# Patient Record
Sex: Female | Born: 1955 | Race: White | Hispanic: No | Smoking: Current every day smoker
Health system: Southern US, Community
[De-identification: ages and names within clinical notes are randomized; demographics above are authoritative.]

## PROBLEM LIST (undated history)

## (undated) DIAGNOSIS — D649 Anemia, unspecified: Secondary | ICD-10-CM

## (undated) DIAGNOSIS — J449 Chronic obstructive pulmonary disease, unspecified: Secondary | ICD-10-CM

## (undated) DIAGNOSIS — I2510 Atherosclerotic heart disease of native coronary artery without angina pectoris: Secondary | ICD-10-CM

## (undated) DIAGNOSIS — K219 Gastro-esophageal reflux disease without esophagitis: Secondary | ICD-10-CM

## (undated) DIAGNOSIS — I739 Peripheral vascular disease, unspecified: Secondary | ICD-10-CM

## (undated) DIAGNOSIS — F329 Major depressive disorder, single episode, unspecified: Secondary | ICD-10-CM

## (undated) DIAGNOSIS — E785 Hyperlipidemia, unspecified: Secondary | ICD-10-CM

## (undated) DIAGNOSIS — K2970 Gastritis, unspecified, without bleeding: Secondary | ICD-10-CM

## (undated) DIAGNOSIS — F32A Depression, unspecified: Secondary | ICD-10-CM

## (undated) DIAGNOSIS — F419 Anxiety disorder, unspecified: Secondary | ICD-10-CM

## (undated) DIAGNOSIS — I5032 Chronic diastolic (congestive) heart failure: Secondary | ICD-10-CM

## (undated) DIAGNOSIS — K649 Unspecified hemorrhoids: Secondary | ICD-10-CM

## (undated) DIAGNOSIS — I219 Acute myocardial infarction, unspecified: Secondary | ICD-10-CM

## (undated) DIAGNOSIS — I10 Essential (primary) hypertension: Secondary | ICD-10-CM

## (undated) DIAGNOSIS — I509 Heart failure, unspecified: Secondary | ICD-10-CM

## (undated) HISTORY — PX: APPENDECTOMY: SHX54

## (undated) HISTORY — PX: HIP FRACTURE SURGERY: SHX118

## (undated) HISTORY — DX: Heart failure, unspecified: I50.9

## (undated) HISTORY — DX: Chronic diastolic (congestive) heart failure: I50.32

## (undated) HISTORY — DX: Anemia, unspecified: D64.9

---

## 2007-05-01 ENCOUNTER — Other Ambulatory Visit: Payer: Self-pay

## 2007-05-01 ENCOUNTER — Inpatient Hospital Stay: Payer: Self-pay | Admitting: Internal Medicine

## 2008-07-08 DIAGNOSIS — I739 Peripheral vascular disease, unspecified: Secondary | ICD-10-CM

## 2008-07-08 HISTORY — PX: NM MYOVIEW LTD: HXRAD82

## 2008-07-08 HISTORY — DX: Peripheral vascular disease, unspecified: I73.9

## 2009-10-08 DIAGNOSIS — I2510 Atherosclerotic heart disease of native coronary artery without angina pectoris: Secondary | ICD-10-CM

## 2009-10-08 HISTORY — DX: Atherosclerotic heart disease of native coronary artery without angina pectoris: I25.10

## 2009-10-08 HISTORY — PX: LEFT HEART CATH AND CORONARY ANGIOGRAPHY: CATH118249

## 2009-11-07 HISTORY — PX: TRANSTHORACIC ECHOCARDIOGRAM: SHX275

## 2010-10-08 DIAGNOSIS — I6523 Occlusion and stenosis of bilateral carotid arteries: Secondary | ICD-10-CM

## 2010-10-08 HISTORY — DX: Occlusion and stenosis of bilateral carotid arteries: I65.23

## 2010-10-27 DIAGNOSIS — I771 Stricture of artery: Secondary | ICD-10-CM

## 2010-10-27 HISTORY — DX: Stricture of artery: I77.1

## 2011-03-28 HISTORY — PX: OTHER SURGICAL HISTORY: SHX169

## 2011-10-14 ENCOUNTER — Other Ambulatory Visit: Payer: Self-pay

## 2011-10-14 ENCOUNTER — Observation Stay (HOSPITAL_COMMUNITY)
Admission: EM | Admit: 2011-10-14 | Discharge: 2011-10-15 | Disposition: A | Discharge status: Home / Self Care | Payer: Medicaid Other | Attending: Internal Medicine | Admitting: Internal Medicine

## 2011-10-14 ENCOUNTER — Emergency Department (HOSPITAL_COMMUNITY): Payer: Medicaid Other

## 2011-10-14 ENCOUNTER — Encounter: Payer: Self-pay | Admitting: Emergency Medicine

## 2011-10-14 DIAGNOSIS — Z8249 Family history of ischemic heart disease and other diseases of the circulatory system: Secondary | ICD-10-CM | POA: Insufficient documentation

## 2011-10-14 DIAGNOSIS — J4489 Other specified chronic obstructive pulmonary disease: Secondary | ICD-10-CM | POA: Insufficient documentation

## 2011-10-14 DIAGNOSIS — E785 Hyperlipidemia, unspecified: Secondary | ICD-10-CM | POA: Insufficient documentation

## 2011-10-14 DIAGNOSIS — F17200 Nicotine dependence, unspecified, uncomplicated: Secondary | ICD-10-CM | POA: Insufficient documentation

## 2011-10-14 DIAGNOSIS — I739 Peripheral vascular disease, unspecified: Secondary | ICD-10-CM | POA: Insufficient documentation

## 2011-10-14 DIAGNOSIS — M79609 Pain in unspecified limb: Secondary | ICD-10-CM | POA: Insufficient documentation

## 2011-10-14 DIAGNOSIS — F32A Depression, unspecified: Secondary | ICD-10-CM | POA: Diagnosis present

## 2011-10-14 DIAGNOSIS — F341 Dysthymic disorder: Secondary | ICD-10-CM | POA: Insufficient documentation

## 2011-10-14 DIAGNOSIS — I200 Unstable angina: Principal | ICD-10-CM | POA: Insufficient documentation

## 2011-10-14 DIAGNOSIS — F329 Major depressive disorder, single episode, unspecified: Secondary | ICD-10-CM | POA: Diagnosis present

## 2011-10-14 DIAGNOSIS — Z23 Encounter for immunization: Secondary | ICD-10-CM | POA: Insufficient documentation

## 2011-10-14 DIAGNOSIS — J449 Chronic obstructive pulmonary disease, unspecified: Secondary | ICD-10-CM | POA: Diagnosis present

## 2011-10-14 DIAGNOSIS — I2510 Atherosclerotic heart disease of native coronary artery without angina pectoris: Secondary | ICD-10-CM | POA: Diagnosis present

## 2011-10-14 DIAGNOSIS — R0789 Other chest pain: Secondary | ICD-10-CM | POA: Insufficient documentation

## 2011-10-14 HISTORY — DX: Chronic obstructive pulmonary disease, unspecified: J44.9

## 2011-10-14 HISTORY — DX: Hyperlipidemia, unspecified: E78.5

## 2011-10-14 HISTORY — DX: Depression, unspecified: F32.A

## 2011-10-14 HISTORY — DX: Atherosclerotic heart disease of native coronary artery without angina pectoris: I25.10

## 2011-10-14 HISTORY — DX: Major depressive disorder, single episode, unspecified: F32.9

## 2011-10-14 HISTORY — DX: Peripheral vascular disease, unspecified: I73.9

## 2011-10-14 LAB — BASIC METABOLIC PANEL
Calcium: 10.1 mg/dL (ref 8.4–10.5)
Creatinine, Ser: 0.76 mg/dL (ref 0.50–1.10)
GFR calc Af Amer: >90 mL/min (ref >90)
GFR calc non Af Amer: >90 mL/min (ref >90)
Sodium: 136 mEq/L (ref 135–145)

## 2011-10-14 LAB — CBC
HCT: 38 % (ref 36.0–46.0)
MCH: 31.2 pg (ref 26.0–34.0)
MCHC: 33.9 g/dL (ref 30.0–36.0)
MCV: 91.8 fL (ref 78.0–100.0)
Platelets: 297 10*3/uL (ref 150–400)
RDW: 12.6 % (ref 11.5–15.5)
WBC: 10 10*3/uL (ref 4.0–10.5)

## 2011-10-14 LAB — POCT I-STAT TROPONIN I: Troponin i, poc: 0 ng/mL (ref 0.00–0.08)

## 2011-10-14 LAB — HEPATIC FUNCTION PANEL
ALT: 12 U/L (ref 0–35)
Alkaline Phosphatase: 95 U/L (ref 39–117)
Bilirubin, Direct: <0.1 mg/dL (ref 0.0–0.3)

## 2011-10-14 LAB — DIFFERENTIAL
Basophils Absolute: 0 10*3/uL (ref 0.0–0.1)
Basophils Relative: 0 % (ref 0–1)
Eosinophils Absolute: 0.2 10*3/uL (ref 0.0–0.7)
Eosinophils Relative: 2 % (ref 0–5)
Lymphs Abs: 3.2 10*3/uL (ref 0.7–4.0)

## 2011-10-14 MED ORDER — TRAZODONE HCL 50 MG PO TABS
50.0000 mg | ORAL_TABLET | Freq: Every day | ORAL | Status: DC
  Administered 2011-10-14: 50 mg via ORAL
  Filled 2011-10-14: qty 1

## 2011-10-14 MED ORDER — ONDANSETRON HCL 4 MG PO TABS: 4.0000 mg | ORAL_TABLET | Freq: Four times a day (QID) | ORAL | Status: DC | PRN

## 2011-10-14 MED ORDER — NICOTINE 14 MG/24HR TD PT24
14.0000 mg | MEDICATED_PATCH | Freq: Every day | TRANSDERMAL | Status: DC
  Administered 2011-10-15: 14 mg via TRANSDERMAL
  Filled 2011-10-14: qty 1

## 2011-10-14 MED ORDER — AMLODIPINE BESYLATE 5 MG PO TABS
5.0000 mg | ORAL_TABLET | Freq: Every day | ORAL | Status: DC
Start: 2011-10-15 — End: 2011-10-15
  Administered 2011-10-15: 5 mg via ORAL
  Filled 2011-10-14: qty 1

## 2011-10-14 MED ORDER — FOLIC ACID 1 MG PO TABS
1.0000 mg | ORAL_TABLET | Freq: Every day | ORAL | Status: DC
  Administered 2011-10-15: 1 mg via ORAL
  Filled 2011-10-14: qty 1

## 2011-10-14 MED ORDER — DOCUSATE SODIUM 100 MG PO CAPS: 100.0000 mg | ORAL_CAPSULE | Freq: Two times a day (BID) | ORAL | Status: DC | PRN

## 2011-10-14 MED ORDER — LISINOPRIL 10 MG PO TABS
20.0000 mg | ORAL_TABLET | Freq: Every day | ORAL | Status: DC
  Administered 2011-10-15: 20 mg via ORAL
  Filled 2011-10-14: qty 2

## 2011-10-14 MED ORDER — CARVEDILOL 12.5 MG PO TABS
12.5000 mg | ORAL_TABLET | Freq: Two times a day (BID) | ORAL | Status: DC
  Administered 2011-10-14 – 2011-10-15 (×2): 12.5 mg via ORAL
  Filled 2011-10-14 (×2): qty 1

## 2011-10-14 MED ORDER — ACETAMINOPHEN 325 MG PO TABS: 650.0000 mg | ORAL_TABLET | Freq: Four times a day (QID) | ORAL | Status: DC | PRN

## 2011-10-14 MED ORDER — ENOXAPARIN SODIUM 40 MG/0.4ML ~~LOC~~ SOLN
40.0000 mg | Freq: Every day | SUBCUTANEOUS | Status: DC
  Administered 2011-10-14: 40 mg via SUBCUTANEOUS
  Filled 2011-10-14: qty 0.4

## 2011-10-14 MED ORDER — OXYCODONE HCL 5 MG PO TABS
5.0000 mg | ORAL_TABLET | ORAL | Status: DC | PRN
  Administered 2011-10-14 – 2011-10-15 (×3): 5 mg via ORAL
  Filled 2011-10-14 (×3): qty 1

## 2011-10-14 MED ORDER — OXYCODONE-ACETAMINOPHEN 5-325 MG PO TABS
1.0000 | ORAL_TABLET | Freq: Once | ORAL | Status: AC
  Administered 2011-10-14: 1 via ORAL
  Filled 2011-10-14: qty 1

## 2011-10-14 MED ORDER — TRAZODONE HCL 50 MG PO TABS: 25.0000 mg | ORAL_TABLET | Freq: Every evening | ORAL | Status: DC | PRN

## 2011-10-14 MED ORDER — DIAZEPAM 5 MG PO TABS
5.0000 mg | ORAL_TABLET | Freq: Three times a day (TID) | ORAL | Status: DC | PRN
  Administered 2011-10-14 – 2011-10-15 (×2): 5 mg via ORAL
  Filled 2011-10-14 (×2): qty 1

## 2011-10-14 MED ORDER — PNEUMOCOCCAL VAC POLYVALENT 25 MCG/0.5ML IJ INJ
0.5000 mL | INJECTION | INTRAMUSCULAR | Status: AC
  Administered 2011-10-15: 0.5 mL via INTRAMUSCULAR
  Filled 2011-10-14: qty 0.5

## 2011-10-14 MED ORDER — ONDANSETRON HCL 4 MG/2ML IJ SOLN: 4.0000 mg | Freq: Four times a day (QID) | INTRAMUSCULAR | Status: DC | PRN

## 2011-10-14 MED ORDER — NITROGLYCERIN 0.4 MG SL SUBL
0.4000 mg | SUBLINGUAL_TABLET | Freq: Once | SUBLINGUAL | Status: AC
  Administered 2011-10-14: 0.4 mg via SUBLINGUAL
  Filled 2011-10-14: qty 25

## 2011-10-14 MED ORDER — SIMVASTATIN 20 MG PO TABS: 20.0000 mg | ORAL_TABLET | Freq: Every day | ORAL | Status: DC

## 2011-10-14 MED ORDER — HYDROMORPHONE HCL PF 1 MG/ML IJ SOLN: 0.5000 mg | INTRAMUSCULAR | Status: DC | PRN

## 2011-10-14 MED ORDER — ASPIRIN 81 MG PO CHEW
324.0000 mg | CHEWABLE_TABLET | Freq: Once | ORAL | Status: AC
  Administered 2011-10-14: 324 mg via ORAL
  Filled 2011-10-14: qty 4

## 2011-10-14 MED ORDER — ASPIRIN EC 81 MG PO TBEC
81.0000 mg | DELAYED_RELEASE_TABLET | Freq: Every day | ORAL | Status: DC
  Administered 2011-10-15: 81 mg via ORAL
  Filled 2011-10-14: qty 1

## 2011-10-14 MED ORDER — BISACODYL 10 MG RE SUPP: 10.0000 mg | RECTAL | Status: DC | PRN

## 2011-10-14 MED ORDER — ALBUTEROL SULFATE HFA 108 (90 BASE) MCG/ACT IN AERS: 2.0000 | INHALATION_SPRAY | Freq: Four times a day (QID) | RESPIRATORY_TRACT | Status: DC | PRN

## 2011-10-14 MED ORDER — FLEET ENEMA 7-19 GM/118ML RE ENEM: 1.0000 | ENEMA | RECTAL | Status: DC | PRN

## 2011-10-14 MED ORDER — ACETAMINOPHEN 650 MG RE SUPP: 650.0000 mg | Freq: Four times a day (QID) | RECTAL | Status: DC | PRN

## 2011-10-14 NOTE — H&P (Addendum)
PCP:  Dianna Rossetti, NP, [facility]  Cardiologist: Dr. Chales Abrahams at [facility]  Vascular surgeon: Dr. Fuller Canada at [facility]  Psychiatrist: Dr. Lynett Fish in [facility]  Chief Complaint:  Chest pain since today  HPI: Traci Duran is an 55 y.o. Caucasian female. History of coronary artery disease status post cardiac catheterization at [facility] 2 years ago, coronary artery stent was attempted but apparently failed, patient is on medical management. Patient also has a history of intermittent claudication and peripheral vascular disease and has been told that her arteries are too small for stenting.  Has been having pain in the left arm and hand for the past 2 weeks and was being treated unsuccessfully for tendinitis with oxycodone, then yesterday noticed that the pain came around her wrists and she suspects it may be cardiac pain, so took a sublingual nitroglycerin which gave prompt relief of the limb pain. Left upper extremity pain recurred later the same yesterday evening and she again took sublingual nitroglycerin which relieved the pain within about 5 minutes. n. this morning with left arm pain and chest pain and again it was relieved by nitroglycerin and when the chest pain occurred again later this evening she took nitroglycerin and then came to the emergency room. In the emergency room a chest pain almost completely resolved she received aspirin and more nitroglycerin and the pain resolved completely.  She denies any headache dizziness or lightheadedness she denies nausea vomiting or diaphoresis.  She does have COPD and recently has been using her albuterol inhaler, but says the pains are not related to the use of the inhaler. She has been unable to identify any other aggravating or relieving factors.  She continues to smoke cigarettes she is down from one pack per day to half a pack per day. She denies alcohol or illicit drug use.  She gets pains in her calf after  walking about 50 feet.   Past Medical History  Diagnosis Date  . Coronary artery disease   . PAD (peripheral artery disease)   . Depression   . COPD (chronic obstructive pulmonary disease)   . Hyperlipidemia     Past Surgical History  Procedure Date  . Appendectomy     Medications:  HOME MEDS: Prior to Admission medications   Medication Sig Start Date End Date Taking? Authorizing Provider  albuterol (VENTOLIN HFA) 108 (90 BASE) MCG/ACT inhaler Inhale 2 puffs into the lungs every 6 (six) hours as needed. For shortness of breath    Yes Historical Provider, MD  amLODipine (NORVASC) 5 MG tablet Take 5 mg by mouth daily.     Yes Historical Provider, MD  aspirin EC 81 MG tablet Take 81 mg by mouth daily.     Yes Historical Provider, MD  carvedilol (COREG) 12.5 MG tablet Take 12.5 mg by mouth 2 (two) times daily.     Yes Historical Provider, MD  diazepam (VALIUM) 5 MG tablet Take 5 mg by mouth 3 (three) times daily.     Yes Historical Provider, MD  gabapentin (NEURONTIN) 300 MG capsule Take 300 mg by mouth 3 (three) times daily.     Yes Historical Provider, MD  lisinopril (PRINIVIL,ZESTRIL) 20 MG tablet Take 20 mg by mouth daily.     Yes Historical Provider, MD  nicotine (NICODERM CQ - DOSED IN MG/24 HR) 7 mg/24hr patch Place 1 patch onto the skin daily.     Yes Historical Provider, MD  oxyCODONE-acetaminophen (PERCOCET) 5-325 MG per tablet Take 1  tablet by mouth every 6 (six) hours as needed. For pain    Yes Historical Provider, MD  simvastatin (ZOCOR) 20 MG tablet Take 20 mg by mouth daily.     Yes Historical Provider, MD  traZODone (DESYREL) 50 MG tablet Take 50 mg by mouth at bedtime.     Yes Historical Provider, MD    PRIOR TO AMDISSION MEDS Medications Prior to Admission  Medication Dose Route Frequency Provider Last Rate Last Dose  . aspirin chewable tablet 324 mg  324 mg Oral Once Joya Gaskins, MD   324 mg at 10/14/11 1827  . diazepam (VALIUM) tablet 5 mg  5 mg Oral Q8H  PRN Vania Rea   5 mg at 10/14/11 2133  . nitroGLYCERIN (NITROSTAT) SL tablet 0.4 mg  0.4 mg Sublingual Once Joya Gaskins, MD   0.4 mg at 10/14/11 1828  . oxyCODONE-acetaminophen (PERCOCET) 5-325 MG per tablet 1 tablet  1 tablet Oral Once Joya Gaskins, MD   1 tablet at 10/14/11 1938   No current outpatient prescriptions on file as of 10/14/2011.    Allergies:  Allergies  Allergen Reactions  . Benadryl (Altaryl) Rash    Social History:   reports that she has been smoking Cigarettes.  She has a 10 pack-year smoking history. She has never used smokeless tobacco. She reports that she drinks alcohol. She reports that she does not use illicit drugs.  Family History: Family History  Problem Relation Age of Onset  . Hypertension Mother   . Heart failure Mother   . Heart failure Father     Rewiew of Systems:  The patient denies anorexia, fever, weight loss,, vision loss, decreased hearing, hoarseness,  syncope, dyspnea on exertion, peripheral edema, balance deficits, hemoptysis, abdominal pain, melena, hematochezia, severe indigestion/heartburn, hematuria, incontinence, genital sores, muscle weakness, suspicious skin lesions, transient blindness, difficulty walking, depression, unusual weight change, abnormal bleeding, enlarged lymph nodes, angioedema, and breast masses.  Physical Exam: Filed Vitals:   10/14/11 1929 10/14/11 2116 10/14/11 2125 10/14/11 2149  BP: 129/79 180/110 183/95 158/84  Pulse: 61 68 69 60  Temp:   98 F (36.7 C)   TempSrc:   Oral   Resp: 20 15    Height:      Weight:      SpO2: 96% 98% 96%    Blood pressure 158/84, pulse 60, temperature 98 F (36.7 C), temperature source Oral, resp. rate 15, height 5\' 7"  (1.702 m), weight 59.875 kg (132 lb), SpO2 96.00%.  GEN: Pleasant middle-aged Caucasian lady lying in the stretcher in no acute distress; cooperative with exam PSYCH: He is alert and oriented x4; does appear somewhat anxious; does not appear  depressed; affect is normal HEENT: Mucous membranes pink and anicteric; PERRLA; EOM intact; no cervical lymphadenopathy nor thyromegaly ; no JVD; left carotid bruit Breasts:: Not examined CHEST WALL: No tenderness CHEST: Normal respiration, clear to auscultation bilaterally HEART: Regular rate and rhythm; 3/6 systolic murmu,r no gallops BACK: No kyphosis or scoliosis; no CVA tenderness ABDOMEN: Obese, soft non-tender; no masses, no organomegaly, normal abdominal bowel sounds; no pannus; no intertriginous candida. Rectal Exam: Not done EXTREMITIES: ; age-appropriate arthropathy of the hands and knees; no edema; no ulcerations. Genitalia: not examined PULSES: 2+ and symmetric SKIN: Normal hydration no rash or ulceration CNS: Cranial nerves 2-12 grossly intact no focal neurologic deficit   Labs & Imaging Results for orders placed during the hospital encounter of 10/14/11 (from the past 48 hour(s))  CBC  Status: Normal   Collection Time   10/14/11  6:05 PM      Component Value Range Comment   WBC 10.0  4.0 - 10.5 (K/uL)    RBC 4.14  3.87 - 5.11 (MIL/uL)    Hemoglobin 12.9  12.0 - 15.0 (g/dL)    HCT 16.1  09.6 - 04.5 (%)    MCV 91.8  78.0 - 100.0 (fL)    MCH 31.2  26.0 - 34.0 (pg)    MCHC 33.9  30.0 - 36.0 (g/dL)    RDW 40.9  81.1 - 91.4 (%)    Platelets 297  150 - 400 (K/uL)   DIFFERENTIAL     Status: Normal   Collection Time   10/14/11  6:05 PM      Component Value Range Comment   Neutrophils Relative 59  43 - 77 (%)    Neutro Abs 5.9  1.7 - 7.7 (K/uL)    Lymphocytes Relative 32  12 - 46 (%)    Lymphs Abs 3.2  0.7 - 4.0 (K/uL)    Monocytes Relative 7  3 - 12 (%)    Monocytes Absolute 0.7  0.1 - 1.0 (K/uL)    Eosinophils Relative 2  0 - 5 (%)    Eosinophils Absolute 0.2  0.0 - 0.7 (K/uL)    Basophils Relative 0  0 - 1 (%)    Basophils Absolute 0.0  0.0 - 0.1 (K/uL)   BASIC METABOLIC PANEL     Status: Normal   Collection Time   10/14/11  6:05 PM      Component Value Range  Comment   Sodium 136  135 - 145 (mEq/L)    Potassium 4.0  3.5 - 5.1 (mEq/L)    Chloride 103  96 - 112 (mEq/L)    CO2 25  19 - 32 (mEq/L)    Glucose, Bld 91  70 - 99 (mg/dL)    BUN 10  6 - 23 (mg/dL)    Creatinine, Ser 7.82  0.50 - 1.10 (mg/dL)    Calcium 95.6  8.4 - 10.5 (mg/dL)    GFR calc non Af Amer >90  >90 (mL/min)    GFR calc Af Amer >90  >90 (mL/min)   POCT I-STAT TROPONIN I     Status: Normal   Collection Time   10/14/11  6:24 PM      Component Value Range Comment   Troponin i, poc 0.00  0.00 - 0.08 (ng/mL)    Comment 3             Dg Chest 2 View  10/14/2011  *RADIOLOGY REPORT*  Clinical Data: Left arm pain  CHEST - 2 VIEW  Comparison: None.  Findings: Normal mediastinum and cardiac silhouette.  Normal pulmonary  vasculature.  No evidence of effusion, infiltrate, or pneumothorax.  No acute bony abnormality.  IMPRESSION: No acute cardiopulmonary process.  Original Report Authenticated By: Genevive Bi, M.D.      Assessment Present on Admission:  .Unstable angina .Coronary artery disease .PAD (peripheral artery disease) .Hyperlipidemia .Depression/ Anxiety .COPD (chronic obstructive pulmonary disease) Tobacco abuse  PLAN: Bring this lady on observation to rule out acute cardiac damage. Have discussed the possibility of bypass surgery if she continues to be symptomatic it is not amenable to angioplasty. She would prefer to have further [facility] done at [facility] if possible. We'll consult a cardiologist for assistance with management.  Other plans as per orders.   Yanique Mulvihill 10/14/2011, 10:14 PM

## 2011-10-14 NOTE — ED Notes (Signed)
Pt reporting some improvement in pain following administration of pain medication.

## 2011-10-14 NOTE — ED Notes (Signed)
[facility] to obtain records for Pt.  Consent obtained and faxed to them.

## 2011-10-14 NOTE — ED Notes (Signed)
Pt reporting improvement in arm pain and headache.  BP improved at this time.  Pt denies any CP or SOB.

## 2011-10-14 NOTE — ED Notes (Signed)
Pt reporting pain in head and arm. Denies any CP at this time. Denies nausea at present.

## 2011-10-14 NOTE — ED Provider Notes (Signed)
History  Scribed for Traci Gaskins, MD, the patient was seen in [facility]. The chart was scribed by Gilman Schmidt. The patients care was started at 5:44 PM.  CSN: [id number] Arrival date & time: 10/14/2011  5:23 PM   First MD Initiated Contact with Patient 10/14/11 1733      Chief Complaint  Patient presents with  . Chest Pain    HPI Traci Duran is a 55 y.o. female with a history of CAD, PAD, DM, COPD, and Depression who presents to the Emergency Department complaining of chest pain. Pt reports chest pain is radiating to left arm onset yesterday. States she took nitroglycerin with relief. Also reports onset of same symptoms this morning and notes that later in afternoon the chest pain was sharp (lasted 1 minute). Symptoms are exacerbated upon exertion. Denies any exacerbation upon deep breathing. Associated sx of SOB, and vomiting. Denies any diaphoresis or weakness in arm. Pt took 81 mg ASA. Denies any current chest pain but notes arm pain still persists. Also, notes heart cath two years ago and that arteries were too small for stent placement. There are no other associated symptoms and no other alleviating or aggravating factors.   Cardiologist: [facility] PCP: [facility]    Past Medical History  Diagnosis Date  . Coronary artery disease   . PAD (peripheral artery disease)   . Diabetes mellitus   . Depression    Past Surgical History  Procedure Date  . Appendectomy     Family History  Problem Relation Age of Onset  . Hypertension Mother   . Heart failure Mother   . Heart failure Father     History  Substance Use Topics  . Smoking status: Current Everyday Smoker -- 0.5 packs/day for 20 years    Types: Cigarettes  . Smokeless tobacco: Never Used  . Alcohol Use: Yes     occasional    OB History    Grav Para Term Preterm Abortions TAB SAB Ect Mult Living   4 3 3  1  1   3       Review of Systems  Constitutional: Negative for diaphoresis.  Respiratory:  Positive for shortness of breath.   Cardiovascular: Positive for chest pain.  Gastrointestinal: Positive for vomiting.  Musculoskeletal:       Arm pain  Neurological: Negative for weakness.  All other systems reviewed and are negative.    Allergies  Benadryl  Home Medications  No current outpatient prescriptions on file.  BP 174/109  Pulse 66  Temp(Src) 97.8 F (36.6 C) (Oral)  Resp 18  Ht 5\' 7"  (1.702 m)  Wt 132 lb (59.875 kg)  BMI 20.67 kg/m2  SpO2 97% BP 129/79  Pulse 61  Temp(Src) 97.8 F (36.6 C) (Oral)  Resp 20  Ht 5\' 7"  (1.702 m)  Wt 132 lb (59.875 kg)  BMI 20.67 kg/m2  SpO2 96%   Physical Exam CONSTITUTIONAL: Well developed/well nourished HEAD AND FACE: Normocephalic/atraumatic EYES: EOMI/PERRL ENMT: Mucous membranes moist NECK: supple no meningeal signs CV: S1/S2 noted, no murmurs/rubs/gallops noted LUNGS: Lungs are clear to auscultation bilaterally, no apparent distress ABDOMEN: soft, nontender, no rebound or guarding NEURO: Pt is awake/alert, moves all extremitiesx4 EXTREMITIES: pulses normal/equal, full ROM, no edema, no deformity SKIN: warm, color normal PSYCH: no abnormalities of mood noted   ED Course  Procedures  DIAGNOSTIC STUDIES: Oxygen Saturation is 97% on room air, normal by my interpretation.    COORDINATION OF CARE: 5:44PM:  - Patient evaluated by ED  physician, DG Chest, ASA, Nitroglycerin, CBC, Diff, BMP, i-stat ordered   Results for orders placed during the hospital encounter of 10/14/11  CBC      Component Value Range   WBC 10.0  4.0 - 10.5 (K/uL)   RBC 4.14  3.87 - 5.11 (MIL/uL)   Hemoglobin 12.9  12.0 - 15.0 (g/dL)   HCT 40.9  81.1 - 91.4 (%)   MCV 91.8  78.0 - 100.0 (fL)   MCH 31.2  26.0 - 34.0 (pg)   MCHC 33.9  30.0 - 36.0 (g/dL)   RDW 78.2  95.6 - 21.3 (%)   Platelets 297  150 - 400 (K/uL)  DIFFERENTIAL      Component Value Range   Neutrophils Relative 59  43 - 77 (%)   Neutro Abs 5.9  1.7 - 7.7 (K/uL)    Lymphocytes Relative 32  12 - 46 (%)   Lymphs Abs 3.2  0.7 - 4.0 (K/uL)   Monocytes Relative 7  3 - 12 (%)   Monocytes Absolute 0.7  0.1 - 1.0 (K/uL)   Eosinophils Relative 2  0 - 5 (%)   Eosinophils Absolute 0.2  0.0 - 0.7 (K/uL)   Basophils Relative 0  0 - 1 (%)   Basophils Absolute 0.0  0.0 - 0.1 (K/uL)  BASIC METABOLIC PANEL      Component Value Range   Sodium 136  135 - 145 (mEq/L)   Potassium 4.0  3.5 - 5.1 (mEq/L)   Chloride 103  96 - 112 (mEq/L)   CO2 25  19 - 32 (mEq/L)   Glucose, Bld 91  70 - 99 (mg/dL)   BUN 10  6 - 23 (mg/dL)   Creatinine, Ser 0.86  0.50 - 1.10 (mg/dL)   Calcium 57.8  8.4 - 10.5 (mg/dL)   GFR calc non Af Amer >90  >90 (mL/min)   GFR calc Af Amer >90  >90 (mL/min)  POCT I-STAT TROPONIN I      Component Value Range   Troponin i, poc 0.00  0.00 - 0.08 (ng/mL)   Comment 3              DG Chest 2 View. Reviewed by me. IMPRESSION: No acute cardiopulmonary process. Original Report Authenticated By: Genevive Bi, M.D.  8:53 PM Records from [facility] not received as of yet Pt reports h/o CAD, suspect she has diffuse CAD and given h/o chest pain/sob will admit for observation.  This is the first episode of CP she has had since last cath.  Will defer heparin at this time Pt received ASA   Doubt PE/dissection at this time.  Pt is improved at this time D/w dr Orvan Falconer, will admit   MDM  Nursing notes reviewed and considered in documentation All labs/vitals reviewed and considered xrays reviewed and considered     Date: 10/14/2011  Rate: 65  Rhythm: normal sinus rhythm  QRS Axis: normal  Intervals: normal  ST/T Wave abnormalities: normal  Conduction Disutrbances:none  Narrative Interpretation:   Old EKG Reviewed: none available    I personally performed the services described in this documentation, which was scribed in my presence. The recorded information has been reviewed and considered.         Traci Gaskins, MD 10/14/11 2055

## 2011-10-14 NOTE — ED Notes (Signed)
Pt states that her chest pain is gone but her left arm is still hurting. Pt also c/o headache.

## 2011-10-14 NOTE — ED Notes (Signed)
Per patient left chest pain that radiates into left arm Per pt arm pain began first. Patient reports using nitroglycerin with some relief.

## 2011-10-15 ENCOUNTER — Encounter (HOSPITAL_COMMUNITY): Payer: Self-pay | Admitting: Physician Assistant

## 2011-10-15 DIAGNOSIS — R079 Chest pain, unspecified: Secondary | ICD-10-CM

## 2011-10-15 LAB — CARDIAC PANEL(CRET KIN+CKTOT+MB+TROPI)
CK, MB: 2 ng/mL (ref 0.3–4.0)
CK, MB: 2.2 ng/mL (ref 0.3–4.0)
Troponin I: <0.3 ng/mL (ref <0.30)

## 2011-10-15 LAB — HEMOGLOBIN A1C
Hgb A1c MFr Bld: 5.2 % (ref <5.7)
Mean Plasma Glucose: 103 mg/dL (ref <117)

## 2011-10-15 LAB — URINALYSIS, ROUTINE W REFLEX MICROSCOPIC
Bilirubin Urine: NEGATIVE
Ketones, ur: NEGATIVE mg/dL
Nitrite: NEGATIVE
Specific Gravity, Urine: 1.01 (ref 1.005–1.030)
Urobilinogen, UA: 0.2 mg/dL (ref 0.0–1.0)
pH: 6 (ref 5.0–8.0)

## 2011-10-15 LAB — BASIC METABOLIC PANEL
CO2: 26 mEq/L (ref 19–32)
Calcium: 9.7 mg/dL (ref 8.4–10.5)
Chloride: 105 mEq/L (ref 96–112)
Creatinine, Ser: 0.81 mg/dL (ref 0.50–1.10)
GFR calc Af Amer: >90 mL/min (ref >90)
Glucose, Bld: 77 mg/dL (ref 70–99)
Sodium: 138 mEq/L (ref 135–145)

## 2011-10-15 LAB — CBC
HCT: 36.2 % (ref 36.0–46.0)
MCV: 91.4 fL (ref 78.0–100.0)
Platelets: 240 10*3/uL (ref 150–400)
RBC: 3.96 MIL/uL (ref 3.87–5.11)
WBC: 9.2 10*3/uL (ref 4.0–10.5)

## 2011-10-15 LAB — URINE MICROSCOPIC-ADD ON

## 2011-10-15 LAB — LIPID PANEL
Cholesterol: 214 mg/dL — ABNORMAL HIGH (ref 0–200)
HDL: 45 mg/dL (ref >39)
LDL Cholesterol: 124 mg/dL — ABNORMAL HIGH (ref 0–99)
Total CHOL/HDL Ratio: 4.8 RATIO
Triglycerides: 225 mg/dL — ABNORMAL HIGH (ref <150)
VLDL: 45 mg/dL — ABNORMAL HIGH (ref 0–40)

## 2011-10-15 LAB — TSH: TSH: 1.084 u[IU]/mL (ref 0.350–4.500)

## 2011-10-15 NOTE — Progress Notes (Signed)
CSW received referral for transportation needs.  Pt reports she has no ride home and no money to assist with transportation.  CSW arranged ride via [employer].  RN will call cab when pt is ready to leave.  No other needs reported.  CSW to sign off.  Karn Cassis

## 2011-10-15 NOTE — Discharge Summary (Signed)
Physician Discharge Summary  Patient ID: Traci Duran MRN: [id number] DOB/AGE: 55-May-1957 55 y.o.  Admit date: 10/14/2011 Discharge date: 10/15/2011  Discharge Diagnoses:  Principal Problem:  *Unstable angina Active Problems:  Coronary artery disease  COPD (chronic obstructive pulmonary disease)  Hyperlipidemia  Depression  PAD (peripheral artery disease)   Current Discharge Medication List    CONTINUE these medications which have NOT CHANGED   Details  albuterol (VENTOLIN HFA) 108 (90 BASE) MCG/ACT inhaler Inhale 2 puffs into the lungs every 6 (six) hours as needed. For shortness of breath     amLODipine (NORVASC) 5 MG tablet Take 5 mg by mouth daily.      aspirin EC 81 MG tablet Take 81 mg by mouth daily.      carvedilol (COREG) 12.5 MG tablet Take 12.5 mg by mouth 2 (two) times daily.      diazepam (VALIUM) 5 MG tablet Take 5 mg by mouth 3 (three) times daily.      gabapentin (NEURONTIN) 300 MG capsule Take 300 mg by mouth 3 (three) times daily.      lisinopril (PRINIVIL,ZESTRIL) 20 MG tablet Take 20 mg by mouth daily.      nicotine (NICODERM CQ - DOSED IN MG/24 HR) 7 mg/24hr patch Place 1 patch onto the skin daily.      oxyCODONE-acetaminophen (PERCOCET) 5-325 MG per tablet Take 1 tablet by mouth every 6 (six) hours as needed. For pain     simvastatin (ZOCOR) 20 MG tablet Take 20 mg by mouth daily.      traZODone (DESYREL) 50 MG tablet Take 50 mg by mouth at bedtime.          Discharge Orders    Future Orders Please Complete By Expires   Diet - low sodium heart healthy      Discharge instructions      Comments:   Quit smoking   Activity as tolerated - No restrictions         Follow-up Information    Please follow up. (tomorrow for stress test)          Disposition: Home  Discharged Condition: Stable  Consults: Treatment Team:  Gerrit Friends. Dietrich Pates, MD  Labs:   Results for orders placed during the hospital encounter of 10/14/11 (from the past  48 hour(s))  CBC     Status: Normal   Collection Time   10/14/11  6:05 PM      Component Value Range Comment   WBC 10.0  4.0 - 10.5 (K/uL)    RBC 4.14  3.87 - 5.11 (MIL/uL)    Hemoglobin 12.9  12.0 - 15.0 (g/dL)    HCT 41.6  60.6 - 30.1 (%)    MCV 91.8  78.0 - 100.0 (fL)    MCH 31.2  26.0 - 34.0 (pg)    MCHC 33.9  30.0 - 36.0 (g/dL)    RDW 60.1  09.3 - 23.5 (%)    Platelets 297  150 - 400 (K/uL)   DIFFERENTIAL     Status: Normal   Collection Time   10/14/11  6:05 PM      Component Value Range Comment   Neutrophils Relative 59  43 - 77 (%)    Neutro Abs 5.9  1.7 - 7.7 (K/uL)    Lymphocytes Relative 32  12 - 46 (%)    Lymphs Abs 3.2  0.7 - 4.0 (K/uL)    Monocytes Relative 7  3 - 12 (%)    Monocytes Absolute 0.7  0.1 - 1.0 (K/uL)    Eosinophils Relative 2  0 - 5 (%)    Eosinophils Absolute 0.2  0.0 - 0.7 (K/uL)    Basophils Relative 0  0 - 1 (%)    Basophils Absolute 0.0  0.0 - 0.1 (K/uL)   BASIC METABOLIC PANEL     Status: Normal   Collection Time   10/14/11  6:05 PM      Component Value Range Comment   Sodium 136  135 - 145 (mEq/L)    Potassium 4.0  3.5 - 5.1 (mEq/L)    Chloride 103  96 - 112 (mEq/L)    CO2 25  19 - 32 (mEq/L)    Glucose, Bld 91  70 - 99 (mg/dL)    BUN 10  6 - 23 (mg/dL)    Creatinine, Ser 1.91  0.50 - 1.10 (mg/dL)    Calcium 47.8  8.4 - 10.5 (mg/dL)    GFR calc non Af Amer >90  >90 (mL/min)    GFR calc Af Amer >90  >90 (mL/min)   HEPATIC FUNCTION PANEL     Status: Abnormal   Collection Time   10/14/11  6:05 PM      Component Value Range Comment   Total Protein 7.5  6.0 - 8.3 (g/dL)    Albumin 3.7  3.5 - 5.2 (g/dL)    AST 19  0 - 37 (U/L)    ALT 12  0 - 35 (U/L)    Alkaline Phosphatase 95  39 - 117 (U/L)    Total Bilirubin 0.1 (*) 0.3 - 1.2 (mg/dL)    Bilirubin, Direct <2.9  0.0 - 0.3 (mg/dL)    Indirect Bilirubin NOT CALCULATED  0.3 - 0.9 (mg/dL)   POCT I-STAT TROPONIN I     Status: Normal   Collection Time   10/14/11  6:24 PM      Component Value  Range Comment   Troponin i, poc 0.00  0.00 - 0.08 (ng/mL)    Comment 3            CARDIAC PANEL(CRET KIN+CKTOT+MB+TROPI)     Status: Normal   Collection Time   10/14/11 11:08 PM      Component Value Range Comment   Total CK 33  7 - 177 (U/L)    CK, MB 2.0  0.3 - 4.0 (ng/mL)    Troponin I <0.30  <0.30 (ng/mL)    Relative Index RELATIVE INDEX IS INVALID  0.0 - 2.5    CARDIAC PANEL(CRET KIN+CKTOT+MB+TROPI)     Status: Normal   Collection Time   10/15/11  5:06 AM      Component Value Range Comment   Total CK 31  7 - 177 (U/L)    CK, MB 2.2  0.3 - 4.0 (ng/mL)    Troponin I <0.30  <0.30 (ng/mL)    Relative Index RELATIVE INDEX IS INVALID  0.0 - 2.5    LIPID PANEL     Status: Abnormal   Collection Time   10/15/11  5:06 AM      Component Value Range Comment   Cholesterol 214 (*) 0 - 200 (mg/dL)    Triglycerides 562 (*) <150 (mg/dL)    HDL 45  >13 (mg/dL)    Total CHOL/HDL Ratio 4.8      VLDL 45 (*) 0 - 40 (mg/dL)    LDL Cholesterol 086 (*) 0 - 99 (mg/dL)   BASIC METABOLIC PANEL     Status: Abnormal   Collection Time  10/15/11  5:11 AM      Component Value Range Comment   Sodium 138  135 - 145 (mEq/L)    Potassium 3.9  3.5 - 5.1 (mEq/L)    Chloride 105  96 - 112 (mEq/L)    CO2 26  19 - 32 (mEq/L)    Glucose, Bld 77  70 - 99 (mg/dL)    BUN 10  6 - 23 (mg/dL)    Creatinine, Ser 6.96  0.50 - 1.10 (mg/dL)    Calcium 9.7  8.4 - 10.5 (mg/dL)    GFR calc non Af Amer 81 (*) >90 (mL/min)    GFR calc Af Amer >90  >90 (mL/min)   CBC     Status: Normal   Collection Time   10/15/11  5:11 AM      Component Value Range Comment   WBC 9.2  4.0 - 10.5 (K/uL)    RBC 3.96  3.87 - 5.11 (MIL/uL)    Hemoglobin 12.3  12.0 - 15.0 (g/dL)    HCT 29.5  28.4 - 13.2 (%)    MCV 91.4  78.0 - 100.0 (fL)    MCH 31.1  26.0 - 34.0 (pg)    MCHC 34.0  30.0 - 36.0 (g/dL)    RDW 44.0  10.2 - 72.5 (%)    Platelets 240  150 - 400 (K/uL)   URINALYSIS, ROUTINE W REFLEX MICROSCOPIC     Status: Abnormal   Collection  Time   10/15/11  8:40 AM      Component Value Range Comment   Color, Urine YELLOW  YELLOW     Appearance CLEAR  CLEAR     Specific Gravity, Urine 1.010  1.005 - 1.030     pH 6.0  5.0 - 8.0     Glucose, UA NEGATIVE  NEGATIVE (mg/dL)    Hgb urine dipstick NEGATIVE  NEGATIVE     Bilirubin Urine NEGATIVE  NEGATIVE     Ketones, ur NEGATIVE  NEGATIVE (mg/dL)    Protein, ur NEGATIVE  NEGATIVE (mg/dL)    Urobilinogen, UA 0.2  0.0 - 1.0 (mg/dL)    Nitrite NEGATIVE  NEGATIVE     Leukocytes, UA TRACE (*) NEGATIVE    URINE MICROSCOPIC-ADD ON     Status: Abnormal   Collection Time   10/15/11  8:40 AM      Component Value Range Comment   Squamous Epithelial / LPF MANY (*) RARE     WBC, UA 0-2  <3 (WBC/hpf)    Bacteria, UA MANY (*) RARE      Diagnostics:  Dg Chest 2 View  10/14/2011  *RADIOLOGY REPORT*  Clinical Data: Left arm pain  CHEST - 2 VIEW  Comparison: None.  Findings: Normal mediastinum and cardiac silhouette.  Normal pulmonary  vasculature.  No evidence of effusion, infiltrate, or pneumothorax.  No acute bony abnormality.  IMPRESSION: No acute cardiopulmonary process.  Original Report Authenticated By: Genevive Bi, M.D.   EKG:  NSR  Procedures: None  Full Code   Hospital Course:  See H&P for admission details.  Patient is a 55 year old white female with a history of heart disease who presents with atypical chest discomfort. She had pain in her arm several times that resolved with nitroglycerin. She also had pain in her arm that accompanied pain in her chest that was relieved by nitroglycerin. She has a history of heart disease and had similar symptoms when she had a stent placed at [facility]. Her EKG  and cardiac enzymes have been negative. Cardiology has been consulted and will do an outpatient stress test tomorrow. The patient is encouraged to quit smoking. She is currently trying the patches and is motivated. Her other medical problems are stable. Her vital signs and  physical examination have been unremarkable. She's had no further discomfort.  Discharge Exam:  Blood pressure 119/73, pulse 62, temperature 98.4 F (36.9 C), temperature source Oral, resp. rate 18, height 5\' 7"  (1.702 m), weight 58.8 kg (129 lb 10.1 oz), SpO2 98.00%.  General: Comfortable. Smells of tobacco smoke Lungs CTA without RRR CV: RRR without MGR Abd:  S, NT, ND Ext: no CCE  Signed: Cornelious Bartolucci L 10/15/2011, 1:18 PM

## 2011-10-15 NOTE — Consult Note (Signed)
Reason for Consult:chest pain Referring Physician:[place] Cardiologist-[facility]  Traci Duran is an 55 y.o. female.  HPI: This is a 55 year old female patient who presents with two-week history of left arm and hand pain that was being treated unsuccessfully for tendinitis. She continued to have left hand pain that turned into chest pain and was relieved with sublingual nitroglycerin. The pain is described as a sharp shooting pain from her left shoulder across her chest. It is similar to her pain she had 2 years ago when she presented to [facility].  Discomfort lasted for approximately 1 minute and apparently responded to sublingual nitroglycerin. Patient has some intermittent chest discomfort, but this was much more intense prompting her to seek medical attention. There was some associated dyspnea, but no diaphoresis nor nausea.  Patient has a history of coronary artery disease status post catheter at [facility] 2 years ago with unsuccessful stent placement because of small arteries. Medical records have been requested and are pending.  She was treated medically. She continues to be followed at [facility] every 3 months by Dr. Allena Katz. She had some type of nuclear study one year ago that was okay. She also has significant peripheral arterial disease and is scheduled for followup with Dr. Tressie Stalker in 2 weeks for possible surgery on her left lower leg.  She continues to smoke a half pack of cigarettes daily. She also has hypertension and hyperlipidemia as well as a strong family history of CAD.  Past Medical History  Diagnosis Date  . Coronary artery disease   . PAD (peripheral artery disease)   . Depression   . COPD (chronic obstructive pulmonary disease)   . Hyperlipidemia     Past Surgical History  Procedure Date  . Appendectomy     Family History  Problem Relation Age of Onset  . Heart failure Mother   . Heart attack Mother     mother died at 27 of an MI  . Heart attack  Father     father died at 52 of an MI  . Heart attack Brother     Brother had an MI in his 61s    Social History:  reports that she has been smoking Cigarettes.  She has a 10 pack-year smoking history. She has never used smokeless tobacco. She reports that she drinks alcohol. She reports that she does not use illicit drugs.  Allergies:  Allergies  Allergen Reactions  . Benadryl (Altaryl) Rash    Medications:  Results for orders placed during the hospital encounter of 10/14/11 (from the past 48 hour(s))  CBC     Status: Normal   Collection Time   10/14/11  6:05 PM      Component Value Range Comment   WBC 10.0  4.0 - 10.5 (K/uL)    RBC 4.14  3.87 - 5.11 (MIL/uL)    Hemoglobin 12.9  12.0 - 15.0 (g/dL)    HCT 16.1  09.6 - 04.5 (%)    MCV 91.8  78.0 - 100.0 (fL)    MCH 31.2  26.0 - 34.0 (pg)    MCHC 33.9  30.0 - 36.0 (g/dL)    RDW 40.9  81.1 - 91.4 (%)    Platelets 297  150 - 400 (K/uL)   DIFFERENTIAL     Status: Normal   Collection Time   10/14/11  6:05 PM      Component Value Range Comment   Neutrophils Relative 59  43 - 77 (%)    Neutro Abs  5.9  1.7 - 7.7 (K/uL)    Lymphocytes Relative 32  12 - 46 (%)    Lymphs Abs 3.2  0.7 - 4.0 (K/uL)    Monocytes Relative 7  3 - 12 (%)    Monocytes Absolute 0.7  0.1 - 1.0 (K/uL)    Eosinophils Relative 2  0 - 5 (%)    Eosinophils Absolute 0.2  0.0 - 0.7 (K/uL)    Basophils Relative 0  0 - 1 (%)    Basophils Absolute 0.0  0.0 - 0.1 (K/uL)   BASIC METABOLIC PANEL     Status: Normal   Collection Time   10/14/11  6:05 PM      Component Value Range Comment   Sodium 136  135 - 145 (mEq/L)    Potassium 4.0  3.5 - 5.1 (mEq/L)    Chloride 103  96 - 112 (mEq/L)    CO2 25  19 - 32 (mEq/L)    Glucose, Bld 91  70 - 99 (mg/dL)    BUN 10  6 - 23 (mg/dL)    Creatinine, Ser 1.61  0.50 - 1.10 (mg/dL)    Calcium 09.6  8.4 - 10.5 (mg/dL)    GFR calc non Af Amer >90  >90 (mL/min)    GFR calc Af Amer >90  >90 (mL/min)   HEPATIC FUNCTION PANEL      Status: Abnormal   Collection Time   10/14/11  6:05 PM      Component Value Range Comment   Total Protein 7.5  6.0 - 8.3 (g/dL)    Albumin 3.7  3.5 - 5.2 (g/dL)    AST 19  0 - 37 (U/L)    ALT 12  0 - 35 (U/L)    Alkaline Phosphatase 95  39 - 117 (U/L)    Total Bilirubin 0.1 (*) 0.3 - 1.2 (mg/dL)    Bilirubin, Direct <0.4  0.0 - 0.3 (mg/dL)    Indirect Bilirubin NOT CALCULATED  0.3 - 0.9 (mg/dL)   POCT I-STAT TROPONIN I     Status: Normal   Collection Time   10/14/11  6:24 PM      Component Value Range Comment   Troponin i, poc 0.00  0.00 - 0.08 (ng/mL)    Comment 3            CARDIAC PANEL(CRET KIN+CKTOT+MB+TROPI)     Status: Normal   Collection Time   10/14/11 11:08 PM      Component Value Range Comment   Total CK 33  7 - 177 (U/L)    CK, MB 2.0  0.3 - 4.0 (ng/mL)    Troponin I <0.30  <0.30 (ng/mL)    Relative Index RELATIVE INDEX IS INVALID  0.0 - 2.5    CARDIAC PANEL(CRET KIN+CKTOT+MB+TROPI)     Status: Normal   Collection Time   10/15/11  5:06 AM      Component Value Range Comment   Total CK 31  7 - 177 (U/L)    CK, MB 2.2  0.3 - 4.0 (ng/mL)    Troponin I <0.30  <0.30 (ng/mL)    Relative Index RELATIVE INDEX IS INVALID  0.0 - 2.5    LIPID PANEL     Status: Abnormal   Collection Time   10/15/11  5:06 AM      Component Value Range Comment   Cholesterol 214 (*) 0 - 200 (mg/dL)    Triglycerides 540 (*) <150 (mg/dL)    HDL 45  >98 (  mg/dL)    Total CHOL/HDL Ratio 4.8      VLDL 45 (*) 0 - 40 (mg/dL)    LDL Cholesterol 782 (*) 0 - 99 (mg/dL)   BASIC METABOLIC PANEL     Status: Abnormal   Collection Time   10/15/11  5:11 AM      Component Value Range Comment   Sodium 138  135 - 145 (mEq/L)    Potassium 3.9  3.5 - 5.1 (mEq/L)    Chloride 105  96 - 112 (mEq/L)    CO2 26  19 - 32 (mEq/L)    Glucose, Bld 77  70 - 99 (mg/dL)    BUN 10  6 - 23 (mg/dL)    Creatinine, Ser 9.56  0.50 - 1.10 (mg/dL)    Calcium 9.7  8.4 - 10.5 (mg/dL)    GFR calc non Af Amer 81 (*) >90 (mL/min)     GFR calc Af Amer >90  >90 (mL/min)   CBC     Status: Normal   Collection Time   10/15/11  5:11 AM      Component Value Range Comment   WBC 9.2  4.0 - 10.5 (K/uL)    RBC 3.96  3.87 - 5.11 (MIL/uL)    Hemoglobin 12.3  12.0 - 15.0 (g/dL)    HCT 21.3  08.6 - 57.8 (%)    MCV 91.4  78.0 - 100.0 (fL)    MCH 31.1  26.0 - 34.0 (pg)    MCHC 34.0  30.0 - 36.0 (g/dL)    RDW 46.9  62.9 - 52.8 (%)    Platelets 240  150 - 400 (K/uL)   URINALYSIS, ROUTINE W REFLEX MICROSCOPIC     Status: Abnormal   Collection Time   10/15/11  8:40 AM      Component Value Range Comment   Color, Urine YELLOW  YELLOW     Appearance CLEAR  CLEAR     Specific Gravity, Urine 1.010  1.005 - 1.030     pH 6.0  5.0 - 8.0     Glucose, UA NEGATIVE  NEGATIVE (mg/dL)    Hgb urine dipstick NEGATIVE  NEGATIVE     Bilirubin Urine NEGATIVE  NEGATIVE     Ketones, ur NEGATIVE  NEGATIVE (mg/dL)    Protein, ur NEGATIVE  NEGATIVE (mg/dL)    Urobilinogen, UA 0.2  0.0 - 1.0 (mg/dL)    Nitrite NEGATIVE  NEGATIVE     Leukocytes, UA TRACE (*) NEGATIVE    URINE MICROSCOPIC-ADD ON     Status: Abnormal   Collection Time   10/15/11  8:40 AM      Component Value Range Comment   Squamous Epithelial / LPF MANY (*) RARE     WBC, UA 0-2  <3 (WBC/hpf)    Bacteria, UA MANY (*) RARE      Dg Chest 2 View  10/14/2011  *RADIOLOGY REPORT*  Clinical Data: Left arm pain  CHEST - 2 VIEW  Comparison: None.  Findings: Normal mediastinum and cardiac silhouette.  Normal pulmonary  vasculature.  No evidence of effusion, infiltrate, or pneumothorax.  No acute bony abnormality.  IMPRESSION: No acute cardiopulmonary process.  Original Report Authenticated By: Genevive Bi, M.D.    ROS See HPI Eyes: Negative Ears:Negative for hearing loss, tinnitus Cardiovascular: Negative for chest pain, palpitations,irregular heartbeat, dyspnea, dyspnea on exertion, near-syncope, orthopnea, paroxysmal nocturnal dyspnia and syncope,edema, claudication, cyanosis,.    Respiratory:   Negative for cough, hemoptysis, shortness of breath, sleep disturbances due  to breathing, sputum production and wheezing.   Endocrine: Negative for cold intolerance and heat intolerance.  Hematologic/Lymphatic: Negative for adenopathy and bleeding problem. Does not bruise/bleed easily.  Musculoskeletal: claudication in her legs when she walks   Gastrointestinal: Negative for nausea, vomiting, reflux, abdominal pain, diarrhea, constipation.   Genitourinary: Negative for bladder incontinence, dysuria, flank pain, frequency, hematuria, hesitancy, nocturia and urgency.  Neurological: Negative.  Allergic/Immunologic: Negative for environmental allergies.  Blood pressure 136/82, pulse 59, temperature 97.8 F (36.6 C), temperature source Oral, resp. rate 18, height 5\' 7"  (1.702 m), weight 129 lb 10.1 oz (58.8 kg), SpO2 97.00%. Physical Exam PHYSICAL EXAM: Well-nournished, in no acute distress. Neck: bilateral carotid bruits, right greater than left, no JVD, HJR, , or thyroid enlargement Lungs: No tachypnea, clear without wheezing, rales, or rhonchi Cardiovascular: RRR, PMI not displaced,positive S4 and 1-2/6 systolic murmur at the left sternal border and apex, no bruit, thrill, or heave. Abdomen: BS normal. Soft without organomegaly, masses, lesions or tenderness. Extremities: decreased distal pulses bilaterally in the lower extremities,without cyanosis, clubbing or edema. SKin: Warm, no lesions or rashes  Musculoskeletal: No deformities Neuro: no focal signs  EKG: normal sinus rhythm, normal EKG  Assessment/Plan: 1. Chest pain, cardiac enzymes negative and EKG's normal. Patient's symptoms are somewhat atypical but similar to when she was seen at [facility] 2 years ago. She prefers any follow-up and subsequent treatment to be done at [facility]. Her cardiac enzymes are negative so she can probably be discharged today if she has an appointment to see Dr. Allena Katz. 2. CAD: cath at [facility] 2 yrs ago  with unsuccessful stent placement & treated medically 3. COPD: continues to smoke.Discussed smoking cessation. 4. Hyperlipidemia on Zocor 5. ASPVD: has f/u with Dr. Livia Snellen at [facility] in 2 weeks. Will have carotid Dopplers done at that time.  Patient has decreased distal pulses and a history of claudication. 6. +FH of coronary artery disease  Jacolyn Reedy 10/15/2011, 9:52 AM    Cardiology Attending Patient interviewed and examined. Discussed with Joni Reining, NP.  Above note annotated and modified based upon my findings.  Chest discomfort is atypical, and I doubt her symptoms reflect myocardial ischemia; however, with a history of coronary disease, we will plan to proceed with a stress nuclear study in the morning. Although she experiences claudication, she was able to complete 5 minutes on a peripheral vascular stress test recently and would like to study to be performed with exercise rather than pharmacologic stress.  Normal cardiac markers, a benign physical examination and a normal EKG are reassuring. Patient can be discharged to return for stress testing in the morning. If that study is negative, she can follow-up with her longtime cardiologist as planned.  Roseland Bing, MD

## 2011-10-15 NOTE — Progress Notes (Signed)
UR Chart Review Completed  

## 2011-10-15 NOTE — Progress Notes (Signed)
Reviewed discharge instructions with patient. Called [facility] to pick up patient at 1530. Patient discharged to home in stable condition.

## 2011-10-16 LAB — URINE CULTURE

## 2011-10-24 DIAGNOSIS — I701 Atherosclerosis of renal artery: Secondary | ICD-10-CM | POA: Insufficient documentation

## 2011-10-24 DIAGNOSIS — R002 Palpitations: Secondary | ICD-10-CM | POA: Insufficient documentation

## 2011-10-24 DIAGNOSIS — Z720 Tobacco use: Secondary | ICD-10-CM | POA: Insufficient documentation

## 2011-10-24 DIAGNOSIS — I679 Cerebrovascular disease, unspecified: Secondary | ICD-10-CM | POA: Insufficient documentation

## 2011-10-24 DIAGNOSIS — E785 Hyperlipidemia, unspecified: Secondary | ICD-10-CM | POA: Insufficient documentation

## 2011-10-24 DIAGNOSIS — I10 Essential (primary) hypertension: Secondary | ICD-10-CM | POA: Insufficient documentation

## 2011-10-24 DIAGNOSIS — F419 Anxiety disorder, unspecified: Secondary | ICD-10-CM | POA: Insufficient documentation

## 2011-11-08 DIAGNOSIS — I770 Arteriovenous fistula, acquired: Secondary | ICD-10-CM | POA: Insufficient documentation

## 2012-09-06 ENCOUNTER — Emergency Department (HOSPITAL_COMMUNITY)
Admission: EM | Admit: 2012-09-06 | Discharge: 2012-09-06 | DRG: 203 | Disposition: A | Discharge status: Home / Self Care | Payer: Medicaid Other | Attending: Emergency Medicine | Admitting: Emergency Medicine

## 2012-09-06 ENCOUNTER — Encounter (HOSPITAL_COMMUNITY): Payer: Self-pay | Admitting: *Deleted

## 2012-09-06 ENCOUNTER — Emergency Department (HOSPITAL_COMMUNITY): Payer: Medicaid Other

## 2012-09-06 DIAGNOSIS — I10 Essential (primary) hypertension: Secondary | ICD-10-CM | POA: Diagnosis present

## 2012-09-06 DIAGNOSIS — Z8249 Family history of ischemic heart disease and other diseases of the circulatory system: Secondary | ICD-10-CM

## 2012-09-06 DIAGNOSIS — F3289 Other specified depressive episodes: Secondary | ICD-10-CM | POA: Diagnosis present

## 2012-09-06 DIAGNOSIS — E785 Hyperlipidemia, unspecified: Secondary | ICD-10-CM | POA: Diagnosis present

## 2012-09-06 DIAGNOSIS — J40 Bronchitis, not specified as acute or chronic: Secondary | ICD-10-CM | POA: Diagnosis present

## 2012-09-06 DIAGNOSIS — F329 Major depressive disorder, single episode, unspecified: Secondary | ICD-10-CM | POA: Diagnosis present

## 2012-09-06 DIAGNOSIS — F17200 Nicotine dependence, unspecified, uncomplicated: Secondary | ICD-10-CM | POA: Diagnosis present

## 2012-09-06 DIAGNOSIS — I2510 Atherosclerotic heart disease of native coronary artery without angina pectoris: Secondary | ICD-10-CM | POA: Diagnosis present

## 2012-09-06 DIAGNOSIS — I739 Peripheral vascular disease, unspecified: Secondary | ICD-10-CM | POA: Diagnosis present

## 2012-09-06 DIAGNOSIS — Z7982 Long term (current) use of aspirin: Secondary | ICD-10-CM

## 2012-09-06 DIAGNOSIS — J4489 Other specified chronic obstructive pulmonary disease: Secondary | ICD-10-CM | POA: Diagnosis present

## 2012-09-06 DIAGNOSIS — J449 Chronic obstructive pulmonary disease, unspecified: Secondary | ICD-10-CM | POA: Diagnosis present

## 2012-09-06 DIAGNOSIS — Z79899 Other long term (current) drug therapy: Secondary | ICD-10-CM

## 2012-09-06 HISTORY — DX: Essential (primary) hypertension: I10

## 2012-09-06 MED ORDER — ALBUTEROL SULFATE (5 MG/ML) 0.5% IN NEBU
5.0000 mg | INHALATION_SOLUTION | Freq: Once | RESPIRATORY_TRACT | Status: AC
  Administered 2012-09-06: 5 mg via RESPIRATORY_TRACT
  Filled 2012-09-06: qty 1

## 2012-09-06 MED ORDER — ALPRAZOLAM 0.5 MG PO TABS: 0.5000 mg | ORAL_TABLET | Freq: Every evening | ORAL | Status: DC | PRN

## 2012-09-06 MED ORDER — IPRATROPIUM BROMIDE 0.02 % IN SOLN
0.5000 mg | Freq: Once | RESPIRATORY_TRACT | Status: AC
  Administered 2012-09-06: 0.5 mg via RESPIRATORY_TRACT
  Filled 2012-09-06: qty 2.5

## 2012-09-06 MED ORDER — LEVOFLOXACIN 500 MG PO TABS: 500.0000 mg | ORAL_TABLET | Freq: Every day | ORAL | Status: DC

## 2012-09-06 MED ORDER — ALBUTEROL SULFATE HFA 108 (90 BASE) MCG/ACT IN AERS: 2.0000 | INHALATION_SPRAY | RESPIRATORY_TRACT | Status: DC | PRN

## 2012-09-06 MED ORDER — ALPRAZOLAM 0.5 MG PO TABS
0.5000 mg | ORAL_TABLET | Freq: Once | ORAL | Status: AC
  Administered 2012-09-06: 0.5 mg via ORAL
  Filled 2012-09-06: qty 1

## 2012-09-06 NOTE — ED Notes (Signed)
Pt feeling "poorly" last two weeks, sob today and nauseated, denies emesis/diarrhea, EMS gave 4mg  zofran en route.

## 2012-09-06 NOTE — ED Notes (Signed)
Called to Pt's room , pt asking for ride home and something to eat. EDP aware.

## 2012-09-06 NOTE — ED Notes (Signed)
Pt given soda and crackers. PIV removed without difficulty. Pt states feels much better.

## 2012-09-06 NOTE — ED Provider Notes (Signed)
History     CSN: [id number]  Arrival date & time 09/06/12  1609   First MD Initiated Contact with Patient 09/06/12 1627      Chief Complaint  Patient presents with  . Shortness of Breath  . Nausea    (Consider location/radiation/quality/duration/timing/severity/associated sxs/prior treatment) HPI....cough for 2 weeks. Seen in [place] for same and prescribed Zithromax for bronchitis and COPD. Negative chest x-ray at that time. Patient continues to cough and be slightly short of breath. Nothing makes symptoms better or worse. Severity is mild. Her brother died 2 weeks ago which has bothered her greatly  Past Medical History  Diagnosis Date  . Coronary artery disease   . PAD (peripheral artery disease)   . Depression   . COPD (chronic obstructive pulmonary disease)   . Hyperlipidemia   . Hypertension     Past Surgical History  Procedure Date  . Appendectomy     Family History  Problem Relation Age of Onset  . Heart failure Mother   . Heart attack Mother     mother died at 36 of an MI  . Heart attack Father     father died at 67 of an MI  . Heart attack Brother     Brother had an MI in his 22s    History  Substance Use Topics  . Smoking status: Current Every Day Smoker -- 0.5 packs/day for 20 years    Types: Cigarettes  . Smokeless tobacco: Never Used  . Alcohol Use: Yes     occasional    OB History    Grav Para Term Preterm Abortions TAB SAB Ect Mult Living   4 3 3  1  1   3       Review of Systems  All other systems reviewed and are negative.    Allergies  Benadryl and Chantix  Home Medications   Current Outpatient Rx  Name Route Sig Dispense Refill  . ALBUTEROL SULFATE HFA 108 (90 BASE) MCG/ACT IN AERS Inhalation Inhale 2 puffs into the lungs every 6 (six) hours as needed. For shortness of breath     . ALPRAZOLAM 1 MG PO TABS Oral Take 1 mg by mouth 4 (four) times daily as needed. For anxiety    . ASPIRIN EC 81 MG PO TBEC Oral Take 81 mg by  mouth daily.      Marland Kitchen CARVEDILOL 12.5 MG PO TABS Oral Take 12.5 mg by mouth 2 (two) times daily.      Marland Kitchen CITALOPRAM HYDROBROMIDE 10 MG PO TABS Oral Take 10 mg by mouth daily.    Marland Kitchen LISINOPRIL 20 MG PO TABS Oral Take 20 mg by mouth daily.      . OXYCODONE-ACETAMINOPHEN 5-325 MG PO TABS Oral Take 1 tablet by mouth every 6 (six) hours as needed. For pain     . PREGABALIN 300 MG PO CAPS Oral Take 300 mg by mouth 2 (two) times daily.    . TRAZODONE HCL 50 MG PO TABS Oral Take 50 mg by mouth at bedtime.      . ALBUTEROL SULFATE HFA 108 (90 BASE) MCG/ACT IN AERS Inhalation Inhale 2 puffs into the lungs every 2 (two) hours as needed for wheezing or shortness of breath (cough). 1 Inhaler 0  . ALPRAZOLAM 0.5 MG PO TABS Oral Take 1 tablet (0.5 mg total) by mouth at bedtime as needed for sleep. 20 tablet 0  . LEVOFLOXACIN 500 MG PO TABS Oral Take 1 tablet (500 mg total) by mouth  daily. 10 tablet 0  . SIMVASTATIN 20 MG PO TABS Oral Take 20 mg by mouth daily.        BP 138/78  Pulse 87  Temp 99 F (37.2 C) (Oral)  Resp 18  Ht 5\' 7"  (1.702 m)  Wt 130 lb (58.968 kg)  BMI 20.36 kg/m2  SpO2 99%  Physical Exam  Nursing note and vitals reviewed. Constitutional: She is oriented to person, place, and time. She appears well-developed and well-nourished.  HENT:  Head: Normocephalic and atraumatic.  Eyes: Conjunctivae normal and EOM are normal. Pupils are equal, round, and reactive to light.  Neck: Normal range of motion. Neck supple.  Cardiovascular: Normal rate, regular rhythm and normal heart sounds.   Pulmonary/Chest: Effort normal and breath sounds normal.  Abdominal: Soft. Bowel sounds are normal.  Musculoskeletal: Normal range of motion.  Neurological: She is alert and oriented to person, place, and time.  Skin: Skin is warm and dry.  Psychiatric: She has a normal mood and affect.    ED Course  Procedures (including critical care time)  Labs Reviewed - No data to display Dg Chest 2  View  09/06/2012  *RADIOLOGY REPORT*  Clinical Data: Cough.  CHEST - 2 VIEW  Comparison: 10/14/2011.  Findings: The heart remains normal in size.  The lungs remain clear and hyperexpanded with mild diffuse peribronchial thickening and accentuation of the interstitial markings.  Unremarkable bones.  IMPRESSION: Stable changes of COPD and chronic bronchitis.  No acute abnormality.   Original Report Authenticated By: Darrol Angel, M.D.      1. Bronchitis       MDM  Chest x-ray negative.  Vital signs are normal. Discharge home on Levaquin, albuterol inhaler, Xanax.        Donnetta Hutching, MD 09/06/12 [phone]

## 2013-08-10 ENCOUNTER — Emergency Department (HOSPITAL_COMMUNITY)
Admission: EM | Admit: 2013-08-10 | Discharge: 2013-08-10 | Disposition: A | Discharge status: Home / Self Care | Payer: Medicaid Other | Attending: Emergency Medicine | Admitting: Emergency Medicine

## 2013-08-10 ENCOUNTER — Emergency Department (HOSPITAL_COMMUNITY): Payer: Medicaid Other

## 2013-08-10 ENCOUNTER — Encounter (HOSPITAL_COMMUNITY): Payer: Self-pay

## 2013-08-10 DIAGNOSIS — S2239XA Fracture of one rib, unspecified side, initial encounter for closed fracture: Secondary | ICD-10-CM | POA: Insufficient documentation

## 2013-08-10 DIAGNOSIS — J449 Chronic obstructive pulmonary disease, unspecified: Secondary | ICD-10-CM | POA: Insufficient documentation

## 2013-08-10 DIAGNOSIS — W1809XA Striking against other object with subsequent fall, initial encounter: Secondary | ICD-10-CM | POA: Insufficient documentation

## 2013-08-10 DIAGNOSIS — J4489 Other specified chronic obstructive pulmonary disease: Secondary | ICD-10-CM | POA: Insufficient documentation

## 2013-08-10 DIAGNOSIS — F329 Major depressive disorder, single episode, unspecified: Secondary | ICD-10-CM | POA: Insufficient documentation

## 2013-08-10 DIAGNOSIS — S62109A Fracture of unspecified carpal bone, unspecified wrist, initial encounter for closed fracture: Secondary | ICD-10-CM | POA: Insufficient documentation

## 2013-08-10 DIAGNOSIS — F3289 Other specified depressive episodes: Secondary | ICD-10-CM | POA: Insufficient documentation

## 2013-08-10 DIAGNOSIS — I2510 Atherosclerotic heart disease of native coronary artery without angina pectoris: Secondary | ICD-10-CM | POA: Insufficient documentation

## 2013-08-10 DIAGNOSIS — Y939 Activity, unspecified: Secondary | ICD-10-CM | POA: Insufficient documentation

## 2013-08-10 DIAGNOSIS — Z79899 Other long term (current) drug therapy: Secondary | ICD-10-CM | POA: Insufficient documentation

## 2013-08-10 DIAGNOSIS — E785 Hyperlipidemia, unspecified: Secondary | ICD-10-CM | POA: Insufficient documentation

## 2013-08-10 DIAGNOSIS — I10 Essential (primary) hypertension: Secondary | ICD-10-CM | POA: Insufficient documentation

## 2013-08-10 DIAGNOSIS — S2232XA Fracture of one rib, left side, initial encounter for closed fracture: Secondary | ICD-10-CM

## 2013-08-10 DIAGNOSIS — Y9289 Other specified places as the place of occurrence of the external cause: Secondary | ICD-10-CM | POA: Insufficient documentation

## 2013-08-10 DIAGNOSIS — F17200 Nicotine dependence, unspecified, uncomplicated: Secondary | ICD-10-CM | POA: Insufficient documentation

## 2013-08-10 MED ORDER — HYDROCODONE-ACETAMINOPHEN 5-325 MG PO TABS
1.0000 | ORAL_TABLET | Freq: Once | ORAL | Status: AC
  Administered 2013-08-10: 1 via ORAL
  Filled 2013-08-10: qty 1

## 2013-08-10 MED ORDER — OXYCODONE-ACETAMINOPHEN 5-325 MG PO TABS: 2.0000 | ORAL_TABLET | ORAL | Status: DC | PRN

## 2013-08-10 NOTE — ED Provider Notes (Signed)
CSN: [id number]     Arrival date & time 08/10/13  1750 History   First MD Initiated Contact with Patient 08/10/13 1850     Chief Complaint  Patient presents with  . Fall   (Consider location/radiation/quality/duration/timing/severity/associated sxs/prior Treatment) HPI Comments: Patient presents to ER with 2 separate complaints. Patient reports he fell a week ago in the bathtub, hitting her left ribs on the edge of the tub. There was no head injury. No loss of consciousness. Patient reports that she has been having moderate to severe pain in left ribs ever since. She has been taking Tylenol without much improvement. She reports that she thought the pain would get better but it has not.  She also complains of left wrist pain. Patient reports that she injured about 2 weeks ago. Pain is moderate worse with movement.  Patient is a 57 y.o. female presenting with fall.  Fall Pertinent negatives include no shortness of breath.    Past Medical History  Diagnosis Date  . Coronary artery disease   . PAD (peripheral artery disease)   . Depression   . COPD (chronic obstructive pulmonary disease)   . Hyperlipidemia   . Hypertension    Past Surgical History  Procedure Laterality Date  . Appendectomy     Family History  Problem Relation Age of Onset  . Heart failure Mother   . Heart attack Mother     mother died at 78 of an MI  . Heart attack Father     father died at 44 of an MI  . Heart attack Brother     Brother had an MI in his 46s   History  Substance Use Topics  . Smoking status: Current Every Day Smoker -- 0.50 packs/day for 20 years    Types: Cigarettes  . Smokeless tobacco: Never Used  . Alcohol Use: Yes     Comment: occasional   OB History   Grav Para Term Preterm Abortions TAB SAB Ect Mult Living   4 3 3  1  1   3      Review of Systems  Respiratory: Negative for shortness of breath.   Musculoskeletal: Positive for arthralgias.       Rib pain   All other systems  reviewed and are negative.    Allergies  Benadryl and Chantix  Home Medications   Current Outpatient Rx  Name  Route  Sig  Dispense  Refill  . albuterol (PROVENTIL HFA;VENTOLIN HFA) 108 (90 BASE) MCG/ACT inhaler   Inhalation   Inhale 2 puffs into the lungs every 2 (two) hours as needed for wheezing or shortness of breath (cough).   1 Inhaler   0   . albuterol (VENTOLIN HFA) 108 (90 BASE) MCG/ACT inhaler   Inhalation   Inhale 2 puffs into the lungs every 6 (six) hours as needed. For shortness of breath          . ALPRAZolam (XANAX) 0.5 MG tablet   Oral   Take 1 tablet (0.5 mg total) by mouth at bedtime as needed for sleep.   20 tablet   0   . ALPRAZolam (XANAX) 1 MG tablet   Oral   Take 1 mg by mouth 4 (four) times daily as needed. For anxiety         . aspirin EC 81 MG tablet   Oral   Take 81 mg by mouth daily.           . carvedilol (COREG) 12.5 MG tablet  Oral   Take 12.5 mg by mouth 2 (two) times daily.           . citalopram (CELEXA) 10 MG tablet   Oral   Take 10 mg by mouth daily.         Marland Kitchen levofloxacin (LEVAQUIN) 500 MG tablet   Oral   Take 1 tablet (500 mg total) by mouth daily.   10 tablet   0   . lisinopril (PRINIVIL,ZESTRIL) 20 MG tablet   Oral   Take 20 mg by mouth daily.           Marland Kitchen oxyCODONE-acetaminophen (PERCOCET) 5-325 MG per tablet   Oral   Take 1 tablet by mouth every 6 (six) hours as needed. For pain          . pregabalin (LYRICA) 300 MG capsule   Oral   Take 300 mg by mouth 2 (two) times daily.         . simvastatin (ZOCOR) 20 MG tablet   Oral   Take 20 mg by mouth daily.           . traZODone (DESYREL) 50 MG tablet   Oral   Take 50 mg by mouth at bedtime.            BP 107/72  Pulse 77  Temp(Src) 98.2 F (36.8 C) (Oral)  Resp 20  Ht 5\' 7"  (1.702 m)  Wt 130 lb (58.968 kg)  BMI 20.36 kg/m2  SpO2 99% Physical Exam  Constitutional: She is oriented to person, place, and time. She appears  well-developed and well-nourished. No distress.  HENT:  Head: Normocephalic and atraumatic.  Right Ear: Hearing normal.  Left Ear: Hearing normal.  Nose: Nose normal.  Mouth/Throat: Oropharynx is clear and moist and mucous membranes are normal.  Eyes: Conjunctivae and EOM are normal. Pupils are equal, round, and reactive to light.  Neck: Normal range of motion. Neck supple.  Cardiovascular: Regular rhythm, S1 normal and S2 normal.  Exam reveals no gallop and no friction rub.   No murmur heard. Pulmonary/Chest: Effort normal and breath sounds normal. No respiratory distress. She exhibits tenderness.    Abdominal: Soft. Normal appearance and bowel sounds are normal. There is no hepatosplenomegaly. There is no tenderness. There is no rebound, no guarding, no tenderness at McBurney's point and negative Murphy's sign. No hernia.  Musculoskeletal: Normal range of motion.       Left wrist: She exhibits tenderness. She exhibits no deformity.  Neurological: She is alert and oriented to person, place, and time. She has normal strength. No cranial nerve deficit or sensory deficit. Coordination normal. GCS eye subscore is 4. GCS verbal subscore is 5. GCS motor subscore is 6.  Skin: Skin is warm, dry and intact. No rash noted. No cyanosis.  Psychiatric: She has a normal mood and affect. Her speech is normal and behavior is normal. Thought content normal.    ED Course  Procedures (including critical care time) Labs Review Labs Reviewed - No data to display Imaging Review Dg Ribs Unilateral W/chest Left  08/10/2013   *RADIOLOGY REPORT*  Clinical Data: Fall with left rib pain  LEFT RIBS AND CHEST - 3+ VIEW  Comparison: 09/06/2012  Findings: The lungs are clear without focal infiltrate, edema, pneumothorax or pleural effusion. Interstitial markings are diffusely coarsened with chronic features. Cardiopericardial silhouette is at upper limits of normal for size.  Oblique views of the left ribs show no acute  fracture of the left anterolateral seventh rib.  IMPRESSION: Left anterior seventh rib fracture.   Original Report Authenticated By: Kennith Center, M.D.   Dg Wrist Complete Left  08/10/2013   *RADIOLOGY REPORT*  Clinical Data: Fall with left wrist pain.  LEFT WRIST - COMPLETE 3+ VIEW  Comparison: None.  Findings: Distal radius fracture identified, involving the metaphysis.  There is apex anterior angulation at the fracture site.  Sclerosis at the fracture site suggests that this may be nonacute injury although an element of impaction likely contributes to the appearance.  Associated ulnar styloid fracture is evident. The bones are diffusely demineralized.  IMPRESSION: Distal radius fracture with associated fracture of the ulnar styloid.   Original Report Authenticated By: Kennith Center, M.D.    MDM  Diagnosis: 1. Left seventh rib fracture 2. Left wrist fracture  As presents to the ER with complaints of pain in the left ribs after a fall which occurred one week ago. She is breathing currently, lungs are clear. X-ray reveals left seventh rib fracture. No underlying lung pathology.  Patient complains of left wrist injury from 2 weeks ago. X-ray confirms fracture. Patient placed in splint, followup with orthopedics.    Gilda Crease, MD 08/10/13 [phone]

## 2013-08-10 NOTE — ED Notes (Signed)
1. Pt fell 1 week ago on bathtub and injured her left rib cage. Denies any other injury 2. Injured left wrist 2 weeks ago "learning a martial move" swelling noted to ext.

## 2013-08-10 NOTE — ED Notes (Signed)
Larey Seat a week ago pain to the left ribs, pt also complaining of pain to the left wrist from a fall also.

## 2013-08-10 NOTE — ED Notes (Signed)
Pt alert & oriented x4, stable gait. Patient given discharge instructions, paperwork & prescription(s). Patient  instructed to stop at the registration desk to finish any additional paperwork. Patient verbalized understanding. Pt left department w/ no further questions. 

## 2013-10-18 ENCOUNTER — Encounter (HOSPITAL_COMMUNITY): Payer: Self-pay | Admitting: Emergency Medicine

## 2013-10-18 ENCOUNTER — Emergency Department (HOSPITAL_COMMUNITY)
Admission: EM | Admit: 2013-10-18 | Discharge: 2013-10-18 | Disposition: A | Discharge status: Home / Self Care | Payer: Medicaid Other | Attending: Emergency Medicine | Admitting: Emergency Medicine

## 2013-10-18 ENCOUNTER — Emergency Department (HOSPITAL_COMMUNITY): Payer: Medicaid Other

## 2013-10-18 DIAGNOSIS — S42209A Unspecified fracture of upper end of unspecified humerus, initial encounter for closed fracture: Secondary | ICD-10-CM | POA: Insufficient documentation

## 2013-10-18 DIAGNOSIS — I739 Peripheral vascular disease, unspecified: Secondary | ICD-10-CM | POA: Insufficient documentation

## 2013-10-18 DIAGNOSIS — M79609 Pain in unspecified limb: Secondary | ICD-10-CM | POA: Insufficient documentation

## 2013-10-18 DIAGNOSIS — Z79899 Other long term (current) drug therapy: Secondary | ICD-10-CM | POA: Insufficient documentation

## 2013-10-18 DIAGNOSIS — W010XXA Fall on same level from slipping, tripping and stumbling without subsequent striking against object, initial encounter: Secondary | ICD-10-CM | POA: Insufficient documentation

## 2013-10-18 DIAGNOSIS — R5381 Other malaise: Secondary | ICD-10-CM | POA: Insufficient documentation

## 2013-10-18 DIAGNOSIS — G8929 Other chronic pain: Secondary | ICD-10-CM | POA: Insufficient documentation

## 2013-10-18 DIAGNOSIS — F3289 Other specified depressive episodes: Secondary | ICD-10-CM | POA: Insufficient documentation

## 2013-10-18 DIAGNOSIS — F17200 Nicotine dependence, unspecified, uncomplicated: Secondary | ICD-10-CM | POA: Insufficient documentation

## 2013-10-18 DIAGNOSIS — W1809XA Striking against other object with subsequent fall, initial encounter: Secondary | ICD-10-CM | POA: Insufficient documentation

## 2013-10-18 DIAGNOSIS — J4489 Other specified chronic obstructive pulmonary disease: Secondary | ICD-10-CM | POA: Insufficient documentation

## 2013-10-18 DIAGNOSIS — I2510 Atherosclerotic heart disease of native coronary artery without angina pectoris: Secondary | ICD-10-CM | POA: Insufficient documentation

## 2013-10-18 DIAGNOSIS — S4292XA Fracture of left shoulder girdle, part unspecified, initial encounter for closed fracture: Secondary | ICD-10-CM

## 2013-10-18 DIAGNOSIS — Z9181 History of falling: Secondary | ICD-10-CM | POA: Insufficient documentation

## 2013-10-18 DIAGNOSIS — Y9289 Other specified places as the place of occurrence of the external cause: Secondary | ICD-10-CM | POA: Insufficient documentation

## 2013-10-18 DIAGNOSIS — IMO0002 Reserved for concepts with insufficient information to code with codable children: Secondary | ICD-10-CM | POA: Insufficient documentation

## 2013-10-18 DIAGNOSIS — F329 Major depressive disorder, single episode, unspecified: Secondary | ICD-10-CM | POA: Insufficient documentation

## 2013-10-18 DIAGNOSIS — E785 Hyperlipidemia, unspecified: Secondary | ICD-10-CM | POA: Insufficient documentation

## 2013-10-18 DIAGNOSIS — J449 Chronic obstructive pulmonary disease, unspecified: Secondary | ICD-10-CM | POA: Insufficient documentation

## 2013-10-18 DIAGNOSIS — Y9301 Activity, walking, marching and hiking: Secondary | ICD-10-CM | POA: Insufficient documentation

## 2013-10-18 DIAGNOSIS — R21 Rash and other nonspecific skin eruption: Secondary | ICD-10-CM | POA: Insufficient documentation

## 2013-10-18 DIAGNOSIS — Z7982 Long term (current) use of aspirin: Secondary | ICD-10-CM | POA: Insufficient documentation

## 2013-10-18 DIAGNOSIS — I10 Essential (primary) hypertension: Secondary | ICD-10-CM | POA: Insufficient documentation

## 2013-10-18 MED ORDER — OXYCODONE-ACETAMINOPHEN 5-325 MG PO TABS: 1.0000 | ORAL_TABLET | ORAL | Status: DC | PRN

## 2013-10-18 MED ORDER — OXYCODONE-ACETAMINOPHEN 5-325 MG PO TABS
1.0000 | ORAL_TABLET | Freq: Once | ORAL | Status: AC
  Administered 2013-10-18: 1 via ORAL
  Filled 2013-10-18: qty 1

## 2013-10-18 NOTE — ED Notes (Signed)
Pt states that she has been having several "falls" lately due to her legs "giving away", fell 3 days ago hitting her left shoulder and side of the face on the pavement, denies any LOC, c/o pain to left shoulder and left arm. Cms intact distal.

## 2013-10-18 NOTE — ED Provider Notes (Signed)
CSN: [id number]     Arrival date & time 10/18/13  1603 History  This chart was scribed for Joya Gaskins, MD by Bennett Scrape, ED Scribe. This patient was seen in room APA05/[facility]05 and the patient's care was started at 4:34 PM.   Chief Complaint  Patient presents with  . Fall  . Shoulder Pain    Patient is a 57 y.o. female presenting with fall. The history is provided by the patient. No language interpreter was used.  Fall This is a recurrent (due to h/o PAD causing pain and weakness) problem. The current episode started more than 2 days ago. Episode frequency: once in the past few days. Pertinent negatives include no abdominal pain and no headaches. The symptoms are aggravated by walking. The symptoms are relieved by rest. She has tried nothing for the symptoms.    HPI Comments: Traci Duran is a 57 y.o. female who presents to the Emergency Department complaining of a fall 3 days ago. Pt states that she has a h/o PAD that causes chronic leg pain and weakness. She states that the weakness and pain have been triggers for falls in the past. She reports that she was walking 3 days ago when her legs "gave out" and she fell landing on the left shoulder. She denies any other injuries. She states that she scrapped her face on the pavement as she fell but denies any LOC. She reports a prior left forearm fx from a fall but denies any changes or similar symptoms. She states that she was told to f/u with an orthopedist but did not. She is here today due to concern over reduced ROM secondary to pain of the left shoulder.  he denies being on any pain medications at home stating that she only takes OTC medications. She denies any preceding lightheadedness or dizziness.  She denies HA, neck or back pain.She is currently on Plavix for her CAD.  She has a secondary complaint of a rash to her abdomen which has been present for the past 2 weeks. It is pruritic in nature and does not include her palms or soles  of her feet. She reports that she has a dog with a recent flea infestation and believes that it may be related to flea bites. She states that she has tried Benadryl and baby powder with mild improvement. She denies any sick contacts with similar symptoms.   Past Medical History  Diagnosis Date  . Coronary artery disease   . PAD (peripheral artery disease)   . Depression   . COPD (chronic obstructive pulmonary disease)   . Hyperlipidemia   . Hypertension    Past Surgical History  Procedure Laterality Date  . Appendectomy     Family History  Problem Relation Age of Onset  . Heart failure Mother   . Heart attack Mother     mother died at 26 of an MI  . Heart attack Father     father died at 1 of an MI  . Heart attack Brother     Brother had an MI in his 32s   History  Substance Use Topics  . Smoking status: Current Every Day Smoker -- 0.50 packs/day for 20 years    Types: Cigarettes  . Smokeless tobacco: Never Used  . Alcohol Use: Yes     Comment: occasional   OB History   Grav Para Term Preterm Abortions TAB SAB Ect Mult Living   4 3 3  1   1  3     Review of Systems  Gastrointestinal: Negative for nausea, vomiting and abdominal pain.  Musculoskeletal: Positive for arthralgias. Negative for back pain and neck pain.  Skin: Positive for rash.  Neurological: Positive for weakness (chronic, no new weakness). Negative for syncope, numbness and headaches.  All other systems reviewed and are negative.    Allergies  Benadryl and Chantix  Home Medications   Current Outpatient Rx  Name  Route  Sig  Dispense  Refill  . albuterol (VENTOLIN HFA) 108 (90 BASE) MCG/ACT inhaler   Inhalation   Inhale 2 puffs into the lungs every 6 (six) hours as needed. For shortness of breath          . ALPRAZolam (XANAX) 1 MG tablet   Oral   Take 1 mg by mouth 4 (four) times daily as needed. For anxiety         . aspirin EC 81 MG tablet   Oral   Take 81 mg by mouth daily.            . carvedilol (COREG) 12.5 MG tablet   Oral   Take 12.5 mg by mouth 2 (two) times daily.           . citalopram (CELEXA) 40 MG tablet   Oral   Take 40 mg by mouth daily.         Marland Kitchen lisinopril (PRINIVIL,ZESTRIL) 20 MG tablet   Oral   Take 20 mg by mouth daily.           Marland Kitchen oxyCODONE-acetaminophen (PERCOCET) 5-325 MG per tablet   Oral   Take 2 tablets by mouth every 4 (four) hours as needed for pain.   20 tablet   0   . pantoprazole (PROTONIX) 40 MG tablet   Oral   Take 40 mg by mouth 2 (two) times daily.         . pravastatin (PRAVACHOL) 40 MG tablet   Oral   Take 40 mg by mouth at bedtime.         . pregabalin (LYRICA) 300 MG capsule   Oral   Take 300 mg by mouth 2 (two) times daily.         . traZODone (DESYREL) 100 MG tablet   Oral   Take 100 mg by mouth at bedtime.          Triage Vitals: BP 122/106  Pulse 72  Temp(Src) 97.7 F (36.5 C) (Oral)  Ht 5\' 7"  (1.702 m)  Wt 135 lb (61.236 kg)  BMI 21.14 kg/m2  SpO2 100%  Physical Exam  Nursing note and vitals reviewed.  CONSTITUTIONAL: Well developed/well nourished HEAD: Normocephalic/atraumatic EYES: EOMI/PERRL ENMT: Mucous membranes moist NECK: supple no meningeal signs SPINE:entire spine nontender CV: S1/S2 noted, no murmurs/rubs/gallops noted LUNGS: Lungs are clear to auscultation bilaterally, no apparent distress ABDOMEN: soft, nontender, no rebound or guarding NEURO: Pt is awake/alert, moves all extremitiesx4 EXTREMITIES: pulses normal, full ROM, tenderness to palpation of left shoulder, no deformity, no bruising noted, full ROM noted, All other extremities/joints palpated/ranged and nontender SKIN: warm, color normal, scattered papules to abdomen, no other rash noted PSYCH: no abnormalities of mood noted  ED Course  Procedures (including critical care time)  DIAGNOSTIC STUDIES: Oxygen Saturation is 100% on room air, normal by my interpretation.    COORDINATION OF CARE: 4:38  PM-Discussed treatment plan which includes sling and pain medications with pt at bedside and pt agreed to plan. Advised pt that rash can be treated  with scabies cream and pt declined. Informed pt that she will have to f/u with an orthopedist. Pt has a ride home.  Labs Review Labs Reviewed - No data to display Imaging Review Dg Shoulder Left  10/18/2013   CLINICAL DATA:  Fall. Left shoulder pain.  EXAM: LEFT SHOULDER - 2+ VIEW  COMPARISON:  None.  FINDINGS: Cortical lucency noted at the base of the greater tuberosity. Subtle linear lucency appears to extend across the base of the greater tuberosity. Cannot exclude subtle fracture. Consider further evaluation with CT. No subluxation or dislocation. Early degenerative changes in the left [facility] joint.  IMPRESSION: Lucency noted at the base of the greater tuberosity. Cannot exclude subtle fracture. Consider CT for further evaluation if there is high clinical suspicion.   Electronically Signed   By: Charlett Nose M.D.   On: 10/18/2013 16:37    EKG Interpretation   None       MDM  No diagnosis found. Nursing notes including past medical history and social history reviewed and considered in documentation xrays reviewed and considered   I personally performed the services described in this documentation, which was scribed in my presence. The recorded information has been reviewed and is accurate.      Joya Gaskins, MD 10/18/13 [phone]

## 2013-10-24 ENCOUNTER — Ambulatory Visit (INDEPENDENT_AMBULATORY_CARE_PROVIDER_SITE_OTHER): Payer: Medicaid Other | Admitting: Orthopedic Surgery

## 2013-10-24 ENCOUNTER — Encounter: Payer: Self-pay | Admitting: Orthopedic Surgery

## 2013-10-24 VITALS — BP 111/62 | Ht 67.0 in | Wt 136.0 lb

## 2013-10-24 DIAGNOSIS — S42253A Displaced fracture of greater tuberosity of unspecified humerus, initial encounter for closed fracture: Secondary | ICD-10-CM

## 2013-10-24 DIAGNOSIS — S42252A Displaced fracture of greater tuberosity of left humerus, initial encounter for closed fracture: Secondary | ICD-10-CM

## 2013-10-24 MED ORDER — OXYCODONE-ACETAMINOPHEN 5-325 MG PO TABS: 1.0000 | ORAL_TABLET | ORAL | Status: DC | PRN

## 2013-10-24 NOTE — Progress Notes (Signed)
Patient ID: Traci Duran, female   DOB: 1956-10-16, 57 y.o.   MRN: [id number]  Chief Complaint  Patient presents with  . Shoulder Pain    Left shoulder fracture d/t injury 09/20/13    HISTORY: This patient fell November 14 injury to the left shoulder she went to the ER on November 16. It is sharp dull throbbing pain over the left greater tuberosity. Her pain is 9/10 unrelieved by Percocet 5 mg. The x-ray shows a hairline fracture in the left shoulder. She asked for a stronger pain medication in terms of milligrams. She complains of locking and catching swelling. Review of systems  Chest pain palpitations murmurs cough COPD snoring blood in the stool rash itching dizziness nervousness anxiety depression bleeds easily bruises easily excessive thirst temperature intolerance allergic can't take some Benadryl  History of peripheral artery disease pulmonary artery disease hypertension anxiety depression  She had stents placed in her arteries. Her medications are listed she was in her family history and social history is normal  She also reports an injury to her left wrist with a distal radius fracture which she did not seek followup and has deformity of the wrist now and pain with cold weather  Physical Exam(12)  Vital signs:   1.GENERAL: normal development   2. CDV: pulses are normal   3. Skin: normal  4. Lymph: nodes were not palpable/normal  5/6. Psychiatric: awake, alert and oriented, mood and affect normal   7. Neuro: normal sensation  8.   MSK  Gait: Ambulation is not affected and noncontributory 9.   Inspection left wrist is deformed. Left shoulder is normal except for swelling 10. Range of Motion normal range of motion left wrist decreased range of motion left shoulder with tenderness over the greater tuberosity 11. Motor muscle tone is normal throughout the left upper extremity 12. Stability the shoulder elbow wrist and hand are stable   Imaging nondisplaced hairline  fracture greater tuberosity  Assessment:  Encounter Diagnosis  Name Primary?  . Greater tuberosity of humerus fracture, left, closed, initial encounter Yes       Plan: Recommend sling for 2 weeks and then start physical therapy for 6 weeks  Meds ordered this encounter  Medications  . oxyCODONE-acetaminophen (PERCOCET/ROXICET) 5-325 MG per tablet    Sig: Take 1 tablet by mouth every 4 (four) hours as needed for severe pain.    Dispense:  90 tablet    Refill:  0

## 2013-10-24 NOTE — Patient Instructions (Signed)
Sling x 2 weeks   

## 2013-11-15 ENCOUNTER — Other Ambulatory Visit: Payer: Self-pay | Admitting: Orthopedic Surgery

## 2013-11-15 ENCOUNTER — Telehealth: Payer: Self-pay | Admitting: Orthopedic Surgery

## 2013-11-15 DIAGNOSIS — S42252A Displaced fracture of greater tuberosity of left humerus, initial encounter for closed fracture: Secondary | ICD-10-CM

## 2013-11-15 MED ORDER — OXYCODONE-ACETAMINOPHEN 5-325 MG PO TABS: 1.0000 | ORAL_TABLET | ORAL | Status: DC | PRN

## 2013-11-15 NOTE — Telephone Encounter (Signed)
Traci Duran wants a prescription for Oxycodone.  Has an appointment 11/21/13  [phone]

## 2013-11-21 ENCOUNTER — Ambulatory Visit (INDEPENDENT_AMBULATORY_CARE_PROVIDER_SITE_OTHER): Payer: Medicaid Other | Admitting: Orthopedic Surgery

## 2013-11-21 VITALS — Ht 67.0 in | Wt 136.0 lb

## 2013-11-21 DIAGNOSIS — S42309D Unspecified fracture of shaft of humerus, unspecified arm, subsequent encounter for fracture with routine healing: Secondary | ICD-10-CM

## 2013-11-21 DIAGNOSIS — S42252D Displaced fracture of greater tuberosity of left humerus, subsequent encounter for fracture with routine healing: Secondary | ICD-10-CM

## 2013-11-21 MED ORDER — HYDROCODONE-ACETAMINOPHEN 5-325 MG PO TABS: 1.0000 | ORAL_TABLET | Freq: Four times a day (QID) | ORAL | Status: DC | PRN

## 2013-11-21 NOTE — Patient Instructions (Signed)
Please f/u at ER for head injury   Start therapy in [place]  No follow up appointments needed   Take the prescribed norco for pain

## 2013-11-21 NOTE — Progress Notes (Signed)
Patient ID: Traci Duran, female   DOB: 09/23/1956, 57 y.o.   MRN: [id number]  Chief Complaint  Patient presents with  . Follow-up    2 week follow up on left shoulder fracture. DOI 09-20-13.    Followup status post nondisplaced greater tuberosity fracture left shoulder. Patient complains of left deltoid and left proximal shoulder pain.  She has Medicaid and will not be able to undergo an appropriate physical therapy so 1 giving her a home exercise program in order to get her allowed 3 visits and [place] film. She will not need followup. She needs to regain her range of motion and then she should have a normal functioning shoulder.  She is discharged with Norco for pain and physical therapy orders.

## 2013-12-07 NOTE — Telephone Encounter (Signed)
The prescription is ready to pickup, have tried several calls (left message) have not heard from her

## 2014-09-26 DIAGNOSIS — I771 Stricture of artery: Secondary | ICD-10-CM | POA: Insufficient documentation

## 2014-10-09 ENCOUNTER — Encounter: Payer: Self-pay | Admitting: Orthopedic Surgery

## 2014-11-28 ENCOUNTER — Emergency Department (HOSPITAL_COMMUNITY): Payer: Medicaid Other

## 2014-11-28 ENCOUNTER — Encounter (HOSPITAL_COMMUNITY): Payer: Self-pay | Admitting: *Deleted

## 2014-11-28 ENCOUNTER — Emergency Department (HOSPITAL_COMMUNITY)
Admission: EM | Admit: 2014-11-28 | Discharge: 2014-11-28 | Disposition: A | Discharge status: Home / Self Care | Payer: Medicaid Other | Attending: Emergency Medicine | Admitting: Emergency Medicine

## 2014-11-28 DIAGNOSIS — Y998 Other external cause status: Secondary | ICD-10-CM | POA: Insufficient documentation

## 2014-11-28 DIAGNOSIS — Y9389 Activity, other specified: Secondary | ICD-10-CM | POA: Insufficient documentation

## 2014-11-28 DIAGNOSIS — Z23 Encounter for immunization: Secondary | ICD-10-CM

## 2014-11-28 DIAGNOSIS — T148XXA Other injury of unspecified body region, initial encounter: Secondary | ICD-10-CM

## 2014-11-28 DIAGNOSIS — Z79899 Other long term (current) drug therapy: Secondary | ICD-10-CM | POA: Insufficient documentation

## 2014-11-28 DIAGNOSIS — I2510 Atherosclerotic heart disease of native coronary artery without angina pectoris: Secondary | ICD-10-CM | POA: Diagnosis not present

## 2014-11-28 DIAGNOSIS — F329 Major depressive disorder, single episode, unspecified: Secondary | ICD-10-CM | POA: Insufficient documentation

## 2014-11-28 DIAGNOSIS — Z720 Tobacco use: Secondary | ICD-10-CM | POA: Insufficient documentation

## 2014-11-28 DIAGNOSIS — S20212A Contusion of left front wall of thorax, initial encounter: Secondary | ICD-10-CM | POA: Diagnosis not present

## 2014-11-28 DIAGNOSIS — J449 Chronic obstructive pulmonary disease, unspecified: Secondary | ICD-10-CM | POA: Diagnosis not present

## 2014-11-28 DIAGNOSIS — R05 Cough: Secondary | ICD-10-CM | POA: Insufficient documentation

## 2014-11-28 DIAGNOSIS — E785 Hyperlipidemia, unspecified: Secondary | ICD-10-CM | POA: Insufficient documentation

## 2014-11-28 DIAGNOSIS — S61412A Laceration without foreign body of left hand, initial encounter: Secondary | ICD-10-CM | POA: Diagnosis not present

## 2014-11-28 DIAGNOSIS — W01198A Fall on same level from slipping, tripping and stumbling with subsequent striking against other object, initial encounter: Secondary | ICD-10-CM | POA: Diagnosis not present

## 2014-11-28 DIAGNOSIS — I10 Essential (primary) hypertension: Secondary | ICD-10-CM | POA: Insufficient documentation

## 2014-11-28 DIAGNOSIS — Y9289 Other specified places as the place of occurrence of the external cause: Secondary | ICD-10-CM | POA: Diagnosis not present

## 2014-11-28 DIAGNOSIS — W19XXXA Unspecified fall, initial encounter: Secondary | ICD-10-CM

## 2014-11-28 DIAGNOSIS — Z7982 Long term (current) use of aspirin: Secondary | ICD-10-CM | POA: Diagnosis not present

## 2014-11-28 DIAGNOSIS — S299XXA Unspecified injury of thorax, initial encounter: Secondary | ICD-10-CM | POA: Diagnosis present

## 2014-11-28 MED ORDER — TETANUS-DIPHTH-ACELL PERTUSSIS 5-2.5-18.5 LF-MCG/0.5 IM SUSP
0.5000 mL | Freq: Once | INTRAMUSCULAR | Status: AC
  Administered 2014-11-28: 0.5 mL via INTRAMUSCULAR
  Filled 2014-11-28: qty 0.5

## 2014-11-28 MED ORDER — TRAMADOL HCL 50 MG PO TABS: 50.0000 mg | ORAL_TABLET | Freq: Once | ORAL | Status: DC

## 2014-11-28 MED ORDER — TRAMADOL HCL 50 MG PO TABS
50.0000 mg | ORAL_TABLET | Freq: Once | ORAL | Status: AC
  Administered 2014-11-28: 50 mg via ORAL
  Filled 2014-11-28: qty 1

## 2014-11-28 NOTE — ED Notes (Addendum)
Pt complains of pain in her left rib cage. Pt states she fell on her left side on Sunday. Pt has been taking Aleve. Pt also stated she was concerned about blood in her stool 3 weeks ago, which has since stopped. Pt has skin tear on left hand from fall.

## 2014-11-28 NOTE — ED Provider Notes (Signed)
CSN: [id number]     Arrival date & time 11/28/14  1943 History  This chart was scribed for a non-physician practitioner, Garald Balding, NP working with Tanna Furry, MD by Martinique Peace, ED Scribe. The patient was seen in [facility]. The patient's care was started at 8:20 PM.     Chief Complaint  Patient presents with  . Rib Injury      The history is provided by the patient. No language interpreter was used.    HPI Comments: Traci Duran is a 58 y.o. female who presents to the Emergency Department complaining of injury to left rib cage onset 2 days ago where she tripped over cord and fell hitting her ribs on the coffee table. No complaints of SOB. Pt also suffered laceration to left hand. Pt does not remember when her last time tetanus shot was and elects to receive one today.    Past Medical History  Diagnosis Date  . Coronary artery disease   . PAD (peripheral artery disease)   . Depression   . COPD (chronic obstructive pulmonary disease)   . Hyperlipidemia   . Hypertension    Past Surgical History  Procedure Laterality Date  . Appendectomy     Family History  Problem Relation Age of Onset  . Heart failure Mother   . Heart attack Mother     mother died at 67 of an MI  . Heart attack Father     father died at 21 of an MI  . Heart attack Brother     Brother had an MI in his 49s   History  Substance Use Topics  . Smoking status: Current Every Day Smoker -- 0.50 packs/day for 20 years    Types: Cigarettes  . Smokeless tobacco: Never Used  . Alcohol Use: Yes     Comment: occasional   OB History    Gravida Para Term Preterm AB TAB SAB Ectopic Multiple Living   4 3 3  1  1   3      Review of Systems  Respiratory: Positive for cough. Negative for shortness of breath.        Cough for 3-4 weeks  Musculoskeletal: Positive for arthralgias.       Left rib pain.   Skin: Positive for wound.       Laceration to left hand.   All other systems reviewed and are  negative.     Allergies  Benadryl and Chantix  Home Medications   Prior to Admission medications   Medication Sig Start Date End Date Taking? Authorizing Provider  albuterol (VENTOLIN HFA) 108 (90 BASE) MCG/ACT inhaler Inhale 2 puffs into the lungs every 6 (six) hours as needed. For shortness of breath     Historical Provider, MD  ALPRAZolam Duanne Moron) 1 MG tablet Take 1 mg by mouth 4 (four) times daily as needed. For anxiety    Historical Provider, MD  aspirin EC 81 MG tablet Take 81 mg by mouth daily.      Historical Provider, MD  carvedilol (COREG) 12.5 MG tablet Take 12.5 mg by mouth 2 (two) times daily.      Historical Provider, MD  citalopram (CELEXA) 40 MG tablet Take 40 mg by mouth daily.    Historical Provider, MD  HYDROcodone-acetaminophen (NORCO/VICODIN) 5-325 MG per tablet Take 1 tablet by mouth every 6 (six) hours as needed for moderate pain. 11/21/13   Carole Civil, MD  lisinopril (PRINIVIL,ZESTRIL) 20 MG tablet Take 20 mg by mouth  daily.      Historical Provider, MD  oxyCODONE-acetaminophen (PERCOCET/ROXICET) 5-325 MG per tablet Take 1 tablet by mouth every 4 (four) hours as needed for severe pain. 11/15/13   Carole Civil, MD  pantoprazole (PROTONIX) 40 MG tablet Take 40 mg by mouth 2 (two) times daily.    Historical Provider, MD  pravastatin (PRAVACHOL) 40 MG tablet Take 40 mg by mouth at bedtime.    Historical Provider, MD  pregabalin (LYRICA) 300 MG capsule Take 300 mg by mouth 2 (two) times daily.    Historical Provider, MD  traMADol (ULTRAM) 50 MG tablet Take 1 tablet (50 mg total) by mouth once. 11/28/14   Garald Balding, NP  traZODone (DESYREL) 100 MG tablet Take 100 mg by mouth at bedtime.    Historical Provider, MD   BP 134/91 mmHg  Pulse 85  Temp(Src) 98.1 F (36.7 C) (Oral)  Resp 18  SpO2 98% Physical Exam  Constitutional: She is oriented to person, place, and time. She appears well-developed and well-nourished.  HENT:  Head: Normocephalic.  Eyes:  Pupils are equal, round, and reactive to light.  Neck: Normal range of motion.  Cardiovascular: Normal rate and regular rhythm.   Pulmonary/Chest: Effort normal.   She exhibits tenderness.  Musculoskeletal: Normal range of motion.  Neurological: She is alert and oriented to person, place, and time.  Skin: Skin is warm and dry. No erythema.  Superficial laceration scabbed over dorsum of L hand   Nursing note and vitals reviewed.   ED Course  Procedures (including critical care time) Labs Review Labs Reviewed - No data to display  Imaging Review Dg Ribs Unilateral W/chest Left  11/28/2014   CLINICAL DATA:  Tripped and fell, left sided rib pain  EXAM: LEFT RIBS AND CHEST - 3+ VIEW  COMPARISON:  08/10/2013  FINDINGS: No fracture or other bone lesions are seen involving the ribs. There is no evidence of pneumothorax or pleural effusion. Both lungs are clear. Heart size and mediastinal contours are within normal limits. Remote deformity of the right posterior ninth rib and left anterior seventh rib noted.  IMPRESSION: No acute abnormality.   Electronically Signed   By: Conchita Paris M.D.   On: 11/28/2014 20:50     EKG Interpretation None     Medications  Tdap (BOOSTRIX) injection 0.5 mL (0.5 mLs Intramuscular Given 11/28/14 2037)  traMADol (ULTRAM) tablet 50 mg (50 mg Oral Given 11/28/14 2034)    8:22 PM- Treatment plan was discussed with patient who verbalizes understanding and agrees.   MDM  Given Rx fro Ultram updated Tetanus Final diagnoses:  Rib contusion, left, initial encounter  Abrasion  Tetanus toxoid vaccination administered at current visit       I personally performed the services described in this documentation, which was scribed in my presence. The recorded information has been reviewed and is accurate.   Garald Balding, NP 11/28/14 2103  Garald Balding, NP 11/28/14 2106  Tanna Furry, MD 12/07/14 [phone]

## 2014-12-27 DIAGNOSIS — D649 Anemia, unspecified: Secondary | ICD-10-CM | POA: Insufficient documentation

## 2014-12-27 DIAGNOSIS — Z9181 History of falling: Secondary | ICD-10-CM | POA: Insufficient documentation

## 2015-01-26 DIAGNOSIS — M8580 Other specified disorders of bone density and structure, unspecified site: Secondary | ICD-10-CM | POA: Insufficient documentation

## 2015-01-26 DIAGNOSIS — K219 Gastro-esophageal reflux disease without esophagitis: Secondary | ICD-10-CM | POA: Insufficient documentation

## 2015-08-24 ENCOUNTER — Encounter: Payer: Self-pay | Admitting: Emergency Medicine

## 2015-08-24 ENCOUNTER — Emergency Department
Admission: EM | Admit: 2015-08-24 | Discharge: 2015-08-24 | Disposition: A | Discharge status: Other Acute Inpatient Hospital | Payer: Medicaid Other | Attending: Emergency Medicine | Admitting: Emergency Medicine

## 2015-08-24 ENCOUNTER — Emergency Department: Payer: Medicaid Other

## 2015-08-24 DIAGNOSIS — Z720 Tobacco use: Secondary | ICD-10-CM | POA: Diagnosis not present

## 2015-08-24 DIAGNOSIS — Z7982 Long term (current) use of aspirin: Secondary | ICD-10-CM | POA: Insufficient documentation

## 2015-08-24 DIAGNOSIS — Y998 Other external cause status: Secondary | ICD-10-CM | POA: Insufficient documentation

## 2015-08-24 DIAGNOSIS — Y9289 Other specified places as the place of occurrence of the external cause: Secondary | ICD-10-CM | POA: Diagnosis not present

## 2015-08-24 DIAGNOSIS — Z7902 Long term (current) use of antithrombotics/antiplatelets: Secondary | ICD-10-CM | POA: Insufficient documentation

## 2015-08-24 DIAGNOSIS — S0083XA Contusion of other part of head, initial encounter: Secondary | ICD-10-CM | POA: Diagnosis not present

## 2015-08-24 DIAGNOSIS — Y9389 Activity, other specified: Secondary | ICD-10-CM | POA: Diagnosis not present

## 2015-08-24 DIAGNOSIS — I10 Essential (primary) hypertension: Secondary | ICD-10-CM | POA: Insufficient documentation

## 2015-08-24 DIAGNOSIS — S41112A Laceration without foreign body of left upper arm, initial encounter: Secondary | ICD-10-CM

## 2015-08-24 DIAGNOSIS — S59912A Unspecified injury of left forearm, initial encounter: Secondary | ICD-10-CM | POA: Diagnosis present

## 2015-08-24 DIAGNOSIS — F1510 Other stimulant abuse, uncomplicated: Secondary | ICD-10-CM | POA: Diagnosis not present

## 2015-08-24 DIAGNOSIS — F1310 Sedative, hypnotic or anxiolytic abuse, uncomplicated: Secondary | ICD-10-CM | POA: Diagnosis not present

## 2015-08-24 DIAGNOSIS — W1839XA Other fall on same level, initial encounter: Secondary | ICD-10-CM | POA: Diagnosis not present

## 2015-08-24 DIAGNOSIS — S062X0A Diffuse traumatic brain injury without loss of consciousness, initial encounter: Secondary | ICD-10-CM | POA: Diagnosis not present

## 2015-08-24 DIAGNOSIS — Z79899 Other long term (current) drug therapy: Secondary | ICD-10-CM | POA: Diagnosis not present

## 2015-08-24 DIAGNOSIS — W19XXXA Unspecified fall, initial encounter: Secondary | ICD-10-CM

## 2015-08-24 DIAGNOSIS — I629 Nontraumatic intracranial hemorrhage, unspecified: Secondary | ICD-10-CM

## 2015-08-24 LAB — BASIC METABOLIC PANEL
ANION GAP: 10 (ref 5–15)
BUN: 15 mg/dL (ref 6–20)
CALCIUM: 9.4 mg/dL (ref 8.9–10.3)
CO2: 23 mmol/L (ref 22–32)
Chloride: 99 mmol/L — ABNORMAL LOW (ref 101–111)
Creatinine, Ser: 1.23 mg/dL — ABNORMAL HIGH (ref 0.44–1.00)
GFR, EST AFRICAN AMERICAN: 55 mL/min — AB (ref >60)
GFR, EST NON AFRICAN AMERICAN: 47 mL/min — AB (ref >60)
Glucose, Bld: 78 mg/dL (ref 65–99)
Potassium: 3.9 mmol/L (ref 3.5–5.1)
Sodium: 132 mmol/L — ABNORMAL LOW (ref 135–145)

## 2015-08-24 LAB — URINE DRUG SCREEN, QUALITATIVE (ARMC ONLY)
AMPHETAMINES, UR SCREEN: POSITIVE — AB
Barbiturates, Ur Screen: NOT DETECTED
Benzodiazepine, Ur Scrn: POSITIVE — AB
Cannabinoid 50 Ng, Ur ~~LOC~~: NOT DETECTED
Cocaine Metabolite,Ur ~~LOC~~: NOT DETECTED
MDMA (ECSTASY) UR SCREEN: NOT DETECTED
Methadone Scn, Ur: NOT DETECTED
Opiate, Ur Screen: NOT DETECTED
Phencyclidine (PCP) Ur S: NOT DETECTED
TRICYCLIC, UR SCREEN: NOT DETECTED

## 2015-08-24 LAB — URINALYSIS COMPLETE WITH MICROSCOPIC (ARMC ONLY)
Bilirubin Urine: NEGATIVE
GLUCOSE, UA: NEGATIVE mg/dL
Ketones, ur: NEGATIVE mg/dL
Nitrite: POSITIVE — AB
Protein, ur: NEGATIVE mg/dL
SPECIFIC GRAVITY, URINE: 1.006 (ref 1.005–1.030)
pH: 6 (ref 5.0–8.0)

## 2015-08-24 LAB — TROPONIN I: TROPONIN I: 0.03 ng/mL (ref <0.031)

## 2015-08-24 LAB — CBC
HCT: 35.5 % (ref 35.0–47.0)
HEMOGLOBIN: 11.8 g/dL — AB (ref 12.0–16.0)
MCH: 28.6 pg (ref 26.0–34.0)
MCHC: 33.2 g/dL (ref 32.0–36.0)
MCV: 86.2 fL (ref 80.0–100.0)
Platelets: 214 10*3/uL (ref 150–440)
RBC: 4.11 MIL/uL (ref 3.80–5.20)
RDW: 14.1 % (ref 11.5–14.5)
WBC: 12.8 10*3/uL — ABNORMAL HIGH (ref 3.6–11.0)

## 2015-08-24 LAB — PROTIME-INR
INR: 1.04
PROTHROMBIN TIME: 13.8 s (ref 11.4–15.0)

## 2015-08-24 LAB — APTT: aPTT: 38 seconds — ABNORMAL HIGH (ref 24–36)

## 2015-08-24 LAB — ETHANOL: Alcohol, Ethyl (B): <5 mg/dL (ref <5)

## 2015-08-24 MED ORDER — LISINOPRIL 20 MG PO TABS
20.0000 mg | ORAL_TABLET | Freq: Once | ORAL | Status: AC
  Administered 2015-08-24: 20 mg via ORAL
  Filled 2015-08-24: qty 1

## 2015-08-24 MED ORDER — SODIUM CHLORIDE 0.9 % IV BOLUS (SEPSIS)
500.0000 mL | Freq: Once | INTRAVENOUS | Status: AC
  Administered 2015-08-24: 500 mL via INTRAVENOUS

## 2015-08-24 MED ORDER — LABETALOL HCL 5 MG/ML IV SOLN
20.0000 mg | Freq: Once | INTRAVENOUS | Status: AC
  Administered 2015-08-24: 20 mg via INTRAVENOUS
  Filled 2015-08-24: qty 4

## 2015-08-24 MED ORDER — CARVEDILOL 6.25 MG PO TABS
12.5000 mg | ORAL_TABLET | Freq: Once | ORAL | Status: AC
  Administered 2015-08-24: 12.5 mg via ORAL
  Filled 2015-08-24: qty 2

## 2015-08-24 MED ORDER — ACETAMINOPHEN 325 MG PO TABS
650.0000 mg | ORAL_TABLET | Freq: Once | ORAL | Status: AC
  Administered 2015-08-24: 650 mg via ORAL
  Filled 2015-08-24: qty 2

## 2015-08-24 MED ORDER — CIPROFLOXACIN HCL 500 MG PO TABS
500.0000 mg | ORAL_TABLET | Freq: Once | ORAL | Status: AC
  Administered 2015-08-24: 500 mg via ORAL
  Filled 2015-08-24: qty 1

## 2015-08-24 NOTE — ED Notes (Signed)
Pt to rm 12 via EMS.  EMS reports pt fell x 3 this morning.  Pt reports lower back pain and left foot pain preceding falls.  Some swelling noted to left foot.  Abrasions noted to forearms bilaterally and laceration 3" laceration noted to left forearm, EMS states injuries due to fall.  Pt A&O x 4 but some slowed speech.  Pt NAD at this time.

## 2015-08-24 NOTE — ED Notes (Signed)
Patient transported to CT 

## 2015-08-24 NOTE — ED Provider Notes (Addendum)
[facility] Emergency Department Provider Note  ____________________________________________  Time seen: Approximately 8:13 AM  I have reviewed the triage vital signs and the nursing notes.   HISTORY  Chief Complaint Fall    HPI Traci Duran is a 59 y.o. female with a history of apparently noninterventional CAD, hypertension,peripheral artery disease, depression, COPD he takes Plavix and aspirin. This morning she got up to take the dog out and she fell. Patient is somewhat unreliable in her history about what exactly happened. She does not believe there is loss of consciousness. She does not have any neck pain or numbness or weakness. She does complain of pain in her lower back which is chronic. She has an abrasion to her left arm. She has a slight headache. She states that her tetanus is up-to-date  Past Medical History  Diagnosis Date  . Coronary artery disease   . PAD (peripheral artery disease)   . Depression   . COPD (chronic obstructive pulmonary disease)   . Hyperlipidemia   . Hypertension     Patient Active Problem List   Diagnosis Date Noted  . Greater tuberosity of humerus fracture 10/24/2013  . Unstable angina 10/14/2011  . Hyperlipidemia   . COPD (chronic obstructive pulmonary disease)   . Depression   . PAD (peripheral artery disease)   . Coronary artery disease     Past Surgical History  Procedure Laterality Date  . Appendectomy      Current Outpatient Rx  Name  Route  Sig  Dispense  Refill  . albuterol (VENTOLIN HFA) 108 (90 BASE) MCG/ACT inhaler   Inhalation   Inhale 2 puffs into the lungs every 6 (six) hours as needed. For shortness of breath          . ALPRAZolam (XANAX) 1 MG tablet   Oral   Take 1 mg by mouth 4 (four) times daily as needed. For anxiety         . aspirin EC 81 MG tablet   Oral   Take 81 mg by mouth daily.           Marland Kitchen atorvastatin (LIPITOR) 80 MG tablet   Oral   Take 80 mg by mouth daily.          . busPIRone (BUSPAR) 10 MG tablet   Oral   Take 10 mg by mouth 2 (two) times daily.         . carvedilol (COREG) 12.5 MG tablet   Oral   Take 12.5 mg by mouth 2 (two) times daily.           . citalopram (CELEXA) 40 MG tablet   Oral   Take 40 mg by mouth daily.         . clopidogrel (PLAVIX) 75 MG tablet   Oral   Take 75 mg by mouth daily.         Marland Kitchen lisinopril (PRINIVIL,ZESTRIL) 20 MG tablet   Oral   Take 20 mg by mouth daily.           Marland Kitchen oxyCODONE-acetaminophen (PERCOCET/ROXICET) 5-325 MG per tablet   Oral   Take 1 tablet by mouth every 4 (four) hours as needed for severe pain.   30 tablet   0   . pregabalin (LYRICA) 300 MG capsule   Oral   Take 300 mg by mouth 2 (two) times daily.         Marland Kitchen HYDROcodone-acetaminophen (NORCO/VICODIN) 5-325 MG per tablet   Oral  Take 1 tablet by mouth every 6 (six) hours as needed for moderate pain.   90 tablet   0   . pantoprazole (PROTONIX) 40 MG tablet   Oral   Take 40 mg by mouth 2 (two) times daily.         . pravastatin (PRAVACHOL) 40 MG tablet   Oral   Take 40 mg by mouth at bedtime.         . traMADol (ULTRAM) 50 MG tablet   Oral   Take 1 tablet (50 mg total) by mouth once.   30 tablet   0   . traZODone (DESYREL) 100 MG tablet   Oral   Take 100 mg by mouth at bedtime.           Allergies Benadryl and Chantix  Family History  Problem Relation Age of Onset  . Heart failure Mother   . Heart attack Mother     mother died at 42 of an MI  . Heart attack Father     father died at 103 of an MI  . Heart attack Brother     Brother had an MI in his 71s    Social History Social History  Substance Use Topics  . Smoking status: Current Every Day Smoker -- 0.50 packs/day for 20 years    Types: Cigarettes  . Smokeless tobacco: Never Used  . Alcohol Use: Yes     Comment: occasional    Review of Systems Constitutional: No fever/chills Eyes: No visual changes. ENT: No sore  throat. Cardiovascular: Denies chest pain. Respiratory: Denies shortness of breath. Gastrointestinal: No abdominal pain.  No nausea, no vomiting.  No diarrhea.  No constipation. Genitourinary: Negative for dysuria. Musculoskeletal: Negative for back pain. Skin: Negative for rash. Neurological: Negative for significant headaches, focal weakness or numbness. 10-point ROS otherwise negative.  ____________________________________________   PHYSICAL EXAM:  VITAL SIGNS: ED Triage Vitals  Enc Vitals Group     BP 08/24/15 0636 154/98 mmHg     Pulse Rate 08/24/15 0636 93     Resp 08/24/15 0636 16     Temp 08/24/15 0636 97.7 F (36.5 C)     Temp Source 08/24/15 0636 Oral     SpO2 08/24/15 0636 97 %     Weight 08/24/15 0636 130 lb (58.968 kg)     Height 08/24/15 0636 5\' 7"  (1.702 m)     Head Cir --      Peak Flow --      Pain Score 08/24/15 0637 8     Pain Loc --      Pain Edu? --      Excl. in [place]? --     Constitutional: Alert and oriented. Well appearing and in no acute distress. She was somewhat slow to respond but GCS is 15 Eyes: Conjunctivae are normal. PERRL. EOMI. Head: There is a hematoma to the left frontal region of the head with no evidence of skull fracture. Nose: No congestion/rhinnorhea. Mouth/Throat: Mucous membranes are moist.  Oropharynx non-erythematous. Neck: No stridor.  There is no tenderness to the cervical spine shows full painless range of motion Cardiovascular: Normal rate, regular rhythm. Grossly normal heart sounds.  Good peripheral circulation. Respiratory: Normal respiratory effort.  No retractions. Lungs CTAB. Gastrointestinal: Soft and nontender. No distention. No abdominal bruits. No CVA tenderness. Musculoskeletal: No lower extremity tenderness nor edema.  No joint effusions. Neurologic:  Normal speech and language. No gross focal neurologic deficits are appreciated. No gait instability. Renal nerves  II through XII are grossly intact, 5 out of 5  strength bilateral upper and lower extremity, sensation is normal. ECS is 15, there is no obvious sensation loss. Saddle anesthesia. Skin:  Skin is warm, dry and intact. No rash noted. There is a small tear to the left forearm, very superficial. Psychiatric: Mood and affect are normal. Speech and behavior are normal.  ____________________________________________   LABS (all labs ordered are listed, but only abnormal results are displayed)  Labs Reviewed  BASIC METABOLIC PANEL - Abnormal; Notable for the following:    Sodium 132 (*)    Chloride 99 (*)    Creatinine, Ser 1.23 (*)    GFR calc non Af Amer 47 (*)    GFR calc Af Amer 55 (*)    All other components within normal limits  CBC - Abnormal; Notable for the following:    WBC 12.8 (*)    Hemoglobin 11.8 (*)    All other components within normal limits  URINE DRUG SCREEN, QUALITATIVE ([facility] ONLY) - Abnormal; Notable for the following:    Amphetamines, Ur Screen POSITIVE (*)    Benzodiazepine, Ur Scrn POSITIVE (*)    All other components within normal limits  URINALYSIS COMPLETEWITH MICROSCOPIC ([facility] ONLY) - Abnormal; Notable for the following:    Color, Urine YELLOW (*)    APPearance HAZY (*)    Hgb urine dipstick 2+ (*)    Nitrite POSITIVE (*)    Leukocytes, UA 3+ (*)    Bacteria, UA MANY (*)    Squamous Epithelial / LPF 0-5 (*)    All other components within normal limits  URINE CULTURE  TROPONIN I  ETHANOL  APTT  PROTIME-INR   ____________________________________________  EKG  I personally reviewed the EKG, normal sinus rhythm rate 92 bpm no acute ST elevation or acute ST depression normal axis unremarkable ____________________________________________  RADIOLOGY I have reviewed x-rays  ____________________________________________   PROCEDURES  Procedure(s) performed: None  Critical Care performed: CRITICAL CARE Performed by: Schuyler Amor   Total critical care time: 50 minutes  Critical care time  was exclusive of separately billable procedures and treating other patients.  Critical care was necessary to treat or prevent imminent or life-threatening deterioration.  Critical care was time spent personally by me on the following activities: development of treatment plan with patient and/or surrogate as well as nursing, discussions with consultants, evaluation of patient's response to treatment, examination of patient, obtaining history from patient or surrogate, ordering and performing treatments and interventions, ordering and review of laboratory studies, ordering and review of radiographic studies, pulse oximetry and re-evaluation of patient's condition. None  ____________________________________________   INITIAL IMPRESSION / ASSESSMENT AND PLAN / ED COURSE  Pertinent labs & imaging results that were available during my care of the patient were reviewed by me and considered in my medical decision making (see chart for details).  Patient with a very small 15 mm head bleed. Likely hypertensive in distribution although blood pressure is well controlled here area patient remains awake and alert serial neurologic exams are reassuring. Patient is requesting transfer to [facility] for this and we have called and they have accepted the patient.  Dr. Butler Denmark, fellow accepted for Dr. Marcos Eke They have no other advice or input on changing management at this time. Patient is on Plavix and aspirin and therefore has a significant risk of decompensation. However, at this time she is stable. The pressure does not require any intervention at this time. Given the  fact that she has a head injury and a head bleed, I will obtain a CT of the cervical spine although clinically she does not have any evidence of fracture. Patient has a small skin tear to the left forearm which we will repair with Steri-Strips. We have irrigated it, copiously. Patient does not have any other obvious bony injuries  but I am reluctant to have her walk at this time so further evaluation of her peripheral will need to be obtained. However, she has full painless range of motion of all joints and no evidence of boney injury. Abdomen is benign. There is no evidence of acute coronary syndrome on EKG. Blood work is reassuring. Platelet count is noted however again Plavix and aspirin are present, patient did take them yesterday. Td per pt is utd  ____________________________________________  ----------------------------------------- 9:01 AM on 08/24/2015 -----------------------------------------  Patient remains neurologically intact at this time, blood pressure is starting to creep up. We will give her labetalol injection, if it remains elevated we will also then started her on a Cardene drip. Patient still has a GCS of 15 and is in no acute distress. Awaiting transport. We will also reinstitute her home medications.  ----------------------------------------- 9:38 AM on 08/24/2015 -----------------------------------------  Patient is noted to have a UTI. This could be secondary to a poor catch however we will give her Cipro prior to to transfer. On discharge, she remains neurologically intact with a GCS of 15. Blood pressures responded very well to our single dose of labetalol.    FINAL CLINICAL IMPRESSION(S) / ED DIAGNOSES  Final diagnoses:  None     Schuyler Amor, MD 08/24/15 0827  Schuyler Amor, MD 08/24/15 1505  Schuyler Amor, MD 08/24/15 [place], MD 08/24/15 1002

## 2015-08-25 DIAGNOSIS — I610 Nontraumatic intracerebral hemorrhage in hemisphere, subcortical: Secondary | ICD-10-CM | POA: Insufficient documentation

## 2015-08-26 LAB — URINE CULTURE: Culture: >=100000

## 2015-08-27 DIAGNOSIS — F329 Major depressive disorder, single episode, unspecified: Secondary | ICD-10-CM | POA: Insufficient documentation

## 2015-08-29 DIAGNOSIS — E0789 Other specified disorders of thyroid: Secondary | ICD-10-CM | POA: Insufficient documentation

## 2015-08-29 DIAGNOSIS — E079 Disorder of thyroid, unspecified: Secondary | ICD-10-CM | POA: Insufficient documentation

## 2015-08-29 DIAGNOSIS — E559 Vitamin D deficiency, unspecified: Secondary | ICD-10-CM | POA: Insufficient documentation

## 2015-12-04 DIAGNOSIS — Z87820 Personal history of traumatic brain injury: Secondary | ICD-10-CM | POA: Insufficient documentation

## 2015-12-04 DIAGNOSIS — S0636AA Traumatic hemorrhage of cerebrum, unspecified, with loss of consciousness status unknown, initial encounter: Secondary | ICD-10-CM | POA: Insufficient documentation

## 2015-12-04 DIAGNOSIS — S06369A Traumatic hemorrhage of cerebrum, unspecified, with loss of consciousness of unspecified duration, initial encounter: Secondary | ICD-10-CM | POA: Insufficient documentation

## 2015-12-12 ENCOUNTER — Ambulatory Visit: Payer: Medicaid Other

## 2016-01-18 DIAGNOSIS — R911 Solitary pulmonary nodule: Secondary | ICD-10-CM | POA: Insufficient documentation

## 2016-03-09 ENCOUNTER — Emergency Department: Payer: Medicaid Other

## 2016-03-09 ENCOUNTER — Emergency Department
Admission: EM | Admit: 2016-03-09 | Discharge: 2016-03-10 | Disposition: A | Discharge status: Home / Self Care | Payer: Medicaid Other | Attending: Emergency Medicine | Admitting: Emergency Medicine

## 2016-03-09 DIAGNOSIS — Z7982 Long term (current) use of aspirin: Secondary | ICD-10-CM | POA: Insufficient documentation

## 2016-03-09 DIAGNOSIS — F17210 Nicotine dependence, cigarettes, uncomplicated: Secondary | ICD-10-CM | POA: Insufficient documentation

## 2016-03-09 DIAGNOSIS — Z7901 Long term (current) use of anticoagulants: Secondary | ICD-10-CM | POA: Insufficient documentation

## 2016-03-09 DIAGNOSIS — J449 Chronic obstructive pulmonary disease, unspecified: Secondary | ICD-10-CM | POA: Insufficient documentation

## 2016-03-09 DIAGNOSIS — Z4789 Encounter for other orthopedic aftercare: Secondary | ICD-10-CM

## 2016-03-09 DIAGNOSIS — F329 Major depressive disorder, single episode, unspecified: Secondary | ICD-10-CM | POA: Diagnosis not present

## 2016-03-09 DIAGNOSIS — I2510 Atherosclerotic heart disease of native coronary artery without angina pectoris: Secondary | ICD-10-CM | POA: Diagnosis not present

## 2016-03-09 DIAGNOSIS — F1010 Alcohol abuse, uncomplicated: Secondary | ICD-10-CM | POA: Insufficient documentation

## 2016-03-09 DIAGNOSIS — R42 Dizziness and giddiness: Secondary | ICD-10-CM | POA: Insufficient documentation

## 2016-03-09 DIAGNOSIS — E876 Hypokalemia: Secondary | ICD-10-CM | POA: Diagnosis not present

## 2016-03-09 DIAGNOSIS — I10 Essential (primary) hypertension: Secondary | ICD-10-CM | POA: Insufficient documentation

## 2016-03-09 DIAGNOSIS — R531 Weakness: Secondary | ICD-10-CM | POA: Insufficient documentation

## 2016-03-09 DIAGNOSIS — Z4689 Encounter for fitting and adjustment of other specified devices: Secondary | ICD-10-CM | POA: Diagnosis not present

## 2016-03-09 DIAGNOSIS — Z79899 Other long term (current) drug therapy: Secondary | ICD-10-CM | POA: Diagnosis not present

## 2016-03-09 LAB — COMPREHENSIVE METABOLIC PANEL
ALT: 12 U/L — ABNORMAL LOW (ref 14–54)
AST: 19 U/L (ref 15–41)
Albumin: 4.2 g/dL (ref 3.5–5.0)
Alkaline Phosphatase: 84 U/L (ref 38–126)
Anion gap: 5 (ref 5–15)
BILIRUBIN TOTAL: 0.4 mg/dL (ref 0.3–1.2)
BUN: 10 mg/dL (ref 6–20)
CO2: 26 mmol/L (ref 22–32)
Calcium: 9.4 mg/dL (ref 8.9–10.3)
Chloride: 104 mmol/L (ref 101–111)
Creatinine, Ser: 0.94 mg/dL (ref 0.44–1.00)
GFR calc Af Amer: >60 mL/min (ref >60)
Glucose, Bld: 90 mg/dL (ref 65–99)
Potassium: 2.7 mmol/L — CL (ref 3.5–5.1)
Sodium: 135 mmol/L (ref 135–145)
TOTAL PROTEIN: 7.1 g/dL (ref 6.5–8.1)

## 2016-03-09 LAB — CBC WITH DIFFERENTIAL/PLATELET
BASOS ABS: 0.1 10*3/uL (ref 0–0.1)
Basophils Relative: 1 %
EOS PCT: 2 %
Eosinophils Absolute: 0.2 10*3/uL (ref 0–0.7)
HEMATOCRIT: 37 % (ref 35.0–47.0)
Hemoglobin: 12.5 g/dL (ref 12.0–16.0)
LYMPHS ABS: 3.4 10*3/uL (ref 1.0–3.6)
Lymphocytes Relative: 33 %
MCH: 28.4 pg (ref 26.0–34.0)
MCHC: 33.8 g/dL (ref 32.0–36.0)
MCV: 84.1 fL (ref 80.0–100.0)
MONO ABS: 0.7 10*3/uL (ref 0.2–0.9)
MONOS PCT: 7 %
NEUTROS ABS: 5.8 10*3/uL (ref 1.4–6.5)
Neutrophils Relative %: 57 %
PLATELETS: 259 10*3/uL (ref 150–440)
RBC: 4.4 MIL/uL (ref 3.80–5.20)
RDW: 15.7 % — ABNORMAL HIGH (ref 11.5–14.5)
WBC: 10.2 10*3/uL (ref 3.6–11.0)

## 2016-03-09 LAB — URINALYSIS COMPLETE WITH MICROSCOPIC (ARMC ONLY)
Bilirubin Urine: NEGATIVE
Glucose, UA: NEGATIVE mg/dL
HGB URINE DIPSTICK: NEGATIVE
KETONES UR: NEGATIVE mg/dL
Nitrite: NEGATIVE
PH: 6 (ref 5.0–8.0)
Protein, ur: NEGATIVE mg/dL
SPECIFIC GRAVITY, URINE: 1.011 (ref 1.005–1.030)

## 2016-03-09 LAB — TROPONIN I

## 2016-03-09 LAB — CK: Total CK: 40 U/L (ref 38–234)

## 2016-03-09 MED ORDER — ACETAMINOPHEN 325 MG PO TABS
650.0000 mg | ORAL_TABLET | Freq: Once | ORAL | Status: AC
  Administered 2016-03-09: 650 mg via ORAL
  Filled 2016-03-09: qty 2

## 2016-03-09 NOTE — ED Notes (Signed)
Pt complaining that she was brought to the "wrong part of the hospital". Advised she was brought into the faster care area b/c of her report to triage that she was here to have her cast removed. Pt stated she only told them (triage) that so she could get done faster. Iv established and labs drawn. Pt ambulated to bathroom for sample

## 2016-03-09 NOTE — ED Notes (Signed)
Pt to xray

## 2016-03-09 NOTE — ED Notes (Signed)
Pt now complains of headache. Order for tylenol received.

## 2016-03-09 NOTE — ED Notes (Signed)
Patient reports has surgery on right arm approximately 1 month ago.  Patient reports was supposed to follow up last week and have cast removed and x-ray but did not have a way there.  Patient here tonight asking that cast be removed due to pain.

## 2016-03-09 NOTE — ED Notes (Signed)
Pt states to this rn, has been dizzy, lightheaded, sweating for 3 days. Pt also complains of generalized weakness. Pt appears in no acute distress, denies chest pain at this time. Triplett, fnp in to assess pt.

## 2016-03-09 NOTE — ED Notes (Signed)
Critical potassium of 2.7 called from lab. Triplett, fnp notified. Charge rn notified of pt's status, charge rn to find bed on main side of ed for continued cardiac monitoring.

## 2016-03-10 MED ORDER — OXYCODONE HCL 5 MG PO TABS
10.0000 mg | ORAL_TABLET | Freq: Once | ORAL | Status: AC
  Administered 2016-03-10: 10 mg via ORAL

## 2016-03-10 MED ORDER — POTASSIUM CHLORIDE CRYS ER 20 MEQ PO TBCR
40.0000 meq | EXTENDED_RELEASE_TABLET | Freq: Once | ORAL | Status: AC
  Administered 2016-03-10: 40 meq via ORAL
  Filled 2016-03-10: qty 2

## 2016-03-10 MED ORDER — OXYCODONE HCL 5 MG PO TABS
ORAL_TABLET | ORAL | Status: DC
Start: 2016-03-10 — End: 2016-03-10
  Filled 2016-03-10: qty 2

## 2016-03-10 MED ORDER — OXYCODONE-ACETAMINOPHEN 5-325 MG PO TABS: 2.0000 | ORAL_TABLET | Freq: Once | ORAL | Status: DC

## 2016-03-10 MED ORDER — POTASSIUM CHLORIDE ER 10 MEQ PO TBCR: 20.0000 meq | EXTENDED_RELEASE_TABLET | Freq: Every day | ORAL | Status: DC

## 2016-03-10 MED ORDER — OXYCODONE HCL 5 MG PO TABS: 5.0000 mg | ORAL_TABLET | Freq: Three times a day (TID) | ORAL | Status: AC | PRN

## 2016-03-10 NOTE — ED Provider Notes (Addendum)
CSN: [id number]     Arrival date & time 03/09/16  1938 History   First MD Initiated Contact with Patient 03/09/16 2142     Chief Complaint  Patient presents with  . Post-op Problem     (Consider location/radiation/quality/duration/timing/severity/associated sxs/prior Treatment) HPI  60 year old female who presents to the emergency department for evaluation of multiple medical complaints. She states that she fractured her right arm in January, but did not have surgery until about 3 weeks ago. Her initial complain triage was that she missed her follow-up appointment with orthopedics and that it is time for her cast to be taken off and she is here for cast removal. Now that she is back in the treatment area, she reports that she is having weakness, dizziness, and just doesn't feel well overall. She states that she has a follow-up appointment with her primary care provider next week, but does not feel like she is able to wait to be evaluated. She states that all of her specialists including her cardiologist and orthopedic surgeon are near [place] and she is not able to find a ride.  Past Medical History  Diagnosis Date  . Coronary artery disease   . PAD (peripheral artery disease)   . Depression   . COPD (chronic obstructive pulmonary disease)   . Hyperlipidemia   . Hypertension    Past Surgical History  Procedure Laterality Date  . Appendectomy     Family History  Problem Relation Age of Onset  . Heart failure Mother   . Heart attack Mother     mother died at 31 of an MI  . Heart attack Father     father died at 81 of an MI  . Heart attack Brother     Brother had an MI in his 74s   Social History  Substance Use Topics  . Smoking status: Current Every Day Smoker -- 0.50 packs/day for 20 years    Types: Cigarettes  . Smokeless tobacco: Never Used  . Alcohol Use: Yes     Comment: occasional   OB History    Gravida Para Term Preterm AB TAB SAB Ectopic Multiple Living   4 3 3   1  1   3      Review of Systems  Constitutional: Negative for fever and chills.  Respiratory: Negative.   Gastrointestinal: Negative for nausea, vomiting, abdominal pain and diarrhea.  Genitourinary: Negative for frequency.  Musculoskeletal: Positive for myalgias.  Skin: Negative for color change.  Psychiatric/Behavioral: Negative for confusion.      Allergies  Benadryl and Chantix  Home Medications   Prior to Admission medications   Medication Sig Start Date End Date Taking? Authorizing Provider  albuterol (VENTOLIN HFA) 108 (90 BASE) MCG/ACT inhaler Inhale 2 puffs into the lungs every 6 (six) hours as needed. For shortness of breath     Historical Provider, MD  ALPRAZolam Duanne Moron) 1 MG tablet Take 1 mg by mouth 4 (four) times daily as needed. For anxiety    Historical Provider, MD  aspirin EC 81 MG tablet Take 81 mg by mouth daily.      Historical Provider, MD  atorvastatin (LIPITOR) 80 MG tablet Take 80 mg by mouth daily.    Historical Provider, MD  busPIRone (BUSPAR) 10 MG tablet Take 10 mg by mouth 2 (two) times daily.    Historical Provider, MD  carvedilol (COREG) 12.5 MG tablet Take 12.5 mg by mouth 2 (two) times daily.      Historical Provider, MD  citalopram (CELEXA) 40 MG tablet Take 40 mg by mouth daily.    Historical Provider, MD  clopidogrel (PLAVIX) 75 MG tablet Take 75 mg by mouth daily.    Historical Provider, MD  HYDROcodone-acetaminophen (NORCO/VICODIN) 5-325 MG per tablet Take 1 tablet by mouth every 6 (six) hours as needed for moderate pain. 11/21/13   Carole Civil, MD  lisinopril (PRINIVIL,ZESTRIL) 20 MG tablet Take 20 mg by mouth daily.      Historical Provider, MD  oxyCODONE (ROXICODONE) 5 MG immediate release tablet Take 1 tablet (5 mg total) by mouth every 8 (eight) hours as needed. 03/10/16 03/10/17  Victorino Dike, FNP  oxyCODONE-acetaminophen (PERCOCET/ROXICET) 5-325 MG per tablet Take 1 tablet by mouth every 4 (four) hours as needed for severe pain.  11/15/13   Carole Civil, MD  pantoprazole (PROTONIX) 40 MG tablet Take 40 mg by mouth 2 (two) times daily.    Historical Provider, MD  potassium chloride (K-DUR) 10 MEQ tablet Take 2 tablets (20 mEq total) by mouth daily. 03/10/16   Victorino Dike, FNP  pravastatin (PRAVACHOL) 40 MG tablet Take 40 mg by mouth at bedtime.    Historical Provider, MD  pregabalin (LYRICA) 300 MG capsule Take 300 mg by mouth 2 (two) times daily.    Historical Provider, MD  traMADol (ULTRAM) 50 MG tablet Take 1 tablet (50 mg total) by mouth once. 11/28/14   Junius Creamer, NP  traZODone (DESYREL) 100 MG tablet Take 100 mg by mouth at bedtime.    Historical Provider, MD   BP 151/91 mmHg  Pulse 76  Temp(Src) 97.8 F (36.6 C) (Oral)  Resp 19  Ht 5\' 7"  (1.702 m)  Wt 58.968 kg  BMI 20.36 kg/m2  SpO2 97% Physical Exam  Constitutional: She is oriented to person, place, and time. She appears well-developed.  Neck: Normal range of motion.  Pulmonary/Chest: Effort normal and breath sounds normal. No respiratory distress.  Musculoskeletal:       Right forearm: She exhibits tenderness. She exhibits no swelling and no edema.       Arms: Neurological: She is alert and oriented to person, place, and time.  Skin: Skin is warm and dry.  Psychiatric: Her behavior is normal.  Nursing note and vitals reviewed.   ED Course  .Splint Application Date/Time: [id number] 12:05 AM Performed by: Sherrie George B Authorized by: Sherrie George B Consent: Verbal consent obtained. Risks and benefits: risks, benefits and alternatives were discussed Consent given by: patient Imaging studies: imaging studies available Location details: right arm Splint type: sugar tong Supplies used: cotton padding and Ortho-Glass Post-procedure: The splinted body part was neurovascularly unchanged following the procedure. Patient tolerance: Patient tolerated the procedure well with no immediate complications   (including critical care time) Labs  Review Labs Reviewed  CBC WITH DIFFERENTIAL/PLATELET - Abnormal; Notable for the following:    RDW 15.7 (*)    All other components within normal limits  COMPREHENSIVE METABOLIC PANEL - Abnormal; Notable for the following:    Potassium 2.7 (*)    ALT 12 (*)    All other components within normal limits  URINALYSIS COMPLETEWITH MICROSCOPIC ([facility] ONLY) - Abnormal; Notable for the following:    Color, Urine YELLOW (*)    APPearance CLEAR (*)    Leukocytes, UA TRACE (*)    Bacteria, UA RARE (*)    Squamous Epithelial / LPF 0-5 (*)    All other components within normal limits  TROPONIN I  CK  Imaging Review Dg Chest 2 View  03/09/2016  CLINICAL DATA:  Acute onset of dizziness, lightheadedness and diaphoresis. Generalized weakness and headache. Initial encounter. EXAM: CHEST  2 VIEW COMPARISON:  Chest radiograph from 08/24/2015 FINDINGS: The lungs are hyperexpanded, with flattening of the hemidiaphragms, compatible with COPD. Chronic peribronchial thickening is noted. There is no evidence of focal opacification, pleural effusion or pneumothorax. The heart is normal in size; the mediastinal contour is within normal limits. No acute osseous abnormalities are seen. IMPRESSION: Findings of COPD.  No acute cardiopulmonary process seen. Electronically Signed   By: Garald Balding M.D.   On: 03/09/2016 23:07   Dg Forearm Right  03/09/2016  CLINICAL DATA:  History of prior fracture with open reduction internal fixation EXAM: RIGHT FOREARM - 2 VIEW COMPARISON:  None. FINDINGS: Cast material is noted which somewhat limits fine bony detail. Prior fixation sideplate with multiple fixation screws are noted along the distal radius and ulna. Some degree of callus formation is noted. No acute abnormality is noted. IMPRESSION: Status post ORIF of distal radial and ulnar fractures. No definitive acute abnormality is seen. Electronically Signed   By: Inez Catalina M.D.   On: 03/09/2016 23:12   I have personally  reviewed and evaluated these images and lab results as part of my medical decision-making.   EKG Interpretation None    Sinus rhythm with a rate of 87. No STEMI.  MDM   Final diagnoses:  Cast discomfort  Hypokalemia    Patient was given 40 mEq of potassium while in the emergency department today. She will receive a prescription to take 20 mEq daily for the next 7 days. She is scheduled to see her primary care provider this week and was advised to bring her discharge papers and current medication list to that appointment. Her daughter was made aware of these instructions as well. Patient and daughter were also advised that she will need to find a way to see her orthopedist near [place] point to have the cast permanently removed. She was advised not to remove the temporary cast was applied today. She was advised that she is at risk for reinjury or nonunion of the bone if the cast is removed too soon. The cast that she came in left appeared as though the top layer had been peeled away and only a single layer of Ortho-Glass and the cotton padding was left. She was advised not to remove any part of this cast that was applied tonight. The patient was instructed to return to the emergency department for symptoms that change or worsen or new concerns if she is unable to see her primary care provider or orthopedist.    Victorino Dike, FNP 03/10/16 [place], MD 03/10/16 [place], FNP 03/19/16 [place], MD 03/22/16 [phone]

## 2016-03-10 NOTE — Discharge Instructions (Signed)
You must follow up with your orthopedist to have the rest of the sutures taken out and to have the cast removed.  Talk to your primary care provider about transferring care closer to home. You will need to discuss your refills with him/her.

## 2016-06-27 DIAGNOSIS — Z79891 Long term (current) use of opiate analgesic: Secondary | ICD-10-CM | POA: Insufficient documentation

## 2016-09-12 ENCOUNTER — Encounter: Payer: Self-pay | Admitting: Emergency Medicine

## 2016-09-12 ENCOUNTER — Emergency Department
Admission: EM | Admit: 2016-09-12 | Discharge: 2016-09-12 | Disposition: A | Discharge status: Home / Self Care | Payer: Medicaid Other | Attending: Emergency Medicine | Admitting: Emergency Medicine

## 2016-09-12 ENCOUNTER — Emergency Department
Admission: EM | Admit: 2016-09-12 | Discharge: 2016-09-12 | Disposition: A | Discharge status: Home / Self Care | Payer: Medicaid Other | Source: Home / Self Care | Attending: Student in an Organized Health Care Education/Training Program | Admitting: Student in an Organized Health Care Education/Training Program

## 2016-09-12 DIAGNOSIS — I2510 Atherosclerotic heart disease of native coronary artery without angina pectoris: Secondary | ICD-10-CM | POA: Insufficient documentation

## 2016-09-12 DIAGNOSIS — I10 Essential (primary) hypertension: Secondary | ICD-10-CM

## 2016-09-12 DIAGNOSIS — Z7982 Long term (current) use of aspirin: Secondary | ICD-10-CM | POA: Insufficient documentation

## 2016-09-12 DIAGNOSIS — J449 Chronic obstructive pulmonary disease, unspecified: Secondary | ICD-10-CM | POA: Insufficient documentation

## 2016-09-12 DIAGNOSIS — F17210 Nicotine dependence, cigarettes, uncomplicated: Secondary | ICD-10-CM | POA: Insufficient documentation

## 2016-09-12 DIAGNOSIS — G8929 Other chronic pain: Secondary | ICD-10-CM | POA: Insufficient documentation

## 2016-09-12 DIAGNOSIS — Z79899 Other long term (current) drug therapy: Secondary | ICD-10-CM

## 2016-09-12 DIAGNOSIS — M545 Low back pain, unspecified: Secondary | ICD-10-CM

## 2016-09-12 HISTORY — DX: Anxiety disorder, unspecified: F41.9

## 2016-09-12 LAB — URINALYSIS COMPLETE WITH MICROSCOPIC (ARMC ONLY)
BILIRUBIN URINE: NEGATIVE
Bacteria, UA: NONE SEEN
GLUCOSE, UA: NEGATIVE mg/dL
HGB URINE DIPSTICK: NEGATIVE
KETONES UR: NEGATIVE mg/dL
Leukocytes, UA: NEGATIVE
NITRITE: NEGATIVE
Protein, ur: NEGATIVE mg/dL
SPECIFIC GRAVITY, URINE: 1.001 — AB (ref 1.005–1.030)
pH: 5 (ref 5.0–8.0)

## 2016-09-12 MED ORDER — HYDROMORPHONE HCL 1 MG/ML IJ SOLN
0.5000 mg | Freq: Once | INTRAMUSCULAR | Status: AC
  Administered 2016-09-12: 0.5 mg via INTRAMUSCULAR
  Filled 2016-09-12: qty 1

## 2016-09-12 MED ORDER — TRAMADOL HCL 50 MG PO TABS: 50.0000 mg | ORAL_TABLET | Freq: Four times a day (QID) | ORAL | 0 refills | Status: DC | PRN

## 2016-09-12 MED ORDER — TRAMADOL HCL 50 MG PO TABS
50.0000 mg | ORAL_TABLET | Freq: Once | ORAL | Status: AC
  Administered 2016-09-12: 50 mg via ORAL
  Filled 2016-09-12: qty 1

## 2016-09-12 NOTE — ED Notes (Signed)
MD at bedside. 

## 2016-09-12 NOTE — ED Notes (Signed)
See triage note  States she is waiting to be picked up   Pain is returning

## 2016-09-12 NOTE — ED Provider Notes (Signed)
[facility] Emergency Department Provider Note  ____________________________________________   First MD Initiated Contact with Patient 09/12/16 [phone]     (approximate)  I have reviewed the triage vital signs and the nursing notes.   HISTORY  Chief Complaint Back Pain    HPI Traci Duran is a 60 y.o. female is here with complaint of back pain. Patient was seen this morning in by Dr. Kerman Passey and discharged at 5:01 AM. Patient was given an injection of Dilaudid which relieved her back pain. Urinalysis was negative at that time. Patient denies any recent injury. She states that she has had some chronic back pain for some time. She states that she has been doing a lot of housework and most likely pulled a muscle. She denies any paresthesias into her lower extremities. She denies any urinary symptoms, or incontinence of bowel or bladder. Patient was walking around in the lobby waiting for her grandson to pick her up after her discharge this morning and began feeling the pain "coming back". Patient inquired about an additional injection of Dilaudid. She was given a prescription for tramadol which she has not filled since her grandson has not picked her up. Patient states that her grandson will not be picking her up for another 1-1/2 hours. Currently she rates her pain an 8 out of 10.   Past Medical History:  Diagnosis Date  . Anxiety   . COPD (chronic obstructive pulmonary disease) ([place])   . Coronary artery disease   . Depression   . Hyperlipidemia   . Hypertension   . PAD (peripheral artery disease) [facility])     Patient Active Problem List   Diagnosis Date Noted  . Greater tuberosity of humerus fracture 10/24/2013  . Unstable angina ([place]) 10/14/2011  . Hyperlipidemia   . COPD (chronic obstructive pulmonary disease) ([place])   . Depression   . PAD (peripheral artery disease) ([place])   . Coronary artery disease     Past Surgical History:  Procedure  Laterality Date  . APPENDECTOMY    . HIP FRACTURE SURGERY      Prior to Admission medications   Medication Sig Start Date End Date Taking? Authorizing Provider  albuterol (VENTOLIN HFA) 108 (90 BASE) MCG/ACT inhaler Inhale 2 puffs into the lungs every 6 (six) hours as needed. For shortness of breath     Historical Provider, MD  ALPRAZolam Duanne Moron) 1 MG tablet Take 1 mg by mouth 4 (four) times daily as needed. For anxiety    Historical Provider, MD  aspirin EC 81 MG tablet Take 81 mg by mouth daily.      Historical Provider, MD  atorvastatin (LIPITOR) 80 MG tablet Take 80 mg by mouth daily.    Historical Provider, MD  busPIRone (BUSPAR) 10 MG tablet Take 10 mg by mouth 2 (two) times daily.    Historical Provider, MD  carvedilol (COREG) 12.5 MG tablet Take 12.5 mg by mouth 2 (two) times daily.      Historical Provider, MD  citalopram (CELEXA) 40 MG tablet Take 40 mg by mouth daily.    Historical Provider, MD  clopidogrel (PLAVIX) 75 MG tablet Take 75 mg by mouth daily.    Historical Provider, MD  HYDROcodone-acetaminophen (NORCO/VICODIN) 5-325 MG per tablet Take 1 tablet by mouth every 6 (six) hours as needed for moderate pain. 11/21/13   Carole Civil, MD  lisinopril (PRINIVIL,ZESTRIL) 20 MG tablet Take 20 mg by mouth daily.      Historical Provider, MD  oxyCODONE (ROXICODONE) 5 MG immediate release tablet Take 1 tablet (5 mg total) by mouth every 8 (eight) hours as needed. 03/10/16 03/10/17  Victorino Dike, FNP  oxyCODONE-acetaminophen (PERCOCET/ROXICET) 5-325 MG per tablet Take 1 tablet by mouth every 4 (four) hours as needed for severe pain. 11/15/13   Carole Civil, MD  pantoprazole (PROTONIX) 40 MG tablet Take 40 mg by mouth 2 (two) times daily.    Historical Provider, MD  potassium chloride (K-DUR) 10 MEQ tablet Take 2 tablets (20 mEq total) by mouth daily. 03/10/16   Victorino Dike, FNP  pravastatin (PRAVACHOL) 40 MG tablet Take 40 mg by mouth at bedtime.    Historical Provider, MD    pregabalin (LYRICA) 300 MG capsule Take 300 mg by mouth 2 (two) times daily.    Historical Provider, MD  traMADol (ULTRAM) 50 MG tablet Take 1 tablet (50 mg total) by mouth every 6 (six) hours as needed. 09/12/16   Harvest Dark, MD  traZODone (DESYREL) 100 MG tablet Take 100 mg by mouth at bedtime.    Historical Provider, MD    Allergies Morphine and related; Benadryl [diphenhydramine hcl]; and Chantix [varenicline]  Family History  Problem Relation Age of Onset  . Heart failure Mother   . Heart attack Mother     mother died at 27 of an MI  . Heart attack Father     father died at 53 of an MI  . Heart attack Brother     Brother had an MI in his 35s    Social History Social History  Substance Use Topics  . Smoking status: Current Some Day Smoker    Packs/day: 0.25    Years: 20.00    Types: Cigarettes  . Smokeless tobacco: Never Used  . Alcohol use No     Comment: occasional    Review of Systems Constitutional: No fever/chills Eyes: No visual changes. Cardiovascular: Denies chest pain. Respiratory: Denies shortness of breath. Gastrointestinal: No abdominal pain.  No nausea, no vomiting.  No diarrhea.  No constipation. Genitourinary: Negative for dysuria. Musculoskeletal: Positive for chronic back pain. Neurological: Negative for headaches, focal weakness or numbness.  10-point ROS otherwise negative.  ____________________________________________   PHYSICAL EXAM:  VITAL SIGNS: ED Triage Vitals  Enc Vitals Group     BP 09/12/16 0810 118/82     Pulse Rate 09/12/16 0810 99     Resp 09/12/16 0810 18     Temp 09/12/16 0810 97.8 F (36.6 C)     Temp Source 09/12/16 0810 Oral     SpO2 09/12/16 0810 99 %     Weight 09/12/16 0810 133 lb (60.3 kg)     Height 09/12/16 0810 5\' 7"  (1.702 m)     Head Circumference --      Peak Flow --      Pain Score 09/12/16 0811 8     Pain Loc --      Pain Edu? --      Excl. in [place]? --     Constitutional: Alert and oriented.  Well appearing and in no acute distress. Eyes: Conjunctivae are normal. PERRL. EOMI. Head: Atraumatic. Nose: No congestion/rhinnorhea. Neck: No stridor.   Cardiovascular: Normal rate, regular rhythm. Grossly normal heart sounds.  Good peripheral circulation. Respiratory: Normal respiratory effort.  No retractions. Lungs CTAB. Gastrointestinal: Soft and nontender. No distention. Bowel sounds normoactive 4 quadrants. Musculoskeletal: Moves upper and lower extremities without any difficulty. On examination of the back there is no gross deformity noted.  There is generalized tenderness on palpation of the lower lumbar spine and paravertebral muscles bilaterally. There is no active muscle spasm seen. Range of motion is minimally guarded. Straight leg raises were approximate 70 without any pain. Neurologic:  Normal speech and language. No gross focal neurologic deficits are appreciated. No gait instability. Skin:  Skin is warm, dry and intact. No rash noted. Psychiatric: Mood and affect are normal. Speech and behavior are normal.  ____________________________________________   LABS (all labs ordered are listed, but only abnormal results are displayed)  Labs Reviewed - No data to display   PROCEDURES  Procedure(s) performed: None  Procedures  Critical Care performed: No  ____________________________________________   INITIAL IMPRESSION / ASSESSMENT AND PLAN / ED COURSE  Pertinent labs & imaging results that were available during my care of the patient were reviewed by me and considered in my medical decision making (see chart for details).    Clinical Course   Patient was given tramadol by mouth while in the emergency room and discharged to wait in the lobby for her grandson. She is encouraged to get the prescription filled and to follow-up as she was directed this morning.  ____________________________________________   FINAL CLINICAL IMPRESSION(S) / ED DIAGNOSES  Final  diagnoses:  Chronic bilateral low back pain without sciatica      NEW MEDICATIONS STARTED DURING THIS VISIT:  Discharge Medication List as of 09/12/2016  8:56 AM       Note:  This document was prepared using Dragon voice recognition software and may include unintentional dictation errors.    Johnn Hai, PA-C 09/12/16 [place], MD 09/12/16 [phone]

## 2016-09-12 NOTE — ED Triage Notes (Signed)
Pt reports that she was just seen in the ER a couple hours ago and given an injection but the pain is returning, pt states that she started neurontin yesterday and it made her legs feel better so she did a lot of housework, pt states that her back and legs are hurting worse now

## 2016-09-12 NOTE — ED Provider Notes (Signed)
[facility] Emergency Department Provider Note  Time seen: 4:19 AM  I have reviewed the triage vital signs and the nursing notes.   HISTORY  Chief Complaint Back Pain    HPI Traci Duran is a 60 y.o. female with a past medical history of COPD, depression, hypertension, hyperlipidemia who presents the emergency department with lower back pain. According to the patient for the past 2 days she's been experiencing significant lower back pain, worse with movement. Patient states is mostly on the left side. She also states lower leg heaviness. Patient states a history of lower leg heaviness in the past, she was treated with Lyrica initially, had just been transitioned to gabapentin. Patient states approximately one week ago she pulled her back out while attempting to open a window, states the pain is improving, but over the past 2 days it has worsened. Denies any abdominal pain, nausea, vomiting. Denies any dysuria.  Past Medical History:  Diagnosis Date  . Anxiety   . COPD (chronic obstructive pulmonary disease) ([place])   . Coronary artery disease   . Depression   . Hyperlipidemia   . Hypertension   . PAD (peripheral artery disease) [facility])     Patient Active Problem List   Diagnosis Date Noted  . Greater tuberosity of humerus fracture 10/24/2013  . Unstable angina ([place]) 10/14/2011  . Hyperlipidemia   . COPD (chronic obstructive pulmonary disease) ([place])   . Depression   . PAD (peripheral artery disease) ([place])   . Coronary artery disease     Past Surgical History:  Procedure Laterality Date  . APPENDECTOMY    . HIP FRACTURE SURGERY      Prior to Admission medications   Medication Sig Start Date End Date Taking? Authorizing Provider  albuterol (VENTOLIN HFA) 108 (90 BASE) MCG/ACT inhaler Inhale 2 puffs into the lungs every 6 (six) hours as needed. For shortness of breath     Historical Provider, MD  ALPRAZolam Duanne Moron) 1 MG tablet Take 1 mg by mouth 4  (four) times daily as needed. For anxiety    Historical Provider, MD  aspirin EC 81 MG tablet Take 81 mg by mouth daily.      Historical Provider, MD  atorvastatin (LIPITOR) 80 MG tablet Take 80 mg by mouth daily.    Historical Provider, MD  busPIRone (BUSPAR) 10 MG tablet Take 10 mg by mouth 2 (two) times daily.    Historical Provider, MD  carvedilol (COREG) 12.5 MG tablet Take 12.5 mg by mouth 2 (two) times daily.      Historical Provider, MD  citalopram (CELEXA) 40 MG tablet Take 40 mg by mouth daily.    Historical Provider, MD  clopidogrel (PLAVIX) 75 MG tablet Take 75 mg by mouth daily.    Historical Provider, MD  HYDROcodone-acetaminophen (NORCO/VICODIN) 5-325 MG per tablet Take 1 tablet by mouth every 6 (six) hours as needed for moderate pain. 11/21/13   Carole Civil, MD  lisinopril (PRINIVIL,ZESTRIL) 20 MG tablet Take 20 mg by mouth daily.      Historical Provider, MD  oxyCODONE (ROXICODONE) 5 MG immediate release tablet Take 1 tablet (5 mg total) by mouth every 8 (eight) hours as needed. 03/10/16 03/10/17  Victorino Dike, FNP  oxyCODONE-acetaminophen (PERCOCET/ROXICET) 5-325 MG per tablet Take 1 tablet by mouth every 4 (four) hours as needed for severe pain. 11/15/13   Carole Civil, MD  pantoprazole (PROTONIX) 40 MG tablet Take 40 mg by mouth 2 (two) times daily.  Historical Provider, MD  potassium chloride (K-DUR) 10 MEQ tablet Take 2 tablets (20 mEq total) by mouth daily. 03/10/16   Victorino Dike, FNP  pravastatin (PRAVACHOL) 40 MG tablet Take 40 mg by mouth at bedtime.    Historical Provider, MD  pregabalin (LYRICA) 300 MG capsule Take 300 mg by mouth 2 (two) times daily.    Historical Provider, MD  traMADol (ULTRAM) 50 MG tablet Take 1 tablet (50 mg total) by mouth once. 11/28/14   Junius Creamer, NP  traZODone (DESYREL) 100 MG tablet Take 100 mg by mouth at bedtime.    Historical Provider, MD    Allergies  Allergen Reactions  . Morphine And Related Itching  . Benadryl  [Diphenhydramine Hcl] Rash  . Chantix [Varenicline] Palpitations    Family History  Problem Relation Age of Onset  . Heart failure Mother   . Heart attack Mother     mother died at 46 of an MI  . Heart attack Father     father died at 31 of an MI  . Heart attack Brother     Brother had an MI in his 32s    Social History Social History  Substance Use Topics  . Smoking status: Current Some Day Smoker    Packs/day: 0.25    Years: 20.00    Types: Cigarettes  . Smokeless tobacco: Never Used  . Alcohol use No     Comment: occasional    Review of Systems Constitutional: Negative for fever. Cardiovascular: Negative for chest pain. Respiratory: Negative for shortness of breath. Gastrointestinal: Negative for abdominal pain Musculoskeletal:Lower back pain Neurological: Negative for headache 10-point ROS otherwise negative.  ____________________________________________   PHYSICAL EXAM:  VITAL SIGNS: ED Triage Vitals  Enc Vitals Group     BP 09/12/16 0408 120/69     Pulse Rate 09/12/16 0408 86     Resp 09/12/16 0408 20     Temp 09/12/16 0408 98.1 F (36.7 C)     Temp Source 09/12/16 0408 Oral     SpO2 09/12/16 0405 96 %     Weight 09/12/16 0409 133 lb (60.3 kg)     Height 09/12/16 0409 5\' 7"  (1.702 m)     Head Circumference --      Peak Flow --      Pain Score --      Pain Loc --      Pain Edu? --      Excl. in [place]? --     Constitutional: Alert and oriented. Well appearing and in no distress. Eyes: Normal exam ENT   Head: Normocephalic and atraumatic   Mouth/Throat: Mucous membranes are moist. Cardiovascular: Normal rate, regular rhythm. No murmur Respiratory: Normal respiratory effort without tachypnea nor retractions. Breath sounds are clear  Gastrointestinal: Soft and nontender. No distention.   Musculoskeletal mild midline lumbar tenderness to palpation moderate left paraspinal tenderness palpation. Neurologic:  Normal speech and language. No gross  focal neurologic deficits Skin:  Skin is warm, dry and intact.  Psychiatric: Mood and affect are normal.   ____________________________________________   INITIAL IMPRESSION / ASSESSMENT AND PLAN / ED COURSE  Pertinent labs & imaging results that were available during my care of the patient were reviewed by me and considered in my medical decision making (see chart for details).  Patient presents the emergency department with lower back pain, worse with movement. Pain is reproducible on exam, highly suspect muscle skeletal pain. We will check a urinalysis as precaution. We will treat with  pain medication in the emergency department. Urinalysis is within normal limits, I plan to discharge home with a short course of Ultram. I discussed the patient she needs to follow up with her primary care doctor tomorrow. Patient is agreeable.  Urinalysis negative. We'll discharge with a short course of Ultram and PCP follow-up.  ____________________________________________   FINAL CLINICAL IMPRESSION(S) / ED DIAGNOSES  Lower back pain    Harvest Dark, MD 09/12/16 0501

## 2016-09-12 NOTE — Discharge Instructions (Signed)
Get your prescription filled. Follow-up with your primary care doctor. You may also call make an appointment with Dr. Rudene Christians who is the orthopedist on call today if any continued problems or concerns with your back.

## 2016-09-12 NOTE — ED Triage Notes (Signed)
Per EMS, patient called due to increasing back pain due to "housework" she did yesterday.  She pulled her back 2 weeks ago and did not seek medical tx at that time.  She has a hx of CAD, PAD, HprTN, COPD, and Anxiety.  She was recently switched to Gabapentin from Lyrica and she feels she "overdid it" at home today and is reporting pain in her lumbar region radiating down both legs.

## 2016-09-12 NOTE — ED Notes (Addendum)
Pt ambulating though out the lobby without difficulty, using the courtesy phone. Pt states that she is waiting for her grandson to pick her up and her pain returned, pt wanting to know how much longer it would be, that if he got here before she was called back she would just get him to take her to the pharmacy with the prescription she was discharged with a couple hours ago

## 2016-09-30 DIAGNOSIS — I5189 Other ill-defined heart diseases: Secondary | ICD-10-CM | POA: Insufficient documentation

## 2017-01-16 ENCOUNTER — Emergency Department
Admission: EM | Admit: 2017-01-16 | Discharge: 2017-01-17 | Disposition: A | Discharge status: Home / Self Care | Payer: Medicare Other | Attending: Emergency Medicine | Admitting: Emergency Medicine

## 2017-01-16 ENCOUNTER — Encounter: Payer: Self-pay | Admitting: *Deleted

## 2017-01-16 ENCOUNTER — Emergency Department: Payer: Medicare Other

## 2017-01-16 DIAGNOSIS — I25110 Atherosclerotic heart disease of native coronary artery with unstable angina pectoris: Secondary | ICD-10-CM | POA: Insufficient documentation

## 2017-01-16 DIAGNOSIS — F17210 Nicotine dependence, cigarettes, uncomplicated: Secondary | ICD-10-CM | POA: Diagnosis not present

## 2017-01-16 DIAGNOSIS — Z79899 Other long term (current) drug therapy: Secondary | ICD-10-CM | POA: Insufficient documentation

## 2017-01-16 DIAGNOSIS — I10 Essential (primary) hypertension: Secondary | ICD-10-CM | POA: Diagnosis not present

## 2017-01-16 DIAGNOSIS — J42 Unspecified chronic bronchitis: Secondary | ICD-10-CM | POA: Diagnosis not present

## 2017-01-16 DIAGNOSIS — Z7982 Long term (current) use of aspirin: Secondary | ICD-10-CM | POA: Diagnosis not present

## 2017-01-16 DIAGNOSIS — R0789 Other chest pain: Secondary | ICD-10-CM | POA: Diagnosis present

## 2017-01-16 DIAGNOSIS — F419 Anxiety disorder, unspecified: Secondary | ICD-10-CM

## 2017-01-16 LAB — COMPREHENSIVE METABOLIC PANEL
ALBUMIN: 3.7 g/dL (ref 3.5–5.0)
ALT: 35 U/L (ref 14–54)
AST: 35 U/L (ref 15–41)
Alkaline Phosphatase: 105 U/L (ref 38–126)
Anion gap: 7 (ref 5–15)
BILIRUBIN TOTAL: 0.3 mg/dL (ref 0.3–1.2)
BUN: 9 mg/dL (ref 6–20)
CO2: 24 mmol/L (ref 22–32)
CREATININE: 0.76 mg/dL (ref 0.44–1.00)
Calcium: 8.7 mg/dL — ABNORMAL LOW (ref 8.9–10.3)
Chloride: 106 mmol/L (ref 101–111)
GFR calc Af Amer: >60 mL/min (ref >60)
Glucose, Bld: 65 mg/dL (ref 65–99)
Potassium: 3.2 mmol/L — ABNORMAL LOW (ref 3.5–5.1)
Sodium: 137 mmol/L (ref 135–145)
TOTAL PROTEIN: 6.8 g/dL (ref 6.5–8.1)

## 2017-01-16 LAB — CBC
HEMATOCRIT: 26.5 % — AB (ref 35.0–47.0)
Hemoglobin: 8.3 g/dL — ABNORMAL LOW (ref 12.0–16.0)
MCH: 21.5 pg — AB (ref 26.0–34.0)
MCHC: 31.3 g/dL — ABNORMAL LOW (ref 32.0–36.0)
MCV: 68.7 fL — AB (ref 80.0–100.0)
Platelets: 280 10*3/uL (ref 150–440)
RBC: 3.86 MIL/uL (ref 3.80–5.20)
RDW: 18.1 % — ABNORMAL HIGH (ref 11.5–14.5)
WBC: 12.8 10*3/uL — AB (ref 3.6–11.0)

## 2017-01-16 LAB — TROPONIN I: Troponin I: <0.03 ng/mL (ref <0.03)

## 2017-01-16 NOTE — ED Triage Notes (Signed)
Pt with onset of chest pain last pm. Pt with noted htn. Pt states "i feel like my heartbeat if flying". Pt denies shob, nausea, diaphoresis. Pt states "maybe I was a little dizzy"

## 2017-01-16 NOTE — ED Notes (Signed)
Pt presents w/ c/o chest pressure and HTN. Pt is hypertensive during triage. Pt had recent loss of her daughter, x 2 months ago, to a seizure. Pt c/o increased stress and anxiety, decreased appetite and ability to sleep. Pt is pale. Pt A&Ox4. Pt mentioning she wants pain meds that will help her get back her appetite. Pt states she has only eaten peanuts and some chicken in past 2 weeks.

## 2017-01-16 NOTE — ED Notes (Signed)
Pt saw her PCP this morning, did not discuss chest pain that started LAST NIGHT. Pt states she has taken all of her home meds including new ones rx'd this morning for her BP which was elevated at [facility] office. PT has developed multiple c/o pain in chest, legs, and epigastrum at this time. Pt in not objective acute distress and is not short of breath at this time. No vomiting/nausea/diarrhea at this time. Pt repeats frequently that she is under a lot of stress and repeatedly denies SI/HI/self harm at this time. Pt has asked for pain meds several times and once specifically for dilaudid.

## 2017-01-17 DIAGNOSIS — J42 Unspecified chronic bronchitis: Secondary | ICD-10-CM | POA: Diagnosis not present

## 2017-01-17 MED ORDER — LORAZEPAM 2 MG/ML IJ SOLN
1.0000 mg | Freq: Once | INTRAMUSCULAR | Status: AC
  Administered 2017-01-17: 0.5 mg via INTRAVENOUS
  Filled 2017-01-17: qty 1

## 2017-01-17 NOTE — ED Notes (Addendum)
ED Provider at bedside discussing risks of leaving and took patient blood pressure himself.

## 2017-01-17 NOTE — ED Provider Notes (Signed)
[facility] Emergency Department Provider Note    First MD Initiated Contact with Patient 01/16/17 2311     (approximate)  I have reviewed the triage vital signs and the nursing notes.   HISTORY  Chief Complaint Hypertension and Chest Pain    HPI Traci Duran is a 61 y.o. female with below list of chronic medical conditions presents to the emergency department central chest discomfort with onset last night. Patient states her current pain score 7 out of 10. Patient believes that her chest discomfort is secondary to "anxiety and stress". Patient denies any shortness of breath no nausea vomiting or diaphoresis. Patient denies any dizziness weakness numbness or gait instability. Patient requesting Dilaudid for pain and  Ativan for anxiety at this time.   Past Medical History:  Diagnosis Date  . Anxiety   . COPD (chronic obstructive pulmonary disease) ([place])   . Coronary artery disease   . Depression   . Hyperlipidemia   . Hypertension   . PAD (peripheral artery disease) [facility])     Patient Active Problem List   Diagnosis Date Noted  . Greater tuberosity of humerus fracture 10/24/2013  . Unstable angina ([place]) 10/14/2011  . Hyperlipidemia   . COPD (chronic obstructive pulmonary disease) ([place])   . Depression   . PAD (peripheral artery disease) ([place])   . Coronary artery disease     Past Surgical History:  Procedure Laterality Date  . APPENDECTOMY    . HIP FRACTURE SURGERY      Prior to Admission medications   Medication Sig Start Date End Date Taking? Authorizing Provider  albuterol (VENTOLIN HFA) 108 (90 BASE) MCG/ACT inhaler Inhale 2 puffs into the lungs every 6 (six) hours as needed. For shortness of breath     Historical Provider, MD  ALPRAZolam Duanne Moron) 1 MG tablet Take 1 mg by mouth 4 (four) times daily as needed. For anxiety    Historical Provider, MD  aspirin EC 81 MG tablet Take 81 mg by mouth daily.      Historical Provider, MD    atorvastatin (LIPITOR) 80 MG tablet Take 80 mg by mouth daily.    Historical Provider, MD  busPIRone (BUSPAR) 10 MG tablet Take 10 mg by mouth 2 (two) times daily.    Historical Provider, MD  carvedilol (COREG) 12.5 MG tablet Take 12.5 mg by mouth 2 (two) times daily.      Historical Provider, MD  citalopram (CELEXA) 40 MG tablet Take 40 mg by mouth daily.    Historical Provider, MD  clopidogrel (PLAVIX) 75 MG tablet Take 75 mg by mouth daily.    Historical Provider, MD  HYDROcodone-acetaminophen (NORCO/VICODIN) 5-325 MG per tablet Take 1 tablet by mouth every 6 (six) hours as needed for moderate pain. 11/21/13   Carole Civil, MD  lisinopril (PRINIVIL,ZESTRIL) 20 MG tablet Take 20 mg by mouth daily.      Historical Provider, MD  oxyCODONE (ROXICODONE) 5 MG immediate release tablet Take 1 tablet (5 mg total) by mouth every 8 (eight) hours as needed. 03/10/16 03/10/17  Victorino Dike, FNP  oxyCODONE-acetaminophen (PERCOCET/ROXICET) 5-325 MG per tablet Take 1 tablet by mouth every 4 (four) hours as needed for severe pain. 11/15/13   Carole Civil, MD  pantoprazole (PROTONIX) 40 MG tablet Take 40 mg by mouth 2 (two) times daily.    Historical Provider, MD  potassium chloride (K-DUR) 10 MEQ tablet Take 2 tablets (20 mEq total) by mouth daily. 03/10/16   Cari  B Triplett, FNP  pravastatin (PRAVACHOL) 40 MG tablet Take 40 mg by mouth at bedtime.    Historical Provider, MD  pregabalin (LYRICA) 300 MG capsule Take 300 mg by mouth 2 (two) times daily.    Historical Provider, MD  traMADol (ULTRAM) 50 MG tablet Take 1 tablet (50 mg total) by mouth every 6 (six) hours as needed. 09/12/16   Harvest Dark, MD  traZODone (DESYREL) 100 MG tablet Take 100 mg by mouth at bedtime.    Historical Provider, MD    Allergies Morphine and related; Benadryl [diphenhydramine hcl]; and Chantix [varenicline]  Family History  Problem Relation Age of Onset  . Heart failure Mother   . Heart attack Mother     mother  died at 60 of an MI  . Heart attack Father     father died at 19 of an MI  . Heart attack Brother     Brother had an MI in his 43s    Social History Social History  Substance Use Topics  . Smoking status: Current Some Day Smoker    Packs/day: 0.25    Years: 20.00    Types: Cigarettes  . Smokeless tobacco: Never Used  . Alcohol use No     Comment: occasional    Review of Systems Constitutional: No fever/chills Eyes: No visual changes. ENT: No sore throat. Cardiovascular: Denies chest pain. Respiratory: Denies shortness of breath. Gastrointestinal: No abdominal pain.  No nausea, no vomiting.  No diarrhea.  No constipation. Genitourinary: Negative for dysuria. Musculoskeletal: Negative for back pain. Skin: Negative for rash. Neurological: Negative for headaches, focal weakness or numbness.  10-point ROS otherwise negative.  ____________________________________________   PHYSICAL EXAM:  VITAL SIGNS: ED Triage Vitals  Enc Vitals Group     BP 01/16/17 2237 (!) 186/87     Pulse Rate 01/16/17 2239 75     Resp 01/16/17 2237 16     Temp --      Temp Source 01/16/17 2237 Oral     SpO2 01/16/17 2240 100 %     Weight 01/16/17 2237 133 lb (60.3 kg)     Height 01/16/17 2237 5\' 7"  (1.702 m)     Head Circumference --      Peak Flow --      Pain Score 01/16/17 2237 7     Pain Loc --      Pain Edu? --      Excl. in [place]? --     Constitutional: Alert and oriented. Well appearing and in no acute distress. Eyes: Conjunctivae are normal. PERRL. EOMI. Head: Atraumatic. Mouth/Throat: Mucous membranes are moist. Neck: No stridor.   Cardiovascular: Normal rate, regular rhythm. Good peripheral circulation. Grossly normal heart sounds. Respiratory: Normal respiratory effort.  No retractions. Lungs CTAB. Gastrointestinal: Soft and nontender. No distention.  Musculoskeletal: No lower extremity tenderness nor edema. No gross deformities of extremities. Neurologic:  Normal speech and  language. No gross focal neurologic deficits are appreciated.  Skin:  Skin is warm, dry and intact. No rash noted. Psychiatric: Depressed mood. Speech and behavior are normal.  ____________________________________________   LABS (all labs ordered are listed, but only abnormal results are displayed)  Labs Reviewed  CBC - Abnormal; Notable for the following:       Result Value   WBC 12.8 (*)    Hemoglobin 8.3 (*)    HCT 26.5 (*)    MCV 68.7 (*)    MCH 21.5 (*)    MCHC 31.3 (*)  RDW 18.1 (*)    All other components within normal limits  COMPREHENSIVE METABOLIC PANEL - Abnormal; Notable for the following:    Potassium 3.2 (*)    Calcium 8.7 (*)    All other components within normal limits  TROPONIN I  TROPONIN I   ____________________________________________  EKG  ED ECG REPORT I, [place] N BROWN, the attending physician, personally viewed and interpreted this ECG.   Date: 01/21/2017  EKG Time: 11:05 PM  Rate: 73  Rhythm: Normal sinus rhythm  Axis: Normal  Intervals: Normal  ST&T Change: None  ____________________________________________  RADIOLOGY I, [place] N BROWN, personally viewed and evaluated these images (plain radiographs) as part of my medical decision making, as well as reviewing the written report by the radiologist.  Dg Chest 2 View  Result Date: 01/16/2017 CLINICAL DATA:  Chest pain since last evening EXAM: CHEST  2 VIEW COMPARISON:  03/09/2016 CXR FINDINGS: The heart size and mediastinal contours are within normal limits. Aortic atherosclerosis is noted without aneurysm. Mild interstitial prominence which may reflect chronic bronchitic change. No alveolar consolidation, effusion or pneumothorax. The visualized skeletal structures are unremarkable. IMPRESSION: Mild increase in interstitial prominence which may reflect chronic bronchitic change. No alveolar consolidation to suggest pneumonia. No pneumothorax. Aortic atherosclerosis. Electronically Signed    By: Ashley Royalty M.D.   On: 01/16/2017 23:27     Procedures   INITIAL IMPRESSION / ASSESSMENT AND PLAN / ED COURSE  Pertinent labs & imaging results that were available during my care of the patient were reviewed by me and considered in my medical decision making (see chart for details).  After receiving IV Ativan patient states that she feels better and requesting to be discharged from the emergency department patient denied any pain at this time. I spoke to the patient at length regarding the fact that her medical evaluation workup is not complete however patient is adamantly requesting discharge at this time stating that she will leave AMA.      ____________________________________________  FINAL CLINICAL IMPRESSION(S) / ED DIAGNOSES  Final diagnoses:  Anxiety  Chronic bronchitis, unspecified chronic bronchitis type ([place])     MEDICATIONS GIVEN DURING THIS VISIT:  Medications  LORazepam (ATIVAN) injection 1 mg (not administered)     NEW OUTPATIENT MEDICATIONS STARTED DURING THIS VISIT:  New Prescriptions   No medications on file    Modified Medications   No medications on file    Discontinued Medications   No medications on file     Note:  This document was prepared using Dragon voice recognition software and may include unintentional dictation errors.    Gregor Hams, MD 01/21/17 [phone]

## 2017-01-17 NOTE — ED Notes (Signed)
Patient expressing remorse for the way she treated staff and said she was sorry.  She was very complimentary of Dr. Owens Shark as she left.

## 2017-01-17 NOTE — ED Notes (Signed)
ED Provider at bedside. 

## 2017-01-17 NOTE — ED Notes (Signed)
Patient saying she wish she had not come here, and asking to go home.  MD informed.

## 2017-04-06 ENCOUNTER — Emergency Department: Payer: Medicare Other

## 2017-04-06 ENCOUNTER — Inpatient Hospital Stay
Admission: EM | Admit: 2017-04-06 | Discharge: 2017-04-09 | DRG: 377 | Disposition: A | Discharge status: Home / Self Care | Payer: Medicare Other | Attending: Internal Medicine | Admitting: Internal Medicine

## 2017-04-06 ENCOUNTER — Encounter: Payer: Self-pay | Admitting: Emergency Medicine

## 2017-04-06 DIAGNOSIS — K922 Gastrointestinal hemorrhage, unspecified: Secondary | ICD-10-CM | POA: Diagnosis present

## 2017-04-06 DIAGNOSIS — Z7902 Long term (current) use of antithrombotics/antiplatelets: Secondary | ICD-10-CM | POA: Diagnosis not present

## 2017-04-06 DIAGNOSIS — K921 Melena: Secondary | ICD-10-CM | POA: Diagnosis present

## 2017-04-06 DIAGNOSIS — K2971 Gastritis, unspecified, with bleeding: Principal | ICD-10-CM | POA: Diagnosis present

## 2017-04-06 DIAGNOSIS — I110 Hypertensive heart disease with heart failure: Secondary | ICD-10-CM | POA: Diagnosis present

## 2017-04-06 DIAGNOSIS — D62 Acute posthemorrhagic anemia: Secondary | ICD-10-CM | POA: Diagnosis not present

## 2017-04-06 DIAGNOSIS — F329 Major depressive disorder, single episode, unspecified: Secondary | ICD-10-CM | POA: Diagnosis present

## 2017-04-06 DIAGNOSIS — D500 Iron deficiency anemia secondary to blood loss (chronic): Secondary | ICD-10-CM | POA: Diagnosis present

## 2017-04-06 DIAGNOSIS — Z7982 Long term (current) use of aspirin: Secondary | ICD-10-CM | POA: Diagnosis not present

## 2017-04-06 DIAGNOSIS — I5033 Acute on chronic diastolic (congestive) heart failure: Secondary | ICD-10-CM | POA: Diagnosis present

## 2017-04-06 DIAGNOSIS — Z8249 Family history of ischemic heart disease and other diseases of the circulatory system: Secondary | ICD-10-CM | POA: Diagnosis not present

## 2017-04-06 DIAGNOSIS — J810 Acute pulmonary edema: Secondary | ICD-10-CM

## 2017-04-06 DIAGNOSIS — E1151 Type 2 diabetes mellitus with diabetic peripheral angiopathy without gangrene: Secondary | ICD-10-CM | POA: Diagnosis present

## 2017-04-06 DIAGNOSIS — I2510 Atherosclerotic heart disease of native coronary artery without angina pectoris: Secondary | ICD-10-CM | POA: Diagnosis present

## 2017-04-06 DIAGNOSIS — F17210 Nicotine dependence, cigarettes, uncomplicated: Secondary | ICD-10-CM | POA: Diagnosis present

## 2017-04-06 DIAGNOSIS — Z79899 Other long term (current) drug therapy: Secondary | ICD-10-CM

## 2017-04-06 DIAGNOSIS — Z955 Presence of coronary angioplasty implant and graft: Secondary | ICD-10-CM | POA: Diagnosis not present

## 2017-04-06 DIAGNOSIS — F419 Anxiety disorder, unspecified: Secondary | ICD-10-CM | POA: Diagnosis present

## 2017-04-06 DIAGNOSIS — E119 Type 2 diabetes mellitus without complications: Secondary | ICD-10-CM | POA: Diagnosis present

## 2017-04-06 DIAGNOSIS — J9621 Acute and chronic respiratory failure with hypoxia: Secondary | ICD-10-CM | POA: Diagnosis present

## 2017-04-06 DIAGNOSIS — J449 Chronic obstructive pulmonary disease, unspecified: Secondary | ICD-10-CM | POA: Diagnosis present

## 2017-04-06 DIAGNOSIS — E876 Hypokalemia: Secondary | ICD-10-CM | POA: Diagnosis present

## 2017-04-06 DIAGNOSIS — I5032 Chronic diastolic (congestive) heart failure: Secondary | ICD-10-CM | POA: Diagnosis present

## 2017-04-06 DIAGNOSIS — R7989 Other specified abnormal findings of blood chemistry: Secondary | ICD-10-CM | POA: Diagnosis not present

## 2017-04-06 DIAGNOSIS — E785 Hyperlipidemia, unspecified: Secondary | ICD-10-CM | POA: Diagnosis present

## 2017-04-06 LAB — CBC WITH DIFFERENTIAL/PLATELET
BASOS ABS: 0.1 10*3/uL (ref 0–0.1)
Basophils Relative: 1 %
Eosinophils Absolute: 0.3 10*3/uL (ref 0–0.7)
Eosinophils Relative: 2 %
HEMATOCRIT: 23.6 % — AB (ref 35.0–47.0)
Hemoglobin: 6.6 g/dL — ABNORMAL LOW (ref 12.0–16.0)
LYMPHS PCT: 38 %
Lymphs Abs: 7.6 10*3/uL — ABNORMAL HIGH (ref 1.0–3.6)
MCH: 17.9 pg — ABNORMAL LOW (ref 26.0–34.0)
MCHC: 28.1 g/dL — AB (ref 32.0–36.0)
MCV: 63.5 fL — AB (ref 80.0–100.0)
MONO ABS: 1.2 10*3/uL — AB (ref 0.2–0.9)
Monocytes Relative: 6 %
Neutro Abs: 11 10*3/uL — ABNORMAL HIGH (ref 1.4–6.5)
Neutrophils Relative %: 53 %
Platelets: 398 10*3/uL (ref 150–440)
RBC: 3.72 MIL/uL — ABNORMAL LOW (ref 3.80–5.20)
RDW: 19 % — AB (ref 11.5–14.5)
WBC: 20.2 10*3/uL — ABNORMAL HIGH (ref 3.6–11.0)

## 2017-04-06 LAB — COMPREHENSIVE METABOLIC PANEL
ALT: 31 U/L (ref 14–54)
AST: 45 U/L — AB (ref 15–41)
Albumin: 3.6 g/dL (ref 3.5–5.0)
Alkaline Phosphatase: 152 U/L — ABNORMAL HIGH (ref 38–126)
Anion gap: 14 (ref 5–15)
BUN: 17 mg/dL (ref 6–20)
CALCIUM: 8.6 mg/dL — AB (ref 8.9–10.3)
CO2: 19 mmol/L — AB (ref 22–32)
CREATININE: 0.9 mg/dL (ref 0.44–1.00)
Chloride: 104 mmol/L (ref 101–111)
Glucose, Bld: 285 mg/dL — ABNORMAL HIGH (ref 65–99)
Potassium: 3 mmol/L — ABNORMAL LOW (ref 3.5–5.1)
Sodium: 137 mmol/L (ref 135–145)
Total Bilirubin: 0.3 mg/dL (ref 0.3–1.2)
Total Protein: 6.7 g/dL (ref 6.5–8.1)

## 2017-04-06 LAB — PROTIME-INR
INR: 1.07
Prothrombin Time: 13.9 seconds (ref 11.4–15.2)

## 2017-04-06 LAB — LIPASE, BLOOD: LIPASE: 25 U/L (ref 11–51)

## 2017-04-06 LAB — ABO/RH: ABO/RH(D): O POS

## 2017-04-06 LAB — PREPARE RBC (CROSSMATCH)

## 2017-04-06 LAB — TROPONIN I

## 2017-04-06 LAB — LACTIC ACID, PLASMA: Lactic Acid, Venous: 4.9 mmol/L (ref 0.5–1.9)

## 2017-04-06 LAB — PROCALCITONIN: Procalcitonin: <0.1 ng/mL

## 2017-04-06 MED ORDER — NITROGLYCERIN IN D5W 200-5 MCG/ML-% IV SOLN: 0.0000 ug/min | Freq: Once | INTRAVENOUS | Status: DC

## 2017-04-06 MED ORDER — SODIUM CHLORIDE 0.9 % IV SOLN
80.0000 mg | Freq: Once | INTRAVENOUS | Status: AC
  Administered 2017-04-06: 23:00:00 80 mg via INTRAVENOUS
  Filled 2017-04-06: qty 80

## 2017-04-06 MED ORDER — SODIUM CHLORIDE 0.9 % IV SOLN
10.0000 mL/h | Freq: Once | INTRAVENOUS | Status: AC
  Administered 2017-04-09: 11:00:00 via INTRAVENOUS

## 2017-04-06 MED ORDER — LORAZEPAM 2 MG/ML IJ SOLN
1.0000 mg | Freq: Once | INTRAMUSCULAR | Status: AC
  Administered 2017-04-06: 1 mg via INTRAVENOUS
  Filled 2017-04-06: qty 1

## 2017-04-06 MED ORDER — NITROGLYCERIN IN D5W 200-5 MCG/ML-% IV SOLN
0.0000 ug/min | Freq: Once | INTRAVENOUS | Status: AC
  Administered 2017-04-06: 5 ug/min via INTRAVENOUS

## 2017-04-06 MED ORDER — NITROGLYCERIN IN D5W 200-5 MCG/ML-% IV SOLN
INTRAVENOUS | Status: AC
  Administered 2017-04-06: 5 ug/min via INTRAVENOUS
  Filled 2017-04-06: qty 250

## 2017-04-06 NOTE — ED Provider Notes (Signed)
[facility] Emergency Department Provider Note  ____________________________________________   First MD Initiated Contact with Patient 04/06/17 2057     (approximate)  I have reviewed the triage vital signs and the nursing notes.   HISTORY  Chief Complaint Shortness of Breath    HPI Traci Duran is a 61 y.o. female who comes to the emergency department with 1 hour of sudden onset shortness of breath. She has a past medical history of congestive heart failure and COPD. History is difficult to obtain on arrival as the patient is gasping for air and is critically ill   Past Medical History:  Diagnosis Date  . Anxiety   . COPD (chronic obstructive pulmonary disease) ([place])   . Coronary artery disease   . Depression   . Hyperlipidemia   . Hypertension   . PAD (peripheral artery disease) [facility])     Patient Active Problem List   Diagnosis Date Noted  . GI bleed 04/06/2017  . Greater tuberosity of humerus fracture 10/24/2013  . Unstable angina ([place]) 10/14/2011  . Hyperlipidemia   . COPD (chronic obstructive pulmonary disease) ([place])   . Depression   . PAD (peripheral artery disease) ([place])   . Coronary artery disease     Past Surgical History:  Procedure Laterality Date  . APPENDECTOMY    . HIP FRACTURE SURGERY      Prior to Admission medications   Medication Sig Start Date End Date Taking? Authorizing Provider  albuterol (VENTOLIN HFA) 108 (90 BASE) MCG/ACT inhaler Inhale 2 puffs into the lungs every 6 (six) hours as needed. For shortness of breath    Yes Historical Provider, MD  ALPRAZolam Duanne Moron) 1 MG tablet Take 1 mg by mouth 3 (three) times daily as needed. For anxiety    Yes Historical Provider, MD  aspirin EC 81 MG tablet Take 81 mg by mouth daily.     Yes Historical Provider, MD  atorvastatin (LIPITOR) 80 MG tablet Take 80 mg by mouth daily.   Yes Historical Provider, MD  carvedilol (COREG) 12.5 MG tablet Take 12.5 mg by mouth 2 (two)  times daily.     Yes Historical Provider, MD  citalopram (CELEXA) 40 MG tablet Take 40 mg by mouth daily.   Yes Historical Provider, MD  clopidogrel (PLAVIX) 75 MG tablet Take 75 mg by mouth daily.   Yes Historical Provider, MD  DULoxetine (CYMBALTA) 30 MG capsule Take 1 capsule by mouth daily. 04/06/17  Yes Historical Provider, MD  FLOVENT HFA 110 MCG/ACT inhaler Inhale 1 puff into the lungs 2 (two) times daily.  04/06/17  Yes Historical Provider, MD  gabapentin (NEURONTIN) 300 MG capsule Take 1 capsule by mouth 3 (three) times daily. 04/06/17  Yes Historical Provider, MD  lisinopril (PRINIVIL,ZESTRIL) 20 MG tablet Take 20 mg by mouth daily.     Yes Historical Provider, MD  pantoprazole (PROTONIX) 20 MG tablet Take 1 tablet by mouth daily. 04/06/17  Yes Historical Provider, MD    Allergies Gabapentin; Morphine and related; Benadryl [diphenhydramine hcl]; Chantix [varenicline]; Niacin; and Trazodone  Family History  Problem Relation Age of Onset  . Heart failure Mother   . Heart attack Mother     mother died at 27 of an MI  . Heart attack Father     father died at 43 of an MI  . Heart attack Brother     Brother had an MI in his 51s    Social History Social History  Substance Use Topics  .  Smoking status: Current Some Day Smoker    Packs/day: 0.25    Years: 20.00    Types: Cigarettes  . Smokeless tobacco: Never Used  . Alcohol use No     Comment: occasional    Review of Systems Level V exemption history Limited by the patient's clinical condition 10-point ROS otherwise negative.  ____________________________________________   PHYSICAL EXAM:  VITAL SIGNS: ED Triage Vitals  Enc Vitals Group     BP      Pulse      Resp      Temp      Temp src      SpO2      Weight      Height      Head Circumference      Peak Flow      Pain Score      Pain Loc      Pain Edu?      Excl. in [place]?     Constitutional: Severe respiratory distress gasping for air diaphoretic and  extremely ill appearing Eyes: PERRL EOMI. Head: Atraumatic. Nose: No congestion/rhinnorhea. Mouth/Throat: No trismus Neck: No stridor.   Cardiovascular: Tachycardic rate, regular rhythm. Grossly normal heart sounds.   Respiratory: Rhonchi throughout lower bases but moving limited amounts of air no wheezing Gastrointestinal: Soft nontender Rectal exam performed with female chaperone: Guaiac-positive control positive melena Musculoskeletal: No lower extremity edema   Neurologic:  Normal speech and language. No gross focal neurologic deficits are appreciated. Skin: Pale and diaphoretic Psychiatric: Mood and affect are normal. Speech and behavior are normal.    ____________________________________________   DIFFERENTIAL  Congestive heart failure, COPD, pulmonary embolism, GI bleed, lower GI ____________________________________________   LABS (all labs ordered are listed, but only abnormal results are displayed)  Labs Reviewed  LACTIC ACID, PLASMA - Abnormal; Notable for the following:       Result Value   Lactic Acid, Venous 4.9 (*)    All other components within normal limits  COMPREHENSIVE METABOLIC PANEL - Abnormal; Notable for the following:    Potassium 3.0 (*)    CO2 19 (*)    Glucose, Bld 285 (*)    Calcium 8.6 (*)    AST 45 (*)    Alkaline Phosphatase 152 (*)    All other components within normal limits  CBC WITH DIFFERENTIAL/PLATELET - Abnormal; Notable for the following:    WBC 20.2 (*)    RBC 3.72 (*)    Hemoglobin 6.6 (*)    HCT 23.6 (*)    MCV 63.5 (*)    MCH 17.9 (*)    MCHC 28.1 (*)    RDW 19.0 (*)    Neutro Abs 11.0 (*)    Lymphs Abs 7.6 (*)    Monocytes Absolute 1.2 (*)    All other components within normal limits  CULTURE, BLOOD (ROUTINE X 2)  CULTURE, BLOOD (ROUTINE X 2)  URINE CULTURE  MRSA PCR SCREENING  LIPASE, BLOOD  TROPONIN I  PROCALCITONIN  PROTIME-INR  LACTIC ACID, PLASMA  URINALYSIS, ROUTINE W REFLEX MICROSCOPIC  HEMOGLOBIN AND  HEMATOCRIT, BLOOD  HEMOGLOBIN AND HEMATOCRIT, BLOOD  HEMOGLOBIN AND HEMATOCRIT, BLOOD  HEMOGLOBIN AND HEMATOCRIT, BLOOD  PREPARE RBC (CROSSMATCH)  TYPE AND SCREEN  ABO/RH    Significant anemia is microcytic consistent with GI bleed __________________________________________  EKG  ED ECG REPORT I, Darel Hong, the attending physician, personally viewed and interpreted this ECG.  Date: 04/07/2017 Rate: 115 Rhythm: Sinus tachycardia QRS Axis: normal Intervals: normal ST/T Wave  abnormalities: Diffuse slight ST depression Conduction Disturbances: none Narrative Interpretation: Abnormal  ____________________________________________  RADIOLOGY  Chest x-ray showing pulmonary edema   PROCEDURES  Procedure(s) performed: no  Procedures  Critical Care performed: yes  CRITICAL CARE Performed by: Darel Hong   Total critical care time: 35 minutes  Critical care time was exclusive of separately billable procedures and treating other patients.  Critical care was necessary to treat or prevent imminent or life-threatening deterioration.  Critical care was time spent personally by me on the following activities: development of treatment plan with patient and/or surrogate as well as nursing, discussions with consultants, evaluation of patient's response to treatment, examination of patient, obtaining history from patient or surrogate, ordering and performing treatments and interventions, ordering and review of laboratory studies, ordering and review of radiographic studies, pulse oximetry and re-evaluation of patient's condition.   Observation: no ____________________________________________   INITIAL IMPRESSION / ASSESSMENT AND PLAN / ED COURSE  Pertinent labs & imaging results that were available during my care of the patient were reviewed by me and considered in my medical decision making (see chart for details).  On arrival the patient is critically ill saturating  low 80s S. She was gasping for air. Differential is broad. History of COPD and CHF, however favor flash pulmonary edema. Had a placed on BiPAP and she nearly immediately improved. Labs are pending.  The patient was profoundly anemic frank melena. She denies a previous history of GI bleed. At this point she requires inpatient admission for respiratory support as well as for blood transfusion. I have begun Protonix. She likely requires ICU admission.      ____________________________________________   FINAL CLINICAL IMPRESSION(S) / ED DIAGNOSES  Final diagnoses:  Flash pulmonary edema ([place])  Melena      NEW MEDICATIONS STARTED DURING THIS VISIT:  New Prescriptions   No medications on file     Note:  This document was prepared using Dragon voice recognition software and may include unintentional dictation errors.     Darel Hong, MD 04/07/17 [phone]

## 2017-04-06 NOTE — ED Triage Notes (Signed)
Pt presents to ED c/o SOB O2 sats 72-79%

## 2017-04-06 NOTE — ED Notes (Signed)
Resumed care from shannin rn.  Pt alert and on cell phone.  bipap in place.  Iv's in place.  nsr on monitor.

## 2017-04-07 DIAGNOSIS — D62 Acute posthemorrhagic anemia: Secondary | ICD-10-CM

## 2017-04-07 DIAGNOSIS — K922 Gastrointestinal hemorrhage, unspecified: Secondary | ICD-10-CM

## 2017-04-07 DIAGNOSIS — R7989 Other specified abnormal findings of blood chemistry: Secondary | ICD-10-CM

## 2017-04-07 LAB — MRSA PCR SCREENING: MRSA BY PCR: NEGATIVE

## 2017-04-07 LAB — CBC
HEMATOCRIT: 27 % — AB (ref 35.0–47.0)
Hemoglobin: 8.2 g/dL — ABNORMAL LOW (ref 12.0–16.0)
MCH: 19.7 pg — ABNORMAL LOW (ref 26.0–34.0)
MCHC: 30.2 g/dL — ABNORMAL LOW (ref 32.0–36.0)
MCV: 65.2 fL — AB (ref 80.0–100.0)
Platelets: 275 10*3/uL (ref 150–440)
RBC: 4.15 MIL/uL (ref 3.80–5.20)
RDW: 23.2 % — AB (ref 11.5–14.5)
WBC: 15.8 10*3/uL — ABNORMAL HIGH (ref 3.6–11.0)

## 2017-04-07 LAB — COMPREHENSIVE METABOLIC PANEL
ALBUMIN: 3.6 g/dL (ref 3.5–5.0)
ALT: 28 U/L (ref 14–54)
ANION GAP: 8 (ref 5–15)
AST: 33 U/L (ref 15–41)
Alkaline Phosphatase: 131 U/L — ABNORMAL HIGH (ref 38–126)
BILIRUBIN TOTAL: 0.5 mg/dL (ref 0.3–1.2)
BUN: 18 mg/dL (ref 6–20)
CHLORIDE: 106 mmol/L (ref 101–111)
CO2: 27 mmol/L (ref 22–32)
Calcium: 8.8 mg/dL — ABNORMAL LOW (ref 8.9–10.3)
Creatinine, Ser: 0.67 mg/dL (ref 0.44–1.00)
GFR calc Af Amer: >60 mL/min (ref >60)
GFR calc non Af Amer: >60 mL/min (ref >60)
Glucose, Bld: 99 mg/dL (ref 65–99)
Potassium: 3.3 mmol/L — ABNORMAL LOW (ref 3.5–5.1)
Sodium: 141 mmol/L (ref 135–145)
Total Protein: 6.7 g/dL (ref 6.5–8.1)

## 2017-04-07 LAB — GLUCOSE, CAPILLARY
GLUCOSE-CAPILLARY: 82 mg/dL (ref 65–99)
GLUCOSE-CAPILLARY: 98 mg/dL (ref 65–99)
Glucose-Capillary: 115 mg/dL — ABNORMAL HIGH (ref 65–99)
Glucose-Capillary: 91 mg/dL (ref 65–99)

## 2017-04-07 LAB — URINALYSIS, ROUTINE W REFLEX MICROSCOPIC
Bilirubin Urine: NEGATIVE
Glucose, UA: NEGATIVE mg/dL
HGB URINE DIPSTICK: NEGATIVE
KETONES UR: NEGATIVE mg/dL
Leukocytes, UA: NEGATIVE
NITRITE: NEGATIVE
Protein, ur: NEGATIVE mg/dL
Specific Gravity, Urine: 1.003 — ABNORMAL LOW (ref 1.005–1.030)
pH: 7 (ref 5.0–8.0)

## 2017-04-07 LAB — TROPONIN I
TROPONIN I: 0.15 ng/mL — AB (ref <0.03)
Troponin I: 0.12 ng/mL (ref <0.03)

## 2017-04-07 LAB — HEMOGLOBIN AND HEMATOCRIT, BLOOD
HCT: 31 % — ABNORMAL LOW (ref 35.0–47.0)
Hemoglobin: 9.5 g/dL — ABNORMAL LOW (ref 12.0–16.0)

## 2017-04-07 LAB — MAGNESIUM
MAGNESIUM: 1.7 mg/dL (ref 1.7–2.4)
Magnesium: 1.8 mg/dL (ref 1.7–2.4)

## 2017-04-07 LAB — POTASSIUM
POTASSIUM: 2.6 mmol/L — AB (ref 3.5–5.1)
POTASSIUM: 3.9 mmol/L (ref 3.5–5.1)

## 2017-04-07 LAB — LACTIC ACID, PLASMA: Lactic Acid, Venous: 0.9 mmol/L (ref 0.5–1.9)

## 2017-04-07 MED ORDER — SODIUM CHLORIDE 0.9 % IV SOLN: 30.0000 meq | Freq: Once | INTRAVENOUS | Status: DC

## 2017-04-07 MED ORDER — VANCOMYCIN HCL IN DEXTROSE 1-5 GM/200ML-% IV SOLN
1000.0000 mg | INTRAVENOUS | Status: DC
  Filled 2017-04-07: qty 200

## 2017-04-07 MED ORDER — POTASSIUM CHLORIDE CRYS ER 20 MEQ PO TBCR
40.0000 meq | EXTENDED_RELEASE_TABLET | Freq: Once | ORAL | Status: AC
  Administered 2017-04-07: 40 meq via ORAL
  Filled 2017-04-07: qty 2

## 2017-04-07 MED ORDER — GABAPENTIN 300 MG PO CAPS
300.0000 mg | ORAL_CAPSULE | Freq: Three times a day (TID) | ORAL | Status: DC
  Administered 2017-04-07 – 2017-04-09 (×7): 300 mg via ORAL
  Filled 2017-04-07 (×7): qty 1

## 2017-04-07 MED ORDER — DULOXETINE HCL 30 MG PO CPEP
30.0000 mg | ORAL_CAPSULE | Freq: Every day | ORAL | Status: DC
  Administered 2017-04-07 – 2017-04-09 (×3): 30 mg via ORAL
  Filled 2017-04-07 (×3): qty 1

## 2017-04-07 MED ORDER — ONDANSETRON HCL 4 MG/2ML IJ SOLN: 4.0000 mg | Freq: Four times a day (QID) | INTRAMUSCULAR | Status: DC | PRN

## 2017-04-07 MED ORDER — FUROSEMIDE 10 MG/ML IJ SOLN
40.0000 mg | Freq: Once | INTRAMUSCULAR | Status: AC
  Administered 2017-04-07: 40 mg via INTRAVENOUS
  Filled 2017-04-07: qty 4

## 2017-04-07 MED ORDER — ACETAMINOPHEN 650 MG RE SUPP: 650.0000 mg | Freq: Four times a day (QID) | RECTAL | Status: DC | PRN

## 2017-04-07 MED ORDER — CHLORHEXIDINE GLUCONATE 0.12 % MT SOLN
15.0000 mL | Freq: Two times a day (BID) | OROMUCOSAL | Status: DC
  Administered 2017-04-07: 15 mL via OROMUCOSAL

## 2017-04-07 MED ORDER — SODIUM CHLORIDE 0.9% FLUSH
3.0000 mL | Freq: Two times a day (BID) | INTRAVENOUS | Status: DC
  Administered 2017-04-07 – 2017-04-09 (×6): 3 mL via INTRAVENOUS

## 2017-04-07 MED ORDER — SODIUM CHLORIDE 0.9 % IV SOLN: 250.0000 mL | INTRAVENOUS | Status: DC | PRN

## 2017-04-07 MED ORDER — HYDRALAZINE HCL 20 MG/ML IJ SOLN
10.0000 mg | Freq: Four times a day (QID) | INTRAMUSCULAR | Status: DC | PRN
  Administered 2017-04-07: 20 mg via INTRAVENOUS
  Filled 2017-04-07: qty 1

## 2017-04-07 MED ORDER — SODIUM CHLORIDE 0.9% FLUSH: 3.0000 mL | INTRAVENOUS | Status: DC | PRN

## 2017-04-07 MED ORDER — TRAMADOL HCL 50 MG PO TABS
50.0000 mg | ORAL_TABLET | Freq: Four times a day (QID) | ORAL | Status: DC | PRN
  Administered 2017-04-07 – 2017-04-08 (×2): 50 mg via ORAL
  Filled 2017-04-07 (×2): qty 1

## 2017-04-07 MED ORDER — PIPERACILLIN-TAZOBACTAM 3.375 G IVPB
3.3750 g | Freq: Three times a day (TID) | INTRAVENOUS | Status: DC
  Administered 2017-04-07 (×2): 3.375 g via INTRAVENOUS
  Filled 2017-04-07 (×4): qty 50

## 2017-04-07 MED ORDER — ATORVASTATIN CALCIUM 20 MG PO TABS
80.0000 mg | ORAL_TABLET | Freq: Every day | ORAL | Status: DC
Start: 2017-04-07 — End: 2017-04-09
  Administered 2017-04-07 – 2017-04-09 (×3): 80 mg via ORAL
  Filled 2017-04-07 (×3): qty 4

## 2017-04-07 MED ORDER — CITALOPRAM HYDROBROMIDE 20 MG PO TABS
40.0000 mg | ORAL_TABLET | Freq: Every day | ORAL | Status: DC
  Administered 2017-04-07 – 2017-04-09 (×3): 40 mg via ORAL
  Filled 2017-04-07 (×3): qty 2

## 2017-04-07 MED ORDER — ALBUTEROL SULFATE HFA 108 (90 BASE) MCG/ACT IN AERS: 2.0000 | INHALATION_SPRAY | Freq: Four times a day (QID) | RESPIRATORY_TRACT | Status: DC | PRN

## 2017-04-07 MED ORDER — BUDESONIDE 0.25 MG/2ML IN SUSP
0.2500 mg | Freq: Two times a day (BID) | RESPIRATORY_TRACT | Status: DC
  Administered 2017-04-07 – 2017-04-09 (×5): 0.25 mg via RESPIRATORY_TRACT
  Filled 2017-04-07 (×5): qty 2

## 2017-04-07 MED ORDER — CARVEDILOL 12.5 MG PO TABS
12.5000 mg | ORAL_TABLET | Freq: Two times a day (BID) | ORAL | Status: DC
  Administered 2017-04-07 – 2017-04-09 (×5): 12.5 mg via ORAL
  Filled 2017-04-07 (×5): qty 1

## 2017-04-07 MED ORDER — IPRATROPIUM-ALBUTEROL 0.5-2.5 (3) MG/3ML IN SOLN: 3.0000 mL | Freq: Four times a day (QID) | RESPIRATORY_TRACT | Status: DC | PRN

## 2017-04-07 MED ORDER — INSULIN ASPART 100 UNIT/ML ~~LOC~~ SOLN: 0.0000 [IU] | Freq: Three times a day (TID) | SUBCUTANEOUS | Status: DC

## 2017-04-07 MED ORDER — POTASSIUM CHLORIDE 10 MEQ/100ML IV SOLN
10.0000 meq | INTRAVENOUS | Status: AC
  Administered 2017-04-07 (×3): 10 meq via INTRAVENOUS
  Filled 2017-04-07 (×3): qty 100

## 2017-04-07 MED ORDER — VANCOMYCIN HCL IN DEXTROSE 1-5 GM/200ML-% IV SOLN
1000.0000 mg | Freq: Once | INTRAVENOUS | Status: DC
  Filled 2017-04-07: qty 200

## 2017-04-07 MED ORDER — ALPRAZOLAM 1 MG PO TABS
1.0000 mg | ORAL_TABLET | Freq: Three times a day (TID) | ORAL | Status: DC | PRN
  Administered 2017-04-07 – 2017-04-09 (×7): 1 mg via ORAL
  Filled 2017-04-07 (×7): qty 1

## 2017-04-07 MED ORDER — FLUTICASONE PROPIONATE HFA 110 MCG/ACT IN AERO: 1.0000 | INHALATION_SPRAY | Freq: Two times a day (BID) | RESPIRATORY_TRACT | Status: DC

## 2017-04-07 MED ORDER — PANTOPRAZOLE SODIUM 40 MG IV SOLR
40.0000 mg | Freq: Two times a day (BID) | INTRAVENOUS | Status: DC
  Administered 2017-04-07 – 2017-04-09 (×5): 40 mg via INTRAVENOUS
  Filled 2017-04-07 (×5): qty 40

## 2017-04-07 MED ORDER — ACETAMINOPHEN 325 MG PO TABS
650.0000 mg | ORAL_TABLET | Freq: Four times a day (QID) | ORAL | Status: DC | PRN
  Administered 2017-04-07: 650 mg via ORAL
  Filled 2017-04-07: qty 2

## 2017-04-07 MED ORDER — ORAL CARE MOUTH RINSE: 15.0000 mL | Freq: Two times a day (BID) | OROMUCOSAL | Status: DC

## 2017-04-07 MED ORDER — INSULIN ASPART 100 UNIT/ML ~~LOC~~ SOLN: 0.0000 [IU] | Freq: Every day | SUBCUTANEOUS | Status: DC

## 2017-04-07 MED ORDER — SODIUM CHLORIDE 0.9% FLUSH
3.0000 mL | Freq: Two times a day (BID) | INTRAVENOUS | Status: DC
  Administered 2017-04-07 – 2017-04-09 (×5): 3 mL via INTRAVENOUS

## 2017-04-07 MED ORDER — ONDANSETRON HCL 4 MG PO TABS: 4.0000 mg | ORAL_TABLET | Freq: Four times a day (QID) | ORAL | Status: DC | PRN

## 2017-04-07 MED ORDER — LISINOPRIL 20 MG PO TABS
20.0000 mg | ORAL_TABLET | Freq: Every day | ORAL | Status: DC
  Administered 2017-04-07 – 2017-04-09 (×3): 20 mg via ORAL
  Filled 2017-04-07 (×3): qty 1

## 2017-04-07 NOTE — Progress Notes (Signed)
K+ level of 2.6. MD notified. No new orders at this time.

## 2017-04-07 NOTE — Consult Note (Addendum)
Name: Traci Duran MRN: [id number] DOB: 26-Jan-1956    ADMISSION DATE:  04/06/2017 CONSULTATION DATE: 04/06/2017  REFERRING MD :  Dr. Tobi Bastos  CHIEF COMPLAINT:  Shortness of Breath   BRIEF PATIENT DESCRIPTION:  61 yo female admitted with acute on chronic hypoxic respiratory failure, gastrointestinal bleeding hgb 6.6, lactic acidosis, and hypertension urgency   SIGNIFICANT EVENTS  04/30-Pt admitted to Stepdown Unit   STUDIES:  None   HISTORY OF PRESENT ILLNESS:   This is a 61 yo female with a PMH of PAD, HTN, Hyperlipidemia, Depression, CAD, COPD, and Anxiety.  She presented to [facility] ER 04/30 with c/o sudden onset of shortness of breath.  The pt states on 04/29 her acid reflux symptoms were worse causing her to vomit and have a burning sensation in her chest, and she became immediately short of breath with resolution of symptoms after she took her protonix.  However, on 04/30 she began to have the same symptoms, therefore she took protonix however her symptoms did not resolve resulting in current ER visit.  She also endorses having dark stools and weakness over the past several months she denies any other signs of active bleeding she does take aspirin and plavix.  Upon arrival to the ER it was noted the pts O2 sats were 72%-79% on RA, therefore she was placed on Bipap and her systolic bp was 220's/100's requiring a nitroglycerin gtt briefly.  Lab results revealed K+ 3.0, alk phos 152, AST 45, ALT 31, lactic acid 4.9, WBC 20.2, and hgb 6.6.  She was subsequently admitted to ICU with acute on chronic hypoxic respiratory failure, gastrointestinal bleeding, and hypertension requiring further workup and management.  PAST MEDICAL HISTORY :   has a past medical history of Anxiety; COPD (chronic obstructive pulmonary disease) (H[facility]); Coronary artery disease; Depression; Hyperlipidemia; Hypertension; and PAD (peripheral artery disease) ([facility]).  has a past surgical history that includes Appendectomy  and Hip fracture surgery. Prior to Admission medications   Medication Sig Start Date End Date Taking? Authorizing Provider  albuterol (VENTOLIN HFA) 108 (90 BASE) MCG/ACT inhaler Inhale 2 puffs into the lungs every 6 (six) hours as needed. For shortness of breath    Yes Historical Provider, MD  ALPRAZolam Prudy Feeler) 1 MG tablet Take 1 mg by mouth 3 (three) times daily as needed. For anxiety    Yes Historical Provider, MD  aspirin EC 81 MG tablet Take 81 mg by mouth daily.     Yes Historical Provider, MD  atorvastatin (LIPITOR) 80 MG tablet Take 80 mg by mouth daily.   Yes Historical Provider, MD  carvedilol (COREG) 12.5 MG tablet Take 12.5 mg by mouth 2 (two) times daily.     Yes Historical Provider, MD  citalopram (CELEXA) 40 MG tablet Take 40 mg by mouth daily.   Yes Historical Provider, MD  clopidogrel (PLAVIX) 75 MG tablet Take 75 mg by mouth daily.   Yes Historical Provider, MD  DULoxetine (CYMBALTA) 30 MG capsule Take 1 capsule by mouth daily. 04/06/17  Yes Historical Provider, MD  FLOVENT HFA 110 MCG/ACT inhaler Inhale 1 puff into the lungs 2 (two) times daily.  04/06/17  Yes Historical Provider, MD  gabapentin (NEURONTIN) 300 MG capsule Take 1 capsule by mouth 3 (three) times daily. 04/06/17  Yes Historical Provider, MD  lisinopril (PRINIVIL,ZESTRIL) 20 MG tablet Take 20 mg by mouth daily.     Yes Historical Provider, MD  pantoprazole (PROTONIX) 20 MG tablet Take 1 tablet by mouth daily. 04/06/17  Yes  Historical Provider, MD   Allergies  Allergen Reactions  . Gabapentin Other (See Comments)    falls  . Morphine And Related Itching  . Benadryl [Diphenhydramine Hcl] Rash  . Chantix [Varenicline] Palpitations  . Niacin Rash  . Trazodone Palpitations    FAMILY HISTORY:  family history includes Heart attack in her brother, father, and mother; Heart failure in her mother. SOCIAL HISTORY:  reports that she has been smoking Cigarettes.  She has a 5.00 pack-year smoking history. She has never  used smokeless tobacco. She reports that she uses drugs, including Marijuana. She reports that she does not drink alcohol.  REVIEW OF SYSTEMS:  Positives in BOLD Constitutional: Negative for fever, chills, weight loss, malaise/fatigue and diaphoresis.  HENT: Negative for hearing loss, ear pain, nosebleeds, congestion, sore throat, neck pain, tinnitus and ear discharge.  Eyes: Negative for blurred vision, double vision, photophobia, pain, discharge and redness.  Respiratory: cough, hemoptysis, sputum production, shortness of breath, wheezing and stridor.   Cardiovascular: Negative for chest pain, palpitations, orthopnea, claudication, leg swelling and PND.  Gastrointestinal: heartburn, nausea, vomiting, abdominal pain, diarrhea, constipation, blood in stool and melena.  Genitourinary: Negative for dysuria, urgency, frequency, hematuria and flank pain.  Musculoskeletal: Negative for myalgias, back pain, joint pain and falls.  Skin: Negative for itching and rash.  Neurological:dizziness, tingling, tremors, sensory change, speech change, focal weakness, seizures, loss of consciousness, weakness and headaches.  Endo/Heme/Allergies: Negative for environmental allergies and polydipsia. Does not bruise/bleed easily.  SUBJECTIVE:  Pt c/o headache and still having intermittent burning sensation of the chest  VITAL SIGNS: Pulse Rate:  [73-116] 83 (05/01 0000) Resp:  [9-35] 21 (05/01 0000) BP: (115-227)/(68-109) 134/76 (05/01 0000) SpO2:  [82 %-100 %] 100 % (05/01 0000)  PHYSICAL EXAMINATION: General: well developed, well nourished, Caucasian female, NAD Neuro: alert and oriented, follows commands HEENT: supple, no JVD Cardiovascular: nsr, s1s2, no M/R/G Lungs: crackles bilateral bases, even, non labored on Bipap Abdomen: +BS x4, soft, non tender, non distended  Musculoskeletal: normal bulk and tone, no edema  Skin: intact no rashes or lesions    Recent Labs Lab 04/06/17 2102  NA 137  K  3.0*  CL 104  CO2 19*  BUN 17  CREATININE 0.90  GLUCOSE 285*    Recent Labs Lab 04/06/17 2102  HGB 6.6*  HCT 23.6*  WBC 20.2*  PLT 398   Dg Chest Port 1 View  Result Date: 04/06/2017 CLINICAL DATA:  Dyspnea and hypoxia today. EXAM: PORTABLE CHEST 1 VIEW COMPARISON:  01/16/2017 FINDINGS: Moderate vascular and interstitial prominence, new. No focal airspace consolidation. No large effusion. Borderline heart size. Unremarkable hilar and mediastinal contours. IMPRESSION: Vascular and interstitial changes suggesting congestive heart failure with interstitial edema. No frank alveolar edema. No large effusion. Electronically Signed   By: Ellery Plunk M.D.   On: 04/06/2017 21:16    ASSESSMENT / PLAN: Acute on chronic hypoxic respiratory failure Anemia secondary to gastrointestinal bleeding Hypokalemia Hyperglycemia  Hypertension urgency-improving  P: Continuous Bipap or supplemental O2 for dyspnea or to maintain O2 sats 88% to 92% Echo pending Continuous telemetry monitoring  Continue outpatient atorvastatin, carvedilol, and lisinopril Continue outpatient gabapentin, citalopram, duloxetine, and prn xanax   Hold outpatient plavix and aspirin for now Obtain occult stool  Discontinue nitroglycerin gtt IV protonix Transfuse 2 units pRBC's and recheck h&h post transfusion Administer 40 mg iv lasix after 1st unit of pRBC's  Serial h&h Gastroenterology consulted appreciate input Will avoid chemical VTE prophylaxis for now, SCD's for  VTE prophylaxis  Monitor for s/sx of bleeding  Trend WBC's and monitor fever curve Trend lactic acid  Follow cultures Intermittent CXR Trend BMP's Replace electrolytes as indicated   SSI  Sonda Rumble, AGNP  Pulmonary/Critical Care Pager [phone] (please enter 7 digits) PCCM Consult Pager [phone] (please enter 7 digits)  PCCM ATTENDING ATTESTATION:  I have evaluated patient with the APP [place], reviewed database in its entirety  and discussed care plan in detail. In addition, this patient was discussed on multidisciplinary rounds. She is now much improved and hemodynamically stable without evidence of significant bleeding. I don't think there is anything to support the concern for sepsis and the elevated lactate is well explained by severe anemia. It has resolved completely and CXR is without acute infiltrates to suggest PNA. Hypoxemia has resolved and was likely due to hypertension induced pulmonary edema  Discussed with Dr Juliene Pina Will transfer out of ICU/SDU to monitored bed Potassium repletion ordered DC pip-tazo After transfer, PCCM will sign off. Please call if we can be of further assistance    Billy Fischer, MD PCCM service Mobile [phone] Pager [phone]  04/07/2017 12:54 PM

## 2017-04-07 NOTE — Progress Notes (Signed)
[facility] - [place] [facility]   PATIENT NAME: Traci Duran    MR#:  [id number]  DATE OF BIRTH:  1956-12-02  SUBJECTIVE:   Patient presented shortness of breath and anemia. She was initially placed on BiPAP and now has been weaned off and is on room air. She is feeling better. Reports no shortness of breath, chest pain or bloody stools. She has had some melanotic stools in the past.  REVIEW OF SYSTEMS:    Review of Systems  Constitutional: Negative for fever, chills weight loss HENT: Negative for ear pain, nosebleeds, congestion, facial swelling, rhinorrhea, neck pain, neck stiffness and ear discharge.   Respiratory: Negative for cough, shortness of breath, wheezing  Cardiovascular: Negative for chest pain, palpitations and leg swelling.  Gastrointestinal: Negative for heartburn, abdominal pain, vomiting, diarrhea or consitpation Genitourinary: Negative for dysuria, urgency, frequency, hematuria Musculoskeletal: Negative for back pain or joint pain Neurological: Negative for dizziness, seizures, syncope, focal weakness,  numbness and headaches.  Hematological: Does not bruise/bleed easily.  Psychiatric/Behavioral: Negative for hallucinations, confusion, dysphoric mood    Tolerating Diet: yes      DRUG ALLERGIES:   Allergies  Allergen Reactions  . Gabapentin Other (See Comments)    falls  . Morphine And Related Itching  . Benadryl [Diphenhydramine Hcl] Rash  . Chantix [Varenicline] Palpitations  . Niacin Rash  . Trazodone Palpitations    VITALS:  Blood pressure 126/87, pulse 86, temperature 97.5 F (36.4 C), temperature source Axillary, resp. rate (!) 21, height 5\' 4"  (1.626 m), weight 65.3 kg (143 lb 15.4 oz), SpO2 95 %.  PHYSICAL EXAMINATION:  Constitutional: Appears well-developed and well-nourished. No distress. HENT: Normocephalic. Marland Kitchen Oropharynx is clear and moist.  Eyes: Conjunctivae and EOM are normal. PERRLA, no scleral icterus.   Neck: Normal ROM. Neck supple. No JVD. No tracheal deviation. CVS: RRR, S1/S2 +, no murmurs, no gallops, no carotid bruit.  Pulmonary: Effort and breath sounds normal, no stridor, rhonchi, wheezes, rales.  Abdominal: Soft. BS +,  no distension, tenderness, rebound or guarding.  Musculoskeletal: Normal range of motion. No edema and no tenderness.  Neuro: Alert. CN 2-12 grossly intact. No focal deficits. Skin: Skin is warm and dry. No rash noted. Psychiatric: Normal mood and affect.      LABORATORY PANEL:   CBC  Recent Labs Lab 04/07/17 0355 04/07/17 0849  WBC 15.8*  --   HGB 8.2* 9.5*  HCT 27.0* 31.0*  PLT 275  --    ------------------------------------------------------------------------------------------------------------------  Chemistries   Recent Labs Lab 04/07/17 0355 04/07/17 0849  NA 141  --   K 3.3* 2.6*  CL 106  --   CO2 27  --   GLUCOSE 99  --   BUN 18  --   CREATININE 0.67  --   CALCIUM 8.8*  --   MG 1.8  --   AST 33  --   ALT 28  --   ALKPHOS 131*  --   BILITOT 0.5  --    ------------------------------------------------------------------------------------------------------------------  Cardiac Enzymes  Recent Labs Lab 04/06/17 2102 04/07/17 0355 04/07/17 0849  TROPONINI <0.03 0.12* 0.15*   ------------------------------------------------------------------------------------------------------------------  RADIOLOGY:  Dg Chest Port 1 View  Result Date: 04/06/2017 CLINICAL DATA:  Dyspnea and hypoxia today. EXAM: PORTABLE CHEST 1 VIEW COMPARISON:  01/16/2017 FINDINGS: Moderate vascular and interstitial prominence, new. No focal airspace consolidation. No large effusion. Borderline heart size. Unremarkable hilar and mediastinal contours. IMPRESSION: Vascular and interstitial changes suggesting congestive heart failure with interstitial edema.  No frank alveolar edema. No large effusion. Electronically Signed   By: Ellery Plunk M.D.   On:  04/06/2017 21:16     ASSESSMENT AND PLAN:   61 year old female with history of tobacco dependence and COPD who presents with acute hypoxic respiratory failure.  1. Acute hypoxic respiratory failure and the setting of pulmonary edema Check echocardiogram to evaluate for congestive heart failure Sepsis has been ruled out Lactic acid level elevated due to GI bleed  2. Acute blood loss anemia in the setting of GI bleed: Patient is status post 2 units PRBCs Hemoglobin stable Continue PPI GI consult for EGD  3. Hypokalemia: Check magnesium and replete  4. Diabetes: Continue sliding scale insulin  5. ASCVD: Continue lisinopril, Coreg and statin  6. Depression: Continue Celexa and Cymbalta  7. Tobacco dependence: Patient is encouraged to quit smoking. Counseling was provided for 4 minutes.    Management plans discussed with the patient and she is in agreement.  CODE STATUS: full  TOTAL TIME TAKING CARE OF THIS PATIENT: 30 minutes.     POSSIBLE D/C 1-2 days, DEPENDING ON CLINICAL CONDITION.   Campbell Kray M.D on 04/07/2017 at 12:50 PM  Between 7am to 6pm - Pager - [phone] After 6pm go to [email or web address] - Social research officer, government  [facility]  Office  [phone]  CC: Primary care physician; No PCP Per Patient  Note: This dictation was prepared with Dragon dictation along with smaller phrase technology. Any transcriptional errors that result from this process are unintentional.

## 2017-04-07 NOTE — Progress Notes (Signed)
PT Cancellation Note  Patient Details Name: Traci Duran MRN: [id number] DOB: 1956/06/13   Cancelled Treatment:    Reason Eval/Treat Not Completed: Medical issues which prohibited therapy (Consult received and chart reviewed.  Patient noted with critical hypokalemia (2.6); contraindicated for PT/exertional activity at this time.  Will re-attempt at later time/date as medically appropriate.)   Jaisen Wiltrout H. Owens Shark, PT, DPT, NCS 04/07/17, 3:12 PM [phone]

## 2017-04-07 NOTE — Progress Notes (Addendum)
Pharmacy consulted for electrolyte management for 61 yo female. Patient received potassium 49mEq PO x 1 and 3mEq IV x 3 on 5/1. Will obtain follow up electrolytes with am labs.   Pharmacy will continue to monitor and adjust per consult.    5/1 20:00 K+ 3.9. No supplement ordered. Recheck with AM labs  Sim Boast, PharmD, BCPS  04/08/17 12:43 AM

## 2017-04-07 NOTE — H&P (Signed)
[facility] [place] at [facility]   PATIENT NAME: Traci Duran    MR#:  [id number]  DATE OF BIRTH:  11/24/1956  DATE OF ADMISSION:  04/06/2017  PRIMARY CARE PHYSICIAN: No PCP Per Patient   REQUESTING/REFERRING PHYSICIAN:   CHIEF COMPLAINT:   Chief Complaint  Patient presents with  . Shortness of Breath    HISTORY OF PRESENT ILLNESS: Traci Duran  is a 61 y.o. female with a known history of COPD, coronary artery disease, hyperlipidemia, hypertension, peripheral arterial disease presented to the emergency room with respiratory distress and O2 saturation of 72%. Patient was put on BiPAP and stabilized in the emergency room. According to the ER physician patient also had some melanotic stool. Her hemoglobin was low around 6 during her presentation in the emergency room. Patient is on oral aspirin and Plavix at home. She was worked up with a chest x-ray in the emergency room which showed congestive changes. 2 units of packed red blood cells were ordered for transfusion. No complaints of any chest pain. Has shortness of breath and cough. Hospitalist service was consulted for further care of the patient. No history of any headache, dizziness and blurry vision. No syncope and seizure. Patient seen and evaluated on 04/06/2017.  PAST MEDICAL HISTORY:   Past Medical History:  Diagnosis Date  . Anxiety   . COPD (chronic obstructive pulmonary disease) (HCC)   . Coronary artery disease   . Depression   . Hyperlipidemia   . Hypertension   . PAD (peripheral artery disease) (HCC)     PAST SURGICAL HISTORY: Past Surgical History:  Procedure Laterality Date  . APPENDECTOMY    . HIP FRACTURE SURGERY      SOCIAL HISTORY:  Social History  Substance Use Topics  . Smoking status: Current Some Day Smoker    Packs/day: 0.25    Years: 20.00    Types: Cigarettes  . Smokeless tobacco: Never Used  . Alcohol use No     Comment: occasional    FAMILY HISTORY:  Family  History  Problem Relation Age of Onset  . Heart failure Mother   . Heart attack Mother     mother died at 41 of an MI  . Heart attack Father     father died at 99 of an MI  . Heart attack Brother     Brother had an MI in his 73s    DRUG ALLERGIES:  Allergies  Allergen Reactions  . Gabapentin Other (See Comments)    falls  . Morphine And Related Itching  . Benadryl [Diphenhydramine Hcl] Rash  . Chantix [Varenicline] Palpitations  . Niacin Rash  . Trazodone Palpitations    REVIEW OF SYSTEMS:   CONSTITUTIONAL: No fever, has weakness.  EYES: No blurred or double vision.  EARS, NOSE, AND THROAT: No tinnitus or ear pain.  RESPIRATORY: Has cough, shortness of breath,  No wheezing or hemoptysis.  CARDIOVASCULAR: No chest pain, has orthopnea,  No edema.  GASTROINTESTINAL: No nausea, vomiting, diarrhea or abdominal pain.  Has dark stool GENITOURINARY: No dysuria, hematuria.  ENDOCRINE: No polyuria, nocturia,  HEMATOLOGY: No anemia, easy bruising or bleeding SKIN: No rash or lesion. MUSCULOSKELETAL: No joint pain or arthritis.   NEUROLOGIC: No tingling, numbness, weakness.  PSYCHIATRY: No anxiety or depression.   MEDICATIONS AT HOME:  Prior to Admission medications   Medication Sig Start Date End Date Taking? Authorizing Provider  albuterol (VENTOLIN HFA) 108 (90 BASE) MCG/ACT inhaler Inhale 2 puffs into the lungs  every 6 (six) hours as needed. For shortness of breath    Yes Historical Provider, MD  ALPRAZolam Prudy Feeler) 1 MG tablet Take 1 mg by mouth 3 (three) times daily as needed. For anxiety    Yes Historical Provider, MD  aspirin EC 81 MG tablet Take 81 mg by mouth daily.     Yes Historical Provider, MD  atorvastatin (LIPITOR) 80 MG tablet Take 80 mg by mouth daily.   Yes Historical Provider, MD  carvedilol (COREG) 12.5 MG tablet Take 12.5 mg by mouth 2 (two) times daily.     Yes Historical Provider, MD  citalopram (CELEXA) 40 MG tablet Take 40 mg by mouth daily.   Yes  Historical Provider, MD  clopidogrel (PLAVIX) 75 MG tablet Take 75 mg by mouth daily.   Yes Historical Provider, MD  DULoxetine (CYMBALTA) 30 MG capsule Take 1 capsule by mouth daily. 04/06/17  Yes Historical Provider, MD  FLOVENT HFA 110 MCG/ACT inhaler Inhale 1 puff into the lungs 2 (two) times daily.  04/06/17  Yes Historical Provider, MD  gabapentin (NEURONTIN) 300 MG capsule Take 1 capsule by mouth 3 (three) times daily. 04/06/17  Yes Historical Provider, MD  lisinopril (PRINIVIL,ZESTRIL) 20 MG tablet Take 20 mg by mouth daily.     Yes Historical Provider, MD  pantoprazole (PROTONIX) 20 MG tablet Take 1 tablet by mouth daily. 04/06/17  Yes Historical Provider, MD      PHYSICAL EXAMINATION:   VITAL SIGNS: Blood pressure (!) 141/81, pulse 82, temperature (!) 96.4 F (35.8 C), temperature source Axillary, resp. rate 20, SpO2 100 %.  GENERAL:  61 y.o.-year-old patient lying in the bed with no acute distress.  EYES: Pupils equal, round, reactive to light and accommodation. No scleral icterus. Extraocular muscles intact. Pallor present. HEENT: Head atraumatic, normocephalic. Oropharynx and nasopharynx clear.  NECK:  Supple, no jugular venous distention. No thyroid enlargement, no tenderness.  LUNGS: Decreased breath sounds bilaterally, bibasilar crepitations noted. No use of accessory muscles of respiration.  CARDIOVASCULAR: S1, S2 normal. No murmurs, rubs, or gallops.  ABDOMEN: Soft, nontender, nondistended. Bowel sounds present. No organomegaly or mass.  EXTREMITIES: No pedal edema, cyanosis, or clubbing.  NEUROLOGIC: Cranial nerves II through XII are intact. Muscle strength 5/5 in all extremities. Sensation intact. Gait not checked.  PSYCHIATRIC: The patient is alert and oriented x 3.  SKIN: No obvious rash, lesion, or ulcer.   LABORATORY PANEL:   CBC  Recent Labs Lab 04/06/17 2102  WBC 20.2*  HGB 6.6*  HCT 23.6*  PLT 398  MCV 63.5*  MCH 17.9*  MCHC 28.1*  RDW 19.0*  LYMPHSABS  7.6*  MONOABS 1.2*  EOSABS 0.3  BASOSABS 0.1   ------------------------------------------------------------------------------------------------------------------  Chemistries   Recent Labs Lab 04/06/17 2102  NA 137  K 3.0*  CL 104  CO2 19*  GLUCOSE 285*  BUN 17  CREATININE 0.90  CALCIUM 8.6*  AST 45*  ALT 31  ALKPHOS 152*  BILITOT 0.3   ------------------------------------------------------------------------------------------------------------------ CrCl cannot be calculated (Unknown ideal weight.). ------------------------------------------------------------------------------------------------------------------ No results for input(s): TSH, T4TOTAL, T3FREE, THYROIDAB in the last 72 hours.  Invalid input(s): FREET3   Coagulation profile  Recent Labs Lab 04/06/17 2102  INR 1.07   ------------------------------------------------------------------------------------------------------------------- No results for input(s): DDIMER in the last 72 hours. -------------------------------------------------------------------------------------------------------------------  Cardiac Enzymes  Recent Labs Lab 04/06/17 2102  TROPONINI <0.03   ------------------------------------------------------------------------------------------------------------------ Invalid input(s): POCBNP  ---------------------------------------------------------------------------------------------------------------  Urinalysis    Component Value Date/Time   COLORURINE COLORLESS (A) 09/12/2016 0431  APPEARANCEUR CLEAR (A) 09/12/2016 0431   LABSPEC 1.001 (L) 09/12/2016 0431   PHURINE 5.0 09/12/2016 0431   GLUCOSEU NEGATIVE 09/12/2016 0431   HGBUR NEGATIVE 09/12/2016 0431   BILIRUBINUR NEGATIVE 09/12/2016 0431   KETONESUR NEGATIVE 09/12/2016 0431   PROTEINUR NEGATIVE 09/12/2016 0431   UROBILINOGEN 0.2 10/15/2011 0840   NITRITE NEGATIVE 09/12/2016 0431   LEUKOCYTESUR NEGATIVE 09/12/2016 0431      RADIOLOGY: Dg Chest Port 1 View  Result Date: 04/06/2017 CLINICAL DATA:  Dyspnea and hypoxia today. EXAM: PORTABLE CHEST 1 VIEW COMPARISON:  01/16/2017 FINDINGS: Moderate vascular and interstitial prominence, new. No focal airspace consolidation. No large effusion. Borderline heart size. Unremarkable hilar and mediastinal contours. IMPRESSION: Vascular and interstitial changes suggesting congestive heart failure with interstitial edema. No frank alveolar edema. No large effusion. Electronically Signed   By: Ellery Plunk M.D.   On: 04/06/2017 21:16    EKG: Orders placed or performed during the hospital encounter of 04/06/17  . ED EKG 12-Lead  . ED EKG 12-Lead  . EKG 12-Lead  . EKG 12-Lead    IMPRESSION AND PLAN: 61 year old female patient with history of COPD, coronary artery disease, hyperlipidemia, hypertension, peripheral arterial disease presented to the emergency room with shortness of breath, fatigue and dark stool. Admitting diagnosis 1. Acute respiratory failure 2. Pulmonary edema 3. Symptomatic anemia 4. Gastrointestinal bleeding 5. Elevated lactate level 6. Hypokalemia Treatment plan Admit patient to ICU Transfuse 2 units of packed red blood blood cells intravenously Intensivist consultation Gastroenteritis consultation Start patient on broad-spectrum antibiotics IV vancomycin and IV Zosyn Follow-up cultures and diabetes count We'll hold off on IV fluids secondary to pulmonary congestion Follow up lactate level IV Protonix 40 MG every 12 hourly Replace potassium DVT prophylaxis sequential compression device to lower extremities Hold aspirin and Plavix BiPAP for respiratory failure IV Lasix for diuresis  All the records are reviewed and case discussed with ED provider. Management plans discussed with the patient, family and they are in agreement.  CODE STATUS:FULL CODE    Code Status Orders        Start     Ordered   04/07/17 0023  Full code   Continuous     04/07/17 0022    Code Status History    Date Active Date Inactive Code Status Order ID Comments User Context   10/14/2011 10:15 PM 10/15/2011  5:27 PM Full Code [id number]  Netta Corrigan, RN Inpatient       TOTAL CRITICAL CARE TIME TAKING CARE OF THIS PATIENT: 54 minutes.    Ihor Austin M.D on 04/07/2017 at 12:59 AM  Between 7am to 6pm - Pager - [phone]  After 6pm go to [email or web address] - password [email or web address] [facility]  [facility] [place] [facility]  Office  [phone]  CC: Primary care physician; No PCP Per Patient

## 2017-04-07 NOTE — Progress Notes (Signed)
Pharmacy Antibiotic Note  Traci Duran is a 61 y.o. female admitted on 04/06/2017 with pneumonia and sepsis.  Pharmacy has been consulted for vancomycin and Zosyn dosing.  Plan: DW 65kg  Vd 46L kei 0.052 hr-1  t1/2 13 hours Vancomycin 1 gram q 18 hours ordered with stacked dosing. Level before 5th dose. Goal trough 15-20.  Zosyn 3.375 grams q 8 hours ordered.  Height: 5\' 4"  (162.6 cm) Weight: 143 lb 15.4 oz (65.3 kg) IBW/kg (Calculated) : 54.7  Temp (24hrs), Avg:96.8 F (36 C), Min:96.4 F (35.8 C), Max:97.5 F (36.4 C)   Recent Labs Lab 04/06/17 2102  WBC 20.2*  CREATININE 0.90  LATICACIDVEN 4.9*    Estimated Creatinine Clearance: 57.4 mL/min (by C-G formula based on SCr of 0.9 mg/dL).    Allergies  Allergen Reactions  . Gabapentin Other (See Comments)    falls  . Morphine And Related Itching  . Benadryl [Diphenhydramine Hcl] Rash  . Chantix [Varenicline] Palpitations  . Niacin Rash  . Trazodone Palpitations    Antimicrobials this admission: vancomycin Zosyn 5/1 >>    >>   Dose adjustments this admission:   Microbiology results: 4/30 BCx: pending 5/1 MRSA PCR: pending      4/30 CXR: no focal consolidation  Thank you for allowing pharmacy to be a part of this patient's care.  Saraia Platner S 04/07/2017 1:47 AM

## 2017-04-07 NOTE — ED Notes (Signed)
Report called to ccu nurse monique rn

## 2017-04-08 LAB — CBC
HCT: 29.4 % — ABNORMAL LOW (ref 35.0–47.0)
Hemoglobin: 9 g/dL — ABNORMAL LOW (ref 12.0–16.0)
MCH: 20.6 pg — ABNORMAL LOW (ref 26.0–34.0)
MCHC: 30.7 g/dL — ABNORMAL LOW (ref 32.0–36.0)
MCV: 67.1 fL — AB (ref 80.0–100.0)
PLATELETS: 263 10*3/uL (ref 150–440)
RBC: 4.38 MIL/uL (ref 3.80–5.20)
RDW: 24.3 % — ABNORMAL HIGH (ref 11.5–14.5)
WBC: 9.6 10*3/uL (ref 3.6–11.0)

## 2017-04-08 LAB — TYPE AND SCREEN
ABO/RH(D): O POS
Antibody Screen: NEGATIVE
UNIT DIVISION: 0
Unit division: 0

## 2017-04-08 LAB — BPAM RBC
Blood Product Expiration Date: 201805242359
Blood Product Expiration Date: 201805242359
ISSUE DATE / TIME: 201805010043
ISSUE DATE / TIME: 201805010315
UNIT TYPE AND RH: 5100
Unit Type and Rh: 5100

## 2017-04-08 LAB — GLUCOSE, CAPILLARY
Glucose-Capillary: 116 mg/dL — ABNORMAL HIGH (ref 65–99)
Glucose-Capillary: 58 mg/dL — ABNORMAL LOW (ref 65–99)
Glucose-Capillary: 66 mg/dL (ref 65–99)
Glucose-Capillary: 150 mg/dL — ABNORMAL HIGH (ref 65–99)
Glucose-Capillary: 80 mg/dL (ref 65–99)
Glucose-Capillary: 90 mg/dL (ref 65–99)
Glucose-Capillary: 126 mg/dL — ABNORMAL HIGH (ref 65–99)

## 2017-04-08 LAB — BASIC METABOLIC PANEL
Anion gap: 7 (ref 5–15)
BUN: 18 mg/dL (ref 6–20)
CALCIUM: 8.9 mg/dL (ref 8.9–10.3)
CHLORIDE: 105 mmol/L (ref 101–111)
CO2: 27 mmol/L (ref 22–32)
Creatinine, Ser: 0.62 mg/dL (ref 0.44–1.00)
GFR calc Af Amer: >60 mL/min (ref >60)
GFR calc non Af Amer: >60 mL/min (ref >60)
Glucose, Bld: 90 mg/dL (ref 65–99)
Potassium: 3.7 mmol/L (ref 3.5–5.1)
Sodium: 139 mmol/L (ref 135–145)

## 2017-04-08 LAB — URINE CULTURE: Culture: NO GROWTH

## 2017-04-08 LAB — MAGNESIUM: MAGNESIUM: 1.8 mg/dL (ref 1.7–2.4)

## 2017-04-08 LAB — TROPONIN I: Troponin I: 0.07 ng/mL (ref <0.03)

## 2017-04-08 MED ORDER — NICOTINE 21 MG/24HR TD PT24
21.0000 mg | MEDICATED_PATCH | Freq: Every day | TRANSDERMAL | Status: DC
  Administered 2017-04-08 – 2017-04-09 (×2): 21 mg via TRANSDERMAL
  Filled 2017-04-08 (×2): qty 1

## 2017-04-08 NOTE — Progress Notes (Signed)
[facility] - [place] at [facility]   PATIENT NAME: Traci Duran    MR#:  [id number]  DATE OF BIRTH:  [birth year]  SUBJECTIVE:   Patient without Chest pain or shortness of breath today. Patient denies melena or blood in her stools. Patient denies abdominal pain  REVIEW OF SYSTEMS:    Review of Systems  Constitutional: Negative for fever, chills weight loss HENT: Negative for ear pain, nosebleeds, congestion, facial swelling, rhinorrhea, neck pain, neck stiffness and ear discharge.   Respiratory: Negative for cough, shortness of breath, wheezing  Cardiovascular: Negative for chest pain, palpitations and leg swelling.  Gastrointestinal: Negative for heartburn, abdominal pain, vomiting, diarrhea or consitpation Genitourinary: Negative for dysuria, urgency, frequency, hematuria Musculoskeletal: Negative for back pain or joint pain Neurological: Negative for dizziness, seizures, syncope, focal weakness,  numbness and headaches.  Hematological: Does not bruise/bleed easily.  Psychiatric/Behavioral: Negative for hallucinations, confusion, dysphoric mood    Tolerating Diet: yes      DRUG ALLERGIES:   Allergies  Allergen Reactions  . Gabapentin Other (See Comments)    falls  . Morphine And Related Itching  . Benadryl [Diphenhydramine Hcl] Rash  . Chantix [Varenicline] Palpitations  . Niacin Rash  . Trazodone Palpitations    VITALS:  Blood pressure (!) 147/77, pulse 73, temperature 97.8 F (36.6 C), temperature source Oral, resp. rate 16, height 5\' 7"  (1.702 m), weight 62.6 kg (138 lb), SpO2 96 %.  PHYSICAL EXAMINATION:  Constitutional: Appears well-developed and well-nourished. No distress. HENT: Normocephalic. Marland Kitchen Oropharynx is clear and moist.  Eyes: Conjunctivae and EOM are normal. PERRLA, no scleral icterus.  Neck: Normal ROM. Neck supple. No JVD. No tracheal deviation. CVS: RRR, S1/S2 +, no murmurs, no gallops, no carotid bruit.  Pulmonary: Effort  and breath sounds normal, no stridor, rhonchi, wheezes, rales.  Abdominal: Soft. BS +,  no distension, tenderness, rebound or guarding.  Musculoskeletal: Normal range of motion. No edema and no tenderness.  Neuro: Alert. CN 2-12 grossly intact. No focal deficits. Skin: Skin is warm and dry. No rash noted. Psychiatric: Normal mood and affect.      LABORATORY PANEL:   CBC  Recent Labs Lab 04/08/17 0548  WBC 9.6  HGB 9.0*  HCT 29.4*  PLT 263   ------------------------------------------------------------------------------------------------------------------  Chemistries   Recent Labs Lab 04/07/17 0355  04/08/17 0548  NA 141  --  139  K 3.3*  < > 3.7  CL 106  --  105  CO2 27  --  27  GLUCOSE 99  --  90  BUN 18  --  18  CREATININE 0.67  --  0.62  CALCIUM 8.8*  --  8.9  MG 1.8  < > 1.8  AST 33  --   --   ALT 28  --   --   ALKPHOS 131*  --   --   BILITOT 0.5  --   --   < > = values in this interval not displayed. ------------------------------------------------------------------------------------------------------------------  Cardiac Enzymes  Recent Labs Lab 04/07/17 0355 04/07/17 0849 04/08/17 0548  TROPONINI 0.12* 0.15* 0.07*   ------------------------------------------------------------------------------------------------------------------  RADIOLOGY:  Dg Chest Port 1 View  Result Date: 04/06/2017 CLINICAL DATA:  Dyspnea and hypoxia today. EXAM: PORTABLE CHEST 1 VIEW COMPARISON:  01/16/2017 FINDINGS: Moderate vascular and interstitial prominence, new. No focal airspace consolidation. No large effusion. Borderline heart size. Unremarkable hilar and mediastinal contours. IMPRESSION: Vascular and interstitial changes suggesting congestive heart failure with interstitial edema. No frank alveolar edema. No large  effusion. Electronically Signed   By: Ellery Plunk M.D.   On: 04/06/2017 21:16     ASSESSMENT AND PLAN:   61 year old female with history of  tobacco dependence and COPD who presents with acute hypoxic respiratory failure.  1. Acute hypoxic respiratory failure and the setting of Acute on chronic diastolic heart failure:  Echocardiogram in October 2017 shows preserved ejection fraction with grade 1 diastolic dysfunction.  Patient has diuresed and is doing well.  Shortness of breath was also due to profound anemia on admission.  Sepsis has been ruled out Lactic acid level elevated due to GI bleed  2. Acute blood loss anemia in the setting of GI bleed: Patient is status post 2 units PRBCs Hemoglobin now stable Continue PPI GI consult for EGD Nothing by mouth after midnight for EGD tomorrow. Case discussed with Dr. Midge Minium  3. Hypokalemia: Repleted 4. Diabetes: Continue sliding scale insulin  5. ASCVD: Continue lisinopril, Coreg and statin  6. Depression: Continue Celexa and Cymbalta  7. Tobacco dependence: Patient is encouraged to quit smoking. Counseling was provided for 4 minutes.  Case discussed with Dr. Tobi Bastos and Dr.Wohl as well as nursing  Management plans discussed with the patient and she is in agreement.  CODE STATUS: full  TOTAL TIME TAKING CARE OF THIS PATIENT: 30 minutes.     POSSIBLE D/C 1-2 days, DEPENDING ON CLINICAL CONDITION.   Caisen Mangas M.D on 04/08/2017 at 11:33 AM  Between 7am to 6pm - Pager - [phone] After 6pm go to [email or web address] - password [email or web address]  [facility] [place] [facility]  Office  [phone]  CC: Primary care physician; [facility]  Note: This dictation was prepared with Dragon dictation along with smaller phrase technology. Any transcriptional errors that result from this process are unintentional.

## 2017-04-08 NOTE — Progress Notes (Signed)
MEDICATION RELATED CONSULT NOTE -follow up  Pharmacy Consult for electrolyte management Indication: hypokalemia  Allergies  Allergen Reactions  . Gabapentin Other (See Comments)    falls  . Morphine And Related Itching  . Benadryl [Diphenhydramine Hcl] Rash  . Chantix [Varenicline] Palpitations  . Niacin Rash  . Trazodone Palpitations   Labs:  Recent Labs  04/06/17 2102 04/07/17 0355 04/07/17 0849 04/08/17 0548  WBC 20.2* 15.8*  --  9.6  HGB 6.6* 8.2* 9.5* 9.0*  HCT 23.6* 27.0* 31.0* 29.4*  PLT 398 275  --  263  CREATININE 0.90 0.67  --  0.62  MG  --  1.8 1.7 1.8  ALBUMIN 3.6 3.6  --   --   PROT 6.7 6.7  --   --   AST 45* 33  --   --   ALT 31 28  --   --   ALKPHOS 152* 131*  --   --   BILITOT 0.3 0.5  --   --    Lab Results  Component Value Date   K 3.7 04/08/2017   Estimated Creatinine Clearance: 72.7 mL/min (by C-G formula based on SCr of 0.62 mg/dL).  Medical History: Past Medical History:  Diagnosis Date  . Anxiety   . COPD (chronic obstructive pulmonary disease) ([place])   . Coronary artery disease   . Depression   . Hyperlipidemia   . Hypertension   . PAD (peripheral artery disease) (HCC)     Medications:  Scheduled:  . atorvastatin  80 mg Oral Daily  . budesonide (PULMICORT) nebulizer solution  0.25 mg Nebulization BID  . carvedilol  12.5 mg Oral BID  . citalopram  40 mg Oral Daily  . DULoxetine  30 mg Oral Daily  . gabapentin  300 mg Oral TID  . insulin aspart  0-15 Units Subcutaneous TID WC  . insulin aspart  0-5 Units Subcutaneous QHS  . lisinopril  20 mg Oral Daily  . pantoprazole (PROTONIX) IV  40 mg Intravenous Q12H  . sodium chloride flush  3 mL Intravenous Q12H  . sodium chloride flush  3 mL Intravenous Q12H   Infusions:  . sodium chloride Stopped (04/07/17 0047)  . sodium chloride      Assessment: 61 yo F admitted with acute hypoxic respiratory failure and GI bleed.  Goal of Therapy:  Electrolytes WNL  Plan:  K 3.7  Mag 1.8     Scr 0.62 Electrolytes WNL- no supplementation needed. Will f/u with am labs.  Raylee Strehl A 04/08/2017,8:40 AM

## 2017-04-08 NOTE — Evaluation (Signed)
Physical Therapy Evaluation Patient Details Name: Traci Duran MRN: [id number] DOB: 09/02/1956 Today's Date: 04/08/2017   History of Present Illness  Traci Duran is a 61 y.o. female with a known history of COPD, coronary artery disease, hyperlipidemia, hypertension, peripheral arterial disease presented to the emergency room with respiratory distress and O2 saturation of 72%. Patient was put on BiPAP and stabilized in the emergency room. According to the ER physician patient also had some melanotic stool. Her hemoglobin was low around 6 during her presentation in the emergency room. Patient is on oral aspirin and Plavix at home. She was worked up with a chest x-ray in the emergency room which showed congestive changes. 2 units of packed red blood cells were ordered for transfusion. No complaints of any chest pain. Has shortness of breath and cough. At time of PT evaluation pt reports complete resolution of symptoms. She states that she is ready to discharge home.   Clinical Impression  Pt demonstrates independence with bed mobility and transfers. She is supervision only for ambulation. Pt able to ambulate a full lap around RN station and back to room. She demonstrates some mild slowing of gait with vertical and horizontal head turns but no lateral gait deviation. Able to perform gait speed changes without gait abnormalities. SaO2 remains at or above 98% on room air and HR remains in the 70-80s bpm. She presents with some higher level balance difficulties in single leg stance. She has a history of fall with L hip fracture. She reports an increased fear of falling. She would benefit from OP PT to work on balance training to reduce her risk for falls and decrease her fear. Pt instructed today in some simple balance exercises she can perform at home. No further needs while admitted to [facility]. Will complete order. Please enter new order if status or needs change.     Follow Up Recommendations Outpatient  PT;Other (comment) (For balance)    Equipment Recommendations  None recommended by PT    Recommendations for Other Services       Precautions / Restrictions Precautions Precautions: None Restrictions Weight Bearing Restrictions: No      Mobility  Bed Mobility Overal bed mobility: Independent             General bed mobility comments: Good speed/sequencing  Transfers Overall transfer level: Independent Equipment used: None             General transfer comment: Good speed, sequencing, and stability in standing  Ambulation/Gait Ambulation/Gait assistance: Supervision Ambulation Distance (Feet): 200 Feet Assistive device: None Gait Pattern/deviations: WFL(Within Functional Limits) Gait velocity: WFL for community ambulation Gait velocity interpretation: <1.8 ft/sec, indicative of risk for recurrent falls General Gait Details: Pt able to ambulate a full lap around RN station and back to room. She demonstrates some mild slowing of gait with vertical and horizontal head turns but no lateral gait deviation. Able to perform gait speed changes without gait abnormalities. SaO2 remains at or above 98% on room air and HR remains in the 70-80s bpm.   Stairs            Wheelchair Mobility    Modified Rankin (Stroke Patients Only)       Balance Overall balance assessment: Needs assistance Sitting-balance support: No upper extremity supported Sitting balance-Leahy Scale: Good     Standing balance support: No upper extremity supported Standing balance-Leahy Scale: Fair Standing balance comment: Good balance with feet together/apart. Negative Rhomberg. Single leg balance around 5s bilaterally. Pt  Physical Therapy Evaluation Patient Details Name: Traci Duran MRN: [id number] DOB: 07/18/1956 Today's Date: 04/08/2017   History of Present Illness  Traci Duran is a 60 y.o. female with a known history of COPD, coronary artery disease, hyperlipidemia, hypertension, peripheral arterial disease presented to the emergency room with respiratory distress and O2 saturation of 72%. Patient was put on BiPAP and stabilized in the emergency room. According to the ER physician patient also had some melanotic stool. Her hemoglobin was low around 6 during her presentation in the emergency room. Patient is on oral aspirin and Plavix at home. She was worked up with a chest x-ray in the emergency room which showed congestive changes. 2 units of packed red blood cells were ordered for transfusion. No complaints of any chest pain. Has shortness of breath and cough. At time of PT evaluation pt reports complete resolution of symptoms. She states that she is ready to discharge home.   Clinical Impression  Pt demonstrates independence with bed mobility and transfers. She is supervision only for ambulation. Pt able to ambulate a full lap around RN station and back to room. She demonstrates some mild slowing of gait with vertical and horizontal head turns but no lateral gait deviation. Able to perform gait speed changes without gait abnormalities. SaO2 remains at or above 98% on room air and HR remains in the 70-80s bpm. She presents with some higher level balance difficulties in single leg stance. She has a history of fall with L hip fracture. She reports an increased fear of falling. She would benefit from OP PT to work on balance training to reduce her risk for falls and decrease her fear. Pt instructed today in some simple balance exercises she can perform at home. No further needs while admitted to [facility]. Will complete order. Please enter new order if status or needs change.     Follow Up Recommendations Outpatient  PT;Other (comment) (For balance)    Equipment Recommendations  None recommended by PT    Recommendations for Other Services       Precautions / Restrictions Precautions Precautions: None Restrictions Weight Bearing Restrictions: No      Mobility  Bed Mobility Overal bed mobility: Independent             General bed mobility comments: Good speed/sequencing  Transfers Overall transfer level: Independent Equipment used: None             General transfer comment: Good speed, sequencing, and stability in standing  Ambulation/Gait Ambulation/Gait assistance: Supervision Ambulation Distance (Feet): 200 Feet Assistive device: None Gait Pattern/deviations: WFL(Within Functional Limits) Gait velocity: WFL for community ambulation Gait velocity interpretation: <1.8 ft/sec, indicative of risk for recurrent falls General Gait Details: Pt able to ambulate a full lap around RN station and back to room. She demonstrates some mild slowing of gait with vertical and horizontal head turns but no lateral gait deviation. Able to perform gait speed changes without gait abnormalities. SaO2 remains at or above 98% on room air and HR remains in the 70-80s bpm.   Stairs            Wheelchair Mobility    Modified Rankin (Stroke Patients Only)       Balance Overall balance assessment: Needs assistance Sitting-balance support: No upper extremity supported Sitting balance-Leahy Scale: Good     Standing balance support: No upper extremity supported Standing balance-Leahy Scale: Fair Standing balance comment: Good balance with feet together/apart. Negative Rhomberg. Single leg balance around 5s bilaterally. Pt

## 2017-04-08 NOTE — Progress Notes (Signed)
Inpatient Diabetes Program Recommendations  AACE/ADA: New Consensus Statement on Inpatient Glycemic Control (2015)  Target Ranges:  Prepandial:   less than 140 mg/dL      Peak postprandial:   less than 180 mg/dL (1-2 hours)      Critically ill patients:  140 - 180 mg/dL  Results for Traci Duran, Traci Duran (MRN [id number]) as of 04/08/2017 11:41  Ref. Range 04/07/2017 08:54 04/07/2017 12:07 04/07/2017 18:11 04/07/2017 21:49 04/08/2017 07:33 04/08/2017 11:17 04/08/2017 11:18  Glucose-Capillary Latest Ref Range: 65 - 99 mg/dL 82 115 (H) 91 98 116 (H) 58 (L) 66   Results for Traci Duran, Traci Duran (MRN [id number]) as of 04/08/2017 11:41  Ref. Range 01/16/2017 22:41 04/06/2017 21:02 04/07/2017 03:55 04/08/2017 05:48  Glucose Latest Ref Range: 65 - 99 mg/dL 65 285 (H) 99 90  Results for Traci Duran, Traci Duran (MRN [id number]) as of 04/08/2017 11:41  Ref. Range 04/06/2017 21:02  Hemoglobin Latest Ref Range: 12.0 - 16.0 g/dL 6.6 (L)   Review of Glycemic Control  Diabetes history: NO Outpatient Diabetes medications: NA Current orders for Inpatient glycemic control: Novolog 0-15 units TID with meals, Novolog 0-5 units QHS  Inpatient Diabetes Program Recommendations: Correction (SSI): No insulin has been given since admitted and per chart review patient does not have a documented history of DM. Please discontinue Novolog correction scale.  NOTE: Noted initial glucose 285 mg/dl on 04/06/17. Anticipate initial glucose of 285 mg/dl was stress related. Glucose has ranged from 58-116 mg/dl since patient was admitted.  Thanks, Barnie Alderman, RN, MSN, CDE Diabetes Coordinator Inpatient Diabetes Program [phone] (Team Pager from 8am to 5pm)

## 2017-04-08 NOTE — Plan of Care (Signed)
Problem: Safety: Goal: Ability to remain free from injury will improve Outcome: Completed/Met Date Met: 04/08/17 Pt does not score high enough for bed alarm , however encouraged to call if help is needed getting up.  Problem: Pain Managment: Goal: General experience of comfort will improve Outcome: Completed/Met Date Met: 04/08/17 No complaints of pain this shift. Will continue to monitor.  Problem: Bowel/Gastric: Goal: Will not experience complications related to bowel motility Outcome: Completed/Met Date Met: 04/08/17 BM this shift 04/07/17.

## 2017-04-08 NOTE — Progress Notes (Signed)
About 8pm Patient stated she was going outside to smoke. Informed patient that she was not to do that, that we are a no smoking facility and that she should not leave the floor.  Patient stated that she was going to go anyway. She did not go down at that time however.  Later patient seen being wheeled back to her room from outside.

## 2017-04-08 NOTE — Plan of Care (Signed)
Problem: Bowel/Gastric: Goal: Will show no signs and symptoms of gastrointestinal bleeding Outcome: Progressing No active bleeding noted today/ hbg level has remained stable/ GI consult still pending  Problem: Education: Goal: Ability to identify signs and symptoms of gastrointestinal bleeding will improve Outcome: Progressing Pt informed to let staff know when she has had a BM so RN can assess stool

## 2017-04-09 ENCOUNTER — Encounter: Payer: Self-pay | Admitting: Anesthesiology

## 2017-04-09 ENCOUNTER — Encounter
Admission: EM | Disposition: A | Discharge status: Home / Self Care | Payer: Self-pay | Source: Home / Self Care | Attending: Internal Medicine

## 2017-04-09 ENCOUNTER — Inpatient Hospital Stay: Payer: Medicare Other | Admitting: Anesthesiology

## 2017-04-09 DIAGNOSIS — D500 Iron deficiency anemia secondary to blood loss (chronic): Secondary | ICD-10-CM

## 2017-04-09 HISTORY — PX: ESOPHAGOGASTRODUODENOSCOPY (EGD) WITH PROPOFOL: SHX5813

## 2017-04-09 LAB — BASIC METABOLIC PANEL
Anion gap: 6 (ref 5–15)
BUN: 18 mg/dL (ref 6–20)
CO2: 27 mmol/L (ref 22–32)
CREATININE: 0.75 mg/dL (ref 0.44–1.00)
Calcium: 8.9 mg/dL (ref 8.9–10.3)
Chloride: 106 mmol/L (ref 101–111)
GFR calc Af Amer: >60 mL/min (ref >60)
GLUCOSE: 89 mg/dL (ref 65–99)
Potassium: 4.1 mmol/L (ref 3.5–5.1)
SODIUM: 139 mmol/L (ref 135–145)

## 2017-04-09 LAB — CBC
HCT: 29.5 % — ABNORMAL LOW (ref 35.0–47.0)
Hemoglobin: 9.2 g/dL — ABNORMAL LOW (ref 12.0–16.0)
MCH: 21.2 pg — ABNORMAL LOW (ref 26.0–34.0)
MCHC: 31.3 g/dL — ABNORMAL LOW (ref 32.0–36.0)
MCV: 67.5 fL — ABNORMAL LOW (ref 80.0–100.0)
PLATELETS: 268 10*3/uL (ref 150–440)
RBC: 4.37 MIL/uL (ref 3.80–5.20)
RDW: 24.3 % — ABNORMAL HIGH (ref 11.5–14.5)
WBC: 10.3 10*3/uL (ref 3.6–11.0)

## 2017-04-09 LAB — GLUCOSE, CAPILLARY
GLUCOSE-CAPILLARY: 83 mg/dL (ref 65–99)
Glucose-Capillary: 92 mg/dL (ref 65–99)

## 2017-04-09 LAB — MAGNESIUM: Magnesium: 1.9 mg/dL (ref 1.7–2.4)

## 2017-04-09 SURGERY — ESOPHAGOGASTRODUODENOSCOPY (EGD) WITH PROPOFOL
Anesthesia: General

## 2017-04-09 MED ORDER — LIDOCAINE HCL (CARDIAC) 20 MG/ML IV SOLN
INTRAVENOUS | Status: DC | PRN
  Administered 2017-04-09: 60 mg via INTRAVENOUS

## 2017-04-09 MED ORDER — GLYCOPYRROLATE 0.2 MG/ML IJ SOLN
INTRAMUSCULAR | Status: AC
  Filled 2017-04-09: qty 1

## 2017-04-09 MED ORDER — PANTOPRAZOLE SODIUM 40 MG PO TBEC: 40.0000 mg | DELAYED_RELEASE_TABLET | Freq: Every day | ORAL | 0 refills | Status: DC

## 2017-04-09 MED ORDER — FENTANYL CITRATE (PF) 100 MCG/2ML IJ SOLN
INTRAMUSCULAR | Status: DC | PRN
  Administered 2017-04-09: 50 ug via INTRAVENOUS

## 2017-04-09 MED ORDER — GLYCOPYRROLATE 0.2 MG/ML IJ SOLN
INTRAMUSCULAR | Status: DC | PRN
  Administered 2017-04-09: 0.2 mg via INTRAVENOUS

## 2017-04-09 MED ORDER — NICOTINE 21 MG/24HR TD PT24: 21.0000 mg | MEDICATED_PATCH | Freq: Every day | TRANSDERMAL | 0 refills | Status: DC

## 2017-04-09 MED ORDER — PROPOFOL 10 MG/ML IV BOLUS
INTRAVENOUS | Status: AC
  Filled 2017-04-09: qty 20

## 2017-04-09 MED ORDER — SODIUM CHLORIDE 0.9 % IV SOLN
INTRAVENOUS | Status: DC
  Administered 2017-04-09: 1000 mL via INTRAVENOUS

## 2017-04-09 MED ORDER — FENTANYL CITRATE (PF) 100 MCG/2ML IJ SOLN
INTRAMUSCULAR | Status: AC
  Filled 2017-04-09: qty 2

## 2017-04-09 MED ORDER — LIDOCAINE HCL 2 % IJ SOLN
INTRAMUSCULAR | Status: AC
  Filled 2017-04-09: qty 10

## 2017-04-09 MED ORDER — PROPOFOL 10 MG/ML IV BOLUS
INTRAVENOUS | Status: DC | PRN
  Administered 2017-04-09: 10 mg via INTRAVENOUS
  Administered 2017-04-09: 60 mg via INTRAVENOUS

## 2017-04-09 NOTE — Progress Notes (Signed)
Discharge instructions expalined to pt/ verbalized an understanding/ iv and tele removed/ RX given to pt/ personal belongings returned to pt/ transported off unit via wheelchair.

## 2017-04-09 NOTE — Op Note (Signed)
[facility] Gastroenterology Patient Name: Traci Duran Procedure Date: 04/09/2017 10:34 AM MRN: [id number] Account #: [id number] Date of Birth: [birth year] Admit Type: Inpatient Age: 61 Room: [facility] ENDO ROOM 1 Gender: Female Note Status: Finalized Procedure:            Upper GI endoscopy Indications:          Iron deficiency anemia secondary to chronic blood loss Providers:            Midge Minium MD, MD Referring MD:         [facility] ***Heber Christine, MD (Referring                        MD) Medicines:            Propofol per Anesthesia Complications:        No immediate complications. Procedure:            Pre-Anesthesia Assessment:                       - Prior to the procedure, a History and Physical was                        performed, and patient medications and allergies were                        reviewed. The patient's tolerance of previous                        anesthesia was also reviewed. The risks and benefits of                        the procedure and the sedation options and risks were                        discussed with the patient. All questions were                        answered, and informed consent was obtained. Prior                        Anticoagulants: The patient has taken Plavix                        (clopidogrel). ASA Grade Assessment: II - A patient                        with mild systemic disease. After reviewing the risks                        and benefits, the patient was deemed in satisfactory                        condition to undergo the procedure.                       After obtaining informed consent, the endoscope was                        passed under direct vision. Throughout the procedure,  the patient's blood pressure, pulse, and oxygen                        saturations were monitored continuously. The Endoscope                        was introduced through the mouth, and  advanced to the                        second part of duodenum. The upper GI endoscopy was                        accomplished without difficulty. The patient tolerated                        the procedure well. Findings:      The examined esophagus was normal.      Localized mild inflammation characterized by erythema was found in the       gastric antrum.      A single 5 mm sessile polyp was found in the duodenal bulb. Biopsies       were taken with a cold forceps for histology. Impression:           - Normal esophagus.                       - Gastritis.                       - A single duodenal polyp. Biopsied. Recommendation:       - Await pathology results.                       - Return patient to hospital ward for ongoing care. Procedure Code(s):    --- Professional ---                       [id number], Esophagogastroduodenoscopy, flexible, transoral;                        with biopsy, single or multiple Diagnosis Code(s):    --- Professional ---                       D50.0, Iron deficiency anemia secondary to blood loss                        (chronic)                       K29.70, Gastritis, unspecified, without bleeding                       K31.7, Polyp of stomach and duodenum CPT copyright 2016 American Medical Association. All rights reserved. The codes documented in this report are preliminary and upon coder review may  be revised to meet current compliance requirements. Midge Minium MD, MD 04/09/2017 11:04:06 AM This report has been signed electronically. Number of Addenda: 0 Note Initiated On: 04/09/2017 10:34 AM      [facility]

## 2017-04-09 NOTE — Anesthesia Preprocedure Evaluation (Signed)
Anesthesia Evaluation  Patient identified by MRN, date of birth, ID band Patient awake    Reviewed: Allergy & Precautions, H&P , NPO status , Patient's Chart, lab work & pertinent test results, reviewed documented beta blocker date and time   History of Anesthesia Complications Negative for: history of anesthetic complications  Airway Mallampati: III  TM Distance: >3 FB Neck ROM: full    Dental  (+) Missing, Chipped, Poor Dentition   Pulmonary shortness of breath, neg sleep apnea, COPD,  COPD inhaler, Recent URI , Resolved, Current Smoker,           Cardiovascular Exercise Tolerance: Good hypertension, (-) angina+ CAD, + Cardiac Stents and + Peripheral Vascular Disease  (-) Past MI and (-) CABG (-) dysrhythmias + Valvular Problems/Murmurs      Neuro/Psych neg Seizures PSYCHIATRIC DISORDERS (Depression) CVA negative psych ROS   GI/Hepatic Neg liver ROS, GERD  ,  Endo/Other  negative endocrine ROS  Renal/GU negative Renal ROS  negative genitourinary   Musculoskeletal   Abdominal   Peds  Hematology negative hematology ROS (+)   Anesthesia Other Findings Past Medical History: No date: Anxiety No date: COPD (chronic obstructive pulmonary disease) (* No date: Coronary artery disease No date: Depression No date: Hyperlipidemia No date: Hypertension No date: PAD (peripheral artery disease) (HCC)   Reproductive/Obstetrics negative OB ROS                             Anesthesia Physical Anesthesia Plan  ASA: III  Anesthesia Plan: General   Post-op Pain Management:    Induction:   Airway Management Planned:   Additional Equipment:   Intra-op Plan:   Post-operative Plan:   Informed Consent: I have reviewed the patients History and Physical, chart, labs and discussed the procedure including the risks, benefits and alternatives for the proposed anesthesia with the patient or authorized  representative who has indicated his/her understanding and acceptance.   Dental Advisory Given  Plan Discussed with: Anesthesiologist, CRNA and Surgeon  Anesthesia Plan Comments:         Anesthesia Quick Evaluation

## 2017-04-09 NOTE — Progress Notes (Signed)
Chaplain visited patient while patient was getting ready to be discharged. Patient was looking for her clothes to change. Chaplain left patient since she wanted to change her clothes. Patient appreciated a short visit Chaplain provided.   04/09/17 1300  Clinical Encounter Type  Visited With Patient  Visit Type Initial  Spiritual Encounters  Spiritual Needs Other (Comment)

## 2017-04-09 NOTE — Progress Notes (Signed)
[facility] [place] [facility]   PATIENT NAME: Traci Duran    MR#:  [id number]  DATE OF BIRTH:  Apr 21, 1956  SUBJECTIVE:   Patient doing well this morning. Denies chest pain or shortness of breath. No GI bleeding. REVIEW OF SYSTEMS:    Review of Systems  Constitutional: Negative for fever, chills weight loss HENT: Negative for ear pain, nosebleeds, congestion, facial swelling, rhinorrhea, neck pain, neck stiffness and ear discharge.   Respiratory: Negative for cough, shortness of breath, wheezing  Cardiovascular: Negative for chest pain, palpitations and leg swelling.  Gastrointestinal: Negative for heartburn, abdominal pain, vomiting, diarrhea or consitpation Genitourinary: Negative for dysuria, urgency, frequency, hematuria Musculoskeletal: Negative for back pain or joint pain Neurological: Negative for dizziness, seizures, syncope, focal weakness,  numbness and headaches.  Hematological: Does not bruise/bleed easily.  Psychiatric/Behavioral: Negative for hallucinations, confusion, dysphoric mood    Tolerating Diet: yes      DRUG ALLERGIES:   Allergies  Allergen Reactions  . Gabapentin Other (See Comments)    falls  . Morphine And Related Itching  . Benadryl [Diphenhydramine Hcl] Rash  . Chantix [Varenicline] Palpitations  . Niacin Rash  . Trazodone Palpitations    VITALS:  Blood pressure 139/65, pulse 66, temperature 97.9 F (36.6 C), temperature source Oral, resp. rate 18, height 5\' 7"  (1.702 m), weight 62.6 kg (138 lb), SpO2 97 %.  PHYSICAL EXAMINATION:  Constitutional: Appears well-developed and well-nourished. No distress. HENT: Normocephalic. Marland Kitchen Oropharynx is clear and moist.  Eyes: Conjunctivae and EOM are normal. PERRLA, no scleral icterus.  Neck: Normal ROM. Neck supple. No JVD. No tracheal deviation. CVS: RRR, S1/S2 +, no murmurs, no gallops, no carotid bruit.  Pulmonary: Effort and breath sounds normal, no stridor, rhonchi,  wheezes, rales.  Abdominal: Soft. BS +,  no distension, tenderness, rebound or guarding.  Musculoskeletal: Normal range of motion. No edema and no tenderness.  Neuro: Alert. CN 2-12 grossly intact. No focal deficits. Skin: Skin is warm and dry. No rash noted. Psychiatric: Normal mood and affect.      LABORATORY PANEL:   CBC  Recent Labs Lab 04/09/17 0436  WBC 10.3  HGB 9.2*  HCT 29.5*  PLT 268   ------------------------------------------------------------------------------------------------------------------  Chemistries   Recent Labs Lab 04/07/17 0355  04/09/17 0436  NA 141  < > 139  K 3.3*  < > 4.1  CL 106  < > 106  CO2 27  < > 27  GLUCOSE 99  < > 89  BUN 18  < > 18  CREATININE 0.67  < > 0.75  CALCIUM 8.8*  < > 8.9  MG 1.8  < > 1.9  AST 33  --   --   ALT 28  --   --   ALKPHOS 131*  --   --   BILITOT 0.5  --   --   < > = values in this interval not displayed. ------------------------------------------------------------------------------------------------------------------  Cardiac Enzymes  Recent Labs Lab 04/07/17 0355 04/07/17 0849 04/08/17 0548  TROPONINI 0.12* 0.15* 0.07*   ------------------------------------------------------------------------------------------------------------------  RADIOLOGY:  No results found.   ASSESSMENT AND PLAN:   61 year old female with history of tobacco dependence and COPD who presents with acute hypoxic respiratory failure.  1. Acute hypoxic respiratory failure and the setting of Acute on chronic diastolic heart failure:  Echocardiogram in October 2017 shows preserved ejection fraction with grade 1 diastolic dysfunction.  Patient has diuresed and is doing well.  Shortness of breath was also due to  profound anemia on admission.  Sepsis has been ruled out Lactic acid level elevated due to GI bleed  2. Acute blood loss anemia in the setting of GI bleed: Patient is status post 2 units PRBCs Hemoglobin now  stable Continue PPI Plan for EGD today.   3. Hypokalemia: Repleted 4. Diabetes: Continue sliding scale insulin  5. ASCVD: Continue lisinopril, Coreg and statin  6. Depression: Continue Celexa and Cymbalta  7. Tobacco dependence: Patient is encouraged to quit smoking. Counseling was provided for 4 minutes.  Case discussed  Dr.Wohl as well as nursing  Management plans discussed with the patient and she is in agreement.  CODE STATUS: full  TOTAL TIME TAKING CARE OF THIS PATIENT: 22 minutes.     POSSIBLE D/C today depending on EGD results.,   Cyara Devoto M.D on 04/09/2017 at 10:10 AM  Between 7am to 6pm - Pager - [phone] After 6pm go to [email or web address] - password [email or web address]  [facility] [place] [facility]  Office  [phone]  CC: Primary care physician; [facility]  Note: This dictation was prepared with Dragon dictation along with smaller phrase technology. Any transcriptional errors that result from this process are unintentional.

## 2017-04-09 NOTE — Progress Notes (Signed)
MEDICATION RELATED CONSULT NOTE -follow up  Pharmacy Consult for electrolyte management Indication: hypokalemia  Allergies  Allergen Reactions  . Gabapentin Other (See Comments)    falls  . Morphine And Related Itching  . Benadryl [Diphenhydramine Hcl] Rash  . Chantix [Varenicline] Palpitations  . Niacin Rash  . Trazodone Palpitations   Labs:  Recent Labs  04/06/17 2102  04/07/17 0355 04/07/17 0849 04/08/17 0548 04/09/17 0436  WBC 20.2*  --  15.8*  --  9.6 10.3  HGB 6.6*  --  8.2* 9.5* 9.0* 9.2*  HCT 23.6*  --  27.0* 31.0* 29.4* 29.5*  PLT 398  --  275  --  263 268  CREATININE 0.90  --  0.67  --  0.62 0.75  MG  --   < > 1.8 1.7 1.8 1.9  ALBUMIN 3.6  --  3.6  --   --   --   PROT 6.7  --  6.7  --   --   --   AST 45*  --  33  --   --   --   ALT 31  --  28  --   --   --   ALKPHOS 152*  --  131*  --   --   --   BILITOT 0.3  --  0.5  --   --   --   < > = values in this interval not displayed. Lab Results  Component Value Date   K 4.1 04/09/2017   Estimated Creatinine Clearance: 72.7 mL/min (by C-G formula based on SCr of 0.75 mg/dL).  Medical History: Past Medical History:  Diagnosis Date  . Anxiety   . COPD (chronic obstructive pulmonary disease) ([place])   . Coronary artery disease   . Depression   . Hyperlipidemia   . Hypertension   . PAD (peripheral artery disease) (HCC)     Medications:  Scheduled:  . atorvastatin  80 mg Oral Daily  . budesonide (PULMICORT) nebulizer solution  0.25 mg Nebulization BID  . carvedilol  12.5 mg Oral BID  . citalopram  40 mg Oral Daily  . DULoxetine  30 mg Oral Daily  . gabapentin  300 mg Oral TID  . lisinopril  20 mg Oral Daily  . nicotine  21 mg Transdermal Daily  . pantoprazole (PROTONIX) IV  40 mg Intravenous Q12H  . sodium chloride flush  3 mL Intravenous Q12H  . sodium chloride flush  3 mL Intravenous Q12H   Infusions:  . sodium chloride Stopped (04/07/17 0047)  . sodium chloride      Assessment: 61 yo F  admitted with acute hypoxic respiratory failure and GI bleed.  Goal of Therapy:  Electrolytes WNL  Plan:  Electrolytes WNL x 2 days. No further supplement warranted. Will recheck electrolytes tomorrow with AM labs.   Laural Benes, Pharm.D., BCPS Clinical Pharmacist 04/09/2017,7:43 AM

## 2017-04-09 NOTE — Anesthesia Procedure Notes (Signed)
Performed by: Lance Muss Pre-anesthesia Checklist: Patient identified, Emergency Drugs available, Suction available, Timeout performed and Patient being monitored Patient Re-evaluated:Patient Re-evaluated prior to inductionOxygen Delivery Method: Nasal cannula Preoxygenation: Pre-oxygenation with 100% oxygen

## 2017-04-09 NOTE — Transfer of Care (Signed)
Immediate Anesthesia Transfer of Care Note  Patient: MAISEN SCHMIT  Procedure(s) Performed: Procedure(s): ESOPHAGOGASTRODUODENOSCOPY (EGD) WITH PROPOFOL (N/A)  Patient Location: PACU  Anesthesia Type:General  Level of Consciousness: sedated and responds to stimulation  Airway & Oxygen Therapy: Patient Spontanous Breathing and Patient connected to nasal cannula oxygen  Post-op Assessment: Report given to RN and Post -op Vital signs reviewed and stable  Post vital signs: Reviewed and stable  Last Vitals:  Vitals:   04/09/17 1105 04/09/17 1107  BP: 95/64 95/64  Pulse: 66 67  Resp: 10 (!) 8  Temp: (!) 35.8 C     Last Pain:  Vitals:   04/09/17 1105  TempSrc: Tympanic  PainSc:       Patients Stated Pain Goal: 2 (96/78/93 8101)  Complications: No apparent anesthesia complications

## 2017-04-09 NOTE — Anesthesia Post-op Follow-up Note (Cosign Needed)
Anesthesia QCDR form completed.        

## 2017-04-09 NOTE — Discharge Summary (Signed)
[facility] - [place] at [facility]   PATIENT NAME: Traci Duran    MR#:  [id number]  DATE OF BIRTH:  [birth year]  DATE OF ADMISSION:  04/06/2017 ADMITTING PHYSICIAN: Ihor Austin, MD  DATE OF DISCHARGE: 04/09/2017  PRIMARY CARE PHYSICIAN: [facility]    ADMISSION DIAGNOSIS:  Melena [K92.1] Flash pulmonary edema (HCC) [J81.0]  DISCHARGE DIAGNOSIS:  Acute hypoxic resp failure Anemia Chest pain  SECONDARY DIAGNOSIS:   Past Medical History:  Diagnosis Date  . Anxiety   . COPD (chronic obstructive pulmonary disease) (HCC)   . Coronary artery disease   . Depression   . Hyperlipidemia   . Hypertension   . PAD (peripheral artery disease) [facility])     HOSPITAL COURSE:   61 year old female with history of tobacco dependence and COPD who presents with acute hypoxic respiratory failure.  1. Acute hypoxic respiratory failure and the setting of Acute on chronic diastolic heart failure:  Echocardiogram in October 2017 shows preserved ejection fraction with grade 1 diastolic dysfunction.  Patient has diuresed and is doing well.  Shortness of breath was also due to profound anemia on admission.  Sepsis has been ruled out Lactic acid level elevated due to GI bleed  2. Acute blood loss anemia in the setting of GI bleed: Patient is status post 2 units PRBCs Hemoglobin now stable Continue PPI EGD shows mild gastritis no source of bleeding. She needs outpatient colonoscopy.  3. Hypokalemia: Repleted 4. Diabetes: Continue ADA diet 5. ASCVD: Continue lisinopril, Coreg and statin  6. Depression: Continue Celexa and Cymbalta  7. Tobacco dependence: Patient is encouraged to quit smoking. Counseling was provided for 4 minutes.   DISCHARGE CONDITIONS AND DIET:   Stable heart healthy diet  CONSULTS OBTAINED:  Treatment Team:  Midge Minium, MD  DRUG ALLERGIES:   Allergies  Allergen Reactions  . Gabapentin Other (See Comments)    falls  .  Morphine And Related Itching  . Benadryl [Diphenhydramine Hcl] Rash  . Chantix [Varenicline] Palpitations  . Niacin Rash  . Trazodone Palpitations    DISCHARGE MEDICATIONS:   Current Discharge Medication List    START taking these medications   Details  nicotine (NICODERM CQ - DOSED IN MG/24 HOURS) 21 mg/24hr patch Place 1 patch (21 mg total) onto the skin daily. Qty: 28 patch, Refills: 0      CONTINUE these medications which have CHANGED   Details  pantoprazole (PROTONIX) 40 MG tablet Take 1 tablet (40 mg total) by mouth daily. Qty: 30 tablet, Refills: 0      CONTINUE these medications which have NOT CHANGED   Details  albuterol (VENTOLIN HFA) 108 (90 BASE) MCG/ACT inhaler Inhale 2 puffs into the lungs every 6 (six) hours as needed. For shortness of breath     ALPRAZolam (XANAX) 1 MG tablet Take 1 mg by mouth 3 (three) times daily as needed. For anxiety     aspirin EC 81 MG tablet Take 81 mg by mouth daily.      atorvastatin (LIPITOR) 80 MG tablet Take 80 mg by mouth daily.    carvedilol (COREG) 12.5 MG tablet Take 12.5 mg by mouth 2 (two) times daily.      citalopram (CELEXA) 40 MG tablet Take 40 mg by mouth daily.    clopidogrel (PLAVIX) 75 MG tablet Take 75 mg by mouth daily.    DULoxetine (CYMBALTA) 30 MG capsule Take 1 capsule by mouth daily.    FLOVENT HFA 110 MCG/ACT inhaler Inhale 1 puff  into the lungs 2 (two) times daily.     gabapentin (NEURONTIN) 300 MG capsule Take 1 capsule by mouth 3 (three) times daily.    lisinopril (PRINIVIL,ZESTRIL) 20 MG tablet Take 20 mg by mouth daily.            Today   CHIEF COMPLAINT:  Doing well without CP this am or GIB   VITAL SIGNS:  Blood pressure 140/67, pulse 64, temperature (!) 96.3 F (35.7 C), temperature source Tympanic, resp. rate 17, height 5\' 7"  (1.702 m), weight 62.6 kg (138 lb), SpO2 96 %.   REVIEW OF SYSTEMS:  Review of Systems  Constitutional: Negative.  Negative for chills, fever and  malaise/fatigue.  HENT: Negative.  Negative for ear discharge, ear pain, hearing loss, nosebleeds and sore throat.   Eyes: Negative.  Negative for blurred vision and pain.  Respiratory: Negative.  Negative for cough, hemoptysis, shortness of breath and wheezing.   Cardiovascular: Negative.  Negative for chest pain, palpitations and leg swelling.  Gastrointestinal: Negative.  Negative for abdominal pain, blood in stool, diarrhea, nausea and vomiting.  Genitourinary: Negative.  Negative for dysuria.  Musculoskeletal: Negative.  Negative for back pain.  Skin: Negative.   Neurological: Negative for dizziness, tremors, speech change, focal weakness, seizures and headaches.  Endo/Heme/Allergies: Negative.  Does not bruise/bleed easily.  Psychiatric/Behavioral: Negative.  Negative for depression, hallucinations and suicidal ideas.     PHYSICAL EXAMINATION:  GENERAL:  62 y.o.-year-old patient lying in the bed with no acute distress.  NECK:  Supple, no jugular venous distention. No thyroid enlargement, no tenderness.  LUNGS: Normal breath sounds bilaterally, no wheezing, rales,rhonchi  No use of accessory muscles of respiration.  CARDIOVASCULAR: S1, S2 normal. No murmurs, rubs, or gallops.  ABDOMEN: Soft, non-tender, non-distended. Bowel sounds present. No organomegaly or mass.  EXTREMITIES: No pedal edema, cyanosis, or clubbing.  PSYCHIATRIC: The patient is alert and oriented x 3.  SKIN: No obvious rash, lesion, or ulcer.   DATA REVIEW:   CBC  Recent Labs Lab 04/09/17 0436  WBC 10.3  HGB 9.2*  HCT 29.5*  PLT 268    Chemistries   Recent Labs Lab 04/07/17 0355  04/09/17 0436  NA 141  < > 139  K 3.3*  < > 4.1  CL 106  < > 106  CO2 27  < > 27  GLUCOSE 99  < > 89  BUN 18  < > 18  CREATININE 0.67  < > 0.75  CALCIUM 8.8*  < > 8.9  MG 1.8  < > 1.9  AST 33  --   --   ALT 28  --   --   ALKPHOS 131*  --   --   BILITOT 0.5  --   --   < > = values in this interval not  displayed.  Cardiac Enzymes  Recent Labs Lab 04/07/17 0355 04/07/17 0849 04/08/17 0548  TROPONINI 0.12* 0.15* 0.07*    Microbiology Results  @MICRORSLT48 @  RADIOLOGY:  No results found.    Current Discharge Medication List    START taking these medications   Details  nicotine (NICODERM CQ - DOSED IN MG/24 HOURS) 21 mg/24hr patch Place 1 patch (21 mg total) onto the skin daily. Qty: 28 patch, Refills: 0      CONTINUE these medications which have CHANGED   Details  pantoprazole (PROTONIX) 40 MG tablet Take 1 tablet (40 mg total) by mouth daily. Qty: 30 tablet, Refills: 0      CONTINUE  these medications which have NOT CHANGED   Details  albuterol (VENTOLIN HFA) 108 (90 BASE) MCG/ACT inhaler Inhale 2 puffs into the lungs every 6 (six) hours as needed. For shortness of breath     ALPRAZolam (XANAX) 1 MG tablet Take 1 mg by mouth 3 (three) times daily as needed. For anxiety     aspirin EC 81 MG tablet Take 81 mg by mouth daily.      atorvastatin (LIPITOR) 80 MG tablet Take 80 mg by mouth daily.    carvedilol (COREG) 12.5 MG tablet Take 12.5 mg by mouth 2 (two) times daily.      citalopram (CELEXA) 40 MG tablet Take 40 mg by mouth daily.    clopidogrel (PLAVIX) 75 MG tablet Take 75 mg by mouth daily.    DULoxetine (CYMBALTA) 30 MG capsule Take 1 capsule by mouth daily.    FLOVENT HFA 110 MCG/ACT inhaler Inhale 1 puff into the lungs 2 (two) times daily.     gabapentin (NEURONTIN) 300 MG capsule Take 1 capsule by mouth 3 (three) times daily.    lisinopril (PRINIVIL,ZESTRIL) 20 MG tablet Take 20 mg by mouth daily.            Management plans discussed with the patient and she is in agreement. Stable for discharge home  Patient should follow up with pcp  CODE STATUS:     Code Status Orders        Start     Ordered   04/07/17 0023  Full code  Continuous     04/07/17 0022    Code Status History    Date Active Date Inactive Code Status Order ID  Comments User Context   10/14/2011 10:15 PM 10/15/2011  5:27 PM Full Code [id number]  Netta Corrigan, RN Inpatient      TOTAL TIME TAKING CARE OF THIS PATIENT: 37 minutes.    Note: This dictation was prepared with Dragon dictation along with smaller phrase technology. Any transcriptional errors that result from this process are unintentional.  Quintrell Baze M.D on 04/09/2017 at 11:03 AM  Between 7am to 6pm - Pager - [phone] After 6pm go to [email or web address] - Social research officer, government  [facility]  Office  [phone]  CC: Primary care physician; [facility]

## 2017-04-10 ENCOUNTER — Encounter: Payer: Self-pay | Admitting: Gastroenterology

## 2017-04-10 LAB — SURGICAL PATHOLOGY

## 2017-04-10 NOTE — Anesthesia Postprocedure Evaluation (Signed)
Anesthesia Post Note  Patient: Traci Duran  Procedure(s) Performed: Procedure(s) (LRB): ESOPHAGOGASTRODUODENOSCOPY (EGD) WITH PROPOFOL (N/A)  Patient location during evaluation: Endoscopy Anesthesia Type: General Level of consciousness: awake and alert Pain management: pain level controlled Vital Signs Assessment: post-procedure vital signs reviewed and stable Respiratory status: spontaneous breathing, nonlabored ventilation, respiratory function stable and patient connected to nasal cannula oxygen Cardiovascular status: blood pressure returned to baseline and stable Postop Assessment: no signs of nausea or vomiting Anesthetic complications: no     Last Vitals:  Vitals:   04/09/17 1240 04/09/17 1305  BP: (!) 152/79 (!) 176/81  Pulse: 83 79  Resp: 18 17  Temp:  36.6 C    Last Pain:  Vitals:   04/09/17 1105  TempSrc: Tympanic  PainSc:                  Martha Clan

## 2017-04-11 LAB — CULTURE, BLOOD (ROUTINE X 2)
CULTURE: NO GROWTH
Culture: NO GROWTH
Special Requests: ADEQUATE

## 2017-04-13 ENCOUNTER — Encounter: Payer: Self-pay | Admitting: Gastroenterology

## 2017-04-16 DIAGNOSIS — M810 Age-related osteoporosis without current pathological fracture: Secondary | ICD-10-CM | POA: Insufficient documentation

## 2017-05-11 ENCOUNTER — Ambulatory Visit (INDEPENDENT_AMBULATORY_CARE_PROVIDER_SITE_OTHER): Payer: Medicare Other | Admitting: Gastroenterology

## 2017-05-11 ENCOUNTER — Encounter: Payer: Self-pay | Admitting: Gastroenterology

## 2017-05-11 ENCOUNTER — Other Ambulatory Visit: Payer: Self-pay

## 2017-05-11 VITALS — BP 147/76 | HR 74 | Ht 67.0 in | Wt 141.5 lb

## 2017-05-11 DIAGNOSIS — K922 Gastrointestinal hemorrhage, unspecified: Secondary | ICD-10-CM | POA: Diagnosis not present

## 2017-05-11 NOTE — Progress Notes (Signed)
Primary Care Physician: [facility]  Primary Gastroenterologist:  Dr. Lucilla Lame  Chief Complaint  Patient presents with  . GI Bleeding    hospital follow up  . Bloated     weight gain    HPI: Traci Duran is a 61 y.o. female here follow-up after being the hospital with anemia.  The patient's hemoglobin was stable when the patient was discharged.  The patient had undergone an upper endoscopy while in the hospital without any cause for her anemia being found.  The patient has never had a colonoscopy.  That she was followed up as an outpatient with her primary care provider but did not have any blood work. She denies seeing any black stools or bloody stools.  Current Outpatient Prescriptions  Medication Sig Dispense Refill  . albuterol (VENTOLIN HFA) 108 (90 BASE) MCG/ACT inhaler Inhale 2 puffs into the lungs every 6 (six) hours as needed. For shortness of breath     . ALPRAZolam (XANAX) 1 MG tablet Take 1 mg by mouth 3 (three) times daily as needed. For anxiety     . aspirin EC 81 MG tablet Take 81 mg by mouth daily.      Marland Kitchen atorvastatin (LIPITOR) 80 MG tablet Take 80 mg by mouth daily.    . Blood Pressure Monitoring (RA BLOOD PRESSURE CUFF MONITOR) MISC Check blood pressure twice weekly after sitting for ten minutes    . busPIRone (BUSPAR) 10 MG tablet   2  . carvedilol (COREG) 12.5 MG tablet Take 12.5 mg by mouth 2 (two) times daily.      . Cholecalciferol (VITAMIN D) 2000 units tablet Take by mouth.    . citalopram (CELEXA) 40 MG tablet Take 40 mg by mouth daily.    . clopidogrel (PLAVIX) 75 MG tablet Take 75 mg by mouth daily.    . ferrous sulfate 325 (65 FE) MG EC tablet Take by mouth.    Cristy Friedlander HFA 110 MCG/ACT inhaler Inhale 1 puff into the lungs 2 (two) times daily.     Marland Kitchen gabapentin (NEURONTIN) 300 MG capsule Take 1 capsule by mouth 3 (three) times daily.    Marland Kitchen lisinopril (PRINIVIL,ZESTRIL) 20 MG tablet Take 20 mg by mouth daily.      . Misc. Devices  (QUAD CANE) MISC Use 1 Units once daily.    . nicotine (NICODERM CQ - DOSED IN MG/24 HOURS) 21 mg/24hr patch Place 1 patch (21 mg total) onto the skin daily. 28 patch 0  . nitroGLYCERIN (NITROSTAT) 0.4 MG SL tablet Place under the tongue.    . pantoprazole (PROTONIX) 40 MG tablet Take 1 tablet (40 mg total) by mouth daily. 30 tablet 0  . QVAR REDIHALER 40 MCG/ACT inhaler     . traMADol (ULTRAM) 50 MG tablet Take 50 mg by mouth.    . Vitamin D, Ergocalciferol, (DRISDOL) 50000 units CAPS capsule TAKE 1 CAPSULE  50,000 UNITS TOTAL  BY MOUTH ONCE A WEEK  3  . ibuprofen (ADVIL,MOTRIN) 400 MG tablet Take 400 mg by mouth.    Marland Kitchen LORazepam (ATIVAN) 1 MG tablet Take 1 mg by mouth.     No current facility-administered medications for this visit.     Allergies as of 05/11/2017 - Review Complete 05/11/2017  Allergen Reaction Noted  . Diphenhydramine hcl Shortness Of Breath   . Gabapentin Other (See Comments) 04/24/2016  . Pregabalin Other (See Comments) 04/24/2016  . Morphine and related Itching 09/12/2016  . Benadryl [diphenhydramine hcl]  Rash 10/14/2011  . Chantix [varenicline] Palpitations 09/06/2012  . Niacin Rash   . Trazodone Palpitations 05/23/2015    ROS:  General: Negative for anorexia, weight loss, fever, chills, fatigue, weakness. ENT: Negative for hoarseness, difficulty swallowing , nasal congestion. CV: Negative for chest pain, angina, palpitations, dyspnea on exertion, peripheral edema.  Respiratory: Negative for dyspnea at rest, dyspnea on exertion, cough, sputum, wheezing.  GI: See history of present illness. GU:  Negative for dysuria, hematuria, urinary incontinence, urinary frequency, nocturnal urination.  Endo: Negative for unusual weight change.    Physical Examination:   BP (!) 147/76   Pulse 74   Ht 5\' 7"  (1.702 m)   Wt 141 lb 8 oz (64.2 kg)   BMI 22.16 kg/m   General: Well-nourished, well-developed in no acute distress.  Eyes: No icterus. Conjunctivae  pink. Mouth: Oropharyngeal mucosa moist and pink , no lesions erythema or exudate. Lungs: Clear to auscultation bilaterally. Non-labored. Heart: Regular rate and rhythm, no murmurs rubs or gallops.  Abdomen: Bowel sounds are normal, nontender, nondistended, no hepatosplenomegaly or masses, no abdominal bruits or hernia , no rebound or guarding.   Extremities: No lower extremity edema. No clubbing or deformities. Neuro: Alert and oriented x 3.  Grossly intact. Skin: Warm and dry, no jaundice.   Psych: Alert and cooperative, normal mood and affect.  Labs:    Imaging Studies: No results found.  Assessment and Plan:   Traci Duran is a 61 y.o. y/o female comes in today for follow-up after being discharged from hospital.  The patient has not had any further blood work done since being discharged.  The patient also denies seeing any sign of GI bleeding.  The patient has a rather colonoscopy and will be set up for a colonoscopy to look for the source of her acute anemia requiring hospital admission. I have discussed risks & benefits which include, but are not limited to, bleeding, infection, perforation & drug reaction.  The patient agrees with this plan & written consent will be obtained.       Lucilla Lame, MD. Marval Regal   Note: This dictation was prepared with Dragon dictation along with smaller phrase technology. Any transcriptional errors that result from this process are unintentional.

## 2017-05-12 LAB — CBC WITH DIFFERENTIAL/PLATELET
BASOS: 1 %
Basophils Absolute: 0.1 10*3/uL (ref 0.0–0.2)
EOS (ABSOLUTE): 0.2 10*3/uL (ref 0.0–0.4)
Eos: 2 %
Hematocrit: 30 % — ABNORMAL LOW (ref 34.0–46.6)
Hemoglobin: 8.8 g/dL — ABNORMAL LOW (ref 11.1–15.9)
IMMATURE GRANS (ABS): 0 10*3/uL (ref 0.0–0.1)
Immature Granulocytes: 0 %
LYMPHS ABS: 2.5 10*3/uL (ref 0.7–3.1)
LYMPHS: 29 %
MCH: 20 pg — AB (ref 26.6–33.0)
MCHC: 29.3 g/dL — ABNORMAL LOW (ref 31.5–35.7)
MCV: 68 fL — AB (ref 79–97)
Monocytes Absolute: 0.5 10*3/uL (ref 0.1–0.9)
Monocytes: 6 %
NEUTROS ABS: 5.3 10*3/uL (ref 1.4–7.0)
Neutrophils: 62 %
Platelets: 308 10*3/uL (ref 150–379)
RBC: 4.4 x10E6/uL (ref 3.77–5.28)
RDW: 20.6 % — ABNORMAL HIGH (ref 12.3–15.4)
WBC: 8.5 10*3/uL (ref 3.4–10.8)

## 2017-05-15 ENCOUNTER — Telehealth: Payer: Self-pay

## 2017-05-15 NOTE — Telephone Encounter (Signed)
Pt notified of lab results

## 2017-05-15 NOTE — Telephone Encounter (Signed)
-----   Message from Lucilla Lame, MD sent at 05/12/2017  7:58 AM EDT ----- Let the patient know that her blood count is stable and has not gone up or down.

## 2017-05-28 ENCOUNTER — Encounter: Payer: Self-pay | Admitting: *Deleted

## 2017-05-28 ENCOUNTER — Telehealth: Payer: Self-pay | Admitting: Gastroenterology

## 2017-05-28 ENCOUNTER — Emergency Department
Admission: EM | Admit: 2017-05-28 | Discharge: 2017-05-28 | Disposition: A | Discharge status: Home / Self Care | Payer: Medicare Other | Attending: Emergency Medicine | Admitting: Emergency Medicine

## 2017-05-28 DIAGNOSIS — F17210 Nicotine dependence, cigarettes, uncomplicated: Secondary | ICD-10-CM | POA: Diagnosis not present

## 2017-05-28 DIAGNOSIS — I2510 Atherosclerotic heart disease of native coronary artery without angina pectoris: Secondary | ICD-10-CM | POA: Diagnosis not present

## 2017-05-28 DIAGNOSIS — J449 Chronic obstructive pulmonary disease, unspecified: Secondary | ICD-10-CM | POA: Insufficient documentation

## 2017-05-28 DIAGNOSIS — I10 Essential (primary) hypertension: Secondary | ICD-10-CM | POA: Diagnosis not present

## 2017-05-28 DIAGNOSIS — Z7982 Long term (current) use of aspirin: Secondary | ICD-10-CM | POA: Insufficient documentation

## 2017-05-28 DIAGNOSIS — K625 Hemorrhage of anus and rectum: Secondary | ICD-10-CM | POA: Diagnosis present

## 2017-05-28 DIAGNOSIS — Z791 Long term (current) use of non-steroidal anti-inflammatories (NSAID): Secondary | ICD-10-CM | POA: Insufficient documentation

## 2017-05-28 DIAGNOSIS — Z79899 Other long term (current) drug therapy: Secondary | ICD-10-CM | POA: Insufficient documentation

## 2017-05-28 DIAGNOSIS — K922 Gastrointestinal hemorrhage, unspecified: Secondary | ICD-10-CM | POA: Diagnosis not present

## 2017-05-28 HISTORY — DX: Unspecified hemorrhoids: K64.9

## 2017-05-28 LAB — COMPREHENSIVE METABOLIC PANEL
ALT: 28 U/L (ref 14–54)
AST: 27 U/L (ref 15–41)
Albumin: 4 g/dL (ref 3.5–5.0)
Alkaline Phosphatase: 131 U/L — ABNORMAL HIGH (ref 38–126)
Anion gap: 7 (ref 5–15)
BILIRUBIN TOTAL: 0.7 mg/dL (ref 0.3–1.2)
BUN: 16 mg/dL (ref 6–20)
CO2: 24 mmol/L (ref 22–32)
Calcium: 8.9 mg/dL (ref 8.9–10.3)
Chloride: 109 mmol/L (ref 101–111)
Creatinine, Ser: 0.83 mg/dL (ref 0.44–1.00)
GFR calc Af Amer: >60 mL/min (ref >60)
Glucose, Bld: 95 mg/dL (ref 65–99)
Potassium: 2.5 mmol/L — CL (ref 3.5–5.1)
Sodium: 140 mmol/L (ref 135–145)
TOTAL PROTEIN: 7.1 g/dL (ref 6.5–8.1)

## 2017-05-28 LAB — CBC
HCT: 28.1 % — ABNORMAL LOW (ref 35.0–47.0)
Hemoglobin: 8.6 g/dL — ABNORMAL LOW (ref 12.0–16.0)
MCH: 20 pg — ABNORMAL LOW (ref 26.0–34.0)
MCHC: 30.6 g/dL — ABNORMAL LOW (ref 32.0–36.0)
MCV: 65.4 fL — ABNORMAL LOW (ref 80.0–100.0)
Platelets: 267 10*3/uL (ref 150–440)
RBC: 4.3 MIL/uL (ref 3.80–5.20)
RDW: 23 % — AB (ref 11.5–14.5)
WBC: 11.1 10*3/uL — AB (ref 3.6–11.0)

## 2017-05-28 LAB — TYPE AND SCREEN
ABO/RH(D): O POS
Antibody Screen: NEGATIVE

## 2017-05-28 MED ORDER — MORPHINE SULFATE (PF) 2 MG/ML IV SOLN
2.0000 mg | Freq: Once | INTRAVENOUS | Status: AC
  Administered 2017-05-28: 2 mg via INTRAVENOUS
  Filled 2017-05-28: qty 1

## 2017-05-28 MED ORDER — POTASSIUM CHLORIDE CRYS ER 20 MEQ PO TBCR
40.0000 meq | EXTENDED_RELEASE_TABLET | Freq: Once | ORAL | Status: AC
  Administered 2017-05-28: 40 meq via ORAL
  Filled 2017-05-28: qty 2

## 2017-05-28 MED ORDER — ALPRAZOLAM 0.5 MG PO TABS
1.0000 mg | ORAL_TABLET | Freq: Once | ORAL | Status: AC
  Administered 2017-05-28: 1 mg via ORAL
  Filled 2017-05-28: qty 2

## 2017-05-28 MED ORDER — POTASSIUM CHLORIDE ER 10 MEQ PO TBCR: 10.0000 meq | EXTENDED_RELEASE_TABLET | Freq: Every day | ORAL | 0 refills | Status: DC

## 2017-05-28 MED ORDER — ONDANSETRON HCL 4 MG/2ML IJ SOLN
4.0000 mg | Freq: Once | INTRAMUSCULAR | Status: AC
  Administered 2017-05-28: 4 mg via INTRAVENOUS
  Filled 2017-05-28: qty 2

## 2017-05-28 NOTE — Telephone Encounter (Signed)
Returned pt's call and left her a vm that it is a good idea to go to the ER due to the blood clot from her rectum this morning. Advised her Dr. Allen Norris is the GI doctor on call today so if they need a consult, he will be notified.

## 2017-05-28 NOTE — ED Provider Notes (Signed)
[facility] Emergency Department Provider Note   ____________________________________________    I have reviewed the triage vital signs and the nursing notes.   HISTORY  Chief Complaint Rectal Bleeding     HPI Traci Duran is a 61 y.o. female who presents with multiple complaints. Primarily she is concerned because she had a blood clot after stooling this morning. This has not happened again since that time. Patient is being worked up by Dr. Verl Blalock of gastroenterology for GI bleeding. She came to the emergency department because she wanted to make sure her blood counts were stable. Additionally she also complains of several bruises on her arms, chronic abdominal pain and mild chest discomfort which resolved over 6 hours ago. She called her PCP who she had an appointment with today and they referred her to the emergency department. Patient had an endoscopy while in the hospital which was unremarkable, she has a colonoscopy scheduled as an outpatient coming up.   Past Medical History:  Diagnosis Date  . Anxiety   . COPD (chronic obstructive pulmonary disease) ([place])   . Coronary artery disease   . Depression   . Hemorrhoids   . Hyperlipidemia   . Hypertension   . PAD (peripheral artery disease) [facility])     Patient Active Problem List   Diagnosis Date Noted  . Age related osteoporosis 04/16/2017  . Iron deficiency anemia secondary to blood loss (chronic)   . GI bleed 04/06/2017  . Diastolic dysfunction 01/75/1025  . Opioid contract exists 06/27/2016  . Incidental lung nodule 01/18/2016  . Traumatic hemorrhage of cerebrum ([place]) 12/04/2015  . Hypovitaminosis D 08/29/2015  . Thyroid lesion 08/29/2015  . Major depressive disorder, single episode, unspecified 08/27/2015  . 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 Gastroesophageal reflux disease 01/26/2015  . Osteopenia 01/26/2015  . Anemia, unspecified 12/27/2014  . History of fall 12/27/2014    . Greater tuberosity of humerus fracture 10/24/2013  . AVF (arteriovenous fistula) ([place]) 11/08/2011  . Anxiety 10/24/2011  . Cerebrovascular disease 10/24/2011  . Dyslipidemia 10/24/2011  . Hypertension 10/24/2011  . Tobacco abuse 10/24/2011  . Palpitations 10/24/2011  . Renal artery stenosis ([place]) 10/24/2011  . Unstable angina ([place]) 10/14/2011  . Hyperlipidemia   . Chronic obstructive pulmonary disease ([place])   . Depression   . Peripheral arterial disease ([place])   . Coronary artery disease     Past Surgical History:  Procedure Laterality Date  . APPENDECTOMY    . ESOPHAGOGASTRODUODENOSCOPY (EGD) WITH PROPOFOL N/A 04/09/2017   Procedure: ESOPHAGOGASTRODUODENOSCOPY (EGD) WITH PROPOFOL;  Surgeon: Lucilla Lame, MD;  Location: [facility] ENDOSCOPY;  Service: Endoscopy;  Laterality: N/A;  . HIP FRACTURE SURGERY      Prior to Admission medications   Medication Sig Start Date End Date Taking? Authorizing Provider  albuterol (VENTOLIN HFA) 108 (90 BASE) MCG/ACT inhaler Inhale 2 puffs into the lungs every 6 (six) hours as needed. For shortness of breath     [provider]  ALPRAZolam (XANAX) 1 MG tablet Take 1 mg by mouth 3 (three) times daily as needed. For anxiety     [provider]  aspirin EC 81 MG tablet Take 81 mg by mouth daily.      [provider]  atorvastatin (LIPITOR) 80 MG tablet Take 80 mg by mouth daily.    [provider]  Blood Pressure Monitoring (RA BLOOD PRESSURE CUFF MONITOR) MISC Check blood pressure twice weekly after sitting for ten minutes 01/24/16   [provider]  busPIRone (BUSPAR) 10 MG tablet  04/30/17   [provider]  carvedilol (COREG) 6.25 MG tablet Take 6.25 mg by mouth 2 (two) times daily. 05/28/17   [provider]  Cholecalciferol (VITAMIN D) 2000 units tablet Take by mouth. 04/16/17 04/16/18  [provider]  citalopram (CELEXA) 40 MG tablet Take 40 mg by mouth daily.    [provider]  clopidogrel (PLAVIX) 75 MG tablet Take 75 mg by mouth daily.    [provider]  DULoxetine (CYMBALTA) 30 MG capsule Take 30 mg by mouth daily. 05/28/17   [provider]  ferrous sulfate 325 (65 FE) MG EC tablet Take by mouth. 04/16/17 04/16/18  [provider]  FLOVENT HFA 110 MCG/ACT inhaler Inhale 1 puff into the lungs 2 (two) times daily.  04/06/17   [provider]  gabapentin (NEURONTIN) 300 MG capsule Take 1 capsule by mouth 3 (three) times daily. 04/06/17   [provider]  ibuprofen (ADVIL,MOTRIN) 400 MG tablet Take 400 mg by mouth. 03/24/14   [provider]  lisinopril (PRINIVIL,ZESTRIL) 20 MG tablet Take 20 mg by mouth daily.      [provider]  LORazepam (ATIVAN) 1 MG tablet Take 1 mg by mouth. 03/24/14   [provider]  Misc. Devices (QUAD CANE) MISC Use 1 Units once daily. 11/21/15   [provider]  naproxen (NAPROSYN) 500 MG tablet Take 500 mg by mouth 2 (two) times daily. 05/26/17   [provider]  nicotine (NICODERM CQ - DOSED IN MG/24 HOURS) 21 mg/24hr patch Place 1 patch (21 mg total) onto the skin daily. 04/10/17   Bettey Costa, MD  nitroGLYCERIN (NITROSTAT) 0.4 MG SL tablet Place under the tongue. 01/24/16   [provider]  pantoprazole (PROTONIX) 40 MG tablet Take 1 tablet (40 mg total) by mouth daily. Patient taking differently: Take 20 mg by mouth daily.  04/09/17   Bettey Costa, MD  QVAR REDIHALER 40 MCG/ACT inhaler  02/13/17   [provider]  traMADol (ULTRAM) 50 MG tablet Take 50 mg by mouth. 04/04/15   [provider]  Vitamin D, Ergocalciferol, (DRISDOL) 50000 units CAPS capsule TAKE 1 CAPSULE  50,000 UNITS TOTAL  BY MOUTH ONCE A WEEK 05/01/17   [provider]     Allergies Diphenhydramine hcl; Gabapentin; Pregabalin; Morphine and related; Benadryl [diphenhydramine hcl]; Chantix [varenicline]; Niacin; and Trazodone  Family History   Problem Relation Age of Onset  . Heart failure Mother   . Heart attack Mother        mother died at 5 of an MI  . Heart attack Father        father died at 71 of an MI  . Heart attack Brother        Brother had an MI in his 29s    Social History Social History  Substance Use Topics  . Smoking status: Current Some Day Smoker    Packs/day: 0.25    Years: 20.00    Types: Cigarettes  . Smokeless tobacco: Never Used  . Alcohol use No     Comment: occasional    Review of Systems  Constitutional: No fever/chills Eyes: No visual changes.  ENT: No sore throat. Cardiovascular: As above Respiratory: Denies shortness of breath. Gastrointestinal: No nausea or vomiting Genitourinary: Negative for dysuria. Musculoskeletal: Negative for back pain. Skin: As above Neurological: Negative for headaches   ____________________________________________   PHYSICAL EXAM:  VITAL SIGNS: ED Triage Vitals  Enc Vitals Group     BP 05/28/17 1119 (!) 204/117     Pulse Rate 05/28/17 1119 84     Resp 05/28/17 1119 20     Temp 05/28/17 1119 98.2 F (36.8 C)     Temp Source 05/28/17 1119 Oral     SpO2 05/28/17 1119 98 %     Weight --      Height --      Head Circumference --      Peak Flow --      Pain Score 05/28/17 1147 7     Pain Loc --      Pain Edu? --      Excl. in [place]? --     Constitutional: Alert and oriented. No acute distress.  Eyes: Conjunctivae are normal.   Nose: No congestion/rhinnorhea. Mouth/Throat: Mucous membranes are moist.    Cardiovascular: Normal rate, regular rhythm. Grossly normal heart sounds.  Good peripheral circulation. Respiratory: Normal respiratory effort.  No retractions. Lungs CTAB. Gastrointestinal: Soft and nontender. No distention.  No CVA tenderness. Genitourinary: deferred Musculoskeletal: No lower extremity tenderness nor edema.  Warm and well perfused Neurologic:  Normal speech and language. No gross focal neurologic deficits are  appreciated.  Skin:  Skin is warm, dry and intact. Small bruise to the left lateral forearm Psychiatric: Mood and affect are normal. Speech and behavior are normal.  ____________________________________________   LABS (all labs ordered are listed, but only abnormal results are displayed)  Labs Reviewed  COMPREHENSIVE METABOLIC PANEL - Abnormal; Notable for the following:       Result Value   Potassium 2.5 (*)    Alkaline Phosphatase 131 (*)    All other components within normal limits  CBC - Abnormal; Notable for the following:    WBC 11.1 (*)    Hemoglobin 8.6 (*)    HCT 28.1 (*)    MCV 65.4 (*)    MCH 20.0 (*)    MCHC 30.6 (*)    RDW 23.0 (*)    All other components within normal limits  POC OCCULT BLOOD, ED  TYPE AND SCREEN   ____________________________________________  EKG  ED ECG REPORT I, Lavonia Drafts, the attending physician, personally viewed and interpreted this ECG.  Date: 05/28/2017  Rate: 71 Rhythm: normal sinus rhythm QRS Axis: normal Intervals: normal ST/T Wave abnormalities: normal Narrative Interpretation: unremarkable  ____________________________________________  RADIOLOGY  None ____________________________________________   PROCEDURES  Procedure(s) performed: No    Critical Care performed: No ____________________________________________   INITIAL IMPRESSION / ASSESSMENT AND PLAN / ED COURSE  Pertinent labs & imaging results that were available during my care of the patient were reviewed by me and considered in my medical decision making (see chart for details).  Patient well-appearing and in no acute distress. She is greatly reassured that her hemoglobin is essentially stable. Patient is hypertensive and I suspect this is in part related to significant anxiety.  Patient's EKG is unremarkable and she has no chest pain. Abdominal pain she reports is chronic. Patient was treated with small doses of analgesic, tolerated by mouth  intake.  She is mildly hypokalemic as was repleted with by mouth potassium. I discussed with the hospitalist or evaluation for admission given her hemoglobin and reports of GI bleeding.      ____________________________________________   FINAL CLINICAL IMPRESSION(S) / ED DIAGNOSES  Final diagnoses:  Lower GI bleed      NEW MEDICATIONS STARTED DURING THIS VISIT:  New Prescriptions   No medications on  file     Note:  This document was prepared using Dragon voice recognition software and may include unintentional dictation errors.    Lavonia Drafts, MD 05/28/17 [phone]

## 2017-05-28 NOTE — ED Notes (Addendum)
Pt given gingerale and updated on next steps regarding care. Informed pt once Pharmacy was done updating medications I would be back with her XANAX. Pt verbalized understanding.

## 2017-05-28 NOTE — Consult Note (Signed)
[employer] [facility] [place] [facility]   PATIENT NAME: Traci Duran    MR#:  [id number]  DATE OF BIRTH:  [birth year]  DATE OF CONSULT:  05/28/2017  PRIMARY CARE PHYSICIAN: [name]   REQUESTING/REFERRING PHYSICIAN: Dr. Jene Every  CHIEF COMPLAINT:   Chief Complaint  Patient presents with  . Rectal Bleeding    HISTORY OF PRESENT ILLNESS:  Traci Duran  is a 61 y.o. female with a known history of Hypertension, hyperlipidemia, peripheral vascular disease, COPD, anxiety, depression who presents to the hospital due to an episode of rectal bleeding. Patient said that she was constipated and therefore took a suppository and then shortly thereafter had a bowel movement which was bloody. She had a small clot that she noticed in the toilet. She also when wiping herself notices blood on toilet paper. She is therefore worried and presented to the ER for further evaluation. Patient denies any abdominal pain, nausea, vomiting, hematemesis. She is on Plavix, and is supposed to get a colonoscopy as an outpatient coming up in 2 weeks. She presented to the ER was noted to have a hemoglobin of 8.6 which is very much close to her baseline. She had no evidence of further rectal bleeding in the ER. Hospitalist services were contacted for consultation.  PAST MEDICAL HISTORY:   Past Medical History:  Diagnosis Date  . Anxiety   . COPD (chronic obstructive pulmonary disease) ([facility])   . Coronary artery disease   . Depression   . Hemorrhoids   . Hyperlipidemia   . Hypertension   . PAD (peripheral artery disease) ([facility])     PAST SURGICAL HISTOIRY:   Past Surgical History:  Procedure Laterality Date  . APPENDECTOMY    . ESOPHAGOGASTRODUODENOSCOPY (EGD) WITH PROPOFOL N/A 04/09/2017   Procedure: ESOPHAGOGASTRODUODENOSCOPY (EGD) WITH PROPOFOL;  Surgeon: Midge Minium, MD;  Location: [facility] ENDOSCOPY;  Service: Endoscopy;  Laterality: N/A;  . HIP FRACTURE SURGERY      SOCIAL  HISTORY:   Social History  Substance Use Topics  . Smoking status: Current Some Day Smoker    Packs/day: 0.50    Years: 20.00    Types: Cigarettes  . Smokeless tobacco: Never Used  . Alcohol use No     Comment: occasional    FAMILY HISTORY:   Family History  Problem Relation Age of Onset  . Heart failure Mother   . Heart attack Mother        mother died at 48 of an MI  . Heart attack Father        father died at 14 of an MI  . Heart attack Brother        Brother had an MI in his 42s    DRUG ALLERGIES:   Allergies  Allergen Reactions  . Chantix [Varenicline] Hives, Shortness Of Breath and Palpitations  . Diphenhydramine Hcl Shortness Of Breath  . Pregabalin Other (See Comments)    Falls  . Morphine And Related Itching  . Benadryl [Diphenhydramine Hcl] Rash  . Niacin Rash  . Trazodone Palpitations    REVIEW OF SYSTEMS:   Review of Systems  Constitutional: Negative for fever and weight loss.  HENT: Negative for congestion, nosebleeds and tinnitus.   Eyes: Negative for blurred vision, double vision and redness.  Respiratory: Negative for cough, hemoptysis and shortness of breath.   Cardiovascular: Negative for chest pain, orthopnea, leg swelling and PND.  Gastrointestinal: Positive for blood in stool. Negative for abdominal pain, diarrhea, melena, nausea and  vomiting.  Genitourinary: Negative for dysuria, hematuria and urgency.  Musculoskeletal: Negative for falls and joint pain.  Neurological: Negative for dizziness, tingling, sensory change, focal weakness, seizures, weakness and headaches.  Endo/Heme/Allergies: Negative for polydipsia. Does not bruise/bleed easily.  Psychiatric/Behavioral: Negative for depression and memory loss. The patient is not nervous/anxious.      MEDICATIONS AT HOME:   Prior to Admission medications   Medication Sig Start Date End Date Taking? Authorizing Provider  acetaminophen (TYLENOL) 325 MG tablet Take 650 mg by mouth every 6  (six) hours as needed.   Yes [provider]  albuterol (VENTOLIN HFA) 108 (90 BASE) MCG/ACT inhaler Inhale 2 puffs into the lungs every 6 (six) hours as needed. For shortness of breath    Yes [provider]  ALPRAZolam (XANAX) 1 MG tablet Take 1 mg by mouth 3 (three) times daily as needed. For anxiety    Yes [provider]  atorvastatin (LIPITOR) 80 MG tablet Take 80 mg by mouth daily.   Yes [provider]  busPIRone (BUSPAR) 10 MG tablet Take 10 mg by mouth daily.  04/30/17  Yes [provider]  carvedilol (COREG) 6.25 MG tablet Take 6.25 mg by mouth 2 (two) times daily. 05/28/17  Yes [provider]  citalopram (CELEXA) 40 MG tablet Take 40 mg by mouth daily.   Yes [provider]  clopidogrel (PLAVIX) 75 MG tablet Take 75 mg by mouth daily.   Yes [provider]  DULoxetine (CYMBALTA) 30 MG capsule Take 30 mg by mouth daily. 05/28/17  Yes [provider]  FLOVENT HFA 110 MCG/ACT inhaler Inhale 1 puff into the lungs 2 (two) times daily.  04/06/17  Yes [provider]  gabapentin (NEURONTIN) 300 MG capsule Take 1 capsule by mouth 2 (two) times daily.  04/06/17  Yes [provider]  lisinopril (PRINIVIL,ZESTRIL) 20 MG tablet Take 20 mg by mouth daily.     Yes [provider]  Multiple Vitamin (MULTIVITAMIN) tablet Take 1 tablet by mouth daily.   Yes [provider]  naproxen (NAPROSYN) 500 MG tablet Take 500 mg by mouth 2 (two) times daily. 05/26/17  Yes [provider]  nitroGLYCERIN (NITROSTAT) 0.4 MG SL tablet Place under the tongue. 01/24/16  Yes [provider]  pantoprazole (PROTONIX) 40 MG tablet Take 1 tablet (40 mg total) by mouth daily. Patient taking differently: Take 20 mg by mouth daily.  04/09/17  Yes Mody, Sital, MD  nicotine (NICODERM CQ - DOSED IN MG/24 HOURS) 21 mg/24hr patch Place 1 patch (21 mg total) onto the skin daily. Patient not taking: Reported  on 05/28/2017 04/10/17   Adrian Saran, MD  potassium chloride (K-DUR) 10 MEQ tablet Take 1 tablet (10 mEq total) by mouth daily. 05/28/17   Emily Filbert, MD  Vitamin D, Ergocalciferol, (DRISDOL) 50000 units CAPS capsule TAKE 1 CAPSULE  50,000 UNITS TOTAL  BY MOUTH ONCE A WEEK 05/01/17   [provider]      VITAL SIGNS:  Blood pressure (!) 143/98, pulse 76, temperature 98.2 F (36.8 C), temperature source Oral, resp. rate 20, SpO2 96 %.  PHYSICAL EXAMINATION:  GENERAL:  61 y.o.-year-old patient lying in the bed with no acute distress.  EYES: Pupils equal, round, reactive to light and accommodation. No scleral icterus. Extraocular muscles intact.  HEENT: Head atraumatic, normocephalic. Oropharynx and nasopharynx clear.  NECK:  Supple, no jugular venous distention. No thyroid enlargement, no tenderness.  LUNGS: Normal breath sounds bilaterally, no wheezing,  rales, rhonchi . No use of accessory muscles of respiration.  CARDIOVASCULAR: S1, S2, RRR. No murmurs, rubs, gallops, clicks.  ABDOMEN: Soft, nontender, nondistended. Bowel sounds present. No organomegaly or mass.  EXTREMITIES: No pedal edema, cyanosis, or clubbing.  NEUROLOGIC: Cranial nerves II through XII are intact. No focal motor or sensory deficits appreciated bilaterally  PSYCHIATRIC: The patient is alert and oriented x 3.  SKIN: No obvious rash, lesion, or ulcer.   LABORATORY PANEL:   CBC  Recent Labs Lab 05/28/17 1204  WBC 11.1*  HGB 8.6*  HCT 28.1*  PLT 267   ------------------------------------------------------------------------------------------------------------------  Chemistries   Recent Labs Lab 05/28/17 1204  NA 140  K 2.5*  CL 109  CO2 24  GLUCOSE 95  BUN 16  CREATININE 0.83  CALCIUM 8.9  AST 27  ALT 28  ALKPHOS 131*  BILITOT 0.7   ------------------------------------------------------------------------------------------------------------------  Cardiac Enzymes No results for  input(s): TROPONINI in the last 168 hours. ------------------------------------------------------------------------------------------------------------------  RADIOLOGY:  No results found.   IMPRESSION AND PLAN:   61 year old female with past medical history of peripheral vascular disease, hypertension, hyperlipidemia, anxiety/depression, COPD who presented to the hospital due to rectal bleeding.  1. Rectal bleeding-one episode of rectal bleeding prior to coming to the ER. Her hemoglobin is stable and close to baseline. She is on Plavix which I have instructed her to stop taking for now. She has no acute need for transfusion as her hemoglobin is stable. -She is scheduled to see GI as an outpatient and get a colonoscopy done in the next 2 weeks. There is no urgent need for inpatient hospitalization and she does not meet any criteria for transfusion and she has had no further rectal bleeding in the ER. -She was advised to stop her Plavix and to follow up with GI as an outpatient. If she has worsening rectal bleeding then to come back to the emergency room.  2. Hypokalemia-patient was given some oral potassium in the ER. She was discharged on some oral potassium supplements.  3. Essential hypertension-patient will continue her Coreg, lisinopril.  4. Depression/anxiety-continue Cymbalta/Xanax/BuSpar.  5. Neuropathy-continue gabapentin.  6. HyperLipidemia-patient will continue atorvastatin.  She was discharged home from the emergency room and this was discussed with the ER physician.  All the records are reviewed and case discussed with Consulting provider. Management plans discussed with the patient, family and they are in agreement.  CODE STATUS: Full code  TOTAL TIME TAKING CARE OF THIS PATIENT: 40 minutes.    Houston Siren M.D on 05/28/2017 at 4:51 PM  Between 7am to 6pm - Pager - [phone]  After 6pm go to [email or web address] - password [email or web address]  [facility]   Office  [phone]  CC: Primary care Physician: [name]

## 2017-05-28 NOTE — ED Notes (Signed)
Patient attempted to leave facility with IV still placed in arm and before nurse could give discharge instructions.  Patient stopped in hallway and instructed IV must be removed.  IV removed and patient given discharge instruction.

## 2017-05-28 NOTE — ED Notes (Signed)
Patient went to nurse's station and stated that she is having chest pain and asked for Dilaudid. EKG ordered.

## 2017-05-28 NOTE — ED Notes (Signed)
Admission MD at bedside.  

## 2017-05-28 NOTE — Telephone Encounter (Signed)
Patient called to let you know that she is going to the ED. When she went to the bathroom this morning she had a bright red blood clot when she wiped. She called her PCP and they told her to go to the ED. She wanted Dr. Allen Norris to know.

## 2017-05-28 NOTE — ED Triage Notes (Signed)
Per patient's report, patient reports bright red blood from the rectum that is resolving. Patient states she was constipated and self-administered a Fleets enema yesterday with good results. Patient has multiple complaints.

## 2017-06-09 ENCOUNTER — Encounter
Admission: RE | Disposition: A | Discharge status: Home / Self Care | Payer: Self-pay | Source: Ambulatory Visit | Attending: Gastroenterology

## 2017-06-09 ENCOUNTER — Ambulatory Visit
Admission: RE | Admit: 2017-06-09 | Discharge: 2017-06-09 | Disposition: A | Discharge status: Home / Self Care | Payer: Medicare Other | Source: Ambulatory Visit | Attending: Gastroenterology | Admitting: Gastroenterology

## 2017-06-09 ENCOUNTER — Ambulatory Visit: Payer: Medicare Other | Admitting: Anesthesiology

## 2017-06-09 DIAGNOSIS — Z7902 Long term (current) use of antithrombotics/antiplatelets: Secondary | ICD-10-CM | POA: Diagnosis not present

## 2017-06-09 DIAGNOSIS — I739 Peripheral vascular disease, unspecified: Secondary | ICD-10-CM | POA: Insufficient documentation

## 2017-06-09 DIAGNOSIS — F419 Anxiety disorder, unspecified: Secondary | ICD-10-CM | POA: Diagnosis not present

## 2017-06-09 DIAGNOSIS — F1290 Cannabis use, unspecified, uncomplicated: Secondary | ICD-10-CM | POA: Diagnosis not present

## 2017-06-09 DIAGNOSIS — Z79899 Other long term (current) drug therapy: Secondary | ICD-10-CM | POA: Diagnosis not present

## 2017-06-09 DIAGNOSIS — K635 Polyp of colon: Secondary | ICD-10-CM

## 2017-06-09 DIAGNOSIS — I10 Essential (primary) hypertension: Secondary | ICD-10-CM | POA: Diagnosis not present

## 2017-06-09 DIAGNOSIS — F329 Major depressive disorder, single episode, unspecified: Secondary | ICD-10-CM | POA: Insufficient documentation

## 2017-06-09 DIAGNOSIS — Z885 Allergy status to narcotic agent status: Secondary | ICD-10-CM | POA: Diagnosis not present

## 2017-06-09 DIAGNOSIS — I2510 Atherosclerotic heart disease of native coronary artery without angina pectoris: Secondary | ICD-10-CM | POA: Diagnosis not present

## 2017-06-09 DIAGNOSIS — D125 Benign neoplasm of sigmoid colon: Secondary | ICD-10-CM

## 2017-06-09 DIAGNOSIS — Z888 Allergy status to other drugs, medicaments and biological substances status: Secondary | ICD-10-CM | POA: Insufficient documentation

## 2017-06-09 DIAGNOSIS — K219 Gastro-esophageal reflux disease without esophagitis: Secondary | ICD-10-CM | POA: Insufficient documentation

## 2017-06-09 DIAGNOSIS — D124 Benign neoplasm of descending colon: Secondary | ICD-10-CM

## 2017-06-09 DIAGNOSIS — K5730 Diverticulosis of large intestine without perforation or abscess without bleeding: Secondary | ICD-10-CM | POA: Diagnosis not present

## 2017-06-09 DIAGNOSIS — J449 Chronic obstructive pulmonary disease, unspecified: Secondary | ICD-10-CM | POA: Diagnosis not present

## 2017-06-09 DIAGNOSIS — Z8249 Family history of ischemic heart disease and other diseases of the circulatory system: Secondary | ICD-10-CM | POA: Diagnosis not present

## 2017-06-09 DIAGNOSIS — F17210 Nicotine dependence, cigarettes, uncomplicated: Secondary | ICD-10-CM | POA: Insufficient documentation

## 2017-06-09 DIAGNOSIS — D500 Iron deficiency anemia secondary to blood loss (chronic): Secondary | ICD-10-CM | POA: Diagnosis not present

## 2017-06-09 DIAGNOSIS — D509 Iron deficiency anemia, unspecified: Secondary | ICD-10-CM | POA: Insufficient documentation

## 2017-06-09 DIAGNOSIS — I6930 Unspecified sequelae of cerebral infarction: Secondary | ICD-10-CM | POA: Diagnosis not present

## 2017-06-09 DIAGNOSIS — E785 Hyperlipidemia, unspecified: Secondary | ICD-10-CM | POA: Diagnosis not present

## 2017-06-09 DIAGNOSIS — K922 Gastrointestinal hemorrhage, unspecified: Secondary | ICD-10-CM

## 2017-06-09 HISTORY — DX: Gastro-esophageal reflux disease without esophagitis: K21.9

## 2017-06-09 HISTORY — PX: COLONOSCOPY WITH PROPOFOL: SHX5780

## 2017-06-09 SURGERY — COLONOSCOPY WITH PROPOFOL
Anesthesia: General

## 2017-06-09 MED ORDER — LIDOCAINE HCL (CARDIAC) 20 MG/ML IV SOLN
INTRAVENOUS | Status: DC | PRN
  Administered 2017-06-09: 60 mg via INTRAVENOUS

## 2017-06-09 MED ORDER — PROPOFOL 10 MG/ML IV BOLUS
INTRAVENOUS | Status: AC
  Filled 2017-06-09: qty 20

## 2017-06-09 MED ORDER — PROPOFOL 500 MG/50ML IV EMUL
INTRAVENOUS | Status: DC | PRN
  Administered 2017-06-09: 130 ug/kg/min via INTRAVENOUS

## 2017-06-09 MED ORDER — SODIUM CHLORIDE 0.9 % IV SOLN
INTRAVENOUS | Status: DC
  Administered 2017-06-09 (×3): via INTRAVENOUS

## 2017-06-09 MED ORDER — PROPOFOL 500 MG/50ML IV EMUL
INTRAVENOUS | Status: AC
  Filled 2017-06-09: qty 50

## 2017-06-09 MED ORDER — GLYCOPYRROLATE 0.2 MG/ML IJ SOLN
INTRAMUSCULAR | Status: AC
  Filled 2017-06-09: qty 1

## 2017-06-09 MED ORDER — LIDOCAINE HCL (PF) 2 % IJ SOLN
INTRAMUSCULAR | Status: AC
  Filled 2017-06-09: qty 2

## 2017-06-09 MED ORDER — PROPOFOL 10 MG/ML IV BOLUS
INTRAVENOUS | Status: DC | PRN
  Administered 2017-06-09: 50 mg via INTRAVENOUS

## 2017-06-09 NOTE — Anesthesia Postprocedure Evaluation (Signed)
Anesthesia Post Note  Patient: Traci Duran  Procedure(s) Performed: Procedure(s) (LRB): COLONOSCOPY WITH PROPOFOL (N/A)  Patient location during evaluation: PACU Anesthesia Type: General Level of consciousness: awake and alert and oriented Pain management: pain level controlled Vital Signs Assessment: post-procedure vital signs reviewed and stable Respiratory status: spontaneous breathing Cardiovascular status: blood pressure returned to baseline Anesthetic complications: no     Last Vitals:  Vitals:   06/09/17 0830 06/09/17 0840  BP: (!) 100/54 (!) 164/81  Pulse: 65 65  Resp: 17 17  Temp:      Last Pain:  Vitals:   06/09/17 0810  TempSrc: Tympanic  PainSc:                  Zahava Quant

## 2017-06-09 NOTE — Transfer of Care (Signed)
Immediate Anesthesia Transfer of Care Note  Patient: Traci Duran  Procedure(s) Performed: Procedure(s): COLONOSCOPY WITH PROPOFOL (N/A)  Patient Location: Endoscopy Unit  Anesthesia Type:General  Level of Consciousness: drowsy and patient cooperative  Airway & Oxygen Therapy: Patient Spontanous Breathing and Patient connected to nasal cannula oxygen  Post-op Assessment: Report given to RN and Post -op Vital signs reviewed and stable  Post vital signs: Reviewed and stable  Last Vitals:  Vitals:   06/09/17 0709  BP: (!) 149/79  Pulse: 71  Resp: 20  Temp: (!) 36 C    Last Pain:  Vitals:   06/09/17 0709  TempSrc: Tympanic  PainSc: 0-No pain         Complications: No apparent anesthesia complications

## 2017-06-09 NOTE — Op Note (Signed)
[facility] Gastroenterology Patient Name: Traci Duran Procedure Date: 06/09/2017 7:25 AM MRN: [id number] Account #: [id number] Date of Birth: 25-Jun-1956 Admit Type: Outpatient Age: 61 Room: [facility] ENDO ROOM 4 Gender: Female Note Status: Finalized Procedure:            Colonoscopy Indications:          Iron deficiency anemia Providers:            Midge Minium MD, MD Referring MD:         No Local Md, MD (Referring MD) Medicines:            Propofol per Anesthesia Complications:        No immediate complications. Procedure:            Pre-Anesthesia Assessment:                       - Prior to the procedure, a History and Physical was                        performed, and patient medications and allergies were                        reviewed. The patient's tolerance of previous                        anesthesia was also reviewed. The risks and benefits of                        the procedure and the sedation options and risks were                        discussed with the patient. All questions were                        answered, and informed consent was obtained. Prior                        Anticoagulants: The patient has taken no previous                        anticoagulant or antiplatelet agents. ASA Grade                        Assessment: II - A patient with mild systemic disease.                        After reviewing the risks and benefits, the patient was                        deemed in satisfactory condition to undergo the                        procedure.                       After obtaining informed consent, the colonoscope was                        passed under direct vision. Throughout the procedure,  the patient's blood pressure, pulse, and oxygen                        saturations were monitored continuously. The                        Colonoscope was introduced through the anus and                        advanced to the  the cecum, identified by appendiceal                        orifice and ileocecal valve. The colonoscopy was                        performed without difficulty. The patient tolerated the                        procedure well. The quality of the bowel preparation                        was excellent. Findings:      The perianal and digital rectal examinations were normal.      A 8 mm polyp was found in the descending colon. The polyp was sessile.       The polyp was removed with a hot snare. Resection and retrieval were       complete.      Four sessile polyps were found in the sigmoid colon. The polyps were 3       to 6 mm in size. These polyps were removed with a cold snare. Resection       and retrieval were complete.      Multiple small-mouthed diverticula were found in the sigmoid colon. Impression:           - One 8 mm polyp in the descending colon, removed with                        a hot snare. Resected and retrieved.                       - Four 3 to 6 mm polyps in the sigmoid colon, removed                        with a cold snare. Resected and retrieved.                       - Diverticulosis in the sigmoid colon. Recommendation:       - Discharge patient to home.                       - Resume previous diet.                       - Continue present medications.                       - Await pathology results.                       - Repeat colonoscopy in 5 years if polyp adenoma and 10  years if hyperplastic Procedure Code(s):    --- Professional ---                       [phone], Colonoscopy, flexible; with removal of tumor(s),                        polyp(s), or other lesion(s) by snare technique Diagnosis Code(s):    --- Professional ---                       D50.9, Iron deficiency anemia, unspecified                       D12.4, Benign neoplasm of descending colon                       D12.5, Benign neoplasm of sigmoid colon CPT copyright 2016  American Medical Association. All rights reserved. The codes documented in this report are preliminary and upon coder review may  be revised to meet current compliance requirements. Midge Minium MD, MD 06/09/2017 8:12:44 AM This report has been signed electronically. Number of Addenda: 0 Note Initiated On: 06/09/2017 7:25 AM Scope Withdrawal Time: 0 hours 9 minutes 36 seconds  Total Procedure Duration: 0 hours 12 minutes 30 seconds       [facility]

## 2017-06-09 NOTE — Anesthesia Preprocedure Evaluation (Signed)
Anesthesia Evaluation  Patient identified by MRN, date of birth, ID band Patient awake    Reviewed: Allergy & Precautions, H&P , NPO status , Patient's Chart, lab work & pertinent test results  Airway Mallampati: III  TM Distance: >3 FB Neck ROM: full    Dental  (+) Poor Dentition, Chipped, Caps   Pulmonary neg pulmonary ROS, COPD, Current Smoker,           Cardiovascular Exercise Tolerance: Poor hypertension, + angina + CAD, + Peripheral Vascular Disease and + DOE       Neuro/Psych PSYCHIATRIC DISORDERS CVA, Residual Symptoms    GI/Hepatic Neg liver ROS, GERD  Medicated and Controlled,  Endo/Other  negative endocrine ROS  Renal/GU Renal disease  negative genitourinary   Musculoskeletal   Abdominal   Peds  Hematology negative hematology ROS (+)   Anesthesia Other Findings Past Medical History: No date: Anxiety No date: COPD (chronic obstructive pulmonary disease) (* No date: Coronary artery disease No date: Depression No date: GERD (gastroesophageal reflux disease) No date: Hemorrhoids No date: Hyperlipidemia No date: Hypertension No date: PAD (peripheral artery disease) ([place])  Past Surgical History: No date: APPENDECTOMY 04/09/2017: ESOPHAGOGASTRODUODENOSCOPY (EGD) WITH PROPOFOL N/A     Comment: Procedure: ESOPHAGOGASTRODUODENOSCOPY (EGD)               WITH PROPOFOL;  Surgeon: Lucilla Lame, MD;                Location: [facility] ENDOSCOPY;  Service: Endoscopy;               Laterality: N/A; No date: HIP FRACTURE SURGERY  BMI    Body Mass Index:  21.77 kg/m      Reproductive/Obstetrics negative OB ROS                             Anesthesia Physical Anesthesia Plan  ASA: IV  Anesthesia Plan: General   Post-op Pain Management:    Induction: Intravenous  PONV Risk Score and Plan: 3 and Ondansetron, Dexamethasone, Propofol and Midazolam  Airway Management Planned: Natural  Airway and Nasal Cannula  Additional Equipment:   Intra-op Plan:   Post-operative Plan:   Informed Consent: I have reviewed the patients History and Physical, chart, labs and discussed the procedure including the risks, benefits and alternatives for the proposed anesthesia with the patient or authorized representative who has indicated his/her understanding and acceptance.   Dental Advisory Given  Plan Discussed with: Anesthesiologist, CRNA and Surgeon  Anesthesia Plan Comments: (Patient informed that they are higher risk for complications from anesthesia during this procedure due to their medical history.  Patient voiced understanding.  Patient consented for risks of anesthesia including but not limited to:  - adverse reactions to medications - damage to teeth, lips or other oral mucosa - sore throat or hoarseness - Damage to heart, brain, lungs or loss of life  Patient voiced understanding.)        Anesthesia Quick Evaluation

## 2017-06-09 NOTE — Anesthesia Post-op Follow-up Note (Cosign Needed)
Anesthesia QCDR form completed.        

## 2017-06-09 NOTE — H&P (Signed)
Lucilla Lame, MD [address] [place], [place] [address] Phone:[phone] Fax : [phone]  Primary Care Physician:  [facility] Primary Gastroenterologist:  Dr. Allen Norris  Pre-Procedure History & Physical: HPI:  Traci Duran is a 61 y.o. female is here for an colonoscopy.   Past Medical History:  Diagnosis Date  . Anxiety   . COPD (chronic obstructive pulmonary disease) ([place])   . Coronary artery disease   . Depression   . GERD (gastroesophageal reflux disease)   . Hemorrhoids   . Hyperlipidemia   . Hypertension   . PAD (peripheral artery disease) ([place])     Past Surgical History:  Procedure Laterality Date  . APPENDECTOMY    . ESOPHAGOGASTRODUODENOSCOPY (EGD) WITH PROPOFOL N/A 04/09/2017   Procedure: ESOPHAGOGASTRODUODENOSCOPY (EGD) WITH PROPOFOL;  Surgeon: Lucilla Lame, MD;  Location: [facility] ENDOSCOPY;  Service: Endoscopy;  Laterality: N/A;  . HIP FRACTURE SURGERY      Prior to Admission medications   Medication Sig Start Date End Date Taking? Authorizing Provider  acetaminophen (TYLENOL) 325 MG tablet Take 650 mg by mouth every 6 (six) hours as needed.   Yes [provider]  albuterol (VENTOLIN HFA) 108 (90 BASE) MCG/ACT inhaler Inhale 2 puffs into the lungs every 6 (six) hours as needed. For shortness of breath    Yes [provider]  ALPRAZolam (XANAX) 1 MG tablet Take 1 mg by mouth 3 (three) times daily as needed. For anxiety    Yes [provider]  atorvastatin (LIPITOR) 80 MG tablet Take 80 mg by mouth daily.   Yes [provider]  busPIRone (BUSPAR) 10 MG tablet Take 10 mg by mouth daily.  04/30/17  Yes [provider]  carvedilol (COREG) 6.25 MG tablet Take 6.25 mg by mouth 2 (two) times daily. 05/28/17  Yes [provider]  citalopram (CELEXA) 40 MG tablet Take 40 mg by mouth daily.   Yes [provider]  DULoxetine (CYMBALTA) 30 MG capsule Take 30 mg by mouth daily. 05/28/17   Yes [provider]  FLOVENT HFA 110 MCG/ACT inhaler Inhale 1 puff into the lungs 2 (two) times daily.  04/06/17  Yes [provider]  gabapentin (NEURONTIN) 300 MG capsule Take 1 capsule by mouth 2 (two) times daily.  04/06/17  Yes [provider]  lisinopril (PRINIVIL,ZESTRIL) 20 MG tablet Take 20 mg by mouth daily.     Yes [provider]  nitroGLYCERIN (NITROSTAT) 0.4 MG SL tablet Place under the tongue. 01/24/16  Yes [provider]  pantoprazole (PROTONIX) 40 MG tablet Take 1 tablet (40 mg total) by mouth daily. Patient taking differently: Take 20 mg by mouth daily.  04/09/17  Yes Mody, Ulice Bold, MD  potassium chloride (K-DUR) 10 MEQ tablet Take 1 tablet (10 mEq total) by mouth daily. 05/28/17  Yes Earleen Newport, MD  clopidogrel (PLAVIX) 75 MG tablet Take 75 mg by mouth daily.    [provider]  Multiple Vitamin (MULTIVITAMIN) tablet Take 1 tablet by mouth daily.    [provider]  naproxen (NAPROSYN) 500 MG tablet Take 500 mg by mouth 2 (two) times daily. 05/26/17   [provider]  nicotine (NICODERM CQ - DOSED IN MG/24 HOURS) 21 mg/24hr patch Place 1 patch (21 mg total) onto the skin daily. Patient not taking: Reported on 05/28/2017 04/10/17   Bettey Costa, MD  Vitamin D, Ergocalciferol, (DRISDOL) 50000 units CAPS capsule TAKE 1 CAPSULE  50,000 UNITS TOTAL  BY MOUTH  ONCE A WEEK 05/01/17   [provider]    Allergies as of 05/11/2017 - Review Complete 05/11/2017  Allergen Reaction Noted  . Diphenhydramine hcl Shortness Of Breath   . Gabapentin Other (See Comments) 04/24/2016  . Pregabalin Other (See Comments) 04/24/2016  . Morphine and related Itching 09/12/2016  . Benadryl [diphenhydramine hcl] Rash 10/14/2011  . Chantix [varenicline] Palpitations 09/06/2012  . Niacin Rash   . Trazodone Palpitations 05/23/2015    Family History  Problem Relation Age of Onset  . Heart failure Mother   . Heart attack  Mother        mother died at 68 of an MI  . Heart attack Father        father died at 58 of an MI  . Heart attack Brother        Brother had an MI in his 91s    Social History   Social History  . Marital status: Widowed    Spouse name: N/A  . Number of children: N/A  . Years of education: N/A   Occupational History  . Not on file.   Social History Main Topics  . Smoking status: Current Some Day Smoker    Packs/day: 0.50    Years: 20.00    Types: Cigarettes  . Smokeless tobacco: Never Used  . Alcohol use No  . Drug use: Yes    Types: Marijuana     Comment: 1 week ago Marijuana.   . Sexual activity: Not Currently   Other Topics Concern  . Not on file   Social History Narrative  . No narrative on file    Review of Systems: See HPI, otherwise negative ROS  Physical Exam: BP (!) 149/79   Pulse 71   Temp (!) 96.8 F (36 C) (Tympanic)   Resp 20   Ht 5\' 7"  (1.702 m)   Wt 139 lb (63 kg)   SpO2 99%   BMI 21.77 kg/m  General:   Alert,  pleasant and cooperative in NAD Head:  Normocephalic and atraumatic. Neck:  Supple; no masses or thyromegaly. Lungs:  Clear throughout to auscultation.    Heart:  Regular rate and rhythm. Abdomen:  Soft, nontender and nondistended. Normal bowel sounds, without guarding, and without rebound.   Neurologic:  Alert and  oriented x4;  grossly normal neurologically.  Impression/Plan: Traci Duran is here for an colonoscopy to be performed for IDA  Risks, benefits, limitations, and alternatives regarding  colonoscopy have been reviewed with the patient.  Questions have been answered.  All parties agreeable.   Lucilla Lame, MD  06/09/2017, 7:45 AM

## 2017-06-11 ENCOUNTER — Encounter: Payer: Self-pay | Admitting: Gastroenterology

## 2017-06-11 LAB — SURGICAL PATHOLOGY

## 2017-06-15 ENCOUNTER — Encounter: Payer: Self-pay | Admitting: Gastroenterology

## 2017-07-28 ENCOUNTER — Inpatient Hospital Stay
Admission: EM | Admit: 2017-07-28 | Discharge: 2017-07-30 | DRG: 378 | Disposition: A | Discharge status: Home / Self Care | Payer: Medicare Other | Attending: Internal Medicine | Admitting: Internal Medicine

## 2017-07-28 ENCOUNTER — Inpatient Hospital Stay (HOSPITAL_COMMUNITY)
Admit: 2017-07-28 | Discharge: 2017-07-28 | Disposition: A | Discharge status: Home / Self Care | Payer: Medicare Other | Attending: Internal Medicine | Admitting: Internal Medicine

## 2017-07-28 ENCOUNTER — Encounter: Payer: Self-pay | Admitting: *Deleted

## 2017-07-28 ENCOUNTER — Emergency Department: Payer: Medicare Other

## 2017-07-28 DIAGNOSIS — Z7902 Long term (current) use of antithrombotics/antiplatelets: Secondary | ICD-10-CM | POA: Diagnosis not present

## 2017-07-28 DIAGNOSIS — K921 Melena: Secondary | ICD-10-CM | POA: Diagnosis present

## 2017-07-28 DIAGNOSIS — Z8249 Family history of ischemic heart disease and other diseases of the circulatory system: Secondary | ICD-10-CM

## 2017-07-28 DIAGNOSIS — F17210 Nicotine dependence, cigarettes, uncomplicated: Secondary | ICD-10-CM | POA: Diagnosis present

## 2017-07-28 DIAGNOSIS — T454X5A Adverse effect of iron and its compounds, initial encounter: Secondary | ICD-10-CM | POA: Diagnosis present

## 2017-07-28 DIAGNOSIS — I739 Peripheral vascular disease, unspecified: Secondary | ICD-10-CM | POA: Diagnosis present

## 2017-07-28 DIAGNOSIS — I110 Hypertensive heart disease with heart failure: Secondary | ICD-10-CM | POA: Diagnosis present

## 2017-07-28 DIAGNOSIS — K2971 Gastritis, unspecified, with bleeding: Secondary | ICD-10-CM | POA: Diagnosis present

## 2017-07-28 DIAGNOSIS — Z79899 Other long term (current) drug therapy: Secondary | ICD-10-CM

## 2017-07-28 DIAGNOSIS — I252 Old myocardial infarction: Secondary | ICD-10-CM | POA: Diagnosis not present

## 2017-07-28 DIAGNOSIS — Z7982 Long term (current) use of aspirin: Secondary | ICD-10-CM | POA: Diagnosis not present

## 2017-07-28 DIAGNOSIS — F419 Anxiety disorder, unspecified: Secondary | ICD-10-CM | POA: Diagnosis present

## 2017-07-28 DIAGNOSIS — K922 Gastrointestinal hemorrhage, unspecified: Secondary | ICD-10-CM | POA: Diagnosis present

## 2017-07-28 DIAGNOSIS — J449 Chronic obstructive pulmonary disease, unspecified: Secondary | ICD-10-CM | POA: Diagnosis present

## 2017-07-28 DIAGNOSIS — E876 Hypokalemia: Secondary | ICD-10-CM | POA: Diagnosis present

## 2017-07-28 DIAGNOSIS — E538 Deficiency of other specified B group vitamins: Secondary | ICD-10-CM | POA: Diagnosis present

## 2017-07-28 DIAGNOSIS — Z8673 Personal history of transient ischemic attack (TIA), and cerebral infarction without residual deficits: Secondary | ICD-10-CM

## 2017-07-28 DIAGNOSIS — I248 Other forms of acute ischemic heart disease: Secondary | ICD-10-CM | POA: Diagnosis not present

## 2017-07-28 DIAGNOSIS — I5032 Chronic diastolic (congestive) heart failure: Secondary | ICD-10-CM | POA: Diagnosis present

## 2017-07-28 DIAGNOSIS — R079 Chest pain, unspecified: Secondary | ICD-10-CM | POA: Diagnosis present

## 2017-07-28 DIAGNOSIS — E785 Hyperlipidemia, unspecified: Secondary | ICD-10-CM | POA: Diagnosis present

## 2017-07-28 DIAGNOSIS — I361 Nonrheumatic tricuspid (valve) insufficiency: Secondary | ICD-10-CM

## 2017-07-28 DIAGNOSIS — K5903 Drug induced constipation: Secondary | ICD-10-CM | POA: Diagnosis present

## 2017-07-28 DIAGNOSIS — I2510 Atherosclerotic heart disease of native coronary artery without angina pectoris: Secondary | ICD-10-CM | POA: Diagnosis present

## 2017-07-28 DIAGNOSIS — K219 Gastro-esophageal reflux disease without esophagitis: Secondary | ICD-10-CM | POA: Diagnosis present

## 2017-07-28 DIAGNOSIS — K449 Diaphragmatic hernia without obstruction or gangrene: Secondary | ICD-10-CM | POA: Diagnosis present

## 2017-07-28 DIAGNOSIS — Z888 Allergy status to other drugs, medicaments and biological substances status: Secondary | ICD-10-CM | POA: Diagnosis not present

## 2017-07-28 DIAGNOSIS — I2489 Other forms of acute ischemic heart disease: Secondary | ICD-10-CM

## 2017-07-28 DIAGNOSIS — J432 Centrilobular emphysema: Secondary | ICD-10-CM | POA: Diagnosis not present

## 2017-07-28 DIAGNOSIS — I10 Essential (primary) hypertension: Secondary | ICD-10-CM | POA: Diagnosis present

## 2017-07-28 DIAGNOSIS — D500 Iron deficiency anemia secondary to blood loss (chronic): Secondary | ICD-10-CM | POA: Diagnosis not present

## 2017-07-28 DIAGNOSIS — R0789 Other chest pain: Secondary | ICD-10-CM | POA: Diagnosis present

## 2017-07-28 DIAGNOSIS — D62 Acute posthemorrhagic anemia: Secondary | ICD-10-CM | POA: Diagnosis present

## 2017-07-28 DIAGNOSIS — I214 Non-ST elevation (NSTEMI) myocardial infarction: Secondary | ICD-10-CM

## 2017-07-28 HISTORY — DX: Gastritis, unspecified, without bleeding: K29.70

## 2017-07-28 LAB — CBC
HCT: 19.9 % — ABNORMAL LOW (ref 35.0–47.0)
Hemoglobin: 5.8 g/dL — ABNORMAL LOW (ref 12.0–16.0)
MCH: 17.7 pg — ABNORMAL LOW (ref 26.0–34.0)
MCHC: 29.3 g/dL — ABNORMAL LOW (ref 32.0–36.0)
MCV: 60.4 fL — AB (ref 80.0–100.0)
PLATELETS: 292 10*3/uL (ref 150–440)
RBC: 3.29 MIL/uL — ABNORMAL LOW (ref 3.80–5.20)
RDW: 19.1 % — AB (ref 11.5–14.5)
WBC: 8.2 10*3/uL (ref 3.6–11.0)

## 2017-07-28 LAB — MAGNESIUM: MAGNESIUM: 1.8 mg/dL (ref 1.7–2.4)

## 2017-07-28 LAB — BASIC METABOLIC PANEL
Anion gap: 10 (ref 5–15)
BUN: 12 mg/dL (ref 6–20)
CALCIUM: 8.8 mg/dL — AB (ref 8.9–10.3)
CHLORIDE: 105 mmol/L (ref 101–111)
CO2: 24 mmol/L (ref 22–32)
Creatinine, Ser: 0.67 mg/dL (ref 0.44–1.00)
GFR calc non Af Amer: >60 mL/min (ref >60)
Glucose, Bld: 100 mg/dL — ABNORMAL HIGH (ref 65–99)
Potassium: 2.7 mmol/L — CL (ref 3.5–5.1)
SODIUM: 139 mmol/L (ref 135–145)

## 2017-07-28 LAB — TROPONIN I
TROPONIN I: 1.08 ng/mL — AB (ref <0.03)
TROPONIN I: 1.03 ng/mL — AB (ref <0.03)
Troponin I: 0.71 ng/mL (ref <0.03)

## 2017-07-28 LAB — IRON AND TIBC
Iron: 12 ug/dL — ABNORMAL LOW (ref 28–170)
Saturation Ratios: 3 % — ABNORMAL LOW (ref 10.4–31.8)
TIBC: 364 ug/dL (ref 250–450)
UIBC: 352 ug/dL

## 2017-07-28 LAB — FERRITIN: FERRITIN: 4 ng/mL — AB (ref 11–307)

## 2017-07-28 LAB — PROTIME-INR
INR: 1.09
Prothrombin Time: 14.1 seconds (ref 11.4–15.2)

## 2017-07-28 LAB — PREPARE RBC (CROSSMATCH)

## 2017-07-28 LAB — POTASSIUM: POTASSIUM: 3.5 mmol/L (ref 3.5–5.1)

## 2017-07-28 LAB — APTT: APTT: 34 s (ref 24–36)

## 2017-07-28 MED ORDER — ACETAMINOPHEN 650 MG RE SUPP: 650.0000 mg | Freq: Four times a day (QID) | RECTAL | Status: DC | PRN

## 2017-07-28 MED ORDER — POTASSIUM CHLORIDE CRYS ER 10 MEQ PO TBCR: 20.0000 meq | EXTENDED_RELEASE_TABLET | Freq: Every day | ORAL | Status: DC

## 2017-07-28 MED ORDER — SODIUM CHLORIDE 0.9 % IV SOLN
10.0000 mL/h | Freq: Once | INTRAVENOUS | Status: AC
  Administered 2017-07-28: 10 mL/h via INTRAVENOUS

## 2017-07-28 MED ORDER — ATORVASTATIN CALCIUM 20 MG PO TABS
80.0000 mg | ORAL_TABLET | Freq: Every day | ORAL | Status: DC
  Administered 2017-07-28 – 2017-07-30 (×3): 80 mg via ORAL
  Filled 2017-07-28 (×3): qty 4

## 2017-07-28 MED ORDER — POTASSIUM CHLORIDE CRYS ER 20 MEQ PO TBCR
40.0000 meq | EXTENDED_RELEASE_TABLET | Freq: Once | ORAL | Status: AC
  Administered 2017-07-28: 40 meq via ORAL

## 2017-07-28 MED ORDER — NITROGLYCERIN 0.4 MG SL SUBL: 0.4000 mg | SUBLINGUAL_TABLET | SUBLINGUAL | Status: DC | PRN

## 2017-07-28 MED ORDER — POTASSIUM CHLORIDE 10 MEQ/100ML IV SOLN
10.0000 meq | INTRAVENOUS | Status: AC
  Administered 2017-07-28 (×2): 10 meq via INTRAVENOUS
  Filled 2017-07-28 (×3): qty 100

## 2017-07-28 MED ORDER — TRAMADOL HCL 50 MG PO TABS
25.0000 mg | ORAL_TABLET | Freq: Once | ORAL | Status: AC
  Administered 2017-07-28: 25 mg via ORAL
  Filled 2017-07-28: qty 1

## 2017-07-28 MED ORDER — SODIUM CHLORIDE 0.9 % IV SOLN
8.0000 mg/h | INTRAVENOUS | Status: DC
  Administered 2017-07-28 – 2017-07-30 (×3): 8 mg/h via INTRAVENOUS
  Filled 2017-07-28 (×4): qty 80

## 2017-07-28 MED ORDER — ACETAMINOPHEN 325 MG PO TABS
650.0000 mg | ORAL_TABLET | Freq: Once | ORAL | Status: AC
  Administered 2017-07-28: 650 mg via ORAL

## 2017-07-28 MED ORDER — HEPARIN (PORCINE) IN NACL 100-0.45 UNIT/ML-% IJ SOLN: 10.0000 [IU]/kg/h | Freq: Once | INTRAMUSCULAR | Status: DC

## 2017-07-28 MED ORDER — SODIUM CHLORIDE 0.9% FLUSH
3.0000 mL | Freq: Two times a day (BID) | INTRAVENOUS | Status: DC
  Administered 2017-07-28 – 2017-07-29 (×3): 3 mL via INTRAVENOUS

## 2017-07-28 MED ORDER — NITROGLYCERIN 2 % TD OINT
TOPICAL_OINTMENT | TRANSDERMAL | Status: AC
  Administered 2017-07-28: 0.5 [in_us] via TOPICAL
  Filled 2017-07-28: qty 1

## 2017-07-28 MED ORDER — CARVEDILOL 6.25 MG PO TABS
6.2500 mg | ORAL_TABLET | Freq: Two times a day (BID) | ORAL | Status: DC
  Administered 2017-07-28 – 2017-07-30 (×4): 6.25 mg via ORAL
  Filled 2017-07-28 (×5): qty 1

## 2017-07-28 MED ORDER — ALPRAZOLAM 1 MG PO TABS
1.0000 mg | ORAL_TABLET | Freq: Three times a day (TID) | ORAL | Status: DC | PRN
  Administered 2017-07-28 – 2017-07-29 (×3): 1 mg via ORAL
  Filled 2017-07-28 (×3): qty 1

## 2017-07-28 MED ORDER — PANTOPRAZOLE SODIUM 40 MG IV SOLR: 40.0000 mg | Freq: Two times a day (BID) | INTRAVENOUS | Status: DC

## 2017-07-28 MED ORDER — MAGNESIUM SULFATE 2 GM/50ML IV SOLN
2.0000 g | Freq: Once | INTRAVENOUS | Status: AC
  Administered 2017-07-28: 2 g via INTRAVENOUS
  Filled 2017-07-28: qty 50

## 2017-07-28 MED ORDER — ACETAMINOPHEN 325 MG PO TABS: 650.0000 mg | ORAL_TABLET | Freq: Four times a day (QID) | ORAL | Status: DC | PRN

## 2017-07-28 MED ORDER — POTASSIUM CHLORIDE IN NACL 20-0.9 MEQ/L-% IV SOLN
INTRAVENOUS | Status: DC
  Administered 2017-07-28: 16:00:00 via INTRAVENOUS
  Filled 2017-07-28 (×2): qty 1000

## 2017-07-28 MED ORDER — DULOXETINE HCL 30 MG PO CPEP
30.0000 mg | ORAL_CAPSULE | Freq: Every day | ORAL | Status: DC
  Administered 2017-07-29 – 2017-07-30 (×2): 30 mg via ORAL
  Filled 2017-07-28 (×2): qty 1

## 2017-07-28 MED ORDER — HEPARIN SODIUM (PORCINE) 5000 UNIT/ML IJ SOLN
60.0000 [IU]/kg | Freq: Once | INTRAMUSCULAR | Status: DC
  Filled 2017-07-28: qty 1

## 2017-07-28 MED ORDER — ASPIRIN 81 MG PO CHEW
243.0000 mg | CHEWABLE_TABLET | Freq: Once | ORAL | Status: AC
  Administered 2017-07-28: 243 mg via ORAL

## 2017-07-28 MED ORDER — BUSPIRONE HCL 5 MG PO TABS
10.0000 mg | ORAL_TABLET | Freq: Every day | ORAL | Status: DC
  Administered 2017-07-29 – 2017-07-30 (×2): 10 mg via ORAL
  Filled 2017-07-28 (×2): qty 2

## 2017-07-28 MED ORDER — ALBUTEROL SULFATE (2.5 MG/3ML) 0.083% IN NEBU: 2.5000 mg | INHALATION_SOLUTION | RESPIRATORY_TRACT | Status: DC | PRN

## 2017-07-28 MED ORDER — GABAPENTIN 300 MG PO CAPS
300.0000 mg | ORAL_CAPSULE | Freq: Four times a day (QID) | ORAL | Status: DC
  Administered 2017-07-28 – 2017-07-30 (×9): 300 mg via ORAL
  Filled 2017-07-28 (×9): qty 1

## 2017-07-28 MED ORDER — ONDANSETRON HCL 4 MG PO TABS
4.0000 mg | ORAL_TABLET | Freq: Four times a day (QID) | ORAL | Status: DC | PRN
  Administered 2017-07-28: 4 mg via ORAL
  Filled 2017-07-28: qty 1

## 2017-07-28 MED ORDER — POTASSIUM CHLORIDE CRYS ER 20 MEQ PO TBCR
EXTENDED_RELEASE_TABLET | ORAL | Status: AC
  Administered 2017-07-28: 40 meq via ORAL
  Filled 2017-07-28: qty 2

## 2017-07-28 MED ORDER — CITALOPRAM HYDROBROMIDE 20 MG PO TABS
40.0000 mg | ORAL_TABLET | Freq: Every day | ORAL | Status: DC
  Administered 2017-07-29 – 2017-07-30 (×2): 40 mg via ORAL
  Filled 2017-07-28 (×2): qty 2

## 2017-07-28 MED ORDER — NITROGLYCERIN 2 % TD OINT
0.5000 [in_us] | TOPICAL_OINTMENT | Freq: Once | TRANSDERMAL | Status: AC
  Administered 2017-07-28: 0.5 [in_us] via TOPICAL

## 2017-07-28 MED ORDER — ONDANSETRON HCL 4 MG/2ML IJ SOLN: 4.0000 mg | Freq: Four times a day (QID) | INTRAMUSCULAR | Status: DC | PRN

## 2017-07-28 MED ORDER — SODIUM CHLORIDE 0.9 % IV SOLN
80.0000 mg | Freq: Once | INTRAVENOUS | Status: DC
  Filled 2017-07-28: qty 80

## 2017-07-28 MED ORDER — POTASSIUM CHLORIDE 20 MEQ/15ML (10%) PO SOLN
40.0000 meq | Freq: Once | ORAL | Status: DC
  Filled 2017-07-28: qty 30

## 2017-07-28 MED ORDER — ACETAMINOPHEN 325 MG PO TABS
ORAL_TABLET | ORAL | Status: AC
  Administered 2017-07-28: 650 mg via ORAL
  Filled 2017-07-28: qty 2

## 2017-07-28 MED ORDER — ASPIRIN 81 MG PO CHEW
CHEWABLE_TABLET | ORAL | Status: AC
  Administered 2017-07-28: 243 mg via ORAL
  Filled 2017-07-28: qty 3

## 2017-07-28 MED ORDER — OXYCODONE HCL 5 MG PO TABS
5.0000 mg | ORAL_TABLET | ORAL | Status: DC | PRN
  Administered 2017-07-28 – 2017-07-30 (×7): 5 mg via ORAL
  Filled 2017-07-28 (×7): qty 1

## 2017-07-28 NOTE — ED Provider Notes (Addendum)
[facility] Emergency Department Provider Note   ____________________________________________   First MD Initiated Contact with Patient 07/28/17 1315     (approximate)  I have reviewed the triage vital signs and the nursing notes.   HISTORY  Chief Complaint Chest Pain    HPI Traci Duran is a 61 y.o. female who reports she's been having chest pain off and on for about a week. She's been taking nitroglycerin formaking her better. Last night she got chest pain and sweating and some shortness of breath did not respond to nitroglycerin which she waited till this morning to come in because she thought it was reflex (reflux). Patient already also has claudication which is been getting worse. The chest pain seems to be have been getting worse as well.   Past Medical History:  Diagnosis Date  . Anxiety   . COPD (chronic obstructive pulmonary disease) ([place])   . Coronary artery disease   . Depression   . GERD (gastroesophageal reflux disease)   . Hemorrhoids   . Hyperlipidemia   . Hypertension   . PAD (peripheral artery disease) [facility])     Patient Active Problem List   Diagnosis Date Noted  . Benign neoplasm of descending colon   . Polyp of sigmoid colon   . Age related osteoporosis 04/16/2017  . Iron deficiency anemia secondary to blood loss (chronic)   . GI bleed 04/06/2017  . Diastolic dysfunction 23/76/2831  . Opioid contract exists 06/27/2016  . Incidental lung nodule 01/18/2016  . 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 Hypovitaminosis D 08/29/2015  . Thyroid lesion 08/29/2015  . Major depressive disorder, single episode, unspecified 08/27/2015  . 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 Gastroesophageal reflux disease 01/26/2015  . Osteopenia 01/26/2015  . Anemia, unspecified 12/27/2014  . History of fall 12/27/2014  . Greater tuberosity of humerus fracture 10/24/2013  . AVF (arteriovenous fistula) ([place]) 11/08/2011  .  Anxiety 10/24/2011  . Cerebrovascular disease 10/24/2011  . Dyslipidemia 10/24/2011  . Hypertension 10/24/2011  . Tobacco abuse 10/24/2011  . Palpitations 10/24/2011  . Renal artery stenosis ([place]) 10/24/2011  . Unstable angina ([place]) 10/14/2011  . Hyperlipidemia   . Chronic obstructive pulmonary disease ([employer])   . Depression   . Peripheral arterial disease ([place])   . Coronary artery disease     Past Surgical History:  Procedure Laterality Date  . APPENDECTOMY    . COLONOSCOPY WITH PROPOFOL N/A 06/09/2017   Procedure: COLONOSCOPY WITH PROPOFOL;  Surgeon: Lucilla Lame, MD;  Location[facility] ENDOSCOPY;  Service: Endoscopy;  Laterality: N/A;  . ESOPHAGOGASTRODUODENOSCOPY (EGD) WITH PROPOFOL N/A 04/09/2017   Procedure: ESOPHAGOGASTRODUODENOSCOPY (EGD) WITH PROPOFOL;  Surgeon: Lucilla Lame, MD;  Location: [facility] ENDOSCOPY;  Service: Endoscopy;  Laterality: N/A;  . HIP FRACTURE SURGERY      Prior to Admission medications   Medication Sig Start Date End Date Taking? Authorizing Provider  acetaminophen (TYLENOL) 325 MG tablet Take 650 mg by mouth every 6 (six) hours as needed.    [provider]  albuterol (VENTOLIN HFA) 108 (90 BASE) MCG/ACT inhaler Inhale 2 puffs into the lungs every 6 (six) hours as needed. For shortness of breath     [provider]  ALPRAZolam (XANAX) 1 MG tablet Take 1 mg by mouth 3 (three) times daily as needed. For anxiety     [provider]  atorvastatin (LIPITOR) 80 MG tablet Take 80 mg by mouth daily.    [provider]  busPIRone (BUSPAR) 10  MG tablet Take 10 mg by mouth daily.  04/30/17   [provider]  carvedilol (COREG) 6.25 MG tablet Take 6.25 mg by mouth 2 (two) times daily. 05/28/17   [provider]  citalopram (CELEXA) 40 MG tablet Take 40 mg by mouth daily.    [provider]  clopidogrel (PLAVIX) 75 MG tablet Take 75 mg by mouth daily.    [provider]  DULoxetine (CYMBALTA) 30 MG capsule  Take 30 mg by mouth daily. 05/28/17   [provider]  FLOVENT HFA 110 MCG/ACT inhaler Inhale 1 puff into the lungs 2 (two) times daily.  04/06/17   [provider]  gabapentin (NEURONTIN) 300 MG capsule Take 1 capsule by mouth 2 (two) times daily.  04/06/17   [provider]  lisinopril (PRINIVIL,ZESTRIL) 20 MG tablet Take 20 mg by mouth daily.      [provider]  Multiple Vitamin (MULTIVITAMIN) tablet Take 1 tablet by mouth daily.    [provider]  naproxen (NAPROSYN) 500 MG tablet Take 500 mg by mouth 2 (two) times daily. 05/26/17   [provider]  nicotine (NICODERM CQ - DOSED IN MG/24 HOURS) 21 mg/24hr patch Place 1 patch (21 mg total) onto the skin daily. Patient not taking: Reported on 05/28/2017 04/10/17   Bettey Costa, MD  nitroGLYCERIN (NITROSTAT) 0.4 MG SL tablet Place under the tongue. 01/24/16   [provider]  pantoprazole (PROTONIX) 40 MG tablet Take 1 tablet (40 mg total) by mouth daily. Patient taking differently: Take 20 mg by mouth daily.  04/09/17   Bettey Costa, MD  potassium chloride (K-DUR) 10 MEQ tablet Take 1 tablet (10 mEq total) by mouth daily. 05/28/17   Earleen Newport, MD  Vitamin D, Ergocalciferol, (DRISDOL) 50000 units CAPS capsule TAKE 1 CAPSULE  50,000 UNITS TOTAL  BY MOUTH ONCE A WEEK 05/01/17   [provider]    Allergies Chantix [varenicline]; Diphenhydramine hcl; Pregabalin; Morphine and related; Benadryl [diphenhydramine hcl]; Niacin; and Trazodone  Family History  Problem Relation Age of Onset  . Heart failure Mother   . Heart attack Mother        mother died at 71 of an MI  . Heart attack Father        father died at 56 of an MI  . Heart attack Brother        Brother had an MI in his 62s    Social History Social History  Substance Use Topics  . Smoking status: Current Some Day Smoker    Packs/day: 0.50    Years: 20.00    Types: Cigarettes  . Smokeless tobacco: Never Used    . Alcohol use No    Review of Systems  Constitutional: No fever/chills Eyes: No visual changes. ENT: No sore throat. CardiovascularSee history of present illness. Respiratory: See history of present illness Gastrointestinal: No abdominal pain.   nausea,  vomiting.  No diarrhea.  No constipation. Genitourinary: Negative for dysuria. Musculoskeletal: Negative for back pain. Skin: Negative for rash. Neurological: Negative for headaches, focal weakness }  ____________________________________________   PHYSICAL EXAM:  VITAL SIGNS: ED Triage Vitals  Enc Vitals Group     BP 07/28/17 1242 129/81     Pulse Rate 07/28/17 1242 85     Resp 07/28/17 1242 16     Temp 07/28/17 1242 97.9 F (36.6 C)     Temp Source 07/28/17 1242 Oral     SpO2 07/28/17 1242 100 %  Weight 07/28/17 1238 135 lb (61.2 kg)     Height 07/28/17 1238 5\' 7"  (1.702 m)     Head Circumference --      Peak Flow --      Pain Score 07/28/17 1237 6     Pain Loc --      Pain Edu? --      Excl. in [place]? --     Constitutional: Alert and oriented. Well appearing and in no acute distress. Eyes: Conjunctivae are normal.  Head: Atraumatic. Nose: No congestion/rhinnorhea. Mouth/Throat: Mucous membranes are moist.  Oropharynx non-erythematous. Neck: No stridor.  Cardiovascular: Normal rate, regular rhythm. Grossly normal heart sounds.  Good peripheral circulation. Respiratory: Normal respiratory effort.  No retractions. Lungs CTAB.Palpation of her chest reproduces at least some of the pain. Gastrointestinal: Soft and nontender. No distention. No abdominal bruits. No CVA tenderness. Musculoskeletal: No lower extremity tenderness nor edema.  No joint effusions. Neurologic:  Normal speech and language. No gross focal neurologic deficits are appreciated. Skin:  Skin is warm, dry and intact. No rash noted. Psychiatric: Mood and affect are normal. Speech and behavior are normal. Rectal: Small amount of stool in rectal vault  no tenderness stool is Hemoccult-positive ____________________________________________   LABS (all labs ordered are listed, but only abnormal results are displayed)  Labs Reviewed  BASIC METABOLIC PANEL - Abnormal; Notable for the following:       Result Value   Potassium 2.7 (*)    Glucose, Bld 100 (*)    Calcium 8.8 (*)    All other components within normal limits  CBC - Abnormal; Notable for the following:    RBC 3.29 (*)    Hemoglobin 5.8 (*)    HCT 19.9 (*)    MCV 60.4 (*)    MCH 17.7 (*)    MCHC 29.3 (*)    RDW 19.1 (*)    All other components within normal limits  TROPONIN I - Abnormal; Notable for the following:    Troponin I 1.08 (*)    All other components within normal limits  APTT  PROTIME-INR   ____________________________________________  EKG  EKG read and interpreted by me shows normal sinus rhythm rate of 88 normal axis there are ST-T wave changes inferiorly and laterally which are new from an EKG done in April of this year ____________________________________________  RADIOLOGY  Dg Chest 2 View  Result Date: 07/28/2017 CLINICAL DATA:  Chest pain for 1 week. EXAM: CHEST  2 VIEW COMPARISON:  Single-view of the chest 04/06/2017. PA and lateral chest 01/16/2017. FINDINGS: The lungs are clear. Heart size is upper normal. No pneumothorax or pleural fluid. No acute bony abnormality. The patient has chronic upper and mid to lower thoracic spine compression fractures, unchanged. IMPRESSION: No acute disease. Electronically Signed   By: Inge Rise M.D.   On: 07/28/2017 13:13    ____________________________________________   PROCEDURES  Procedure(s) performed:  Procedures  Critical Care performed:   ____________________________________________   INITIAL IMPRESSION / ASSESSMENT AND PLAN / ED COURSE  Pertinent labs & imaging results that were available during my care of the patient were reviewed by me and considered in my medical decision making  (see chart for details).        ____________________________________________   FINAL CLINICAL IMPRESSION(S) / ED DIAGNOSES  Final diagnoses:  NSTEMI (non-ST elevated myocardial infarction) ([place])      NEW MEDICATIONS STARTED DURING THIS VISIT:  New Prescriptions   No medications on file     Note:  This document was prepared using Dragon voice recognition software and may include unintentional dictation errors.    Nena Polio, MD 07/28/17 1334    Nena Polio, MD 07/28/17 [phone]

## 2017-07-28 NOTE — Progress Notes (Signed)
Heparin consult entered - discussed with MD. Will hold off on starting drip right now due to hemoglobin 5.8.  Lenis Noon, PharmD 07/28/17 1:43 PM

## 2017-07-28 NOTE — Consult Note (Signed)
[name], MD [address]  [place]  [place], [place] [address]  Main: [phone]  Fax: [phone] Pager: [phone]   Consultation  Referring Provider:     No ref. provider found Primary Care Physician:  [facility] Primary Gastroenterologist:  Dr. Lucilla Lame         Reason for Consultation:     Symptomatic iron deficiency anemia  Date of Admission:  07/28/2017 Date of Consultation:  07/28/2017         HPI:   Traci Duran is a 61 y.o. 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 Reviewing labs from care everywhere shows that her hemoglobin has been chronically low and diagnosed with chronic iron deficiency anemia. Her baseline 12.5-12.9. It was as low as 6.6 in 03/2017 with low MCV. Her ferritin was 9 on 10/02/2016. She reports dark stools past 2weeks, prior to this, her stools were brown. She is on pantoprazole as outpatient. She denies taking NSAIDs. She did take BC powder yesterday for migraine headache that started after taking nitroglycerine. She has been suffering from severe heart burn despite on protonix daily. She had vomitingx2 but denies bloody emesis. She denies being on oral iron and never received IV iron. She denies hemtaochezia   GI Procedures: Workup for IDA. EGD May 2018 showed gastritis. Colonoscopy 06/2017 showed colon polyps and diverticulosis  DIAGNOSIS:  A. COLON POLYP, DESCENDING; HOT SNARE:  - TUBULAR ADENOMA, MULTIPLE FRAGMENTS.  - NEGATIVE FOR HIGH-GRADE DYSPLASIA AND MALIGNANCY.   B. COLON POLYP X 3, SIGMOID; COLD SNARE:  - HYPERPLASTIC POLYPS, 4 FRAGMENTS.  - NEGATIVE FOR DYSPLASIA AND MALIGNANCY.    Past Medical History:  Diagnosis Date  . Anxiety   . COPD (chronic obstructive pulmonary disease) ([facility])   . Coronary artery disease   . Depression   . Gastritis     . GERD (gastroesophageal reflux disease)   . Hemorrhoids   . Hyperlipidemia   . Hypertension   . PAD (peripheral artery disease) ([place])     Past Surgical History:  Procedure Laterality Date  . APPENDECTOMY    . COLONOSCOPY WITH PROPOFOL N/A 06/09/2017   Procedure: COLONOSCOPY WITH PROPOFOL;  Surgeon: Lucilla Lame, MD;  Location: [facility] ENDOSCOPY;  Service: Endoscopy;  Laterality: N/A;  . ESOPHAGOGASTRODUODENOSCOPY (EGD) WITH PROPOFOL N/A 04/09/2017   Procedure: ESOPHAGOGASTRODUODENOSCOPY (EGD) WITH PROPOFOL;  Surgeon: Lucilla Lame, MD;  Location: [facility] ENDOSCOPY;  Service: Endoscopy;  Laterality: N/A;  . HIP FRACTURE SURGERY      Prior to Admission medications   Medication Sig Start Date End Date Taking? Authorizing Provider  ALPRAZolam Duanne Moron) 1 MG tablet Take 1 mg by mouth 3 (three) times daily as needed. For anxiety    Yes [provider]  aspirin EC 81 MG tablet Take 81 mg by mouth daily.   Yes [provider]  atorvastatin (LIPITOR) 80 MG tablet Take 80 mg by mouth daily.   Yes [provider]  busPIRone (BUSPAR) 10 MG tablet Take 10 mg by mouth daily.  04/30/17  Yes [provider]  carvedilol (COREG) 6.25 MG tablet Take 6.25 mg by mouth 2 (two) times daily. 05/28/17  Yes [provider]  citalopram (CELEXA) 40 MG tablet Take 40 mg by mouth daily.   Yes [provider]  clopidogrel (PLAVIX) 75 MG tablet Take 75 mg  by mouth daily.   Yes [provider]  DULoxetine (CYMBALTA) 30 MG capsule Take 30 mg by mouth daily. 05/28/17  Yes [provider]  gabapentin (NEURONTIN) 300 MG capsule Take 1 capsule by mouth 4 (four) times daily.  04/06/17  Yes [provider]  lisinopril (PRINIVIL,ZESTRIL) 20 MG tablet Take 20 mg by mouth daily.     Yes [provider]  nicotine (NICODERM CQ - DOSED IN MG/24 HOURS) 21 mg/24hr patch Place 1 patch (21 mg total) onto the skin daily. 04/10/17  Yes Mody, Ulice Bold, MD  nitroGLYCERIN  (NITROSTAT) 0.4 MG SL tablet Place under the tongue. 01/24/16  Yes [provider]  pantoprazole (PROTONIX) 40 MG tablet Take 1 tablet (40 mg total) by mouth daily. Patient taking differently: Take 20 mg by mouth daily.  04/09/17  Yes Mody, Ulice Bold, MD  potassium chloride (K-DUR) 10 MEQ tablet Take 1 tablet (10 mEq total) by mouth daily. 05/28/17  Yes Earleen Newport, MD  Vitamin D, Ergocalciferol, (DRISDOL) 50000 units CAPS capsule TAKE 1 CAPSULE  50,000 UNITS TOTAL  BY MOUTH ONCE A WEEK 05/01/17  Yes [provider]    Family History  Problem Relation Age of Onset  . Heart failure Mother   . Heart attack Mother        mother died at 62 of an MI  . Heart attack Father        father died at 46 of an MI  . Heart attack Brother        Brother had an MI in his 48s     Social History  Substance Use Topics  . Smoking status: Current Some Day Smoker    Packs/day: 0.50    Years: 20.00    Types: Cigarettes  . Smokeless tobacco: Never Used  . Alcohol use No    Allergies as of 07/28/2017 - Review Complete 07/28/2017  Allergen Reaction Noted  . Chantix [varenicline] Hives, Shortness Of Breath, and Palpitations 09/06/2012  . Diphenhydramine hcl Shortness Of Breath   . Pregabalin Other (See Comments) 04/24/2016  . Benadryl [diphenhydramine hcl] Rash 10/14/2011  . Niacin Rash   . Trazodone Palpitations 05/23/2015    Review of Systems:    All systems reviewed and negative except where noted in HPI.   Physical Exam:  Vital signs in last 24 hours: Temp:  [97.6 F (36.4 C)-98.4 F (36.9 C)] 98.4 F (36.9 C) (08/21 2049) Pulse Rate:  [77-86] 85 (08/21 2049) Resp:  [13-21] 13 (08/21 2049) BP: (119-150)/(71-109) 126/80 (08/21 2049) SpO2:  [93 %-100 %] 93 % (08/21 2049) Weight:  [61.2 kg (135 lb)-64.7 kg (142 lb 9.6 oz)] 64.7 kg (142 lb 9.6 oz) (08/21 1515) Last BM Date: 07/27/17 General:   Pleasant, cooperative in NAD Head:  Normocephalic and atraumatic. Eyes:   No  icterus.   Conjunctiva pink. PERRLA. Ears:  Normal auditory acuity. Neck:  Supple; no masses or thyroidomegaly Lungs: Respirations even and unlabored. Lungs clear to auscultation bilaterally.   No wheezes, crackles, or rhonchi.  Heart:  Regular rate and rhythm;  Without murmur, clicks, rubs or gallops Abdomen:  Soft, nondistended, nontender. Normal bowel sounds. No appreciable masses or hepatomegaly.  No rebound or guarding.  Rectal:  Not performed. Msk:  Symmetrical without gross deformities.  Strength weak   Extremities:  Without edema, cyanosis or clubbing. Neurologic:  Alert and oriented x3;  grossly normal neurologically. Skin:  Intact without significant lesions or rashes. Cervical Nodes:  No significant cervical adenopathy.  Psych:  Alert and cooperative. Normal affect.  LAB RESULTS:  Recent Labs  07/28/17 1239  WBC 8.2  HGB 5.8*  HCT 19.9*  PLT 292   BMET  Recent Labs  07/28/17 1239 07/28/17 1920  NA 139  --   K 2.7* 3.5  CL 105  --   CO2 24  --   GLUCOSE 100*  --   BUN 12  --   CREATININE 0.67  --   CALCIUM 8.8*  --    LFT No results for input(s): PROT, ALBUMIN, AST, ALT, ALKPHOS, BILITOT, BILIDIR, IBILI in the last 72 hours. PT/INR  Recent Labs  07/28/17 1347  LABPROT 14.1  INR 1.09    STUDIES: Dg Chest 2 View  Result Date: 07/28/2017 CLINICAL DATA:  Chest pain for 1 week. EXAM: CHEST  2 VIEW COMPARISON:  Single-view of the chest 04/06/2017. PA and lateral chest 01/16/2017. FINDINGS: The lungs are clear. Heart size is upper normal. No pneumothorax or pleural fluid. No acute bony abnormality. The patient has chronic upper and mid to lower thoracic spine compression fractures, unchanged. IMPRESSION: No acute disease. Electronically Signed   By: Inge Rise M.D.   On: 07/28/2017 13:13      Impression / Plan:   Traci Duran is a 61 y.o. y/o female with Vasculopathy on aspirin and Plavix, chronic iron deficiency anemia presents with demand  ischemia and symptomatic anemia. She does report dark stools started 2weeks ago, h/o BC powder and poorly controlled heartburn. She always had low ferritin and was not on iron replacement as out pt.  -Please check ferritin, iron studies, B12, folate -She may need iron infusion and oral iron therapy as outpatient  -Plan for EGD after clearance from cardiology - Burna for full liquid diet - Continue protonix BID - Avoid NSAIDS - Increase protonix to 40mg  BID as out pt  Thank you for involving me in the care of this patient.      LOS: 0 days   Sherri Sear, MD  07/28/2017, 8:58 PM   Note: This dictation was prepared with Dragon dictation along with smaller phrase technology. Any transcriptional errors that result from this process are unintentional.

## 2017-07-28 NOTE — ED Notes (Signed)
Date and time results received: 07/28/17 1315  Test: Troponin I, Potassium Critical Value: 1.08, 2.7 respectively  Name of Provider Notified: Dr. Cinda Quest  Orders Received? Or Actions Taken?: Orders Received - See Orders for details

## 2017-07-28 NOTE — Progress Notes (Signed)
ELECTROLYTE CONSULT NOTE - INITIAL   Pharmacy Consult for electrolyte monitoring/replacement Indication: Hypokalemia  Allergies  Allergen Reactions  . Chantix [Varenicline] Hives, Shortness Of Breath and Palpitations  . Diphenhydramine Hcl Shortness Of Breath  . Pregabalin Other (See Comments)    Falls  . Benadryl [Diphenhydramine Hcl] Rash  . Niacin Rash  . Trazodone Palpitations    Patient Measurements: Height: 5\' 7"  (170.2 cm) Weight: 142 lb 9.6 oz (64.7 kg) IBW/kg (Calculated) : 61.6  Labs:  Recent Labs  07/28/17 1239 07/28/17 1347  WBC 8.2  --   HGB 5.8*  --   HCT 19.9*  --   PLT 292  --   APTT  --  34  CREATININE 0.67  --   MG 1.8  --    Estimated Creatinine Clearance: 72.7 mL/min (by C-G formula based on SCr of 0.67 mg/dL).   Assessment: Pharmacy consulted to monitor and replace electrolytes in this 61 year old female with hypokalemia on admission.    Goal of Therapy: Electrolytes WNL  Plan:  K = 3.5 this evening after supplementation. No additional supplementation needed at this time.  Will recheck electrolytes with AM labs tomorrow.  Lenis Noon, PharmD, BCPS Clinical Pharmacist 07/28/2017,8:04 PM

## 2017-07-28 NOTE — H&P (Addendum)
[facility] - [place] [facility]   PATIENT NAME: Traci Duran    MR#:  [id number]  DATE OF BIRTH:  04-03-1956  DATE OF ADMISSION:  07/28/2017  PRIMARY CARE PHYSICIAN: Dan Humphreys, [facility]   REQUESTING/REFERRING PHYSICIAN: Dr. Darnelle Catalan  CHIEF COMPLAINT:   Chief Complaint  Patient presents with  . Chest Pain    HISTORY OF PRESENT ILLNESS:  Traci Duran  is a 61 y.o. female with a known history of Peripheral arterial disease, CAD, hypertension, gastritis, colon polyps, COPD, tobacco abuse presents to the emergency room complaining of one week off chest pain, diaphoresis and shortness of breath. She has been getting extremely fatigued with minimal activity. She does have claudication pains in both her legs chronically. On arrival to emergency room patient was found to have a troponin elevated at 1. Hemoglobin 5.8. Stool positive for occult blood. Her EKG shows mild ST depression in lateral/inferior leads.  Patient had EGD in May 2018 which showed gastritis with no bleeding. Colonoscopy in July 2018 which showed colon polyps and diverticula  Cardiac catheterization at [facility]- November 2010, Cardiac Cath: EF 85%, 80% stenosis in the RPDA and AM, 60% stenosis in the RPAV, distal LAD and D2, 40% stenosis in the proximal and mid RCA, proximal LAD and distal LAD. Medical management recommended.    12/2014 Echo: EF >55%, mild LVH, DD gr. I, LAE 3.9cm, Moderate TR (PRVP 35 mmHg)  12/2014 Nuclear Stress Test: no ischemia, EF 76%   PAST MEDICAL HISTORY:   Past Medical History:  Diagnosis Date  . Anxiety   . COPD (chronic obstructive pulmonary disease) ([facility])   . Coronary artery disease   . Depression   . Gastritis   . GERD (gastroesophageal reflux disease)   . Hemorrhoids   . Hyperlipidemia   . Hypertension   . PAD (peripheral artery disease) ([facility])     PAST SURGICAL HISTORY:   Past Surgical History:  Procedure Laterality Date  .  APPENDECTOMY    . COLONOSCOPY WITH PROPOFOL N/A 06/09/2017   Procedure: COLONOSCOPY WITH PROPOFOL;  Surgeon: Midge Minium, MD;  Location: [facility] ENDOSCOPY;  Service: Endoscopy;  Laterality: N/A;  . ESOPHAGOGASTRODUODENOSCOPY (EGD) WITH PROPOFOL N/A 04/09/2017   Procedure: ESOPHAGOGASTRODUODENOSCOPY (EGD) WITH PROPOFOL;  Surgeon: Midge Minium, MD;  Location: [facility] ENDOSCOPY;  Service: Endoscopy;  Laterality: N/A;  . HIP FRACTURE SURGERY      SOCIAL HISTORY:   Social History  Substance Use Topics  . Smoking status: Current Some Day Smoker    Packs/day: 0.50    Years: 20.00    Types: Cigarettes  . Smokeless tobacco: Never Used  . Alcohol use No    FAMILY HISTORY:   Family History  Problem Relation Age of Onset  . Heart failure Mother   . Heart attack Mother        mother died at 32 of an MI  . Heart attack Father        father died at 51 of an MI  . Heart attack Brother        Brother had an MI in his 60s    DRUG ALLERGIES:   Allergies  Allergen Reactions  . Chantix [Varenicline] Hives, Shortness Of Breath and Palpitations  . Diphenhydramine Hcl Shortness Of Breath  . Pregabalin Other (See Comments)    Falls  . Benadryl [Diphenhydramine Hcl] Rash  . Niacin Rash  . Trazodone Palpitations    REVIEW OF SYSTEMS:   Review of Systems  Constitutional: Positive  for diaphoresis and malaise/fatigue. Negative for chills and fever.  HENT: Negative for sore throat.   Eyes: Negative for blurred vision, double vision and pain.  Respiratory: Positive for shortness of breath. Negative for cough, hemoptysis and wheezing.   Cardiovascular: Positive for chest pain. Negative for palpitations, orthopnea and leg swelling.  Gastrointestinal: Negative for abdominal pain, constipation, diarrhea, heartburn, nausea and vomiting.  Genitourinary: Negative for dysuria and hematuria.  Musculoskeletal: Negative for back pain and joint pain.  Skin: Negative for rash.  Neurological: Positive for  weakness. Negative for sensory change, speech change, focal weakness and headaches.  Endo/Heme/Allergies: Does not bruise/bleed easily.  Psychiatric/Behavioral: Negative for depression. The patient is not nervous/anxious.    MEDICATIONS AT HOME:   Prior to Admission medications   Medication Sig Start Date End Date Taking? Authorizing Provider  ALPRAZolam Prudy Feeler) 1 MG tablet Take 1 mg by mouth 3 (three) times daily as needed. For anxiety    Yes [provider]  aspirin EC 81 MG tablet Take 81 mg by mouth daily.   Yes [provider]  atorvastatin (LIPITOR) 80 MG tablet Take 80 mg by mouth daily.   Yes [provider]  busPIRone (BUSPAR) 10 MG tablet Take 10 mg by mouth daily.  04/30/17  Yes [provider]  carvedilol (COREG) 6.25 MG tablet Take 6.25 mg by mouth 2 (two) times daily. 05/28/17  Yes [provider]  citalopram (CELEXA) 40 MG tablet Take 40 mg by mouth daily.   Yes [provider]  clopidogrel (PLAVIX) 75 MG tablet Take 75 mg by mouth daily.   Yes [provider]  DULoxetine (CYMBALTA) 30 MG capsule Take 30 mg by mouth daily. 05/28/17  Yes [provider]  gabapentin (NEURONTIN) 300 MG capsule Take 1 capsule by mouth 4 (four) times daily.  04/06/17  Yes [provider]  lisinopril (PRINIVIL,ZESTRIL) 20 MG tablet Take 20 mg by mouth daily.     Yes [provider]  nicotine (NICODERM CQ - DOSED IN MG/24 HOURS) 21 mg/24hr patch Place 1 patch (21 mg total) onto the skin daily. 04/10/17  Yes Mody, Patricia Pesa, MD  nitroGLYCERIN (NITROSTAT) 0.4 MG SL tablet Place under the tongue. 01/24/16  Yes [provider]  pantoprazole (PROTONIX) 40 MG tablet Take 1 tablet (40 mg total) by mouth daily. Patient taking differently: Take 20 mg by mouth daily.  04/09/17  Yes Mody, Patricia Pesa, MD  potassium chloride (K-DUR) 10 MEQ tablet Take 1 tablet (10 mEq total) by mouth daily. 05/28/17  Yes Emily Filbert, MD   Vitamin D, Ergocalciferol, (DRISDOL) 50000 units CAPS capsule TAKE 1 CAPSULE  50,000 UNITS TOTAL  BY MOUTH ONCE A WEEK 05/01/17  Yes [provider]     VITAL SIGNS:  Blood pressure 131/76, pulse 77, temperature 97.9 F (36.6 C), temperature source Oral, resp. rate 15, height 5\' 7"  (1.702 m), weight 61.2 kg (135 lb), SpO2 100 %.  PHYSICAL EXAMINATION:  Physical Exam  GENERAL:  61 y.o.-year-old patient lying in the bed with no acute distress.  EYES: Pupils equal, round, reactive to light and accommodation. No scleral icterus. Extraocular muscles intact.  HEENT: Head atraumatic, normocephalic. Oropharynx and nasopharynx clear. No oropharyngeal erythema, moist oral mucosa  NECK:  Supple, no jugular venous distention. No thyroid enlargement, no tenderness.  LUNGS: Normal breath sounds bilaterally, no wheezing, rales, rhonchi. No use of accessory muscles of respiration.  CARDIOVASCULAR: S1, S2 normal. No murmurs, rubs, or gallops.  ABDOMEN: Soft, nontender, nondistended. Bowel  sounds present. No organomegaly or mass.  EXTREMITIES: No pedal edema, cyanosis, or clubbing. + 2 pedal & radial pulses b/l.   NEUROLOGIC: Cranial nerves II through XII are intact. No focal Motor or sensory deficits appreciated b/l PSYCHIATRIC: The patient is alert and oriented x 3. Good affect.  SKIN: No obvious rash, lesion, or ulcer.   LABORATORY PANEL:   CBC  Recent Labs Lab 07/28/17 1239  WBC 8.2  HGB 5.8*  HCT 19.9*  PLT 292   ------------------------------------------------------------------------------------------------------------------  Chemistries   Recent Labs Lab 07/28/17 1239  NA 139  K 2.7*  CL 105  CO2 24  GLUCOSE 100*  BUN 12  CREATININE 0.67  CALCIUM 8.8*  MG 1.8   ------------------------------------------------------------------------------------------------------------------  Cardiac Enzymes  Recent Labs Lab 07/28/17 1239  TROPONINI 1.08*    ------------------------------------------------------------------------------------------------------------------  RADIOLOGY:  Dg Chest 2 View  Result Date: 07/28/2017 CLINICAL DATA:  Chest pain for 1 week. EXAM: CHEST  2 VIEW COMPARISON:  Single-view of the chest 04/06/2017. PA and lateral chest 01/16/2017. FINDINGS: The lungs are clear. Heart size is upper normal. No pneumothorax or pleural fluid. No acute bony abnormality. The patient has chronic upper and mid to lower thoracic spine compression fractures, unchanged. IMPRESSION: No acute disease. Electronically Signed   By: Drusilla Kanner M.D.   On: 07/28/2017 13:13     IMPRESSION AND PLAN:   * GI bleed, likely upper with acute blood loss anemia Stool positive for topical blood. We'll transfuse 2 units packed RBC stat. Paged GI. Will discuss regarding an EGD. Start Protonix drip after a bolus. Serial hemoglobin checks. Further transfusion to keep hemoglobin over 8.  * Elevated troponin likely demand ischemia versus non-ST elevation MI. At this time patient cannot be on any aspirin or Plavix or heparin drip due to acute GI bleed. Discussed with Dr. Okey Dupre of cardiology. Will see patient. Telemetry monitoring. If troponins start trending up significantly will have to start aspirin. Nitroglycerin when necessary Repeat troponin. Check echocardiogram. May need cardiac catheterization depending on her progress.  * COPD. Nebulizers when necessary and home inhalers.  * Hypertension. Continue home medications.  * DVT prophylaxis with SCDs  Patient is critically ill with high risk of dehydration and complications.  All the records are reviewed and case discussed with ED provider. Management plans discussed with the patient, family and they are in agreement.  CODE STATUS: FULL CODE  TOTAL CC TIME TAKING CARE OF THIS PATIENT: 45 minutes.   Milagros Loll R M.D on 07/28/2017 at 2:17 PM  Between 7am to 6pm - Pager - [phone]  After  6pm go to [email or web address] - password [email or web address]  [facility]  Office  [phone]  CC: Primary care physician; [name]  Note: This dictation was prepared with Dragon dictation along with smaller phrase technology. Any transcriptional errors that result from this process are unintentional.

## 2017-07-28 NOTE — ED Triage Notes (Signed)
Pt to ED reporting centralized chest pain for the past week. Pt reports pain does not radiate but that she also is having 7/10 bilateral leg pain with the chest pain. Last night pt reports having been SOB and diaphoretic with NV. Pt has heart hx but reports this pain feels similar to GERD.   Pt verbalize dover the past 2 days she has been taking Nitro at home that has been relieving pain. Pt denies having taken nitro today but pt verbalize having taken 81 mg ASA.

## 2017-07-28 NOTE — Consult Note (Signed)
Cardiology Consultation Note  Patient ID: Traci Duran, MRN: [id number], DOB/AGE: 05/30/1956 61 y.o. Admit date: 07/28/2017   Date of Consult: 07/28/2017 Primary Physician: [name] Primary Cardiologist: [facility] Requesting Physician: Dr. Elpidio Anis, MD  Chief Complaint: Reflux/chest pain/fatigue/SOb Reason for Consult: Elevated troponin  HPI: Traci Duran is a 61 y.o. female who is being seen today for the evaluation of elevated troponin at the request of Dr. Elpidio Anis, MD. Patient has a h/o CAD medically managed, CVD with reported prior stroke, PAD, COPD 2/2 ongoing tobacco abuse, GI bleed with gastritis, and HTN who presented to [facility] with a 1 week history of increased SOB, reflux, chest pain, and fatigue.   Patient is followed by [facility]. Prior LHC in 2010 showed EF 85%, 80% stenosis in the RPDA and AM, 60% stenosis in the RPAV, distal LAD and D2, 40% stenosis in the proximal and mid RCA, proximal LAD and distal LAD. Medical management recommended. Most recent ischemic evaluation via stress test in 12/2014 showed no ischemia, EF 76%. Most recent echo from 09/2016 showed EF 55%, mild LVH, DD, normal RV systolic function, mild to moderate TR. She was admitted to [facility] in 03/2017 for HFpEF with anemia. Her HF was believed to be in the setting of elevated BP. Cardiology was not consulted at that time and no echo was performed. She underwent EGD that showed gastritis without bleeding and colonoscopy that showed polyps and diverticula. She was recently seen in the ED in June 2018 for lower GI bleeding with associated chest pain and abdominal pain. She was noted to be hypertensive with SBP 204 at that time. Cardiac enzymes were not checked. HGB was 8.6 with an approximate baseline of 9. Potassium was 2.5, she was given KCL PO. EKG not acute. She was advised to follow up as an outpatient.   Over the past 1 week she has noted increased melena with associated fatigue, SOB and reflux that  she describes as a "torch." Her symptoms have been getting worse over the past 24-48 hours. Developed intermittent left-sided chest pain on 8/20 that is still present, though improved. She continues to note mild reflux. Some diaphoresis, nausea without vomiting, and dizziness. No palpitations, presyncope, or syncope. Upon her arrival she was noted to have stable BP of 129 mmHg systolic, HR 85 bpm, afebrile, 100% on room air. Labs showed a worsening HGB of 5.8, troponin 1.08, potassium 2.7, SCr 0.67, Mg++ 1.8. CXR not acute, EKG NSR, 88 bpm, LVH with early repolarization, prolonged QT, nonspecific inferior st/t changes with st depression of lateral leads. She was given ASA in the ED along with Tylenol, KCl PO and IV, and nitro paste was applied. Upon admission, IM ordered 2 units of pRBC and asked cardiology to evaluate.    Past Medical History:  Diagnosis Date  . Anxiety   . COPD (chronic obstructive pulmonary disease) (HCC)   . Coronary artery disease   . Depression   . Gastritis   . GERD (gastroesophageal reflux disease)   . Hemorrhoids   . Hyperlipidemia   . Hypertension   . PAD (peripheral artery disease) (HCC)       Most Recent Cardiac Studies: As above   Surgical History:  Past Surgical History:  Procedure Laterality Date  . APPENDECTOMY    . COLONOSCOPY WITH PROPOFOL N/A 06/09/2017   Procedure: COLONOSCOPY WITH PROPOFOL;  Surgeon: Midge Minium, MD;  Location: [facility] ENDOSCOPY;  Service: Endoscopy;  Laterality: N/A;  . ESOPHAGOGASTRODUODENOSCOPY (EGD) WITH PROPOFOL N/A  04/09/2017   Procedure: ESOPHAGOGASTRODUODENOSCOPY (EGD) WITH PROPOFOL;  Surgeon: Midge Minium, MD;  Location: [facility] ENDOSCOPY;  Service: Endoscopy;  Laterality: N/A;  . HIP FRACTURE SURGERY       Home Meds: Prior to Admission medications   Medication Sig Start Date End Date Taking? Authorizing Provider  ALPRAZolam Prudy Feeler) 1 MG tablet Take 1 mg by mouth 3 (three) times daily as needed. For anxiety    Yes [provider]  aspirin EC 81 MG tablet Take 81 mg by mouth daily.   Yes [provider]  atorvastatin (LIPITOR) 80 MG tablet Take 80 mg by mouth daily.   Yes [provider]  busPIRone (BUSPAR) 10 MG tablet Take 10 mg by mouth daily.  04/30/17  Yes [provider]  carvedilol (COREG) 6.25 MG tablet Take 6.25 mg by mouth 2 (two) times daily. 05/28/17  Yes [provider]  citalopram (CELEXA) 40 MG tablet Take 40 mg by mouth daily.   Yes [provider]  clopidogrel (PLAVIX) 75 MG tablet Take 75 mg by mouth daily.   Yes [provider]  DULoxetine (CYMBALTA) 30 MG capsule Take 30 mg by mouth daily. 05/28/17  Yes [provider]  gabapentin (NEURONTIN) 300 MG capsule Take 1 capsule by mouth 4 (four) times daily.  04/06/17  Yes [provider]  lisinopril (PRINIVIL,ZESTRIL) 20 MG tablet Take 20 mg by mouth daily.     Yes [provider]  nicotine (NICODERM CQ - DOSED IN MG/24 HOURS) 21 mg/24hr patch Place 1 patch (21 mg total) onto the skin daily. 04/10/17  Yes Mody, Patricia Pesa, MD  nitroGLYCERIN (NITROSTAT) 0.4 MG SL tablet Place under the tongue. 01/24/16  Yes [provider]  pantoprazole (PROTONIX) 40 MG tablet Take 1 tablet (40 mg total) by mouth daily. Patient taking differently: Take 20 mg by mouth daily.  04/09/17  Yes Mody, Patricia Pesa, MD  potassium chloride (K-DUR) 10 MEQ tablet Take 1 tablet (10 mEq total) by mouth daily. 05/28/17  Yes Emily Filbert, MD  Vitamin D, Ergocalciferol, (DRISDOL) 50000 units CAPS capsule TAKE 1 CAPSULE  50,000 UNITS TOTAL  BY MOUTH ONCE A WEEK 05/01/17  Yes [provider]    Inpatient Medications:  . atorvastatin  80 mg Oral Daily  . busPIRone  10 mg Oral Daily  . carvedilol  6.25 mg Oral BID  . citalopram  40 mg Oral Daily  . DULoxetine  30 mg Oral Daily  . gabapentin  300 mg Oral QID  . [START ON 08/01/2017] pantoprazole  40 mg Intravenous Q12H  . [START ON 07/29/2017]  potassium chloride  20 mEq Oral Daily  . sodium chloride flush  3 mL Intravenous Q12H   . sodium chloride    . 0.9 % NaCl with KCl 20 mEq / L    . magnesium sulfate 1 - 4 g bolus IVPB    . pantoprazole (PROTONIX) IVPB    . pantoprozole (PROTONIX) infusion    . potassium chloride      Allergies:  Allergies  Allergen Reactions  . Chantix [Varenicline] Hives, Shortness Of Breath and Palpitations  . Diphenhydramine Hcl Shortness Of Breath  . Pregabalin Other (See Comments)    Falls  . Benadryl [Diphenhydramine Hcl] Rash  . Niacin Rash  . Trazodone Palpitations    Social History   Social History  . Marital status: Widowed    Spouse name: N/A  . Number of children: N/A  . Years of education: N/A  Occupational History  . Not on file.   Social History Main Topics  . Smoking status: Current Some Day Smoker    Packs/day: 0.50    Years: 20.00    Types: Cigarettes  . Smokeless tobacco: Never Used  . Alcohol use No  . Drug use: Yes    Types: Marijuana     Comment: 1 week ago Marijuana.   . Sexual activity: Not Currently   Other Topics Concern  . Not on file   Social History Narrative  . No narrative on file     Family History  Problem Relation Age of Onset  . Heart failure Mother   . Heart attack Mother        mother died at 41 of an MI  . Heart attack Father        father died at 69 of an MI  . Heart attack Brother        Brother had an MI in his 5s     Review of Systems: Review of Systems  Constitutional: Positive for diaphoresis and malaise/fatigue. Negative for chills, fever and weight loss.  HENT: Negative for congestion.   Eyes: Negative for discharge and redness.  Respiratory: Positive for shortness of breath. Negative for cough, hemoptysis, sputum production and wheezing.   Cardiovascular: Positive for chest pain. Negative for palpitations, orthopnea, claudication, leg swelling and PND.  Gastrointestinal: Positive for nausea. Negative for abdominal  pain, blood in stool, heartburn, melena and vomiting.  Genitourinary: Negative for hematuria.  Musculoskeletal: Negative for falls and myalgias.  Skin: Negative for rash.  Neurological: Positive for weakness. Negative for dizziness, tingling, tremors, sensory change, speech change, focal weakness and loss of consciousness.  Endo/Heme/Allergies: Does not bruise/bleed easily.  Psychiatric/Behavioral: Negative for substance abuse. The patient is nervous/anxious.   All other systems reviewed and are negative.   Labs:  Recent Labs  07/28/17 1239  TROPONINI 1.08*   Lab Results  Component Value Date   WBC 8.2 07/28/2017   HGB 5.8 (L) 07/28/2017   HCT 19.9 (L) 07/28/2017   MCV 60.4 (L) 07/28/2017   PLT 292 07/28/2017     Recent Labs Lab 07/28/17 1239  NA 139  K 2.7*  CL 105  CO2 24  BUN 12  CREATININE 0.67  CALCIUM 8.8*  GLUCOSE 100*   Lab Results  Component Value Date   CHOL 214 (H) 10/15/2011   HDL 45 10/15/2011   LDLCALC 124 (H) 10/15/2011   TRIG 225 (H) 10/15/2011   No results found for: DDIMER  Radiology/Studies:  Dg Chest 2 View  Result Date: 07/28/2017 CLINICAL DATA:  Chest pain for 1 week. EXAM: CHEST  2 VIEW COMPARISON:  Single-view of the chest 04/06/2017. PA and lateral chest 01/16/2017. FINDINGS: The lungs are clear. Heart size is upper normal. No pneumothorax or pleural fluid. No acute bony abnormality. The patient has chronic upper and mid to lower thoracic spine compression fractures, unchanged. IMPRESSION: No acute disease. Electronically Signed   By: Drusilla Kanner M.D.   On: 07/28/2017 13:13    EKG: Interpreted by me showed: NSR, 88 bpm, LVH with early repolarization, prolonged QT, nonspecific inferior st/t changes with st depression of lateral leads Telemetry: Interpreted by me showed: NSR  Weights: Filed Weights   07/28/17 1238  Weight: 135 lb (61.2 kg)     Physical Exam: Blood pressure 133/90, pulse 82, temperature 97.9 F (36.6 C),  temperature source Oral, resp. rate 20, height 5\' 7"  (1.702 m), weight 135 lb (  61.2 kg), SpO2 95 %. Body mass index is 21.14 kg/m. General: Well developed, well nourished, in no acute distress, pale. Head: Normocephalic, atraumatic, sclera non-icteric, no xanthomas, nares are without discharge.  Neck: Left-sided carotid bruits. JVD not elevated. Lungs: Clear bilaterally to auscultation without wheezes, rales, or rhonchi. Breathing is unlabored. Heart: RRR with S1 S2. II/VI systolic murmur, no rubs, or gallops appreciated. Abdomen: Soft, non-tender, non-distended with normoactive bowel sounds. No hepatomegaly. No rebound/guarding. No obvious abdominal masses. Msk:  Strength and tone appear normal for age. Extremities: No clubbing or cyanosis. No edema. Distal pedal pulses are 2+ and equal bilaterally. Neuro: Alert and oriented X 3. No facial asymmetry. No focal deficit. Moves all extremities spontaneously. Psych:  Responds to questions appropriately with a normal affect.    Assessment and Plan:  Principal Problem:   GI bleed Active Problems:   Coronary artery disease, non-occlusive   Acute blood loss anemia   Chronic obstructive pulmonary disease (HCC)   Chest pain   Hyperlipidemia   Anxiety   Hypertension    1. Elevated troponin/chest pain: -Likely in the setting of her worsening anemia -Initial troponin elevated at 1.08, continue to cycle until peaks -Recommend pRBC transfusion to HGB > 8.5-10 -Unable to start heparin gtt given anemia -Not a good cath candidate given ongoing GI bleed -Check TTE -Will need ischemia workup prior to discharge once GI bleed is stable -Not on ASA given GI bleed -Continue PTA Coreg, lisinopril, and Lipitor -She was on Plavix and ASA at home for CVA, these are on hold at this time given bleed -Patient asks for narcotic pain medication, IM has ordered oxycodone   2. GI bleed/acute blood loss anemia: -Suspect this is her primary problem with her  anemia likely exacerbating #1 -Recommend pRBC transfusion as above -TTE pending, she will be at least a moderate risk for non-cardiac procedures -ASA and Plavix on hold -IV PPI per IM  3. Hypokalemia: -Likely in the setting of GI loss -Replete IV per IM to goal of 4.0 -Monitor on QT on telemetry and check 12-lead EKG once potassium is repleted   4. HLD: -Lipitor   5. HTN: -Controlled -Continue PTA meds   Signed, Carola Frost [facility] Pager: [phone] 07/28/2017, 3:35 PM

## 2017-07-28 NOTE — Progress Notes (Addendum)
ELECTROLYTE CONSULT NOTE - INITIAL   Pharmacy Consult for electrolyte monitoring/replacement Indication: Hypokalemia  Allergies  Allergen Reactions  . Chantix [Varenicline] Hives, Shortness Of Breath and Palpitations  . Diphenhydramine Hcl Shortness Of Breath  . Pregabalin Other (See Comments)    Falls  . Benadryl [Diphenhydramine Hcl] Rash  . Niacin Rash  . Trazodone Palpitations    Patient Measurements: Height: 5\' 7"  (170.2 cm) Weight: 135 lb (61.2 kg) IBW/kg (Calculated) : 61.6   Labs:  Recent Labs  07/28/17 1239 07/28/17 1347  WBC 8.2  --   HGB 5.8*  --   HCT 19.9*  --   PLT 292  --   APTT  --  34  CREATININE 0.67  --   MG 1.8  --    Estimated Creatinine Clearance: 72.3 mL/min (by C-G formula based on SCr of 0.67 mg/dL).   Assessment: Pharmacy consulted to monitor and replace electrolytes in this 61 year old female with hypokalemia on admission.   Mg = 1.8 K = 2.7  Goal of Therapy: Electrolytes WNL  Plan:  Magnesium sulfate 2 g IV once KCl 40 mEq PO given in ED. Will order another KCl 30 mEq IV Will recheck K this evening after supplementation.  Lenis Noon, PharmD Clinical Pharmacist 07/28/2017,2:17 PM

## 2017-07-28 NOTE — Progress Notes (Signed)
Patient is admitted to room 242 from ED. A&O x4. 2 NSL in place. Blood consent signed. Nitro paste in place. Tele applied. VSS. Potassium stated. Oriented to room, call light, TV, and bed controls. Reviewed POC.

## 2017-07-29 DIAGNOSIS — I2510 Atherosclerotic heart disease of native coronary artery without angina pectoris: Secondary | ICD-10-CM

## 2017-07-29 DIAGNOSIS — J432 Centrilobular emphysema: Secondary | ICD-10-CM

## 2017-07-29 DIAGNOSIS — D500 Iron deficiency anemia secondary to blood loss (chronic): Secondary | ICD-10-CM

## 2017-07-29 LAB — CBC
HCT: 25.8 % — ABNORMAL LOW (ref 35.0–47.0)
Hemoglobin: 8 g/dL — ABNORMAL LOW (ref 12.0–16.0)
MCH: 21.1 pg — AB (ref 26.0–34.0)
MCHC: 31.2 g/dL — AB (ref 32.0–36.0)
MCV: 67.7 fL — ABNORMAL LOW (ref 80.0–100.0)
Platelets: 206 10*3/uL (ref 150–440)
RBC: 3.81 MIL/uL (ref 3.80–5.20)
RDW: 24.2 % — AB (ref 11.5–14.5)
WBC: 7.9 10*3/uL (ref 3.6–11.0)

## 2017-07-29 LAB — BASIC METABOLIC PANEL
Anion gap: 5 (ref 5–15)
BUN: 10 mg/dL (ref 6–20)
CALCIUM: 8 mg/dL — AB (ref 8.9–10.3)
CO2: 25 mmol/L (ref 22–32)
Chloride: 108 mmol/L (ref 101–111)
Creatinine, Ser: 0.63 mg/dL (ref 0.44–1.00)
GFR calc Af Amer: >60 mL/min (ref >60)
GLUCOSE: 95 mg/dL (ref 65–99)
Potassium: 3.7 mmol/L (ref 3.5–5.1)
Sodium: 138 mmol/L (ref 135–145)

## 2017-07-29 LAB — ECHOCARDIOGRAM COMPLETE
HEIGHTINCHES: 67 in
WEIGHTICAEL: 2281.6 [oz_av]

## 2017-07-29 LAB — MAGNESIUM: MAGNESIUM: 2.3 mg/dL (ref 1.7–2.4)

## 2017-07-29 LAB — IRON AND TIBC
Iron: 13 ug/dL — ABNORMAL LOW (ref 28–170)
SATURATION RATIOS: 4 % — AB (ref 10.4–31.8)
TIBC: 359 ug/dL (ref 250–450)
UIBC: 346 ug/dL

## 2017-07-29 LAB — HIV ANTIBODY (ROUTINE TESTING W REFLEX): HIV SCREEN 4TH GENERATION: NONREACTIVE

## 2017-07-29 LAB — HEMOGLOBIN: Hemoglobin: 7.7 g/dL — ABNORMAL LOW (ref 12.0–16.0)

## 2017-07-29 LAB — VITAMIN B12: VITAMIN B 12: 247 pg/mL (ref 180–914)

## 2017-07-29 MED ORDER — DOCUSATE SODIUM 100 MG PO CAPS
100.0000 mg | ORAL_CAPSULE | Freq: Two times a day (BID) | ORAL | Status: DC
  Administered 2017-07-29 – 2017-07-30 (×2): 100 mg via ORAL
  Filled 2017-07-29 (×2): qty 1

## 2017-07-29 MED ORDER — SODIUM CHLORIDE 0.9 % IV SOLN
510.0000 mg | Freq: Once | INTRAVENOUS | Status: AC
  Administered 2017-07-30: 510 mg via INTRAVENOUS
  Filled 2017-07-29: qty 17

## 2017-07-29 MED ORDER — SODIUM CHLORIDE 0.9 % IV SOLN
510.0000 mg | Freq: Once | INTRAVENOUS | Status: DC
  Filled 2017-07-29: qty 17

## 2017-07-29 MED ORDER — POLYETHYLENE GLYCOL 3350 17 G PO PACK
17.0000 g | PACK | Freq: Every day | ORAL | Status: DC
  Administered 2017-07-30: 17 g via ORAL
  Filled 2017-07-29: qty 1

## 2017-07-29 MED ORDER — POTASSIUM CHLORIDE CRYS ER 20 MEQ PO TBCR
40.0000 meq | EXTENDED_RELEASE_TABLET | Freq: Once | ORAL | Status: AC
  Administered 2017-07-29: 40 meq via ORAL
  Filled 2017-07-29: qty 2

## 2017-07-29 MED ORDER — VITAMIN B-12 1000 MCG PO TABS
1000.0000 ug | ORAL_TABLET | Freq: Every day | ORAL | Status: DC
  Administered 2017-07-29 – 2017-07-30 (×2): 1000 ug via ORAL
  Filled 2017-07-29 (×2): qty 1

## 2017-07-29 MED ORDER — POTASSIUM CHLORIDE CRYS ER 20 MEQ PO TBCR
20.0000 meq | EXTENDED_RELEASE_TABLET | Freq: Every day | ORAL | Status: DC
  Administered 2017-07-30: 20 meq via ORAL
  Filled 2017-07-29: qty 1

## 2017-07-29 MED ORDER — BISACODYL 10 MG RE SUPP
10.0000 mg | Freq: Every day | RECTAL | Status: DC
  Administered 2017-07-29: 10 mg via RECTAL
  Filled 2017-07-29 (×2): qty 1

## 2017-07-29 MED ORDER — SODIUM CHLORIDE 0.9 % IV SOLN
INTRAVENOUS | Status: DC
  Administered 2017-07-30: 07:00:00 via INTRAVENOUS

## 2017-07-29 MED ORDER — ALPRAZOLAM 1 MG PO TABS
1.0000 mg | ORAL_TABLET | Freq: Three times a day (TID) | ORAL | Status: DC
  Administered 2017-07-29 – 2017-07-30 (×6): 1 mg via ORAL
  Filled 2017-07-29 (×6): qty 1

## 2017-07-29 MED ORDER — IRON SUCROSE 20 MG/ML IV SOLN
200.0000 mg | Freq: Once | INTRAVENOUS | Status: AC
  Administered 2017-07-29: 200 mg via INTRAVENOUS
  Filled 2017-07-29: qty 10

## 2017-07-29 MED ORDER — FUROSEMIDE 10 MG/ML IJ SOLN
40.0000 mg | Freq: Once | INTRAMUSCULAR | Status: AC
  Administered 2017-07-29: 40 mg via INTRAVENOUS
  Filled 2017-07-29: qty 4

## 2017-07-29 NOTE — Progress Notes (Signed)
Patient Name: Traci Duran Date of Encounter: 07/29/2017  Primary Cardiologist: [facility] Problem List     Principal Problem:   GI bleed Active Problems:   Coronary artery disease, non-occlusive   Acute blood loss anemia   Chronic obstructive pulmonary disease (HCC)   Chest pain   Hyperlipidemia   Anxiety   Hypertension   Demand ischemia (HCC)     Subjective   Breathing improving, though still somewhat SOB. Continues to note "reflux." S/p 2 units pRBC with HGB this morning 8.0 (up from 5.8 on 8/21). Troponin down trending to 0.71 currently with a peak of 1.08. TTE pending, though preliminary read with MD showed normal EF, aortic sclerosis, elevated PASP ~ 51 mmHg. Magnesium repleted. Potassium improving to 3.7 from 2.7 in the ED on 8/21. Possible EGD today. Patient reports she is holding on to fluid, stating she has never weighed 150 pounds (she reports a baseline ~ 130 pounds). She has previously blamed this on "peanuts."  Inpatient Medications    Scheduled Meds: . atorvastatin  80 mg Oral q1800  . bisacodyl  10 mg Rectal Daily  . busPIRone  10 mg Oral Daily  . carvedilol  6.25 mg Oral BID WC  . citalopram  40 mg Oral Daily  . docusate sodium  100 mg Oral BID  . DULoxetine  30 mg Oral Daily  . furosemide  40 mg Intravenous Once  . gabapentin  300 mg Oral QID  . [START ON 08/01/2017] pantoprazole  40 mg Intravenous Q12H  . polyethylene glycol  17 g Oral Daily  . [START ON 07/30/2017] potassium chloride  20 mEq Oral Daily  . sodium chloride flush  3 mL Intravenous Q12H   Continuous Infusions: . [START ON 07/30/2017] ferumoxytol    . iron sucrose    . pantoprazole (PROTONIX) IVPB    . pantoprozole (PROTONIX) infusion 8 mg/hr (07/28/17 1745)   PRN Meds: acetaminophen **OR** acetaminophen, albuterol, ALPRAZolam, nitroGLYCERIN, ondansetron **OR** ondansetron (ZOFRAN) IV, oxyCODONE   Vital Signs    Vitals:   07/28/17 2314 07/29/17 0201 07/29/17 0432 07/29/17  0924  BP: 105/73 140/80 127/83 128/77  Pulse: 79 85 82 92  Resp: 16 12  19   Temp: 98.1 F (36.7 C) 98.2 F (36.8 C) 97.8 F (36.6 C)   TempSrc: Oral Oral Oral   SpO2: 93% 90% 91% 91%  Weight:   154 lb 3.2 oz (69.9 kg)   Height:        Intake/Output Summary (Last 24 hours) at 07/29/17 1100 Last data filed at 07/29/17 0935  Gross per 24 hour  Intake             1282 ml  Output              250 ml  Net             1032 ml   Filed Weights   07/28/17 1238 07/28/17 1515 07/29/17 0432  Weight: 135 lb (61.2 kg) 142 lb 9.6 oz (64.7 kg) 154 lb 3.2 oz (69.9 kg)    Physical Exam    GEN: Well nourished, well developed, in no acute distress. Pale appearing. HEENT: Grossly normal.  Neck: Supple, no JVD, carotid bruits, or masses. Cardiac: RRR, II/VI systolic murmur, no rubs, or gallops. No clubbing, cyanosis, edema.  Radials/DP/PT 2+ and equal bilaterally.  Respiratory:  Respirations regular and unlabored, clear to auscultation bilaterally. GI: Soft, nontender, nondistended, BS + x 4. MS: no deformity or atrophy.  Skin: warm and dry, no rash. Neuro:  Strength and sensation are intact. Psych: AAOx3.  Normal affect.  Labs    CBC  Recent Labs  07/28/17 1239 07/29/17 0406  WBC 8.2 7.9  HGB 5.8* 8.0*  HCT 19.9* 25.8*  MCV 60.4* 67.7*  PLT 292 761   Basic Metabolic Panel  Recent Labs  07/28/17 1239 07/28/17 1920 07/29/17 0406  NA 139  --  138  K 2.7* 3.5 3.7  CL 105  --  108  CO2 24  --  25  GLUCOSE 100*  --  95  BUN 12  --  10  CREATININE 0.67  --  0.63  CALCIUM 8.8*  --  8.0*  MG 1.8  --  2.3   Liver Function Tests No results for input(s): AST, ALT, ALKPHOS, BILITOT, PROT, ALBUMIN in the last 72 hours. No results for input(s): LIPASE, AMYLASE in the last 72 hours. Cardiac Enzymes  Recent Labs  07/28/17 1239 07/28/17 1642 07/28/17 2242  TROPONINI 1.08* 1.03* 0.71*   BNP Invalid input(s): POCBNP D-Dimer No results for input(s): DDIMER in the last 72  hours. Hemoglobin A1C No results for input(s): HGBA1C in the last 72 hours. Fasting Lipid Panel No results for input(s): CHOL, HDL, LDLCALC, TRIG, CHOLHDL, LDLDIRECT in the last 72 hours. Thyroid Function Tests No results for input(s): TSH, T4TOTAL, T3FREE, THYROIDAB in the last 72 hours.  Invalid input(s): FREET3  Telemetry    NSR - Personally Reviewed  ECG    n/a - Personally Reviewed  Radiology    Dg Chest 2 View  Result Date: 07/28/2017 IMPRESSION: No acute disease. Electronically Signed   By: Inge Rise M.D.   On: 07/28/2017 13:13    Cardiac Studies   TTE pending, preliminary read as above  Patient Profile     61 y.o. female with history of CAD medically managed, CVD with reported prior stroke, PAD, COPD 2/2 ongoing tobacco abuse, GI bleed with gastritis, and HTN who presented to [facility] with a 1 week history of increased SOB, reflux, chest pain, and fatigue. She was found to have acute blood loss anemia with HGB 5.8 and a mildly elevated troponin with a peak of 1.08, down trending.   Assessment & Plan    1.  Elevated troponin/chest pain: -Likely in the setting of her worsening anemia -Initial troponin elevated at 1.08, down trending -Recommend pRBC transfusion to HGB > 8.5-10 -Unable to start heparin gtt given anemia -Not a good cath candidate given ongoing GI bleed -Check TTE -Will need ischemia workup with stress testing once HGB is found to be stable (not a candidate at this time for DAPT given GI bleed) -Not on ASA given GI bleed -Continue PTA Coreg, lisinopril, and Lipitor -She was on Plavix and ASA at home for CVA, these are on hold at this time given bleed   2. GI bleed/acute blood loss anemia: -Suspect this is her primary problem with her anemia likely exacerbating #1 -Status post 2 units pRBC -TTE with preliminary read of normal EF, aortic sclerosis and elevated PASP -For EGD today, at moderate risk for non-cardiac procedure -ASA and Plavix on  hold -IV PPI per IM  3. Hypokalemia: -Likely in the setting of GI loss -Improving, continue to replete to goal of at least 4.0 -Monitor on QT on telemetry and check 12-lead EKG once potassium is repleted   4. HLD: -Lipitor   5. HTN: -Controlled -Continue PTA meds  Signed, Christell Faith, PA-C [facility] Pager: [phone])  [phone] 07/29/2017, 11:00 AM   Attending Note Patient seen and examined, agree with detailed note above,  Patient presentation and plan discussed on rounds.  EKG lab work, chest x-ray, echocardiogram reviewed independently by myself  Feels well as morning, no significant shortness of breath Reports weight is high for her 150 pounds Echocardiogram reviewed personally by me cell showing normal LV function mild LVH moderate MR and TR with moderately elevated right heart pressures  On further discussion, high salt intake at home with high intake of peanuts Significant smoking history  Hemoglobin 8.0, troponin 1.08 and 0.71 Potassium up to 3.7  On physical exam appears comfortable laying supine lungs with some Rales at the bases bilaterally, heart sounds regular with 119 systolic ejection murmur heard left sternal border, abdomen soft nontender, no significant lower extremity edema  A/P 1.  Elevated troponin/chest pain: Likely demand ischemia in the setting of her worsening anemia troponin down trending -Unable to start heparin gtt given anemia -Not a good stress test or cath candidate given ongoing GI bleed -Not on ASA given GI bleed  2. GI bleed/acute blood loss anemia: -Suspect this is her primary problem with her anemia likely exacerbating #1 -Status post 2 units pRBC Acceptable risk for EGD today, at moderate risk for non-cardiac procedure -ASA and Plavix on hold -IV PPI per IM  3. HLD: -Lipitor   4. HTN: -Controlled, Continue PTA meds  Case discussed with hospitalist service, echocardiogram reviewed personally with Dr. Idamae Lusher  discussion with patient, she prefers to try treadmill stress test as an outpatient later date once anemia improved Greater than 50% was spent in counseling and coordination of care with patient Total encounter time 35 minutes or more   Signed: Esmond Plants  M.D., Ph.D. [facility]

## 2017-07-29 NOTE — Plan of Care (Signed)
Problem: Physical Regulation: Goal: Ability to maintain clinical measurements within normal limits will improve Outcome: Not Progressing Hemoglobin = only 8 today. Will continue to monitor. Traci Duran [facility]

## 2017-07-29 NOTE — Progress Notes (Signed)
ELECTROLYTE CONSULT NOTE - INITIAL   Pharmacy Consult for electrolyte monitoring/replacement Indication: Hypokalemia  Allergies  Allergen Reactions  . Chantix [Varenicline] Hives, Shortness Of Breath and Palpitations  . Diphenhydramine Hcl Shortness Of Breath  . Pregabalin Other (See Comments)    Falls  . Benadryl [Diphenhydramine Hcl] Rash  . Niacin Rash  . Trazodone Palpitations    Patient Measurements: Height: 5\' 7"  (170.2 cm) Weight: 154 lb 3.2 oz (69.9 kg) IBW/kg (Calculated) : 61.6  Labs:  Recent Labs  07/28/17 1239 07/28/17 1347 07/29/17 0406  WBC 8.2  --  7.9  HGB 5.8*  --  8.0*  HCT 19.9*  --  25.8*  PLT 292  --  206  APTT  --  34  --   CREATININE 0.67  --  0.63  MG 1.8  --  2.3   Estimated Creatinine Clearance: 72.7 mL/min (by C-G formula based on SCr of 0.63 mg/dL).   Assessment: Pharmacy consulted to monitor and replace electrolytes in this 61 year old female with hypokalemia on admission.    Goal of Therapy: K=4-5.1 Mg= 2-2.4   Plan:  K=3.7, M=2.3 Will give KCL 40 MEQ once then will start 20 MEQ daily tomorrow (home dose was 10 MEQ daily) Pt is currently on 0.9% NaCl w/ 20 KCL @ 45ml/hr (1 MEQ/hr) Goal K > 4 per cardiology  Ramond Dial, PharmD, BCPS Clinical Pharmacist 07/29/2017,7:46 AM

## 2017-07-29 NOTE — Progress Notes (Signed)
[facility] [place] at [facility]   PATIENT NAME: Traci Duran    MR#:  [id number]  DATE OF BIRTH:  08-Apr-1956  SUBJECTIVE:   Came in with increasing shortness of breath and found to have hemoglobin of 5.8 with melanotic stools. REVIEW OF SYSTEMS:   Review of Systems  Constitutional: Negative for chills, fever and weight loss.  HENT: Negative for ear discharge, ear pain and nosebleeds.   Eyes: Negative for blurred vision, pain and discharge.  Respiratory: Positive for shortness of breath. Negative for sputum production, wheezing and stridor.   Cardiovascular: Negative for chest pain, palpitations, orthopnea and PND.  Gastrointestinal: Positive for constipation. Negative for abdominal pain, diarrhea, nausea and vomiting.  Genitourinary: Negative for frequency and urgency.  Musculoskeletal: Negative for back pain and joint pain.  Neurological: Positive for weakness. Negative for sensory change, speech change and focal weakness.  Psychiatric/Behavioral: Negative for depression and hallucinations. The patient is not nervous/anxious.    Tolerating Diet:CLEar Tolerating PT: ambulatory  DRUG ALLERGIES:   Allergies  Allergen Reactions  . Chantix [Varenicline] Hives, Shortness Of Breath and Palpitations  . Diphenhydramine Hcl Shortness Of Breath  . Pregabalin Other (See Comments)    Falls  . Benadryl [Diphenhydramine Hcl] Rash  . Niacin Rash  . Trazodone Palpitations    VITALS:  Blood pressure (!) 142/76, pulse 91, temperature 98.1 F (36.7 C), resp. rate 16, height 5\' 7"  (1.702 m), weight 69.9 kg (154 lb 3.2 oz), SpO2 93 %.  PHYSICAL EXAMINATION:   Physical Exam  GENERAL:  61 y.o.-year-old patient lying in the bed with no acute distress. Pallor+ EYES: Pupils equal, round, reactive to light and accommodation. No scleral icterus. Extraocular muscles intact.  HEENT: Head atraumatic, normocephalic. Oropharynx and nasopharynx clear.  NECK:  Supple,  no jugular venous distention. No thyroid enlargement, no tenderness.  LUNGS: Normal breath sounds bilaterally, no wheezing, rales, rhonchi. No use of accessory muscles of respiration.  CARDIOVASCULAR: S1, S2 normal. No murmurs, rubs, or gallops.  ABDOMEN: Soft, nontender, nondistended. Bowel sounds present. No organomegaly or mass.  EXTREMITIES: No cyanosis, clubbing or edema b/l.    NEUROLOGIC: Cranial nerves II through XII are intact. No focal Motor or sensory deficits b/l.   PSYCHIATRIC:  patient is alert and oriented x 3.  SKIN: No obvious rash, lesion, or ulcer.   LABORATORY PANEL:  CBC  Recent Labs Lab 07/29/17 0406  WBC 7.9  HGB 8.0*  HCT 25.8*  PLT 206    Chemistries   Recent Labs Lab 07/29/17 0406  NA 138  K 3.7  CL 108  CO2 25  GLUCOSE 95  BUN 10  CREATININE 0.63  CALCIUM 8.0*  MG 2.3   Cardiac Enzymes  Recent Labs Lab 07/28/17 2242  TROPONINI 0.71*   RADIOLOGY:  Dg Chest 2 View  Result Date: 07/28/2017 CLINICAL DATA:  Chest pain for 1 week. EXAM: CHEST  2 VIEW COMPARISON:  Single-view of the chest 04/06/2017. PA and lateral chest 01/16/2017. FINDINGS: The lungs are clear. Heart size is upper normal. No pneumothorax or pleural fluid. No acute bony abnormality. The patient has chronic upper and mid to lower thoracic spine compression fractures, unchanged. IMPRESSION: No acute disease. Electronically Signed   By: Drusilla Kanner M.D.   On: 07/28/2017 13:13   ASSESSMENT AND PLAN:   Traci Duran  is a 61 y.o. female with a known history of Peripheral arterial disease, CAD, hypertension, gastritis, colon polyps, COPD, tobacco abuse presents to the emergency room  complaining of one week off chest pain, diaphoresis and shortness of breath. She has been getting extremely fatigued with minimal activity. She does have claudication pains in both her legs chronically. On arrival to emergency room patient was found to have a troponin elevated at 1. Hemoglobin 5.8.  Stool positive for occult blood.  * GI bleed, likely upper with acute blood loss anemia -Stool positive for topical blood. -Came in with hemoglobin of 5.8 --- transfused 2 units packed RBC --- 8.0  -Patient's hemoglobin is anywhere from 8.3-9.5  -EGD to be done by GI tomorrow. No anesthesia available today. Cardiology seen patient okay for GI evaluation. -IV Protonix drip   * Elevated troponin likely demand ischemia  - patient cannot be on any aspirin or Plavix or heparin drip due to acute GI bleed. Discussed with Dr.gollan of cardiology. Appreciate input. Patient okay from cardiology standpoint for GI procedure  -Patient denies any chest pain -Nitroglycerin when necessary  * COPD. Nebulizers when necessary and home inhalers.  * Hypertension. Continue home medications.  * DVT prophylaxis with SCDs  *Constipation chronic -Recommend Dulcolax suppository and bowel regimen on a regular basis  Case discussed with Care Management/Social Worker. Management plans discussed with the patient, family and they are in agreement.  CODE STATUS: Full  DVT Prophylaxis: SCD  TOTAL TIME TAKING CARE OF THIS PATIENT: 30 minutes.  >50% time spent on counselling and coordination of care  POSSIBLE D/C IN one to 2 DAYS, DEPENDING ON CLINICAL CONDITION.  Note: This dictation was prepared with Dragon dictation along with smaller phrase technology. Any transcriptional errors that result from this process are unintentional.  Stella Encarnacion M.D on 07/29/2017 at 1:58 PM  Between 7am to 6pm - Pager - [phone]  After 6pm go to [email or web address] - Social research officer, government  [facility]  Office  [phone]  CC: Primary care physician; [name]

## 2017-07-29 NOTE — Progress Notes (Signed)
Traci Darby, MD [address]  [place]  [place], [place] [address]  Main: [phone]  Fax: [phone] Pager: [phone]   Traci Duran is being followed for iron deficiency anemia, Day 1 of follow up    Subjective: Feels constipated Received 2 u PRBCs, now receiving IV iron She is seen by cardiology  Objective: Vital signs in last 24 hours: Vitals:   07/28/17 2314 07/29/17 0201 07/29/17 0432 07/29/17 0924  BP: 105/73 140/80 127/83 128/77  Pulse: 79 85 82 92  Resp: 16 12  19   Temp: 98.1 F (36.7 C) 98.2 F (36.8 C) 97.8 F (36.6 C)   TempSrc: Oral Oral Oral   SpO2: 93% 90% 91% 91%  Weight:   69.9 kg (154 lb 3.2 oz)   Height:       Weight change:   Intake/Output Summary (Last 24 hours) at 07/29/17 0944 Last data filed at 07/29/17 7741  Gross per 24 hour  Intake             1282 ml  Output                0 ml  Net             1282 ml     Exam: Heart:: Regular rate and rhythm Lungs: clear to auscultation Abdomen: soft, nontender, normal bowel sounds   Lab Results: @LABTEST2 @ Micro Results: No results found for this or any previous visit (from the past 240 hour(s)). Studies/Results: Dg Chest 2 View  Result Date: 07/28/2017 CLINICAL DATA:  Chest pain for 1 week. EXAM: CHEST  2 VIEW COMPARISON:  Single-view of the chest 04/06/2017. PA and lateral chest 01/16/2017. FINDINGS: The lungs are clear. Heart size is upper normal. No pneumothorax or pleural fluid. No acute bony abnormality. The patient has chronic upper and mid to lower thoracic spine compression fractures, unchanged. IMPRESSION: No acute disease. Electronically Signed   By: Inge Rise M.D.   On: 07/28/2017 13:13   Medications: I have reviewed the patient's current medications. Scheduled Meds: . atorvastatin  80 mg Oral q1800  . busPIRone  10 mg Oral Daily  . carvedilol  6.25 mg Oral BID WC  . citalopram  40 mg Oral Daily  . DULoxetine  30 mg Oral Daily  . gabapentin  300 mg  Oral QID  . [START ON 08/01/2017] pantoprazole  40 mg Intravenous Q12H  . [START ON 07/30/2017] potassium chloride  20 mEq Oral Daily  . sodium chloride flush  3 mL Intravenous Q12H   Continuous Infusions: . 0.9 % NaCl with KCl 20 mEq / L 50 mL/hr at 07/28/17 1621  . ferumoxytol    . pantoprazole (PROTONIX) IVPB    . pantoprozole (PROTONIX) infusion 8 mg/hr (07/28/17 1745)   PRN Meds:.acetaminophen **OR** acetaminophen, albuterol, ALPRAZolam, nitroGLYCERIN, ondansetron **OR** ondansetron (ZOFRAN) IV, oxyCODONE   Assessment: Principal Problem:   GI bleed Active Problems:   Hyperlipidemia   Chronic obstructive pulmonary disease (HCC)   Coronary artery disease, non-occlusive   Anxiety   Hypertension   Chest pain   Acute blood loss anemia   Demand ischemia (HCC)  Traci Duran is a 61 y.o. 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 She does report dark stools started 2weeks ago, h/o BC powder and poorly controlled heartburn. She always had low ferritin and was not on iron replacement as out pt. She is currently on full  dose aspirin. Plavix is held  Ferritin 4, B12 is 247 Folate pending  Plan: - Start oral iron 325mg  TID - senna or miralax while on oral iron to avoid constipation - Start B12 1070mcg daily and continue as outpt - Cleared by cardiology to perform EGD - EGD tomorrow  - Continue Protonix 40mg  BID as out pt as long as she is on aspirin and plavix - NPO past midnight - Follow up in clinic with me or Dr Allen Norris in 2weeks after discharge   LOS: 1 day   Amyra Vantuyl 07/29/2017, 9:44 AM

## 2017-07-30 ENCOUNTER — Inpatient Hospital Stay: Payer: Medicare Other | Admitting: Certified Registered"

## 2017-07-30 ENCOUNTER — Encounter
Admission: EM | Disposition: A | Discharge status: Home / Self Care | Payer: Self-pay | Source: Home / Self Care | Attending: Internal Medicine

## 2017-07-30 HISTORY — PX: ESOPHAGOGASTRODUODENOSCOPY: SHX5428

## 2017-07-30 LAB — FOLATE RBC
FOLATE, HEMOLYSATE: 309.3 ng/mL
Folate, RBC: 1375 ng/mL (ref >498)
Hematocrit: 22.5 % — ABNORMAL LOW (ref 34.0–46.6)

## 2017-07-30 LAB — POTASSIUM: POTASSIUM: 4.2 mmol/L (ref 3.5–5.1)

## 2017-07-30 LAB — HEMOGLOBIN AND HEMATOCRIT, BLOOD
HCT: 30.4 % — ABNORMAL LOW (ref 35.0–47.0)
Hemoglobin: 9.6 g/dL — ABNORMAL LOW (ref 12.0–16.0)

## 2017-07-30 LAB — PREPARE RBC (CROSSMATCH)

## 2017-07-30 SURGERY — EGD (ESOPHAGOGASTRODUODENOSCOPY)
Anesthesia: General

## 2017-07-30 MED ORDER — PROPOFOL 10 MG/ML IV BOLUS
INTRAVENOUS | Status: DC | PRN
  Administered 2017-07-30 (×2): 50 mg via INTRAVENOUS

## 2017-07-30 MED ORDER — CYANOCOBALAMIN 1000 MCG PO TABS: 1000.0000 ug | ORAL_TABLET | Freq: Every day | ORAL | 3 refills | Status: DC

## 2017-07-30 MED ORDER — SODIUM CHLORIDE 0.9 % IV SOLN: Freq: Once | INTRAVENOUS | Status: DC

## 2017-07-30 MED ORDER — GLYCOPYRROLATE 0.2 MG/ML IJ SOLN
INTRAMUSCULAR | Status: AC
  Filled 2017-07-30: qty 1

## 2017-07-30 MED ORDER — FERROUS SULFATE 325 (65 FE) MG PO TABS: 325.0000 mg | ORAL_TABLET | Freq: Three times a day (TID) | ORAL | 3 refills | Status: DC

## 2017-07-30 MED ORDER — LIDOCAINE 2% (20 MG/ML) 5 ML SYRINGE
INTRAMUSCULAR | Status: DC | PRN
  Administered 2017-07-30: 25 mg via INTRAVENOUS

## 2017-07-30 MED ORDER — DOCUSATE SODIUM 100 MG PO CAPS: 100.0000 mg | ORAL_CAPSULE | Freq: Two times a day (BID) | ORAL | 0 refills | Status: DC

## 2017-07-30 MED ORDER — LIDOCAINE HCL (PF) 2 % IJ SOLN
INTRAMUSCULAR | Status: AC
Start: 2017-07-30 — End: 2017-07-30
  Filled 2017-07-30: qty 2

## 2017-07-30 MED ORDER — PROPOFOL 500 MG/50ML IV EMUL
INTRAVENOUS | Status: AC
  Filled 2017-07-30: qty 50

## 2017-07-30 MED ORDER — MIDAZOLAM HCL 2 MG/2ML IJ SOLN
INTRAMUSCULAR | Status: AC
  Filled 2017-07-30: qty 2

## 2017-07-30 MED ORDER — PANTOPRAZOLE SODIUM 40 MG PO TBEC: 40.0000 mg | DELAYED_RELEASE_TABLET | Freq: Two times a day (BID) | ORAL | 2 refills | Status: DC

## 2017-07-30 MED ORDER — MIDAZOLAM HCL 5 MG/5ML IJ SOLN
INTRAMUSCULAR | Status: DC | PRN
  Administered 2017-07-30: 2 mg via INTRAVENOUS

## 2017-07-30 MED ORDER — ASPIRIN EC 81 MG PO TBEC: 81.0000 mg | DELAYED_RELEASE_TABLET | Freq: Every day | ORAL | 0 refills | Status: DC

## 2017-07-30 MED ORDER — FERROUS SULFATE 325 (65 FE) MG PO TABS
325.0000 mg | ORAL_TABLET | Freq: Three times a day (TID) | ORAL | Status: DC
  Administered 2017-07-30 (×2): 325 mg via ORAL
  Filled 2017-07-30 (×2): qty 1

## 2017-07-30 MED ORDER — GLYCOPYRROLATE 0.2 MG/ML IJ SOLN
INTRAMUSCULAR | Status: DC | PRN
  Administered 2017-07-30: 0.2 mg via INTRAVENOUS

## 2017-07-30 MED ORDER — POLYETHYLENE GLYCOL 3350 17 G PO PACK: 17.0000 g | PACK | Freq: Every day | ORAL | 0 refills | Status: DC

## 2017-07-30 MED ORDER — PROPOFOL 500 MG/50ML IV EMUL
INTRAVENOUS | Status: DC | PRN
  Administered 2017-07-30: 120 ug/kg/min via INTRAVENOUS

## 2017-07-30 MED ORDER — TRAMADOL HCL 50 MG PO TABS: 50.0000 mg | ORAL_TABLET | Freq: Four times a day (QID) | ORAL | 0 refills | Status: DC | PRN

## 2017-07-30 NOTE — Care Management (Signed)
No discharge needs identified by members of the care team 

## 2017-07-30 NOTE — Progress Notes (Signed)
EGD 07/2017  -Normal duodenal bulb, second portion of the duodenum and third portion of the duodenum.  Biopsied.  - Non-bleeding erosive gastropathy.  - Erythematous mucosa in the gastric body.  Biopsied.  - Z-line irregular.  - Small hiatal hernia.  - Normal esophagus.   Recs: - Await pathology results.  - Return patient to hospital ward for ongoing care - Give feraheme as scheduled and arrange for IV iron as out patient - Resume previous diet today.  - Continue present medications.  - Return to my office in 3 weeks.  - Continue protonix 40mg  BID - Start oral iron 325mg  TID - Miralax for iron induced constipation - Can be discharged home today  [name], MD [address]  [place], [place] [address]  Main: [phone]  Fax: [phone] Pager: [phone]

## 2017-07-30 NOTE — Progress Notes (Signed)
ELECTROLYTE CONSULT NOTE - INITIAL   Pharmacy Consult for electrolyte monitoring/replacement Indication: Hypokalemia  Allergies  Allergen Reactions  . Chantix [Varenicline] Hives, Shortness Of Breath and Palpitations  . Diphenhydramine Hcl Shortness Of Breath  . Pregabalin Other (See Comments)    Falls  . Benadryl [Diphenhydramine Hcl] Rash  . Niacin Rash  . Trazodone Palpitations    Patient Measurements: Height: 5\' 7"  (170.2 cm) Weight: 144 lb (65.3 kg) IBW/kg (Calculated) : 61.6  Labs:  Recent Labs  07/28/17 1239 07/28/17 1347 07/29/17 0406 07/29/17 1811  WBC 8.2  --  7.9  --   HGB 5.8*  --  8.0* 7.7*  HCT 19.9*  --  25.8*  --   PLT 292  --  206  --   APTT  --  34  --   --   CREATININE 0.67  --  0.63  --   MG 1.8  --  2.3  --    Estimated Creatinine Clearance: 72.7 mL/min (by C-G formula based on SCr of 0.63 mg/dL).   Assessment: Pharmacy consulted to monitor and replace electrolytes in this 61 year old female with hypokalemia on admission. Patient currently ordered potassium 20 mEq PO Daily.    Goal of Therapy: K=4-5.1 Mg= 2-2.4   Plan:  No replacement warranted at this time.   Pharmacy will continue to monitor and adjust per consult.   Verenise Moulin L 07/30/2017,11:55 AM

## 2017-07-30 NOTE — Progress Notes (Signed)
Pt received one unit of packed red blood cells. No reaction noted, patient tolerated blood transfusion. Order placed per MD order for hemoglobin one hour post transfusion. If greater than 7.7 per MD, Pt will be discharged. I will continue to assess.

## 2017-07-30 NOTE — Anesthesia Post-op Follow-up Note (Signed)
Anesthesia QCDR form completed.        

## 2017-07-30 NOTE — Discharge Summary (Signed)
[facility] -  at [facility]   PATIENT NAME: Traci Duran    MR#:  [id number]  DATE OF BIRTH:  1956-04-30  DATE OF ADMISSION:  07/28/2017 ADMITTING PHYSICIAN: Milagros Loll, MD  DATE OF DISCHARGE:   PRIMARY CARE PHYSICIAN: [place], [facility]    ADMISSION DIAGNOSIS:  NSTEMI (non-ST elevated myocardial infarction) (HCC) [I21.4]  DISCHARGE DIAGNOSIS:  Acute on Chronic GI bleed due to Gastritis Iron deficiency anemia Vitamin B12 deficiency History of coronary artery disease with recent non-STEMI  SECONDARY DIAGNOSIS:   Past Medical History:  Diagnosis Date  . Anxiety   . COPD (chronic obstructive pulmonary disease) (HCC)   . Coronary artery disease   . Depression   . Gastritis   . GERD (gastroesophageal reflux disease)   . Hemorrhoids   . Hyperlipidemia   . Hypertension   . PAD (peripheral artery disease) [facility])     HOSPITAL COURSE:   [name] a 61 y.o.femalewith a known history of Peripheral arterial disease, CAD, hypertension, gastritis, colon polyps, COPD, tobacco abuse presents to the emergency room complaining of one week off chest pain, diaphoresis and shortness of breath. She has been getting extremely fatigued with minimal activity. She does have claudication pains in both her legs chronically. On arrival to emergency room patient was found to have a troponin elevated at 1. Hemoglobin 5.8. Stool positive for occult blood.  * GI bleed, likely upper with acute on chronic blood loss anemia -Stool positive for melena. -Came in with hemoglobin of 5.8 --- transfused 2 units packed RBC --- 8.0 --7.7--1 unit today -Patient's hemoglobin is anywhere from 8.3-9.5  -EGD showed gastritis - Cardiology seen patient okay for GI evaluation. -Received IV Protonix drip --- by mouth Protonix twice a day -Patient advised not to take NSAIDs for her pain -She is asked to hold her aspirin for 1 week and resume it thereafter -Given  her recent non-STEMI I will continue Plavix  * Elevated troponin likely demand ischemia  - patient cannot be on any aspirin or Plavix or heparin drip due to acute GI bleed. Discussed with Dr.gollan of cardiology. Appreciate input. Patient okay from cardiology standpoint for GI procedure  -Patient denies any chest pain -Nitroglycerin when necessary  *Iron deficiency anemia/vitamin B12 deficiency -Patient received a few doses of IV Feraheme, started on oral iron -Started on oral vitamin B12 -She will follow up with hematology as outpatient. Appointment already made.  * COPD. Nebulizers when necessary and home inhalers.  * Hypertension. Continue home medications.  * DVT prophylaxis with SCDs  *Constipation chronic -Recommend Dulcolax suppository and bowel regimen on a regular basis  We'll transfuse patient 1 more unit of blood. She can discharge thereafter. CONSULTS OBTAINED:  Treatment Team:  Yvonne Kendall, MD Toney Reil, MD  DRUG ALLERGIES:   Allergies  Allergen Reactions  . Chantix [Varenicline] Hives, Shortness Of Breath and Palpitations  . Diphenhydramine Hcl Shortness Of Breath  . Pregabalin Other (See Comments)    Falls  . Benadryl [Diphenhydramine Hcl] Rash  . Niacin Rash  . Trazodone Palpitations    DISCHARGE MEDICATIONS:   Current Discharge Medication List    START taking these medications   Details  docusate sodium (COLACE) 100 MG capsule Take 1 capsule (100 mg total) by mouth 2 (two) times daily. Qty: 10 capsule, Refills: 0    ferrous sulfate 325 (65 FE) MG tablet Take 1 tablet (325 mg total) by mouth 3 (three) times daily with meals. Qty: 90 tablet, Refills:  3    polyethylene glycol (MIRALAX / GLYCOLAX) packet Take 17 g by mouth daily. Qty: 14 each, Refills: 0    traMADol (ULTRAM) 50 MG tablet Take 1 tablet (50 mg total) by mouth every 6 (six) hours as needed. Qty: 15 tablet, Refills: 0    vitamin B-12 1000 MCG tablet Take 1 tablet  (1,000 mcg total) by mouth daily. Qty: 30 tablet, Refills: 3      CONTINUE these medications which have CHANGED   Details  aspirin EC 81 MG tablet Take 1 tablet (81 mg total) by mouth daily. Qty: 30 tablet, Refills: 0    pantoprazole (PROTONIX) 40 MG tablet Take 1 tablet (40 mg total) by mouth 2 (two) times daily before a meal. Qty: 30 tablet, Refills: 2      CONTINUE these medications which have NOT CHANGED   Details  ALPRAZolam (XANAX) 1 MG tablet Take 1 mg by mouth 3 (three) times daily as needed. For anxiety     atorvastatin (LIPITOR) 80 MG tablet Take 80 mg by mouth daily.    busPIRone (BUSPAR) 10 MG tablet Take 10 mg by mouth daily.  Refills: 2    carvedilol (COREG) 6.25 MG tablet Take 6.25 mg by mouth 2 (two) times daily.    citalopram (CELEXA) 40 MG tablet Take 40 mg by mouth daily.    clopidogrel (PLAVIX) 75 MG tablet Take 75 mg by mouth daily.    DULoxetine (CYMBALTA) 30 MG capsule Take 30 mg by mouth daily.    gabapentin (NEURONTIN) 300 MG capsule Take 1 capsule by mouth 4 (four) times daily.     lisinopril (PRINIVIL,ZESTRIL) 20 MG tablet Take 20 mg by mouth daily.      nicotine (NICODERM CQ - DOSED IN MG/24 HOURS) 21 mg/24hr patch Place 1 patch (21 mg total) onto the skin daily. Qty: 28 patch, Refills: 0    nitroGLYCERIN (NITROSTAT) 0.4 MG SL tablet Place under the tongue.    potassium chloride (K-DUR) 10 MEQ tablet Take 1 tablet (10 mEq total) by mouth daily. Qty: 7 tablet, Refills: 0    Vitamin D, Ergocalciferol, (DRISDOL) 50000 units CAPS capsule TAKE 1 CAPSULE  50,000 UNITS TOTAL  BY MOUTH ONCE A WEEK Refills: 3        If you experience worsening of your admission symptoms, develop shortness of breath, life threatening emergency, suicidal or homicidal thoughts you must seek medical attention immediately by calling 911 or calling your MD immediately  if symptoms less severe.  You Must read complete instructions/literature along with all the possible  adverse reactions/side effects for all the Medicines you take and that have been prescribed to you. Take any new Medicines after you have completely understood and accept all the possible adverse reactions/side effects.   Please note  You were cared for by a hospitalist during your hospital stay. If you have any questions about your discharge medications or the care you received while you were in the hospital after you are discharged, you can call the unit and asked to speak with the hospitalist on call if the hospitalist that took care of you is not available. Once you are discharged, your primary care physician will handle any further medical issues. Please note that NO REFILLS for any discharge medications will be authorized once you are discharged, as it is imperative that you return to your primary care physician (or establish a relationship with a primary care physician if you do not have one) for your aftercare needs so  that they can reassess your need for medications and monitor your lab values. Today   SUBJECTIVE   Doing well. Denies any complaints today  VITAL SIGNS:  Blood pressure (!) 148/88, pulse 93, temperature 98.6 F (37 C), temperature source Oral, resp. rate 20, height 5\' 7"  (1.702 m), weight 65.3 kg (144 lb), SpO2 95 %.  I/O:   Intake/Output Summary (Last 24 hours) at 07/30/17 1202 Last data filed at 07/30/17 1017  Gross per 24 hour  Intake             1420 ml  Output              150 ml  Net             1270 ml    PHYSICAL EXAMINATION:  GENERAL:  61 y.o.-year-old patient lying in the bed with no acute distress.  EYES: Pupils equal, round, reactive to light and accommodation. No scleral icterus. Extraocular muscles intact.  HEENT: Head atraumatic, normocephalic. Oropharynx and nasopharynx clear.  NECK:  Supple, no jugular venous distention. No thyroid enlargement, no tenderness.  LUNGS: Normal breath sounds bilaterally, no wheezing, rales,rhonchi or crepitation. No  use of accessory muscles of respiration.  CARDIOVASCULAR: S1, S2 normal. No murmurs, rubs, or gallops.  ABDOMEN: Soft, non-tender, non-distended. Bowel sounds present. No organomegaly or mass.  EXTREMITIES: No pedal edema, cyanosis, or clubbing.  NEUROLOGIC: Cranial nerves II through XII are intact. Muscle strength 5/5 in all extremities. Sensation intact. Gait not checked.  PSYCHIATRIC: The patient is alert and oriented x 3.  SKIN: No obvious rash, lesion, or ulcer.   DATA REVIEW:   CBC   Recent Labs Lab 07/29/17 0406 07/29/17 1811  WBC 7.9  --   HGB 8.0* 7.7*  HCT 25.8*  --   PLT 206  --     Chemistries   Recent Labs Lab 07/29/17 0406 07/30/17 0437  NA 138  --   K 3.7 4.2  CL 108  --   CO2 25  --   GLUCOSE 95  --   BUN 10  --   CREATININE 0.63  --   CALCIUM 8.0*  --   MG 2.3  --     Microbiology Results   No results found for this or any previous visit (from the past 240 hour(s)).  RADIOLOGY:  Dg Chest 2 View  Result Date: 07/28/2017 CLINICAL DATA:  Chest pain for 1 week. EXAM: CHEST  2 VIEW COMPARISON:  Single-view of the chest 04/06/2017. PA and lateral chest 01/16/2017. FINDINGS: The lungs are clear. Heart size is upper normal. No pneumothorax or pleural fluid. No acute bony abnormality. The patient has chronic upper and mid to lower thoracic spine compression fractures, unchanged. IMPRESSION: No acute disease. Electronically Signed   By: Drusilla Kanner M.D.   On: 07/28/2017 13:13     Management plans discussed with the patient, family and they are in agreement.  CODE STATUS:     Code Status Orders        Start     Ordered   07/28/17 1411  Full code  Continuous     07/28/17 1413    Code Status History    Date Active Date Inactive Code Status Order ID Comments User Context   04/07/2017 12:22 AM 04/09/2017  5:10 PM Full Code [id number]  Ihor Austin, MD Inpatient   10/14/2011 10:15 PM 10/15/2011  5:27 PM Full Code [id number]  Mays, Joyice Faster, RN  Inpatient  TOTAL TIME TAKING CARE OF THIS PATIENT: 40 minutes.    Yaeko Fazekas M.D on 07/30/2017 at 12:02 PM  Between 7am to 6pm - Pager - [phone] After 6pm go to [email or web address] - Social research officer, government  [facility]  Office  [phone]  CC: Primary care physician; [name]

## 2017-07-30 NOTE — Anesthesia Postprocedure Evaluation (Signed)
Anesthesia Post Note  Patient: Traci Duran  Procedure(s) Performed: Procedure(s) (LRB): ESOPHAGOGASTRODUODENOSCOPY (EGD) (N/A)  Patient location during evaluation: Endoscopy Anesthesia Type: General Level of consciousness: awake and alert Pain management: pain level controlled Vital Signs Assessment: post-procedure vital signs reviewed and stable Respiratory status: spontaneous breathing, nonlabored ventilation, respiratory function stable and patient connected to nasal cannula oxygen Cardiovascular status: blood pressure returned to baseline and stable Postop Assessment: no signs of nausea or vomiting Anesthetic complications: no     Last Vitals:  Vitals:   07/30/17 0858 07/30/17 1125  BP: (!) 150/78 (!) 148/88  Pulse: 85 93  Resp:  20  Temp: 36.5 C 37 C  SpO2: 97% 95%    Last Pain:  Vitals:   07/30/17 1125  TempSrc: Oral  PainSc:                  Martha Clan

## 2017-07-30 NOTE — Transfer of Care (Signed)
Immediate Anesthesia Transfer of Care Note  Patient: Traci Duran  Procedure(s) Performed: Procedure(s): ESOPHAGOGASTRODUODENOSCOPY (EGD) (N/A)  Patient Location: Endoscopy Unit  Anesthesia Type:General  Level of Consciousness: awake  Airway & Oxygen Therapy: Patient Spontanous Breathing and Patient connected to nasal cannula oxygen  Post-op Assessment: Report given to RN and Post -op Vital signs reviewed and stable  Post vital signs: Reviewed  Last Vitals:  Vitals:   07/30/17 0734 07/30/17 0800  BP: (P) 102/64 102/64  Pulse: (P) 84 84  Resp: (P) 17 16  Temp: (!) (P) 35.6 C (!) 36.2 C  SpO2: (P) 100% 94%    Last Pain:  Vitals:   07/30/17 0734  TempSrc: (P) Tympanic  PainSc:       Patients Stated Pain Goal: 2 (67/59/16 3846)  Complications: No apparent anesthesia complications

## 2017-07-30 NOTE — Op Note (Signed)
[facility] Gastroenterology Patient Name: Traci Duran Procedure Date: 07/30/2017 7:39 AM MRN: [id number] Account #: [id number] Date of Birth: 12/31/1955 Admit Type: Inpatient Age: 61 Room: [facility] ENDO ROOM 4 Gender: Female Note Status: Finalized Procedure:            Upper GI endoscopy Indications:          Iron deficiency anemia due to suspected upper                        gastrointestinal bleeding, Melena Providers:            Toney Reil MD, MD Referring MD:         No Local Md, MD (Referring MD) Medicines:            Monitored Anesthesia Care Complications:        No immediate complications. Estimated blood loss:                        Minimal. Procedure:            Pre-Anesthesia Assessment:                       - Prior to the procedure, a History and Physical was                        performed, and patient medications and allergies were                        reviewed. The patient is competent. The risks and                        benefits of the procedure and the sedation options and                        risks were discussed with the patient. All questions                        were answered and informed consent was obtained.                        Patient identification and proposed procedure were                        verified by the physician, the nurse, the                        anesthesiologist, the anesthetist and the technician in                        the pre-procedure area in the procedure room. Mental                        Status Examination: alert and oriented. Airway                        Examination: normal oropharyngeal airway and neck                        mobility. Respiratory Examination: clear to  auscultation. CV Examination: normal. Prophylactic                        Antibiotics: The patient does not require prophylactic                        antibiotics. Prior Anticoagulants: The patient  has                        taken no previous anticoagulant or antiplatelet agents.                        ASA Grade Assessment: III - A patient with severe                        systemic disease. After reviewing the risks and                        benefits, the patient was deemed in satisfactory                        condition to undergo the procedure. The anesthesia plan                        was to use monitored anesthesia care (MAC). Immediately                        prior to administration of medications, the patient was                        re-assessed for adequacy to receive sedatives. The                        heart rate, respiratory rate, oxygen saturations, blood                        pressure, adequacy of pulmonary ventilation, and                        response to care were monitored throughout the                        procedure. The physical status of the patient was                        re-assessed after the procedure.                       After obtaining informed consent, the endoscope was                        passed under direct vision. Throughout the procedure,                        the patient's blood pressure, pulse, and oxygen                        saturations were monitored continuously. The Endoscope                        was introduced  through the mouth, and advanced to the                        third part of duodenum. The upper GI endoscopy was                        accomplished without difficulty. The patient tolerated                        the procedure well. Findings:      The duodenal bulb, second portion of the duodenum and third portion of       the duodenum were normal. Biopsies for histology were taken with a cold       forceps for evaluation of celiac disease.      A few localized, diminutive non-bleeding erosions were found in the       gastric antrum. There were no stigmata of recent bleeding.      Diffuse mildly erythematous mucosa  without bleeding was found in the       gastric body. Biopsies were taken with a cold forceps for Helicobacter       pylori testing.      The Z-line was irregular.      A small hiatal hernia was present.      The examined esophagus was normal. Impression:           - Normal duodenal bulb, second portion of the duodenum                        and third portion of the duodenum. Biopsied.                       - Non-bleeding erosive gastropathy.                       - Erythematous mucosa in the gastric body. Biopsied.                       - Z-line irregular.                       - Small hiatal hernia.                       - Normal esophagus. Recommendation:       - Await pathology results.                       - Return patient to hospital ward for ongoing care.                       - Resume previous diet today.                       - Continue present medications.                       - Return to my office in 3 weeks.                       - Continue protonix 40mg  BID                       -  Start oral iron 325mg  TID                       - Miralax for iron induced constipation Procedure Code(s):    --- Professional ---                       [id number], Esophagogastroduodenoscopy, flexible, transoral;                        with biopsy, single or multiple Diagnosis Code(s):    --- Professional ---                       K31.89, Other diseases of stomach and duodenum                       K22.8, Other specified diseases of esophagus                       K44.9, Diaphragmatic hernia without obstruction or                        gangrene                       D50.9, Iron deficiency anemia, unspecified                       K92.1, Melena (includes Hematochezia) CPT copyright 2016 American Medical Association. All rights reserved. The codes documented in this report are preliminary and upon coder review may  be revised to meet current compliance requirements. Dr. Libby Maw Toney Reil MD, MD 07/30/2017 8:08:51 AM This report has been signed electronically. Number of Addenda: 0 Note Initiated On: 07/30/2017 7:39 AM      [facility]

## 2017-07-30 NOTE — H&P (Signed)
[name], MD [address]  [place]  [place], [place] [address]  Main: [phone]  Fax: [phone] Pager: [phone]  Primary Care Physician:  [facility] Primary Gastroenterologist:  Dr. [name]  Pre-Procedure History & Physical: HPI:  Traci Duran is a 61 y.o. female is here for an endoscopy.   Past Medical History:  Diagnosis Date  . Anxiety   . COPD (chronic obstructive pulmonary disease) ([place])   . Coronary artery disease   . Depression   . Gastritis   . GERD (gastroesophageal reflux disease)   . Hemorrhoids   . Hyperlipidemia   . Hypertension   . PAD (peripheral artery disease) ([place])     Past Surgical History:  Procedure Laterality Date  . APPENDECTOMY    . COLONOSCOPY WITH PROPOFOL N/A 06/09/2017   Procedure: COLONOSCOPY WITH PROPOFOL;  Surgeon: Lucilla Lame, MD;  Location: [facility] ENDOSCOPY;  Service: Endoscopy;  Laterality: N/A;  . ESOPHAGOGASTRODUODENOSCOPY (EGD) WITH PROPOFOL N/A 04/09/2017   Procedure: ESOPHAGOGASTRODUODENOSCOPY (EGD) WITH PROPOFOL;  Surgeon: Lucilla Lame, MD;  Location: [facility] ENDOSCOPY;  Service: Endoscopy;  Laterality: N/A;  . HIP FRACTURE SURGERY      Prior to Admission medications   Medication Sig Start Date End Date Taking? Authorizing Provider  ALPRAZolam Duanne Moron) 1 MG tablet Take 1 mg by mouth 3 (three) times daily as needed. For anxiety    Yes [provider]  aspirin EC 81 MG tablet Take 81 mg by mouth daily.   Yes [provider]  atorvastatin (LIPITOR) 80 MG tablet Take 80 mg by mouth daily.   Yes [provider]  busPIRone (BUSPAR) 10 MG tablet Take 10 mg by mouth daily.  04/30/17  Yes [provider]  carvedilol (COREG) 6.25 MG tablet Take 6.25 mg by mouth 2 (two) times daily. 05/28/17  Yes [provider]  citalopram (CELEXA) 40 MG tablet Take 40 mg by mouth daily.   Yes [provider]  clopidogrel (PLAVIX) 75 MG tablet Take 75 mg by mouth  daily.   Yes [provider]  DULoxetine (CYMBALTA) 30 MG capsule Take 30 mg by mouth daily. 05/28/17  Yes [provider]  gabapentin (NEURONTIN) 300 MG capsule Take 1 capsule by mouth 4 (four) times daily.  04/06/17  Yes [provider]  lisinopril (PRINIVIL,ZESTRIL) 20 MG tablet Take 20 mg by mouth daily.     Yes [provider]  nicotine (NICODERM CQ - DOSED IN MG/24 HOURS) 21 mg/24hr patch Place 1 patch (21 mg total) onto the skin daily. 04/10/17  Yes Mody, Ulice Bold, MD  nitroGLYCERIN (NITROSTAT) 0.4 MG SL tablet Place under the tongue. 01/24/16  Yes [provider]  pantoprazole (PROTONIX) 40 MG tablet Take 1 tablet (40 mg total) by mouth daily. Patient taking differently: Take 20 mg by mouth daily.  04/09/17  Yes Mody, Ulice Bold, MD  potassium chloride (K-DUR) 10 MEQ tablet Take 1 tablet (10 mEq total) by mouth daily. 05/28/17  Yes Earleen Newport, MD  Vitamin D, Ergocalciferol, (DRISDOL) 50000 units CAPS capsule TAKE 1 CAPSULE  50,000 UNITS TOTAL  BY MOUTH ONCE A WEEK 05/01/17  Yes [provider]    Allergies as of 07/28/2017 - Review Complete 07/28/2017  Allergen Reaction Noted  . Chantix [varenicline] Hives, Shortness Of Breath, and Palpitations 09/06/2012  . Diphenhydramine hcl Shortness Of Breath   . Pregabalin Other (See Comments) 04/24/2016  . Benadryl [diphenhydramine hcl] Rash 10/14/2011  . Niacin Rash   .  Trazodone Palpitations 05/23/2015    Family History  Problem Relation Age of Onset  . Heart failure Mother   . Heart attack Mother        mother died at 77 of an MI  . Heart attack Father        father died at 26 of an MI  . Heart attack Brother        Brother had an MI in his 70s    Social History   Social History  . Marital status: Widowed    Spouse name: N/A  . Number of children: N/A  . Years of education: N/A   Occupational History  . Not on file.   Social History Main Topics  . Smoking status: Current  Some Day Smoker    Packs/day: 0.50    Years: 20.00    Types: Cigarettes  . Smokeless tobacco: Never Used  . Alcohol use No  . Drug use: Yes    Types: Marijuana     Comment: 1 week ago Marijuana.   . Sexual activity: Not Currently   Other Topics Concern  . Not on file   Social History Narrative  . No narrative on file    Review of Systems: See HPI, otherwise negative ROS  Physical Exam: BP 115/70 (BP Location: Right Arm)   Pulse 73   Temp 97.8 F (36.6 C) (Oral)   Resp 18   Ht 5\' 7"  (1.702 m)   Wt 65.3 kg (144 lb)   SpO2 100%   BMI 22.55 kg/m  General:   Alert,  pleasant and cooperative in NAD Head:  Normocephalic and atraumatic. Neck:  Supple; no masses or thyromegaly. Lungs:  Clear throughout to auscultation.    Heart:  Regular rate and rhythm. Abdomen:  Soft, nontender and nondistended. Normal bowel sounds, without guarding, and without rebound.   Neurologic:  Alert and  oriented x4;  grossly normal neurologically.  Impression/Plan: Traci Duran is here for an endoscopy to be performed for dark stools  Risks, benefits, limitations, and alternatives regarding  endoscopy have been reviewed with the patient.  Questions have been answered.  All parties agreeable.   Sherri Sear, MD  07/30/2017, 7:58 AM

## 2017-07-30 NOTE — Plan of Care (Signed)
Problem: Pain Managment: Goal: General experience of comfort will improve Outcome: Progressing I will assess pain every four hours and administered prn pain medication as needed.

## 2017-07-30 NOTE — Anesthesia Preprocedure Evaluation (Signed)
Anesthesia Evaluation  Patient identified by MRN, date of birth, ID band Patient awake    Reviewed: Allergy & Precautions, H&P , NPO status , Patient's Chart, lab work & pertinent test results, reviewed documented beta blocker date and time   History of Anesthesia Complications Negative for: history of anesthetic complications  Airway Mallampati: III  TM Distance: >3 FB Neck ROM: full    Dental  (+) Chipped, Missing, Poor Dentition, Dental Advidsory Given   Pulmonary shortness of breath and with exertion, asthma , neg sleep apnea, COPD,  COPD inhaler, neg recent URI, Current Smoker,           Cardiovascular Exercise Tolerance: Good hypertension, + angina (stable CAD, unstentable currently, medical management) + CAD and + Peripheral Vascular Disease  (-) Past MI, (-) Cardiac Stents and (-) CABG (-) dysrhythmias + Valvular Problems/Murmurs      Neuro/Psych neg Seizures PSYCHIATRIC DISORDERS (Depression) CVA, No Residual Symptoms    GI/Hepatic Neg liver ROS, GERD  ,  Endo/Other  negative endocrine ROS  Renal/GU Renal disease (Renal artery stenosis)  negative genitourinary   Musculoskeletal   Abdominal   Peds  Hematology  (+) Blood dyscrasia, anemia ,   Anesthesia Other Findings Past Medical History: No date: Anxiety No date: COPD (chronic obstructive pulmonary disease) ([facility]) No date: Coronary artery disease No date: Depression No date: Gastritis No date: GERD (gastroesophageal reflux disease) No date: Hemorrhoids No date: Hyperlipidemia No date: Hypertension No date: PAD (peripheral artery disease) ([facility])   Reproductive/Obstetrics negative OB ROS                             Anesthesia Physical Anesthesia Plan  ASA: III  Anesthesia Plan: General   Post-op Pain Management:    Induction: Intravenous  PONV Risk Score and Plan: 2 and Propofol infusion  Airway Management Planned:  Natural Airway and Nasal Cannula  Additional Equipment:   Intra-op Plan:   Post-operative Plan:   Informed Consent: I have reviewed the patients History and Physical, chart, labs and discussed the procedure including the risks, benefits and alternatives for the proposed anesthesia with the patient or authorized representative who has indicated his/her understanding and acceptance.   Dental Advisory Given  Plan Discussed with: Anesthesiologist, CRNA and Surgeon  Anesthesia Plan Comments:         Anesthesia Quick Evaluation

## 2017-07-31 ENCOUNTER — Encounter: Payer: Self-pay | Admitting: Gastroenterology

## 2017-07-31 LAB — SURGICAL PATHOLOGY

## 2017-08-01 LAB — BPAM RBC
Blood Product Expiration Date: 201809092359
Blood Product Expiration Date: 201809122359
Blood Product Expiration Date: 201809222359
ISSUE DATE / TIME: 201808211751
ISSUE DATE / TIME: 201808231431
ISSUE DATE / TIME: 201808212238
UNIT TYPE AND RH: 5100
Unit Type and Rh: 5100
Unit Type and Rh: 5100

## 2017-08-01 LAB — TYPE AND SCREEN
ABO/RH(D): O POS
Antibody Screen: NEGATIVE
UNIT DIVISION: 0
Unit division: 0
Unit division: 0

## 2017-08-03 ENCOUNTER — Telehealth: Payer: Self-pay

## 2017-08-03 ENCOUNTER — Other Ambulatory Visit: Payer: Self-pay

## 2017-08-03 NOTE — Telephone Encounter (Signed)
Patient has been notified her EGD is normal.  She said she was told she had some bleeding and wants to know where the bleeding is coming from.  Please advise.

## 2017-08-06 ENCOUNTER — Telehealth: Payer: Self-pay

## 2017-08-06 NOTE — Telephone Encounter (Signed)
Patient has been notified she did not have any bleeding on EGD exam. She may had small ulcer  that may have bled but nothing seen on EGD exam. She just needs to get her iron levels up. She will pick up iron supplements from drug store has follow up with [place] Sept 7th.

## 2017-08-14 ENCOUNTER — Inpatient Hospital Stay: Payer: Medicare Other | Admitting: Oncology

## 2017-08-19 ENCOUNTER — Inpatient Hospital Stay: Payer: Medicare Other | Admitting: Oncology

## 2017-08-27 ENCOUNTER — Encounter: Payer: Self-pay | Admitting: Oncology

## 2017-08-27 ENCOUNTER — Telehealth: Payer: Self-pay | Admitting: *Deleted

## 2017-08-27 ENCOUNTER — Telehealth: Payer: Self-pay | Admitting: Oncology

## 2017-08-27 ENCOUNTER — Inpatient Hospital Stay: Payer: Medicare Other

## 2017-08-27 ENCOUNTER — Inpatient Hospital Stay: Payer: Medicare Other | Attending: Oncology | Admitting: Oncology

## 2017-08-27 VITALS — BP 131/85 | HR 76 | Temp 98.1°F | Resp 18 | Ht 67.0 in | Wt 142.2 lb

## 2017-08-27 DIAGNOSIS — I739 Peripheral vascular disease, unspecified: Secondary | ICD-10-CM | POA: Diagnosis not present

## 2017-08-27 DIAGNOSIS — D500 Iron deficiency anemia secondary to blood loss (chronic): Secondary | ICD-10-CM

## 2017-08-27 DIAGNOSIS — E785 Hyperlipidemia, unspecified: Secondary | ICD-10-CM | POA: Diagnosis not present

## 2017-08-27 DIAGNOSIS — M8580 Other specified disorders of bone density and structure, unspecified site: Secondary | ICD-10-CM | POA: Insufficient documentation

## 2017-08-27 DIAGNOSIS — Z8719 Personal history of other diseases of the digestive system: Secondary | ICD-10-CM | POA: Insufficient documentation

## 2017-08-27 DIAGNOSIS — Z8781 Personal history of (healed) traumatic fracture: Secondary | ICD-10-CM | POA: Insufficient documentation

## 2017-08-27 DIAGNOSIS — R918 Other nonspecific abnormal finding of lung field: Secondary | ICD-10-CM | POA: Insufficient documentation

## 2017-08-27 DIAGNOSIS — R5383 Other fatigue: Secondary | ICD-10-CM

## 2017-08-27 DIAGNOSIS — E041 Nontoxic single thyroid nodule: Secondary | ICD-10-CM | POA: Diagnosis not present

## 2017-08-27 DIAGNOSIS — I10 Essential (primary) hypertension: Secondary | ICD-10-CM | POA: Diagnosis not present

## 2017-08-27 DIAGNOSIS — R531 Weakness: Secondary | ICD-10-CM | POA: Diagnosis not present

## 2017-08-27 DIAGNOSIS — Z8673 Personal history of transient ischemic attack (TIA), and cerebral infarction without residual deficits: Secondary | ICD-10-CM | POA: Diagnosis not present

## 2017-08-27 DIAGNOSIS — Z86010 Personal history of colonic polyps: Secondary | ICD-10-CM | POA: Insufficient documentation

## 2017-08-27 DIAGNOSIS — M79606 Pain in leg, unspecified: Secondary | ICD-10-CM

## 2017-08-27 DIAGNOSIS — I701 Atherosclerosis of renal artery: Secondary | ICD-10-CM | POA: Diagnosis not present

## 2017-08-27 DIAGNOSIS — D509 Iron deficiency anemia, unspecified: Secondary | ICD-10-CM | POA: Insufficient documentation

## 2017-08-27 DIAGNOSIS — I252 Old myocardial infarction: Secondary | ICD-10-CM

## 2017-08-27 DIAGNOSIS — I25110 Atherosclerotic heart disease of native coronary artery with unstable angina pectoris: Secondary | ICD-10-CM | POA: Insufficient documentation

## 2017-08-27 DIAGNOSIS — F17210 Nicotine dependence, cigarettes, uncomplicated: Secondary | ICD-10-CM | POA: Diagnosis not present

## 2017-08-27 DIAGNOSIS — I998 Other disorder of circulatory system: Secondary | ICD-10-CM | POA: Diagnosis not present

## 2017-08-27 DIAGNOSIS — J449 Chronic obstructive pulmonary disease, unspecified: Secondary | ICD-10-CM | POA: Diagnosis not present

## 2017-08-27 DIAGNOSIS — K219 Gastro-esophageal reflux disease without esophagitis: Secondary | ICD-10-CM | POA: Insufficient documentation

## 2017-08-27 DIAGNOSIS — E559 Vitamin D deficiency, unspecified: Secondary | ICD-10-CM | POA: Diagnosis not present

## 2017-08-27 DIAGNOSIS — F329 Major depressive disorder, single episode, unspecified: Secondary | ICD-10-CM | POA: Diagnosis not present

## 2017-08-27 DIAGNOSIS — F419 Anxiety disorder, unspecified: Secondary | ICD-10-CM | POA: Diagnosis not present

## 2017-08-27 DIAGNOSIS — Z79899 Other long term (current) drug therapy: Secondary | ICD-10-CM

## 2017-08-27 DIAGNOSIS — Z9181 History of falling: Secondary | ICD-10-CM | POA: Insufficient documentation

## 2017-08-27 LAB — CBC WITH DIFFERENTIAL/PLATELET
BASOS PCT: 1 %
Basophils Absolute: 0.1 10*3/uL (ref 0–0.1)
Eosinophils Absolute: 0.2 10*3/uL (ref 0–0.7)
Eosinophils Relative: 2 %
HEMATOCRIT: 38.1 % (ref 35.0–47.0)
Hemoglobin: 12.9 g/dL (ref 12.0–16.0)
Lymphocytes Relative: 32 %
Lymphs Abs: 2.6 10*3/uL (ref 1.0–3.6)
MCH: 26.7 pg (ref 26.0–34.0)
MCHC: 33.7 g/dL (ref 32.0–36.0)
MCV: 79.1 fL — ABNORMAL LOW (ref 80.0–100.0)
MONO ABS: 0.5 10*3/uL (ref 0.2–0.9)
MONOS PCT: 6 %
Neutro Abs: 4.9 10*3/uL (ref 1.4–6.5)
Neutrophils Relative %: 59 %
Platelets: 191 10*3/uL (ref 150–440)
RBC: 4.82 MIL/uL (ref 3.80–5.20)
RDW: 30 % — AB (ref 11.5–14.5)
WBC: 8.2 10*3/uL (ref 3.6–11.0)

## 2017-08-27 LAB — IRON AND TIBC
Iron: 78 ug/dL (ref 28–170)
Saturation Ratios: 24 % (ref 10.4–31.8)
TIBC: 320 ug/dL (ref 250–450)
UIBC: 243 ug/dL

## 2017-08-27 LAB — URINALYSIS, COMPLETE (UACMP) WITH MICROSCOPIC
Bilirubin Urine: NEGATIVE
GLUCOSE, UA: NEGATIVE mg/dL
Hgb urine dipstick: NEGATIVE
Ketones, ur: NEGATIVE mg/dL
LEUKOCYTES UA: NEGATIVE
Nitrite: NEGATIVE
Protein, ur: NEGATIVE mg/dL
Specific Gravity, Urine: 1.004 — ABNORMAL LOW (ref 1.005–1.030)
pH: 6 (ref 5.0–8.0)

## 2017-08-27 LAB — FERRITIN: FERRITIN: 41 ng/mL (ref 11–307)

## 2017-08-27 LAB — VITAMIN B12: VITAMIN B 12: 409 pg/mL (ref 180–914)

## 2017-08-27 NOTE — Progress Notes (Signed)
Hematology/Oncology Consult note [facility] Telephone:([phone] [phone]  Patient Care Team: [name] as PCP - General   Name of the patient: Traci Duran  [id number]  1956/10/09    Reason for referral- iron deficiency anemia   Referring physician- [facility]  Date of visit: 08/27/17   History of presenting illness- patient is a 61 year old female who was recently admitted to the hospital in August 2018 with a hemoglobin of 5.8. She presented at that time with symptoms of shortness of breath and chest pain. Her baseline hemoglobin is around 12. She denies taking any NSAIDs and regular basis. She had EGD in May 2018 which showed gastritis and colonoscopy in July 2018 showed hyperplastic polyp and tubular adenoma negative for high-grade dysplasia and malignancy. Repeat EGD in August 2018 showed nonbleeding erosions in the gastric antrum with no recent stigmata of bleeding. During that hospitalization she was given 2 units of PRBC. She is also on Plavix for her recent non-STEMI. Iron studies in August 2018 showed a low iron saturation of 4% and low serum iron of 13. Ferritin was low at 4. B12 was low normal at 247. Folate is normal. No evidence of H pylori on biopsy  She is currently coping with the death of her second daughter. Continues to smoke 1/2-1 ppd and has been doing so over 30 years. She is on nicotine patches. Reports some fatigue and leg pain  ECOG PS- 0  Pain scale- 0   Review of systems- Review of Systems  Constitutional: Positive for malaise/fatigue. Negative for chills, fever and weight loss.  HENT: Negative for congestion, ear discharge and nosebleeds.   Eyes: Negative for blurred vision.  Respiratory: Negative for cough, hemoptysis, sputum production, shortness of breath and wheezing.   Cardiovascular: Negative for chest pain, palpitations, orthopnea and claudication.  Gastrointestinal: Negative  for abdominal pain, blood in stool, constipation, diarrhea, heartburn, melena, nausea and vomiting.  Genitourinary: Negative for dysuria, flank pain, frequency, hematuria and urgency.  Musculoskeletal: Negative for back pain, joint pain and myalgias.  Skin: Negative for rash.  Neurological: Negative for dizziness, tingling, focal weakness, seizures, weakness and headaches.  Endo/Heme/Allergies: Does not bruise/bleed easily.  Psychiatric/Behavioral: Negative for depression and suicidal ideas. The patient does not have insomnia.     Allergies  Allergen Reactions  . Chantix [Varenicline] Hives, Shortness Of Breath and Palpitations  . Diphenhydramine Hcl Shortness Of Breath  . Pregabalin Other (See Comments)    Falls  . Benadryl [Diphenhydramine Hcl] Rash  . Niacin Rash  . Trazodone Palpitations    Patient Active Problem List   Diagnosis Date Noted  . Chest pain 07/28/2017  . Acute blood loss anemia 07/28/2017  . Demand ischemia (HCC)   . Benign neoplasm of descending colon   . Polyp of sigmoid colon   . Age related osteoporosis 04/16/2017  . Iron deficiency anemia secondary to blood loss (chronic)   . GI bleed 04/06/2017  . Diastolic dysfunction 09/30/2016  . Opioid contract exists 06/27/2016  . Incidental lung nodule 01/18/2016  . Traumatic hemorrhage of cerebrum (HCC) 12/04/2015  . Hypovitaminosis D 08/29/2015  . Thyroid lesion 08/29/2015  . Major depressive disorder, single episode, unspecified 08/27/2015  . 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 Gastroesophageal reflux disease 01/26/2015  . Osteopenia 01/26/2015  . Anemia, unspecified 12/27/2014  . History of fall 12/27/2014  . Greater tuberosity of humerus fracture 10/24/2013  . AVF (arteriovenous fistula) (HCC) 11/08/2011  . Anxiety 10/24/2011  .  Cerebrovascular disease 10/24/2011  . Dyslipidemia 10/24/2011  . Hypertension 10/24/2011  . Tobacco abuse 10/24/2011  . Palpitations 10/24/2011  . Renal artery  stenosis (HCC) 10/24/2011  . Unstable angina (HCC) 10/14/2011  . Hyperlipidemia   . Chronic obstructive pulmonary disease (HCC)   . Depression   . Peripheral arterial disease (HCC)   . Coronary artery disease, non-occlusive      Past Medical History:  Diagnosis Date  . Anemia   . Anxiety   . COPD (chronic obstructive pulmonary disease) (HCC)   . Coronary artery disease   . Depression   . Gastritis   . GERD (gastroesophageal reflux disease)   . Hemorrhoids   . Hyperlipidemia   . Hypertension   . PAD (peripheral artery disease) (HCC)      Past Surgical History:  Procedure Laterality Date  . APPENDECTOMY    . COLONOSCOPY WITH PROPOFOL N/A 06/09/2017   Procedure: COLONOSCOPY WITH PROPOFOL;  Surgeon: Midge Minium, MD;  Location: [facility] ENDOSCOPY;  Service: Endoscopy;  Laterality: N/A;  . ESOPHAGOGASTRODUODENOSCOPY N/A 07/30/2017   Procedure: ESOPHAGOGASTRODUODENOSCOPY (EGD);  Surgeon: Toney Reil, MD;  [facility] ENDOSCOPY;  Service: Gastroenterology;  Laterality: N/A;  . ESOPHAGOGASTRODUODENOSCOPY (EGD) WITH PROPOFOL N/A 04/09/2017   Procedure: ESOPHAGOGASTRODUODENOSCOPY (EGD) WITH PROPOFOL;  Surgeon: Midge Minium, MD;  Location: [facility] ENDOSCOPY;  Service: Endoscopy;  Laterality: N/A;  . HIP FRACTURE SURGERY      Social History   Social History  . Marital status: Widowed    Spouse name: N/A  . Number of children: N/A  . Years of education: N/A   Occupational History  . Not on file.   Social History Main Topics  . Smoking status: Current Some Day Smoker    Packs/day: 0.50    Years: 20.00    Types: Cigarettes  . Smokeless tobacco: Never Used     Comment: she feels that the 21 nicotine made her jittery and we suggested a lower level patch  . Alcohol use No  . Drug use: Yes    Types: Marijuana     Comment: once in a while, about 2 weeks ago  . Sexual activity: Not Currently   Other Topics Concern  . Not on file   Social History Narrative  . No narrative on  file     Family History  Problem Relation Age of Onset  . Heart failure Mother   . Heart attack Mother        mother died at 94 of an MI  . Heart attack Father        father died at 92 of an MI  . Heart attack Brother        Brother had an MI in his 47s     Current Outpatient Prescriptions:  .  ALPRAZolam (XANAX) 1 MG tablet, Take 1 mg by mouth 3 (three) times daily as needed. For anxiety , Disp: , Rfl:  .  atorvastatin (LIPITOR) 80 MG tablet, Take 80 mg by mouth daily., Disp: , Rfl:  .  busPIRone (BUSPAR) 10 MG tablet, Take 10 mg by mouth daily. , Disp: , Rfl: 2 .  carvedilol (COREG) 6.25 MG tablet, Take 6.25 mg by mouth 2 (two) times daily., Disp: , Rfl:  .  citalopram (CELEXA) 40 MG tablet, Take 40 mg by mouth daily., Disp: , Rfl:  .  clopidogrel (PLAVIX) 75 MG tablet, Take 75 mg by mouth daily., Disp: , Rfl:  .  DULoxetine (CYMBALTA) 30 MG capsule, Take 30 mg by  mouth daily., Disp: , Rfl:  .  gabapentin (NEURONTIN) 300 MG capsule, Take 1 capsule by mouth 4 (four) times daily. , Disp: , Rfl:  .  lisinopril (PRINIVIL,ZESTRIL) 20 MG tablet, Take 20 mg by mouth daily.  , Disp: , Rfl:  .  Multiple Vitamin (MULTIVITAMIN WITH MINERALS) TABS tablet, Take 2 tablets by mouth daily., Disp: , Rfl:  .  nitroGLYCERIN (NITROSTAT) 0.4 MG SL tablet, Place under the tongue., Disp: , Rfl:  .  pantoprazole (PROTONIX) 40 MG tablet, Take 1 tablet (40 mg total) by mouth 2 (two) times daily before a meal., Disp: 30 tablet, Rfl: 2 .  potassium chloride (K-DUR) 10 MEQ tablet, Take 1 tablet (10 mEq total) by mouth daily., Disp: 7 tablet, Rfl: 0 .  traMADol (ULTRAM) 50 MG tablet, Take 1 tablet (50 mg total) by mouth every 6 (six) hours as needed., Disp: 15 tablet, Rfl: 0 .  Vitamin D, Ergocalciferol, (DRISDOL) 50000 units CAPS capsule, TAKE 1 CAPSULE  50,000 UNITS TOTAL  BY MOUTH ONCE A WEEK, Disp: , Rfl: 3 .  ferrous sulfate 325 (65 FE) MG tablet, Take 1 tablet (325 mg total) by mouth 3 (three) times daily  with meals. (Patient not taking: Reported on 08/27/2017), Disp: 90 tablet, Rfl: 3   Physical exam:  Vitals:   08/27/17 0909  BP: 131/85  Pulse: 76  Resp: 18  Temp: 98.1 F (36.7 C)  TempSrc: Tympanic  Weight: 142 lb 3.2 oz (64.5 kg)  Height: 5\' 7"  (1.702 m)   Physical Exam  Constitutional: She is oriented to person, place, and time and well-developed, well-nourished, and in no distress.  HENT:  Head: Normocephalic and atraumatic.  Eyes: Pupils are equal, round, and reactive to light. EOM are normal.  Neck: Normal range of motion.  Cardiovascular: Normal rate, regular rhythm and normal heart sounds.   Pulmonary/Chest: Effort normal and breath sounds normal.  Abdominal: Soft. Bowel sounds are normal.  Neurological: She is alert and oriented to person, place, and time.  Skin: Skin is warm and dry.       CMP Latest Ref Rng & Units 07/30/2017  Glucose 65 - 99 mg/dL -  BUN 6 - 20 mg/dL -  Creatinine 1.47 - 8.29 mg/dL -  Sodium 562 - 130 mmol/L -  Potassium 3.5 - 5.1 mmol/L 4.2  Chloride 101 - 111 mmol/L -  CO2 22 - 32 mmol/L -  Calcium 8.9 - 10.3 mg/dL -  Total Protein 6.5 - 8.1 g/dL -  Total Bilirubin 0.3 - 1.2 mg/dL -  Alkaline Phos 38 - 865 U/L -  AST 15 - 41 U/L -  ALT 14 - 54 U/L -   CBC Latest Ref Rng & Units 08/27/2017  WBC 3.6 - 11.0 K/uL 8.2  Hemoglobin 12.0 - 16.0 g/dL 78.4  Hematocrit 69.6 - 47.0 % 38.1  Platelets 150 - 440 K/uL 191    No images are attached to the encounter.  Dg Chest 2 View  Result Date: 07/28/2017 CLINICAL DATA:  Chest pain for 1 week. EXAM: CHEST  2 VIEW COMPARISON:  Single-view of the chest 04/06/2017. PA and lateral chest 01/16/2017. FINDINGS: The lungs are clear. Heart size is upper normal. No pneumothorax or pleural fluid. No acute bony abnormality. The patient has chronic upper and mid to lower thoracic spine compression fractures, unchanged. IMPRESSION: No acute disease. Electronically Signed   By: Drusilla Kanner M.D.   On:  07/28/2017 13:13    Assessment and plan- Patient is  a 62 y.o. female with iron deficiency anemia due to GI bleed but source is unclear (obscure overt GI bleed)  Repeat cbc and iron studies today. If she is still iron deficient, we will plan for IV iron feraheme 510 mg Iv X 2 doses. Discussed risks and benefits of ferriheme including all but not limited to risk of infusion reactions, headache, leg swelling. Patient understands and agrees to proceed. I will see her back in 2 months time with repeat CBC and ferritin and iron studies after receiving 2 doses of IV iron. Also if she is continuing to be iron deficient she will need a capsule endoscopy as an outpatient by Dr. Servando Snare. I will also check urinalysis yet disease panel and stool H. pylori antigen today   Thank you for this kind referral and the opportunity to participate in the care of this patient   Visit Diagnosis 1. Iron deficiency anemia due to chronic blood loss     Dr. Owens Shark, MD, MPH [facility] [address] at [facility] Pager- [phone] 08/27/2017

## 2017-08-27 NOTE — Progress Notes (Signed)
Pt feeling the same as tired , not started iron pills, no blood in stool. She is interested in quitting smoking and tried patch. But it made her jittery.

## 2017-08-27 NOTE — Telephone Encounter (Signed)
Cancel Iron Infusion, per Sherry/verbal. Patient will be on Oral Iron Meds. Also rschd f/u visit with labs for 3 mths. Updated appt schd mailed to patient.  Patient is aware. 08/27/17 MF

## 2017-08-27 NOTE — Telephone Encounter (Signed)
Called pt and let her know that her hgb is normal and her ferritin is good number and Dr. Janese Banks states she does not need iron treatments based on labs but she should start ferrous sulfate 325 mg bid with food.  She has not bought any as of today's date but she plans on going and getting some today.  Dr. Janese Banks wants to move her appt. From 2 month to 3 months and she will get labs that same day.  Dr. Janese Banks.  Patient agreeable to moving appt out since iron studies and hgb was good.  I sent message to have scheduler mover her out 1 month to make a total of 3 months from now.

## 2017-08-28 ENCOUNTER — Telehealth: Payer: Self-pay | Admitting: *Deleted

## 2017-08-28 DIAGNOSIS — Z87891 Personal history of nicotine dependence: Secondary | ICD-10-CM

## 2017-08-28 DIAGNOSIS — Z122 Encounter for screening for malignant neoplasm of respiratory organs: Secondary | ICD-10-CM

## 2017-08-28 NOTE — Telephone Encounter (Signed)
Received referral for initial lung cancer screening scan. Contacted patient and obtained smoking history,(current, 45 pack year) as well as answering questions related to screening process. Patient denies signs of lung cancer such as weight loss or hemoptysis. Patient denies comorbidity that would prevent curative treatment if lung cancer were found. Patient is scheduled for shared decision making visit and CT scan on 09/09/17.

## 2017-08-29 LAB — CELIAC DISEASE PANEL
Endomysial Ab, IgA: NEGATIVE
IgA: 231 mg/dL (ref 87–352)
Tissue Transglutaminase Ab, IgA: <2 U/mL (ref 0–3)

## 2017-09-02 ENCOUNTER — Ambulatory Visit: Payer: Medicare Other

## 2017-09-09 ENCOUNTER — Inpatient Hospital Stay: Payer: Medicare Other | Attending: Nurse Practitioner | Admitting: Nurse Practitioner

## 2017-09-09 ENCOUNTER — Ambulatory Visit: Admission: RE | Admit: 2017-09-09 | Payer: Medicare Other | Source: Ambulatory Visit

## 2017-09-09 ENCOUNTER — Encounter: Payer: Self-pay | Admitting: Nurse Practitioner

## 2017-09-09 ENCOUNTER — Ambulatory Visit: Payer: Medicare Other

## 2017-09-11 ENCOUNTER — Telehealth: Payer: Self-pay | Admitting: *Deleted

## 2017-09-11 NOTE — Telephone Encounter (Signed)
Contacted patient related to missed lung screening appt. Patient verbalizes anxiety about "finding something wrong". Discussed screening at length including likelihood of not finding anything concerning for lung cancer. Patient is agreeable for rescheduling.

## 2017-09-17 ENCOUNTER — Other Ambulatory Visit: Payer: Medicare Other

## 2017-09-17 ENCOUNTER — Ambulatory Visit: Payer: Medicare Other | Admitting: Oncology

## 2017-09-22 ENCOUNTER — Encounter: Payer: Self-pay | Admitting: Emergency Medicine

## 2017-09-22 ENCOUNTER — Emergency Department
Admission: EM | Admit: 2017-09-22 | Discharge: 2017-09-22 | Disposition: A | Discharge status: Home / Self Care | Payer: Medicare Other | Attending: Emergency Medicine | Admitting: Emergency Medicine

## 2017-09-22 ENCOUNTER — Inpatient Hospital Stay: Payer: Medicare Other | Attending: Oncology | Admitting: Oncology

## 2017-09-22 ENCOUNTER — Ambulatory Visit: Payer: Medicare Other

## 2017-09-22 ENCOUNTER — Emergency Department: Payer: Medicare Other

## 2017-09-22 DIAGNOSIS — Z79899 Other long term (current) drug therapy: Secondary | ICD-10-CM | POA: Diagnosis not present

## 2017-09-22 DIAGNOSIS — I10 Essential (primary) hypertension: Secondary | ICD-10-CM | POA: Diagnosis not present

## 2017-09-22 DIAGNOSIS — J449 Chronic obstructive pulmonary disease, unspecified: Secondary | ICD-10-CM | POA: Diagnosis not present

## 2017-09-22 DIAGNOSIS — R079 Chest pain, unspecified: Secondary | ICD-10-CM | POA: Diagnosis present

## 2017-09-22 DIAGNOSIS — Z87891 Personal history of nicotine dependence: Secondary | ICD-10-CM

## 2017-09-22 DIAGNOSIS — I2510 Atherosclerotic heart disease of native coronary artery without angina pectoris: Secondary | ICD-10-CM | POA: Insufficient documentation

## 2017-09-22 DIAGNOSIS — F17210 Nicotine dependence, cigarettes, uncomplicated: Secondary | ICD-10-CM | POA: Insufficient documentation

## 2017-09-22 DIAGNOSIS — Z122 Encounter for screening for malignant neoplasm of respiratory organs: Secondary | ICD-10-CM | POA: Diagnosis not present

## 2017-09-22 LAB — BASIC METABOLIC PANEL
ANION GAP: 10 (ref 5–15)
BUN: 9 mg/dL (ref 6–20)
CALCIUM: 9.7 mg/dL (ref 8.9–10.3)
CO2: 25 mmol/L (ref 22–32)
CREATININE: 0.85 mg/dL (ref 0.44–1.00)
Chloride: 99 mmol/L — ABNORMAL LOW (ref 101–111)
GFR calc Af Amer: >60 mL/min (ref >60)
GFR calc non Af Amer: >60 mL/min (ref >60)
GLUCOSE: 66 mg/dL (ref 65–99)
Potassium: 4.2 mmol/L (ref 3.5–5.1)
Sodium: 134 mmol/L — ABNORMAL LOW (ref 135–145)

## 2017-09-22 LAB — CBC
HCT: 41 % (ref 35.0–47.0)
HEMOGLOBIN: 13.6 g/dL (ref 12.0–16.0)
MCH: 27.6 pg (ref 26.0–34.0)
MCHC: 33.2 g/dL (ref 32.0–36.0)
MCV: 83.1 fL (ref 80.0–100.0)
Platelets: 235 10*3/uL (ref 150–440)
RBC: 4.93 MIL/uL (ref 3.80–5.20)
RDW: 27 % — AB (ref 11.5–14.5)
WBC: 9 10*3/uL (ref 3.6–11.0)

## 2017-09-22 LAB — TROPONIN I: Troponin I: <0.03 ng/mL (ref <0.03)

## 2017-09-22 MED ORDER — ASPIRIN 81 MG PO CHEW
324.0000 mg | CHEWABLE_TABLET | Freq: Once | ORAL | Status: AC
  Administered 2017-09-22: 324 mg via ORAL
  Filled 2017-09-22: qty 4

## 2017-09-22 MED ORDER — OXYCODONE-ACETAMINOPHEN 5-325 MG PO TABS
1.0000 | ORAL_TABLET | Freq: Once | ORAL | Status: AC
  Administered 2017-09-22: 1 via ORAL
  Filled 2017-09-22: qty 1

## 2017-09-22 MED ORDER — TRAMADOL HCL 50 MG PO TABS
50.0000 mg | ORAL_TABLET | Freq: Once | ORAL | Status: AC
  Administered 2017-09-22: 50 mg via ORAL
  Filled 2017-09-22: qty 1

## 2017-09-22 NOTE — ED Notes (Signed)
Sandwich tray given at this time 

## 2017-09-22 NOTE — Discharge Instructions (Signed)
You have been seen in the emergency department today for chest pain. Your workup has shown normal results. As we discussed please follow-up with your primary care physician in the next 1-2 days for recheck. Return to the emergency department for any further chest pain, trouble breathing, or any other symptom personally concerning to yourself. °

## 2017-09-22 NOTE — ED Provider Notes (Signed)
[facility] Emergency Department Provider Note  Time seen: 3:17 PM  I have reviewed the triage vital signs and the nursing notes.   HISTORY  Chief Complaint Chest Pain    HPI Traci Duran is a 61 y.o. female With a past medical history of anxiety, anemia, CAD, gastric reflux, gastritis, hypertension, hyperlipidemia, presents to the emergency department for intermittent chest pain. According to the patient the past 2 weeks she's been experiencing intermittent central chest heaviness with occasional pain. Denies any currently. Also states she has intermittent left leg pain, but has seen a doctor about this in the past. Patient denies any trouble breathing, does state cough with occasional sputum production. Denies any fever. Denies abdominal pain nausea vomiting or diarrhea.  Past Medical History:  Diagnosis Date  . Anemia   . Anxiety   . COPD (chronic obstructive pulmonary disease) ([place])   . Coronary artery disease   . Depression   . Gastritis   . GERD (gastroesophageal reflux disease)   . Hemorrhoids   . Hyperlipidemia   . Hypertension   . PAD (peripheral artery disease) [facility])     Patient Active Problem List   Diagnosis Date Noted  . Chest pain 07/28/2017  . Acute blood loss anemia 07/28/2017  . Demand ischemia ([place])   . Benign neoplasm of descending colon   . Polyp of sigmoid colon   . Age related osteoporosis 04/16/2017  . Iron deficiency anemia secondary to blood loss (chronic)   . GI bleed 04/06/2017  . Diastolic dysfunction 38/09/1750  . Opioid contract exists 06/27/2016  . Incidental lung nodule 01/18/2016  . Traumatic hemorrhage of cerebrum ([place]) 12/04/2015  . Hypovitaminosis D 08/29/2015  . Thyroid lesion 08/29/2015  . Major depressive disorder, single episode, unspecified 08/27/2015  . Basal ganglia hemorrhage ([place]) 08/25/2015  . Gastroesophageal reflux disease 01/26/2015  . Osteopenia 01/26/2015  . Anemia, unspecified 12/27/2014   . History of fall 12/27/2014  . Greater tuberosity of humerus fracture 10/24/2013  . AVF (arteriovenous fistula) ([place]) 11/08/2011  . Anxiety 10/24/2011  . Cerebrovascular disease 10/24/2011  . Dyslipidemia 10/24/2011  . Hypertension 10/24/2011  . Tobacco abuse 10/24/2011  . Palpitations 10/24/2011  . Renal artery stenosis ([place]) 10/24/2011  . Unstable angina ([place]) 10/14/2011  . Hyperlipidemia   . Chronic obstructive pulmonary disease ([place])   . Depression   . Peripheral arterial disease ([place])   . Coronary artery disease, non-occlusive     Past Surgical History:  Procedure Laterality Date  . APPENDECTOMY    . COLONOSCOPY WITH PROPOFOL N/A 06/09/2017   Procedure: COLONOSCOPY WITH PROPOFOL;  Surgeon: Lucilla Lame, MD;  Location: [facility] ENDOSCOPY;  Service: Endoscopy;  Laterality: N/A;  . ESOPHAGOGASTRODUODENOSCOPY N/A 07/30/2017   Procedure: ESOPHAGOGASTRODUODENOSCOPY (EGD);  Surgeon: Lin Landsman, MD;  Location: [facility] ENDOSCOPY;  Service: Gastroenterology;  Laterality: N/A;  . ESOPHAGOGASTRODUODENOSCOPY (EGD) WITH PROPOFOL N/A 04/09/2017   Procedure: ESOPHAGOGASTRODUODENOSCOPY (EGD) WITH PROPOFOL;  Surgeon: Lucilla Lame, MD;  Location: [facility] ENDOSCOPY;  Service: Endoscopy;  Laterality: N/A;  . HIP FRACTURE SURGERY      Prior to Admission medications   Medication Sig Start Date End Date Taking? Authorizing Provider  ALPRAZolam Duanne Moron) 1 MG tablet Take 1 mg by mouth 3 (three) times daily as needed. For anxiety     [provider]  atorvastatin (LIPITOR) 80 MG tablet Take 80 mg by mouth daily.    [provider]  busPIRone (BUSPAR) 10 MG tablet Take 10 mg by mouth daily.  04/30/17  [provider]  carvedilol (COREG) 6.25 MG tablet Take 6.25 mg by mouth 2 (two) times daily. 05/28/17   [provider]  citalopram (CELEXA) 40 MG tablet Take 40 mg by mouth daily.    [provider]  clopidogrel (PLAVIX) 75 MG tablet Take 75 mg by mouth daily.     [provider]  DULoxetine (CYMBALTA) 30 MG capsule Take 30 mg by mouth daily. 05/28/17   [provider]  ferrous sulfate 325 (65 FE) MG tablet Take 1 tablet (325 mg total) by mouth 3 (three) times daily with meals. Patient not taking: Reported on 08/27/2017 07/30/17   Fritzi Mandes, MD  gabapentin (NEURONTIN) 300 MG capsule Take 1 capsule by mouth 4 (four) times daily.  04/06/17   [provider]  lisinopril (PRINIVIL,ZESTRIL) 20 MG tablet Take 20 mg by mouth daily.      [provider]  Multiple Vitamin (MULTIVITAMIN WITH MINERALS) TABS tablet Take 2 tablets by mouth daily.    [provider]  nitroGLYCERIN (NITROSTAT) 0.4 MG SL tablet Place under the tongue. 01/24/16   [provider]  pantoprazole (PROTONIX) 40 MG tablet Take 1 tablet (40 mg total) by mouth 2 (two) times daily before a meal. 07/30/17   Fritzi Mandes, MD  potassium chloride (K-DUR) 10 MEQ tablet Take 1 tablet (10 mEq total) by mouth daily. 05/28/17   Earleen Newport, MD  traMADol (ULTRAM) 50 MG tablet Take 1 tablet (50 mg total) by mouth every 6 (six) hours as needed. 07/30/17   Fritzi Mandes, MD  Vitamin D, Ergocalciferol, (DRISDOL) 50000 units CAPS capsule TAKE 1 CAPSULE  50,000 UNITS TOTAL  BY MOUTH ONCE A WEEK 05/01/17   [provider]    Allergies  Allergen Reactions  . Chantix [Varenicline] Hives, Shortness Of Breath and Palpitations  . Diphenhydramine Hcl Shortness Of Breath  . Pregabalin Other (See Comments)    Falls  . Benadryl [Diphenhydramine Hcl] Rash  . Niacin Rash  . Trazodone Palpitations    Family History  Problem Relation Age of Onset  . Heart failure Mother   . Heart attack Mother        mother died at 80 of an MI  . Heart attack Father        father died at 67 of an MI  . Heart attack Brother        Brother had an MI in his 4s    Social History Social History  Substance Use Topics  . Smoking status: Current Some Day Smoker     Packs/day: 1.00    Years: 45.00    Types: Cigarettes  . Smokeless tobacco: Never Used     Comment: she feels that the 21 nicotine made her jittery and we suggested a lower level patch  . Alcohol use No    Review of Systems Constitutional: Negative for fever. Cardiovascular: intermittent chest heaviness 2 weeks. Respiratory: Negative for shortness of breath.positive for cough with occasional sputum. Gastrointestinal: Negative for abdominal pain, vomiting and diarrhea. Musculoskeletal: intermittent left leg pain which the patient states she has seen a physician for in the past, no leg swelling. Neurological: Negative for headache All other ROS negative  ____________________________________________   PHYSICAL EXAM:  VITAL SIGNS: ED Triage Vitals [09/22/17 1434]  Enc Vitals Group     BP 139/81     Pulse Rate 63     Resp 20     Temp 98.2 F (36.8 C)     Temp  Source Oral     SpO2 95 %     Weight 142 lb (64.4 kg)     Height      Head Circumference      Peak Flow      Pain Score 7     Pain Loc      Pain Edu?      Excl. in [place]?     Constitutional: Alert and oriented. Well appearing and in no distress. Eyes: Normal exam ENT   Head: Normocephalic and atraumatic.   Mouth/Throat: Mucous membranes are moist. Cardiovascular: Normal rate, regular rhythm. No murmur Respiratory: Normal respiratory effort without tachypnea nor retractions. Breath sounds are clear Gastrointestinal: Soft and nontender. No distention.   Musculoskeletal: Nontender with normal range of motion in all extremities. No lower extremity tenderness or edema.warm extremities, normal appearance. Neurologic:  Normal speech and language. No gross focal neurologic deficits Psychiatric: Mood and affect are normal  ____________________________________________    EKG  EKG reviewed and interpreted by myself shows normal sinus rhythm at 66 bpm, narrow QRS, normal axis, normal pulse, no concerning ST changes.  Overall normal EKG.  ____________________________________________    RADIOLOGY  chest x-ray normal  ____________________________________________   INITIAL IMPRESSION / ASSESSMENT AND PLAN / ED COURSE  Pertinent labs & imaging results that were available during my care of the patient were reviewed by me and considered in my medical decision making (see chart for details).  patient presents to the emergency department for intermittent chest heaviness over the past 2 weeks. Denies abdominal pain. Denies nausea, denies diaphoresis. Patient minutes a strong history of anxiety. Differential this time would include ACS, nonspecific chest pain, musculoskeletal pain, anxiety, pneumonia, pneumothorax, URI. We will check labs and a chest x-ray. Patient's EKG is reassuring. Overall the patient appears well, states mild pain in her left leg at times.  patient's x-ray is normal, labs are normal including negative troponin.  I have reviewed the patient's records including her recent discharge August/20/18 after an NSTEMI. his visit it was documented that the patient has chronic claudication pain in her legs. Her troponin was elevated at that time and her hemoglobin was found to be very low 5.8 due to a GI bleed.elevated troponin likely due to demand ischemia at that time.  today the patient's hemoglobin is normal. Troponin is negative. We will repeat a troponin. If the patient's repeat troponin remains normal I believe the patient would be safe for discharge home with cardiology follow-up. I discussed this plan care with the patient, and she is also in agreement.  patient's repeat troponin remains negative. Patient is feeling very well. Patient did complain of dark stool, rectal exam shows somewhat dark stool but guaiac negative. Patient's hemoglobin is normal. I believe the patient is safe for discharge home with primary care follow-up within the next 1-2 days, patient is agreeable to this plan of  care.  ____________________________________________   FINAL CLINICAL IMPRESSION(S) / ED DIAGNOSES  chest pain    Harvest Dark, MD 09/22/17 [phone]

## 2017-09-22 NOTE — ED Triage Notes (Signed)
Pt reports chest pressure on and off for two weeks.

## 2017-09-22 NOTE — ED Notes (Addendum)
This EDT responded to call light to find pt removing all wires, and lines from her person. Pt was attempting to take her own IV out. This EDT took her IV out for safety concerns as pt was getting up to walk out and leave. IV has been removed and RN has been notified

## 2017-09-22 NOTE — Progress Notes (Signed)
In accordance with CMS guidelines, patient has met eligibility criteria including age, absence of signs or symptoms of lung cancer.  Social History  Substance Use Topics  . Smoking status: Current Some Day Smoker    Packs/day: 1.00    Years: 45.00    Types: Cigarettes  . Smokeless tobacco: Never Used     Comment: she feels that the 21 nicotine made her jittery and we suggested a lower level patch  . Alcohol use No     A shared decision-making session was conducted prior to the performance of CT scan. This includes one or more decision aids, includes benefits and harms of screening, follow-up diagnostic testing, over-diagnosis, false positive rate, and total radiation exposure.  Counseling on the importance of adherence to annual lung cancer LDCT screening, impact of co-morbidities, and ability or willingness to undergo diagnosis and treatment is imperative for compliance of the program.  Counseling on the importance of continued smoking cessation for former smokers; the importance of smoking cessation for current smokers, and information about tobacco cessation interventions have been given to patient including [place] and [facility] quit [place] programs.  Written order for lung cancer screening with LDCT has been given to the patient and any and all questions have been answered to the best of my abilities.   Yearly follow up will be coordinated by Burgess Estelle, Thoracic Navigator.  Faythe Casa, NP 09/22/2017 1:45 PM

## 2017-09-22 NOTE — ED Notes (Addendum)
Unable to esign. Sig pad not working.

## 2017-09-30 ENCOUNTER — Ambulatory Visit
Admission: RE | Admit: 2017-09-30 | Discharge: 2017-09-30 | Disposition: A | Discharge status: Home / Self Care | Payer: Medicare Other | Source: Ambulatory Visit | Attending: Nurse Practitioner | Admitting: Nurse Practitioner

## 2017-09-30 DIAGNOSIS — K449 Diaphragmatic hernia without obstruction or gangrene: Secondary | ICD-10-CM | POA: Diagnosis not present

## 2017-09-30 DIAGNOSIS — Z87891 Personal history of nicotine dependence: Secondary | ICD-10-CM | POA: Diagnosis present

## 2017-09-30 DIAGNOSIS — J439 Emphysema, unspecified: Secondary | ICD-10-CM | POA: Insufficient documentation

## 2017-09-30 DIAGNOSIS — I700 Atherosclerosis of aorta: Secondary | ICD-10-CM | POA: Insufficient documentation

## 2017-09-30 DIAGNOSIS — I2510 Atherosclerotic heart disease of native coronary artery without angina pectoris: Secondary | ICD-10-CM | POA: Diagnosis not present

## 2017-09-30 DIAGNOSIS — Z122 Encounter for screening for malignant neoplasm of respiratory organs: Secondary | ICD-10-CM | POA: Insufficient documentation

## 2017-10-01 ENCOUNTER — Telehealth: Payer: Self-pay | Admitting: *Deleted

## 2017-10-01 NOTE — Telephone Encounter (Signed)
Notified patient of LDCT lung cancer screening program results with recommendation for 12 month follow up imaging. Also notified of incidental findings noted below and is encouraged to discuss further with PCP. Patient verbalizes understanding.   IMPRESSION: 1. Lung-RADS 2-S, benign appearance or behavior. Continue annual screening with low-dose chest CT without contrast in 12 months. 2. The "S" modifier above refers to potentially clinically significant non lung cancer related findings. Specifically, three-vessel coronary atherosclerosis. 3. Small hiatal hernia.  Aortic Atherosclerosis (ICD10-I70.0) and Emphysema (ICD10-J43.9).

## 2017-10-10 ENCOUNTER — Inpatient Hospital Stay
Admission: EM | Admit: 2017-10-10 | Discharge: 2017-10-12 | DRG: 641 | Disposition: A | Discharge status: Home / Self Care | Payer: Medicare Other | Attending: Internal Medicine | Admitting: Internal Medicine

## 2017-10-10 ENCOUNTER — Emergency Department: Payer: Medicare Other

## 2017-10-10 DIAGNOSIS — J449 Chronic obstructive pulmonary disease, unspecified: Secondary | ICD-10-CM | POA: Diagnosis present

## 2017-10-10 DIAGNOSIS — Z888 Allergy status to other drugs, medicaments and biological substances status: Secondary | ICD-10-CM

## 2017-10-10 DIAGNOSIS — I739 Peripheral vascular disease, unspecified: Secondary | ICD-10-CM | POA: Diagnosis not present

## 2017-10-10 DIAGNOSIS — E861 Hypovolemia: Secondary | ICD-10-CM | POA: Diagnosis present

## 2017-10-10 DIAGNOSIS — F329 Major depressive disorder, single episode, unspecified: Secondary | ICD-10-CM | POA: Diagnosis present

## 2017-10-10 DIAGNOSIS — I2510 Atherosclerotic heart disease of native coronary artery without angina pectoris: Secondary | ICD-10-CM | POA: Diagnosis present

## 2017-10-10 DIAGNOSIS — K219 Gastro-esophageal reflux disease without esophagitis: Secondary | ICD-10-CM | POA: Diagnosis not present

## 2017-10-10 DIAGNOSIS — Z7982 Long term (current) use of aspirin: Secondary | ICD-10-CM | POA: Diagnosis not present

## 2017-10-10 DIAGNOSIS — Z8249 Family history of ischemic heart disease and other diseases of the circulatory system: Secondary | ICD-10-CM | POA: Diagnosis not present

## 2017-10-10 DIAGNOSIS — E871 Hypo-osmolality and hyponatremia: Secondary | ICD-10-CM | POA: Diagnosis present

## 2017-10-10 DIAGNOSIS — S51019A Laceration without foreign body of unspecified elbow, initial encounter: Secondary | ICD-10-CM

## 2017-10-10 DIAGNOSIS — F17210 Nicotine dependence, cigarettes, uncomplicated: Secondary | ICD-10-CM | POA: Diagnosis not present

## 2017-10-10 DIAGNOSIS — Z79899 Other long term (current) drug therapy: Secondary | ICD-10-CM

## 2017-10-10 DIAGNOSIS — Z8719 Personal history of other diseases of the digestive system: Secondary | ICD-10-CM | POA: Diagnosis not present

## 2017-10-10 DIAGNOSIS — I10 Essential (primary) hypertension: Secondary | ICD-10-CM | POA: Diagnosis present

## 2017-10-10 DIAGNOSIS — Z7902 Long term (current) use of antithrombotics/antiplatelets: Secondary | ICD-10-CM | POA: Diagnosis not present

## 2017-10-10 DIAGNOSIS — R531 Weakness: Secondary | ICD-10-CM

## 2017-10-10 DIAGNOSIS — N179 Acute kidney failure, unspecified: Secondary | ICD-10-CM | POA: Diagnosis not present

## 2017-10-10 DIAGNOSIS — E785 Hyperlipidemia, unspecified: Secondary | ICD-10-CM | POA: Diagnosis not present

## 2017-10-10 DIAGNOSIS — G8929 Other chronic pain: Secondary | ICD-10-CM | POA: Diagnosis present

## 2017-10-10 DIAGNOSIS — W19XXXA Unspecified fall, initial encounter: Secondary | ICD-10-CM | POA: Diagnosis present

## 2017-10-10 NOTE — ED Triage Notes (Signed)
Pt brought in by [facility] from home.  States pt she fell because her "legs went numb", but pt states she has hx of neuropathy.  Pt has knot to L knee and knot to L elbow.  Pt also bringing up various chronic disease issues and repeatedly mentioning that her PCP stated that she had a mild heart attack earlier this month that pt had to be transfused for.  Pt states that this was done at this hospital.  Unable to get a clear cut history and reason for fall tonight from patient.  Pt has complaints about chronic issues repeatedly during triage assessment.  Pt is A&Ox4 and in NAD.

## 2017-10-11 ENCOUNTER — Other Ambulatory Visit: Payer: Self-pay

## 2017-10-11 DIAGNOSIS — Z8249 Family history of ischemic heart disease and other diseases of the circulatory system: Secondary | ICD-10-CM | POA: Diagnosis not present

## 2017-10-11 DIAGNOSIS — Z7982 Long term (current) use of aspirin: Secondary | ICD-10-CM | POA: Diagnosis not present

## 2017-10-11 DIAGNOSIS — I739 Peripheral vascular disease, unspecified: Secondary | ICD-10-CM | POA: Diagnosis not present

## 2017-10-11 DIAGNOSIS — N179 Acute kidney failure, unspecified: Secondary | ICD-10-CM | POA: Diagnosis not present

## 2017-10-11 DIAGNOSIS — J449 Chronic obstructive pulmonary disease, unspecified: Secondary | ICD-10-CM | POA: Diagnosis not present

## 2017-10-11 DIAGNOSIS — Z7902 Long term (current) use of antithrombotics/antiplatelets: Secondary | ICD-10-CM | POA: Diagnosis not present

## 2017-10-11 DIAGNOSIS — S51019A Laceration without foreign body of unspecified elbow, initial encounter: Secondary | ICD-10-CM | POA: Diagnosis not present

## 2017-10-11 DIAGNOSIS — I10 Essential (primary) hypertension: Secondary | ICD-10-CM | POA: Diagnosis not present

## 2017-10-11 DIAGNOSIS — G8929 Other chronic pain: Secondary | ICD-10-CM | POA: Diagnosis not present

## 2017-10-11 DIAGNOSIS — F17210 Nicotine dependence, cigarettes, uncomplicated: Secondary | ICD-10-CM | POA: Diagnosis not present

## 2017-10-11 DIAGNOSIS — W19XXXA Unspecified fall, initial encounter: Secondary | ICD-10-CM | POA: Diagnosis not present

## 2017-10-11 DIAGNOSIS — K219 Gastro-esophageal reflux disease without esophagitis: Secondary | ICD-10-CM | POA: Diagnosis not present

## 2017-10-11 DIAGNOSIS — E871 Hypo-osmolality and hyponatremia: Secondary | ICD-10-CM | POA: Diagnosis present

## 2017-10-11 DIAGNOSIS — Z79899 Other long term (current) drug therapy: Secondary | ICD-10-CM | POA: Diagnosis not present

## 2017-10-11 DIAGNOSIS — E785 Hyperlipidemia, unspecified: Secondary | ICD-10-CM | POA: Diagnosis not present

## 2017-10-11 DIAGNOSIS — F329 Major depressive disorder, single episode, unspecified: Secondary | ICD-10-CM | POA: Diagnosis not present

## 2017-10-11 DIAGNOSIS — E861 Hypovolemia: Secondary | ICD-10-CM | POA: Diagnosis not present

## 2017-10-11 DIAGNOSIS — Z8719 Personal history of other diseases of the digestive system: Secondary | ICD-10-CM | POA: Diagnosis not present

## 2017-10-11 DIAGNOSIS — I2510 Atherosclerotic heart disease of native coronary artery without angina pectoris: Secondary | ICD-10-CM | POA: Diagnosis not present

## 2017-10-11 LAB — CBC
HEMATOCRIT: 33.4 % — AB (ref 35.0–47.0)
HEMOGLOBIN: 11.3 g/dL — AB (ref 12.0–16.0)
MCH: 28.7 pg (ref 26.0–34.0)
MCHC: 33.7 g/dL (ref 32.0–36.0)
MCV: 85 fL (ref 80.0–100.0)
Platelets: 236 10*3/uL (ref 150–440)
RBC: 3.93 MIL/uL (ref 3.80–5.20)
RDW: 23.6 % — ABNORMAL HIGH (ref 11.5–14.5)
WBC: 11.5 10*3/uL — AB (ref 3.6–11.0)

## 2017-10-11 LAB — BASIC METABOLIC PANEL
ANION GAP: 11 (ref 5–15)
BUN: 14 mg/dL (ref 6–20)
CO2: 23 mmol/L (ref 22–32)
Calcium: 8.9 mg/dL (ref 8.9–10.3)
Chloride: 91 mmol/L — ABNORMAL LOW (ref 101–111)
Creatinine, Ser: 1.09 mg/dL — ABNORMAL HIGH (ref 0.44–1.00)
GFR calc Af Amer: >60 mL/min (ref >60)
GFR calc non Af Amer: 54 mL/min — ABNORMAL LOW (ref >60)
GLUCOSE: 78 mg/dL (ref 65–99)
POTASSIUM: 4.3 mmol/L (ref 3.5–5.1)
SODIUM: 125 mmol/L — AB (ref 135–145)

## 2017-10-11 LAB — ETHANOL

## 2017-10-11 LAB — TROPONIN I: Troponin I: <0.03 ng/mL (ref <0.03)

## 2017-10-11 LAB — TSH: TSH: 0.748 u[IU]/mL (ref 0.350–4.500)

## 2017-10-11 MED ORDER — CLOPIDOGREL BISULFATE 75 MG PO TABS
75.0000 mg | ORAL_TABLET | Freq: Every day | ORAL | Status: DC
  Administered 2017-10-11: 75 mg via ORAL
  Filled 2017-10-11: qty 1

## 2017-10-11 MED ORDER — ONDANSETRON HCL 4 MG PO TABS: 4.0000 mg | ORAL_TABLET | Freq: Four times a day (QID) | ORAL | Status: DC | PRN

## 2017-10-11 MED ORDER — FERROUS SULFATE 325 (65 FE) MG PO TABS
325.0000 mg | ORAL_TABLET | Freq: Three times a day (TID) | ORAL | Status: DC
  Administered 2017-10-11 – 2017-10-12 (×4): 325 mg via ORAL
  Filled 2017-10-11 (×4): qty 1

## 2017-10-11 MED ORDER — GABAPENTIN 300 MG PO CAPS
300.0000 mg | ORAL_CAPSULE | Freq: Three times a day (TID) | ORAL | Status: DC
  Administered 2017-10-11 (×3): 300 mg via ORAL
  Filled 2017-10-11 (×3): qty 1

## 2017-10-11 MED ORDER — ATORVASTATIN CALCIUM 20 MG PO TABS
80.0000 mg | ORAL_TABLET | Freq: Every day | ORAL | Status: DC
  Administered 2017-10-11: 80 mg via ORAL
  Filled 2017-10-11: qty 4

## 2017-10-11 MED ORDER — PANTOPRAZOLE SODIUM 20 MG PO TBEC: 20.0000 mg | DELAYED_RELEASE_TABLET | Freq: Every day | ORAL | Status: DC

## 2017-10-11 MED ORDER — DULOXETINE HCL 30 MG PO CPEP
30.0000 mg | ORAL_CAPSULE | Freq: Every day | ORAL | Status: DC
  Administered 2017-10-11: 11:00:00 30 mg via ORAL
  Filled 2017-10-11: qty 1

## 2017-10-11 MED ORDER — CITALOPRAM HYDROBROMIDE 20 MG PO TABS
40.0000 mg | ORAL_TABLET | Freq: Every day | ORAL | Status: DC
  Administered 2017-10-11: 11:00:00 40 mg via ORAL
  Filled 2017-10-11: qty 2

## 2017-10-11 MED ORDER — SODIUM CHLORIDE 0.9 % IV BOLUS (SEPSIS)
1000.0000 mL | Freq: Once | INTRAVENOUS | Status: AC
  Administered 2017-10-11: 1000 mL via INTRAVENOUS

## 2017-10-11 MED ORDER — MORPHINE SULFATE (PF) 2 MG/ML IV SOLN
2.0000 mg | INTRAVENOUS | Status: DC | PRN
  Administered 2017-10-11 – 2017-10-12 (×3): 2 mg via INTRAVENOUS
  Filled 2017-10-11 (×3): qty 1

## 2017-10-11 MED ORDER — ADULT MULTIVITAMIN W/MINERALS CH
2.0000 | ORAL_TABLET | Freq: Every day | ORAL | Status: DC
  Administered 2017-10-11: 11:00:00 2 via ORAL
  Filled 2017-10-11: qty 2

## 2017-10-11 MED ORDER — NITROGLYCERIN 0.4 MG SL SUBL: 0.4000 mg | SUBLINGUAL_TABLET | SUBLINGUAL | Status: DC | PRN

## 2017-10-11 MED ORDER — ACETAMINOPHEN 325 MG PO TABS: 650.0000 mg | ORAL_TABLET | Freq: Four times a day (QID) | ORAL | Status: DC | PRN

## 2017-10-11 MED ORDER — OXYCODONE-ACETAMINOPHEN 5-325 MG PO TABS
1.0000 | ORAL_TABLET | Freq: Once | ORAL | Status: AC
  Administered 2017-10-11: 1 via ORAL
  Filled 2017-10-11: qty 1

## 2017-10-11 MED ORDER — TRAMADOL HCL 50 MG PO TABS
50.0000 mg | ORAL_TABLET | Freq: Four times a day (QID) | ORAL | Status: DC | PRN
  Administered 2017-10-11: 50 mg via ORAL
  Filled 2017-10-11: qty 1

## 2017-10-11 MED ORDER — HEPARIN SODIUM (PORCINE) 5000 UNIT/ML IJ SOLN
5000.0000 [IU] | Freq: Three times a day (TID) | INTRAMUSCULAR | Status: DC
  Administered 2017-10-11: 5000 [IU] via SUBCUTANEOUS
  Filled 2017-10-11 (×3): qty 1

## 2017-10-11 MED ORDER — LISINOPRIL 20 MG PO TABS
20.0000 mg | ORAL_TABLET | Freq: Every day | ORAL | Status: DC
  Administered 2017-10-11: 11:00:00 20 mg via ORAL
  Filled 2017-10-11: qty 1

## 2017-10-11 MED ORDER — LORAZEPAM 2 MG/ML IJ SOLN
0.5000 mg | Freq: Once | INTRAMUSCULAR | Status: AC
  Administered 2017-10-11: 0.5 mg via INTRAVENOUS
  Filled 2017-10-11: qty 1

## 2017-10-11 MED ORDER — SODIUM CHLORIDE 0.9 % IV SOLN
INTRAVENOUS | Status: DC
  Administered 2017-10-11 (×2): via INTRAVENOUS

## 2017-10-11 MED ORDER — ALPRAZOLAM 1 MG PO TABS
1.0000 mg | ORAL_TABLET | Freq: Three times a day (TID) | ORAL | Status: DC | PRN
Start: 2017-10-11 — End: 2017-10-12
  Administered 2017-10-11: 11:00:00 1 mg via ORAL
  Filled 2017-10-11: qty 1

## 2017-10-11 MED ORDER — ALUM & MAG HYDROXIDE-SIMETH 200-200-20 MG/5ML PO SUSP: 30.0000 mL | Freq: Four times a day (QID) | ORAL | Status: DC | PRN

## 2017-10-11 MED ORDER — BUSPIRONE HCL 10 MG PO TABS
10.0000 mg | ORAL_TABLET | Freq: Every day | ORAL | Status: DC
  Administered 2017-10-11: 11:00:00 10 mg via ORAL
  Filled 2017-10-11 (×2): qty 1

## 2017-10-11 MED ORDER — CARVEDILOL 3.125 MG PO TABS
6.2500 mg | ORAL_TABLET | Freq: Two times a day (BID) | ORAL | Status: DC
  Administered 2017-10-11 (×2): 6.25 mg via ORAL
  Filled 2017-10-11 (×2): qty 2

## 2017-10-11 MED ORDER — ACETAMINOPHEN 650 MG RE SUPP: 650.0000 mg | Freq: Four times a day (QID) | RECTAL | Status: DC | PRN

## 2017-10-11 MED ORDER — PANTOPRAZOLE SODIUM 40 MG PO TBEC
40.0000 mg | DELAYED_RELEASE_TABLET | Freq: Every day | ORAL | Status: DC
  Administered 2017-10-11: 40 mg via ORAL
  Filled 2017-10-11: qty 1

## 2017-10-11 MED ORDER — POTASSIUM CHLORIDE CRYS ER 10 MEQ PO TBCR
10.0000 meq | EXTENDED_RELEASE_TABLET | Freq: Every day | ORAL | Status: DC
  Administered 2017-10-11: 10 meq via ORAL
  Filled 2017-10-11: qty 1

## 2017-10-11 MED ORDER — ONDANSETRON HCL 4 MG/2ML IJ SOLN: 4.0000 mg | Freq: Four times a day (QID) | INTRAMUSCULAR | Status: DC | PRN

## 2017-10-11 MED ORDER — DOCUSATE SODIUM 100 MG PO CAPS
100.0000 mg | ORAL_CAPSULE | Freq: Two times a day (BID) | ORAL | Status: DC
  Administered 2017-10-11 (×2): 100 mg via ORAL
  Filled 2017-10-11 (×2): qty 1

## 2017-10-11 NOTE — ED Notes (Signed)
See paper chart for downtime charting and medication administration.  

## 2017-10-11 NOTE — ED Notes (Signed)
Pt stating that she is having pain in her feet that is chronic.  Pt states that she normally takes gabapentin for this, but that it is not helping at this time.  Pt states that what really helps for her pain is percocet, but she doesn't want to have a prescription, because she doesn't want to be treated like a drug addict.  Pt up to bathroom with standby assist, pt has steady gait at this time.

## 2017-10-11 NOTE — ED Notes (Signed)
Pt was assisted to the restroom by this EDT

## 2017-10-11 NOTE — H&P (Signed)
Traci Duran is an 61 y.o. female.   Chief Complaint: Fall HPI: The patient with past medical history of hypertension, COPD, hyperlipidemia, and coronary artery disease resents to the emergency department after a fall.  The patient states that she does not remember the circumstances of her fall but complains of knee pain as well as elbow pain and chest pain.  Her chest pain is not associated with shortness of breath.  She does not have chest pain at this time.  She denies associated nausea or diaphoresis.  She was confused upon arrival and difficult to direct, thus she has been unhelpful as a historian.  Laboratory evaluation revealed significant hyponatremia.  X-rays of her knee show no fracture.  Thus the emergency department staff called the hospitalist service for further management.  Past Medical History:  Diagnosis Date  . Anemia   . Anxiety   . COPD (chronic obstructive pulmonary disease) ([place])   . Coronary artery disease   . Depression   . Gastritis   . GERD (gastroesophageal reflux disease)   . Hemorrhoids   . Hyperlipidemia   . Hypertension   . PAD (peripheral artery disease) ([place])     Past Surgical History:  Procedure Laterality Date  . APPENDECTOMY    . HIP FRACTURE SURGERY      Family History  Problem Relation Age of Onset  . Heart failure Mother   . Heart attack Mother        mother died at 17 of an MI  . Heart attack Father        father died at 39 of an MI  . Heart attack Brother        Brother had an MI in his 74s   Social History:  reports that she has been smoking cigarettes.  She has a 45.00 pack-year smoking history. she has never used smokeless tobacco. She reports that she uses drugs. Drug: Marijuana. She reports that she does not drink alcohol.  Allergies:  Allergies  Allergen Reactions  . Chantix [Varenicline] Hives, Shortness Of Breath and Palpitations  . Diphenhydramine Hcl Shortness Of Breath  . Pregabalin Other (See Comments)    Falls  .  Benadryl [Diphenhydramine Hcl] Rash  . Niacin Rash  . Trazodone Palpitations     (Not in a hospital admission)  Results for orders placed or performed during the hospital encounter of 10/10/17 (from the past 48 hour(s))  Basic metabolic panel     Status: Abnormal   Collection Time: 10/11/17 12:04 AM  Result Value Ref Range   Sodium 125 (L) 135 - 145 mmol/L   Potassium 4.3 3.5 - 5.1 mmol/L   Chloride 91 (L) 101 - 111 mmol/L   CO2 23 22 - 32 mmol/L   Glucose, Bld 78 65 - 99 mg/dL   BUN 14 6 - 20 mg/dL   Creatinine, Ser 1.09 (H) 0.44 - 1.00 mg/dL   Calcium 8.9 8.9 - 10.3 mg/dL   GFR calc non Af Amer 54 (L) >60 mL/min   GFR calc Af Amer >60 >60 mL/min    Comment: (NOTE) The eGFR has been calculated using the CKD EPI equation. This calculation has not been validated in all clinical situations. eGFR's persistently <60 mL/min signify possible Chronic Kidney Disease.    Anion gap 11 5 - 15  CBC     Status: Abnormal   Collection Time: 10/11/17 12:04 AM  Result Value Ref Range   WBC 11.5 (H) 3.6 - 11.0 K/uL  RBC 3.93 3.80 - 5.20 MIL/uL   Hemoglobin 11.3 (L) 12.0 - 16.0 g/dL   HCT 33.4 (L) 35.0 - 47.0 %   MCV 85.0 80.0 - 100.0 fL   MCH 28.7 26.0 - 34.0 pg   MCHC 33.7 32.0 - 36.0 g/dL   RDW 23.6 (H) 11.5 - 14.5 %   Platelets 236 150 - 440 K/uL  Troponin I     Status: None   Collection Time: 10/11/17 12:04 AM  Result Value Ref Range   Troponin I <0.03 <0.03 ng/mL  Ethanol     Status: None   Collection Time: 10/11/17 12:41 AM  Result Value Ref Range   Alcohol, Ethyl (B) <10 <10 mg/dL    Comment:        LOWEST DETECTABLE LIMIT FOR SERUM ALCOHOL IS 10 mg/dL FOR MEDICAL PURPOSES ONLY    Dg Chest 2 View  Result Date: 10/11/2017 CLINICAL DATA:  61 year old female with chest pain. EXAM: CHEST  2 VIEW COMPARISON:  Chest CT dated 09/30/2017 FINDINGS: The lungs are clear. There is no pleural effusion or pneumothorax. The cardiac silhouette is within normal limits. No acute osseous  pathology. Atherosclerotic calcification of the abdominal aorta noted. IMPRESSION: No active cardiopulmonary disease. Electronically Signed   By: Anner Crete M.D.   On: 10/11/2017 00:02   Dg Knee Complete 4 Views Left  Result Date: 10/11/2017 CLINICAL DATA:  Leg numbness, fell. EXAM: LEFT KNEE - COMPLETE 4+ VIEW COMPARISON:  LEFT knee radiograph September 04, 2014 FINDINGS: No evidence of fracture, dislocation, or joint effusion. Osteopenia. Femur intramedullary rod, trace loosening about proximal retaining screw. No evidence of arthropathy or other focal bone abnormality. Soft tissues are nonacute, mild vascular calcifications. IMPRESSION: No acute osseous process or advanced degenerative change. Osteopenia. Electronically Signed   By: Elon Alas M.D.   On: 10/11/2017 00:04    Review of Systems  Constitutional: Negative for chills and fever.  HENT: Negative for sore throat and tinnitus.   Eyes: Negative for blurred vision and redness.  Respiratory: Negative for cough and shortness of breath.   Cardiovascular: Negative for chest pain, palpitations, orthopnea and PND.  Gastrointestinal: Negative for abdominal pain, diarrhea, nausea and vomiting.  Genitourinary: Negative for dysuria, frequency and urgency.  Musculoskeletal: Positive for falls. Negative for joint pain and myalgias.  Skin: Negative for rash.       No lesions  Neurological: Positive for weakness. Negative for speech change and focal weakness.  Endo/Heme/Allergies: Does not bruise/bleed easily.       No temperature intolerance  Psychiatric/Behavioral: Negative for depression and suicidal ideas.    Blood pressure 91/69, pulse 71, temperature 97.7 F (36.5 C), resp. rate 11, height _0  (1.702 m), weight 61.2 kg (135 lb), SpO2 96 %. Physical Exam  Vitals reviewed. Constitutional: She is oriented to person, place, and time. She appears well-developed and well-nourished. No distress.  HENT:  Head: Normocephalic and  atraumatic.  Mouth/Throat: Oropharynx is clear and moist.  Eyes: Conjunctivae and EOM are normal. Pupils are equal, round, and reactive to light. No scleral icterus.  Neck: Normal range of motion. Neck supple. No JVD present. No tracheal deviation present. No thyromegaly present.  Cardiovascular: Normal rate, regular rhythm and normal heart sounds. Exam reveals no gallop and no friction rub.  No murmur heard. Respiratory: Effort normal and breath sounds normal.  GI: Soft. Bowel sounds are normal. She exhibits no distension. There is no tenderness.  Genitourinary:  Genitourinary Comments: Deferred  Musculoskeletal: Normal range of  motion. She exhibits no edema.  Lymphadenopathy:    She has no cervical adenopathy.  Neurological: She is alert and oriented to person, place, and time. No cranial nerve deficit. She exhibits normal muscle tone.  Skin: Skin is warm and dry. No rash noted. No erythema.  Psychiatric: She has a normal mood and affect. Her behavior is normal. Judgment and thought content normal.     Assessment/Plan This is a 61 year old female admitted for hyponatremia. 1.  Hyponatremia: Sodium 125; hypovolemic.  Appears secondary to poor p.o. intake.  Encourage regular diet as well as hydrate with normal saline.  Confusion has resolved 2.  Acute kidney injury: Hydrate with intravenous fluid.  Avoid nephrotoxic agents. 3.  Coronary artery disease: Stable; continue aspirin and Plavix.  (Review of records shows history GI bleed and iron deficiency anemia, both of which have resolved) 4.  Hypertension: Controlled; continue Coreg and lisinopril 5.  Hyperlipidemia: Continue statin therapy 6.  Depression: Continue BuSpar and Celexa.  Xanax as needed 7.  Chronic pain: Worsened secondary to fall.  Low-dose morphine as needed for severe pain. 8.  Falls: PT/OT eval for safety 9.  DVT prophylaxis: Heparin 10.  GI prophylaxis: Pantoprazole per home regimen The patient is a full code.  Time  spent on admission on his inpatient care proximally 45 minutes  Harrie Foreman, MD 10/11/2017, 3:34 AM

## 2017-10-11 NOTE — Progress Notes (Addendum)
[employer] [facility] [place] [facility]                                                                                                                                                                                  Patient Demographics   Legacy Kuznik, is a 61 y.o. female, DOB - [birth year], WUJ:[id number]  Admit date - 10/10/2017   Admitting Physician Arnaldo Natal, MD  Outpatient Primary MD for the patient is [place], [facility]   LOS - 0  Subjective: Treated with fall and some confusion She states that she fell yesterday and had difficulty getting up. Complains of reflux mental status now improved     Review of Systems:   CONSTITUTIONAL: No documented fever. No fatigue, weakness. No weight gain, no weight loss.  EYES: No blurry or double vision.  ENT: No tinnitus. No postnasal drip. No redness of the oropharynx.  RESPIRATORY: No cough, no wheeze, no hemoptysis. No dyspnea.  CARDIOVASCULAR: No chest pain. No orthopnea. No palpitations. No syncope.  GASTROINTESTINAL: No nausea, no vomiting or diarrhea. No abdominal pain. No melena or hematochezia.  Positive for heartburn  gENITOURINARY: No dysuria or hematuria.  ENDOCRINE: No polyuria or nocturia. No heat or cold intolerance.  HEMATOLOGY: No anemia. No bruising. No bleeding.  INTEGUMENTARY: No rashes. No lesions.  MUSCULOSKELETAL: No arthritis. No swelling. No gout.  NEUROLOGIC: No numbness, tingling, or ataxia. No seizure-type activity.  PSYCHIATRIC: No anxiety. No insomnia. No ADD.    Vitals:   Vitals:   10/11/17 0300 10/11/17 0400 10/11/17 0437 10/11/17 0540  BP: 91/69 94/72 99/69  (!) 97/55  Pulse: 71 71 73 72  Resp: 11 11 14 16   Temp:    (!) 97.4 F (36.3 C)  TempSrc:    Oral  SpO2: 96% 95% 96% 96%  Weight:      Height:        Wt Readings from Last 3 Encounters:  10/10/17 135 lb (61.2 kg)  09/30/17 135 lb (61.2 kg)  09/22/17 142 lb (64.4 kg)    No intake or output data in the 24  hours ending 10/11/17 1253  Physical Exam:   GENERAL: Pleasant-appearing in no apparent distress.  HEAD, EYES, EARS, NOSE AND THROAT: Atraumatic, normocephalic. Extraocular muscles are intact. Pupils equal and reactive to light. Sclerae anicteric. No conjunctival injection. No oro-pharyngeal erythema.  NECK: Supple. There is no jugular venous distention. No bruits, no lymphadenopathy, no thyromegaly.  HEART: Regular rate and rhythm,. No murmurs, no rubs, no clicks.  LUNGS: Clear to auscultation bilaterally. No rales or rhonchi. No wheezes.  ABDOMEN: Soft, flat, nontender, nondistended. Has good bowel sounds. No hepatosplenomegaly appreciated.  EXTREMITIES: No  evidence of any cyanosis, clubbing, or peripheral edema.  +2 pedal and radial pulses bilaterally.  Some bruising on the left knee NEUROLOGIC: The patient is alert, awake, and oriented x3 with no focal motor or sensory deficits appreciated bilaterally.  SKIN: Moist and warm with no rashes appreciated.  Psych: Not anxious, depressed LN: No inguinal LN enlargement    Antibiotics   Anti-infectives (From admission, onward)   None      Medications   Scheduled Meds: . atorvastatin  80 mg Oral Daily  . busPIRone  10 mg Oral Daily  . carvedilol  6.25 mg Oral BID  . citalopram  40 mg Oral Daily  . clopidogrel  75 mg Oral Daily  . docusate sodium  100 mg Oral BID  . DULoxetine  30 mg Oral Daily  . ferrous sulfate  325 mg Oral TID WC  . gabapentin  300 mg Oral TID  . heparin  5,000 Units Subcutaneous Q8H  . lisinopril  20 mg Oral Daily  . multivitamin with minerals  2 tablet Oral Daily  . pantoprazole  40 mg Oral Daily  . potassium chloride  10 mEq Oral Daily   Continuous Infusions: . sodium chloride 125 mL/hr at 10/11/17 0615   PRN Meds:.acetaminophen **OR** acetaminophen, ALPRAZolam, alum & mag hydroxide-simeth, morphine injection, nitroGLYCERIN, ondansetron **OR** ondansetron (ZOFRAN) IV, traMADol   Data Review:   Micro  Results No results found for this or any previous visit (from the past 240 hour(s)).  Radiology Reports Dg Chest 2 View  Result Date: 10/11/2017 CLINICAL DATA:  61 year old female with chest pain. EXAM: CHEST  2 VIEW COMPARISON:  Chest CT dated 09/30/2017 FINDINGS: The lungs are clear. There is no pleural effusion or pneumothorax. The cardiac silhouette is within normal limits. No acute osseous pathology. Atherosclerotic calcification of the abdominal aorta noted. IMPRESSION: No active cardiopulmonary disease. Electronically Signed   By: Elgie Collard M.D.   On: 10/11/2017 00:02   Dg Chest 2 View  Result Date: 09/22/2017 CLINICAL DATA:  Intermittent chest pressure for 2 weeks in a cigarette smoker. EXAM: CHEST  2 VIEW COMPARISON:  PA and lateral chest 07/28/2017 and 03/09/2016. FINDINGS: The lungs are clear. Heart size is normal. No pneumothorax or pleural fluid. Aortic atherosclerosis noted. No acute bony abnormality. IMPRESSION: No acute disease. Atherosclerosis. Electronically Signed   By: Drusilla Kanner M.D.   On: 09/22/2017 15:48   Dg Knee Complete 4 Views Left  Result Date: 10/11/2017 CLINICAL DATA:  Leg numbness, fell. EXAM: LEFT KNEE - COMPLETE 4+ VIEW COMPARISON:  LEFT knee radiograph September 04, 2014 FINDINGS: No evidence of fracture, dislocation, or joint effusion. Osteopenia. Femur intramedullary rod, trace loosening about proximal retaining screw. No evidence of arthropathy or other focal bone abnormality. Soft tissues are nonacute, mild vascular calcifications. IMPRESSION: No acute osseous process or advanced degenerative change. Osteopenia. Electronically Signed   By: Awilda Metro M.D.   On: 10/11/2017 00:04   Ct Chest Lung Cancer Screening Low Dose Wo Contrast  Result Date: 09/30/2017 CLINICAL DATA:  61 year old asymptomatic female current smoker with 45 pack-year smoking history. EXAM: CT CHEST WITHOUT CONTRAST LOW-DOSE FOR LUNG CANCER SCREENING TECHNIQUE:  Multidetector CT imaging of the chest was performed following the standard protocol without IV contrast. COMPARISON:  09/22/2017 chest radiograph. 05/01/2007 chest CT angiogram. FINDINGS: Cardiovascular: Normal heart size. No significant pericardial fluid/thickening. Left anterior descending, left circumflex and right coronary atherosclerosis. Atherosclerotic nonaneurysmal thoracic aorta. Normal caliber pulmonary arteries. Mediastinum/Nodes: No discrete thyroid nodules. Unremarkable esophagus. No  pathologically enlarged axillary, mediastinal or gross hilar lymph nodes, noting limited sensitivity for the detection of hilar adenopathy on this noncontrast study. Lungs/Pleura: No pneumothorax. No pleural effusion. Mild centrilobular emphysema. No acute consolidative airspace disease or lung masses. A few scattered tiny solid pulmonary nodules in both lungs, largest measuring 2.3 mm in volume derived mean diameter in the peripheral right upper lobe (series 3/ image 124). Upper abdomen: Small hiatal hernia. Punctate granulomatous calcifications throughout the spleen, unchanged. Musculoskeletal: No aggressive appearing focal osseous lesions. Severe T4 and mild T9 vertebral compression fractures are not appreciably changed since 03/09/2016 lateral chest radiograph. Mild thoracic spondylosis. IMPRESSION: 1. Lung-RADS 2-S, benign appearance or behavior. Continue annual screening with low-dose chest CT without contrast in 12 months. 2. The "S" modifier above refers to potentially clinically significant non lung cancer related findings. Specifically, three-vessel coronary atherosclerosis. 3. Small hiatal hernia. Aortic Atherosclerosis (ICD10-I70.0) and Emphysema (ICD10-J43.9). Electronically Signed   By: Delbert Phenix M.D.   On: 09/30/2017 15:32     CBC Recent Labs  Lab 10/11/17 0004  WBC 11.5*  HGB 11.3*  HCT 33.4*  PLT 236  MCV 85.0  MCH 28.7  MCHC 33.7  RDW 23.6*    Chemistries  Recent Labs  Lab  10/11/17 0004  NA 125*  K 4.3  CL 91*  CO2 23  GLUCOSE 78  BUN 14  CREATININE 1.09*  CALCIUM 8.9   ------------------------------------------------------------------------------------------------------------------ estimated creatinine clearance is 53 mL/min (A) (by C-G formula based on SCr of 1.09 mg/dL (H)). ------------------------------------------------------------------------------------------------------------------ No results for input(s): HGBA1C in the last 72 hours. ------------------------------------------------------------------------------------------------------------------ No results for input(s): CHOL, HDL, LDLCALC, TRIG, CHOLHDL, LDLDIRECT in the last 72 hours. ------------------------------------------------------------------------------------------------------------------ Recent Labs    10/11/17 0004  TSH 0.748   ------------------------------------------------------------------------------------------------------------------ No results for input(s): VITAMINB12, FOLATE, FERRITIN, TIBC, IRON, RETICCTPCT in the last 72 hours.  Coagulation profile No results for input(s): INR, PROTIME in the last 168 hours.  No results for input(s): DDIMER in the last 72 hours.  Cardiac Enzymes Recent Labs  Lab 10/11/17 0004  TROPONINI <0.03   ------------------------------------------------------------------------------------------------------------------ Invalid input(s): POCBNP    Assessment & Plan   AThis is a 61 year old female admitted for hyponatremia. 1.  Hyponatremia: Suspect related to poor p.o. Intake, not on any diuretics, continue IV fluids 2.    Generalized weakness and fatigue illness suspect due to hyponatremia, check vitamin D level PT evaluation pending 3.  Coronary artery disease: Stable; continue aspirin and Plavix.  (Review of records shows history GI bleed and iron deficiency anemia, both of which have resolved) 4.  Hypertension:  continue Coreg  and lisinopril 5.  Hyperlipidemia: Continue statin therapy 6.  Depression: Continue BuSpar and Celexa.  Xanax as needed 7.  Chronic pain: Continue current regimen pain seems to be under controlled 8.  Falls: PT/OT eval for safety 9.  DVT prophylaxis: Heparin 10.  GI prophylaxis: Pantoprazole per home regimen       Code Status Orders  (From admission, onward)        Start     Ordered   10/11/17 0600  Full code  Continuous     10/11/17 0559    Code Status History    Date Active Date Inactive Code Status Order ID Comments User Context   07/28/2017 14:13 07/30/2017 23:15 Full Code [id number]  Milagros Loll, MD ED   04/07/2017 00:22 04/09/2017 17:10 Full Code [id number]  Ihor Austin, MD Inpatient   10/14/2011 22:15 10/15/2011 17:27 Full Code [id number]  Netta Corrigan,  RN Inpatient           Consults none  DVT Prophylaxis heparin  Lab Results  Component Value Date   PLT 236 10/11/2017     Time Spent in minutes   45 minutes  Greater than 50% of time spent in care coordination and counseling patient regarding the condition and plan of care.   Auburn Bilberry M.D on 10/11/2017 at 12:53 PM  Between 7am to 6pm - Pager - [phone]  After 6pm go to [email or web address] - password [email or web address] [facility]  [facility]   Office  [phone]

## 2017-10-11 NOTE — Care Management Note (Signed)
Case Management Note  Patient Details  Name: Traci Duran MRN: [id number] Date of Birth: 08/31/1956  Subjective/Objective:     [name] was admitted from home with hyponatremia. She reports that she lives alone but that her grandson is very supportive. She drives herself and has no home health services. [name] has a walker and a shower chair at home. Her PCP is [facility]. She also sees Dr [name] at the [facility] in [place]. Her pharmacy is [facility] in [place]. [name] reports that in addition to Medicare A & B, she also has [employer] which is not reflected in her hospital record. [name] reports that she does not know where her [employer] card is. Case management will follow for discharge planning.                 Action/Plan:   Expected Discharge Date:                  Expected Discharge Plan:     In-House Referral:     Discharge planning Services     Post Acute Care Choice:    Choice offered to:     DME Arranged:    DME Agency:     HH Arranged:    HH Agency:     Status of Service:     If discussed at [employer] of Stay Meetings, dates discussed:    Additional Comments:  Benett Swoyer A, RN 10/11/2017, 3:35 PM

## 2017-10-11 NOTE — ED Notes (Signed)
Pt transport to 118

## 2017-10-12 DIAGNOSIS — E871 Hypo-osmolality and hyponatremia: Secondary | ICD-10-CM | POA: Diagnosis not present

## 2017-10-12 LAB — SODIUM: Sodium: 138 mmol/L (ref 135–145)

## 2017-10-12 NOTE — Discharge Summary (Signed)
[facility] - [place] at [facility]   PATIENT NAME: Traci Duran    MR#:  [id number]  DATE OF BIRTH:  05/30/1956  DATE OF ADMISSION:  10/10/2017 ADMITTING PHYSICIAN: Arnaldo Natal, MD  DATE OF DISCHARGE: 10/12/2017  PRIMARY CARE PHYSICIAN: [place], [facility]    ADMISSION DIAGNOSIS:  Hyponatremia [E87.1] Weakness [R53.1] Fall, initial encounter [W19.XXXA] Skin tear of elbow without complication, initial encounter [S51.019A]  DISCHARGE DIAGNOSIS:  Acute hyponatremia secondary to poor p.o. intake resolved  SECONDARY DIAGNOSIS:   Past Medical History:  Diagnosis Date  . Anemia   . Anxiety   . COPD (chronic obstructive pulmonary disease) (HCC)   . Coronary artery disease   . Depression   . Gastritis   . GERD (gastroesophageal reflux disease)   . Hemorrhoids   . Hyperlipidemia   . Hypertension   . PAD (peripheral artery disease) [facility])     HOSPITAL COURSE:   61 year old female admitted for hyponatremia.  1. acute hyponatremia: Suspect related to poor p.o. Intake, not on any diuretics -Received IV fluids -Even with sodium of 125--- 138 -Tolerating diet well.  2.Generalized weakness and fatigue illness suspect due to hyponatremia -Patient feels a lot better.  She is ambulating well -She does not want to see physical therapy here.  3. Coronary artery disease: Stable; continue aspirin and Plavix. (Review of records shows history GI bleed and iron deficiency anemia,both of which have resolved)  4. Hypertension:  continue Coreg and lisinopril  5. Hyperlipidemia: Continue statin therapy  6. Depression: Continue BuSpar and Celexa. Xanax as needed  7. Chronic pain: Continue current regimen pain seems to be under controlled  8.DVT prophylaxis: Heparin  9. GI prophylaxis: Pantoprazole per home regimen  Overall feels better.  Patient requesting to go home.  Hemodynamically stable. Discharge to home. Will have nurse  ambulate patient to make sure she is stable with her gait. CONSULTS OBTAINED:  Treatment Team:  Enedina Finner, MD  DRUG ALLERGIES:   Allergies  Allergen Reactions  . Chantix [Varenicline] Hives, Shortness Of Breath and Palpitations  . Diphenhydramine Hcl Shortness Of Breath  . Pregabalin Other (See Comments)    Falls  . Benadryl [Diphenhydramine Hcl] Rash  . Niacin Rash  . Trazodone Palpitations    DISCHARGE MEDICATIONS:   Current Discharge Medication List    CONTINUE these medications which have NOT CHANGED   Details  albuterol (PROVENTIL HFA;VENTOLIN HFA) 108 (90 Base) MCG/ACT inhaler Inhale 1-2 puffs into the lungs every 6 (six) hours as needed for wheezing or shortness of breath.    ALPRAZolam (XANAX) 1 MG tablet Take 1 mg by mouth 3 (three) times daily as needed. For anxiety     atorvastatin (LIPITOR) 80 MG tablet Take 80 mg by mouth daily.    busPIRone (BUSPAR) 10 MG tablet Take 10 mg by mouth daily.  Refills: 2    carvedilol (COREG) 6.25 MG tablet Take 6.25 mg by mouth 2 (two) times daily.    citalopram (CELEXA) 40 MG tablet Take 40 mg by mouth daily.    clopidogrel (PLAVIX) 75 MG tablet Take 75 mg by mouth daily.    DULoxetine (CYMBALTA) 30 MG capsule Take 30 mg by mouth daily.    ferrous sulfate 325 (65 FE) MG tablet Take 1 tablet (325 mg total) by mouth 3 (three) times daily with meals. Qty: 90 tablet, Refills: 3    gabapentin (NEURONTIN) 300 MG capsule Take 1 capsule by mouth 3 (three) times daily.  lisinopril (PRINIVIL,ZESTRIL) 20 MG tablet Take 20 mg by mouth daily.      Multiple Vitamin (MULTIVITAMIN WITH MINERALS) TABS tablet Take 2 tablets by mouth daily.    nitroGLYCERIN (NITROSTAT) 0.4 MG SL tablet Place under the tongue.    pantoprazole (PROTONIX) 20 MG tablet Take 20 mg by mouth daily.    potassium chloride (K-DUR) 10 MEQ tablet Take 1 tablet (10 mEq total) by mouth daily. Qty: 7 tablet, Refills: 0    traMADol (ULTRAM) 50 MG tablet Take 1  tablet (50 mg total) by mouth every 6 (six) hours as needed. Qty: 15 tablet, Refills: 0    Vitamin D, Ergocalciferol, (DRISDOL) 50000 units CAPS capsule TAKE 1 CAPSULE  50,000 UNITS TOTAL  BY MOUTH ONCE A WEEK Refills: 3        If you experience worsening of your admission symptoms, develop shortness of breath, life threatening emergency, suicidal or homicidal thoughts you must seek medical attention immediately by calling 911 or calling your MD immediately  if symptoms less severe.  You Must read complete instructions/literature along with all the possible adverse reactions/side effects for all the Medicines you take and that have been prescribed to you. Take any new Medicines after you have completely understood and accept all the possible adverse reactions/side effects.   Please note  You were cared for by a hospitalist during your hospital stay. If you have any questions about your discharge medications or the care you received while you were in the hospital after you are discharged, you can call the unit and asked to speak with the hospitalist on call if the hospitalist that took care of you is not available. Once you are discharged, your primary care physician will handle any further medical issues. Please note that NO REFILLS for any discharge medications will be authorized once you are discharged, as it is imperative that you return to your primary care physician (or establish a relationship with a primary care physician if you do not have one) for your aftercare needs so that they can reassess your need for medications and monitor your lab values. Today   SUBJECTIVE   Doing well  VITAL SIGNS:  Blood pressure (!) 150/82, pulse 70, temperature (!) 97.5 F (36.4 C), temperature source Oral, resp. rate 16, height 5\' 7"  (1.702 m), weight 63.5 kg (140 lb 1.6 oz), SpO2 98 %.  I/O:    Intake/Output Summary (Last 24 hours) at 10/12/2017 0919 Last data filed at 10/12/2017 0900 Gross per  24 hour  Intake 1679.81 ml  Output -  Net 1679.81 ml    PHYSICAL EXAMINATION:  GENERAL:  61 y.o.-year-old patient lying in the bed with no acute distress.  EYES: Pupils equal, round, reactive to light and accommodation. No scleral icterus. Extraocular muscles intact.  HEENT: Head atraumatic, normocephalic. Oropharynx and nasopharynx clear.  NECK:  Supple, no jugular venous distention. No thyroid enlargement, no tenderness.  LUNGS: Normal breath sounds bilaterally, no wheezing, rales,rhonchi or crepitation. No use of accessory muscles of respiration.  CARDIOVASCULAR: S1, S2 normal. No murmurs, rubs, or gallops.  ABDOMEN: Soft, non-tender, non-distended. Bowel sounds present. No organomegaly or mass.  EXTREMITIES: No pedal edema, cyanosis, or clubbing.  NEUROLOGIC: Cranial nerves II through XII are intact. Muscle strength 5/5 in all extremities. Sensation intact. Gait not checked.  PSYCHIATRIC: The patient is alert and oriented x 3.  SKIN: No obvious rash, lesion, or ulcer.   DATA REVIEW:   CBC  Recent Labs  Lab 10/11/17 0004  WBC 11.5*  HGB 11.3*  HCT 33.4*  PLT 236    Chemistries  Recent Labs  Lab 10/11/17 0004 10/12/17 0712  NA 125* 138  K 4.3  --   CL 91*  --   CO2 23  --   GLUCOSE 78  --   BUN 14  --   CREATININE 1.09*  --   CALCIUM 8.9  --     Microbiology Results   No results found for this or any previous visit (from the past 240 hour(s)).  RADIOLOGY:  Dg Chest 2 View  Result Date: 10/11/2017 CLINICAL DATA:  61 year old female with chest pain. EXAM: CHEST  2 VIEW COMPARISON:  Chest CT dated 09/30/2017 FINDINGS: The lungs are clear. There is no pleural effusion or pneumothorax. The cardiac silhouette is within normal limits. No acute osseous pathology. Atherosclerotic calcification of the abdominal aorta noted. IMPRESSION: No active cardiopulmonary disease. Electronically Signed   By: Elgie Collard M.D.   On: 10/11/2017 00:02   Dg Knee Complete 4 Views  Left  Result Date: 10/11/2017 CLINICAL DATA:  Leg numbness, fell. EXAM: LEFT KNEE - COMPLETE 4+ VIEW COMPARISON:  LEFT knee radiograph September 04, 2014 FINDINGS: No evidence of fracture, dislocation, or joint effusion. Osteopenia. Femur intramedullary rod, trace loosening about proximal retaining screw. No evidence of arthropathy or other focal bone abnormality. Soft tissues are nonacute, mild vascular calcifications. IMPRESSION: No acute osseous process or advanced degenerative change. Osteopenia. Electronically Signed   By: Awilda Metro M.D.   On: 10/11/2017 00:04     Management plans discussed with the patient, family and they are in agreement.  CODE STATUS:     Code Status Orders  (From admission, onward)        Start     Ordered   10/11/17 0600  Full code  Continuous     10/11/17 0559    Code Status History    Date Active Date Inactive Code Status Order ID Comments User Context   07/28/2017 14:13 07/30/2017 23:15 Full Code [id number]  Milagros Loll, MD ED   04/07/2017 00:22 04/09/2017 17:10 Full Code [id number]  Ihor Austin, MD Inpatient   10/14/2011 22:15 10/15/2011 17:27 Full Code [id number]  Netta Corrigan, RN Inpatient      TOTAL TIME TAKING CARE OF THIS PATIENT: *40* minutes.    Traci Duran M.D on 10/12/2017 at 9:19 AM  Between 7am to 6pm - Pager - [phone] After 6pm go to [email or web address] - Social research officer, government  [facility]  Office  [phone]  CC: Primary care physician; [name]

## 2017-10-12 NOTE — Progress Notes (Signed)
Discharge instructions along with home medications and follow up gone over with patient. She verbalized that she understood instructions. No prescriptions given to patient. IV and tele removed. Pt being discharged home on room air, no distress noted. Ammie Dalton, RN

## 2017-10-12 NOTE — Progress Notes (Signed)
Pt. Refused to wait in room for ride to get here. When this RN expained to the patient that it is against hospital policy, pt stated, "Well, im going to do it anyway.". Pt sitting at visitors entrance in no distress. Ammie Dalton, RN

## 2017-10-12 NOTE — Plan of Care (Signed)
Pt discharged via wheelchair to the visitor entrance in stable condition. Annett Fabian SN, RCC

## 2017-11-16 NOTE — ED Provider Notes (Signed)
[facility] Emergency Department Provider Note   ____________________________________________   First MD Initiated Contact with Patient 10/10/17 2358     (approximate)  I have reviewed the triage vital signs and the nursing notes.   HISTORY  Chief Complaint Fall and Chest Pain    HPI Traci Duran is a 61 y.o. female brought to the ED via EMS from home with a chief complaint of fall.  Patient with a past medical history of hypertension, COPD, CAD, hyperlipidemia who states she does not remember why she fell but complains of knee, elbow and chest pain.  Denies associated fever, chills, shortness of breath, abdominal pain, nausea, vomiting or diaphoresis.   Past Medical History:  Diagnosis Date  . Anemia   . Anxiety   . COPD (chronic obstructive pulmonary disease) ([place])   . Coronary artery disease   . Depression   . Gastritis   . GERD (gastroesophageal reflux disease)   . Hemorrhoids   . Hyperlipidemia   . Hypertension   . PAD (peripheral artery disease) [facility])     Patient Active Problem List   Diagnosis Date Noted  . Hyponatremia 10/11/2017  . Chest pain 07/28/2017  . Acute blood loss anemia 07/28/2017  . Demand ischemia ([place])   . Benign neoplasm of descending colon   . Polyp of sigmoid colon   . Age related osteoporosis 04/16/2017  . Iron deficiency anemia secondary to blood loss (chronic)   . GI bleed 04/06/2017  . Diastolic dysfunction 16/09/9603  . Opioid contract exists 06/27/2016  . Incidental lung nodule 01/18/2016  . 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 Hypovitaminosis D 08/29/2015  . Thyroid lesion 08/29/2015  . Major depressive disorder, single episode, unspecified 08/27/2015  . 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 Gastroesophageal reflux disease 01/26/2015  . Osteopenia 01/26/2015  . Anemia, unspecified 12/27/2014  . History of fall 12/27/2014  . Greater tuberosity of humerus fracture 10/24/2013    . AVF (arteriovenous fistula) ([place]) 11/08/2011  . Anxiety 10/24/2011  . Cerebrovascular disease 10/24/2011  . Dyslipidemia 10/24/2011  . Hypertension 10/24/2011  . Tobacco abuse 10/24/2011  . Palpitations 10/24/2011  . Renal artery stenosis ([place]) 10/24/2011  . Unstable angina ([place]) 10/14/2011  . Hyperlipidemia   . Chronic obstructive pulmonary disease ([place])   . Depression   . Peripheral arterial disease ([place])   . Coronary artery disease, non-occlusive     Past Surgical History:  Procedure Laterality Date  . APPENDECTOMY    . COLONOSCOPY WITH PROPOFOL N/A 06/09/2017   Procedure: COLONOSCOPY WITH PROPOFOL;  Surgeon: Lucilla Lame, MD;  Location: [facility] ENDOSCOPY;  Service: Endoscopy;  Laterality: N/A;  . ESOPHAGOGASTRODUODENOSCOPY N/A 07/30/2017   Procedure: ESOPHAGOGASTRODUODENOSCOPY (EGD);  Surgeon: Lin Landsman, MD;  Location: [facility] ENDOSCOPY;  Service: Gastroenterology;  Laterality: N/A;  . ESOPHAGOGASTRODUODENOSCOPY (EGD) WITH PROPOFOL N/A 04/09/2017   Procedure: ESOPHAGOGASTRODUODENOSCOPY (EGD) WITH PROPOFOL;  Surgeon: Lucilla Lame, MD;  Location: [facility] ENDOSCOPY;  Service: Endoscopy;  Laterality: N/A;  . HIP FRACTURE SURGERY      Prior to Admission medications   Medication Sig Start Date End Date Taking? Authorizing Provider  albuterol (PROVENTIL HFA;VENTOLIN HFA) 108 (90 Base) MCG/ACT inhaler Inhale 1-2 puffs into the lungs every 6 (six) hours as needed for wheezing or shortness of breath.   Yes [provider]  ALPRAZolam Duanne Moron) 1 MG tablet Take 1 mg by mouth 3 (three) times daily as needed. For anxiety    Yes [provider]  atorvastatin (LIPITOR)  80 MG tablet Take 80 mg by mouth daily.   Yes [provider]  busPIRone (BUSPAR) 10 MG tablet Take 10 mg by mouth daily.  04/30/17  Yes [provider]  carvedilol (COREG) 6.25 MG tablet Take 6.25 mg by mouth 2 (two) times daily. 05/28/17  Yes [provider]  citalopram (CELEXA) 40 MG  tablet Take 40 mg by mouth daily.   Yes [provider]  clopidogrel (PLAVIX) 75 MG tablet Take 75 mg by mouth daily.   Yes [provider]  DULoxetine (CYMBALTA) 30 MG capsule Take 30 mg by mouth daily. 05/28/17  Yes [provider]  ferrous sulfate 325 (65 FE) MG tablet Take 1 tablet (325 mg total) by mouth 3 (three) times daily with meals. 07/30/17  Yes Fritzi Mandes, MD  gabapentin (NEURONTIN) 300 MG capsule Take 1 capsule by mouth 3 (three) times daily.    Yes [provider]  lisinopril (PRINIVIL,ZESTRIL) 20 MG tablet Take 20 mg by mouth daily.     Yes [provider]  Multiple Vitamin (MULTIVITAMIN WITH MINERALS) TABS tablet Take 2 tablets by mouth daily.   Yes [provider]  nitroGLYCERIN (NITROSTAT) 0.4 MG SL tablet Place under the tongue. 01/24/16  Yes [provider]  pantoprazole (PROTONIX) 20 MG tablet Take 20 mg by mouth daily.   Yes [provider]  potassium chloride (K-DUR) 10 MEQ tablet Take 1 tablet (10 mEq total) by mouth daily. 05/28/17  Yes Earleen Newport, MD  traMADol (ULTRAM) 50 MG tablet Take 1 tablet (50 mg total) by mouth every 6 (six) hours as needed. 07/30/17  Yes Fritzi Mandes, MD  Vitamin D, Ergocalciferol, (DRISDOL) 50000 units CAPS capsule TAKE 1 CAPSULE  50,000 UNITS TOTAL  BY MOUTH ONCE A WEEK 05/01/17  Yes [provider]    Allergies Chantix [varenicline]; Diphenhydramine hcl; Pregabalin; Benadryl [diphenhydramine hcl]; Niacin; and Trazodone  Family History  Problem Relation Age of Onset  . Heart failure Mother   . Heart attack Mother        mother died at 43 of an MI  . Heart attack Father        father died at 86 of an MI  . Heart attack Brother        Brother had an MI in his 42s    Social History Social History   Tobacco Use  . Smoking status: Current Some Day Smoker    Packs/day: 1.00    Years: 45.00    Pack years: 45.00    Types: Cigarettes  . Smokeless  tobacco: Never Used  . Tobacco comment: she feels that the 21 nicotine made her jittery and we suggested a lower level patch  Substance Use Topics  . Alcohol use: No  . Drug use: Yes    Types: Marijuana    Comment: once in a while, about 2 weeks ago    Review of Systems  Constitutional: No fever/chills. Eyes: No visual changes. ENT: No sore throat. Cardiovascular: Positive for chest pain. Respiratory: Denies shortness of breath. Gastrointestinal: No abdominal pain.  No nausea, no vomiting.  No diarrhea.  No constipation. Genitourinary: Negative for dysuria. Musculoskeletal: Positive for knee and elbow pain.  Negative for back pain. Skin: Negative for rash. Neurological: Negative for headaches, focal weakness or numbness.   ____________________________________________   PHYSICAL EXAM:  VITAL SIGNS: ED Triage Vitals  Enc Vitals Group     BP 10/10/17 2321 114/78     Pulse Rate 10/10/17  2316 72     Resp 10/10/17 2316 18     Temp 10/10/17 2316 97.7 F (36.5 C)     Temp Source 10/11/17 0540 Oral     SpO2 10/10/17 2316 98 %     Weight 10/10/17 2319 135 lb (61.2 kg)     Height 10/10/17 2319 5\' 7"  (1.702 m)     Head Circumference --      Peak Flow --      Pain Score 10/10/17 2314 9     Pain Loc --      Pain Edu? --      Excl. in [place]? --     Constitutional: Alert and oriented. Well appearing and in no acute distress. Eyes: Conjunctivae are normal. PERRL. EOMI. Head: Atraumatic. Nose: No congestion/rhinnorhea. Mouth/Throat: Mucous membranes are moist.  Oropharynx non-erythematous. Neck: No stridor.  No cervical spine tenderness to palpation. Cardiovascular: Normal rate, regular rhythm. Grossly normal heart sounds.  Good peripheral circulation. Respiratory: Normal respiratory effort.  No retractions. Lungs CTAB. Gastrointestinal: Soft and nontender. No distention. No abdominal bruits. No CVA tenderness. Musculoskeletal: Left knee tender to palpation.  No joint  effusions. Neurologic:  Normal speech and language. No gross focal neurologic deficits are appreciated. Skin:  Skin is warm, dry and intact. No rash noted. Psychiatric: Mood and affect are normal. Speech and behavior are normal.  ____________________________________________   LABS (all labs ordered are listed, but only abnormal results are displayed)  Labs Reviewed  BASIC METABOLIC PANEL - Abnormal; Notable for the following components:      Result Value   Sodium 125 (*)    Chloride 91 (*)    Creatinine, Ser 1.09 (*)    GFR calc non Af Amer 54 (*)    All other components within normal limits  CBC - Abnormal; Notable for the following components:   WBC 11.5 (*)    Hemoglobin 11.3 (*)    HCT 33.4 (*)    RDW 23.6 (*)    All other components within normal limits  TROPONIN I  ETHANOL  TSH  SODIUM   ____________________________________________  EKG  ED ECG REPORT I, Sears Oran J, the attending physician, personally viewed and interpreted this ECG.   Date: 10/10/2017  EKG Time: 2318  Rate: 70  Rhythm: normal EKG, normal sinus rhythm  Axis: Normal  Intervals:none  ST&T Change: Nonspecific  ____________________________________________  RADIOLOGY  Chest x-ray and left knee films were unremarkable ____________________________________________   PROCEDURES  Procedure(s) performed: None  Procedures  Critical Care performed: No  ____________________________________________   INITIAL IMPRESSION / ASSESSMENT AND PLAN / ED COURSE  As part of my medical decision making, I reviewed the following data within the [place] notes reviewed and incorporated, Labs reviewed, EKG interpreted, Radiograph reviewed, Discussed with admitting physician and Notes from prior ED visits.   61 year old female status post fall at home with complaints of chest, elbow and knee pain.  She was found to be quite hyponatremic which is new from baseline.  IV fluid  resuscitation initiated and hospitalist services was contacted to evaluate patient in the emergency department for admission.      ____________________________________________   FINAL CLINICAL IMPRESSION(S) / ED DIAGNOSES  Final diagnoses:  Hyponatremia  Fall, initial encounter  Skin tear of elbow without complication, initial encounter  Weakness     ED Discharge Orders        Ordered    Increase activity slowly     10/12/17 0919  Note:  This document was prepared using Dragon voice recognition software and may include unintentional dictation errors.    Paulette Blanch, MD 11/16/17 [phone]

## 2017-11-26 ENCOUNTER — Inpatient Hospital Stay: Payer: Medicare Other | Attending: Oncology

## 2017-11-26 ENCOUNTER — Inpatient Hospital Stay: Payer: Medicare Other | Admitting: Oncology

## 2018-04-02 ENCOUNTER — Emergency Department
Admission: EM | Admit: 2018-04-02 | Discharge: 2018-04-02 | Disposition: A | Discharge status: Home / Self Care | Payer: Medicare Other | Attending: Student in an Organized Health Care Education/Training Program | Admitting: Student in an Organized Health Care Education/Training Program

## 2018-04-02 ENCOUNTER — Encounter: Payer: Self-pay | Admitting: Emergency Medicine

## 2018-04-02 ENCOUNTER — Emergency Department: Payer: Medicare Other

## 2018-04-02 ENCOUNTER — Other Ambulatory Visit: Payer: Self-pay

## 2018-04-02 DIAGNOSIS — I2510 Atherosclerotic heart disease of native coronary artery without angina pectoris: Secondary | ICD-10-CM | POA: Insufficient documentation

## 2018-04-02 DIAGNOSIS — F419 Anxiety disorder, unspecified: Secondary | ICD-10-CM | POA: Diagnosis not present

## 2018-04-02 DIAGNOSIS — F17210 Nicotine dependence, cigarettes, uncomplicated: Secondary | ICD-10-CM | POA: Insufficient documentation

## 2018-04-02 DIAGNOSIS — Z7902 Long term (current) use of antithrombotics/antiplatelets: Secondary | ICD-10-CM | POA: Diagnosis not present

## 2018-04-02 DIAGNOSIS — F329 Major depressive disorder, single episode, unspecified: Secondary | ICD-10-CM | POA: Diagnosis not present

## 2018-04-02 DIAGNOSIS — I10 Essential (primary) hypertension: Secondary | ICD-10-CM | POA: Insufficient documentation

## 2018-04-02 DIAGNOSIS — R079 Chest pain, unspecified: Secondary | ICD-10-CM | POA: Diagnosis present

## 2018-04-02 DIAGNOSIS — Z79899 Other long term (current) drug therapy: Secondary | ICD-10-CM | POA: Diagnosis not present

## 2018-04-02 DIAGNOSIS — J449 Chronic obstructive pulmonary disease, unspecified: Secondary | ICD-10-CM | POA: Diagnosis not present

## 2018-04-02 LAB — CBC
HCT: 37.5 % (ref 35.0–47.0)
Hemoglobin: 12.4 g/dL (ref 12.0–16.0)
MCH: 27.2 pg (ref 26.0–34.0)
MCHC: 33.1 g/dL (ref 32.0–36.0)
MCV: 82.4 fL (ref 80.0–100.0)
Platelets: 257 10*3/uL (ref 150–440)
RBC: 4.56 MIL/uL (ref 3.80–5.20)
RDW: 14.3 % (ref 11.5–14.5)
WBC: 8.5 10*3/uL (ref 3.6–11.0)

## 2018-04-02 LAB — TROPONIN I
Troponin I: <0.03 ng/mL (ref <0.03)
Troponin I: <0.03 ng/mL (ref <0.03)

## 2018-04-02 LAB — BASIC METABOLIC PANEL
Anion gap: 8 (ref 5–15)
BUN: 9 mg/dL (ref 6–20)
CO2: 22 mmol/L (ref 22–32)
Calcium: 9.2 mg/dL (ref 8.9–10.3)
Chloride: 105 mmol/L (ref 101–111)
Creatinine, Ser: 0.73 mg/dL (ref 0.44–1.00)
GFR calc Af Amer: >60 mL/min (ref >60)
GLUCOSE: 111 mg/dL — AB (ref 65–99)
Potassium: 3.8 mmol/L (ref 3.5–5.1)
Sodium: 135 mmol/L (ref 135–145)

## 2018-04-02 MED ORDER — TRAMADOL HCL 50 MG PO TABS: 50.0000 mg | ORAL_TABLET | Freq: Four times a day (QID) | ORAL | 0 refills | Status: DC | PRN

## 2018-04-02 MED ORDER — HYDROCODONE-ACETAMINOPHEN 5-325 MG PO TABS
1.0000 | ORAL_TABLET | Freq: Once | ORAL | Status: AC
  Administered 2018-04-02: 1 via ORAL
  Filled 2018-04-02: qty 1

## 2018-04-02 MED ORDER — IPRATROPIUM-ALBUTEROL 0.5-2.5 (3) MG/3ML IN SOLN
3.0000 mL | Freq: Once | RESPIRATORY_TRACT | Status: AC
  Administered 2018-04-02: 3 mL via RESPIRATORY_TRACT
  Filled 2018-04-02: qty 3

## 2018-04-02 MED ORDER — PREDNISONE 20 MG PO TABS
60.0000 mg | ORAL_TABLET | Freq: Once | ORAL | Status: AC
  Administered 2018-04-02: 60 mg via ORAL
  Filled 2018-04-02: qty 3

## 2018-04-02 MED ORDER — LORAZEPAM 1 MG PO TABS
1.0000 mg | ORAL_TABLET | Freq: Once | ORAL | Status: AC
  Administered 2018-04-02: 1 mg via ORAL
  Filled 2018-04-02: qty 1

## 2018-04-02 MED ORDER — CARVEDILOL 6.25 MG PO TABS
6.2500 mg | ORAL_TABLET | Freq: Two times a day (BID) | ORAL | Status: DC
  Administered 2018-04-02: 6.25 mg via ORAL

## 2018-04-02 MED ORDER — CARVEDILOL 6.25 MG PO TABS
ORAL_TABLET | ORAL | Status: AC
  Filled 2018-04-02: qty 1

## 2018-04-02 MED ORDER — CARVEDILOL 6.25 MG PO TABS: 6.2500 mg | ORAL_TABLET | Freq: Two times a day (BID) | ORAL | Status: DC

## 2018-04-02 MED ORDER — ALBUTEROL SULFATE HFA 108 (90 BASE) MCG/ACT IN AERS: 2.0000 | INHALATION_SPRAY | Freq: Four times a day (QID) | RESPIRATORY_TRACT | 2 refills | Status: DC | PRN

## 2018-04-02 MED ORDER — PREDNISONE 20 MG PO TABS: 40.0000 mg | ORAL_TABLET | Freq: Every day | ORAL | 0 refills | Status: AC

## 2018-04-02 NOTE — ED Provider Notes (Signed)
[facility] Emergency Department Provider Note    First MD Initiated Contact with Patient 04/02/18 1810     (approximate)  I have reviewed the triage vital signs and the nursing notes.   HISTORY  Chief Complaint Chest Pain    HPI Traci Duran is a 62 y.o. female history of COPD, CAD, anxiety as well as GERD presents to the ER with chief complaint of chest pain that she started feeling yesterday but became more constant today around 2:00.  States that she was lying down and the chest pain started.  It is worse with movement.  States it worse with taking a deep breath.  States that she smokes 1 pack per's day.  Says she is also been concerned that her blood pressures been so elevated her past several days despite her taking her medications.  Denies any pain radiating through to her back or to her shoulders.  No pain going to her jaw.  No nausea or diaphoresis.  Denies any abdominal pain.  No recent fevers  Past Medical History:  Diagnosis Date  . Anemia   . Anxiety   . COPD (chronic obstructive pulmonary disease) ([place])   . Coronary artery disease   . Depression   . Gastritis   . GERD (gastroesophageal reflux disease)   . Hemorrhoids   . Hyperlipidemia   . Hypertension   . PAD (peripheral artery disease) (HCC)    Family History  Problem Relation Age of Onset  . Heart failure Mother   . Heart attack Mother        mother died at 38 of an MI  . Heart attack Father        father died at 28 of an MI  . Heart attack Brother        Brother had an MI in his 26s   Past Surgical History:  Procedure Laterality Date  . APPENDECTOMY    . COLONOSCOPY WITH PROPOFOL N/A 06/09/2017   Procedure: COLONOSCOPY WITH PROPOFOL;  Surgeon: Lucilla Lame, MD;  Location: [facility] ENDOSCOPY;  Service: Endoscopy;  Laterality: N/A;  . ESOPHAGOGASTRODUODENOSCOPY N/A 07/30/2017   Procedure: ESOPHAGOGASTRODUODENOSCOPY (EGD);  Surgeon: Lin Landsman, MD;  Location: [facility]  ENDOSCOPY;  Service: Gastroenterology;  Laterality: N/A;  . ESOPHAGOGASTRODUODENOSCOPY (EGD) WITH PROPOFOL N/A 04/09/2017   Procedure: ESOPHAGOGASTRODUODENOSCOPY (EGD) WITH PROPOFOL;  Surgeon: Lucilla Lame, MD;  Location: [facility] ENDOSCOPY;  Service: Endoscopy;  Laterality: N/A;  . HIP FRACTURE SURGERY     Patient Active Problem List   Diagnosis Date Noted  . Hyponatremia 10/11/2017  . Chest pain 07/28/2017  . Acute blood loss anemia 07/28/2017  . Demand ischemia ([place])   . Benign neoplasm of descending colon   . Polyp of sigmoid colon   . Age related osteoporosis 04/16/2017  . Iron deficiency anemia secondary to blood loss (chronic)   . GI bleed 04/06/2017  . Diastolic dysfunction 71/05/2693  . Opioid contract exists 06/27/2016  . Incidental lung nodule 01/18/2016  . 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 Hypovitaminosis D 08/29/2015  . Thyroid lesion 08/29/2015  . Major depressive disorder, single episode, unspecified 08/27/2015  . 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 Gastroesophageal reflux disease 01/26/2015  . Osteopenia 01/26/2015  . Anemia, unspecified 12/27/2014  . History of fall 12/27/2014  . Greater tuberosity of humerus fracture 10/24/2013  . AVF (arteriovenous fistula) ([place]) 11/08/2011  . Anxiety 10/24/2011  . Cerebrovascular disease 10/24/2011  . Dyslipidemia 10/24/2011  . Hypertension 10/24/2011  .  Tobacco abuse 10/24/2011  . Palpitations 10/24/2011  . Renal artery stenosis ([place]) 10/24/2011  . Unstable angina ([place]) 10/14/2011  . Hyperlipidemia   . Chronic obstructive pulmonary disease ([place])   . Depression   . Peripheral arterial disease ([place])   . Coronary artery disease, non-occlusive       Prior to Admission medications   Medication Sig Start Date End Date Taking? Authorizing Provider  albuterol (PROVENTIL HFA;VENTOLIN HFA) 108 (90 Base) MCG/ACT inhaler Inhale 1-2 puffs into the lungs every 6 (six) hours as needed for wheezing or  shortness of breath.    [provider]  albuterol (PROVENTIL HFA;VENTOLIN HFA) 108 (90 Base) MCG/ACT inhaler Inhale 2 puffs into the lungs every 6 (six) hours as needed for wheezing or shortness of breath. 04/02/18   Merlyn Lot, MD  ALPRAZolam Duanne Moron) 1 MG tablet Take 1 mg by mouth 3 (three) times daily as needed. For anxiety     [provider]  atorvastatin (LIPITOR) 80 MG tablet Take 80 mg by mouth daily.    [provider]  busPIRone (BUSPAR) 10 MG tablet Take 10 mg by mouth daily.  04/30/17   [provider]  carvedilol (COREG) 6.25 MG tablet Take 6.25 mg by mouth 2 (two) times daily. 05/28/17   [provider]  citalopram (CELEXA) 40 MG tablet Take 40 mg by mouth daily.    [provider]  clopidogrel (PLAVIX) 75 MG tablet Take 75 mg by mouth daily.    [provider]  DULoxetine (CYMBALTA) 30 MG capsule Take 30 mg by mouth daily. 05/28/17   [provider]  ferrous sulfate 325 (65 FE) MG tablet Take 1 tablet (325 mg total) by mouth 3 (three) times daily with meals. 07/30/17   Fritzi Mandes, MD  gabapentin (NEURONTIN) 300 MG capsule Take 1 capsule by mouth 3 (three) times daily.     [provider]  lisinopril (PRINIVIL,ZESTRIL) 20 MG tablet Take 20 mg by mouth daily.      [provider]  Multiple Vitamin (MULTIVITAMIN WITH MINERALS) TABS tablet Take 2 tablets by mouth daily.    [provider]  nitroGLYCERIN (NITROSTAT) 0.4 MG SL tablet Place under the tongue. 01/24/16   [provider]  pantoprazole (PROTONIX) 20 MG tablet Take 20 mg by mouth daily.    [provider]  potassium chloride (K-DUR) 10 MEQ tablet Take 1 tablet (10 mEq total) by mouth daily. 05/28/17   Earleen Newport, MD  predniSONE (DELTASONE) 20 MG tablet Take 2 tablets (40 mg total) by mouth daily for 5 days. 04/02/18 04/07/18  Merlyn Lot, MD  traMADol (ULTRAM) 50 MG tablet Take 1 tablet (50 mg total)  by mouth every 6 (six) hours as needed. 07/30/17   Fritzi Mandes, MD  traMADol (ULTRAM) 50 MG tablet Take 1 tablet (50 mg total) by mouth every 6 (six) hours as needed. Do not fill without albuterol and prednisone 04/02/18 04/02/19  Merlyn Lot, MD  Vitamin D, Ergocalciferol, (DRISDOL) 50000 units CAPS capsule TAKE 1 CAPSULE  50,000 UNITS TOTAL  BY MOUTH ONCE A WEEK 05/01/17   [provider]    Allergies Chantix [varenicline]; Diphenhydramine hcl; Pregabalin; Benadryl [diphenhydramine hcl]; Niacin; and Trazodone    Social History Social History   Tobacco Use  . Smoking status: Current Some Day Smoker    Packs/day: 1.00    Years: 45.00    Pack years: 45.00    Types: Cigarettes  . Smokeless tobacco: Never Used  .  Tobacco comment: she feels that the 21 nicotine made her jittery and we suggested a lower level patch  Substance Use Topics  . Alcohol use: No  . Drug use: Yes    Types: Marijuana    Comment: once in a while, about 2 weeks ago    Review of Systems Patient denies headaches, rhinorrhea, blurry vision, numbness, shortness of breath, chest pain, edema, cough, abdominal pain, nausea, vomiting, diarrhea, dysuria, fevers, rashes or hallucinations unless otherwise stated above in HPI. ____________________________________________   PHYSICAL EXAM:  VITAL SIGNS: Vitals:   04/02/18 1931 04/02/18 2008  BP: (!) 172/92 (!) 171/99  Pulse: 70 64  Resp: 16   Temp:    SpO2: 96%     Constitutional: Alert and oriented. Well appearing and in no acute distress. Eyes: Conjunctivae are normal.  Head: Atraumatic. Nose: No congestion/rhinnorhea. Mouth/Throat: Mucous membranes are moist.   Neck: No stridor. Painless ROM.  Cardiovascular: Normal rate, regular rhythm. Grossly normal heart sounds.  Good peripheral circulation. Respiratory: Normal respiratory effort.  No retractions. Lungs with coarse bilateral expiratory wheeze Gastrointestinal: Soft and nontender. No  distention. No abdominal bruits. No CVA tenderness. Genitourinary:  Musculoskeletal: No lower extremity tenderness nor edema.  No joint effusions. Neurologic:  Normal speech and language. No gross focal neurologic deficits are appreciated. No facial droop Skin:  Skin is warm, dry and intact. No rash noted. Psychiatric: Mood and affect are normal. Speech and behavior are normal.  ____________________________________________   LABS (all labs ordered are listed, but only abnormal results are displayed)  Results for orders placed or performed during the hospital encounter of 04/02/18 (from the past 24 hour(s))  Basic metabolic panel     Status: Abnormal   Collection Time: 04/02/18  5:32 PM  Result Value Ref Range   Sodium 135 135 - 145 mmol/L   Potassium 3.8 3.5 - 5.1 mmol/L   Chloride 105 101 - 111 mmol/L   CO2 22 22 - 32 mmol/L   Glucose, Bld 111 (H) 65 - 99 mg/dL   BUN 9 6 - 20 mg/dL   Creatinine, Ser 0.73 0.44 - 1.00 mg/dL   Calcium 9.2 8.9 - 10.3 mg/dL   GFR calc non Af Amer >60 >60 mL/min   GFR calc Af Amer >60 >60 mL/min   Anion gap 8 5 - 15  CBC     Status: None   Collection Time: 04/02/18  5:32 PM  Result Value Ref Range   WBC 8.5 3.6 - 11.0 K/uL   RBC 4.56 3.80 - 5.20 MIL/uL   Hemoglobin 12.4 12.0 - 16.0 g/dL   HCT 37.5 35.0 - 47.0 %   MCV 82.4 80.0 - 100.0 fL   MCH 27.2 26.0 - 34.0 pg   MCHC 33.1 32.0 - 36.0 g/dL   RDW 14.3 11.5 - 14.5 %   Platelets 257 150 - 440 K/uL  Troponin I     Status: None   Collection Time: 04/02/18  5:32 PM  Result Value Ref Range   Troponin I <0.03 <0.03 ng/mL  Troponin I     Status: None   Collection Time: 04/02/18  8:21 PM  Result Value Ref Range   Troponin I <0.03 <0.03 ng/mL   ____________________________________________  EKG My review and personal interpretation at Time: 17:32   Indication: chest pain  Rate: 75  Rhythm: sinus Axis: normal Other: normal intervals, no stemi,   ____________________________________________  RADIOLOGY  I personally reviewed all radiographic images ordered to evaluate for the above  acute complaints and reviewed radiology reports and findings.  These findings were personally discussed with the patient.  Please see medical record for radiology report.  ____________________________________________   PROCEDURES  Procedure(s) performed:  Procedures    Critical Care performed: no ____________________________________________   INITIAL IMPRESSION / ASSESSMENT AND PLAN / ED COURSE  Pertinent labs & imaging results that were available during my care of the patient were reviewed by me and considered in my medical decision making (see chart for details).  DDX: ACS, pericarditis, esophagitis, boerhaaves, pe, dissection, pna, bronchitis, costochondritis   Traci Duran is a 62 y.o. who presents to the ED with chest pains as described above.  Patient is AFVSS in ED. Exam as above. Given current presentation have considered the above differential.  Mildly hypertensive.  EKG shows no evidence of acute ischemia.  Initial troponin is negative.  Seems slightly atypical for cardiac chest pain but patient does have significant risk factors.  No history of stents or bypass.  Patient with his a very heavy smoker therefore will trial nebulizer treatment and symptomatic management to evaluate for component of bronchitis.  Not clinically consistent with dissection or PE.  Patient also with a history of underlying anxiety and seems to be very anxious about her high blood pressure therefore she was given dose of her home Xanax for symptomatic treatment as I also think this will help with her blood pressure.  Patient with a heart score of 3 versus 4 therefore will order serial enzymes to further risk stratify.  Clinical Course as of Apr 02 2120  Fri Apr 02, 2018  2120 Repeat troponin negative.  Patient currently pain-free.  Had significant improvement  after nebulizer treatment.  Will treat for bronchitis.  At this point I do believe patient stable and appropriate for outpatient follow-up.   [PR]    Clinical Course User Index [PR] Merlyn Lot, MD     As part of my medical decision making, I reviewed the following data within the [place] notes reviewed and incorporated, Labs reviewed, notes from prior ED visits and [place] Controlled Substance Database   ____________________________________________   FINAL CLINICAL IMPRESSION(S) / ED DIAGNOSES  Final diagnoses:  Chest pain, unspecified type      NEW MEDICATIONS STARTED DURING THIS VISIT:  New Prescriptions   ALBUTEROL (PROVENTIL HFA;VENTOLIN HFA) 108 (90 BASE) MCG/ACT INHALER    Inhale 2 puffs into the lungs every 6 (six) hours as needed for wheezing or shortness of breath.   PREDNISONE (DELTASONE) 20 MG TABLET    Take 2 tablets (40 mg total) by mouth daily for 5 days.   TRAMADOL (ULTRAM) 50 MG TABLET    Take 1 tablet (50 mg total) by mouth every 6 (six) hours as needed. Do not fill without albuterol and prednisone     Note:  This document was prepared using Dragon voice recognition software and may include unintentional dictation errors.    Merlyn Lot, MD 04/02/18 2122

## 2018-04-02 NOTE — ED Triage Notes (Signed)
Pt to ED via POV c/o chest pain that started today around 1400. Pt states that she was laying down when the chest pain started. Pt states that the pain comes and goes. Pt denies radiation of pain. Pt also states that her blood pressure has been elevated for the past 2 days. Pt is in NAD at this time.

## 2018-06-22 ENCOUNTER — Other Ambulatory Visit: Payer: Self-pay

## 2018-06-22 ENCOUNTER — Emergency Department
Admission: EM | Admit: 2018-06-22 | Discharge: 2018-06-22 | Disposition: A | Discharge status: Home / Self Care | Payer: Medicare Other | Attending: Emergency Medicine | Admitting: Emergency Medicine

## 2018-06-22 ENCOUNTER — Emergency Department: Payer: Medicare Other

## 2018-06-22 DIAGNOSIS — Z7902 Long term (current) use of antithrombotics/antiplatelets: Secondary | ICD-10-CM | POA: Diagnosis not present

## 2018-06-22 DIAGNOSIS — I2510 Atherosclerotic heart disease of native coronary artery without angina pectoris: Secondary | ICD-10-CM | POA: Diagnosis not present

## 2018-06-22 DIAGNOSIS — I10 Essential (primary) hypertension: Secondary | ICD-10-CM | POA: Diagnosis not present

## 2018-06-22 DIAGNOSIS — R110 Nausea: Secondary | ICD-10-CM | POA: Diagnosis not present

## 2018-06-22 DIAGNOSIS — J449 Chronic obstructive pulmonary disease, unspecified: Secondary | ICD-10-CM | POA: Diagnosis not present

## 2018-06-22 DIAGNOSIS — R61 Generalized hyperhidrosis: Secondary | ICD-10-CM | POA: Insufficient documentation

## 2018-06-22 DIAGNOSIS — R079 Chest pain, unspecified: Secondary | ICD-10-CM | POA: Diagnosis present

## 2018-06-22 DIAGNOSIS — F1210 Cannabis abuse, uncomplicated: Secondary | ICD-10-CM | POA: Diagnosis not present

## 2018-06-22 DIAGNOSIS — F17210 Nicotine dependence, cigarettes, uncomplicated: Secondary | ICD-10-CM | POA: Insufficient documentation

## 2018-06-22 DIAGNOSIS — Z79899 Other long term (current) drug therapy: Secondary | ICD-10-CM | POA: Diagnosis not present

## 2018-06-22 LAB — COMPREHENSIVE METABOLIC PANEL
ALT: 12 U/L (ref 0–44)
ANION GAP: 9 (ref 5–15)
AST: 22 U/L (ref 15–41)
Albumin: 3.9 g/dL (ref 3.5–5.0)
Alkaline Phosphatase: 78 U/L (ref 38–126)
BUN: 9 mg/dL (ref 8–23)
CO2: 25 mmol/L (ref 22–32)
Calcium: 9 mg/dL (ref 8.9–10.3)
Chloride: 104 mmol/L (ref 98–111)
Creatinine, Ser: 0.97 mg/dL (ref 0.44–1.00)
GFR calc Af Amer: >60 mL/min (ref >60)
GFR calc non Af Amer: >60 mL/min (ref >60)
Glucose, Bld: 120 mg/dL — ABNORMAL HIGH (ref 70–99)
POTASSIUM: 3.1 mmol/L — AB (ref 3.5–5.1)
SODIUM: 138 mmol/L (ref 135–145)
TOTAL PROTEIN: 7 g/dL (ref 6.5–8.1)
Total Bilirubin: 0.4 mg/dL (ref 0.3–1.2)

## 2018-06-22 LAB — CBC
HCT: 31.9 % — ABNORMAL LOW (ref 35.0–47.0)
Hemoglobin: 10.1 g/dL — ABNORMAL LOW (ref 12.0–16.0)
MCH: 24.2 pg — ABNORMAL LOW (ref 26.0–34.0)
MCHC: 31.8 g/dL — ABNORMAL LOW (ref 32.0–36.0)
MCV: 76.2 fL — ABNORMAL LOW (ref 80.0–100.0)
PLATELETS: 271 10*3/uL (ref 150–440)
RBC: 4.19 MIL/uL (ref 3.80–5.20)
RDW: 16.5 % — AB (ref 11.5–14.5)
WBC: 8.9 10*3/uL (ref 3.6–11.0)

## 2018-06-22 LAB — TROPONIN I
Troponin I: <0.03 ng/mL (ref <0.03)
Troponin I: <0.03 ng/mL (ref <0.03)

## 2018-06-22 MED ORDER — NITROGLYCERIN 0.4 MG SL SUBL
0.4000 mg | SUBLINGUAL_TABLET | Freq: Once | SUBLINGUAL | Status: AC
  Administered 2018-06-22: 0.4 mg via SUBLINGUAL
  Filled 2018-06-22: qty 1

## 2018-06-22 MED ORDER — OXYCODONE-ACETAMINOPHEN 5-325 MG PO TABS
1.0000 | ORAL_TABLET | Freq: Once | ORAL | Status: AC
Start: 2018-06-22 — End: 2018-06-22
  Administered 2018-06-22: 1 via ORAL
  Filled 2018-06-22: qty 1

## 2018-06-22 MED ORDER — LORAZEPAM 1 MG PO TABS
1.0000 mg | ORAL_TABLET | Freq: Once | ORAL | Status: AC
  Administered 2018-06-22: 1 mg via ORAL
  Filled 2018-06-22: qty 1

## 2018-06-22 MED ORDER — ASPIRIN 81 MG PO CHEW
324.0000 mg | CHEWABLE_TABLET | Freq: Once | ORAL | Status: AC
  Administered 2018-06-22: 324 mg via ORAL
  Filled 2018-06-22: qty 4

## 2018-06-22 NOTE — ED Notes (Signed)
Lab notified that label printer not working in Rm 36 and blood sample was sent using a white chart label with this RNs initials and time of collection.

## 2018-06-22 NOTE — ED Provider Notes (Signed)
[facility] Emergency Department Provider Note  Time seen: 4:54 PM  I have reviewed the triage vital signs and the nursing notes.   HISTORY  Chief Complaint Chest Pain and Hypertension    HPI Traci Duran is a 62 y.o. female with a past medical history of anxiety, COPD, gastric reflux, gastritis, hypertension, hyperlipidemia, presents to the emergency department for chest pain.  According to the patient since around 8:00 this morning she has been experiencing intermittent chest pain in the center of her chest.  Denies shortness of breath does state mild nausea as well as diaphoresis at times.  Currently states mild chest pain, patient is very anxious appearing, also is complaining that her blood pressure is elevated despite taking her blood pressure medications at home this morning, currently 180/107.  Denies any cough or congestion.  Denies abdominal pain, vomiting or diarrhea.  Patient states she had chest pain approximately 4 months ago and was seen in the emergency department and ultimately discharged home.   Past Medical History:  Diagnosis Date  . Anemia   . Anxiety   . COPD (chronic obstructive pulmonary disease) ([place])   . Coronary artery disease   . Depression   . Gastritis   . GERD (gastroesophageal reflux disease)   . Hemorrhoids   . Hyperlipidemia   . Hypertension   . PAD (peripheral artery disease) [facility])     Patient Active Problem List   Diagnosis Date Noted  . Hyponatremia 10/11/2017  . Chest pain 07/28/2017  . Acute blood loss anemia 07/28/2017  . Demand ischemia ([place])   . Benign neoplasm of descending colon   . Polyp of sigmoid colon   . Age related osteoporosis 04/16/2017  . Iron deficiency anemia secondary to blood loss (chronic)   . GI bleed 04/06/2017  . Diastolic dysfunction 53/61/4431  . Opioid contract exists 06/27/2016  . Incidental lung nodule 01/18/2016  . Traumatic hemorrhage of cerebrum ([place]) 12/04/2015  .  Hypovitaminosis D 08/29/2015  . Thyroid lesion 08/29/2015  . Major depressive disorder, single episode, unspecified 08/27/2015  . 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 Gastroesophageal reflux disease 01/26/2015  . Osteopenia 01/26/2015  . Anemia, unspecified 12/27/2014  . History of fall 12/27/2014  . Greater tuberosity of humerus fracture 10/24/2013  . AVF (arteriovenous fistula) ([place]) 11/08/2011  . Anxiety 10/24/2011  . Cerebrovascular disease 10/24/2011  . Dyslipidemia 10/24/2011  . Hypertension 10/24/2011  . Tobacco abuse 10/24/2011  . Palpitations 10/24/2011  . Renal artery stenosis ([place]) 10/24/2011  . Unstable angina ([place]) 10/14/2011  . Hyperlipidemia   . Chronic obstructive pulmonary disease ([place])   . Depression   . Peripheral arterial disease ([place])   . Coronary artery disease, non-occlusive     Past Surgical History:  Procedure Laterality Date  . APPENDECTOMY    . COLONOSCOPY WITH PROPOFOL N/A 06/09/2017   Procedure: COLONOSCOPY WITH PROPOFOL;  Surgeon: Lucilla Lame, MD;  Location: [facility] ENDOSCOPY;  Service: Endoscopy;  Laterality: N/A;  . ESOPHAGOGASTRODUODENOSCOPY N/A 07/30/2017   Procedure: ESOPHAGOGASTRODUODENOSCOPY (EGD);  Surgeon: Lin Landsman, MD;  Location: [facility] ENDOSCOPY;  Service: Gastroenterology;  Laterality: N/A;  . ESOPHAGOGASTRODUODENOSCOPY (EGD) WITH PROPOFOL N/A 04/09/2017   Procedure: ESOPHAGOGASTRODUODENOSCOPY (EGD) WITH PROPOFOL;  Surgeon: Lucilla Lame, MD;  Location: [facility] ENDOSCOPY;  Service: Endoscopy;  Laterality: N/A;  . HIP FRACTURE SURGERY      Prior to Admission medications   Medication Sig Start Date End Date Taking? Authorizing Provider  albuterol (PROVENTIL HFA;VENTOLIN HFA) 108 (90 Base) MCG/ACT  inhaler Inhale 1-2 puffs into the lungs every 6 (six) hours as needed for wheezing or shortness of breath.    [provider]  albuterol (PROVENTIL HFA;VENTOLIN HFA) 108 (90 Base) MCG/ACT inhaler Inhale 2 puffs into the lungs every  6 (six) hours as needed for wheezing or shortness of breath. 04/02/18   Merlyn Lot, MD  ALPRAZolam Duanne Moron) 1 MG tablet Take 1 mg by mouth 3 (three) times daily as needed. For anxiety     [provider]  atorvastatin (LIPITOR) 80 MG tablet Take 80 mg by mouth daily.    [provider]  busPIRone (BUSPAR) 10 MG tablet Take 10 mg by mouth daily.  04/30/17   [provider]  carvedilol (COREG) 6.25 MG tablet Take 6.25 mg by mouth 2 (two) times daily. 05/28/17   [provider]  citalopram (CELEXA) 40 MG tablet Take 40 mg by mouth daily.    [provider]  clopidogrel (PLAVIX) 75 MG tablet Take 75 mg by mouth daily.    [provider]  DULoxetine (CYMBALTA) 30 MG capsule Take 30 mg by mouth daily. 05/28/17   [provider]  ferrous sulfate 325 (65 FE) MG tablet Take 1 tablet (325 mg total) by mouth 3 (three) times daily with meals. 07/30/17   Fritzi Mandes, MD  gabapentin (NEURONTIN) 300 MG capsule Take 1 capsule by mouth 3 (three) times daily.     [provider]  lisinopril (PRINIVIL,ZESTRIL) 20 MG tablet Take 20 mg by mouth daily.      [provider]  Multiple Vitamin (MULTIVITAMIN WITH MINERALS) TABS tablet Take 2 tablets by mouth daily.    [provider]  nitroGLYCERIN (NITROSTAT) 0.4 MG SL tablet Place under the tongue. 01/24/16   [provider]  pantoprazole (PROTONIX) 20 MG tablet Take 20 mg by mouth daily.    [provider]  potassium chloride (K-DUR) 10 MEQ tablet Take 1 tablet (10 mEq total) by mouth daily. 05/28/17   Earleen Newport, MD  traMADol (ULTRAM) 50 MG tablet Take 1 tablet (50 mg total) by mouth every 6 (six) hours as needed. 07/30/17   Fritzi Mandes, MD  traMADol (ULTRAM) 50 MG tablet Take 1 tablet (50 mg total) by mouth every 6 (six) hours as needed. Do not fill without albuterol and prednisone 04/02/18 04/02/19  Merlyn Lot, MD  Vitamin D, Ergocalciferol,  (DRISDOL) 50000 units CAPS capsule TAKE 1 CAPSULE  50,000 UNITS TOTAL  BY MOUTH ONCE A WEEK 05/01/17   [provider]    Allergies  Allergen Reactions  . Chantix [Varenicline] Hives, Shortness Of Breath and Palpitations  . Diphenhydramine Hcl Shortness Of Breath  . Pregabalin Other (See Comments)    Falls  . Benadryl [Diphenhydramine Hcl] Rash  . Niacin Rash  . Trazodone Palpitations    Family History  Problem Relation Age of Onset  . Heart failure Mother   . Heart attack Mother        mother died at 33 of an MI  . Heart attack Father        father died at 94 of an MI  . Heart attack Brother        Brother had an MI in his 9s    Social History Social History   Tobacco Use  . Smoking status: Current Some Day Smoker    Packs/day: 1.00    Years: 45.00    Pack years: 45.00    Types: Cigarettes  . Smokeless tobacco:  Never Used  . Tobacco comment: she feels that the 21 nicotine made her jittery and we suggested a lower level patch  Substance Use Topics  . Alcohol use: No  . Drug use: Yes    Types: Marijuana    Comment: monthly    Review of Systems Constitutional: Negative for fever. Eyes: Negative for visual complaints ENT: Negative for recent illness/congestion Cardiovascular: Intermittent moderate chest pain since this morning Respiratory: Negative for shortness of breath. Gastrointestinal: Negative for abdominal pain.  Intermittent nausea but no vomiting or diarrhea Genitourinary: Negative for urinary compaints Musculoskeletal: Negative for leg pain or swelling Skin: Negative for skin complaints  Neurological: Negative for headache All other ROS negative  ____________________________________________   PHYSICAL EXAM:  VITAL SIGNS: ED Triage Vitals  Enc Vitals Group     BP 06/22/18 1624 (!) 180/107     Pulse Rate 06/22/18 1624 76     Resp 06/22/18 1624 16     Temp 06/22/18 1624 98.1 F (36.7 C)     Temp Source 06/22/18 1624 Oral     SpO2  06/22/18 1624 97 %     Weight 06/22/18 1621 120 lb (54.4 kg)     Height 06/22/18 1621 5\' 7"  (1.702 m)     Head Circumference --      Peak Flow --      Pain Score 06/22/18 1621 7     Pain Loc --      Pain Edu? --      Excl. in [place]? --     Constitutional: Alert and oriented. Well appearing and in no distress. Eyes: Normal exam ENT   Head: Normocephalic and atraumatic   Mouth/Throat: Mucous membranes are moist. Cardiovascular: Normal rate, regular rhythm. No murmur Respiratory: Normal respiratory effort without tachypnea nor retractions. Breath sounds are clear  Gastrointestinal: Soft and nontender. No distention.  Musculoskeletal: Nontender with normal range of motion in all extremities. No lower extremity tenderness or edema. Neurologic:  Normal speech and language. No gross focal neurologic deficits Skin:  Skin is warm, dry and intact.  Psychiatric: Mood and affect are normal.  ____________________________________________    EKG  EKG reviewed and interpreted by myself shows normal sinus rhythm at 76 bpm with a narrow QRS, normal axis, normal intervals, nonspecific ST changes, without ST elevation.  ____________________________________________    RADIOLOGY  Chest x-ray shows chronic changes but no acute findings.  ____________________________________________   INITIAL IMPRESSION / ASSESSMENT AND PLAN / ED COURSE  Pertinent labs & imaging results that were available during my care of the patient were reviewed by me and considered in my medical decision making (see chart for details).  She presents emergency department for central chest pain intermittent since early this morning.  Differential would include ACS, angina, musculoskeletal pain, chest wall pain, pleurisy, pneumonia, pneumothorax, anxiety.  Patient is quite anxious during evaluation.  We will treat with Ativan in the emergency department.  We will also dose nitroglycerin and aspirin while awaiting lab  results.  Patient agreeable to plan of care.  Repeat troponin is negative.  We will discharge the patient home with cardiology follow-up.  Patient agreeable to plan of care.  ____________________________________________   FINAL CLINICAL IMPRESSION(S) / ED DIAGNOSES  Chest pain    Harvest Dark, MD 06/22/18 1941

## 2018-06-22 NOTE — ED Notes (Signed)
Pt ambulated to bathroom 

## 2018-06-22 NOTE — ED Triage Notes (Signed)
Pt reports chest pain that started this am and has been on and off - reports nausea - reports dizziness - denies shortness of breath - denies radiation of pain

## 2018-07-27 ENCOUNTER — Emergency Department
Admission: EM | Admit: 2018-07-27 | Discharge: 2018-07-28 | Disposition: A | Discharge status: Home / Self Care | Payer: Medicare Other | Attending: Emergency Medicine | Admitting: Emergency Medicine

## 2018-07-27 ENCOUNTER — Encounter: Payer: Self-pay | Admitting: Emergency Medicine

## 2018-07-27 ENCOUNTER — Emergency Department: Payer: Medicare Other

## 2018-07-27 DIAGNOSIS — L03114 Cellulitis of left upper limb: Secondary | ICD-10-CM | POA: Diagnosis not present

## 2018-07-27 DIAGNOSIS — I2510 Atherosclerotic heart disease of native coronary artery without angina pectoris: Secondary | ICD-10-CM | POA: Diagnosis not present

## 2018-07-27 DIAGNOSIS — I10 Essential (primary) hypertension: Secondary | ICD-10-CM | POA: Insufficient documentation

## 2018-07-27 DIAGNOSIS — J449 Chronic obstructive pulmonary disease, unspecified: Secondary | ICD-10-CM | POA: Insufficient documentation

## 2018-07-27 DIAGNOSIS — Z79899 Other long term (current) drug therapy: Secondary | ICD-10-CM | POA: Diagnosis not present

## 2018-07-27 DIAGNOSIS — M7989 Other specified soft tissue disorders: Secondary | ICD-10-CM | POA: Diagnosis not present

## 2018-07-27 DIAGNOSIS — Z7901 Long term (current) use of anticoagulants: Secondary | ICD-10-CM | POA: Insufficient documentation

## 2018-07-27 DIAGNOSIS — F17210 Nicotine dependence, cigarettes, uncomplicated: Secondary | ICD-10-CM | POA: Insufficient documentation

## 2018-07-27 DIAGNOSIS — L299 Pruritus, unspecified: Secondary | ICD-10-CM | POA: Diagnosis present

## 2018-07-27 LAB — CBC WITH DIFFERENTIAL/PLATELET
BASOS PCT: 1 %
Basophils Absolute: 0.1 10*3/uL (ref 0–0.1)
EOS PCT: 4 %
Eosinophils Absolute: 0.3 10*3/uL (ref 0–0.7)
HEMATOCRIT: 29 % — AB (ref 35.0–47.0)
Hemoglobin: 9.3 g/dL — ABNORMAL LOW (ref 12.0–16.0)
Lymphocytes Relative: 27 %
Lymphs Abs: 2.2 10*3/uL (ref 1.0–3.6)
MCH: 23.3 pg — ABNORMAL LOW (ref 26.0–34.0)
MCHC: 32 g/dL (ref 32.0–36.0)
MCV: 72.9 fL — ABNORMAL LOW (ref 80.0–100.0)
MONO ABS: 0.4 10*3/uL (ref 0.2–0.9)
MONOS PCT: 5 %
Neutro Abs: 5.2 10*3/uL (ref 1.4–6.5)
Neutrophils Relative %: 63 %
Platelets: 280 10*3/uL (ref 150–440)
RBC: 3.98 MIL/uL (ref 3.80–5.20)
RDW: 17.7 % — AB (ref 11.5–14.5)
WBC: 8.2 10*3/uL (ref 3.6–11.0)

## 2018-07-27 LAB — BASIC METABOLIC PANEL
Anion gap: 4 — ABNORMAL LOW (ref 5–15)
BUN: 9 mg/dL (ref 8–23)
CALCIUM: 8.7 mg/dL — AB (ref 8.9–10.3)
CO2: 27 mmol/L (ref 22–32)
CREATININE: 0.77 mg/dL (ref 0.44–1.00)
Chloride: 107 mmol/L (ref 98–111)
GFR calc Af Amer: >60 mL/min (ref >60)
GFR calc non Af Amer: >60 mL/min (ref >60)
Glucose, Bld: 95 mg/dL (ref 70–99)
Potassium: 3.4 mmol/L — ABNORMAL LOW (ref 3.5–5.1)
Sodium: 138 mmol/L (ref 135–145)

## 2018-07-27 MED ORDER — HYDROCODONE-ACETAMINOPHEN 5-325 MG PO TABS
1.0000 | ORAL_TABLET | Freq: Once | ORAL | Status: AC
  Administered 2018-07-27: 1 via ORAL
  Filled 2018-07-27: qty 1

## 2018-07-27 MED ORDER — FAMOTIDINE IN NACL 20-0.9 MG/50ML-% IV SOLN
20.0000 mg | Freq: Once | INTRAVENOUS | Status: AC
  Administered 2018-07-27: 20 mg via INTRAVENOUS
  Filled 2018-07-27: qty 50

## 2018-07-27 MED ORDER — AMOXICILLIN 500 MG PO CAPS: 500.0000 mg | ORAL_CAPSULE | Freq: Three times a day (TID) | ORAL | 0 refills | Status: DC

## 2018-07-27 MED ORDER — DEXAMETHASONE SODIUM PHOSPHATE 10 MG/ML IJ SOLN
10.0000 mg | Freq: Once | INTRAMUSCULAR | Status: DC
  Filled 2018-07-27: qty 1

## 2018-07-27 MED ORDER — HYDROXYZINE HCL 25 MG PO TABS
25.0000 mg | ORAL_TABLET | Freq: Once | ORAL | Status: AC
  Administered 2018-07-27: 25 mg via ORAL
  Filled 2018-07-27: qty 1

## 2018-07-27 MED ORDER — DEXAMETHASONE SODIUM PHOSPHATE 10 MG/ML IJ SOLN
10.0000 mg | Freq: Once | INTRAMUSCULAR | Status: AC
  Administered 2018-07-27: 10 mg via INTRAVENOUS

## 2018-07-27 MED ORDER — AMOXICILLIN 500 MG PO CAPS
500.0000 mg | ORAL_CAPSULE | Freq: Once | ORAL | Status: AC
  Administered 2018-07-27: 500 mg via ORAL
  Filled 2018-07-27: qty 1

## 2018-07-27 MED ORDER — HYDROCODONE-ACETAMINOPHEN 5-325 MG PO TABS: 1.0000 | ORAL_TABLET | Freq: Three times a day (TID) | ORAL | 0 refills | Status: AC | PRN

## 2018-07-27 NOTE — ED Notes (Signed)
Patient unable to be discharged at this time d/t patient not having ride. Patient was asked several times prior to getting medications if she had ride, which she confirmed she did have. Patient is to stay at hospital in room until cleared by provider to drive.

## 2018-07-27 NOTE — ED Provider Notes (Signed)
[facility] Emergency Department Provider Note ____________________________________________  Time seen: 2018  I have reviewed the triage vital signs and the nursing notes.  HISTORY  Chief Complaint  Allergic Reaction  HPI Traci Duran is a 62 y.o. female presents herself to the ED for evaluation of a possible allergic reaction.  Patient describes itching to the upper extremities and shoulders as well as to the legs.  Also noted and raised area to the left lateral forearm this morning.  She cannot take Benadryl due to establish allergy.  She is also on blood thinners but denies any known injury to the areas.  She denies any fevers, chills, sweats, chest pain, or shortness of breath.  Patient does report she was treated by her primary provider about 2 weeks earlier for a dental infection.  She is placed on clindamycin for the infection, but stopped about 2 days short of her complete 10-day course, because she was concerned that the antibiotic was causing some allergic reaction.  She denies any previous history of cellulitis or MRSA.  She does note several areas where she had scratched the skin to the point of causing local small ulcers.  She denies any other contacts, exposures, infestations, or travel.  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  Past Medical History:  Diagnosis Date  . Anemia   . Anxiety   . COPD (chronic obstructive pulmonary disease) ([place])   . Coronary artery disease   . Depression   . Gastritis   . GERD (gastroesophageal reflux disease)   . Hemorrhoids   . Hyperlipidemia   . Hypertension   . PAD (peripheral artery disease) [facility])     Patient Active Problem List   Diagnosis Date Noted  . Hyponatremia 10/11/2017  . Chest pain 07/28/2017  . Acute blood loss anemia 07/28/2017  . Demand ischemia ([place])   . Benign neoplasm of descending colon   . Polyp of  sigmoid colon   . Age related osteoporosis 04/16/2017  . Iron deficiency anemia secondary to blood loss (chronic)   . GI bleed 04/06/2017  . Diastolic dysfunction 35/57/3220  . Opioid contract exists 06/27/2016  . Incidental lung nodule 01/18/2016  . 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 Hypovitaminosis D 08/29/2015  . Thyroid lesion 08/29/2015  . Major depressive disorder, single episode, unspecified 08/27/2015  . 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 Gastroesophageal reflux disease 01/26/2015  . Osteopenia 01/26/2015  . Anemia, unspecified 12/27/2014  . History of fall 12/27/2014  . Greater tuberosity of humerus fracture 10/24/2013  . AVF (arteriovenous fistula) ([place]) 11/08/2011  . Anxiety 10/24/2011  . Cerebrovascular disease 10/24/2011  . Dyslipidemia 10/24/2011  . Hypertension 10/24/2011  . Tobacco abuse 10/24/2011  . Palpitations 10/24/2011  . Renal artery stenosis ([place]) 10/24/2011  . Unstable angina ([place]) 10/14/2011  . Hyperlipidemia   . Chronic obstructive pulmonary disease ([place])   . Depression   . Peripheral arterial disease ([place])   . Coronary artery disease, non-occlusive     Past Surgical History:  Procedure Laterality Date  . APPENDECTOMY    . COLONOSCOPY WITH PROPOFOL N/A 06/09/2017   Procedure: COLONOSCOPY WITH PROPOFOL;  Surgeon: Lucilla Lame, MD;  Location: [facility] ENDOSCOPY;  Service: Endoscopy;  Laterality: N/A;  . ESOPHAGOGASTRODUODENOSCOPY N/A 07/30/2017   Procedure: ESOPHAGOGASTRODUODENOSCOPY (EGD);  Surgeon: Lin Landsman, MD;  Location: [facility] ENDOSCOPY;  Service: Gastroenterology;  Laterality: N/A;  . ESOPHAGOGASTRODUODENOSCOPY (  EGD) WITH PROPOFOL N/A 04/09/2017   Procedure: ESOPHAGOGASTRODUODENOSCOPY (EGD) WITH PROPOFOL;  Surgeon: Lucilla Lame, MD;  Location: [facility] ENDOSCOPY;  Service: Endoscopy;  Laterality: N/A;  . HIP FRACTURE SURGERY      Prior to Admission medications   Medication Sig Start Date End Date Taking? Authorizing  Provider  albuterol (PROVENTIL HFA;VENTOLIN HFA) 108 (90 Base) MCG/ACT inhaler Inhale 1-2 puffs into the lungs every 6 (six) hours as needed for wheezing or shortness of breath.    [provider]  albuterol (PROVENTIL HFA;VENTOLIN HFA) 108 (90 Base) MCG/ACT inhaler Inhale 2 puffs into the lungs every 6 (six) hours as needed for wheezing or shortness of breath. 04/02/18   Merlyn Lot, MD  ALPRAZolam Duanne Moron) 1 MG tablet Take 1 mg by mouth 3 (three) times daily as needed. For anxiety     [provider]  amoxicillin (AMOXIL) 500 MG capsule Take 1 capsule (500 mg total) by mouth 3 (three) times daily. 07/27/18   Natosha Bou, Dannielle Karvonen, PA-C  atorvastatin (LIPITOR) 80 MG tablet Take 80 mg by mouth daily.    [provider]  busPIRone (BUSPAR) 10 MG tablet Take 10 mg by mouth daily.  04/30/17   [provider]  carvedilol (COREG) 6.25 MG tablet Take 6.25 mg by mouth 2 (two) times daily. 05/28/17   [provider]  citalopram (CELEXA) 40 MG tablet Take 40 mg by mouth daily.    [provider]  clopidogrel (PLAVIX) 75 MG tablet Take 75 mg by mouth daily.    [provider]  DULoxetine (CYMBALTA) 30 MG capsule Take 30 mg by mouth daily. 05/28/17   [provider]  ferrous sulfate 325 (65 FE) MG tablet Take 1 tablet (325 mg total) by mouth 3 (three) times daily with meals. 07/30/17   Fritzi Mandes, MD  gabapentin (NEURONTIN) 300 MG capsule Take 1 capsule by mouth 3 (three) times daily.     [provider]  HYDROcodone-acetaminophen (NORCO) 5-325 MG tablet Take 1 tablet by mouth 3 (three) times daily as needed for up to 3 days. 07/27/18 07/30/18  Aisia Correira, Dannielle Karvonen, PA-C  lisinopril (PRINIVIL,ZESTRIL) 20 MG tablet Take 20 mg by mouth daily.      [provider]  Multiple Vitamin (MULTIVITAMIN WITH MINERALS) TABS tablet Take 2 tablets by mouth daily.    [provider]  nitroGLYCERIN (NITROSTAT) 0.4 MG SL tablet  Place under the tongue. 01/24/16   [provider]  pantoprazole (PROTONIX) 20 MG tablet Take 20 mg by mouth daily.    [provider]  potassium chloride (K-DUR) 10 MEQ tablet Take 1 tablet (10 mEq total) by mouth daily. 05/28/17   Earleen Newport, MD  traMADol (ULTRAM) 50 MG tablet Take 1 tablet (50 mg total) by mouth every 6 (six) hours as needed. 07/30/17   Fritzi Mandes, MD  traMADol (ULTRAM) 50 MG tablet Take 1 tablet (50 mg total) by mouth every 6 (six) hours as needed. Do not fill without albuterol and prednisone 04/02/18 04/02/19  Merlyn Lot, MD  Vitamin D, Ergocalciferol, (DRISDOL) 50000 units CAPS capsule TAKE 1 CAPSULE  50,000 UNITS TOTAL  BY MOUTH ONCE A WEEK 05/01/17   [provider]    Allergies Chantix [varenicline]; Diphenhydramine hcl; Pregabalin; Benadryl [diphenhydramine hcl]; Niacin; and Trazodone  Family History  Problem Relation Age of Onset  . Heart failure Mother   . Heart attack Mother        mother died at 44 of an MI  .  Heart attack Father        father died at 52 of an MI  . Heart attack Brother        Brother had an MI in his 88s    Social History Social History   Tobacco Use  . Smoking status: Current Some Day Smoker    Packs/day: 1.00    Years: 45.00    Pack years: 45.00    Types: Cigarettes  . Smokeless tobacco: Never Used  . Tobacco comment: she feels that the 21 nicotine made her jittery and we suggested a lower level patch  Substance Use Topics  . Alcohol use: No  . Drug use: Yes    Types: Marijuana    Comment: monthly    Review of Systems  Constitutional: Negative for fever. Eyes: Negative for visual changes. ENT: Negative for sore throat. Cardiovascular: Negative for chest pain. Respiratory: Negative for shortness of breath. Gastrointestinal: Negative for abdominal pain, vomiting and diarrhea. Genitourinary: Negative for dysuria. Musculoskeletal: Negative for back pain. Skin: Positive for rash.   Left forearm swelling as above. Neurological: Negative for headaches, focal weakness or numbness. ____________________________________________  PHYSICAL EXAM:  VITAL SIGNS: ED Triage Vitals  Enc Vitals Group     BP 07/27/18 1923 (!) 158/85     Pulse Rate 07/27/18 1923 86     Resp 07/27/18 1923 17     Temp 07/27/18 1923 98.3 F (36.8 C)     Temp Source 07/27/18 1923 Oral     SpO2 07/27/18 1923 100 %     Weight 07/27/18 1919 119 lb 14.9 oz (54.4 kg)     Height --      Head Circumference --      Peak Flow --      Pain Score 07/27/18 2157 6     Pain Loc --      Pain Edu? --      Excl. in [place]? --     Constitutional: Alert and oriented. Well appearing and in no distress. Head: Normocephalic and atraumatic. Eyes: Conjunctivae are normal. PERRL. Normal extraocular movements Neck: Supple.  Hematological/Lymphatic/Immunological: No cervical lymphadenopathy. Cardiovascular: Normal rate, regular rhythm. Normal distal pulses. Respiratory: Normal respiratory effort. No wheezes/rales/rhonchi. Musculoskeletal: Nontender with normal range of motion in all extremities.  Neurologic:  Normal gait without ataxia. Normal speech and language. No gross focal neurologic deficits are appreciated. Skin:  Skin is warm, dry and intact.  Patient with several excoriated papules noted across the upper shoulder, forearms, and legs, consistent with likely insect bites.  Patient however does have a distinct area to the left forearm that is edematous and fluctuant.  There is an overlying area to the volar forearm that appears erythematous without blanching that may represent ecchymosis.  There also is a scar related but scabbed area to the dorsal wrist consistent with a deep scratch from the patient's dog.  This seems to be some overlying erythema and streaking from the area of the lateral forearm to a similar area to the medial elbow. ____________________________________________   LABS (pertinent  positives/negatives)  Labs Reviewed  BASIC METABOLIC PANEL - Abnormal; Notable for the following components:      Result Value   Potassium 3.4 (*)    Calcium 8.7 (*)    Anion gap 4 (*)    All other components within normal limits  CBC WITH DIFFERENTIAL/PLATELET - Abnormal; Notable for the following components:   Hemoglobin 9.3 (*)    HCT 29.0 (*)    MCV 72.9 (*)  MCH 23.3 (*)    RDW 17.7 (*)    All other components within normal limits  ____________________________________________   RADIOLOGY  LUE Ultrasound   IMPRESSION: Tracking subcutaneous fluid deep to the area of erythema and swelling involving the lateral left forearm compatible with cellulitis. ____________________________________________  PROCEDURES  Procedures Amoxicillin 500 mg PO Norco 5-325 mg PO Vistaril 25 mg PO Decadron 10 mg IVP Famotidine 20 mg IVPB ____________________________________________  INITIAL IMPRESSION / ASSESSMENT AND PLAN / ED COURSE  Patient with ED evaluation of itching to several papules over the upper and lower extremities as well as a area of soft tissue swelling to the lateral left forearm.  Patient's clinical picture does not seem to represent a urticaria or hives.  The area to the forearms concerning for possible subcutaneous hematoma.  Ultrasound imaging reveals some edema and collection concerning for cellulitis. This is likely due to again superficial skin abrasion noted to the dorsal left forearm. Her labs are normal at this time. Patient is treated empirically for cellulitis with amoxicillin.  Patient had advised that she would call her family member to pick her up prior to the administration of sedating medications including hydrocodone and Vistaril.  At the time of disposition the patient notified me and the nurse that she was unable to establish contact with the family member was unable to locate anyone else to pick her up.  We advised the patient that we would no be willing to  let her leave on her own accord.  She declined and offer her for a taxi home, instead is willing to be observed in the ED for appropriate time to allow her to drive herself home due to medication administration.  Patient will be treated for cellulitis as noted above and will follow with primary provider return to the ED for signs of worsening symptoms.  She verbalized understanding of the treatment plan and return precautions. ____________________________________________  FINAL CLINICAL IMPRESSION(S) / ED DIAGNOSES  Final diagnoses:  Cellulitis of left upper extremity      Carmie End, Dannielle Karvonen, PA-C 07/27/18 2359    Schuyler Amor, MD 07/31/18 [id number]

## 2018-07-27 NOTE — ED Triage Notes (Signed)
Pt with raised red area to the left arm that she noticed this AM as well as itching. Pt unable to take benadryl due to allergy. Pt is on blood thinner but denies injury to area. No streaking noted to area.

## 2018-07-27 NOTE — Discharge Instructions (Signed)
Take the antibiotic as directed and the pain medicine as needed. Follow-up with [facility] for recheck in 3-days. Return to the Emergency Department for signs of worsening infection, spreading of redness, fevers, or drainage.

## 2018-07-28 NOTE — ED Notes (Signed)
Pt is going to stay in the ED until the effect of the narcotics that she receive are out of her sistem because she wants to drive herself back home.

## 2018-07-30 IMAGING — CT CT CHEST LUNG CANCER SCREENING LOW DOSE W/O CM
1 series · 15 of 31 positions shown, 19 images · non-contrast
Comparison: 09/22/2017 chest radiograph. 05/01/2007 chest CT
angiogram.

CLINICAL DATA: 60-year-old asymptomatic female current smoker with
45 pack-year smoking history.

EXAM:
CT CHEST WITHOUT CONTRAST LOW-DOSE FOR LUNG CANCER SCREENING
TECHNIQUE: Multidetector CT imaging of the chest was performed following the
standard protocol without IV contrast.

[Series 2: axial st · axial · 0.61mm/px · z∈[-746,-456]mm · 15 of 64 slices shown, 19 images]
[im 3/64  mediastinal]
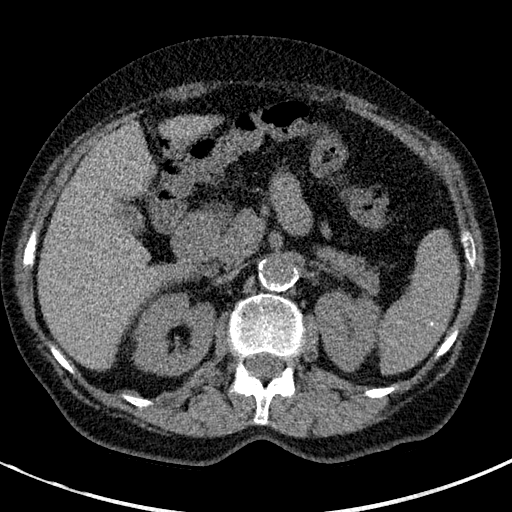
[im 3/64  lung]
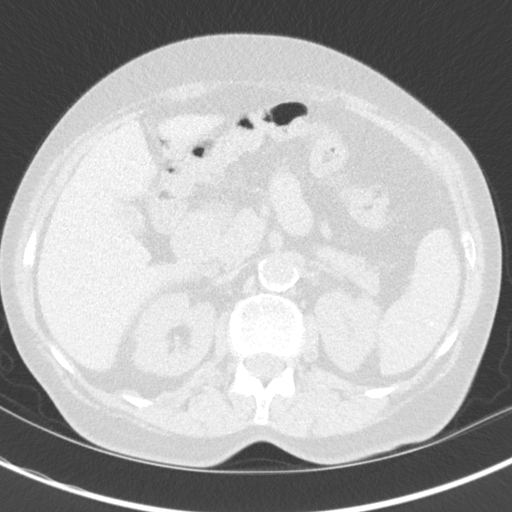
[im 8/64  lung]
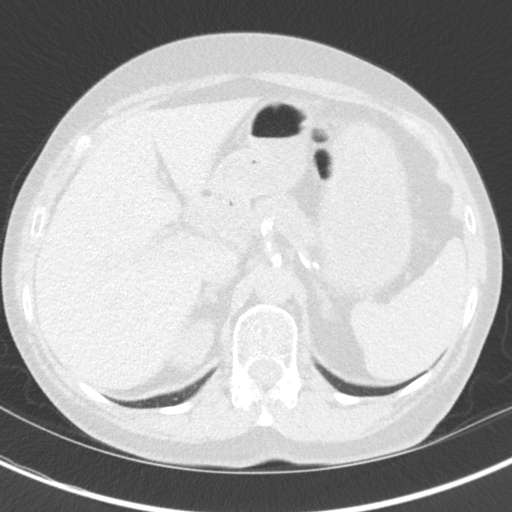
[im 12/64  lung]
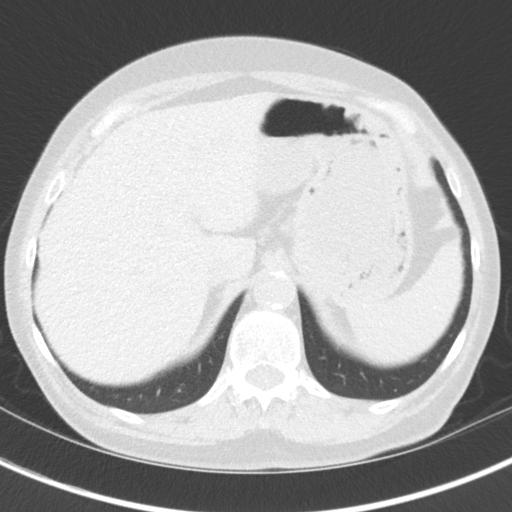
[im 15/64  lung]
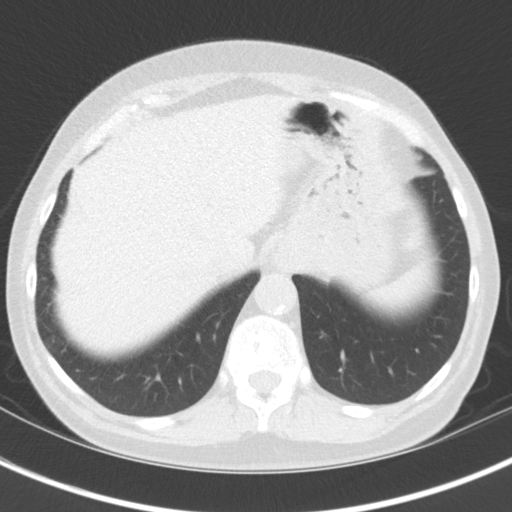
[im 19/64  mediastinal]
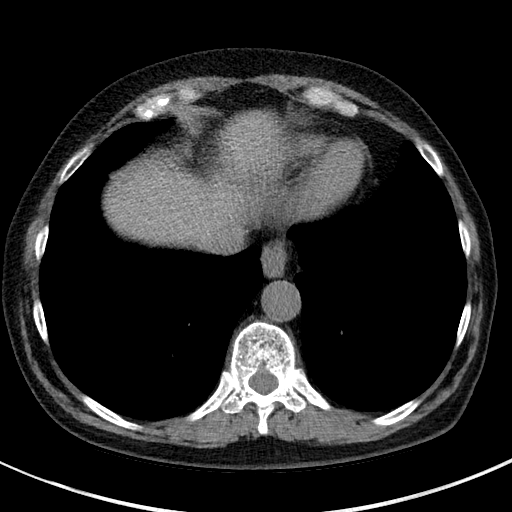
[im 19/64  lung]
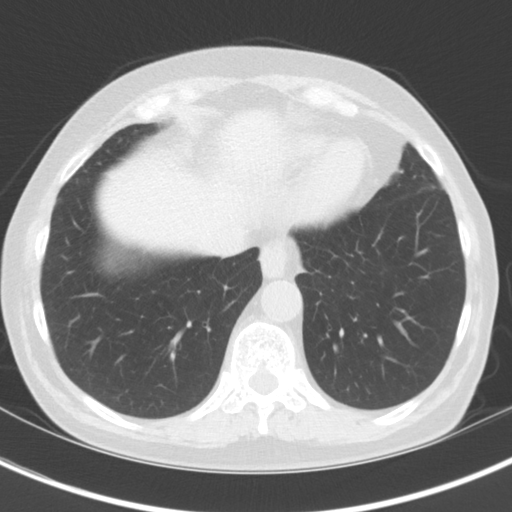
[im 24/64  lung]
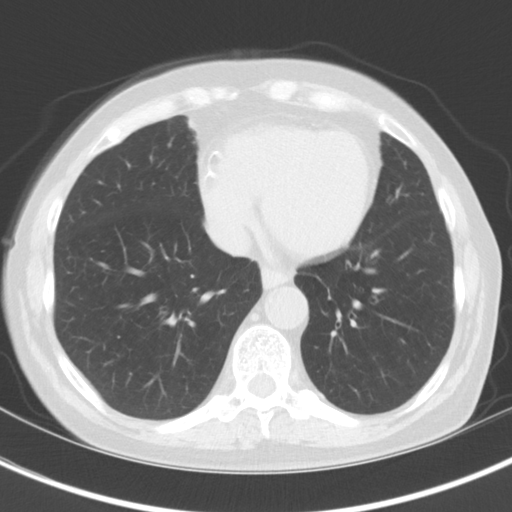
[im 29/64  lung]
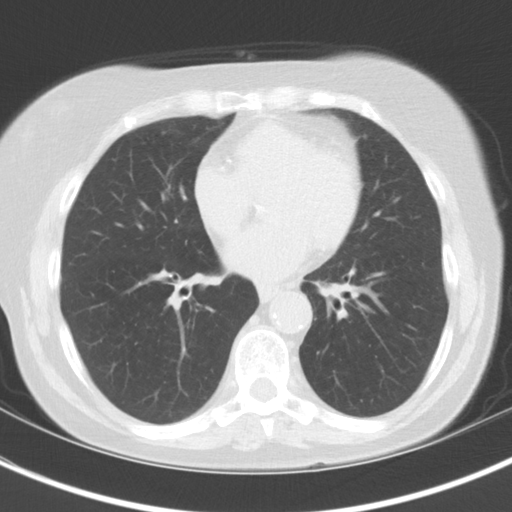
[im 33/64  lung]
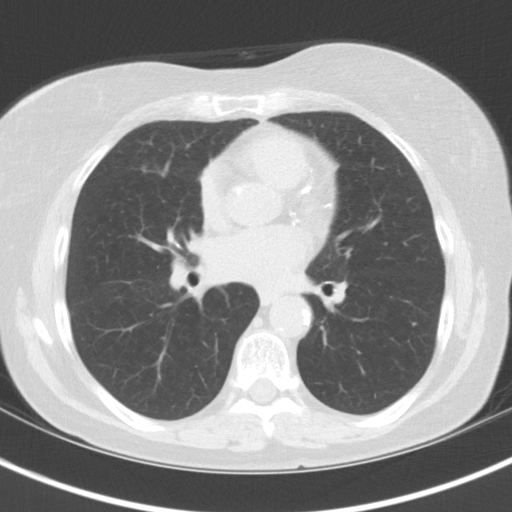
[im 36/64  mediastinal]
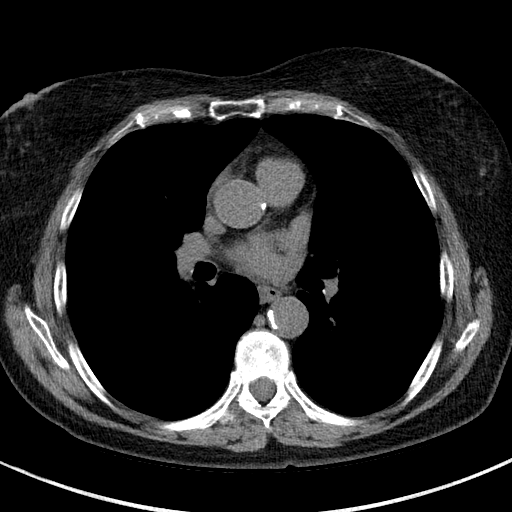
[im 36/64  lung]
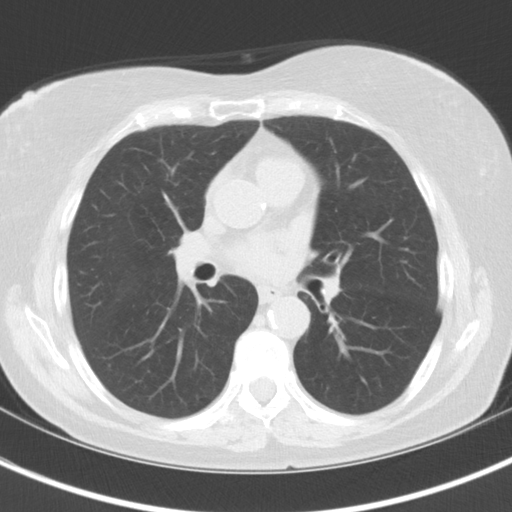
[im 40/64  lung]
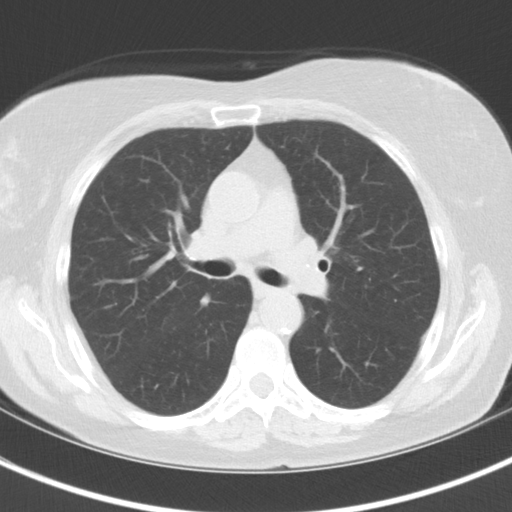
[im 45/64  lung]
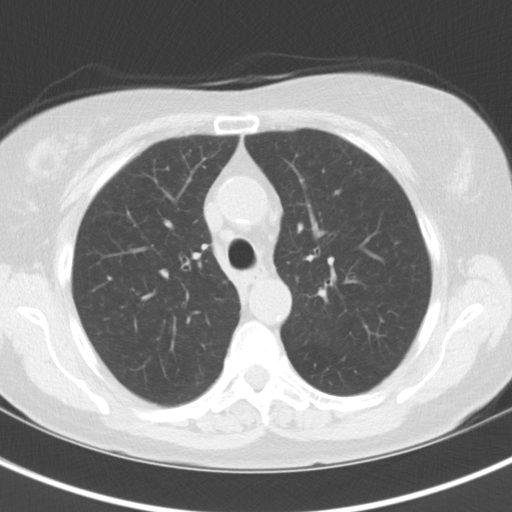
[im 50/64  lung]
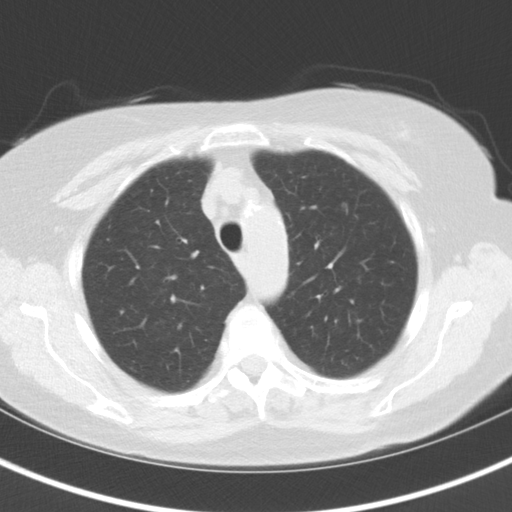
[im 52/64  mediastinal]
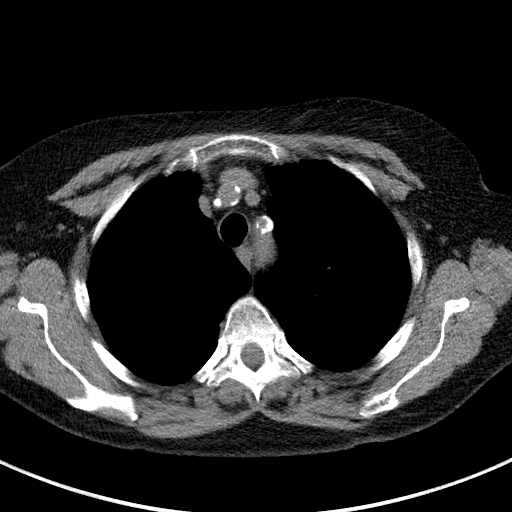
[im 52/64  lung]
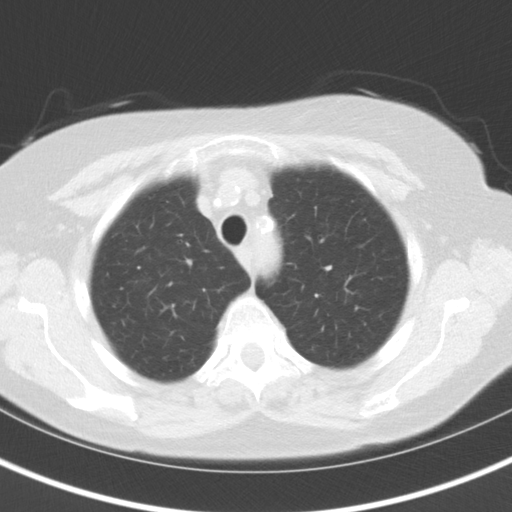
[im 57/64  lung]
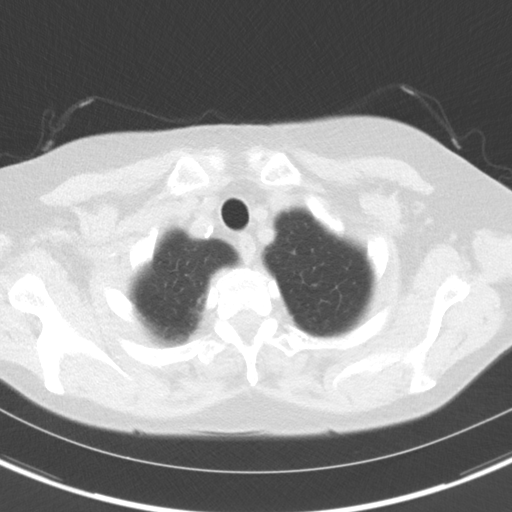
[im 61/64  lung]
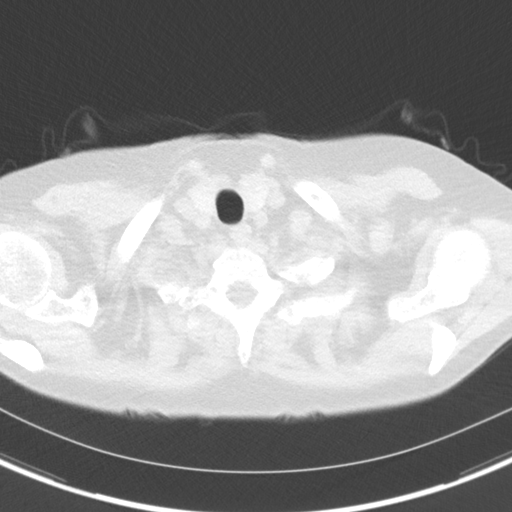

[15 of 31 positions shown; findings below may reference images not displayed]

FINDINGS: Cardiovascular: Normal heart size. No significant pericardial
fluid/thickening. Left anterior descending, left circumflex and
right coronary atherosclerosis. Atherosclerotic nonaneurysmal
thoracic aorta. Normal caliber pulmonary arteries.

Mediastinum/Nodes: No discrete thyroid nodules. Unremarkable
esophagus. No pathologically enlarged axillary, mediastinal or gross
hilar lymph nodes, noting limited sensitivity for the detection of
hilar adenopathy on this noncontrast study.

Lungs/Pleura: No pneumothorax. No pleural effusion. Mild
centrilobular emphysema. No acute consolidative airspace disease or
lung masses. A few scattered tiny solid pulmonary nodules in both
lungs, largest measuring 2.3 mm in volume derived mean diameter in
the peripheral right upper lobe (series 3/ image 124).

Upper abdomen: Small hiatal hernia. Punctate granulomatous
calcifications throughout the spleen, unchanged.

Musculoskeletal: No aggressive appearing focal osseous lesions.
Severe T4 and mild T9 vertebral compression fractures are not
appreciably changed since 03/09/2016 lateral chest radiograph. Mild
thoracic spondylosis.
IMPRESSION: 1. Lung-RADS 2-S, benign appearance or behavior. Continue annual
screening with low-dose chest CT without contrast in 12 months.
2. The "S" modifier above refers to potentially clinically
significant non lung cancer related findings. Specifically,
three-vessel coronary atherosclerosis.
3. Small hiatal hernia.

Aortic Atherosclerosis (4GEJJ-ZDT.T) and Emphysema (4GEJJ-2A2.Y).

## 2018-08-08 HISTORY — PX: NM GATED MYOVIEW (ARMC HX): HXRAD1856

## 2018-09-03 ENCOUNTER — Emergency Department: Payer: Medicare Other

## 2018-09-03 ENCOUNTER — Encounter: Payer: Self-pay | Admitting: Emergency Medicine

## 2018-09-03 ENCOUNTER — Observation Stay
Admission: EM | Admit: 2018-09-03 | Discharge: 2018-09-05 | Disposition: A | Discharge status: Home / Self Care | Payer: Medicare Other | Attending: Internal Medicine | Admitting: Internal Medicine

## 2018-09-03 DIAGNOSIS — G8929 Other chronic pain: Secondary | ICD-10-CM | POA: Diagnosis not present

## 2018-09-03 DIAGNOSIS — Z888 Allergy status to other drugs, medicaments and biological substances status: Secondary | ICD-10-CM | POA: Insufficient documentation

## 2018-09-03 DIAGNOSIS — I10 Essential (primary) hypertension: Secondary | ICD-10-CM | POA: Insufficient documentation

## 2018-09-03 DIAGNOSIS — K219 Gastro-esophageal reflux disease without esophagitis: Secondary | ICD-10-CM | POA: Insufficient documentation

## 2018-09-03 DIAGNOSIS — I739 Peripheral vascular disease, unspecified: Secondary | ICD-10-CM | POA: Insufficient documentation

## 2018-09-03 DIAGNOSIS — Z8673 Personal history of transient ischemic attack (TIA), and cerebral infarction without residual deficits: Secondary | ICD-10-CM | POA: Diagnosis not present

## 2018-09-03 DIAGNOSIS — I6529 Occlusion and stenosis of unspecified carotid artery: Secondary | ICD-10-CM | POA: Insufficient documentation

## 2018-09-03 DIAGNOSIS — Z7902 Long term (current) use of antithrombotics/antiplatelets: Secondary | ICD-10-CM | POA: Diagnosis not present

## 2018-09-03 DIAGNOSIS — D649 Anemia, unspecified: Secondary | ICD-10-CM

## 2018-09-03 DIAGNOSIS — I25110 Atherosclerotic heart disease of native coronary artery with unstable angina pectoris: Secondary | ICD-10-CM | POA: Insufficient documentation

## 2018-09-03 DIAGNOSIS — E785 Hyperlipidemia, unspecified: Secondary | ICD-10-CM | POA: Diagnosis not present

## 2018-09-03 DIAGNOSIS — M549 Dorsalgia, unspecified: Secondary | ICD-10-CM | POA: Insufficient documentation

## 2018-09-03 DIAGNOSIS — J449 Chronic obstructive pulmonary disease, unspecified: Secondary | ICD-10-CM | POA: Insufficient documentation

## 2018-09-03 DIAGNOSIS — F331 Major depressive disorder, recurrent, moderate: Principal | ICD-10-CM

## 2018-09-03 DIAGNOSIS — E871 Hypo-osmolality and hyponatremia: Secondary | ICD-10-CM | POA: Insufficient documentation

## 2018-09-03 DIAGNOSIS — Z79899 Other long term (current) drug therapy: Secondary | ICD-10-CM | POA: Diagnosis not present

## 2018-09-03 DIAGNOSIS — F419 Anxiety disorder, unspecified: Secondary | ICD-10-CM | POA: Insufficient documentation

## 2018-09-03 DIAGNOSIS — F22 Delusional disorders: Secondary | ICD-10-CM | POA: Diagnosis not present

## 2018-09-03 DIAGNOSIS — F332 Major depressive disorder, recurrent severe without psychotic features: Secondary | ICD-10-CM | POA: Insufficient documentation

## 2018-09-03 DIAGNOSIS — F17210 Nicotine dependence, cigarettes, uncomplicated: Secondary | ICD-10-CM | POA: Diagnosis not present

## 2018-09-03 DIAGNOSIS — R079 Chest pain, unspecified: Secondary | ICD-10-CM | POA: Diagnosis present

## 2018-09-03 DIAGNOSIS — F1310 Sedative, hypnotic or anxiolytic abuse, uncomplicated: Secondary | ICD-10-CM

## 2018-09-03 DIAGNOSIS — R441 Visual hallucinations: Secondary | ICD-10-CM

## 2018-09-03 LAB — URINALYSIS, COMPLETE (UACMP) WITH MICROSCOPIC
Bacteria, UA: NONE SEEN
Bilirubin Urine: NEGATIVE
GLUCOSE, UA: NEGATIVE mg/dL
Hgb urine dipstick: NEGATIVE
Ketones, ur: NEGATIVE mg/dL
Leukocytes, UA: NEGATIVE
Nitrite: NEGATIVE
Protein, ur: NEGATIVE mg/dL
SQUAMOUS EPITHELIAL / LPF: NONE SEEN (ref 0–5)
Specific Gravity, Urine: 1.001 — ABNORMAL LOW (ref 1.005–1.030)
WBC, UA: NONE SEEN WBC/hpf (ref 0–5)
pH: 6 (ref 5.0–8.0)

## 2018-09-03 LAB — TROPONIN I
Troponin I: <0.03 ng/mL (ref <0.03)
Troponin I: <0.03 ng/mL (ref <0.03)

## 2018-09-03 LAB — BASIC METABOLIC PANEL
ANION GAP: 9 (ref 5–15)
BUN: 15 mg/dL (ref 8–23)
CO2: 23 mmol/L (ref 22–32)
Calcium: 8.9 mg/dL (ref 8.9–10.3)
Chloride: 98 mmol/L (ref 98–111)
Creatinine, Ser: 1.07 mg/dL — ABNORMAL HIGH (ref 0.44–1.00)
GFR, EST NON AFRICAN AMERICAN: 55 mL/min — AB (ref >60)
GLUCOSE: 85 mg/dL (ref 70–99)
Potassium: 4.1 mmol/L (ref 3.5–5.1)
Sodium: 130 mmol/L — ABNORMAL LOW (ref 135–145)

## 2018-09-03 LAB — CBC
HCT: 24.8 % — ABNORMAL LOW (ref 35.0–47.0)
HEMOGLOBIN: 7.5 g/dL — AB (ref 12.0–16.0)
MCH: 20.2 pg — ABNORMAL LOW (ref 26.0–34.0)
MCHC: 30.3 g/dL — AB (ref 32.0–36.0)
MCV: 66.6 fL — ABNORMAL LOW (ref 80.0–100.0)
Platelets: 319 10*3/uL (ref 150–440)
RBC: 3.73 MIL/uL — AB (ref 3.80–5.20)
RDW: 19 % — ABNORMAL HIGH (ref 11.5–14.5)
WBC: 11.7 10*3/uL — ABNORMAL HIGH (ref 3.6–11.0)

## 2018-09-03 LAB — TSH: TSH: 1.669 u[IU]/mL (ref 0.350–4.500)

## 2018-09-03 MED ORDER — CLOPIDOGREL BISULFATE 75 MG PO TABS
ORAL_TABLET | ORAL | Status: AC
  Administered 2018-09-03: 75 mg via ORAL
  Filled 2018-09-03: qty 1

## 2018-09-03 MED ORDER — SODIUM CHLORIDE 0.9 % IV BOLUS
500.0000 mL | Freq: Once | INTRAVENOUS | Status: AC
  Administered 2018-09-03: 500 mL via INTRAVENOUS

## 2018-09-03 MED ORDER — FERROUS SULFATE 325 (65 FE) MG PO TABS
325.0000 mg | ORAL_TABLET | Freq: Three times a day (TID) | ORAL | Status: DC
  Administered 2018-09-04 – 2018-09-05 (×3): 325 mg via ORAL
  Filled 2018-09-03 (×3): qty 1

## 2018-09-03 MED ORDER — DOCUSATE SODIUM 100 MG PO CAPS
100.0000 mg | ORAL_CAPSULE | Freq: Two times a day (BID) | ORAL | Status: DC
  Administered 2018-09-04: 100 mg via ORAL
  Filled 2018-09-03 (×4): qty 1

## 2018-09-03 MED ORDER — ATORVASTATIN CALCIUM 20 MG PO TABS
ORAL_TABLET | ORAL | Status: AC
  Administered 2018-09-03: 80 mg via ORAL
  Filled 2018-09-03: qty 4

## 2018-09-03 MED ORDER — CITALOPRAM HYDROBROMIDE 20 MG PO TABS
ORAL_TABLET | ORAL | Status: AC
  Administered 2018-09-03: 40 mg via ORAL
  Filled 2018-09-03: qty 2

## 2018-09-03 MED ORDER — BISACODYL 5 MG PO TBEC: 5.0000 mg | DELAYED_RELEASE_TABLET | Freq: Every day | ORAL | Status: DC | PRN

## 2018-09-03 MED ORDER — CLOPIDOGREL BISULFATE 75 MG PO TABS
75.0000 mg | ORAL_TABLET | Freq: Every day | ORAL | Status: DC
  Administered 2018-09-03 – 2018-09-05 (×3): 75 mg via ORAL
  Filled 2018-09-03 (×2): qty 1

## 2018-09-03 MED ORDER — LISINOPRIL 20 MG PO TABS
20.0000 mg | ORAL_TABLET | Freq: Every day | ORAL | Status: DC
  Administered 2018-09-03 – 2018-09-05 (×3): 20 mg via ORAL
  Filled 2018-09-03 (×2): qty 1

## 2018-09-03 MED ORDER — ONDANSETRON HCL 4 MG/2ML IJ SOLN: 4.0000 mg | Freq: Four times a day (QID) | INTRAMUSCULAR | Status: DC | PRN

## 2018-09-03 MED ORDER — NITROGLYCERIN 2 % TD OINT
0.5000 [in_us] | TOPICAL_OINTMENT | Freq: Four times a day (QID) | TRANSDERMAL | Status: DC
  Administered 2018-09-04 (×2): 0.5 [in_us] via TOPICAL
  Filled 2018-09-03 (×2): qty 1

## 2018-09-03 MED ORDER — RISPERIDONE 0.5 MG PO TABS
0.5000 mg | ORAL_TABLET | Freq: Every day | ORAL | Status: DC
  Administered 2018-09-04 (×2): 0.5 mg via ORAL
  Filled 2018-09-03 (×3): qty 1

## 2018-09-03 MED ORDER — BUSPIRONE HCL 10 MG PO TABS
10.0000 mg | ORAL_TABLET | Freq: Every day | ORAL | Status: DC
  Administered 2018-09-03 – 2018-09-05 (×3): 10 mg via ORAL
  Filled 2018-09-03 (×2): qty 1

## 2018-09-03 MED ORDER — ALBUTEROL SULFATE (2.5 MG/3ML) 0.083% IN NEBU: 2.5000 mg | INHALATION_SOLUTION | Freq: Four times a day (QID) | RESPIRATORY_TRACT | Status: DC | PRN

## 2018-09-03 MED ORDER — HYDROCODONE-ACETAMINOPHEN 5-325 MG PO TABS
1.0000 | ORAL_TABLET | ORAL | Status: DC | PRN
  Administered 2018-09-04 (×2): 2 via ORAL
  Administered 2018-09-05: 1 via ORAL
  Filled 2018-09-03 (×2): qty 2
  Filled 2018-09-03: qty 1

## 2018-09-03 MED ORDER — ACETAMINOPHEN 500 MG PO TABS
1000.0000 mg | ORAL_TABLET | ORAL | Status: AC
  Administered 2018-09-03: 1000 mg via ORAL
  Filled 2018-09-03: qty 2

## 2018-09-03 MED ORDER — ALPRAZOLAM 1 MG PO TABS
1.0000 mg | ORAL_TABLET | Freq: Every day | ORAL | Status: DC
Start: 2018-09-03 — End: 2018-09-05
  Administered 2018-09-03 – 2018-09-04 (×2): 1 mg via ORAL
  Filled 2018-09-03: qty 1

## 2018-09-03 MED ORDER — ENOXAPARIN SODIUM 40 MG/0.4ML ~~LOC~~ SOLN
40.0000 mg | SUBCUTANEOUS | Status: DC
  Administered 2018-09-03 – 2018-09-04 (×2): 40 mg via SUBCUTANEOUS
  Filled 2018-09-03: qty 0.4

## 2018-09-03 MED ORDER — ACETAMINOPHEN 650 MG RE SUPP: 650.0000 mg | Freq: Four times a day (QID) | RECTAL | Status: DC | PRN

## 2018-09-03 MED ORDER — RISPERIDONE 0.5 MG PO TBDP
ORAL_TABLET | ORAL | Status: AC
  Administered 2018-09-03: 0.5 mg
  Filled 2018-09-03: qty 1

## 2018-09-03 MED ORDER — ATORVASTATIN CALCIUM 20 MG PO TABS
80.0000 mg | ORAL_TABLET | Freq: Every day | ORAL | Status: DC
  Administered 2018-09-03 – 2018-09-04 (×2): 80 mg via ORAL
  Filled 2018-09-03: qty 4

## 2018-09-03 MED ORDER — ONDANSETRON HCL 4 MG PO TABS: 4.0000 mg | ORAL_TABLET | Freq: Four times a day (QID) | ORAL | Status: DC | PRN

## 2018-09-03 MED ORDER — TRAMADOL HCL 50 MG PO TABS
50.0000 mg | ORAL_TABLET | Freq: Four times a day (QID) | ORAL | Status: DC | PRN
  Administered 2018-09-03 – 2018-09-04 (×2): 50 mg via ORAL
  Filled 2018-09-03 (×2): qty 1

## 2018-09-03 MED ORDER — DULOXETINE HCL 30 MG PO CPEP
30.0000 mg | ORAL_CAPSULE | Freq: Every day | ORAL | Status: DC
  Administered 2018-09-04 – 2018-09-05 (×3): 30 mg via ORAL
  Filled 2018-09-03 (×4): qty 1

## 2018-09-03 MED ORDER — NITROGLYCERIN 2 % TD OINT
TOPICAL_OINTMENT | TRANSDERMAL | Status: AC
  Filled 2018-09-03: qty 1

## 2018-09-03 MED ORDER — BUSPIRONE HCL 5 MG PO TABS
ORAL_TABLET | ORAL | Status: AC
  Administered 2018-09-03: 10 mg via ORAL
  Filled 2018-09-03: qty 2

## 2018-09-03 MED ORDER — ACETAMINOPHEN 325 MG PO TABS: 650.0000 mg | ORAL_TABLET | Freq: Four times a day (QID) | ORAL | Status: DC | PRN

## 2018-09-03 MED ORDER — ENOXAPARIN SODIUM 40 MG/0.4ML ~~LOC~~ SOLN
SUBCUTANEOUS | Status: AC
  Administered 2018-09-03: 40 mg via SUBCUTANEOUS
  Filled 2018-09-03: qty 0.4

## 2018-09-03 MED ORDER — LISINOPRIL 10 MG PO TABS
ORAL_TABLET | ORAL | Status: AC
  Administered 2018-09-03: 20 mg via ORAL
  Filled 2018-09-03: qty 2

## 2018-09-03 MED ORDER — ALPRAZOLAM 0.5 MG PO TABS
ORAL_TABLET | ORAL | Status: AC
  Administered 2018-09-03: 1 mg via ORAL
  Filled 2018-09-03: qty 2

## 2018-09-03 MED ORDER — CARVEDILOL 6.25 MG PO TABS
ORAL_TABLET | ORAL | Status: AC
  Administered 2018-09-03: 6.25 mg via ORAL
  Filled 2018-09-03: qty 1

## 2018-09-03 MED ORDER — CARVEDILOL 6.25 MG PO TABS
6.2500 mg | ORAL_TABLET | Freq: Two times a day (BID) | ORAL | Status: DC
  Administered 2018-09-03 – 2018-09-05 (×4): 6.25 mg via ORAL
  Filled 2018-09-03 (×4): qty 1

## 2018-09-03 MED ORDER — CITALOPRAM HYDROBROMIDE 20 MG PO TABS
40.0000 mg | ORAL_TABLET | Freq: Every day | ORAL | Status: DC
  Administered 2018-09-03 – 2018-09-05 (×3): 40 mg via ORAL
  Filled 2018-09-03 (×2): qty 2

## 2018-09-03 MED ORDER — PANTOPRAZOLE SODIUM 20 MG PO TBEC
20.0000 mg | DELAYED_RELEASE_TABLET | Freq: Every day | ORAL | Status: DC
  Administered 2018-09-04 – 2018-09-05 (×2): 20 mg via ORAL
  Filled 2018-09-03 (×2): qty 1

## 2018-09-03 NOTE — ED Notes (Signed)
ED Provider at bedside. 

## 2018-09-03 NOTE — ED Triage Notes (Signed)
Pt reports chest pain that started 15 minutes PTA. Pt states felt like someone stabbed her with a knife to center of her chest. Pt states when pain hit she could not walk. Pt denies SOB, pain that radiates, nausea or other sx's.

## 2018-09-03 NOTE — ED Notes (Signed)
Pt given meal tray.

## 2018-09-03 NOTE — Consult Note (Signed)
[facility] Consult   Reason for Consult: Consult for this 62 year old woman who came to the hospital with chest pain but also revealed herself to be having delusions of parasites Referring Physician: Jacqualine Code Patient Identification: Traci Duran MRN:  [id number] Principal Diagnosis: Moderate recurrent major depression ([place]) Diagnosis:   Patient Active Problem List   Diagnosis Date Noted  . 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 Benzodiazepine abuse ([place]) [F13.10] 09/03/2018  . Moderate recurrent major depression ([place]) [F33.1] 09/03/2018  . Hyponatremia [E87.1] 10/11/2017  . Chest pain [R07.9] 07/28/2017  . Acute blood loss anemia [D62] 07/28/2017  . Demand ischemia ([place]) [I24.8]   . Benign neoplasm of descending colon [D12.4]   . Polyp of sigmoid colon [D12.5]   . Age related osteoporosis [M81.0] 04/16/2017  . 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 GI bleed [K92.2] 04/06/2017  . Diastolic dysfunction [W09.81] 09/30/2016  . Opioid contract exists [Z02.89] 06/27/2016  . Incidental lung nodule [R91.1] 01/18/2016  . Traumatic hemorrhage of cerebrum ([place]) [S06.369A] 12/04/2015  . Hypovitaminosis D [E55.9] 08/29/2015  . Thyroid lesion [E07.89] 08/29/2015  . Major depressive disorder, single episode, unspecified [F32.9] 08/27/2015  . 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 Gastroesophageal reflux disease [K21.9] 01/26/2015  . Osteopenia [M85.80] 01/26/2015  . Anemia, unspecified [D64.9] 12/27/2014  . History of fall [Z91.81] 12/27/2014  . Greater tuberosity of humerus fracture [S42.253A] 10/24/2013  . AVF (arteriovenous fistula) ([place]) [I77.0] 11/08/2011  . Anxiety [F41.9] 10/24/2011  . Cerebrovascular disease [I67.9] 10/24/2011  . Dyslipidemia [E78.5] 10/24/2011  . Hypertension [I10] 10/24/2011  . Tobacco abuse [Z72.0] 10/24/2011  . Palpitations [R00.2] 10/24/2011  . Renal artery stenosis ([place]) [I70.1]  10/24/2011  . Unstable angina ([place]) [I20.0] 10/14/2011  . Hyperlipidemia [E78.5]   . Chronic obstructive pulmonary disease ([place]) [J44.9]   . Depression [F32.9]   . Peripheral arterial disease ([place]) [I73.9]   . Coronary artery disease, non-occlusive [I25.10]     Total Time spent with patient: 1 hour  Subjective:   Traci Duran is a 62 y.o. female patient admitted with "my chest was hurting".  HPI: 62 year old woman came to the emergency room with a chief complaint of chest pain.  During the history however she quickly revealed that she was having delusions of parasitosis.  Patient quickly tells me the story about how she has had on and off chest pain today but then tells me about the bugs and worms that have been crawling in her stool her urine and underneath her skin for about 3 weeks now.  She went to her primary care doctor with this and was told that they could not see anything but continues to believe that this is happening.  Patient says it has been going on for about 3 weeks ever since she discovered that she had "black mold" in her apartment.  Further review of psychiatric symptoms the patient is sleeping very poorly at night.  Trouble getting to sleep and wakes up frequently.  She has been fasting for the past 3 days only consuming clear liquids because she thinks this might help with the parasites.  She says she was planning to stop that after another day.  She is not trying to starve herself intentionally.  Patient's nerves feel stressed out and overwhelmed all the time.  She has been through a huge amount of trauma including 2 adult daughters dying 1 by being hit by a train and another one by overdose both  within the last 4 years.  She has ongoing stress from having her sick sister and another adult child and their whole family living with her all in a 1 bedroom apartment.  Patient denies having any suicidal thoughts but says this is just because she has such powerful religious beliefs.   She denies having any actual hallucinations.  Denies any thoughts of hurting anyone else.  Denies that she has tried to do anything poisonous to herself to treat the parasites.  Patient sees a psychiatrist and is on an antidepressant and on chronic Xanax.  She tells me that she takes Xanax 2 mg 3 times a day.  Checking the controlled substance database her prescription is only written for 60 pills/month.  I pointed this out to the patient that she is only supposed to be taking 2 a day and she says that is probably why she runs out of it so often.  Additionally she is taking OxyContin's that belonged to her sister to treat her chronic back pain.  She also smokes marijuana intermittently.  Social history: Patient has had at least 2 husbands who have died.  2 adult children who of died.  She lives in a 1 bedroom apartment with several other people staying with her all of whom seem to depend on her.  She is completely overwhelmed with this stress but is trying to continue functioning.  Medical history: Atypical chest pain.  Gastric reflux hyperlipidemia high blood pressure COPD  Substance abuse history: Patient says she used to drink too much but stopped a few years ago.  Claims to not drink anymore although she says she will still from time to time have a beer.  As noted above she smokes marijuana occasionally.  She is also misusing her Xanax and misusing her sister's narcotics.  Past Psychiatric History: Patient has long been treated by psychiatrist for depression.  She has never had a hospitalization.  Never had a suicide attempt.  Never previously as far she can recall been treated for anything psychotic or had any mania.  Risk to Self:   Risk to Others:   Prior Inpatient Therapy:   Prior Outpatient Therapy:    Past Medical History:  Past Medical History:  Diagnosis Date  . Anemia   . Anxiety   . COPD (chronic obstructive pulmonary disease) ([place])   . Coronary artery disease   . Depression   .  Gastritis   . GERD (gastroesophageal reflux disease)   . Hemorrhoids   . Hyperlipidemia   . Hypertension   . PAD (peripheral artery disease) ([place])     Past Surgical History:  Procedure Laterality Date  . APPENDECTOMY    . COLONOSCOPY WITH PROPOFOL N/A 06/09/2017   Procedure: COLONOSCOPY WITH PROPOFOL;  Surgeon: Lucilla Lame, MD;  Location: [facility] ENDOSCOPY;  Service: Endoscopy;  Laterality: N/A;  . ESOPHAGOGASTRODUODENOSCOPY N/A 07/30/2017   Procedure: ESOPHAGOGASTRODUODENOSCOPY (EGD);  Surgeon: Lin Landsman, MD;  Location: [facility] ENDOSCOPY;  Service: Gastroenterology;  Laterality: N/A;  . ESOPHAGOGASTRODUODENOSCOPY (EGD) WITH PROPOFOL N/A 04/09/2017   Procedure: ESOPHAGOGASTRODUODENOSCOPY (EGD) WITH PROPOFOL;  Surgeon: Lucilla Lame, MD;  Location: [facility] ENDOSCOPY;  Service: Endoscopy;  Laterality: N/A;  . HIP FRACTURE SURGERY     Family History:  Family History  Problem Relation Age of Onset  . Heart failure Mother   . Heart attack Mother        mother died at 43 of an MI  . Heart attack Father        father  died at 68 of an MI  . Heart attack Brother        Brother had an MI in his 28s   Family Psychiatric  History: Sounds like there are multiple members of her family who have had drug abuse problems Social History:  Social History   Substance and Sexual Activity  Alcohol Use No     Social History   Substance and Sexual Activity  Drug Use Yes  . Types: Marijuana   Comment: monthly    Social History   Socioeconomic History  . Marital status: Widowed    Spouse name: Not on file  . Number of children: Not on file  . Years of education: Not on file  . Highest education level: Not on file  Occupational History  . Not on file  Social Needs  . Financial resource strain: Not hard at all  . Food insecurity:    Worry: Never true    Inability: Never true  . Transportation needs:    Medical: No    Non-medical: No  Tobacco Use  . Smoking status: Current Some Day Smoker     Packs/day: 1.00    Years: 45.00    Pack years: 45.00    Types: Cigarettes  . Smokeless tobacco: Never Used  . Tobacco comment: she feels that the 21 nicotine made her jittery and we suggested a lower level patch  Substance and Sexual Activity  . Alcohol use: No  . Drug use: Yes    Types: Marijuana    Comment: monthly  . Sexual activity: Not Currently  Lifestyle  . Physical activity:    Days per week: 0 days    Minutes per session: 0 min  . Stress: Very much  Relationships  . Social connections:    Talks on phone: Patient refused    Gets together: Patient refused    Attends religious service: Patient refused    Active member of club or organization: Patient refused    Attends meetings of clubs or organizations: Patient refused    Relationship status: Patient refused  Other Topics Concern  . Not on file  Social History Narrative  . Not on file   Additional Social History:    Allergies:   Allergies  Allergen Reactions  . Chantix [Varenicline] Hives, Shortness Of Breath and Palpitations  . Diphenhydramine Hcl Shortness Of Breath  . Pregabalin Other (See Comments)    Falls  . Benadryl [Diphenhydramine Hcl] Rash  . Niacin Rash  . Trazodone Palpitations    Labs:  Results for orders placed or performed during the hospital encounter of 09/03/18 (from the past 48 hour(s))  Basic metabolic panel     Status: Abnormal   Collection Time: 09/03/18 12:11 PM  Result Value Ref Range   Sodium 130 (L) 135 - 145 mmol/L   Potassium 4.1 3.5 - 5.1 mmol/L   Chloride 98 98 - 111 mmol/L   CO2 23 22 - 32 mmol/L   Glucose, Bld 85 70 - 99 mg/dL   BUN 15 8 - 23 mg/dL   Creatinine, Ser 1.07 (H) 0.44 - 1.00 mg/dL   Calcium 8.9 8.9 - 10.3 mg/dL   GFR calc non Af Amer 55 (L) >60 mL/min   GFR calc Af Amer >60 >60 mL/min    Comment: (NOTE) The eGFR has been calculated using the CKD EPI equation. This calculation has not been validated in all clinical situations. eGFR's persistently <60  mL/min signify possible Chronic Kidney Disease.  Anion gap 9 5 - 15    Comment: Performed at [facility], [address] [place], [place] [address]  CBC     Status: Abnormal   Collection Time: 09/03/18 12:11 PM  Result Value Ref Range   WBC 11.7 (H) 3.6 - 11.0 K/uL   RBC 3.73 (L) 3.80 - 5.20 MIL/uL   Hemoglobin 7.5 (L) 12.0 - 16.0 g/dL   HCT 24.8 (L) 35.0 - 47.0 %   MCV 66.6 (L) 80.0 - 100.0 fL   MCH 20.2 (L) 26.0 - 34.0 pg   MCHC 30.3 (L) 32.0 - 36.0 g/dL   RDW 19.0 (H) 11.5 - 14.5 %   Platelets 319 150 - 440 K/uL    Comment: Performed at [facility], [address] [place], [place] [address]  Troponin I     Status: None   Collection Time: 09/03/18 12:11 PM  Result Value Ref Range   Troponin I <0.03 <0.03 ng/mL    Comment: Performed at [facility], [address] [place], [place] [address]  TSH     Status: None   Collection Time: 09/03/18 12:11 PM  Result Value Ref Range   TSH 1.669 0.350 - 4.500 uIU/mL    Comment: Performed by a 3rd Generation assay with a functional sensitivity of <=0.01 uIU/mL. Performed at [facility], [address], [place], [place] [address]     Current Facility-Administered Medications  Medication Dose Route Frequency Provider Last Rate Last Dose  . sodium chloride 0.9 % bolus 500 mL  500 mL Intravenous Once Delman Kitten, MD       Current Outpatient Medications  Medication Sig Dispense Refill  . albuterol (PROVENTIL HFA;VENTOLIN HFA) 108 (90 Base) MCG/ACT inhaler Inhale 1-2 puffs into the lungs every 6 (six) hours as needed for wheezing or shortness of breath.    Marland Kitchen albuterol (PROVENTIL HFA;VENTOLIN HFA) 108 (90 Base) MCG/ACT inhaler Inhale 2 puffs into the lungs every 6 (six) hours as needed for wheezing or shortness of breath. 1 Inhaler 2  . ALPRAZolam (XANAX) 1 MG tablet Take 1 mg by mouth 3 (three) times daily as needed. For anxiety     . amoxicillin (AMOXIL) 500 MG capsule Take 1 capsule (500 mg  total) by mouth 3 (three) times daily. 30 capsule 0  . atorvastatin (LIPITOR) 80 MG tablet Take 80 mg by mouth daily.    . busPIRone (BUSPAR) 10 MG tablet Take 10 mg by mouth daily.   2  . carvedilol (COREG) 6.25 MG tablet Take 6.25 mg by mouth 2 (two) times daily.    . citalopram (CELEXA) 40 MG tablet Take 40 mg by mouth daily.    . clopidogrel (PLAVIX) 75 MG tablet Take 75 mg by mouth daily.    . DULoxetine (CYMBALTA) 30 MG capsule Take 30 mg by mouth daily.    . ferrous sulfate 325 (65 FE) MG tablet Take 1 tablet (325 mg total) by mouth 3 (three) times daily with meals. 90 tablet 3  . gabapentin (NEURONTIN) 300 MG capsule Take 1 capsule by mouth 3 (three) times daily.     Marland Kitchen lisinopril (PRINIVIL,ZESTRIL) 20 MG tablet Take 20 mg by mouth daily.      . Multiple Vitamin (MULTIVITAMIN WITH MINERALS) TABS tablet Take 2 tablets by mouth daily.    . nitroGLYCERIN (NITROSTAT) 0.4 MG SL tablet Place under the tongue.    . pantoprazole (PROTONIX) 20 MG tablet Take 20 mg by mouth daily.    Marland Kitchen  potassium chloride (K-DUR) 10 MEQ tablet Take 1 tablet (10 mEq total) by mouth daily. 7 tablet 0  . traMADol (ULTRAM) 50 MG tablet Take 1 tablet (50 mg total) by mouth every 6 (six) hours as needed. 15 tablet 0  . traMADol (ULTRAM) 50 MG tablet Take 1 tablet (50 mg total) by mouth every 6 (six) hours as needed. Do not fill without albuterol and prednisone 6 tablet 0  . Vitamin D, Ergocalciferol, (DRISDOL) 50000 units CAPS capsule TAKE 1 CAPSULE  50,000 UNITS TOTAL  BY MOUTH ONCE A WEEK  3    Musculoskeletal: Strength & Muscle Tone: within normal limits Gait & Station: normal Patient leans: N/A  Psychiatric Specialty Exam: Physical Exam  Nursing note and vitals reviewed. Constitutional: She appears well-developed and well-nourished.  HENT:  Head: Normocephalic and atraumatic.  Eyes: Pupils are equal, round, and reactive to light. Conjunctivae are normal.  Neck: Normal range of motion.  Cardiovascular:  Regular rhythm and normal heart sounds.  Respiratory: Effort normal. No respiratory distress.  GI: Soft.  Musculoskeletal: Normal range of motion.  Neurological: She is alert.  Skin: Skin is warm and dry.  Psychiatric: Her mood appears anxious. Her speech is tangential. She is not agitated and not aggressive. Thought content is paranoid and delusional. Cognition and memory are normal. She expresses inappropriate judgment. She expresses no homicidal and no suicidal ideation.    Review of Systems  Constitutional: Negative.   HENT: Negative.   Eyes: Negative.   Respiratory: Negative.   Cardiovascular: Negative.   Gastrointestinal: Negative.   Musculoskeletal: Negative.   Skin: Negative.   Neurological: Negative.   Psychiatric/Behavioral: Positive for depression, hallucinations and substance abuse. Negative for memory loss and suicidal ideas. The patient is nervous/anxious and has insomnia.     Blood pressure 129/71, pulse 75, temperature 97.7 F (36.5 C), resp. rate 20, height _0  (1.702 m), weight 63.5 kg, SpO2 98 %.Body mass index is 21.93 kg/m.  General Appearance: Casual  Eye Contact:  Good  Speech:  Clear and Coherent  Volume:  Normal  Mood:  Anxious and Depressed  Affect:  Appropriate  Thought Process:  Goal Directed  Orientation:  Full (Time, Place, and Person)  Thought Content:  Hallucinations: Tactile Visual and Paranoid Ideation  Suicidal Thoughts:  No  Homicidal Thoughts:  No  Memory:  Immediate;   Fair Recent;   Fair Remote;   Fair  Judgement:  Fair  Insight:  Shallow  Psychomotor Activity:  Normal  Concentration:  Concentration: Fair  Recall:  [employer] of Knowledge:  Fair  Language:  Fair  Akathisia:  No  Handed:  Right  AIMS (if indicated):     Assets:  Desire for Improvement Financial Resources/Insurance Housing Resilience  ADL's:  Intact  Cognition:  WNL  Sleep:        Treatment Plan Summary: Medication management and Plan This 62 year old  woman is having symptoms of delusional parasitosis and also chronic symptoms of depression and anxiety.  Combining this with overuse and misuse of substances she has several reasons to be having the visual hallucinations and delusions.  Delusional parasitosis is not uncommon and often from what I have seen occurs in people who are coping with chronic overwhelming stress and is probably a sort of defensive outlet in people who are trying to hold it together despite the amount of stress they are having.  She is not causing herself any harm right now and is certainly not suicidal.  She does not  appear to need psychiatric hospitalization however she needs to have a reorientation of her treatment and needs to be focusing on dealing with her stress.  What I mean is that first of all I told her that fasting is not going to help with the parasites.  She absolutely should not fast but should eat normally and healthfully.  Second of all she should go back to taking her Xanax at the correct dosage of twice a day and not 3 times a day.  Also she should not take any narcotics or any medicines that are not prescribed for her.  I explained how dangerous all of this was.  She probably needs to go back and see her psychiatrist as soon as possible and hopefully have a more complete and honest discussion about all of her symptoms because she probably needs a change of antidepressant and may be an additional antipsychotic.  My recommendation in the short-term is that we try adding 1/2 mg of risperidone to be taken at night.  I told her that this would help her with her sleep which is true but it also may help with both the mood and the psychotic symptoms.  I explained to her that the kind of parasite she is having do not have a simple treatment and that she should certainly not do anything that could cause herself any harm and that sometimes these things just need to run their course.  I am not sure whether she is going to be admitted  to the medical service at this time.  If so please put in a consult if you want and have the psychiatrist on call over the weekend see the patient again.  If not please print out a prescription for the risperidone half milligram at night and give it to her at discharge.  Disposition: No evidence of imminent risk to self or others at present.   Patient does not meet criteria for psychiatric inpatient admission. Supportive therapy provided about ongoing stressors. Discussed crisis plan, support from social network, calling 911, coming to the Emergency Department, and calling Suicide Hotline.  Alethia Berthold, MD 09/03/2018 6:07 PM

## 2018-09-03 NOTE — ED Provider Notes (Signed)
[facility] Emergency Department Provider Note   ____________________________________________   First MD Initiated Contact with Patient 09/03/18 1854     (approximate)  I have reviewed the triage vital signs and the nursing notes.   HISTORY  Chief Complaint Chest Pain    HPI Traci Duran is a 62 y.o. female here for evaluation of chest pain  Patient came for evaluation of chest pain, but again telling me that she is seeing worms in her urine, also bugs on her skin, and that she is seeing bugs walking about on the ceiling.  (However, clearly and my observation there are no bugs or worms noted in the patient's room.)  Patient does report she is had a chest pressure for about 2 to 3 days now, hard to describe comes and goes seems a little worse when she is walking.  No fevers or chills and her pain right now is mild.  She continues to endorse that she is seeing worms in her stool, bugs walking around in her urine and around the toilet, and also crawling about on the ceiling and on her arms at the present time.  Denies headache.  No nausea vomiting.  No fevers or chills.     Past Medical History:  Diagnosis Date  . Anemia   . Anxiety   . COPD (chronic obstructive pulmonary disease) ([place])   . Coronary artery disease   . Depression   . Gastritis   . GERD (gastroesophageal reflux disease)   . Hemorrhoids   . Hyperlipidemia   . Hypertension   . PAD (peripheral artery disease) [facility])     Patient Active Problem List   Diagnosis Date Noted  . 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 Benzodiazepine abuse ([place]) 09/03/2018  . Moderate recurrent major depression ([place]) 09/03/2018  . Hyponatremia 10/11/2017  . Chest pain 07/28/2017  . Acute blood loss anemia 07/28/2017  . Demand ischemia ([place])   . Benign neoplasm of descending colon   . Polyp of sigmoid colon   . Age related osteoporosis 04/16/2017  . Iron deficiency anemia secondary to  blood loss (chronic)   . GI bleed 04/06/2017  . Diastolic dysfunction 95/18/8416  . Opioid contract exists 06/27/2016  . Incidental lung nodule 01/18/2016  . Traumatic hemorrhage of cerebrum ([place]) 12/04/2015  . Hypovitaminosis D 08/29/2015  . Thyroid lesion 08/29/2015  . Major depressive disorder, single episode, unspecified 08/27/2015  . 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 Gastroesophageal reflux disease 01/26/2015  . Osteopenia 01/26/2015  . Anemia, unspecified 12/27/2014  . History of fall 12/27/2014  . Greater tuberosity of humerus fracture 10/24/2013  . AVF (arteriovenous fistula) ([place]) 11/08/2011  . Anxiety 10/24/2011  . Cerebrovascular disease 10/24/2011  . Dyslipidemia 10/24/2011  . Hypertension 10/24/2011  . Tobacco abuse 10/24/2011  . Palpitations 10/24/2011  . Renal artery stenosis ([place]) 10/24/2011  . Unstable angina ([place]) 10/14/2011  . Hyperlipidemia   . Chronic obstructive pulmonary disease ([place])   . Depression   . Peripheral arterial disease ([place])   . Coronary artery disease, non-occlusive     Past Surgical History:  Procedure Laterality Date  . APPENDECTOMY    . COLONOSCOPY WITH PROPOFOL N/A 06/09/2017   Procedure: COLONOSCOPY WITH PROPOFOL;  Surgeon: Lucilla Lame, MD;  Location: [facility] ENDOSCOPY;  Service: Endoscopy;  Laterality: N/A;  . ESOPHAGOGASTRODUODENOSCOPY N/A 07/30/2017   Procedure: ESOPHAGOGASTRODUODENOSCOPY (EGD);  Surgeon: Lin Landsman, MD;  Location: [facility] ENDOSCOPY;  Service: Gastroenterology;  Laterality: N/A;  . ESOPHAGOGASTRODUODENOSCOPY (  EGD) WITH PROPOFOL N/A 04/09/2017   Procedure: ESOPHAGOGASTRODUODENOSCOPY (EGD) WITH PROPOFOL;  Surgeon: Lucilla Lame, MD;  Location: [facility] ENDOSCOPY;  Service: Endoscopy;  Laterality: N/A;  . HIP FRACTURE SURGERY      Prior to Admission medications   Medication Sig Start Date End Date Taking? Authorizing Provider  albuterol (PROVENTIL HFA;VENTOLIN HFA) 108 (90 Base) MCG/ACT inhaler Inhale 1-2 puffs  into the lungs every 6 (six) hours as needed for wheezing or shortness of breath.    [provider]  albuterol (PROVENTIL HFA;VENTOLIN HFA) 108 (90 Base) MCG/ACT inhaler Inhale 2 puffs into the lungs every 6 (six) hours as needed for wheezing or shortness of breath. 04/02/18   Merlyn Lot, MD  ALPRAZolam Duanne Moron) 1 MG tablet Take 1 mg by mouth 3 (three) times daily as needed. For anxiety     [provider]  amoxicillin (AMOXIL) 500 MG capsule Take 1 capsule (500 mg total) by mouth 3 (three) times daily. 07/27/18   Menshew, Dannielle Karvonen, PA-C  atorvastatin (LIPITOR) 80 MG tablet Take 80 mg by mouth daily.    [provider]  busPIRone (BUSPAR) 10 MG tablet Take 10 mg by mouth daily.  04/30/17   [provider]  carvedilol (COREG) 6.25 MG tablet Take 6.25 mg by mouth 2 (two) times daily. 05/28/17   [provider]  citalopram (CELEXA) 40 MG tablet Take 40 mg by mouth daily.    [provider]  clopidogrel (PLAVIX) 75 MG tablet Take 75 mg by mouth daily.    [provider]  DULoxetine (CYMBALTA) 30 MG capsule Take 30 mg by mouth daily. 05/28/17   [provider]  ferrous sulfate 325 (65 FE) MG tablet Take 1 tablet (325 mg total) by mouth 3 (three) times daily with meals. 07/30/17   Fritzi Mandes, MD  gabapentin (NEURONTIN) 300 MG capsule Take 1 capsule by mouth 3 (three) times daily.     [provider]  lisinopril (PRINIVIL,ZESTRIL) 20 MG tablet Take 20 mg by mouth daily.      [provider]  Multiple Vitamin (MULTIVITAMIN WITH MINERALS) TABS tablet Take 2 tablets by mouth daily.    [provider]  nitroGLYCERIN (NITROSTAT) 0.4 MG SL tablet Place under the tongue. 01/24/16   [provider]  pantoprazole (PROTONIX) 20 MG tablet Take 20 mg by mouth daily.    [provider]  potassium chloride (K-DUR) 10 MEQ tablet Take 1 tablet (10 mEq total) by mouth daily. 05/28/17   Earleen Newport, MD  traMADol (ULTRAM) 50 MG tablet Take 1 tablet (50 mg total) by mouth every 6 (six) hours as needed. 07/30/17   Fritzi Mandes, MD  traMADol (ULTRAM) 50 MG tablet Take 1 tablet (50 mg total) by mouth every 6 (six) hours as needed. Do not fill without albuterol and prednisone 04/02/18 04/02/19  Merlyn Lot, MD  Vitamin D, Ergocalciferol, (DRISDOL) 50000 units CAPS capsule TAKE 1 CAPSULE  50,000 UNITS TOTAL  BY MOUTH ONCE A WEEK 05/01/17   [provider]    Allergies Chantix [varenicline]; Diphenhydramine hcl; Pregabalin; Benadryl [diphenhydramine hcl]; Niacin; and Trazodone  Family History  Problem Relation Age of Onset  . Heart failure Mother   . Heart attack Mother        mother died at 36 of an MI  . Heart attack Father        father died at 44 of an MI  . Heart attack Brother  Brother had an MI in his 59s    Social History Social History   Tobacco Use  . Smoking status: Current Some Day Smoker    Packs/day: 1.00    Years: 45.00    Pack years: 45.00    Types: Cigarettes  . Smokeless tobacco: Never Used  . Tobacco comment: she feels that the 21 nicotine made her jittery and we suggested a lower level patch  Substance Use Topics  . Alcohol use: No  . Drug use: Yes    Types: Marijuana    Comment: monthly    Review of Systems Constitutional: No fever/chills does feel fatigued recently Eyes: No visual changes. ENT: No sore throat. Cardiovascular: See HPI respiratory: Denies shortness of breath. Gastrointestinal: No abdominal pain.  Has not seen any black or bloody stool.  Denies vomiting. Genitourinary: Negative for dysuria. Musculoskeletal: Negative for back pain. Skin: Negative for rash. Neurological: Negative for headaches, areas of focal weakness or numbness.    ____________________________________________   PHYSICAL EXAM:  VITAL SIGNS: ED Triage Vitals  Enc Vitals Group     BP 09/03/18 1311 (!) 141/68     Pulse Rate 09/03/18  1311 76     Resp 09/03/18 1311 18     Temp 09/03/18 1311 98.3 F (36.8 C)     Temp Source 09/03/18 1311 Oral     SpO2 09/03/18 1311 96 %     Weight 09/03/18 1202 140 lb (63.5 kg)     Height 09/03/18 1202 5\' 7"  (1.702 m)     Head Circumference --      Peak Flow --      Pain Score 09/03/18 1202 0     Pain Loc --      Pain Edu? --      Excl. in [place]? --     Constitutional: Alert and oriented. Well appearing and in no acute distress. Eyes: Conjunctivae are normal. Head: Atraumatic. Nose: No congestion/rhinnorhea. Mouth/Throat: Mucous membranes are moist. Neck: No stridor.  Cardiovascular: Normal rate, regular rhythm. Grossly normal heart sounds.  Good peripheral circulation. Respiratory: Normal respiratory effort.  No retractions. Lungs CTAB. Gastrointestinal: Soft and nontender. No distention. Musculoskeletal: No lower extremity tenderness nor edema. Neurologic:  Normal speech and language. No gross focal neurologic deficits are appreciated.  Skin:  Skin is warm, dry and intact. No rash noted. Psychiatric: Mood and affect are normal. Speech and behavior are normal.  ____________________________________________   LABS (all labs ordered are listed, but only abnormal results are displayed)  Labs Reviewed  BASIC METABOLIC PANEL - Abnormal; Notable for the following components:      Result Value   Sodium 130 (*)    Creatinine, Ser 1.07 (*)    GFR calc non Af Amer 55 (*)    All other components within normal limits  CBC - Abnormal; Notable for the following components:   WBC 11.7 (*)    RBC 3.73 (*)    Hemoglobin 7.5 (*)    HCT 24.8 (*)    MCV 66.6 (*)    MCH 20.2 (*)    MCHC 30.3 (*)    RDW 19.0 (*)    All other components within normal limits  URINALYSIS, COMPLETE (UACMP) WITH MICROSCOPIC - Abnormal; Notable for the following components:   Color, Urine COLORLESS (*)    APPearance CLEAR (*)    Specific Gravity, Urine 1.001 (*)    All other components within normal limits   TROPONIN I  TSH  HIV ANTIBODY (ROUTINE TESTING  W REFLEX)  BASIC METABOLIC PANEL  CBC  CBC  CREATININE, SERUM  TROPONIN I  TROPONIN I  TROPONIN I  TYPE AND SCREEN   ____________________________________________  EKG  Reviewed and interpreted at 1210 Heart rate 89 QRS 90 QTc 470 Normal sinus rhythm without evidence of acute ischemia ____________________________________________  RADIOLOGY  Dg Chest 2 View  Result Date: 09/03/2018 CLINICAL DATA:  Chest pain. EXAM: CHEST - 2 VIEW COMPARISON:  06/22/2018. FINDINGS: Mediastinum and hilar structures are normal. Lungs are clear. No pleural effusion or pneumothorax. Heart size normal. Degenerative change thoracic spine. Thoracic spine osteopenia degenerative change. Stable thoracic vertebral body compression fractures. Distended loops of bowel noted. Abdominal series can be obtained for further evaluation as needed. IMPRESSION: 1.  No acute cardiopulmonary disease. 2. Distended loops of bowel noted. Abdominal series can be obtained for further evaluation. Electronically Signed   By: Marcello Moores  Register   On: 09/03/2018 13:05   Dg Abdomen 1 View  Result Date: 09/03/2018 CLINICAL DATA:  Abdominal pain. EXAM: ABDOMEN - 1 VIEW COMPARISON:  None. FINDINGS: Moderate stool burden. No bowel obstruction or visible free air. Vascular calcification. LEFT hip ORIF. Small calcifications are seen in the paravertebral region on the RIGHT and LEFT opposite the L1 and L2 vertebrae which could be vascular, but cannot completely exclude renal calculus. If further investigation desired, consider CT urogram. IMPRESSION: No bowel obstruction or visible free air. Moderate stool burden. Vascular calcification. See discussion above. Electronically Signed   By: Staci Righter M.D.   On: 09/03/2018 14:50   Ct Head Wo Contrast  Result Date: 09/03/2018 CLINICAL DATA:  Dizziness and bilateral leg weakness. Hallucinations. EXAM: CT HEAD WITHOUT CONTRAST TECHNIQUE: Contiguous  axial images were obtained from the base of the skull through the vertex without intravenous contrast. COMPARISON:  08/24/2015. FINDINGS: Brain: Diffusely enlarged ventricles and subarachnoid spaces. Patchy white matter low density in both cerebral hemispheres. Old lacunar infarct at the lateral aspect of the right thalamus at the location of previous hemorrhage. Interval small lacunar infarct in the head of the caudate nucleus on the right. Interval mild white matter patchy opacity in both cerebral hemispheres. No intracranial hemorrhage, mass lesion or CT evidence of acute infarction. Vascular: No hyperdense vessel or unexpected calcification. Skull: Normal. Negative for fracture or focal lesion. Sinuses/Orbits: Unremarkable. Other: None. IMPRESSION: 1. No acute abnormality. 2. Mild diffuse cerebral and cerebellar atrophy. 3. Mild chronic small vessel white matter ischemic changes in both cerebral hemispheres. 4. 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 Electronically Signed   By: Claudie Revering M.D.   On: 09/03/2018 17:40    CT head reviewed, no acute findings  ____________________________________________   PROCEDURES  Procedure(s) performed: None  Procedures  Critical Care performed: No  ____________________________________________   INITIAL IMPRESSION / ASSESSMENT AND PLAN / ED COURSE  Pertinent labs & imaging results that were available during my care of the patient were reviewed by me and considered in my medical decision making (see chart for details).   Patient resents for evaluation of chest pain, found to be somewhat worsening anemia versus previous and given her exertional symptoms potential connection though her EKG shows no evidence of ischemia and her troponin is normal.  Additionally she is having delusions, seeing bugs crawling on her for at least the last week or so and saw her PCP for similar and will  evaluate with Dr. Weber Cooks to see patient  regarding this, suspect likely secondary to some type of medical or medication reaction.  Additionally, patient's sodium was noted to be low of 130, will give light IV hydration she appears euvolemic on exam at this time.  Clinical Course as of Sep 03 1933  Fri Sep 03, 2018  1806 Troponin I: <0.03 [PR]    Clinical Course User Index [PR] Merlyn Lot, MD   ----------------------------------------- 7:36 PM on 09/03/2018 -----------------------------------------  Patient being admitted.  Dr. Weber Cooks suspects use of the patient's sisters pain medications and also overuse of Xanax may be likely culprit for her delirious symptoms.  Hospitalist is seen and is admitting for further work-up regarding the chest pain and also observation regarding her delusions and further work-up.  ____________________________________________   FINAL CLINICAL IMPRESSION(S) / ED DIAGNOSES  Final diagnoses:  Hyponatremia  Anemia, unspecified type  Chest pain with low risk for cardiac etiology  Visual hallucination        Note:  This document was prepared using Dragon voice recognition software and may include unintentional dictation errors    Delman Kitten, MD 09/03/18 1937

## 2018-09-03 NOTE — ED Notes (Signed)
Pt given coffee to drink.

## 2018-09-03 NOTE — ED Triage Notes (Signed)
Pt reports she has black mold in her bathroom and thinks that is what is causing her pain. Pt also has bugs in a bottle that she says came from her BR> Bugs have wings.

## 2018-09-03 NOTE — H&P (Signed)
[facility] - [place] [facility]   PATIENT NAME: Traci Duran    MR#:  [id number]  DATE OF BIRTH:  01/13/1956  DATE OF ADMISSION:  09/03/2018  PRIMARY CARE PHYSICIAN: Dan Humphreys, [facility]   REQUESTING/REFERRING PHYSICIAN: Dr. Sharyn Creamer  CHIEF COMPLAINT: Chest pain   Chief Complaint  Patient presents with  . Chest Pain    HISTORY OF PRESENT ILLNESS:  Traci Duran  is a 62 y.o. female with a known history of essential hypertension, anxiety, depression, hyperlipidemia, COPD, CAD comes in because of chest pain on the left side that started this afternoon while she was helping her sister to go to doctor's appointment.  Patient also felt heavy in the legs.  She has multiple psychiatric complaints saying that she is vomiting her urine and also her body is poison with warms and skin infection.  Patient had history of gastritis, anemia with hemoglobin 5.8 last year had EGD which showed NSAID induced gastritis and was told to stop ibuprofen that she was taking for chronic back pain.  Patient has chronic back pain in which she is taking oxycodone for the last 2 days that is prescribed for her sister.  Because of her chest pain, slightly borderline hemoglobin of 7.5 ER physician wanted me to admit the patient.  Patient looks like multiple psychiatric complaints in the chest pain is not her main complaint today.  However because her hemoglobin is 7.5, has chest pain with her history of CAD she will be admitted to overnight observation and get a Lexiscan stress test tomorrow and possible discharge after that.  PAST MEDICAL HISTORY:   Past Medical History:  Diagnosis Date  . Anemia   . Anxiety   . COPD (chronic obstructive pulmonary disease) (HCC)   . Coronary artery disease   . Depression   . Gastritis   . GERD (gastroesophageal reflux disease)   . Hemorrhoids   . Hyperlipidemia   . Hypertension   . PAD (peripheral artery disease) (HCC)     PAST  SURGICAL HISTOIRY:   Past Surgical History:  Procedure Laterality Date  . APPENDECTOMY    . COLONOSCOPY WITH PROPOFOL N/A 06/09/2017   Procedure: COLONOSCOPY WITH PROPOFOL;  Surgeon: Midge Minium, MD;  Location: [facility] ENDOSCOPY;  Service: Endoscopy;  Laterality: N/A;  . ESOPHAGOGASTRODUODENOSCOPY N/A 07/30/2017   Procedure: ESOPHAGOGASTRODUODENOSCOPY (EGD);  Surgeon: Toney Reil, MD;  Location: [facility] ENDOSCOPY;  Service: Gastroenterology;  Laterality: N/A;  . ESOPHAGOGASTRODUODENOSCOPY (EGD) WITH PROPOFOL N/A 04/09/2017   Procedure: ESOPHAGOGASTRODUODENOSCOPY (EGD) WITH PROPOFOL;  Surgeon: Midge Minium, MD;  Location: [facility] ENDOSCOPY;  Service: Endoscopy;  Laterality: N/A;  . HIP FRACTURE SURGERY      SOCIAL HISTORY:   Social History   Tobacco Use  . Smoking status: Current Some Day Smoker    Packs/day: 1.00    Years: 45.00    Pack years: 45.00    Types: Cigarettes  . Smokeless tobacco: Never Used  . Tobacco comment: she feels that the 21 nicotine made her jittery and we suggested a lower level patch  Substance Use Topics  . Alcohol use: No    FAMILY HISTORY:   Family History  Problem Relation Age of Onset  . Heart failure Mother   . Heart attack Mother        mother died at 8 of an MI  . Heart attack Father        father died at 78 of an MI  . Heart attack Brother  Brother had an MI in his 33s    DRUG ALLERGIES:   Allergies  Allergen Reactions  . Chantix [Varenicline] Hives, Shortness Of Breath and Palpitations  . Diphenhydramine Hcl Shortness Of Breath  . Pregabalin Other (See Comments)    Falls  . Benadryl [Diphenhydramine Hcl] Rash  . Niacin Rash  . Trazodone Palpitations    REVIEW OF SYSTEMS:  CONSTITUTIONAL: No fever, fatigue or weakness.  Heaviness in the legs EYES: No blurred or double vision.  EARS, NOSE, AND THROAT: No tinnitus or ear pain.  RESPIRATORY: No cough, shortness of breath, wheezing or hemoptysis.  CARDIOVASCULAR: No chest pain,  orthopnea, edema.  GASTROINTESTINAL: No nausea, vomiting, diarrhea or abdominal pain.  GENITOURINARY: No dysuria, hematuria.  ENDOCRINE: No polyuria, nocturia,  HEMATOLOGY: No anemia, easy bruising or bleeding SKIN: No rash or lesion. MUSCULOSKELETAL: No joint pain or arthritis.  Chronic back pain NEUROLOGIC: No tingling, numbness, weakness.  PSYCHIATRY: Anxiety, depression  MEDICATIONS AT HOME:   Prior to Admission medications   Medication Sig Start Date End Date Taking? Authorizing Provider  albuterol (PROVENTIL HFA;VENTOLIN HFA) 108 (90 Base) MCG/ACT inhaler Inhale 1-2 puffs into the lungs every 6 (six) hours as needed for wheezing or shortness of breath.    [provider]  albuterol (PROVENTIL HFA;VENTOLIN HFA) 108 (90 Base) MCG/ACT inhaler Inhale 2 puffs into the lungs every 6 (six) hours as needed for wheezing or shortness of breath. 04/02/18   Willy Eddy, MD  ALPRAZolam Prudy Feeler) 1 MG tablet Take 1 mg by mouth 3 (three) times daily as needed. For anxiety     [provider]  amoxicillin (AMOXIL) 500 MG capsule Take 1 capsule (500 mg total) by mouth 3 (three) times daily. 07/27/18   Menshew, Charlesetta Ivory, PA-C  atorvastatin (LIPITOR) 80 MG tablet Take 80 mg by mouth daily.    [provider]  busPIRone (BUSPAR) 10 MG tablet Take 10 mg by mouth daily.  04/30/17   [provider]  carvedilol (COREG) 6.25 MG tablet Take 6.25 mg by mouth 2 (two) times daily. 05/28/17   [provider]  citalopram (CELEXA) 40 MG tablet Take 40 mg by mouth daily.    [provider]  clopidogrel (PLAVIX) 75 MG tablet Take 75 mg by mouth daily.    [provider]  DULoxetine (CYMBALTA) 30 MG capsule Take 30 mg by mouth daily. 05/28/17   [provider]  ferrous sulfate 325 (65 FE) MG tablet Take 1 tablet (325 mg total) by mouth 3 (three) times daily with meals. 07/30/17   Enedina Finner, MD  gabapentin (NEURONTIN) 300 MG capsule Take 1  capsule by mouth 3 (three) times daily.     [provider]  lisinopril (PRINIVIL,ZESTRIL) 20 MG tablet Take 20 mg by mouth daily.      [provider]  Multiple Vitamin (MULTIVITAMIN WITH MINERALS) TABS tablet Take 2 tablets by mouth daily.    [provider]  nitroGLYCERIN (NITROSTAT) 0.4 MG SL tablet Place under the tongue. 01/24/16   [provider]  pantoprazole (PROTONIX) 20 MG tablet Take 20 mg by mouth daily.    [provider]  potassium chloride (K-DUR) 10 MEQ tablet Take 1 tablet (10 mEq total) by mouth daily. 05/28/17   Emily Filbert, MD  traMADol (ULTRAM) 50 MG tablet Take 1 tablet (50 mg total) by mouth every 6 (six) hours as needed. 07/30/17   Enedina Finner, MD  traMADol (ULTRAM) 50 MG tablet Take 1 tablet (50  mg total) by mouth every 6 (six) hours as needed. Do not fill without albuterol and prednisone 04/02/18 04/02/19  Willy Eddy, MD  Vitamin D, Ergocalciferol, (DRISDOL) 50000 units CAPS capsule TAKE 1 CAPSULE  50,000 UNITS TOTAL  BY MOUTH ONCE A WEEK 05/01/17   [provider]      VITAL SIGNS:  Blood pressure (!) 167/75, pulse 76, temperature 97.7 F (36.5 C), resp. rate 18, height 5\' 7"  (1.702 m), weight 63.5 kg, SpO2 100 %.  PHYSICAL EXAMINATION:  GENERAL:  62 y.o.-year-old patient lying in the bed with no acute distress.  EYES: Pupils equal, round, reactive to light and accommodation. No scleral icterus. Extraocular muscles intact.  HEENT: Head atraumatic, normocephalic. Oropharynx and nasopharynx clear.  NECK:  Supple, no jugular venous distention. No thyroid enlargement, no tenderness.  LUNGS: Normal breath sounds bilaterally, no wheezing, rales,rhonchi or crepitation. No use of accessory muscles of respiration.  CARDIOVASCULAR: S1, S2 normal. No murmurs, rubs, or gallops.  ABDOMEN: Soft, nontender, nondistended. Bowel sounds present. No organomegaly or mass.  EXTREMITIES: No pedal edema, cyanosis, or  clubbing.  NEUROLOGIC: Cranial nerves II through XII are intact. Muscle strength 5/5 in all extremities. Sensation intact. Gait not checked.  PSYCHIATRIC: Patient complains of bugs crawling her stool and also urine and also underneath her skin for about 3 weeks she went to PCP for this, according to her the could not find anything.  SKIN: No obvious rash, lesion, or ulcer.   LABORATORY PANEL:   CBC Recent Labs  Lab 09/03/18 1211  WBC 11.7*  HGB 7.5*  HCT 24.8*  PLT 319   ------------------------------------------------------------------------------------------------------------------  Chemistries  Recent Labs  Lab 09/03/18 1211  NA 130*  K 4.1  CL 98  CO2 23  GLUCOSE 85  BUN 15  CREATININE 1.07*  CALCIUM 8.9   ------------------------------------------------------------------------------------------------------------------  Cardiac Enzymes Recent Labs  Lab 09/03/18 1211  TROPONINI <0.03   ------------------------------------------------------------------------------------------------------------------  RADIOLOGY:  Dg Chest 2 View  Result Date: 09/03/2018 CLINICAL DATA:  Chest pain. EXAM: CHEST - 2 VIEW COMPARISON:  06/22/2018. FINDINGS: Mediastinum and hilar structures are normal. Lungs are clear. No pleural effusion or pneumothorax. Heart size normal. Degenerative change thoracic spine. Thoracic spine osteopenia degenerative change. Stable thoracic vertebral body compression fractures. Distended loops of bowel noted. Abdominal series can be obtained for further evaluation as needed. IMPRESSION: 1.  No acute cardiopulmonary disease. 2. Distended loops of bowel noted. Abdominal series can be obtained for further evaluation. Electronically Signed   By: Maisie Fus  Register   On: 09/03/2018 13:05   Dg Abdomen 1 View  Result Date: 09/03/2018 CLINICAL DATA:  Abdominal pain. EXAM: ABDOMEN - 1 VIEW COMPARISON:  None. FINDINGS: Moderate stool burden. No bowel obstruction or  visible free air. Vascular calcification. LEFT hip ORIF. Small calcifications are seen in the paravertebral region on the RIGHT and LEFT opposite the L1 and L2 vertebrae which could be vascular, but cannot completely exclude renal calculus. If further investigation desired, consider CT urogram. IMPRESSION: No bowel obstruction or visible free air. Moderate stool burden. Vascular calcification. See discussion above. Electronically Signed   By: Elsie Stain M.D.   On: 09/03/2018 14:50   Ct Head Wo Contrast  Result Date: 09/03/2018 CLINICAL DATA:  Dizziness and bilateral leg weakness. Hallucinations. EXAM: CT HEAD WITHOUT CONTRAST TECHNIQUE: Contiguous axial images were obtained from the base of the skull through the vertex without intravenous contrast. COMPARISON:  08/24/2015. FINDINGS: Brain: Diffusely enlarged ventricles and subarachnoid spaces. Patchy white matter low density  in both cerebral hemispheres. Old lacunar infarct at the lateral aspect of the right thalamus at the location of previous hemorrhage. Interval small lacunar infarct in the head of the caudate nucleus on the right. Interval mild white matter patchy opacity in both cerebral hemispheres. No intracranial hemorrhage, mass lesion or CT evidence of acute infarction. Vascular: No hyperdense vessel or unexpected calcification. Skull: Normal. Negative for fracture or focal lesion. Sinuses/Orbits: Unremarkable. Other: None. IMPRESSION: 1. No acute abnormality. 2. Mild diffuse cerebral and cerebellar atrophy. 3. Mild chronic small vessel white matter ischemic changes in both cerebral hemispheres. 4. 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 Electronically Signed   By: Beckie Salts M.D.   On: 09/03/2018 17:40    EKG:   Orders placed or performed during the hospital encounter of 09/03/18  . ED EKG within 10 minutes  . ED EKG within 10 minutes  Shows normal sinus rhythm  with 90 bpm without evidence of ischemia.  IMPRESSION AND PLAN:  62 year old female patient multiple medical problems of anxiety, depression,, hypertension, hyperlipidemia, COPD comes in because of left-sided chest pain, also having some visual hallucinations thinking that she is seeing warmth in the urine and also under her skin and also on the ceiling. 1.  Left-sided chest pressure likely related by her anxiety attack: Having lots of stress in the family.  However due to her advanced age and underlying cardiac history cycle the troponins, Lexiscan stress test tomorrow.,  Possible discharge if the Lexiscan stress test is negative.  Patient has a history of CAD, previous echo showed EF more than 55%. 2.  History of delusional parasitosis, major depression, seen by Dr. Toni Amend from psychiatric recommended continuing Xanax, adding small dose of Risperdal at night.   #3, substance abuse disorder: 4.  History of chronic gastritis, GI bleed before had EGD last year showed NSAID induced gastritis  Patient also had colonoscopy showed polyp in sigmoid, descending colon which were not adenomatous or hyperplastic.  Patient hemoglobin was 9.3 a month ago and dropped to 7.5 so we will give 1 unit of blood transfusion before discharge. #5. patient had a previous stroke.,  Carotid artery stenosis, previous nuclear stress test in 2016 did not show any ischemia as per her records from [facility] system.    All the records are reviewed and case discussed with ED provider. Management plans discussed with the patient, family and they are in agreement.  CODE STATUS: Full code  TOTAL TIME TAKING CARE OF THIS PATIENT: 55 minutes.    Katha Hamming M.D on 09/03/2018 at 7:44 PM  Between 7am to 6pm - Pager - [phone]  After 6pm go to [email or web address] - password [email or web address]  [facility]  Office  [phone]  CC: Primary care physician; [name]  Note: This  dictation was prepared with Dragon dictation along with smaller phrase technology. Any transcriptional errors that result from this process are unintentional.

## 2018-09-04 ENCOUNTER — Other Ambulatory Visit: Payer: Self-pay

## 2018-09-04 ENCOUNTER — Observation Stay: Payer: Medicare Other

## 2018-09-04 LAB — BASIC METABOLIC PANEL
Anion gap: 7 (ref 5–15)
BUN: 13 mg/dL (ref 8–23)
CHLORIDE: 111 mmol/L (ref 98–111)
CO2: 25 mmol/L (ref 22–32)
Calcium: 8.8 mg/dL — ABNORMAL LOW (ref 8.9–10.3)
Creatinine, Ser: 0.95 mg/dL (ref 0.44–1.00)
GFR calc non Af Amer: >60 mL/min (ref >60)
Glucose, Bld: 83 mg/dL (ref 70–99)
Potassium: 4.3 mmol/L (ref 3.5–5.1)
Sodium: 143 mmol/L (ref 135–145)

## 2018-09-04 LAB — CBC
HCT: 23.2 % — ABNORMAL LOW (ref 35.0–47.0)
Hemoglobin: 7.4 g/dL — ABNORMAL LOW (ref 12.0–16.0)
MCH: 21.4 pg — AB (ref 26.0–34.0)
MCHC: 32.1 g/dL (ref 32.0–36.0)
MCV: 66.8 fL — AB (ref 80.0–100.0)
Platelets: 257 10*3/uL (ref 150–440)
RBC: 3.48 MIL/uL — AB (ref 3.80–5.20)
RDW: 19.1 % — ABNORMAL HIGH (ref 11.5–14.5)
WBC: 7.2 10*3/uL (ref 3.6–11.0)

## 2018-09-04 LAB — TROPONIN I
Troponin I: <0.03 ng/mL (ref <0.03)
Troponin I: <0.03 ng/mL (ref <0.03)

## 2018-09-04 LAB — PREPARE RBC (CROSSMATCH)

## 2018-09-04 MED ORDER — TECHNETIUM TC 99M TETROFOSMIN IV KIT
30.8200 | PACK | Freq: Once | INTRAVENOUS | Status: AC | PRN
  Administered 2018-09-04: 30.82 via INTRAVENOUS

## 2018-09-04 MED ORDER — SODIUM CHLORIDE 0.9% IV SOLUTION
Freq: Once | INTRAVENOUS | Status: AC
  Administered 2018-09-04: 15:00:00 via INTRAVENOUS

## 2018-09-04 MED ORDER — REGADENOSON 0.4 MG/5ML IV SOLN
0.4000 mg | Freq: Once | INTRAVENOUS | Status: AC
  Administered 2018-09-04: 0.4 mg via INTRAVENOUS

## 2018-09-04 MED ORDER — ALPRAZOLAM 1 MG PO TABS
1.0000 mg | ORAL_TABLET | ORAL | Status: AC
  Administered 2018-09-04: 1 mg via ORAL
  Filled 2018-09-04: qty 1

## 2018-09-04 MED ORDER — TECHNETIUM TC 99M TETROFOSMIN IV KIT
10.9800 | PACK | Freq: Once | INTRAVENOUS | Status: AC | PRN
  Administered 2018-09-04: 10.98 via INTRAVENOUS

## 2018-09-04 NOTE — ED Notes (Signed)
Called to give report to 2A. Notified by charge nurse that they are not taking patients at this time. ED charge aware

## 2018-09-04 NOTE — Plan of Care (Signed)

## 2018-09-04 NOTE — Progress Notes (Signed)
[facility] - [place] at [facility]   PATIENT NAME: Traci Duran    MR#:  [id number]  DATE OF BIRTH:  [birth year]  SUBJECTIVE:  CHIEF COMPLAINT:   Chief Complaint  Patient presents with  . Chest Pain   Came with chest pain.  Also noted to have slightly low hemoglobin.  Patient denies using over-the-counter pain medication and NSAIDs.  She takes aspirin every day.  She denies noticing any blood in her stool or vomiting. No more chest pain now.  Finished stress test.  REVIEW OF SYSTEMS:  CONSTITUTIONAL: No fever, fatigue or weakness.  EYES: No blurred or double vision.  EARS, NOSE, AND THROAT: No tinnitus or ear pain.  RESPIRATORY: No cough, shortness of breath, wheezing or hemoptysis.  CARDIOVASCULAR: No chest pain, orthopnea, edema.  GASTROINTESTINAL: No nausea, vomiting, diarrhea or abdominal pain.  GENITOURINARY: No dysuria, hematuria.  ENDOCRINE: No polyuria, nocturia,  HEMATOLOGY: No anemia, easy bruising or bleeding SKIN: No rash or lesion. MUSCULOSKELETAL: No joint pain or arthritis.   NEUROLOGIC: No tingling, numbness, weakness.  PSYCHIATRY: No anxiety or depression.   ROS  DRUG ALLERGIES:   Allergies  Allergen Reactions  . Chantix [Varenicline] Hives, Shortness Of Breath and Palpitations  . Diphenhydramine Hcl Shortness Of Breath  . Pregabalin Other (See Comments)    Falls  . Benadryl [Diphenhydramine Hcl] Rash  . Niacin Rash  . Trazodone Palpitations    VITALS:  Blood pressure 136/75, pulse 75, temperature 98.4 F (36.9 C), temperature source Oral, resp. rate 16, height 5\' 7"  (1.702 m), weight 62.5 kg, SpO2 95 %.  PHYSICAL EXAMINATION:  GENERAL:  62 y.o.-year-old patient lying in the bed with no acute distress.  EYES: Pupils equal, round, reactive to light and accommodation. No scleral icterus. Extraocular muscles intact.  HEENT: Head atraumatic, normocephalic. Oropharynx and nasopharynx clear.  NECK:  Supple, no jugular venous  distention. No thyroid enlargement, no tenderness.  LUNGS: Normal breath sounds bilaterally, no wheezing, rales,rhonchi or crepitation. No use of accessory muscles of respiration.  CARDIOVASCULAR: S1, S2 normal.  Systolic murmurs, no rubs, or gallops.  ABDOMEN: Soft, nontender, nondistended. Bowel sounds present. No organomegaly or mass.  EXTREMITIES: No pedal edema, cyanosis, or clubbing.  NEUROLOGIC: Cranial nerves II through XII are intact. Muscle strength 5/5 in all extremities. Sensation intact. Gait not checked.  PSYCHIATRIC: The patient is alert and oriented x 3.  SKIN: No obvious rash, lesion, or ulcer.   Physical Exam LABORATORY PANEL:   CBC Recent Labs  Lab 09/04/18 0704  WBC 7.2  HGB 7.4*  HCT 23.2*  PLT 257   ------------------------------------------------------------------------------------------------------------------  Chemistries  Recent Labs  Lab 09/04/18 0704  NA 143  K 4.3  CL 111  CO2 25  GLUCOSE 83  BUN 13  CREATININE 0.95  CALCIUM 8.8*   ------------------------------------------------------------------------------------------------------------------  Cardiac Enzymes Recent Labs  Lab 09/04/18 0704 09/04/18 1219  TROPONINI <0.03 <0.03   ------------------------------------------------------------------------------------------------------------------  RADIOLOGY:  Dg Chest 2 View  Result Date: 09/03/2018 CLINICAL DATA:  Chest pain. EXAM: CHEST - 2 VIEW COMPARISON:  06/22/2018. FINDINGS: Mediastinum and hilar structures are normal. Lungs are clear. No pleural effusion or pneumothorax. Heart size normal. Degenerative change thoracic spine. Thoracic spine osteopenia degenerative change. Stable thoracic vertebral body compression fractures. Distended loops of bowel noted. Abdominal series can be obtained for further evaluation as needed. IMPRESSION: 1.  No acute cardiopulmonary disease. 2. Distended loops of bowel noted. Abdominal series can be  obtained for further evaluation. Electronically Signed   By:  Thomas  Register   On: 09/03/2018 13:05   Dg Abdomen 1 View  Result Date: 09/03/2018 CLINICAL DATA:  Abdominal pain. EXAM: ABDOMEN - 1 VIEW COMPARISON:  None. FINDINGS: Moderate stool burden. No bowel obstruction or visible free air. Vascular calcification. LEFT hip ORIF. Small calcifications are seen in the paravertebral region on the RIGHT and LEFT opposite the L1 and L2 vertebrae which could be vascular, but cannot completely exclude renal calculus. If further investigation desired, consider CT urogram. IMPRESSION: No bowel obstruction or visible free air. Moderate stool burden. Vascular calcification. See discussion above. Electronically Signed   By: Elsie Stain M.D.   On: 09/03/2018 14:50   Ct Head Wo Contrast  Result Date: 09/03/2018 CLINICAL DATA:  Dizziness and bilateral leg weakness. Hallucinations. EXAM: CT HEAD WITHOUT CONTRAST TECHNIQUE: Contiguous axial images were obtained from the base of the skull through the vertex without intravenous contrast. COMPARISON:  08/24/2015. FINDINGS: Brain: Diffusely enlarged ventricles and subarachnoid spaces. Patchy white matter low density in both cerebral hemispheres. Old lacunar infarct at the lateral aspect of the right thalamus at the location of previous hemorrhage. Interval small lacunar infarct in the head of the caudate nucleus on the right. Interval mild white matter patchy opacity in both cerebral hemispheres. No intracranial hemorrhage, mass lesion or CT evidence of acute infarction. Vascular: No hyperdense vessel or unexpected calcification. Skull: Normal. Negative for fracture or focal lesion. Sinuses/Orbits: Unremarkable. Other: None. IMPRESSION: 1. No acute abnormality. 2. Mild diffuse cerebral and cerebellar atrophy. 3. Mild chronic small vessel white matter ischemic changes in both cerebral hemispheres. 4. Old lacunar infarcts at the lateral aspect of the right thalamus and in the  head of the caudate nucleus on the right. Electronically Signed   By: Beckie Salts M.D.   On: 09/03/2018 17:40    ASSESSMENT AND PLAN:   Principal Problem:   Moderate recurrent major depression (HCC) Active Problems:   Chest pain   Ekbom's delusional parasitosis (HCC)   Benzodiazepine abuse (HCC)  62 year old female patient multiple medical problems of anxiety, depression,, hypertension, hyperlipidemia, COPD comes in because of left-sided chest pain, also having some visual hallucinations thinking that she is seeing warmth in the urine and also under her skin and also on the ceiling. 1.  Left-sided chest pressure likely related by her anxiety attack: Having lots of stress in the family.  However due to her advanced age and underlying cardiac history cycle the troponins, Lexiscan stress test done and awaited results.,  Possible discharge if the Lexiscan stress test is negative.  Patient has a history of CAD, previous echo showed EF more than 55%.  2.  History of delusional parasitosis, major depression, seen by Dr. Toni Amend from psychiatric recommended continuing Xanax, adding small dose of Risperdal at night.    3, substance abuse disorder: Counseled.  4.  History of chronic gastritis, GI bleed before had EGD last year showed NSAID induced gastritis  Patient also had colonoscopy showed polyp in sigmoid, descending colon which were not adenomatous or hyperplastic.  Patient hemoglobin was 9.3 a month ago and dropped to 7.5 so we will give 1 unit of blood transfusion before discharge.  5. patient had a previous stroke.,  Carotid artery stenosis, previous nuclear stress test in 2016 did not show any ischemia as per her records from [facility] system.   All the records are reviewed and case discussed with Care Management/Social Workerr. Management plans discussed with the patient, family and they are in agreement.  CODE  STATUS: Full.  TOTAL TIME TAKING CARE OF THIS PATIENT: 35  minutes.     POSSIBLE D/C IN 1-2 DAYS, DEPENDING ON CLINICAL CONDITION.   Altamese Dilling M.D on 09/04/2018   Between 7am to 6pm - Pager - [phone]  After 6pm go to [email or web address] - password [email or web address]  [facility]  Office  [phone]  CC: Primary care physician; [name]  Note: This dictation was prepared with Dragon dictation along with smaller phrase technology. Any transcriptional errors that result from this process are unintentional.

## 2018-09-04 NOTE — Care Management Obs Status (Signed)
[place] NOTIFICATION   Patient Details  Name: Traci Duran MRN: [id number] Date of Birth: [birth year]   Medicare Observation Status Notification Given:  Yes    Latanya Maudlin, RN 09/04/2018, 3:49 PM

## 2018-09-04 NOTE — ED Notes (Signed)
Pt to stress test

## 2018-09-05 LAB — BASIC METABOLIC PANEL
ANION GAP: 6 (ref 5–15)
BUN: 17 mg/dL (ref 8–23)
CALCIUM: 8.8 mg/dL — AB (ref 8.9–10.3)
CO2: 26 mmol/L (ref 22–32)
CREATININE: 0.83 mg/dL (ref 0.44–1.00)
Chloride: 107 mmol/L (ref 98–111)
GFR calc Af Amer: >60 mL/min (ref >60)
Glucose, Bld: 88 mg/dL (ref 70–99)
Potassium: 4 mmol/L (ref 3.5–5.1)
SODIUM: 139 mmol/L (ref 135–145)

## 2018-09-05 LAB — CBC
HCT: 29 % — ABNORMAL LOW (ref 35.0–47.0)
Hemoglobin: 9.2 g/dL — ABNORMAL LOW (ref 12.0–16.0)
MCH: 22.3 pg — ABNORMAL LOW (ref 26.0–34.0)
MCHC: 31.9 g/dL — ABNORMAL LOW (ref 32.0–36.0)
MCV: 70.2 fL — ABNORMAL LOW (ref 80.0–100.0)
PLATELETS: 275 10*3/uL (ref 150–440)
RBC: 4.13 MIL/uL (ref 3.80–5.20)
RDW: 21.3 % — ABNORMAL HIGH (ref 11.5–14.5)
WBC: 6.3 10*3/uL (ref 3.6–11.0)

## 2018-09-05 LAB — BPAM RBC
Blood Product Expiration Date: 201910222359
ISSUE DATE / TIME: 201909281617
Unit Type and Rh: 5100

## 2018-09-05 LAB — NM MYOCAR MULTI W/SPECT W/WALL MOTION / EF
CHL CUP NUCLEAR SSS: 2
Estimated workload: 1 METS
Exercise duration (min): 1 min
Exercise duration (sec): 1 s
LV dias vol: 50 mL (ref 46–106)
LVSYSVOL: 14 mL
MPHR: 159 {beats}/min
Peak HR: 86 {beats}/min
Percent HR: 54 %
Rest HR: 69 {beats}/min
SDS: 2
SRS: 0
TID: 1

## 2018-09-05 LAB — TYPE AND SCREEN
ABO/RH(D): O POS
Antibody Screen: NEGATIVE
UNIT DIVISION: 0

## 2018-09-05 LAB — HIV ANTIBODY (ROUTINE TESTING W REFLEX): HIV SCREEN 4TH GENERATION: NONREACTIVE

## 2018-09-05 LAB — GLUCOSE, CAPILLARY: Glucose-Capillary: 79 mg/dL (ref 70–99)

## 2018-09-05 MED ORDER — ALPRAZOLAM 1 MG PO TABS
1.0000 mg | ORAL_TABLET | Freq: Once | ORAL | Status: AC
  Administered 2018-09-05: 1 mg via ORAL
  Filled 2018-09-05: qty 1

## 2018-09-05 MED ORDER — GABAPENTIN 600 MG PO TABS
300.0000 mg | ORAL_TABLET | Freq: Three times a day (TID) | ORAL | Status: DC
  Administered 2018-09-05: 300 mg via ORAL
  Filled 2018-09-05: qty 1

## 2018-09-05 NOTE — Progress Notes (Signed)
Patient is discharging home. IV & Heart monitor removed. Discharge instructions and education reviewed with patient.

## 2018-09-05 NOTE — Discharge Instructions (Signed)
Follow with cardiology clinic in nwxt week to schedule for cath as out pt.

## 2018-09-09 ENCOUNTER — Telehealth: Payer: Self-pay | Admitting: *Deleted

## 2018-09-09 ENCOUNTER — Encounter: Payer: Self-pay | Admitting: *Deleted

## 2018-09-09 DIAGNOSIS — Z122 Encounter for screening for malignant neoplasm of respiratory organs: Secondary | ICD-10-CM

## 2018-09-09 NOTE — Telephone Encounter (Signed)
Patient has been notified that the annual lung cancer screening low dose CT scan is due currently or will be in the near future.  Confirmed that the patient is within the age range of 56-80, and asymptomatic, and currently exhibits no signs or symptoms of lung cancer.  Patient denies illness that would prevent curative treatment for lung cancer if found.  Verified smoking history, current smoker with [id number] history .  The shared decision making visit was completed on 09-30-17.  Patient is agreeable for the CT scan to be scheduled.  Will call patient back with date and time of appointment.

## 2018-09-10 ENCOUNTER — Telehealth: Payer: Self-pay | Admitting: *Deleted

## 2018-09-10 NOTE — Telephone Encounter (Signed)
Called patient to inform her of appt for ldct screening on Tuesday 10/05/2018 @ 2:00pm here @ [facility]. Voiced understanding.

## 2018-09-13 NOTE — Discharge Summary (Signed)
[facility] [place] at [facility]   PATIENT NAME: Traci Duran    MR#:  [id number]  DATE OF BIRTH:  [birth year]  DATE OF ADMISSION:  09/03/2018 ADMITTING PHYSICIAN: Katha Hamming, MD  DATE OF DISCHARGE: 09/05/2018 12:50 PM  PRIMARY CARE PHYSICIAN: [place], [facility]    ADMISSION DIAGNOSIS:  Visual hallucination [R44.1] Hyponatremia [E87.1] Chest pain with low risk for cardiac etiology [R07.9] Anemia, unspecified type [D64.9]  DISCHARGE DIAGNOSIS:  Principal Problem:   Moderate recurrent major depression (HCC) Active Problems:   Chest pain   Ekbom's delusional parasitosis (HCC)   Benzodiazepine abuse (HCC)   SECONDARY DIAGNOSIS:   Past Medical History:  Diagnosis Date  . Anemia   . Anxiety   . COPD (chronic obstructive pulmonary disease) (HCC)   . Coronary artery disease   . Depression   . Gastritis   . GERD (gastroesophageal reflux disease)   . Hemorrhoids   . Hyperlipidemia   . Hypertension   . PAD (peripheral artery disease) [facility])     HOSPITAL COURSE:   62 year old female patient multiple medical problems of anxiety, depression,, hypertension, hyperlipidemia, COPD comes in because of left-sided chest pain, also having some visual hallucinations thinking that she is seeing warmth in the urine and also under her skin and also on the ceiling. 1. Left-sided chest pressure likely related by her anxiety attack: Having lots of stress in the family. However due to her advanced age and underlying cardiac history cycle the troponins, Lexiscan stress test done and awaited results., Reported intermediate study and possible ischemia- EF more than 55%. Cardiologist suggest to follow as out pt in 1 week.  2. History of delusional parasitosis, major depression, seen by Dr. Toni Amend from psychiatric recommended continuing Xanax, adding small dose of Risperdal at night.   3,substance abuse disorder: Counseled.  4. History of  chronic gastritis, GI bleed before had EGD last year showed NSAID induced gastritis Patient also had colonoscopy showed polyp in sigmoid, descending colon which were not adenomatous or hyperplastic. Patient hemoglobin was 9.3 a month ago and dropped to 7.5 so we will give 1 unit of blood transfusion before discharge.  5.patient had a previous stroke., Carotid artery stenosis, previous nuclear stress test in 2016 did not show any ischemia as per her records from [facility] system.   DISCHARGE CONDITIONS:   Stable.  CONSULTS OBTAINED:    DRUG ALLERGIES:   Allergies  Allergen Reactions  . Chantix [Varenicline] Hives, Shortness Of Breath and Palpitations  . Diphenhydramine Hcl Shortness Of Breath  . Pregabalin Other (See Comments)    Falls  . Benadryl [Diphenhydramine Hcl] Rash  . Niacin Rash  . Trazodone Palpitations    DISCHARGE MEDICATIONS:   Allergies as of 09/05/2018      Reactions   Chantix [varenicline] Hives, Shortness Of Breath, Palpitations   Diphenhydramine Hcl Shortness Of Breath   Pregabalin Other (See Comments)   Falls   Benadryl [diphenhydramine Hcl] Rash   Niacin Rash   Trazodone Palpitations      Medication List    STOP taking these medications   amoxicillin 500 MG capsule Commonly known as:  AMOXIL     TAKE these medications   albuterol 108 (90 Base) MCG/ACT inhaler Commonly known as:  PROVENTIL HFA;VENTOLIN HFA Inhale 2 puffs into the lungs every 6 (six) hours as needed for wheezing or shortness of breath. Notes to patient:  Use as needed   alprazolam 2 MG tablet Commonly known as:  Prudy Feeler  Take 2 mg by mouth 2 (two) times daily.   atorvastatin 80 MG tablet Commonly known as:  LIPITOR Take 80 mg by mouth daily.   betamethasone valerate ointment 0.1 % Commonly known as:  VALISONE Apply 1 application topically 2 (two) times daily. Notes to patient:  Resume as normal   busPIRone 10 MG tablet Commonly known as:  BUSPAR Take  10 mg by mouth 2 (two) times daily.   carvedilol 6.25 MG tablet Commonly known as:  COREG Take 6.25 mg by mouth 2 (two) times daily.   cetirizine 10 MG tablet Commonly known as:  ZYRTEC Take 10 mg by mouth daily. Notes to patient:  Resume as normal   citalopram 40 MG tablet Commonly known as:  CELEXA Take 40 mg by mouth daily.   clopidogrel 75 MG tablet Commonly known as:  PLAVIX Take 75 mg by mouth daily.   DULoxetine 30 MG capsule Commonly known as:  CYMBALTA Take 30 mg by mouth daily.   ferrous sulfate 325 (65 FE) MG tablet Take 1 tablet (325 mg total) by mouth 3 (three) times daily with meals.   gabapentin 300 MG capsule Commonly known as:  NEURONTIN Take 300 mg by mouth 3 (three) times daily.   lisinopril 20 MG tablet Commonly known as:  PRINIVIL,ZESTRIL Take 20 mg by mouth daily.   multivitamin with minerals Tabs tablet Take 2 tablets by mouth daily. Notes to patient:  Resume as normal   naproxen 500 MG tablet Commonly known as:  NAPROSYN Take 500 mg by mouth 2 (two) times daily as needed for pain. Notes to patient:  Take as needed   nitroGLYCERIN 0.4 MG SL tablet Commonly known as:  NITROSTAT Place 0.4 mg under the tongue every 5 (five) minutes as needed. Notes to patient:  Take as needed   pantoprazole 20 MG tablet Commonly known as:  PROTONIX Take 20 mg by mouth daily.   potassium chloride 10 MEQ tablet Commonly known as:  K-DUR Take 1 tablet (10 mEq total) by mouth daily. Notes to patient:  Resume as normal   SYMBICORT 80-4.5 MCG/ACT inhaler Generic drug:  budesonide-formoterol Inhale 2 puffs into the lungs 2 (two) times daily. Notes to patient:  Resume as normal   traMADol 50 MG tablet Commonly known as:  ULTRAM Take 1 tablet (50 mg total) by mouth every 6 (six) hours as needed. Do not fill without albuterol and prednisone What changed:  Another medication with the same name was removed. Continue taking this medication, and follow the  directions you see here. Notes to patient:  Take as needed   Vitamin D (Ergocalciferol) 50000 units Caps capsule Commonly known as:  DRISDOL Take 50,000 Units by mouth every Saturday. Notes to patient:  Resume as normal        DISCHARGE INSTRUCTIONS:    Follow up with Dr. Gwen Pounds in next 3-4 days.  If you experience worsening of your admission symptoms, develop shortness of breath, life threatening emergency, suicidal or homicidal thoughts you must seek medical attention immediately by calling 911 or calling your MD immediately  if symptoms less severe.  You Must read complete instructions/literature along with all the possible adverse reactions/side effects for all the Medicines you take and that have been prescribed to you. Take any new Medicines after you have completely understood and accept all the possible adverse reactions/side effects.   Please note  You were cared for by a hospitalist during your hospital stay. If you have any questions about your  discharge medications or the care you received while you were in the hospital after you are discharged, you can call the unit and asked to speak with the hospitalist on call if the hospitalist that took care of you is not available. Once you are discharged, your primary care physician will handle any further medical issues. Please note that NO REFILLS for any discharge medications will be authorized once you are discharged, as it is imperative that you return to your primary care physician (or establish a relationship with a primary care physician if you do not have one) for your aftercare needs so that they can reassess your need for medications and monitor your lab values.    Today   CHIEF COMPLAINT:   Chief Complaint  Patient presents with  . Chest Pain    HISTORY OF PRESENT ILLNESS:  Izetta Barranco  is a 62 y.o. female with a known history of essential hypertension, anxiety, depression, hyperlipidemia, COPD, CAD comes in  because of chest pain on the left side that started this afternoon while she was helping her sister to go to doctor's appointment.  Patient also felt heavy in the legs.  She has multiple psychiatric complaints saying that she is vomiting her urine and also her body is poison with warms and skin infection.  Patient had history of gastritis, anemia with hemoglobin 5.8 last year had EGD which showed NSAID induced gastritis and was told to stop ibuprofen that she was taking for chronic back pain.  Patient has chronic back pain in which she is taking oxycodone for the last 2 days that is prescribed for her sister.  Because of her chest pain, slightly borderline hemoglobin of 7.5 ER physician wanted me to admit the patient.  Patient looks like multiple psychiatric complaints in the chest pain is not her main complaint today.  However because her hemoglobin is 7.5, has chest pain with her history of CAD she will be admitted to overnight observation and get a Lexiscan stress test tomorrow and possible discharge after that.   VITAL SIGNS:  Blood pressure (!) 147/94, pulse 68, temperature 98.1 F (36.7 C), temperature source Oral, resp. rate 17, height 5\' 7"  (1.702 m), weight 63.3 kg, SpO2 97 %.  I/O:  No intake or output data in the 24 hours ending 09/13/18 1038  PHYSICAL EXAMINATION:  GENERAL:  62 y.o.-year-old patient lying in the bed with no acute distress.  EYES: Pupils equal, round, reactive to light and accommodation. No scleral icterus. Extraocular muscles intact.  HEENT: Head atraumatic, normocephalic. Oropharynx and nasopharynx clear.  NECK:  Supple, no jugular venous distention. No thyroid enlargement, no tenderness.  LUNGS: Normal breath sounds bilaterally, no wheezing, rales,rhonchi or crepitation. No use of accessory muscles of respiration.  CARDIOVASCULAR: S1, S2 normal. No murmurs, rubs, or gallops.  ABDOMEN: Soft, non-tender, non-distended. Bowel sounds present. No organomegaly or mass.   EXTREMITIES: No pedal edema, cyanosis, or clubbing.  NEUROLOGIC: Cranial nerves II through XII are intact. Muscle strength 5/5 in all extremities. Sensation intact. Gait not checked.  PSYCHIATRIC: The patient is alert and oriented x 3.  SKIN: No obvious rash, lesion, or ulcer.   DATA REVIEW:   CBC No results for input(s): WBC, HGB, HCT, PLT in the last 168 hours.  Chemistries  No results for input(s): NA, K, CL, CO2, GLUCOSE, BUN, CREATININE, CALCIUM, MG, AST, ALT, ALKPHOS, BILITOT in the last 168 hours.  Invalid input(s): GFRCGP  Cardiac Enzymes No results for input(s): TROPONINI in the last 168  hours.  Microbiology Results  Results for orders placed or performed during the hospital encounter of 04/06/17  Blood Culture (routine x 2)     Status: None   Collection Time: 04/06/17  9:03 PM  Result Value Ref Range Status   Specimen Description BLOOD LEFT ARM  Final   Special Requests Blood Culture adequate volume  Final   Culture NO GROWTH 5 DAYS  Final   Report Status 04/11/2017 FINAL  Final  Blood Culture (routine x 2)     Status: None   Collection Time: 04/06/17  9:03 PM  Result Value Ref Range Status   Specimen Description BLOOD LEFT HAND  Final   Special Requests   Final    BOTTLES DRAWN AEROBIC AND ANAEROBIC Blood Culture results may not be optimal due to an excessive volume of blood received in culture bottles   Culture NO GROWTH 5 DAYS  Final   Report Status 04/11/2017 FINAL  Final  MRSA PCR Screening     Status: None   Collection Time: 04/07/17 12:23 AM  Result Value Ref Range Status   MRSA by PCR NEGATIVE NEGATIVE Final    Comment:        The GeneXpert MRSA Assay (FDA approved for NASAL specimens only), is one component of a comprehensive MRSA colonization surveillance program. It is not intended to diagnose MRSA infection nor to guide or monitor treatment for MRSA infections.   Urine culture     Status: None   Collection Time: 04/07/17  3:24 AM  Result  Value Ref Range Status   Specimen Description URINE, RANDOM  Final   Special Requests NONE  Final   Culture   Final    NO GROWTH Performed at [facility], [address], [place], [place] [address]    Report Status 04/08/2017 FINAL  Final    RADIOLOGY:  No results found.  EKG:   Orders placed or performed during the hospital encounter of 09/03/18  . ED EKG within 10 minutes  . ED EKG within 10 minutes  . EKG      Management plans discussed with the patient, family and they are in agreement.  CODE STATUS:  Code Status History    Date Active Date Inactive Code Status Order ID Comments User Context   09/03/2018 1856 09/05/2018 1659 Full Code [id number]  Katha Hamming, MD ED   10/11/2017 0559 10/12/2017 1348 Full Code [id number]  Arnaldo Natal, MD Inpatient   07/28/2017 1413 07/30/2017 2315 Full Code [id number]  Milagros Loll, MD ED   04/07/2017 0022 04/09/2017 1710 Full Code [id number]  Ihor Austin, MD Inpatient   10/14/2011 2215 10/15/2011 1727 Full Code [id number]  Mays, Joyice Faster, RN Inpatient      TOTAL TIME TAKING CARE OF THIS PATIENT: 35 minutes.    Altamese Dilling M.D on 09/13/2018 at 10:38 AM  Between 7am to 6pm - Pager - [phone]  After 6pm go to [email or web address] - password [email or web address]  [facility]  Office  [phone]  CC: Primary care physician; [name]   Note: This dictation was prepared with Dragon dictation along with smaller phrase technology. Any transcriptional errors that result from this process are unintentional.

## 2018-10-05 ENCOUNTER — Ambulatory Visit: Admission: RE | Admit: 2018-10-05 | Payer: Medicare Other | Source: Ambulatory Visit

## 2018-10-16 ENCOUNTER — Telehealth: Payer: Self-pay

## 2018-10-16 NOTE — Telephone Encounter (Signed)
Call pt regarding lung screening. Left message for pt to return call.  

## 2018-10-20 ENCOUNTER — Telehealth: Payer: Self-pay | Admitting: *Deleted

## 2018-10-20 NOTE — Telephone Encounter (Signed)
Contacted regarding lung screening scan being overdue. Patient is agreeable to having scan tomorrow at 1pm. Will arrange with [facility]

## 2018-10-21 ENCOUNTER — Ambulatory Visit: Admission: RE | Admit: 2018-10-21 | Payer: Medicare Other | Source: Ambulatory Visit

## 2018-11-19 ENCOUNTER — Inpatient Hospital Stay
Admission: EM | Admit: 2018-11-19 | Discharge: 2018-11-21 | DRG: 378 | Disposition: A | Discharge status: Home / Self Care | Payer: Medicare Other | Attending: Internal Medicine | Admitting: Internal Medicine

## 2018-11-19 ENCOUNTER — Other Ambulatory Visit: Payer: Self-pay

## 2018-11-19 ENCOUNTER — Encounter: Payer: Self-pay | Admitting: Emergency Medicine

## 2018-11-19 DIAGNOSIS — Z23 Encounter for immunization: Secondary | ICD-10-CM

## 2018-11-19 DIAGNOSIS — E785 Hyperlipidemia, unspecified: Secondary | ICD-10-CM | POA: Diagnosis present

## 2018-11-19 DIAGNOSIS — I252 Old myocardial infarction: Secondary | ICD-10-CM

## 2018-11-19 DIAGNOSIS — F419 Anxiety disorder, unspecified: Secondary | ICD-10-CM | POA: Diagnosis present

## 2018-11-19 DIAGNOSIS — D509 Iron deficiency anemia, unspecified: Secondary | ICD-10-CM | POA: Diagnosis present

## 2018-11-19 DIAGNOSIS — Z86010 Personal history of colonic polyps: Secondary | ICD-10-CM

## 2018-11-19 DIAGNOSIS — G629 Polyneuropathy, unspecified: Secondary | ICD-10-CM | POA: Diagnosis present

## 2018-11-19 DIAGNOSIS — Z79899 Other long term (current) drug therapy: Secondary | ICD-10-CM | POA: Diagnosis not present

## 2018-11-19 DIAGNOSIS — K219 Gastro-esophageal reflux disease without esophagitis: Secondary | ICD-10-CM | POA: Diagnosis present

## 2018-11-19 DIAGNOSIS — I2510 Atherosclerotic heart disease of native coronary artery without angina pectoris: Secondary | ICD-10-CM | POA: Diagnosis present

## 2018-11-19 DIAGNOSIS — G2581 Restless legs syndrome: Secondary | ICD-10-CM | POA: Diagnosis present

## 2018-11-19 DIAGNOSIS — K625 Hemorrhage of anus and rectum: Secondary | ICD-10-CM | POA: Diagnosis present

## 2018-11-19 DIAGNOSIS — D62 Acute posthemorrhagic anemia: Secondary | ICD-10-CM | POA: Diagnosis present

## 2018-11-19 DIAGNOSIS — Z888 Allergy status to other drugs, medicaments and biological substances status: Secondary | ICD-10-CM

## 2018-11-19 DIAGNOSIS — I10 Essential (primary) hypertension: Secondary | ICD-10-CM | POA: Diagnosis present

## 2018-11-19 DIAGNOSIS — F17210 Nicotine dependence, cigarettes, uncomplicated: Secondary | ICD-10-CM | POA: Diagnosis present

## 2018-11-19 DIAGNOSIS — J449 Chronic obstructive pulmonary disease, unspecified: Secondary | ICD-10-CM | POA: Diagnosis present

## 2018-11-19 DIAGNOSIS — Z8249 Family history of ischemic heart disease and other diseases of the circulatory system: Secondary | ICD-10-CM

## 2018-11-19 DIAGNOSIS — Z7951 Long term (current) use of inhaled steroids: Secondary | ICD-10-CM

## 2018-11-19 DIAGNOSIS — D649 Anemia, unspecified: Secondary | ICD-10-CM

## 2018-11-19 DIAGNOSIS — I739 Peripheral vascular disease, unspecified: Secondary | ICD-10-CM | POA: Diagnosis present

## 2018-11-19 DIAGNOSIS — F329 Major depressive disorder, single episode, unspecified: Secondary | ICD-10-CM | POA: Diagnosis present

## 2018-11-19 DIAGNOSIS — K922 Gastrointestinal hemorrhage, unspecified: Secondary | ICD-10-CM | POA: Diagnosis present

## 2018-11-19 DIAGNOSIS — Z7902 Long term (current) use of antithrombotics/antiplatelets: Secondary | ICD-10-CM | POA: Diagnosis not present

## 2018-11-19 LAB — COMPREHENSIVE METABOLIC PANEL
ALBUMIN: 4 g/dL (ref 3.5–5.0)
ALT: 11 U/L (ref 0–44)
ANION GAP: 8 (ref 5–15)
AST: 18 U/L (ref 15–41)
Alkaline Phosphatase: 99 U/L (ref 38–126)
BUN: 9 mg/dL (ref 8–23)
CO2: 22 mmol/L (ref 22–32)
Calcium: 8.9 mg/dL (ref 8.9–10.3)
Chloride: 107 mmol/L (ref 98–111)
Creatinine, Ser: 0.78 mg/dL (ref 0.44–1.00)
GFR calc non Af Amer: >60 mL/min (ref >60)
GLUCOSE: 105 mg/dL — AB (ref 70–99)
Potassium: 3.6 mmol/L (ref 3.5–5.1)
SODIUM: 137 mmol/L (ref 135–145)
Total Bilirubin: 0.4 mg/dL (ref 0.3–1.2)
Total Protein: 7.2 g/dL (ref 6.5–8.1)

## 2018-11-19 LAB — CBC
HCT: 26.3 % — ABNORMAL LOW (ref 36.0–46.0)
Hemoglobin: 7.2 g/dL — ABNORMAL LOW (ref 12.0–15.0)
MCH: 19.5 pg — AB (ref 26.0–34.0)
MCHC: 27.4 g/dL — ABNORMAL LOW (ref 30.0–36.0)
MCV: 71.1 fL — AB (ref 80.0–100.0)
Platelets: 309 10*3/uL (ref 150–400)
RBC: 3.7 MIL/uL — ABNORMAL LOW (ref 3.87–5.11)
RDW: 21.1 % — ABNORMAL HIGH (ref 11.5–15.5)
WBC: 7 10*3/uL (ref 4.0–10.5)
nRBC: 0 % (ref 0.0–0.2)

## 2018-11-19 LAB — PROTIME-INR
INR: 1.05
PROTHROMBIN TIME: 13.6 s (ref 11.4–15.2)

## 2018-11-19 LAB — APTT: aPTT: 36 seconds (ref 24–36)

## 2018-11-19 LAB — HEMOGLOBIN: Hemoglobin: 7.5 g/dL — ABNORMAL LOW (ref 12.0–15.0)

## 2018-11-19 MED ORDER — ACETAMINOPHEN 650 MG RE SUPP: 650.0000 mg | Freq: Four times a day (QID) | RECTAL | Status: DC | PRN

## 2018-11-19 MED ORDER — ONDANSETRON HCL 4 MG/2ML IJ SOLN
4.0000 mg | Freq: Once | INTRAMUSCULAR | Status: AC
  Administered 2018-11-19: 4 mg via INTRAVENOUS
  Filled 2018-11-19: qty 2

## 2018-11-19 MED ORDER — SODIUM CHLORIDE 0.9 % IV SOLN
Freq: Once | INTRAVENOUS | Status: AC
  Administered 2018-11-19: 19:00:00 via INTRAVENOUS

## 2018-11-19 MED ORDER — ALBUTEROL SULFATE (2.5 MG/3ML) 0.083% IN NEBU: 2.5000 mg | INHALATION_SOLUTION | Freq: Four times a day (QID) | RESPIRATORY_TRACT | Status: DC | PRN

## 2018-11-19 MED ORDER — ALPRAZOLAM 0.5 MG PO TABS
2.0000 mg | ORAL_TABLET | Freq: Two times a day (BID) | ORAL | Status: DC
  Administered 2018-11-19 – 2018-11-21 (×4): 2 mg via ORAL
  Filled 2018-11-19 (×4): qty 4

## 2018-11-19 MED ORDER — POLYETHYLENE GLYCOL 3350 17 G PO PACK: 17.0000 g | PACK | Freq: Every day | ORAL | Status: DC | PRN

## 2018-11-19 MED ORDER — PREGABALIN 75 MG PO CAPS
150.0000 mg | ORAL_CAPSULE | Freq: Two times a day (BID) | ORAL | Status: DC
  Administered 2018-11-19 – 2018-11-21 (×4): 150 mg via ORAL
  Filled 2018-11-19 (×4): qty 2

## 2018-11-19 MED ORDER — OXYCODONE-ACETAMINOPHEN 5-325 MG PO TABS
1.0000 | ORAL_TABLET | ORAL | Status: AC
  Administered 2018-11-19: 1 via ORAL
  Filled 2018-11-19: qty 1

## 2018-11-19 MED ORDER — ATORVASTATIN CALCIUM 20 MG PO TABS
80.0000 mg | ORAL_TABLET | Freq: Every day | ORAL | Status: DC
  Administered 2018-11-20 – 2018-11-21 (×2): 80 mg via ORAL
  Filled 2018-11-19 (×2): qty 4

## 2018-11-19 MED ORDER — ADULT MULTIVITAMIN W/MINERALS CH
2.0000 | ORAL_TABLET | Freq: Every day | ORAL | Status: DC
  Administered 2018-11-20 – 2018-11-21 (×2): 2 via ORAL
  Filled 2018-11-19 (×2): qty 2

## 2018-11-19 MED ORDER — CITALOPRAM HYDROBROMIDE 20 MG PO TABS
40.0000 mg | ORAL_TABLET | Freq: Every day | ORAL | Status: DC
  Administered 2018-11-20 – 2018-11-21 (×2): 40 mg via ORAL
  Filled 2018-11-19 (×2): qty 2

## 2018-11-19 MED ORDER — NITROGLYCERIN 0.4 MG SL SUBL: 0.4000 mg | SUBLINGUAL_TABLET | SUBLINGUAL | Status: DC | PRN

## 2018-11-19 MED ORDER — TRAMADOL HCL 50 MG PO TABS
50.0000 mg | ORAL_TABLET | Freq: Three times a day (TID) | ORAL | Status: DC | PRN
  Administered 2018-11-20: 50 mg via ORAL
  Filled 2018-11-19: qty 1

## 2018-11-19 MED ORDER — MOMETASONE FURO-FORMOTEROL FUM 100-5 MCG/ACT IN AERO
2.0000 | INHALATION_SPRAY | Freq: Two times a day (BID) | RESPIRATORY_TRACT | Status: DC
  Administered 2018-11-19 – 2018-11-21 (×4): 2 via RESPIRATORY_TRACT
  Filled 2018-11-19: qty 8.8

## 2018-11-19 MED ORDER — INFLUENZA VAC SPLIT QUAD 0.5 ML IM SUSY
0.5000 mL | PREFILLED_SYRINGE | INTRAMUSCULAR | Status: AC
  Administered 2018-11-20: 0.5 mL via INTRAMUSCULAR
  Filled 2018-11-19: qty 0.5

## 2018-11-19 MED ORDER — ONDANSETRON HCL 4 MG/2ML IJ SOLN: 4.0000 mg | Freq: Four times a day (QID) | INTRAMUSCULAR | Status: DC | PRN

## 2018-11-19 MED ORDER — ACETAMINOPHEN 325 MG PO TABS
650.0000 mg | ORAL_TABLET | Freq: Four times a day (QID) | ORAL | Status: DC | PRN
  Administered 2018-11-20: 650 mg via ORAL
  Filled 2018-11-19: qty 2

## 2018-11-19 MED ORDER — GABAPENTIN 300 MG PO CAPS
300.0000 mg | ORAL_CAPSULE | Freq: Three times a day (TID) | ORAL | Status: DC
  Administered 2018-11-19 – 2018-11-21 (×6): 300 mg via ORAL
  Filled 2018-11-19 (×6): qty 1

## 2018-11-19 MED ORDER — LISINOPRIL 20 MG PO TABS
20.0000 mg | ORAL_TABLET | Freq: Every day | ORAL | Status: DC
  Administered 2018-11-20 – 2018-11-21 (×2): 20 mg via ORAL
  Filled 2018-11-19 (×2): qty 1

## 2018-11-19 MED ORDER — DULOXETINE HCL 30 MG PO CPEP
30.0000 mg | ORAL_CAPSULE | Freq: Every day | ORAL | Status: DC
  Administered 2018-11-20 – 2018-11-21 (×2): 30 mg via ORAL
  Filled 2018-11-19 (×2): qty 1

## 2018-11-19 MED ORDER — CARVEDILOL 6.25 MG PO TABS
6.2500 mg | ORAL_TABLET | Freq: Two times a day (BID) | ORAL | Status: DC
  Administered 2018-11-19 – 2018-11-21 (×4): 6.25 mg via ORAL
  Filled 2018-11-19 (×4): qty 1

## 2018-11-19 MED ORDER — BUSPIRONE HCL 10 MG PO TABS
10.0000 mg | ORAL_TABLET | Freq: Two times a day (BID) | ORAL | Status: DC
  Administered 2018-11-19 – 2018-11-21 (×4): 10 mg via ORAL
  Filled 2018-11-19 (×5): qty 1

## 2018-11-19 MED ORDER — PANTOPRAZOLE SODIUM 20 MG PO TBEC
20.0000 mg | DELAYED_RELEASE_TABLET | Freq: Every day | ORAL | Status: DC
  Administered 2018-11-20 – 2018-11-21 (×2): 20 mg via ORAL
  Filled 2018-11-19 (×2): qty 1

## 2018-11-19 MED ORDER — LORATADINE 10 MG PO TABS
10.0000 mg | ORAL_TABLET | Freq: Every day | ORAL | Status: DC
  Administered 2018-11-20 – 2018-11-21 (×2): 10 mg via ORAL
  Filled 2018-11-19 (×2): qty 1

## 2018-11-19 MED ORDER — ONDANSETRON HCL 4 MG PO TABS: 4.0000 mg | ORAL_TABLET | Freq: Four times a day (QID) | ORAL | Status: DC | PRN

## 2018-11-19 MED ORDER — SODIUM CHLORIDE 0.9 % IV BOLUS
500.0000 mL | Freq: Once | INTRAVENOUS | Status: AC
  Administered 2018-11-19: 500 mL via INTRAVENOUS

## 2018-11-19 NOTE — Progress Notes (Signed)
Family Meeting Note  Advance Directive yes Today a meeting took place with the  With patient  Patient came in with rectal bleed. She has a history of hemorrhoids. Colonoscopy done in 2018 showed sigmoid diverticulosis. Hemoglobin is 7.2. She has chronic medical problems with CAD and peripheral arterial disease. Will hold aspirin Plavix. Court status discussed with patient she wants her beautiful cold. No family in the ER. Time spent during discussion 16 mins  Fritzi Mandes, MD

## 2018-11-19 NOTE — ED Provider Notes (Signed)
[facility] Emergency Department Provider Note   ____________________________________________   First MD Initiated Contact with Patient 11/19/18 1654     (approximate)  I have reviewed the triage vital signs and the nursing notes.   HISTORY  Chief Complaint Abnormal Lab    HPI Traci Duran is a 62 y.o. female reports a history of polyps.  Patient reports that she did feel fatigued for the last couple days, and saw her primary care doctor and they drew her labs and called her notifying her that her blood count was low.  Advised to come to the ER.  She does recall 1 event yesterday where she had a bowel movement that looked bloody.  Otherwise doing well.  No vomiting no abdominal pain.  No chest pain or trouble breathing.  She does feel a little bit winded however when she is walking for the last couple of days as well  She does take Plavix but denies taking any other stronger blood thinners.   Past Medical History:  Diagnosis Date  . Anemia   . Anxiety   . COPD (chronic obstructive pulmonary disease) ([place])   . Coronary artery disease   . Depression   . Gastritis   . GERD (gastroesophageal reflux disease)   . Hemorrhoids   . Hyperlipidemia   . Hypertension   . PAD (peripheral artery disease) [facility])     Patient Active Problem List   Diagnosis Date Noted  . Rectal bleed 11/19/2018  . 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 Benzodiazepine abuse ([place]) 09/03/2018  . Moderate recurrent major depression ([place]) 09/03/2018  . Hyponatremia 10/11/2017  . Chest pain 07/28/2017  . Acute blood loss anemia 07/28/2017  . Demand ischemia ([place])   . Benign neoplasm of descending colon   . Polyp of sigmoid colon   . Age related osteoporosis 04/16/2017  . Iron deficiency anemia secondary to blood loss (chronic)   . GI bleed 04/06/2017  . Diastolic dysfunction 37/16/9678  . Opioid contract exists 06/27/2016  . Incidental lung nodule 01/18/2016    . Traumatic hemorrhage of cerebrum ([place]) 12/04/2015  . Hypovitaminosis D 08/29/2015  . Thyroid lesion 08/29/2015  . Major depressive disorder, single episode, unspecified 08/27/2015  . 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 Gastroesophageal reflux disease 01/26/2015  . Osteopenia 01/26/2015  . Anemia, unspecified 12/27/2014  . History of fall 12/27/2014  . Greater tuberosity of humerus fracture 10/24/2013  . AVF (arteriovenous fistula) ([place]) 11/08/2011  . Anxiety 10/24/2011  . Cerebrovascular disease 10/24/2011  . Dyslipidemia 10/24/2011  . Hypertension 10/24/2011  . Tobacco abuse 10/24/2011  . Palpitations 10/24/2011  . Renal artery stenosis ([place]) 10/24/2011  . Unstable angina ([place]) 10/14/2011  . Hyperlipidemia   . Chronic obstructive pulmonary disease ([place])   . Depression   . Peripheral arterial disease ([place])   . Coronary artery disease, non-occlusive     Past Surgical History:  Procedure Laterality Date  . APPENDECTOMY    . COLONOSCOPY WITH PROPOFOL N/A 06/09/2017   Procedure: COLONOSCOPY WITH PROPOFOL;  Surgeon: Lucilla Lame, MD;  Location[facility] ENDOSCOPY;  Service: Endoscopy;  Laterality: N/A;  . ESOPHAGOGASTRODUODENOSCOPY N/A 07/30/2017   Procedure: ESOPHAGOGASTRODUODENOSCOPY (EGD);  Surgeon: Lin Landsman, MD;  Location[facility] ENDOSCOPY;  Service: Gastroenterology;  Laterality: N/A;  . ESOPHAGOGASTRODUODENOSCOPY (EGD) WITH PROPOFOL N/A 04/09/2017   Procedure: ESOPHAGOGASTRODUODENOSCOPY (EGD) WITH PROPOFOL;  Surgeon: Lucilla Lame, MD;  Location: [facility] ENDOSCOPY;  Service: Endoscopy;  Laterality: N/A;  . HIP FRACTURE SURGERY  Prior to Admission medications   Medication Sig Start Date End Date Taking? Authorizing Provider  albuterol (PROVENTIL HFA;VENTOLIN HFA) 108 (90 Base) MCG/ACT inhaler Inhale 2 puffs into the lungs every 6 (six) hours as needed for wheezing or shortness of breath. 04/02/18  Yes Merlyn Lot, MD  alprazolam Duanne Moron) 2 MG tablet Take 2 mg  by mouth 2 (two) times daily.    Yes [provider]  atorvastatin (LIPITOR) 80 MG tablet Take 80 mg by mouth daily.   Yes [provider]  busPIRone (BUSPAR) 10 MG tablet Take 10 mg by mouth 2 (two) times daily.    Yes [provider]  carvedilol (COREG) 6.25 MG tablet Take 6.25 mg by mouth 2 (two) times daily. 05/28/17  Yes [provider]  cetirizine (ZYRTEC) 10 MG tablet Take 10 mg by mouth daily. 08/12/18  Yes [provider]  citalopram (CELEXA) 40 MG tablet Take 40 mg by mouth daily.   Yes [provider]  clopidogrel (PLAVIX) 75 MG tablet Take 75 mg by mouth daily.   Yes [provider]  DULoxetine (CYMBALTA) 30 MG capsule Take 30 mg by mouth daily. 05/28/17  Yes [provider]  lisinopril (PRINIVIL,ZESTRIL) 20 MG tablet Take 20 mg by mouth daily.     Yes [provider]  Multiple Vitamin (MULTIVITAMIN WITH MINERALS) TABS tablet Take 2 tablets by mouth daily.   Yes [provider]  nitroGLYCERIN (NITROSTAT) 0.4 MG SL tablet Place 0.4 mg under the tongue every 5 (five) minutes as needed.    Yes [provider]  pantoprazole (PROTONIX) 20 MG tablet Take 20 mg by mouth daily.   Yes [provider]  pregabalin (LYRICA) 150 MG capsule Take 150 mg by mouth 2 (two) times daily. 11/18/18 11/18/19 Yes [provider]  SYMBICORT 80-4.5 MCG/ACT inhaler Inhale 2 puffs into the lungs 2 (two) times daily. 08/12/18  Yes [provider]  Vitamin D, Ergocalciferol, (DRISDOL) 50000 units CAPS capsule Take 50,000 Units by mouth every Saturday.  05/01/17  Yes [provider]  betamethasone valerate ointment (VALISONE) 0.1 % Apply 1 application topically 2 (two) times daily. 08/19/18   [provider]  ferrous sulfate 325 (65 FE) MG tablet Take 1 tablet (325 mg total) by mouth 3 (three) times daily with meals. Patient not taking: Reported on 11/19/2018 07/30/17   Fritzi Mandes, MD   gabapentin (NEURONTIN) 300 MG capsule Take 300 mg by mouth 3 (three) times daily.     [provider]  naproxen (NAPROSYN) 500 MG tablet Take 500 mg by mouth 2 (two) times daily as needed for pain. 08/17/18   [provider]  potassium chloride (K-DUR) 10 MEQ tablet Take 1 tablet (10 mEq total) by mouth daily. Patient not taking: Reported on 09/04/2018 05/28/17   Earleen Newport, MD    Allergies Chantix [varenicline]; Diphenhydramine hcl; Benadryl [diphenhydramine hcl]; Niacin; and Trazodone  Family History  Problem Relation Age of Onset  . Heart failure Mother   . Heart attack Mother        mother died at 53 of an MI  . Heart attack Father        father died at 62 of an MI  . Heart attack Brother        Brother had an MI in his 39s    Social History Social History   Tobacco Use  . Smoking status: Current Some Day Smoker    Packs/day: 1.00    Years:  46.00    Pack years: 46.00    Types: Cigarettes  . Smokeless tobacco: Never Used  . Tobacco comment: she feels that the 21 nicotine made her jittery and we suggested a lower level patch  Substance Use Topics  . Alcohol use: No  . Drug use: Yes    Types: Marijuana    Comment: monthly    Review of Systems Constitutional: No fever/chills and some fatigue ENT: No sore throat. Cardiovascular: Denies chest pain. Respiratory: Denies shortness of breath. Gastrointestinal: No abdominal pain.  1 loose slightly bloody stool. Genitourinary: Negative for dysuria. Musculoskeletal: Negative for back pain. Skin: Negative for rash. Neurological: Negative for headaches.    ____________________________________________   PHYSICAL EXAM:  VITAL SIGNS: ED Triage Vitals  Enc Vitals Group     BP 11/19/18 1453 (!) 158/76     Pulse Rate 11/19/18 1453 79     Resp 11/19/18 1453 16     Temp 11/19/18 1453 98.4 F (36.9 C)     Temp Source 11/19/18 1453 Oral     SpO2 11/19/18 1453 98 %     Weight 11/19/18 1454 138 lb  (62.6 kg)     Height 11/19/18 1454 5\' 7"  (1.702 m)     Head Circumference --      Peak Flow --      Pain Score 11/19/18 1453 10     Pain Loc --      Pain Edu? --      Excl. in [place]? --     Constitutional: Alert and oriented. Well appearing and in no acute distress. Eyes: Conjunctivae are normal. Head: Atraumatic. Nose: No congestion/rhinnorhea. Mouth/Throat: Mucous membranes are moist. Neck: No stridor.  Cardiovascular: Normal rate, regular rhythm. Grossly normal heart sounds.  Good peripheral circulation. Respiratory: Normal respiratory effort.  No retractions. Lungs CTAB. Gastrointestinal: Soft and nontender. No distention. Musculoskeletal: No lower extremity tenderness nor edema. Neurologic:  Normal speech and language. No gross focal neurologic deficits are appreciated.  Skin:  Skin is warm, dry and intact. No rash noted. Psychiatric: Mood and affect are normal. Speech and behavior are normal.  ____________________________________________   LABS (all labs ordered are listed, but only abnormal results are displayed)  Labs Reviewed  COMPREHENSIVE METABOLIC PANEL - Abnormal; Notable for the following components:      Result Value   Glucose, Bld 105 (*)    All other components within normal limits  CBC - Abnormal; Notable for the following components:   RBC 3.70 (*)    Hemoglobin 7.2 (*)    HCT 26.3 (*)    MCV 71.1 (*)    MCH 19.5 (*)    MCHC 27.4 (*)    RDW 21.1 (*)    All other components within normal limits  HEMOGLOBIN - Abnormal; Notable for the following components:   Hemoglobin 7.5 (*)    All other components within normal limits  PROTIME-INR  APTT  HEMOGLOBIN  POC OCCULT BLOOD, ED  TYPE AND SCREEN   ____________________________________________  EKG   ____________________________________________  RADIOLOGY   ____________________________________________   PROCEDURES  Procedure(s) performed: None  Procedures  Critical Care performed:  No  ____________________________________________   INITIAL IMPRESSION / ASSESSMENT AND PLAN / ED COURSE  Pertinent labs & imaging results that were available during my care of the patient were reviewed by me and considered in my medical decision making (see chart for details).   Patient presents for somewhat acute onset of anemia.  Associated with fatigue and also 1  grossly bloody bowel movement yesterday.  No further bloody stool and her hemoglobin is stable from the previous check.  However, given her symptomatology I would be concerned for GI bleeding and the potential for worsening.  Patient is in agreement we will observe her tonight and consult gastroenterology.  Discussed case with the hospitalist.  No associated cardiac pulmonary or intra-abdominal symptoms such as abdominal pain.  Very reassuring examination without evidence of acute abdomen or acute intra-abdominal process aside from concern for GI bleed at this time.      ____________________________________________   FINAL CLINICAL IMPRESSION(S) / ED DIAGNOSES  Final diagnoses:  Lower GI bleed  Symptomatic anemia        Note:  This document was prepared using Dragon voice recognition software and may include unintentional dictation errors       Delman Kitten, MD 11/19/18 2126

## 2018-11-19 NOTE — ED Triage Notes (Signed)
First Nurse Note:  States PCP ([facility]) drew blood work yesterday and called her today to come to ED.  Looked through [employer], noticed H/H of 7.3 / 27.7.  Patient is AAOx3.  Skin warm and dry.  No SOB/ DOE noted.

## 2018-11-19 NOTE — ED Triage Notes (Addendum)
Here for abnormal labs.  Pt was sent by PCP for low hgb.  Reports bloody stool yesterday X 1 but none today.  asymptomatic otherwise.  Went to PCP for bilateral leg pain yesterday and was told today to come to ED.  Is on plavix and ASA.  Pt denies feeling bad other than having leg pain.  VSS in triage unlabored.  Doctor restarted pts lyrica for leg pain

## 2018-11-19 NOTE — ED Notes (Signed)
Pt to ED for bilateral lower abdominal pain and bright red blood in her stools since yesterday. Pt also reports that she has had nausea as well. Pt states that her stools are normal in consistency but have blood mixed in them.

## 2018-11-19 NOTE — H&P (Addendum)
[facility] [place] at [facility]   PATIENT NAME: Traci Duran    MR#:  [id number]  DATE OF BIRTH:  1956/07/12  DATE OF ADMISSION:  11/19/2018  PRIMARY CARE PHYSICIAN: [place], [facility]   REQUESTING/REFERRING PHYSICIAN: Dr. Fanny Bien  CHIEF COMPLAINT:  Rectal bleed x1 today  HISTORY OF PRESENT ILLNESS:  Traci Duran  is a 62 y.o. female with a known history of peripheral vascular disease/coronary artery disease on aspirin and Plavix, history of Genella Rife, hypertension comes to the emergency room after she saw some blood in her commode. Patient said this likely is about spoonful. She does not know if it has been happening every day however she just happened to see it today. Went to her primary care physician. Hemoglobin is 7.2 comes to the emergency room. She is hemodynamically stable. She felt a little dizzy and lightheaded earlier.  No rectal bleeding in the ER. Denies any abdominal pain chest pain or shortness of breath.  Patient's baseline hemoglobin is around 9.2. She had EGD and colonoscopy last year. She is being admitted for rectal bleed.  PAST MEDICAL HISTORY:   Past Medical History:  Diagnosis Date  . Anemia   . Anxiety   . COPD (chronic obstructive pulmonary disease) ([facility])   . Coronary artery disease   . Depression   . Gastritis   . GERD (gastroesophageal reflux disease)   . Hemorrhoids   . Hyperlipidemia   . Hypertension   . PAD (peripheral artery disease) ([facility])     PAST SURGICAL HISTOIRY:   Past Surgical History:  Procedure Laterality Date  . APPENDECTOMY    . COLONOSCOPY WITH PROPOFOL N/A 06/09/2017   Procedure: COLONOSCOPY WITH PROPOFOL;  Surgeon: Midge Minium, MD;  Location[facility] ENDOSCOPY;  Service: Endoscopy;  Laterality: N/A;  . ESOPHAGOGASTRODUODENOSCOPY N/A 07/30/2017   Procedure: ESOPHAGOGASTRODUODENOSCOPY (EGD);  Surgeon: Toney Reil, MD;  Location: [facility] ENDOSCOPY;  Service: Gastroenterology;  Laterality: N/A;   . ESOPHAGOGASTRODUODENOSCOPY (EGD) WITH PROPOFOL N/A 04/09/2017   Procedure: ESOPHAGOGASTRODUODENOSCOPY (EGD) WITH PROPOFOL;  Surgeon: Midge Minium, MD;  Location: [facility] ENDOSCOPY;  Service: Endoscopy;  Laterality: N/A;  . HIP FRACTURE SURGERY      SOCIAL HISTORY:   Social History   Tobacco Use  . Smoking status: Current Some Day Smoker    Packs/day: 1.00    Years: 46.00    Pack years: 46.00    Types: Cigarettes  . Smokeless tobacco: Never Used  . Tobacco comment: she feels that the 21 nicotine made her jittery and we suggested a lower level patch  Substance Use Topics  . Alcohol use: No    FAMILY HISTORY:   Family History  Problem Relation Age of Onset  . Heart failure Mother   . Heart attack Mother        mother died at 107 of an MI  . Heart attack Father        father died at 69 of an MI  . Heart attack Brother        Brother had an MI in his 33s    DRUG ALLERGIES:   Allergies  Allergen Reactions  . Chantix [Varenicline] Hives, Shortness Of Breath and Palpitations  . Diphenhydramine Hcl Shortness Of Breath  . Benadryl [Diphenhydramine Hcl] Rash  . Niacin Rash  . Trazodone Palpitations    REVIEW OF SYSTEMS:  Review of Systems  Constitutional: Negative for chills, fever and weight loss.  HENT: Negative for ear discharge, ear pain and nosebleeds.  Eyes: Negative for blurred vision, pain and discharge.  Respiratory: Negative for sputum production, shortness of breath, wheezing and stridor.   Cardiovascular: Negative for chest pain, palpitations, orthopnea and PND.  Gastrointestinal: Positive for blood in stool. Negative for abdominal pain, diarrhea, nausea and vomiting.  Genitourinary: Negative for frequency and urgency.  Musculoskeletal: Negative for back pain and joint pain.  Neurological: Negative for sensory change, speech change, focal weakness and weakness.  Psychiatric/Behavioral: Negative for depression and hallucinations. The patient is not  nervous/anxious.      MEDICATIONS AT HOME:   Prior to Admission medications   Medication Sig Start Date End Date Taking? Authorizing Provider  albuterol (PROVENTIL HFA;VENTOLIN HFA) 108 (90 Base) MCG/ACT inhaler Inhale 2 puffs into the lungs every 6 (six) hours as needed for wheezing or shortness of breath. 04/02/18  Yes Willy Eddy, MD  alprazolam Prudy Feeler) 2 MG tablet Take 2 mg by mouth 2 (two) times daily.    Yes [provider]  atorvastatin (LIPITOR) 80 MG tablet Take 80 mg by mouth daily.   Yes [provider]  busPIRone (BUSPAR) 10 MG tablet Take 10 mg by mouth 2 (two) times daily.    Yes [provider]  carvedilol (COREG) 6.25 MG tablet Take 6.25 mg by mouth 2 (two) times daily. 05/28/17  Yes [provider]  cetirizine (ZYRTEC) 10 MG tablet Take 10 mg by mouth daily. 08/12/18  Yes [provider]  citalopram (CELEXA) 40 MG tablet Take 40 mg by mouth daily.   Yes [provider]  clopidogrel (PLAVIX) 75 MG tablet Take 75 mg by mouth daily.   Yes [provider]  DULoxetine (CYMBALTA) 30 MG capsule Take 30 mg by mouth daily. 05/28/17  Yes [provider]  lisinopril (PRINIVIL,ZESTRIL) 20 MG tablet Take 20 mg by mouth daily.     Yes [provider]  Multiple Vitamin (MULTIVITAMIN WITH MINERALS) TABS tablet Take 2 tablets by mouth daily.   Yes [provider]  nitroGLYCERIN (NITROSTAT) 0.4 MG SL tablet Place 0.4 mg under the tongue every 5 (five) minutes as needed.    Yes [provider]  pantoprazole (PROTONIX) 20 MG tablet Take 20 mg by mouth daily.   Yes [provider]  pregabalin (LYRICA) 150 MG capsule Take 150 mg by mouth 2 (two) times daily. 11/18/18 11/18/19 Yes [provider]  SYMBICORT 80-4.5 MCG/ACT inhaler Inhale 2 puffs into the lungs 2 (two) times daily. 08/12/18  Yes [provider]  Vitamin D, Ergocalciferol, (DRISDOL) 50000 units CAPS capsule Take  50,000 Units by mouth every Saturday.  05/01/17  Yes [provider]  betamethasone valerate ointment (VALISONE) 0.1 % Apply 1 application topically 2 (two) times daily. 08/19/18   [provider]  ferrous sulfate 325 (65 FE) MG tablet Take 1 tablet (325 mg total) by mouth 3 (three) times daily with meals. Patient not taking: Reported on 11/19/2018 07/30/17   Enedina Finner, MD  gabapentin (NEURONTIN) 300 MG capsule Take 300 mg by mouth 3 (three) times daily.     [provider]  naproxen (NAPROSYN) 500 MG tablet Take 500 mg by mouth 2 (two) times daily as needed for pain. 08/17/18   [provider]  potassium chloride (K-DUR) 10 MEQ tablet Take 1 tablet (10 mEq total) by mouth daily. Patient not taking: Reported on 09/04/2018 05/28/17   Emily Filbert, MD  traMADol (ULTRAM) 50 MG tablet Take 1 tablet (50 mg total) by mouth every 6 (six) hours  as needed. Do not fill without albuterol and prednisone Patient not taking: Reported on 09/04/2018 04/02/18 04/02/19  Willy Eddy, MD      VITAL SIGNS:  Blood pressure (!) 158/76, pulse 79, temperature 98.4 F (36.9 C), temperature source Oral, resp. rate 16, height 5\' 7"  (1.702 m), weight 62.6 kg, SpO2 98 %.  PHYSICAL EXAMINATION:  GENERAL:  62 y.o.-year-old patient lying in the bed with no acute distress.  EYES: Pupils equal, round, reactive to light and accommodation. No scleral icterus. Extraocular muscles intact.  HEENT: Head atraumatic, normocephalic. Oropharynx and nasopharynx clear.  NECK:  Supple, no jugular venous distention. No thyroid enlargement, no tenderness.  LUNGS: Normal breath sounds bilaterally, no wheezing, rales,rhonchi or crepitation. No use of accessory muscles of respiration.  CARDIOVASCULAR: S1, S2 normal. No murmurs, rubs, or gallops.  ABDOMEN: Soft, nontender, nondistended. Bowel sounds present. No organomegaly or mass.  EXTREMITIES: No pedal edema, cyanosis, or clubbing.  NEUROLOGIC:  Cranial nerves II through XII are intact. Muscle strength 5/5 in all extremities. Sensation intact. Gait not checked.  PSYCHIATRIC: The patient is alert and oriented x 3.  SKIN: No obvious rash, lesion, or ulcer.   LABORATORY PANEL:   CBC Recent Labs  Lab 11/19/18 1502  WBC 7.0  HGB 7.2*  HCT 26.3*  PLT 309   ------------------------------------------------------------------------------------------------------------------  Chemistries  Recent Labs  Lab 11/19/18 1502  NA 137  K 3.6  CL 107  CO2 22  GLUCOSE 105*  BUN 9  CREATININE 0.78  CALCIUM 8.9  AST 18  ALT 11  ALKPHOS 99  BILITOT 0.4   ------------------------------------------------------------------------------------------------------------------  Cardiac Enzymes No results for input(s): TROPONINI in the last 168 hours. ------------------------------------------------------------------------------------------------------------------  RADIOLOGY:  No results found.  EKG:    IMPRESSION AND PLAN:   Traci Duran  is a 62 y.o. female with a known history of peripheral vascular disease/coronary artery disease on aspirin and Plavix, history of Genella Rife, hypertension comes to the emergency room after she saw some blood in her commode. Patient said this likely is about spoonful. She does not know if it has been happening every day however she just happened to see it today.  1. Lower rectal bleed suspected hemorrhoidal vs diverticular -patient has history of hemorrhoids and diverticulosis -denies constipation -rule out other causes of red G.I. bleed. Consultation with Dr. Tobi Bastos secure chat message sent. -Currently not actively bleeding. -Hemoglobin check you to allow early. Transfuse if hemoglobin less than seven -patient started on iron pills by her primary care physician  -EGD in 2018 showedNormal duodenal bulb, second portion of the duodenum and third portion of the duodenum. Biopsied. - Non-bleeding erosive  gastropathy. - Erythematous mucosa in the gastric body. Biopsied. - Z-line irregular. - Small hiatal hernia. - Normal esophagus. -Colonoscopy in 2018 showed One 8 mm polyp in the descending colon, removed with a hot snare. Resected and retrieved. - Four 3 to 6 mm polyps in the sigmoid colon, removed with a cold snare. Resected and retrieved. - Diverticulosis in the sigmoid colon.  -Hold aspirin Plavix  2. Hypertension -continue Coreg, lisinopril  3. Hyperlipidemia continue Lipitor  4. History of chronic depression/anxiety -continue patient psych medicines  5. DVT prophylaxis SCDs  No family in the ER.  All the records are reviewed and case discussed with ED provider. Management plans discussed with the patient, family and they are in agreement.  CODE STATUS:full  TOTAL TIME TAKING CARE OF THIS PATIENT: *45* minutes.    Enedina Finner M.D on 11/19/2018 at 5:12  PM  Between 7am to 6pm - Pager - [phone]  After 6pm go to [email or web address] - password [email or web address]  [facility]  Office  [phone]  CC: Primary care physician; [name]

## 2018-11-20 DIAGNOSIS — D649 Anemia, unspecified: Secondary | ICD-10-CM

## 2018-11-20 LAB — CBC
HCT: 23.1 % — ABNORMAL LOW (ref 36.0–46.0)
Hemoglobin: 6.2 g/dL — ABNORMAL LOW (ref 12.0–15.0)
MCH: 19.4 pg — ABNORMAL LOW (ref 26.0–34.0)
MCHC: 26.8 g/dL — ABNORMAL LOW (ref 30.0–36.0)
MCV: 72.2 fL — ABNORMAL LOW (ref 80.0–100.0)
Platelets: 242 10*3/uL (ref 150–400)
RBC: 3.2 MIL/uL — ABNORMAL LOW (ref 3.87–5.11)
RDW: 20.9 % — ABNORMAL HIGH (ref 11.5–15.5)
WBC: 6.6 10*3/uL (ref 4.0–10.5)
nRBC: 0 % (ref 0.0–0.2)

## 2018-11-20 LAB — URINALYSIS, ROUTINE W REFLEX MICROSCOPIC
Bilirubin Urine: NEGATIVE
Glucose, UA: NEGATIVE mg/dL
Hgb urine dipstick: NEGATIVE
KETONES UR: NEGATIVE mg/dL
Leukocytes, UA: NEGATIVE
Nitrite: NEGATIVE
Protein, ur: NEGATIVE mg/dL
Specific Gravity, Urine: 1.001 — ABNORMAL LOW (ref 1.005–1.030)
pH: 6 (ref 5.0–8.0)

## 2018-11-20 LAB — FERRITIN: Ferritin: 7 ng/mL — ABNORMAL LOW (ref 11–307)

## 2018-11-20 LAB — RETICULOCYTES
Immature Retic Fract: 4 % (ref 2.3–15.9)
RBC.: 3.2 MIL/uL — ABNORMAL LOW (ref 3.87–5.11)
Retic Count, Absolute: 25.9 10*3/uL (ref 19.0–186.0)
Retic Ct Pct: 0.8 % (ref 0.4–3.1)

## 2018-11-20 LAB — PREPARE RBC (CROSSMATCH)

## 2018-11-20 LAB — HEMOGLOBIN AND HEMATOCRIT, BLOOD
HCT: 30.4 % — ABNORMAL LOW (ref 36.0–46.0)
Hemoglobin: 8.9 g/dL — ABNORMAL LOW (ref 12.0–15.0)

## 2018-11-20 LAB — IRON AND TIBC
Iron: 9 ug/dL — ABNORMAL LOW (ref 28–170)
Saturation Ratios: 3 % — ABNORMAL LOW (ref 10.4–31.8)
TIBC: 335 ug/dL (ref 250–450)
UIBC: 326 ug/dL

## 2018-11-20 LAB — HEMOGLOBIN: Hemoglobin: 6 g/dL — ABNORMAL LOW (ref 12.0–15.0)

## 2018-11-20 LAB — FOLATE: Folate: 7.6 ng/mL (ref >5.9)

## 2018-11-20 MED ORDER — SODIUM CHLORIDE 0.9% IV SOLUTION
Freq: Once | INTRAVENOUS | Status: AC
  Administered 2018-11-20: 18:00:00 via INTRAVENOUS

## 2018-11-20 MED ORDER — NICOTINE 14 MG/24HR TD PT24
14.0000 mg | MEDICATED_PATCH | Freq: Every day | TRANSDERMAL | Status: DC
  Administered 2018-11-20 – 2018-11-21 (×2): 14 mg via TRANSDERMAL
  Filled 2018-11-20 (×2): qty 1

## 2018-11-20 NOTE — Plan of Care (Signed)
Dr. Text to inform of hemo 6.0 from AM labs.

## 2018-11-20 NOTE — Progress Notes (Signed)
[facility] [place] at [facility]   PATIENT NAME: Traci Duran    MR#:  [id number]  DATE OF BIRTH:  Apr 30, 1956  SUBJECTIVE:  CHIEF COMPLAINT:   Chief Complaint  Patient presents with  . Abnormal Lab   -No further bleeding noted here.  Hemoglobin dropped and staying at 6  REVIEW OF SYSTEMS:  Review of Systems  Constitutional: Positive for malaise/fatigue. Negative for chills and fever.  HENT: Negative for congestion, ear discharge, hearing loss and nosebleeds.   Eyes: Negative for blurred vision and double vision.  Respiratory: Negative for cough, shortness of breath and wheezing.   Cardiovascular: Negative for chest pain and palpitations.  Gastrointestinal: Positive for blood in stool. Negative for abdominal pain, constipation, diarrhea, nausea and vomiting.  Genitourinary: Negative for dysuria.  Musculoskeletal: Negative for myalgias.  Neurological: Negative for dizziness, focal weakness, seizures, weakness and headaches.  Psychiatric/Behavioral: Negative for depression.    DRUG ALLERGIES:   Allergies  Allergen Reactions  . Chantix [Varenicline] Hives, Shortness Of Breath and Palpitations  . Diphenhydramine Hcl Shortness Of Breath  . Benadryl [Diphenhydramine Hcl] Rash  . Niacin Rash  . Trazodone Palpitations    VITALS:  Blood pressure 125/75, pulse 77, temperature 97.6 F (36.4 C), temperature source Oral, resp. rate 18, height 5\' 7"  (1.702 m), weight 62.6 kg, SpO2 100 %.  PHYSICAL EXAMINATION:  Physical Exam  GENERAL:  62 y.o.-year-old patient lying in the bed with no acute distress.  EYES: Pupils equal, round, reactive to light and accommodation. No scleral icterus. Extraocular muscles intact.  HEENT: Head atraumatic, normocephalic. Oropharynx and nasopharynx clear.  NECK:  Supple, no jugular venous distention. No thyroid enlargement, no tenderness.  LUNGS: Normal breath sounds bilaterally, no wheezing, rales,rhonchi or crepitation. No  use of accessory muscles of respiration.  CARDIOVASCULAR: S1, S2 normal. No  rubs, or gallops.  2/6 systolic murmur is present ABDOMEN: Soft, nontender but occasional lower abdominal discomfort, nondistended. Bowel sounds present. No organomegaly or mass.  EXTREMITIES: No pedal edema, cyanosis, or clubbing.  NEUROLOGIC: Cranial nerves II through XII are intact. Muscle strength 5/5 in all extremities. Sensation intact. Gait not checked.  PSYCHIATRIC: The patient is alert and oriented x 3.  SKIN: No obvious rash, lesion, or ulcer.    LABORATORY PANEL:   CBC Recent Labs  Lab 11/20/18 1024  WBC 6.6  HGB 6.2*  HCT 23.1*  PLT 242   ------------------------------------------------------------------------------------------------------------------  Chemistries  Recent Labs  Lab 11/19/18 1502  NA 137  K 3.6  CL 107  CO2 22  GLUCOSE 105*  BUN 9  CREATININE 0.78  CALCIUM 8.9  AST 18  ALT 11  ALKPHOS 99  BILITOT 0.4   ------------------------------------------------------------------------------------------------------------------  Cardiac Enzymes No results for input(s): TROPONINI in the last 168 hours. ------------------------------------------------------------------------------------------------------------------  RADIOLOGY:  No results found.  EKG:   Orders placed or performed during the hospital encounter of 09/03/18  . ED EKG within 10 minutes  . ED EKG within 10 minutes  . EKG    ASSESSMENT AND PLAN:   62 year old female with past medical history significant for CAD, peripheral vascular disease, COPD, hypertension, hyperlipidemia presents to hospital secondary to rectal bleed and noted to be anemic.  1.  Acute on chronic anemia-known history of anemia requiring prior blood transfusions. -Hemoglobin dropped from 9 to 6 this admission. -No active bleeding at this time.  Last EGD and colonoscopy last year. -GI has been consulted.  Continue clear liquids -Check  iron labs -2 units of  packed RBC transfusion ordered for now -Hold aspirin and Plavix.  Continue daily Protonix  2.  Hypertension-on lisinopril and Coreg  3.  CAD-stable.  Hold aspirin and Plavix for now.  4.  Neuropathy-on Cymbalta, gabapentin  5.  Tobacco use disorder-counseled.  Agreed for nicotine patch  6.  DVT prophylaxis-no heparin products due to GI bleed.  On teds and SCDs   All the records are reviewed and case discussed with Care Management/Social Workerr. Management plans discussed with the patient, family and they are in agreement.  CODE STATUS: Full code  TOTAL TIME TAKING CARE OF THIS PATIENT: 37 minutes.   POSSIBLE D/C IN 2 DAYS, DEPENDING ON CLINICAL CONDITION.   Enid Baas M.D on 11/20/2018 at 12:00 PM  Between 7am to 6pm - Pager - [phone]  After 6pm go to [email or web address] - Social research officer, government  [facility] [place] [facility]  Office  [phone]  CC: Primary care physician; [name]

## 2018-11-20 NOTE — Consult Note (Signed)
Jonathon Bellows , MD [address], [place], [place], [place], [address] [address], [place], [place], [place], [address] Phone: [phone]  Fax: [phone]  Consultation  Referring Provider:     Dr Arlester Marker Primary Care Physician:  [facility] Primary Gastroenterologist:  Dr. Allen Norris          Reason for Consultation:     Anemia   Date of Admission:  11/19/2018 Date of Consultation:  11/20/2018         HPI:   Traci Duran is a 62 y.o. female who is known to our practice and seen Dr. Verl Blalock in June 2018 for anemia.  At that point of time the patient had undergone an upper endoscopy, a tiny duodenal polyp was noted.  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  And has she was hospitalized.  At that point of time there was no overt blood loss.  She had a colonoscopy again by Dr. Allen Norris in July 2018, 5 polyps were excised.  1 out of all of the polyps was a tubular adenoma.  Looking back at her labs she is always been anemic with microcytosis.  She has had iron deficiency, with a ferritin of 4.  Celiac serology negative.  She was admitted from 07/27/2017 again for symptomatic iron deficiency anemia.  And at that time she underwent an upper endoscopy which showed no cause for the iron deficiency anemia.  She was supposed to follow-up as an outpatient subsequently but did not follow-up.  She was on Plavix for recent non-STEMI in 2018.  She follows with Dr. Janese Banks oncology for the iron deficiency.  The patient presented to the hospital yesterday with rectal bleeding of 1 day duration. She says she had one isolate episode of blood on the stool last week none prior. Denies any nasal , blood in urine, vaginal bleeds or black colored stools. Denies any NSAID use. Has been gaining weight .  In the emergency room the hemoglobin was noted to be 7.2 g.  This morning hemoglobin is 6 g iron studies are pending She has been on aspirin and Plavix together.Last dose was on Friday     Past Medical History:  Diagnosis Date  . Anemia   . Anxiety   . COPD (chronic obstructive pulmonary disease) ([place])   . Coronary artery disease   . Depression   . Gastritis   . GERD (gastroesophageal reflux disease)   . Hemorrhoids   . Hyperlipidemia   . Hypertension   . PAD (peripheral artery disease) ([place])     Past Surgical History:  Procedure Laterality Date  . APPENDECTOMY    . COLONOSCOPY WITH PROPOFOL N/A 06/09/2017   Procedure: COLONOSCOPY WITH PROPOFOL;  Surgeon: Lucilla Lame, MD;  Location[facility] ENDOSCOPY;  Service: Endoscopy;  Laterality: N/A;  . ESOPHAGOGASTRODUODENOSCOPY N/A 07/30/2017   Procedure: ESOPHAGOGASTRODUODENOSCOPY (EGD);  Surgeon: Lin Landsman, MD;  Location[facility] ENDOSCOPY;  Service: Gastroenterology;  Laterality: N/A;  . ESOPHAGOGASTRODUODENOSCOPY (EGD) WITH PROPOFOL N/A 04/09/2017   Procedure: ESOPHAGOGASTRODUODENOSCOPY (EGD) WITH PROPOFOL;  Surgeon: Lucilla Lame, MD;  Location: [facility] ENDOSCOPY;  Service: Endoscopy;  Laterality: N/A;  . HIP FRACTURE SURGERY      Prior to Admission medications   Medication Sig Start Date End Date Taking? Authorizing Provider  albuterol (PROVENTIL HFA;VENTOLIN HFA) 108 (90 Base) MCG/ACT inhaler Inhale 2 puffs into the lungs every 6 (six) hours as needed for wheezing or shortness of breath. 04/02/18  Yes Merlyn Lot, MD  alprazolam Duanne Moron)  2 MG tablet Take 2 mg by mouth 2 (two) times daily.    Yes [provider]  atorvastatin (LIPITOR) 80 MG tablet Take 80 mg by mouth daily.   Yes [provider]  busPIRone (BUSPAR) 10 MG tablet Take 10 mg by mouth 2 (two) times daily.    Yes [provider]  carvedilol (COREG) 6.25 MG tablet Take 6.25 mg by mouth 2 (two) times daily. 05/28/17  Yes [provider]  cetirizine (ZYRTEC) 10 MG tablet Take 10 mg by mouth daily. 08/12/18  Yes [provider]  citalopram (CELEXA) 40 MG tablet Take 40 mg by mouth daily.   Yes [provider]  clopidogrel (PLAVIX) 75 MG tablet Take 75 mg by mouth daily.   Yes [provider]  DULoxetine (CYMBALTA) 30 MG capsule Take 30 mg by mouth daily. 05/28/17  Yes [provider]  lisinopril (PRINIVIL,ZESTRIL) 20 MG tablet Take 20 mg by mouth daily.     Yes [provider]  Multiple Vitamin (MULTIVITAMIN WITH MINERALS) TABS tablet Take 2 tablets by mouth daily.   Yes [provider]  nitroGLYCERIN (NITROSTAT) 0.4 MG SL tablet Place 0.4 mg under the tongue every 5 (five) minutes as needed.    Yes [provider]  pantoprazole (PROTONIX) 20 MG tablet Take 20 mg by mouth daily.   Yes [provider]  pregabalin (LYRICA) 150 MG capsule Take 150 mg by mouth 2 (two) times daily. 11/18/18 11/18/19 Yes [provider]  SYMBICORT 80-4.5 MCG/ACT inhaler Inhale 2 puffs into the lungs 2 (two) times daily. 08/12/18  Yes [provider]  Vitamin D, Ergocalciferol, (DRISDOL) 50000 units CAPS capsule Take 50,000 Units by mouth every Saturday.  05/01/17  Yes [provider]  betamethasone valerate ointment (VALISONE) 0.1 % Apply 1 application topically 2 (two) times daily. 08/19/18   [provider]  ferrous sulfate 325 (65 FE) MG tablet Take 1 tablet (325 mg total) by mouth 3 (three) times daily with meals. Patient not taking: Reported on 11/19/2018 07/30/17   Fritzi Mandes, MD  gabapentin (NEURONTIN) 300 MG capsule Take 300 mg by mouth 3 (three) times daily.     [provider]  naproxen (NAPROSYN) 500 MG tablet Take 500 mg by mouth 2 (two) times daily as needed for pain. 08/17/18   [provider]  potassium chloride (K-DUR) 10 MEQ tablet Take 1 tablet (10 mEq total) by mouth daily. Patient not taking: Reported on 09/04/2018 05/28/17   Earleen Newport, MD    Family History  Problem Relation Age of Onset  . Heart failure Mother   . Heart attack Mother        mother died at 16 of an MI  . Heart attack  Father        father died at 39 of an MI  . Heart attack Brother        Brother had an MI in his 59s     Social History   Tobacco Use  . Smoking status: Current Some Day Smoker    Packs/day: 1.00    Years: 46.00    Pack years: 46.00    Types: Cigarettes  . Smokeless tobacco: Never Used  . Tobacco comment: she feels that the 21 nicotine made her jittery and we suggested a lower level patch  Substance Use Topics  . Alcohol use: No  . Drug use: Yes    Types: Marijuana    Comment: monthly  Allergies as of 11/19/2018 - Review Complete 11/19/2018  Allergen Reaction Noted  . Chantix [varenicline] Hives, Shortness Of Breath, and Palpitations 09/06/2012  . Diphenhydramine hcl Shortness Of Breath   . Benadryl [diphenhydramine hcl] Rash 10/14/2011  . Niacin Rash   . Trazodone Palpitations 05/23/2015    Review of Systems:    All systems reviewed and negative except where noted in HPI.   Physical Exam:  Vital signs in last 24 hours: Temp:  [97.7 F (36.5 C)-98.4 F (36.9 C)] 97.7 F (36.5 C) (12/14 0407) Pulse Rate:  [76-88] 76 (12/14 0407) Resp:  [16-20] 18 (12/14 0407) BP: (107-158)/(59-89) 120/83 (12/14 0407) SpO2:  [95 %-98 %] 95 % (12/14 0407) Weight:  [62.6 kg] 62.6 kg (12/13 1454) Last BM Date: 11/18/18 General:   Pleasant, cooperative in NAD Head:  Normocephalic and atraumatic. Eyes:   No icterus.   Conjunctiva pink. PERRLA. Ears:  Normal auditory acuity. Neck:  Supple; no masses or thyroidomegaly Lungs: Respirations even and unlabored. Lungs clear to auscultation bilaterally.   No wheezes, crackles, or rhonchi.  Heart:  Regular rate and rhythm;  Without murmur, clicks, rubs or gallops Abdomen:  Soft, nondistended, nontender. Normal bowel sounds. No appreciable masses or hepatomegaly.  No rebound or guarding.  Neurologic:  Alert and oriented x3;  grossly normal neurologically. Skin:  Intact without significant lesions or rashes. Cervical Nodes:  No significant  cervical adenopathy. Psych:  Alert and cooperative. Normal affect.  LAB RESULTS: Recent Labs    11/19/18 1502 11/19/18 1842 11/20/18 0619  WBC 7.0  --   --   HGB 7.2* 7.5* 6.0*  HCT 26.3*  --   --   PLT 309  --   --    BMET Recent Labs    11/19/18 1502  NA 137  K 3.6  CL 107  CO2 22  GLUCOSE 105*  BUN 9  CREATININE 0.78  CALCIUM 8.9   LFT Recent Labs    11/19/18 1502  PROT 7.2  ALBUMIN 4.0  AST 18  ALT 11  ALKPHOS 99  BILITOT 0.4   PT/INR Recent Labs    11/19/18 1502  LABPROT 13.6  INR 1.05    STUDIES: No results found.    Impression / Plan:   Traci Duran is a 62 y.o. y/o female with a history of new onset iron deficiency anemia initially noted in February 2018.  Has undergone an upper endoscopy and colonoscopy by Dr. Verl Blalock in 2018.  Subsequently has had no follow-up in our GI office although was suggested to do so.  Has not undergone small bowel visualization with a capsule study.  Presented to the hospital with rectal bleeding.  On aspirin and Plavix.  Presently labs suggest a severe microcytic anemia.  I suspect it is an acute on chronic process.  In view of the severe iron deficiency anemia I would suggest this patient would require an EGD colonoscopy and capsule study of the small bowel to be repeated.  I would suggest a push enteroscopy rather than upper endoscopy to be performed along with a colonoscopy.  And if negative we will perform a capsule study the same day.  In the meanwhile I would suggest that the patient be transfused, Plavix held after consulting cardiology.  We will plan procedures when she is off Plavix for at least 5 days which would be wednesday.  Check iron studies, b12,folate,urine  I have discussed alternative options, risks & benefits,  which include, but are not limited to,  bleeding, infection, perforation,respiratory complication & drug reaction.  The patient agrees with this plan & written consent will be obtained.      Thank you for involving me in the care of this patient.      LOS: 1 day   Jonathon Bellows, MD  11/20/2018, 10:40 AM

## 2018-11-21 DIAGNOSIS — D509 Iron deficiency anemia, unspecified: Secondary | ICD-10-CM

## 2018-11-21 LAB — TYPE AND SCREEN
ABO/RH(D): O POS
Antibody Screen: NEGATIVE
UNIT DIVISION: 0
Unit division: 0

## 2018-11-21 LAB — BPAM RBC
Blood Product Expiration Date: 202001072359
Blood Product Expiration Date: 202001072359
ISSUE DATE / TIME: 201912141358
ISSUE DATE / TIME: 201912141739
Unit Type and Rh: 5100
Unit Type and Rh: 5100

## 2018-11-21 LAB — CBC
HCT: 33.7 % — ABNORMAL LOW (ref 36.0–46.0)
Hemoglobin: 9.8 g/dL — ABNORMAL LOW (ref 12.0–15.0)
MCH: 21.9 pg — ABNORMAL LOW (ref 26.0–34.0)
MCHC: 29.1 g/dL — ABNORMAL LOW (ref 30.0–36.0)
MCV: 75.4 fL — AB (ref 80.0–100.0)
Platelets: 246 10*3/uL (ref 150–400)
RBC: 4.47 MIL/uL (ref 3.87–5.11)
RDW: 20.7 % — ABNORMAL HIGH (ref 11.5–15.5)
WBC: 7 10*3/uL (ref 4.0–10.5)
nRBC: 0 % (ref 0.0–0.2)

## 2018-11-21 LAB — HEMOGLOBIN: Hemoglobin: 9.6 g/dL — ABNORMAL LOW (ref 12.0–15.0)

## 2018-11-21 LAB — VITAMIN B12: Vitamin B-12: 188 pg/mL (ref 180–914)

## 2018-11-21 MED ORDER — SODIUM CHLORIDE 0.9 % IV SOLN
200.0000 mg | INTRAVENOUS | Status: DC
  Administered 2018-11-21: 200 mg via INTRAVENOUS
  Filled 2018-11-21 (×2): qty 10

## 2018-11-21 MED ORDER — VITAMIN B-12 1000 MCG PO TABS: 500.0000 ug | ORAL_TABLET | Freq: Every day | ORAL | Status: DC

## 2018-11-21 MED ORDER — CYANOCOBALAMIN 1000 MCG/ML IJ SOLN
1000.0000 ug | INTRAMUSCULAR | Status: AC
  Administered 2018-11-21: 1000 ug via INTRAMUSCULAR
  Filled 2018-11-21: qty 1

## 2018-11-21 MED ORDER — SODIUM CHLORIDE 0.9% IV SOLUTION: Freq: Once | INTRAVENOUS | Status: DC

## 2018-11-21 MED ORDER — TRAMADOL HCL 50 MG PO TABS: 50.0000 mg | ORAL_TABLET | Freq: Three times a day (TID) | ORAL | 0 refills | Status: DC | PRN

## 2018-11-21 MED ORDER — CYANOCOBALAMIN 500 MCG PO TABS: 500.0000 ug | ORAL_TABLET | Freq: Every day | ORAL | 1 refills | Status: DC

## 2018-11-21 MED ORDER — HYDROCODONE-ACETAMINOPHEN 5-325 MG PO TABS
1.0000 | ORAL_TABLET | ORAL | Status: DC | PRN
  Administered 2018-11-21: 1 via ORAL
  Filled 2018-11-21: qty 1

## 2018-11-21 NOTE — Progress Notes (Signed)
[facility] [place] at [facility]   PATIENT NAME: Traci Duran    MR#:  [id number]  DATE OF BIRTH:  [birth year]  SUBJECTIVE:  CHIEF COMPLAINT:   Chief Complaint  Patient presents with  . Abnormal Lab   -No further bleeding.  Complains of restless legs and leg pain. -Hemoglobin improved after transfusion  REVIEW OF SYSTEMS:  Review of Systems  Constitutional: Negative for chills, fever and malaise/fatigue.  HENT: Negative for congestion, ear discharge, hearing loss and nosebleeds.   Eyes: Negative for blurred vision and double vision.  Respiratory: Negative for cough, shortness of breath and wheezing.   Cardiovascular: Negative for chest pain and palpitations.  Gastrointestinal: Negative for abdominal pain, blood in stool, constipation, diarrhea, nausea and vomiting.  Genitourinary: Negative for dysuria.  Musculoskeletal: Positive for myalgias.  Neurological: Negative for dizziness, focal weakness, seizures, weakness and headaches.  Psychiatric/Behavioral: Negative for depression.    DRUG ALLERGIES:   Allergies  Allergen Reactions  . Chantix [Varenicline] Hives, Shortness Of Breath and Palpitations  . Diphenhydramine Hcl Shortness Of Breath  . Benadryl [Diphenhydramine Hcl] Rash  . Niacin Rash  . Trazodone Palpitations    VITALS:  Blood pressure (!) 148/87, pulse 80, temperature (!) 97.2 F (36.2 C), temperature source Oral, resp. rate 18, height 5\' 7"  (1.702 m), weight 62.6 kg, SpO2 98 %.  PHYSICAL EXAMINATION:  Physical Exam  GENERAL:  62 y.o.-year-old patient lying in the bed with no acute distress.  EYES: Pupils equal, round, reactive to light and accommodation. No scleral icterus. Extraocular muscles intact.  HEENT: Head atraumatic, normocephalic. Oropharynx and nasopharynx clear.  NECK:  Supple, no jugular venous distention. No thyroid enlargement, no tenderness.  LUNGS: Normal breath sounds bilaterally, no wheezing, rales,rhonchi or  crepitation. No use of accessory muscles of respiration.  CARDIOVASCULAR: S1, S2 normal. No  rubs, or gallops.  2/6 systolic murmur is present ABDOMEN: Soft, nontender but occasional lower abdominal discomfort, nondistended. Bowel sounds present. No organomegaly or mass.  EXTREMITIES: No pedal edema, cyanosis, or clubbing.  NEUROLOGIC: Cranial nerves II through XII are intact. Muscle strength 5/5 in all extremities. Sensation intact. Gait not checked.  PSYCHIATRIC: The patient is alert and oriented x 3.  SKIN: No obvious rash, lesion, or ulcer.    LABORATORY PANEL:   CBC Recent Labs  Lab 11/21/18 0621  WBC 7.0  HGB 9.8*  HCT 33.7*  PLT 246   ------------------------------------------------------------------------------------------------------------------  Chemistries  Recent Labs  Lab 11/19/18 1502  NA 137  K 3.6  CL 107  CO2 22  GLUCOSE 105*  BUN 9  CREATININE 0.78  CALCIUM 8.9  AST 18  ALT 11  ALKPHOS 99  BILITOT 0.4   ------------------------------------------------------------------------------------------------------------------  Cardiac Enzymes No results for input(s): TROPONINI in the last 168 hours. ------------------------------------------------------------------------------------------------------------------  RADIOLOGY:  No results found.  EKG:   Orders placed or performed during the hospital encounter of 09/03/18  . ED EKG within 10 minutes  . ED EKG within 10 minutes  . EKG    ASSESSMENT AND PLAN:   62 year old female with past medical history significant for CAD, peripheral vascular disease, COPD, hypertension, hyperlipidemia presents to hospital secondary to rectal bleed and noted to be anemic.  1.  Acute on chronic anemia-known history of anemia requiring prior blood transfusions. -Hemoglobin dropped from 9 to 6 this admission. -No active bleeding at this time.  Last EGD and colonoscopy last year with no significant findings. -Severely  low iron levels.  Receiving IV iron -Received  2 units packed RBC transfusion yesterday and hemoglobin at 9.  No further active bleeding. -Hold aspirin and Plavix.  Continue daily Protonix -Patient will need repeat endoscopy-push endoscopy, and colonoscopy, possible capsule endoscopy if the above are negative. -Due to her recent Plavix dose, she has to wait until 11/24/2018 for further procedures.  Since she is stable, will check with GI if this can be set up as outpatient.  2.  Hypertension-on lisinopril and Coreg  3.  CAD-stable.  Hold aspirin and Plavix for now.  4.  Neuropathy-on Cymbalta, gabapentin  5.  Tobacco use disorder-counseled.   nicotine patch  6.  DVT prophylaxis-no heparin products due to GI bleed.  On teds and SCDs  Patient is up and ambulatory   All the records are reviewed and case discussed with Care Management/Social Workerr. Management plans discussed with the patient, family and they are in agreement.  CODE STATUS: Full code  TOTAL TIME TAKING CARE OF THIS PATIENT: 37 minutes.   POSSIBLE D/C IN 1-2 DAYS, DEPENDING ON CLINICAL CONDITION.   Enid Baas M.D on 11/21/2018 at 11:02 AM  Between 7am to 6pm - Pager - [phone]  After 6pm go to [email or web address] - Social research officer, government  [facility] [place] [facility]  Office  [phone]  CC: Primary care physician; [name]

## 2018-11-21 NOTE — Progress Notes (Signed)
Traci Duran , MD [address] [place], [place], [place], [address] [address], [place], [place], [place], [address] Phone: [phone]  Fax: [phone]   Traci Duran is being followed for Anemia   Subjective: Denies any bleeding had a brown bowel movement today feels well.   Objective: Vital signs in last 24 hours: Vitals:   11/20/18 1759 11/20/18 2025 11/21/18 0535 11/21/18 1204  BP: 122/90 (!) 154/87 (!) 148/87 (!) 153/100  Pulse: 86 80 80 81  Resp: 18 17 18 18   Temp: 98.2 F (36.8 C) 98.6 F (37 C) (!) 97.2 F (36.2 C)   TempSrc: Oral Oral Oral   SpO2: 100% 98% 98% 100%  Weight:      Height:       Weight change:   Intake/Output Summary (Last 24 hours) at 11/21/2018 1347 Last data filed at 11/20/2018 2025 Gross per 24 hour  Intake 2825.33 ml  Output 1 ml  Net 2824.33 ml     Exam: Heart:: Regular rate and rhythm, S1S2 present or without murmur or extra heart sounds Lungs: normal, clear to auscultation and clear to auscultation and percussion Abdomen: soft, nontender, normal bowel sounds   Lab Results: @LABTEST2 @ Micro Results: No results found for this or any previous visit (from the past 240 hour(s)). Studies/Results: No results found. Medications: I have reviewed the patient's current medications. Scheduled Meds: . sodium chloride   Intravenous Once  . alprazolam  2 mg Oral BID  . atorvastatin  80 mg Oral Daily  . busPIRone  10 mg Oral BID  . carvedilol  6.25 mg Oral BID  . citalopram  40 mg Oral Daily  . DULoxetine  30 mg Oral Daily  . gabapentin  300 mg Oral TID  . lisinopril  20 mg Oral Daily  . loratadine  10 mg Oral Daily  . mometasone-formoterol  2 puff Inhalation BID  . multivitamin with minerals  2 tablet Oral Daily  . nicotine  14 mg Transdermal Daily  . pantoprazole  20 mg Oral Daily  . pregabalin  150 mg Oral BID   Continuous Infusions: . iron sucrose 200 mg (11/21/18 0958)   PRN Meds:.acetaminophen **OR**  acetaminophen, albuterol, HYDROcodone-acetaminophen, nitroGLYCERIN, ondansetron **OR** ondansetron (ZOFRAN) IV, polyethylene glycol, traMADol  CBC Latest Ref Rng & Units 11/21/2018 11/20/2018 11/20/2018  WBC 4.0 - 10.5 K/uL 7.0 - 6.6  Hemoglobin 12.0 - 15.0 g/dL 9.8(L) 8.9(L) 6.2(L)  Hematocrit 36.0 - 46.0 % 33.7(L) 30.4(L) 23.1(L)  Platelets 150 - 400 K/uL 246 - 242    Assessment: Active Problems:   Rectal bleed  Traci Duran is a 62 y.o. y/o female with a history of new onset iron deficiency anemia initially noted in February 2018.  Has undergone an upper endoscopy and colonoscopy by Dr. Verl Blalock in 2018.  Subsequently has had no follow-up in our GI office although was suggested to do so.  Has not undergone small bowel visualization with a capsule study.  Presented to the hospital with rectal bleeding.  On aspirin and Plavix.  's admission noted to have severe microcytic anemia.  I suspect it is an acute on chronic process.  In view of the severe iron deficiency anemia I would suggest this patient would require an EGD colonoscopy and capsule study of the small bowel to be repeated.  I would suggest a push enteroscopy rather than upper endoscopy to be performed along with a colonoscopy.  And if negative we will perform a capsule study the same  day.  Plavix is being held.  She has not had any bleeding while in the hospital.  Presently having brown stools.   Plan 1.  Commence on intramuscular B12 shots. 2.  IV iron as an inpatient and will require follow-up with hematology which she is already established with to continue IV iron. 3.  Recheck her hemoglobin in 2 or 3 hours time and if stable she should go home and I will arrange for her EGD and colonoscopy to be performed on Wednesday.  Home on a PPI.   LOS: 2 days   Traci Bellows, MD 11/21/2018, 1:47 PM

## 2018-11-21 NOTE — Progress Notes (Signed)
11/21/2018 8:47 PM  Traci Duran to be D/C'd Home per MD order.  Discussed prescriptions and follow up appointments with the patient. Prescriptions given to patient, medication list explained in detail. Pt verbalized understanding.  Allergies as of 11/21/2018      Reactions   Chantix [varenicline] Hives, Shortness Of Breath, Palpitations   Diphenhydramine Hcl Shortness Of Breath   Benadryl [diphenhydramine Hcl] Rash   Niacin Rash   Trazodone Palpitations      Medication List    STOP taking these medications   clopidogrel 75 MG tablet Commonly known as:  PLAVIX   naproxen 500 MG tablet Commonly known as:  NAPROSYN   potassium chloride 10 MEQ tablet Commonly known as:  K-DUR     TAKE these medications   albuterol 108 (90 Base) MCG/ACT inhaler Commonly known as:  PROVENTIL HFA;VENTOLIN HFA Inhale 2 puffs into the lungs every 6 (six) hours as needed for wheezing or shortness of breath.   alprazolam 2 MG tablet Commonly known as:  XANAX Take 2 mg by mouth 2 (two) times daily.   atorvastatin 80 MG tablet Commonly known as:  LIPITOR Take 80 mg by mouth daily.   betamethasone valerate ointment 0.1 % Commonly known as:  VALISONE Apply 1 application topically 2 (two) times daily.   busPIRone 10 MG tablet Commonly known as:  BUSPAR Take 10 mg by mouth 2 (two) times daily.   carvedilol 6.25 MG tablet Commonly known as:  COREG Take 6.25 mg by mouth 2 (two) times daily.   cetirizine 10 MG tablet Commonly known as:  ZYRTEC Take 10 mg by mouth daily.   citalopram 40 MG tablet Commonly known as:  CELEXA Take 40 mg by mouth daily.   DULoxetine 30 MG capsule Commonly known as:  CYMBALTA Take 30 mg by mouth daily.   ferrous sulfate 325 (65 FE) MG tablet Take 1 tablet (325 mg total) by mouth 3 (three) times daily with meals.   gabapentin 300 MG capsule Commonly known as:  NEURONTIN Take 300 mg by mouth 3 (three) times daily.   lisinopril 20 MG tablet Commonly  known as:  PRINIVIL,ZESTRIL Take 20 mg by mouth daily.   multivitamin with minerals Tabs tablet Take 2 tablets by mouth daily.   nitroGLYCERIN 0.4 MG SL tablet Commonly known as:  NITROSTAT Place 0.4 mg under the tongue every 5 (five) minutes as needed.   pantoprazole 20 MG tablet Commonly known as:  PROTONIX Take 20 mg by mouth daily.   pregabalin 150 MG capsule Commonly known as:  LYRICA Take 150 mg by mouth 2 (two) times daily.   SYMBICORT 80-4.5 MCG/ACT inhaler Generic drug:  budesonide-formoterol Inhale 2 puffs into the lungs 2 (two) times daily.   traMADol 50 MG tablet Commonly known as:  ULTRAM Take 1 tablet (50 mg total) by mouth 3 (three) times daily as needed for moderate pain.   vitamin B-12 500 MCG tablet Commonly known as:  CYANOCOBALAMIN Take 1 tablet (500 mcg total) by mouth daily.   Vitamin D (Ergocalciferol) 1.25 MG (50000 UT) Caps capsule Commonly known as:  DRISDOL Take 50,000 Units by mouth every Saturday.       Vitals:   11/21/18 0535 11/21/18 1204  BP: (!) 148/87 (!) 153/100  Pulse: 80 81  Resp: 18 18  Temp: (!) 97.2 F (36.2 C)   SpO2: 98% 100%    Skin clean, dry and intact without evidence of skin break down, no evidence of skin tears noted. IV  catheter discontinued intact. Site without signs and symptoms of complications. Dressing and pressure applied. Pt denies pain at this time. No complaints noted.  An After Visit Summary was printed and given to the patient. Patient escorted via [place], and D/C home via private auto.  Fuller Mandril, RN

## 2018-11-22 ENCOUNTER — Other Ambulatory Visit: Payer: Self-pay

## 2018-11-22 ENCOUNTER — Telehealth: Payer: Self-pay

## 2018-11-22 DIAGNOSIS — K922 Gastrointestinal hemorrhage, unspecified: Secondary | ICD-10-CM

## 2018-11-22 NOTE — Discharge Summary (Signed)
[facility]   PATIENT NAME: Traci Duran    MR#:  [id number]  DATE OF BIRTH:  1956/09/19  DATE OF ADMISSION:  11/19/2018   ADMITTING PHYSICIAN: Enedina Finner, MD  DATE OF DISCHARGE: 11/21/2018  6:36 PM  PRIMARY CARE PHYSICIAN: [place], [facility]   ADMISSION DIAGNOSIS:   Lower GI bleed [K92.2] Symptomatic anemia [D64.9]  DISCHARGE DIAGNOSIS:   Active Problems:   Rectal bleed   SECONDARY DIAGNOSIS:   Past Medical History:  Diagnosis Date  . Anemia   . Anxiety   . COPD (chronic obstructive pulmonary disease) (HCC)   . Coronary artery disease   . Depression   . Gastritis   . GERD (gastroesophageal reflux disease)   . Hemorrhoids   . Hyperlipidemia   . Hypertension   . PAD (peripheral artery disease) [facility])     HOSPITAL COURSE:   62 year old female with past medical history significant for CAD, peripheral vascular disease, COPD, hypertension, hyperlipidemia presents to hospital secondary to rectal bleed and noted to be anemic.  1.  Acute on chronic anemia-known history of anemia requiring prior blood transfusions. -Hemoglobin dropped from 9 to 6 this admission.  Received 2 units blood transfusion during this admission and hemoglobin stable and greater than 9 prior to discharge. -No active bleeding .  Last EGD and colonoscopy last year with no significant findings. -Severely low iron levels.  Received IV iron and also intramuscular B12 injections for low B12.  Also discharged on oral B12 supplements. -Hold  Plavix at discharge. Continue daily PPI -Patient will need repeat endoscopy-push endoscopy, and colonoscopy, possible capsule endoscopy if the above are negative. -Due to her recent Plavix dose, she has to wait until 11/24/2018 for further procedures.  Since she is stable, patient is being discharged and these procedures will be scheduled as outpatient this week.  2.  Hypertension-on lisinopril and Coreg  3.   CAD-stable.  Hold aspirin and Plavix for now.  4.  Neuropathy-on Cymbalta, gabapentin  5.  Tobacco use disorder-counseled.   6.  Depression and anxiety-Celexa, BuSpar and Xanax.   Patient is up and ambulatory Discharge today   DISCHARGE CONDITIONS:   Guarded  CONSULTS OBTAINED:   Treatment Team:  Wyline Mood, MD  DRUG ALLERGIES:   Allergies  Allergen Reactions  . Chantix [Varenicline] Hives, Shortness Of Breath and Palpitations  . Diphenhydramine Hcl Shortness Of Breath  . Benadryl [Diphenhydramine Hcl] Rash  . Niacin Rash  . Trazodone Palpitations   DISCHARGE MEDICATIONS:   Allergies as of 11/21/2018      Reactions   Chantix [varenicline] Hives, Shortness Of Breath, Palpitations   Diphenhydramine Hcl Shortness Of Breath   Benadryl [diphenhydramine Hcl] Rash   Niacin Rash   Trazodone Palpitations      Medication List    STOP taking these medications   clopidogrel 75 MG tablet Commonly known as:  PLAVIX   naproxen 500 MG tablet Commonly known as:  NAPROSYN   potassium chloride 10 MEQ tablet Commonly known as:  K-DUR     TAKE these medications   albuterol 108 (90 Base) MCG/ACT inhaler Commonly known as:  PROVENTIL HFA;VENTOLIN HFA Inhale 2 puffs into the lungs every 6 (six) hours as needed for wheezing or shortness of breath.   alprazolam 2 MG tablet Commonly known as:  XANAX Take 2 mg by mouth 2 (two) times daily.   atorvastatin 80 MG tablet Commonly known as:  LIPITOR Take 80 mg by mouth daily.  betamethasone valerate ointment 0.1 % Commonly known as:  VALISONE Apply 1 application topically 2 (two) times daily.   busPIRone 10 MG tablet Commonly known as:  BUSPAR Take 10 mg by mouth 2 (two) times daily.   carvedilol 6.25 MG tablet Commonly known as:  COREG Take 6.25 mg by mouth 2 (two) times daily.   cetirizine 10 MG tablet Commonly known as:  ZYRTEC Take 10 mg by mouth daily.   citalopram 40 MG tablet Commonly known as:   CELEXA Take 40 mg by mouth daily.   DULoxetine 30 MG capsule Commonly known as:  CYMBALTA Take 30 mg by mouth daily.   ferrous sulfate 325 (65 FE) MG tablet Take 1 tablet (325 mg total) by mouth 3 (three) times daily with meals.   gabapentin 300 MG capsule Commonly known as:  NEURONTIN Take 300 mg by mouth 3 (three) times daily.   lisinopril 20 MG tablet Commonly known as:  PRINIVIL,ZESTRIL Take 20 mg by mouth daily.   multivitamin with minerals Tabs tablet Take 2 tablets by mouth daily.   nitroGLYCERIN 0.4 MG SL tablet Commonly known as:  NITROSTAT Place 0.4 mg under the tongue every 5 (five) minutes as needed.   pantoprazole 20 MG tablet Commonly known as:  PROTONIX Take 20 mg by mouth daily.   pregabalin 150 MG capsule Commonly known as:  LYRICA Take 150 mg by mouth 2 (two) times daily.   SYMBICORT 80-4.5 MCG/ACT inhaler Generic drug:  budesonide-formoterol Inhale 2 puffs into the lungs 2 (two) times daily.   traMADol 50 MG tablet Commonly known as:  ULTRAM Take 1 tablet (50 mg total) by mouth 3 (three) times daily as needed for moderate pain.   vitamin B-12 500 MCG tablet Commonly known as:  CYANOCOBALAMIN Take 1 tablet (500 mcg total) by mouth daily.   Vitamin D (Ergocalciferol) 1.25 MG (50000 UT) Caps capsule Commonly known as:  DRISDOL Take 50,000 Units by mouth every Saturday.        DISCHARGE INSTRUCTIONS:   1. GI f/u in 3 days for EGD, colonoscopy and may be capsule endoscopy 2. PCP f/u in 1-2 weeks  DIET:   Cardiac diet  ACTIVITY:   Activity as tolerated  OXYGEN:   Home Oxygen: No.  Oxygen Delivery: room air  DISCHARGE LOCATION:   home   If you experience worsening of your admission symptoms, develop shortness of breath, life threatening emergency, suicidal or homicidal thoughts you must seek medical attention immediately by calling 911 or calling your MD immediately  if symptoms less severe.  You Must read complete  instructions/literature along with all the possible adverse reactions/side effects for all the Medicines you take and that have been prescribed to you. Take any new Medicines after you have completely understood and accpet all the possible adverse reactions/side effects.   Please note  You were cared for by a hospitalist during your hospital stay. If you have any questions about your discharge medications or the care you received while you were in the hospital after you are discharged, you can call the unit and asked to speak with the hospitalist on call if the hospitalist that took care of you is not available. Once you are discharged, your primary care physician will handle any further medical issues. Please note that NO REFILLS for any discharge medications will be authorized once you are discharged, as it is imperative that you return to your primary care physician (or establish a relationship with a primary care physician if  you do not have one) for your aftercare needs so that they can reassess your need for medications and monitor your lab values.    On the day of Discharge:  VITAL SIGNS:   Blood pressure (!) 153/100, pulse 81, temperature (!) 97.2 F (36.2 C), temperature source Oral, resp. rate 18, height 5\' 7"  (1.702 m), weight 62.6 kg, SpO2 100 %.  PHYSICAL EXAMINATION:   GENERAL:  62 y.o.-year-old patient lying in the bed with no acute distress.  EYES: Pupils equal, round, reactive to light and accommodation. No scleral icterus. Extraocular muscles intact.  HEENT: Head atraumatic, normocephalic. Oropharynx and nasopharynx clear.  NECK:  Supple, no jugular venous distention. No thyroid enlargement, no tenderness.  LUNGS: Normal breath sounds bilaterally, no wheezing, rales,rhonchi or crepitation. No use of accessory muscles of respiration.  CARDIOVASCULAR: S1, S2 normal. No  rubs, or gallops.  2/6 systolic murmur is present ABDOMEN: Soft, nontender but occasional lower abdominal  discomfort, nondistended. Bowel sounds present. No organomegaly or mass.  EXTREMITIES: No pedal edema, cyanosis, or clubbing.  NEUROLOGIC: Cranial nerves II through XII are intact. Muscle strength 5/5 in all extremities. Sensation intact. Gait not checked.  PSYCHIATRIC: The patient is alert and oriented x 3.  SKIN: No obvious rash, lesion, or ulcer.    DATA REVIEW:   CBC Recent Labs  Lab 11/21/18 0621 11/21/18 1548  WBC 7.0  --   HGB 9.8* 9.6*  HCT 33.7*  --   PLT 246  --     Chemistries  Recent Labs  Lab 11/19/18 1502  NA 137  K 3.6  CL 107  CO2 22  GLUCOSE 105*  BUN 9  CREATININE 0.78  CALCIUM 8.9  AST 18  ALT 11  ALKPHOS 99  BILITOT 0.4     Microbiology Results  Results for orders placed or performed during the hospital encounter of 04/06/17  Blood Culture (routine x 2)     Status: None   Collection Time: 04/06/17  9:03 PM  Result Value Ref Range Status   Specimen Description BLOOD LEFT ARM  Final   Special Requests Blood Culture adequate volume  Final   Culture NO GROWTH 5 DAYS  Final   Report Status 04/11/2017 FINAL  Final  Blood Culture (routine x 2)     Status: None   Collection Time: 04/06/17  9:03 PM  Result Value Ref Range Status   Specimen Description BLOOD LEFT HAND  Final   Special Requests   Final    BOTTLES DRAWN AEROBIC AND ANAEROBIC Blood Culture results may not be optimal due to an excessive volume of blood received in culture bottles   Culture NO GROWTH 5 DAYS  Final   Report Status 04/11/2017 FINAL  Final  MRSA PCR Screening     Status: None   Collection Time: 04/07/17 12:23 AM  Result Value Ref Range Status   MRSA by PCR NEGATIVE NEGATIVE Final    Comment:        The GeneXpert MRSA Assay (FDA approved for NASAL specimens only), is one component of a comprehensive MRSA colonization surveillance program. It is not intended to diagnose MRSA infection nor to guide or monitor treatment for MRSA infections.   Urine culture      Status: None   Collection Time: 04/07/17  3:24 AM  Result Value Ref Range Status   Specimen Description URINE, RANDOM  Final   Special Requests NONE  Final   Culture   Final    NO GROWTH Performed  at [facility], [address] [place], [place] [address]    Report Status 04/08/2017 FINAL  Final    RADIOLOGY:  No results found.   Management plans discussed with the patient, family and they are in agreement.  CODE STATUS:  Code Status History    Date Active Date Inactive Code Status Order ID Comments User Context   11/19/2018 1829 11/21/2018 2220 Full Code [id number]  Enedina Finner, MD Inpatient   09/03/2018 1856 09/05/2018 1659 Full Code [id number]  Katha Hamming, MD ED   10/11/2017 0559 10/12/2017 1348 Full Code [id number]  Arnaldo Natal, MD Inpatient   07/28/2017 1413 07/30/2017 2315 Full Code [id number]  Milagros Loll, MD ED   04/07/2017 0022 04/09/2017 1710 Full Code [id number]  Ihor Austin, MD Inpatient   10/14/2011 2215 10/15/2011 1727 Full Code [id number]  Mays, Joyice Faster, RN Inpatient      TOTAL TIME TAKING CARE OF THIS PATIENT: 38 minutes.    Enid Baas M.D on 11/22/2018 at 2:35 PM  Between 7am to 6pm - Pager - [phone]  After 6pm go to [email or web address] - Social research officer, government  [facility]  Office  [phone]  CC: Primary care physician; [name]   Note: This dictation was prepared with Dragon dictation along with smaller phrase technology. Any transcriptional errors that result from this process are unintentional.

## 2018-11-22 NOTE — Telephone Encounter (Signed)
Called pt regarding scheduling her for a EGD/Colonoscopy procedure. Unable to contact. No VM set up  Contacted pt grandson, Einar Pheasant, emergency contact, and informed him that we are trying to reach Mrs. Ibsen. He stated he will try to contact her to have her call our office.

## 2018-11-22 NOTE — Telephone Encounter (Addendum)
Spoke with pt to discuss scheduling her EGD / colonoscopy procedure. Pt has agreed to schedule procedure on 11-24-18. I verbally explained the prior prep instructions and informed her that she would need stop by our office to pick up the printed instructions. Pt plans to come by the office tomorrow. Pt is aware that she will need to begin the clear liquid diet tomorrow morning. Pt is also aware not to resume plavix or vitamins until directed to do so.

## 2018-11-22 NOTE — Telephone Encounter (Signed)
-----   Message from Jonathon Bellows, MD sent at 11/21/2018  1:54 PM EST ----- Regarding: schedule procedyres for wednesday on my schedule - please confirm Joanne Brander/Michelle on Monday need to contact this patient and schedule for EGD+colonoscpopy and possible capsule study on my schedule - we discharged her from hospital - she is off plavix waiting for procedures  Please confirm   Kiran

## 2018-11-23 ENCOUNTER — Telehealth: Payer: Self-pay | Admitting: Gastroenterology

## 2018-11-23 ENCOUNTER — Telehealth: Payer: Self-pay | Admitting: *Deleted

## 2018-11-23 ENCOUNTER — Encounter: Payer: Self-pay | Admitting: *Deleted

## 2018-11-23 NOTE — Telephone Encounter (Signed)
Pt is calling to reschedule her Procedure due to the flu. She states she is not paying $100 rescheduling fee.

## 2018-11-23 NOTE — Telephone Encounter (Signed)
Attempted to contact patient r/t LDCT Screening follow up due at this time. Attempted to contact patient r/t LDCT Screening follow up due at this time.  No answer received, unable to leave message at this time, letter sent.

## 2018-11-24 ENCOUNTER — Encounter: Admission: RE | Payer: Self-pay | Source: Ambulatory Visit

## 2018-11-24 ENCOUNTER — Ambulatory Visit: Admission: RE | Admit: 2018-11-24 | Payer: Medicare Other | Source: Ambulatory Visit | Admitting: Gastroenterology

## 2018-11-24 SURGERY — COLONOSCOPY WITH PROPOFOL
Anesthesia: General

## 2018-11-25 NOTE — Telephone Encounter (Signed)
Called pt several times regarding her endoscopy procedure and contacting her primary provider to for instructions on resuming her blood thinner. Unable to contact via phone. No VM Will inform pt pcp.

## 2018-11-26 ENCOUNTER — Telehealth: Payer: Self-pay

## 2018-11-26 NOTE — Telephone Encounter (Signed)
Calling to reschedule upper and lower procedure. Pt experienced flu symptoms earlier this week. Previous appt was cancelled.

## 2019-02-11 ENCOUNTER — Emergency Department: Payer: Medicare Other

## 2019-02-11 ENCOUNTER — Emergency Department
Admission: EM | Admit: 2019-02-11 | Discharge: 2019-02-11 | Disposition: A | Discharge status: Home / Self Care | Payer: Medicare Other | Attending: Emergency Medicine | Admitting: Emergency Medicine

## 2019-02-11 ENCOUNTER — Other Ambulatory Visit: Payer: Self-pay

## 2019-02-11 ENCOUNTER — Encounter: Payer: Self-pay | Admitting: Emergency Medicine

## 2019-02-11 DIAGNOSIS — F1210 Cannabis abuse, uncomplicated: Secondary | ICD-10-CM | POA: Insufficient documentation

## 2019-02-11 DIAGNOSIS — J449 Chronic obstructive pulmonary disease, unspecified: Secondary | ICD-10-CM | POA: Diagnosis not present

## 2019-02-11 DIAGNOSIS — Z79899 Other long term (current) drug therapy: Secondary | ICD-10-CM | POA: Insufficient documentation

## 2019-02-11 DIAGNOSIS — K029 Dental caries, unspecified: Secondary | ICD-10-CM | POA: Diagnosis not present

## 2019-02-11 DIAGNOSIS — R0789 Other chest pain: Secondary | ICD-10-CM | POA: Diagnosis not present

## 2019-02-11 DIAGNOSIS — F17210 Nicotine dependence, cigarettes, uncomplicated: Secondary | ICD-10-CM | POA: Diagnosis not present

## 2019-02-11 DIAGNOSIS — I10 Essential (primary) hypertension: Secondary | ICD-10-CM | POA: Diagnosis not present

## 2019-02-11 DIAGNOSIS — K0889 Other specified disorders of teeth and supporting structures: Secondary | ICD-10-CM | POA: Diagnosis present

## 2019-02-11 LAB — CBC
HCT: 35.9 % — ABNORMAL LOW (ref 36.0–46.0)
Hemoglobin: 11 g/dL — ABNORMAL LOW (ref 12.0–15.0)
MCH: 25.2 pg — ABNORMAL LOW (ref 26.0–34.0)
MCHC: 30.6 g/dL (ref 30.0–36.0)
MCV: 82.2 fL (ref 80.0–100.0)
Platelets: 244 10*3/uL (ref 150–400)
RBC: 4.37 MIL/uL (ref 3.87–5.11)
RDW: 23.3 % — ABNORMAL HIGH (ref 11.5–15.5)
WBC: 6.3 10*3/uL (ref 4.0–10.5)
nRBC: 0 % (ref 0.0–0.2)

## 2019-02-11 LAB — BASIC METABOLIC PANEL
Anion gap: 10 (ref 5–15)
BUN: 10 mg/dL (ref 8–23)
CALCIUM: 9.1 mg/dL (ref 8.9–10.3)
CO2: 24 mmol/L (ref 22–32)
Chloride: 103 mmol/L (ref 98–111)
Creatinine, Ser: 0.77 mg/dL (ref 0.44–1.00)
GFR calc Af Amer: >60 mL/min (ref >60)
GFR calc non Af Amer: >60 mL/min (ref >60)
Glucose, Bld: 88 mg/dL (ref 70–99)
Potassium: 3.8 mmol/L (ref 3.5–5.1)
Sodium: 137 mmol/L (ref 135–145)

## 2019-02-11 LAB — TROPONIN I: Troponin I: <0.03 ng/mL (ref <0.03)

## 2019-02-11 MED ORDER — BENZOCAINE 10 % MT GEL: 1.0000 "application " | OROMUCOSAL | 0 refills | Status: DC | PRN

## 2019-02-11 MED ORDER — TRAMADOL HCL 50 MG PO TABS
50.0000 mg | ORAL_TABLET | Freq: Once | ORAL | Status: AC
  Administered 2019-02-11: 50 mg via ORAL
  Filled 2019-02-11: qty 1

## 2019-02-11 MED ORDER — SODIUM CHLORIDE 0.9% FLUSH: 3.0000 mL | Freq: Once | INTRAVENOUS | Status: DC

## 2019-02-11 MED ORDER — ACETAMINOPHEN 325 MG PO TABS: 975.0000 mg | ORAL_TABLET | Freq: Three times a day (TID) | ORAL | 0 refills | Status: DC | PRN

## 2019-02-11 NOTE — ED Triage Notes (Signed)
Says bad tooth ache.  At firtst did not want chest pain checked, but then decided to have it checked.

## 2019-02-11 NOTE — ED Notes (Signed)
Pt discharged in stable condition per attending MD. Discharge instruction reviewed patient stated understanding. No questions or concerns this time.

## 2019-02-11 NOTE — ED Provider Notes (Signed)
[facility] Emergency Department Provider Note  ____________________________________________  Time seen: Approximately 4:07 PM  I have reviewed the triage vital signs and the nursing notes.   HISTORY  Chief Complaint Chest Pain and Dental Problem    HPI Traci Duran is a 63 y.o. female with a history of anxiety COPD GERD hypertension and CAD who comes the ED due to a bad toothache for the past few days.  She has a left lower molar which feels loose.  She is worried it might be infected.  She also has a right upper tooth that is significantly decayed.  Denies trouble breathing or swallowing, no fevers or chills or throat pain.  No facial swelling.  Pain is waxing and waning, no aggravating or alleviating factors.  Nonradiating.    She also reports last night having 2 brief episodes of chest discomfort, described as sharp, lasting a few seconds at a time, nonradiating, not associated with diaphoresis vomiting or shortness of breath.  Not exertional, not pleuritic, occurred at rest, felt like acid reflux.  Past Medical History:  Diagnosis Date  . Anemia   . Anxiety   . COPD (chronic obstructive pulmonary disease) ([place])   . Coronary artery disease   . Depression   . Gastritis   . GERD (gastroesophageal reflux disease)   . Hemorrhoids   . Hyperlipidemia   . Hypertension   . PAD (peripheral artery disease) [facility])      Patient Active Problem List   Diagnosis Date Noted  . Rectal bleed 11/19/2018  . 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 Benzodiazepine abuse ([place]) 09/03/2018  . Moderate recurrent major depression ([place]) 09/03/2018  . Hyponatremia 10/11/2017  . Chest pain 07/28/2017  . Acute blood loss anemia 07/28/2017  . Demand ischemia ([place])   . Benign neoplasm of descending colon   . Polyp of sigmoid colon   . Age related osteoporosis 04/16/2017  . Iron deficiency anemia secondary to blood loss (chronic)   . GI bleed 04/06/2017  .  Diastolic dysfunction 56/43/3295  . Opioid contract exists 06/27/2016  . Incidental lung nodule 01/18/2016  . 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 Hypovitaminosis D 08/29/2015  . Thyroid lesion 08/29/2015  . Major depressive disorder, single episode, unspecified 08/27/2015  . 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 Gastroesophageal reflux disease 01/26/2015  . Osteopenia 01/26/2015  . Anemia, unspecified 12/27/2014  . History of fall 12/27/2014  . Greater tuberosity of humerus fracture 10/24/2013  . AVF (arteriovenous fistula) ([place]) 11/08/2011  . Anxiety 10/24/2011  . Cerebrovascular disease 10/24/2011  . Dyslipidemia 10/24/2011  . Hypertension 10/24/2011  . Tobacco abuse 10/24/2011  . Palpitations 10/24/2011  . Renal artery stenosis ([place]) 10/24/2011  . Unstable angina ([place]) 10/14/2011  . Hyperlipidemia   . Chronic obstructive pulmonary disease ([place])   . Depression   . Peripheral arterial disease ([place])   . Coronary artery disease, non-occlusive      Past Surgical History:  Procedure Laterality Date  . APPENDECTOMY    . COLONOSCOPY WITH PROPOFOL N/A 06/09/2017   Procedure: COLONOSCOPY WITH PROPOFOL;  Surgeon: Lucilla Lame, MD;  Location: [facility] ENDOSCOPY;  Service: Endoscopy;  Laterality: N/A;  . ESOPHAGOGASTRODUODENOSCOPY N/A 07/30/2017   Procedure: ESOPHAGOGASTRODUODENOSCOPY (EGD);  Surgeon: Lin Landsman, MD;  Location: [facility] ENDOSCOPY;  Service: Gastroenterology;  Laterality: N/A;  . ESOPHAGOGASTRODUODENOSCOPY (EGD) WITH PROPOFOL N/A 04/09/2017   Procedure: ESOPHAGOGASTRODUODENOSCOPY (EGD) WITH PROPOFOL;  Surgeon: Lucilla Lame, MD;  Location: [facility] ENDOSCOPY;  Service: Endoscopy;  Laterality: N/A;  . HIP FRACTURE SURGERY       Prior to Admission medications   Medication Sig Start Date End Date Taking? Authorizing Provider  acetaminophen (TYLENOL) 325 MG tablet Take 3 tablets (975 mg total) by mouth 3 (three) times daily as needed. 02/11/19   Carrie Mew, MD  albuterol (PROVENTIL HFA;VENTOLIN HFA) 108 [phone] Base) MCG/ACT inhaler Inhale 2 puffs into the lungs every 6 (six) hours as needed for wheezing or shortness of breath. 04/02/18   Merlyn Lot, MD  alprazolam Duanne Moron) 2 MG tablet Take 2 mg by mouth 2 (two) times daily.     [provider]  atorvastatin (LIPITOR) 80 MG tablet Take 80 mg by mouth daily.    [provider]  benzocaine (ORAJEL) 10 % mucosal gel Use as directed 1 application in the mouth or throat as needed for mouth pain. 02/11/19   Carrie Mew, MD  betamethasone valerate ointment (VALISONE) 0.1 % Apply 1 application topically 2 (two) times daily. 08/19/18   [provider]  busPIRone (BUSPAR) 10 MG tablet Take 10 mg by mouth 2 (two) times daily.     [provider]  carvedilol (COREG) 6.25 MG tablet Take 6.25 mg by mouth 2 (two) times daily. 05/28/17   [provider]  cetirizine (ZYRTEC) 10 MG tablet Take 10 mg by mouth daily. 08/12/18   [provider]  citalopram (CELEXA) 40 MG tablet Take 40 mg by mouth daily.    [provider]  DULoxetine (CYMBALTA) 30 MG capsule Take 30 mg by mouth daily. 05/28/17   [provider]  ferrous sulfate 325 (65 FE) MG tablet Take 1 tablet (325 mg total) by mouth 3 (three) times daily with meals. Patient not taking: Reported on 11/19/2018 07/30/17   Fritzi Mandes, MD  gabapentin (NEURONTIN) 300 MG capsule Take 300 mg by mouth 3 (three) times daily.     [provider]  lisinopril (PRINIVIL,ZESTRIL) 20 MG tablet Take 20 mg by mouth daily.      [provider]  Multiple Vitamin (MULTIVITAMIN WITH MINERALS) TABS tablet Take 2 tablets by mouth daily.    [provider]  nitroGLYCERIN (NITROSTAT) 0.4 MG SL tablet Place 0.4 mg under the tongue every 5 (five) minutes as needed.     [provider]  pantoprazole (PROTONIX) 20 MG tablet Take 20 mg by mouth daily.    [provider]   pregabalin (LYRICA) 150 MG capsule Take 150 mg by mouth 2 (two) times daily. 11/18/18 11/18/19  [provider]  SYMBICORT 80-4.5 MCG/ACT inhaler Inhale 2 puffs into the lungs 2 (two) times daily. 08/12/18   [provider]  traMADol (ULTRAM) 50 MG tablet Take 1 tablet (50 mg total) by mouth 3 (three) times daily as needed for moderate pain. 11/21/18   Gladstone Lighter, MD  vitamin B-12 (CYANOCOBALAMIN) 500 MCG tablet Take 1 tablet (500 mcg total) by mouth daily. 11/21/18   Gladstone Lighter, MD  Vitamin D, Ergocalciferol, (DRISDOL) 50000 units CAPS capsule Take 50,000 Units by mouth every Saturday.  05/01/17   [provider]     Allergies Chantix [varenicline]; Diphenhydramine hcl; Benadryl [diphenhydramine hcl]; Niacin; and Trazodone   Family History  Problem Relation Age of Onset  . Heart failure Mother   . Heart attack Mother        mother died at 35 of an MI  . Heart attack Father        father died at 29 of an  MI  . Heart attack Brother        Brother had an MI in his 69s    Social History Social History   Tobacco Use  . Smoking status: Current Some Day Smoker    Packs/day: 1.00    Years: 46.00    Pack years: 46.00    Types: Cigarettes  . Smokeless tobacco: Never Used  . Tobacco comment: she feels that the 21 nicotine made her jittery and we suggested a lower level patch  Substance Use Topics  . Alcohol use: No  . Drug use: Yes    Types: Marijuana    Comment: monthly    Review of Systems  Constitutional:   No fever or chills.  ENT: Positive dental pain as above Cardiovascular: Positive as above chest discomfort without syncope. Respiratory:   No dyspnea or cough. Gastrointestinal:   Negative for abdominal pain, vomiting and diarrhea.  Musculoskeletal:   Negative for focal pain or swelling All other systems reviewed and are negative except as documented above in ROS and  HPI.  ____________________________________________   PHYSICAL EXAM:  VITAL SIGNS: ED Triage Vitals  Enc Vitals Group     BP 02/11/19 1401 129/76     Pulse Rate 02/11/19 1401 71     Resp 02/11/19 1401 16     Temp 02/11/19 1401 97.7 F (36.5 C)     Temp Source 02/11/19 1401 Oral     SpO2 02/11/19 1401 99 %     Weight 02/11/19 1402 140 lb (63.5 kg)     Height 02/11/19 1402 5\' 7"  (1.702 m)     Head Circumference --      Peak Flow --      Pain Score 02/11/19 1402 10     Pain Loc --      Pain Edu? --      Excl. in [place]? --     Vital signs reviewed, nursing assessments reviewed.   Constitutional:   Alert and oriented. Non-toxic appearance. Eyes:   Conjunctivae are normal. EOMI. PERRL. ENT      Head:   Normocephalic and atraumatic.      Nose:   No congestion/rhinnorhea.       Mouth/Throat:   MMM, no pharyngeal erythema. No peritonsillar mass.  Left lower molar is severely decayed with recessed gums.  No gingival swelling or tenderness, no fluctuance or purulent drainage.  No elevation of the tongue or floor of mouth swelling.  There is also a left upper tooth, lateral incisor or canine which is severely decayed as well.  Surrounding gums are normal.      Neck:   No meningismus. Full ROM. Hematological/Lymphatic/Immunilogical:   No cervical lymphadenopathy. Cardiovascular:   RRR. Symmetric bilateral radial and DP pulses.  No murmurs. Cap refill less than 2 seconds. Respiratory:   Normal respiratory effort without tachypnea/retractions. Breath sounds are clear and equal bilaterally. No wheezes/rales/rhonchi. Gastrointestinal:   Soft and nontender. Non distended. There is no CVA tenderness.  No rebound, rigidity, or guarding. Musculoskeletal:   Normal range of motion in all extremities. No joint effusions.  No lower extremity tenderness.  No edema. Neurologic:   Normal speech and language.  Motor grossly intact. No acute focal neurologic deficits are appreciated.  Skin:    Skin is  warm, dry and intact. No rash noted.  No petechiae, purpura, or bullae.  ____________________________________________    LABS (pertinent positives/negatives) (all labs ordered are listed, but only abnormal results are displayed) Labs Reviewed  CBC -  Abnormal; Notable for the following components:      Result Value   Hemoglobin 11.0 (*)    HCT 35.9 (*)    MCH 25.2 (*)    RDW 23.3 (*)    All other components within normal limits  BASIC METABOLIC PANEL  TROPONIN I   ____________________________________________   EKG  Interpreted by me Normal sinus rhythm rate of 71, normal axis intervals QRS ST segments and T waves  ____________________________________________    RADIOLOGY  Dg Chest 2 View  Result Date: 02/11/2019 CLINICAL DATA:  Chest pain. EXAM: CHEST - 2 VIEW COMPARISON:  09/03/2018 FINDINGS: The heart size and pulmonary vascularity are normal. Lungs are clear. Aortic atherosclerosis. Bones are normal. IMPRESSION: No acute abnormality. Aortic Atherosclerosis (ICD10-I70.0). Electronically Signed   By: Lorriane Shire M.D.   On: 02/11/2019 14:49    ____________________________________________   PROCEDURES Procedures  ____________________________________________    CLINICAL IMPRESSION / ASSESSMENT AND PLAN / ED COURSE  Medications ordered in the ED: Medications  sodium chloride flush (NS) 0.9 % injection 3 mL ( Intravenous Canceled Entry 02/11/19 1500)  traMADol (ULTRAM) tablet 50 mg (has no administration in time range)    Pertinent labs & imaging results that were available during my care of the patient were reviewed by me and considered in my medical decision making (see chart for details).    Patient presents with dental pain.  No evidence of abscess or secondary space infection.  Antibiotics not indicated at this time.  Vital signs are normal, labs unremarkable, EKG is normal.  Troponin ordered by triage protocol, but symptoms are not concerning for ACS and  cardiac work-up is not indicated at this time.  Refer patient to primary care as well as dentistry for extraction of diseased teeth.  Recommend Orajel and Tylenol.  I will give her a tramadol here in the ED prior to discharge but no opioid prescription.      ____________________________________________   FINAL CLINICAL IMPRESSION(S) / ED DIAGNOSES    Final diagnoses:  Pain due to dental caries     ED Discharge Orders         Ordered    benzocaine (ORAJEL) 10 % mucosal gel  As needed     02/11/19 1607    acetaminophen (TYLENOL) 325 MG tablet  3 times daily PRN     02/11/19 1607          Portions of this note were generated with dragon dictation software. Dictation errors may occur despite best attempts at proofreading.   Carrie Mew, MD 02/11/19 [phone]

## 2019-05-16 ENCOUNTER — Inpatient Hospital Stay
Admission: EM | Admit: 2019-05-16 | Discharge: 2019-05-18 | DRG: 291 | Disposition: A | Discharge status: Home / Self Care | Payer: Medicare Other | Attending: Internal Medicine | Admitting: Internal Medicine

## 2019-05-16 ENCOUNTER — Encounter: Payer: Self-pay | Admitting: Emergency Medicine

## 2019-05-16 ENCOUNTER — Emergency Department: Payer: Medicare Other

## 2019-05-16 DIAGNOSIS — K219 Gastro-esophageal reflux disease without esophagitis: Secondary | ICD-10-CM | POA: Diagnosis present

## 2019-05-16 DIAGNOSIS — I509 Heart failure, unspecified: Secondary | ICD-10-CM

## 2019-05-16 DIAGNOSIS — J441 Chronic obstructive pulmonary disease with (acute) exacerbation: Secondary | ICD-10-CM | POA: Diagnosis present

## 2019-05-16 DIAGNOSIS — E785 Hyperlipidemia, unspecified: Secondary | ICD-10-CM | POA: Diagnosis present

## 2019-05-16 DIAGNOSIS — I5031 Acute diastolic (congestive) heart failure: Secondary | ICD-10-CM | POA: Diagnosis not present

## 2019-05-16 DIAGNOSIS — Z79899 Other long term (current) drug therapy: Secondary | ICD-10-CM

## 2019-05-16 DIAGNOSIS — E876 Hypokalemia: Secondary | ICD-10-CM | POA: Diagnosis present

## 2019-05-16 DIAGNOSIS — J9601 Acute respiratory failure with hypoxia: Secondary | ICD-10-CM | POA: Diagnosis present

## 2019-05-16 DIAGNOSIS — D649 Anemia, unspecified: Secondary | ICD-10-CM | POA: Diagnosis present

## 2019-05-16 DIAGNOSIS — F329 Major depressive disorder, single episode, unspecified: Secondary | ICD-10-CM | POA: Diagnosis present

## 2019-05-16 DIAGNOSIS — I2510 Atherosclerotic heart disease of native coronary artery without angina pectoris: Secondary | ICD-10-CM | POA: Diagnosis present

## 2019-05-16 DIAGNOSIS — Z888 Allergy status to other drugs, medicaments and biological substances status: Secondary | ICD-10-CM | POA: Diagnosis not present

## 2019-05-16 DIAGNOSIS — Z7951 Long term (current) use of inhaled steroids: Secondary | ICD-10-CM

## 2019-05-16 DIAGNOSIS — I110 Hypertensive heart disease with heart failure: Principal | ICD-10-CM | POA: Diagnosis present

## 2019-05-16 DIAGNOSIS — I5033 Acute on chronic diastolic (congestive) heart failure: Secondary | ICD-10-CM | POA: Diagnosis present

## 2019-05-16 DIAGNOSIS — F419 Anxiety disorder, unspecified: Secondary | ICD-10-CM | POA: Diagnosis present

## 2019-05-16 DIAGNOSIS — F17210 Nicotine dependence, cigarettes, uncomplicated: Secondary | ICD-10-CM | POA: Diagnosis present

## 2019-05-16 DIAGNOSIS — Z8249 Family history of ischemic heart disease and other diseases of the circulatory system: Secondary | ICD-10-CM | POA: Diagnosis not present

## 2019-05-16 DIAGNOSIS — Z1159 Encounter for screening for other viral diseases: Secondary | ICD-10-CM | POA: Diagnosis not present

## 2019-05-16 DIAGNOSIS — I739 Peripheral vascular disease, unspecified: Secondary | ICD-10-CM | POA: Diagnosis present

## 2019-05-16 DIAGNOSIS — I5032 Chronic diastolic (congestive) heart failure: Secondary | ICD-10-CM

## 2019-05-16 DIAGNOSIS — Z79891 Long term (current) use of opiate analgesic: Secondary | ICD-10-CM

## 2019-05-16 DIAGNOSIS — I340 Nonrheumatic mitral (valve) insufficiency: Secondary | ICD-10-CM | POA: Diagnosis not present

## 2019-05-16 LAB — CBC WITH DIFFERENTIAL/PLATELET
Abs Immature Granulocytes: 0.03 10*3/uL (ref 0.00–0.07)
Basophils Absolute: 0.1 10*3/uL (ref 0.0–0.1)
Basophils Relative: 1 %
Eosinophils Absolute: 0.2 10*3/uL (ref 0.0–0.5)
Eosinophils Relative: 2 %
HCT: 32.1 % — ABNORMAL LOW (ref 36.0–46.0)
Hemoglobin: 9.7 g/dL — ABNORMAL LOW (ref 12.0–15.0)
Immature Granulocytes: 0 %
Lymphocytes Relative: 31 %
Lymphs Abs: 2.9 10*3/uL (ref 0.7–4.0)
MCH: 24.6 pg — ABNORMAL LOW (ref 26.0–34.0)
MCHC: 30.2 g/dL (ref 30.0–36.0)
MCV: 81.3 fL (ref 80.0–100.0)
Monocytes Absolute: 0.7 10*3/uL (ref 0.1–1.0)
Monocytes Relative: 7 %
Neutro Abs: 5.4 10*3/uL (ref 1.7–7.7)
Neutrophils Relative %: 59 %
Platelets: 273 10*3/uL (ref 150–400)
RBC: 3.95 MIL/uL (ref 3.87–5.11)
RDW: 14.9 % (ref 11.5–15.5)
WBC: 9.3 10*3/uL (ref 4.0–10.5)
nRBC: 0 % (ref 0.0–0.2)

## 2019-05-16 LAB — COMPREHENSIVE METABOLIC PANEL
ALT: 14 U/L (ref 0–44)
AST: 18 U/L (ref 15–41)
Albumin: 3.6 g/dL (ref 3.5–5.0)
Alkaline Phosphatase: 93 U/L (ref 38–126)
Anion gap: 8 (ref 5–15)
BUN: 9 mg/dL (ref 8–23)
CO2: 22 mmol/L (ref 22–32)
Calcium: 8.5 mg/dL — ABNORMAL LOW (ref 8.9–10.3)
Chloride: 111 mmol/L (ref 98–111)
Creatinine, Ser: 0.64 mg/dL (ref 0.44–1.00)
GFR calc Af Amer: >60 mL/min (ref >60)
GFR calc non Af Amer: >60 mL/min (ref >60)
Glucose, Bld: 101 mg/dL — ABNORMAL HIGH (ref 70–99)
Potassium: 3.1 mmol/L — ABNORMAL LOW (ref 3.5–5.1)
Sodium: 141 mmol/L (ref 135–145)
Total Bilirubin: 0.6 mg/dL (ref 0.3–1.2)
Total Protein: 6.7 g/dL (ref 6.5–8.1)

## 2019-05-16 LAB — BLOOD GAS, VENOUS
Acid-base deficit: 3 mmol/L — ABNORMAL HIGH (ref 0.0–2.0)
Bicarbonate: 22.7 mmol/L (ref 20.0–28.0)
O2 Saturation: 84 %
Patient temperature: 37
pCO2, Ven: 42 mmHg — ABNORMAL LOW (ref 44.0–60.0)
pH, Ven: 7.34 (ref 7.250–7.430)
pO2, Ven: 52 mmHg — ABNORMAL HIGH (ref 32.0–45.0)

## 2019-05-16 LAB — BRAIN NATRIURETIC PEPTIDE: B Natriuretic Peptide: 260 pg/mL — ABNORMAL HIGH (ref 0.0–100.0)

## 2019-05-16 LAB — LACTIC ACID, PLASMA: Lactic Acid, Venous: 1 mmol/L (ref 0.5–1.9)

## 2019-05-16 LAB — SARS CORONAVIRUS 2 BY RT PCR (HOSPITAL ORDER, PERFORMED IN ~~LOC~~ HOSPITAL LAB): SARS Coronavirus 2: NEGATIVE

## 2019-05-16 LAB — PROCALCITONIN: Procalcitonin: <0.1 ng/mL

## 2019-05-16 LAB — TROPONIN I: Troponin I: <0.03 ng/mL (ref <0.03)

## 2019-05-16 MED ORDER — GUAIFENESIN ER 600 MG PO TB12
600.0000 mg | ORAL_TABLET | Freq: Two times a day (BID) | ORAL | Status: DC
  Administered 2019-05-17 – 2019-05-18 (×3): 600 mg via ORAL
  Filled 2019-05-16 (×3): qty 1

## 2019-05-16 MED ORDER — VITAMIN D (ERGOCALCIFEROL) 1.25 MG (50000 UNIT) PO CAPS: 50000.0000 [IU] | ORAL_CAPSULE | ORAL | Status: DC

## 2019-05-16 MED ORDER — LORATADINE 10 MG PO TABS
10.0000 mg | ORAL_TABLET | Freq: Every day | ORAL | Status: DC
  Administered 2019-05-17 – 2019-05-18 (×2): 10 mg via ORAL
  Filled 2019-05-16 (×2): qty 1

## 2019-05-16 MED ORDER — FERROUS SULFATE 325 (65 FE) MG PO TABS
325.0000 mg | ORAL_TABLET | Freq: Three times a day (TID) | ORAL | Status: DC
  Administered 2019-05-17 – 2019-05-18 (×3): 325 mg via ORAL
  Filled 2019-05-16 (×4): qty 1

## 2019-05-16 MED ORDER — ACETAMINOPHEN 650 MG RE SUPP: 650.0000 mg | Freq: Four times a day (QID) | RECTAL | Status: DC | PRN

## 2019-05-16 MED ORDER — DULOXETINE HCL 30 MG PO CPEP
30.0000 mg | ORAL_CAPSULE | Freq: Every day | ORAL | Status: DC
  Administered 2019-05-17 – 2019-05-18 (×2): 30 mg via ORAL
  Filled 2019-05-16 (×2): qty 1

## 2019-05-16 MED ORDER — TRAMADOL HCL 50 MG PO TABS
50.0000 mg | ORAL_TABLET | Freq: Three times a day (TID) | ORAL | Status: DC | PRN
  Administered 2019-05-17 – 2019-05-18 (×4): 50 mg via ORAL
  Filled 2019-05-16 (×4): qty 1

## 2019-05-16 MED ORDER — ACETAMINOPHEN 325 MG PO TABS
650.0000 mg | ORAL_TABLET | Freq: Four times a day (QID) | ORAL | Status: DC | PRN
  Administered 2019-05-17 (×2): 650 mg via ORAL
  Filled 2019-05-16 (×2): qty 2

## 2019-05-16 MED ORDER — ENOXAPARIN SODIUM 40 MG/0.4ML ~~LOC~~ SOLN
40.0000 mg | SUBCUTANEOUS | Status: DC
  Administered 2019-05-17 – 2019-05-18 (×2): 40 mg via SUBCUTANEOUS
  Filled 2019-05-16 (×2): qty 0.4

## 2019-05-16 MED ORDER — CARVEDILOL 6.25 MG PO TABS
6.2500 mg | ORAL_TABLET | Freq: Two times a day (BID) | ORAL | Status: DC
  Administered 2019-05-17 – 2019-05-18 (×4): 6.25 mg via ORAL
  Filled 2019-05-16 (×4): qty 1

## 2019-05-16 MED ORDER — ALPRAZOLAM 1 MG PO TABS
2.0000 mg | ORAL_TABLET | Freq: Two times a day (BID) | ORAL | Status: DC
  Administered 2019-05-17 – 2019-05-18 (×4): 2 mg via ORAL
  Filled 2019-05-16 (×4): qty 2

## 2019-05-16 MED ORDER — VITAMIN B-12 1000 MCG PO TABS
500.0000 ug | ORAL_TABLET | Freq: Every day | ORAL | Status: DC
  Administered 2019-05-17 – 2019-05-18 (×2): 500 ug via ORAL
  Filled 2019-05-16 (×2): qty 1

## 2019-05-16 MED ORDER — ADULT MULTIVITAMIN W/MINERALS CH
1.0000 | ORAL_TABLET | Freq: Every day | ORAL | Status: DC
  Administered 2019-05-17 – 2019-05-18 (×2): 1 via ORAL
  Filled 2019-05-16 (×2): qty 1

## 2019-05-16 MED ORDER — ATORVASTATIN CALCIUM 20 MG PO TABS
80.0000 mg | ORAL_TABLET | Freq: Every day | ORAL | Status: DC
  Administered 2019-05-17 – 2019-05-18 (×2): 80 mg via ORAL
  Filled 2019-05-16 (×2): qty 4

## 2019-05-16 MED ORDER — BENZOCAINE 10 % MT GEL
1.0000 "application " | OROMUCOSAL | Status: DC | PRN
  Filled 2019-05-16: qty 9

## 2019-05-16 MED ORDER — ONDANSETRON HCL 4 MG PO TABS: 4.0000 mg | ORAL_TABLET | Freq: Four times a day (QID) | ORAL | Status: DC | PRN

## 2019-05-16 MED ORDER — BUSPIRONE HCL 10 MG PO TABS
10.0000 mg | ORAL_TABLET | Freq: Two times a day (BID) | ORAL | Status: DC
  Administered 2019-05-17 – 2019-05-18 (×3): 10 mg via ORAL
  Filled 2019-05-16 (×4): qty 1

## 2019-05-16 MED ORDER — PREGABALIN 75 MG PO CAPS
150.0000 mg | ORAL_CAPSULE | Freq: Two times a day (BID) | ORAL | Status: DC
  Administered 2019-05-17 – 2019-05-18 (×4): 150 mg via ORAL
  Filled 2019-05-16 (×4): qty 2

## 2019-05-16 MED ORDER — CITALOPRAM HYDROBROMIDE 20 MG PO TABS
40.0000 mg | ORAL_TABLET | Freq: Every day | ORAL | Status: DC
  Administered 2019-05-17 – 2019-05-18 (×2): 40 mg via ORAL
  Filled 2019-05-16 (×2): qty 2

## 2019-05-16 MED ORDER — SODIUM CHLORIDE 0.9 % IV SOLN
1.0000 g | INTRAVENOUS | Status: DC
  Administered 2019-05-17: 1 g via INTRAVENOUS
  Filled 2019-05-16: qty 10

## 2019-05-16 MED ORDER — GABAPENTIN 300 MG PO CAPS
300.0000 mg | ORAL_CAPSULE | Freq: Three times a day (TID) | ORAL | Status: DC
  Administered 2019-05-17 – 2019-05-18 (×4): 300 mg via ORAL
  Filled 2019-05-16 (×4): qty 1

## 2019-05-16 MED ORDER — ONDANSETRON HCL 4 MG/2ML IJ SOLN: 4.0000 mg | Freq: Four times a day (QID) | INTRAMUSCULAR | Status: DC | PRN

## 2019-05-16 MED ORDER — PANTOPRAZOLE SODIUM 20 MG PO TBEC
20.0000 mg | DELAYED_RELEASE_TABLET | Freq: Every day | ORAL | Status: DC
  Administered 2019-05-17 – 2019-05-18 (×2): 20 mg via ORAL
  Filled 2019-05-16 (×2): qty 1

## 2019-05-16 MED ORDER — FUROSEMIDE 10 MG/ML IJ SOLN
60.0000 mg | Freq: Once | INTRAMUSCULAR | Status: AC
  Administered 2019-05-16: 60 mg via INTRAVENOUS
  Filled 2019-05-16: qty 8

## 2019-05-16 MED ORDER — IPRATROPIUM-ALBUTEROL 0.5-2.5 (3) MG/3ML IN SOLN
3.0000 mL | Freq: Four times a day (QID) | RESPIRATORY_TRACT | Status: DC
  Administered 2019-05-17 – 2019-05-18 (×6): 3 mL via RESPIRATORY_TRACT
  Filled 2019-05-16 (×6): qty 3

## 2019-05-16 MED ORDER — LISINOPRIL 20 MG PO TABS
20.0000 mg | ORAL_TABLET | Freq: Every day | ORAL | Status: DC
  Administered 2019-05-17 – 2019-05-18 (×2): 20 mg via ORAL
  Filled 2019-05-16 (×2): qty 1

## 2019-05-16 MED ORDER — NITROGLYCERIN 0.4 MG SL SUBL: 0.4000 mg | SUBLINGUAL_TABLET | SUBLINGUAL | Status: DC | PRN

## 2019-05-16 MED ORDER — TRIAMCINOLONE ACETONIDE 0.1 % EX CREA: TOPICAL_CREAM | Freq: Two times a day (BID) | CUTANEOUS | Status: DC

## 2019-05-16 MED ORDER — MAGNESIUM HYDROXIDE 400 MG/5ML PO SUSP: 30.0000 mL | Freq: Every day | ORAL | Status: DC | PRN

## 2019-05-16 MED ORDER — IPRATROPIUM-ALBUTEROL 0.5-2.5 (3) MG/3ML IN SOLN
3.0000 mL | Freq: Once | RESPIRATORY_TRACT | Status: AC
  Administered 2019-05-16: 3 mL via RESPIRATORY_TRACT
  Filled 2019-05-16: qty 3

## 2019-05-16 NOTE — ED Notes (Signed)
Pt is currently off bipap and is now on 2L of O2. Pt sats are 95%

## 2019-05-16 NOTE — ED Provider Notes (Signed)
[facility] Emergency Department Provider Note    First MD Initiated Contact with Patient 05/16/19 2124     (approximate)  I have reviewed the triage vital signs and the nursing notes.   HISTORY  Chief Complaint Respiratory Distress    HPI Traci Duran is a 63 y.o. female presents the ER with shortness of breath.  Patient with a history of COPD does hypertension and CAD.  Has a reported history of heart failure.  Complaining of 24 hours worsening shortness of breath.  No chest pains.  Does not wear home oxygen.  Was found patient be hypoxic in the 14s.  Was given nebs Solu-Medrol and placed on CPAP.    Past Medical History:  Diagnosis Date  . Anemia   . Anxiety   . COPD (chronic obstructive pulmonary disease) ([place])   . Coronary artery disease   . Depression   . Gastritis   . GERD (gastroesophageal reflux disease)   . Hemorrhoids   . Hyperlipidemia   . Hypertension   . PAD (peripheral artery disease) (HCC)    Family History  Problem Relation Age of Onset  . Heart failure Mother   . Heart attack Mother        mother died at 11 of an MI  . Heart attack Father        father died at 41 of an MI  . Heart attack Brother        Brother had an MI in his 45s   Past Surgical History:  Procedure Laterality Date  . APPENDECTOMY    . COLONOSCOPY WITH PROPOFOL N/A 06/09/2017   Procedure: COLONOSCOPY WITH PROPOFOL;  Surgeon: Lucilla Lame, MD;  Location: [facility] ENDOSCOPY;  Service: Endoscopy;  Laterality: N/A;  . ESOPHAGOGASTRODUODENOSCOPY N/A 07/30/2017   Procedure: ESOPHAGOGASTRODUODENOSCOPY (EGD);  Surgeon: Lin Landsman, MD;  Location: [facility] ENDOSCOPY;  Service: Gastroenterology;  Laterality: N/A;  . ESOPHAGOGASTRODUODENOSCOPY (EGD) WITH PROPOFOL N/A 04/09/2017   Procedure: ESOPHAGOGASTRODUODENOSCOPY (EGD) WITH PROPOFOL;  Surgeon: Lucilla Lame, MD;  Location: [facility] ENDOSCOPY;  Service: Endoscopy;  Laterality: N/A;  . HIP FRACTURE SURGERY     Patient  Active Problem List   Diagnosis Date Noted  . Rectal bleed 11/19/2018  . 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 Benzodiazepine abuse ([place]) 09/03/2018  . Moderate recurrent major depression ([place]) 09/03/2018  . Hyponatremia 10/11/2017  . Chest pain 07/28/2017  . Acute blood loss anemia 07/28/2017  . Demand ischemia ([place])   . Benign neoplasm of descending colon   . Polyp of sigmoid colon   . Age related osteoporosis 04/16/2017  . Iron deficiency anemia secondary to blood loss (chronic)   . GI bleed 04/06/2017  . Diastolic dysfunction 80/32/1224  . Opioid contract exists 06/27/2016  . Incidental lung nodule 01/18/2016  . Traumatic hemorrhage of cerebrum ([place]) 12/04/2015  . Hypovitaminosis D 08/29/2015  . Thyroid lesion 08/29/2015  . Major depressive disorder, single episode, unspecified 08/27/2015  . 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 Gastroesophageal reflux disease 01/26/2015  . Osteopenia 01/26/2015  . Anemia, unspecified 12/27/2014  . History of fall 12/27/2014  . Greater tuberosity of humerus fracture 10/24/2013  . AVF (arteriovenous fistula) ([place]) 11/08/2011  . Anxiety 10/24/2011  . Cerebrovascular disease 10/24/2011  . Dyslipidemia 10/24/2011  . Hypertension 10/24/2011  . Tobacco abuse 10/24/2011  . Palpitations 10/24/2011  . Renal artery stenosis ([place]) 10/24/2011  . Unstable angina ([place]) 10/14/2011  . Hyperlipidemia   . Chronic obstructive pulmonary disease ([place])   .  Depression   . Peripheral arterial disease ([place])   . Coronary artery disease, non-occlusive       Prior to Admission medications   Medication Sig Start Date End Date Taking? Authorizing Provider  acetaminophen (TYLENOL) 325 MG tablet Take 3 tablets (975 mg total) by mouth 3 (three) times daily as needed. 02/11/19   Carrie Mew, MD  albuterol (PROVENTIL HFA;VENTOLIN HFA) 108 [phone] Base) MCG/ACT inhaler Inhale 2 puffs into the lungs every 6 (six) hours as needed for wheezing or  shortness of breath. 04/02/18   Merlyn Lot, MD  alprazolam Duanne Moron) 2 MG tablet Take 2 mg by mouth 2 (two) times daily.     [provider]  atorvastatin (LIPITOR) 80 MG tablet Take 80 mg by mouth daily.    [provider]  benzocaine (ORAJEL) 10 % mucosal gel Use as directed 1 application in the mouth or throat as needed for mouth pain. 02/11/19   Carrie Mew, MD  betamethasone valerate ointment (VALISONE) 0.1 % Apply 1 application topically 2 (two) times daily. 08/19/18   [provider]  busPIRone (BUSPAR) 10 MG tablet Take 10 mg by mouth 2 (two) times daily.     [provider]  carvedilol (COREG) 6.25 MG tablet Take 6.25 mg by mouth 2 (two) times daily. 05/28/17   [provider]  cetirizine (ZYRTEC) 10 MG tablet Take 10 mg by mouth daily. 08/12/18   [provider]  citalopram (CELEXA) 40 MG tablet Take 40 mg by mouth daily.    [provider]  DULoxetine (CYMBALTA) 30 MG capsule Take 30 mg by mouth daily. 05/28/17   [provider]  ferrous sulfate 325 (65 FE) MG tablet Take 1 tablet (325 mg total) by mouth 3 (three) times daily with meals. Patient not taking: Reported on 11/19/2018 07/30/17   Fritzi Mandes, MD  gabapentin (NEURONTIN) 300 MG capsule Take 300 mg by mouth 3 (three) times daily.     [provider]  lisinopril (PRINIVIL,ZESTRIL) 20 MG tablet Take 20 mg by mouth daily.      [provider]  Multiple Vitamin (MULTIVITAMIN WITH MINERALS) TABS tablet Take 2 tablets by mouth daily.    [provider]  nitroGLYCERIN (NITROSTAT) 0.4 MG SL tablet Place 0.4 mg under the tongue every 5 (five) minutes as needed.     [provider]  pantoprazole (PROTONIX) 20 MG tablet Take 20 mg by mouth daily.    [provider]  pregabalin (LYRICA) 150 MG capsule Take 150 mg by mouth 2 (two) times daily. 11/18/18 11/18/19  [provider]  SYMBICORT 80-4.5 MCG/ACT inhaler  Inhale 2 puffs into the lungs 2 (two) times daily. 08/12/18   [provider]  traMADol (ULTRAM) 50 MG tablet Take 1 tablet (50 mg total) by mouth 3 (three) times daily as needed for moderate pain. 11/21/18   Gladstone Lighter, MD  vitamin B-12 (CYANOCOBALAMIN) 500 MCG tablet Take 1 tablet (500 mcg total) by mouth daily. 11/21/18   Gladstone Lighter, MD  Vitamin D, Ergocalciferol, (DRISDOL) 50000 units CAPS capsule Take 50,000 Units by mouth every Saturday.  05/01/17   [provider]    Allergies Chantix [varenicline]; Diphenhydramine hcl; Benadryl [diphenhydramine hcl]; Niacin; and Trazodone    Social History Social History   Tobacco Use  . Smoking status: Current Some Day Smoker    Packs/day: 1.00    Years: 46.00    Pack years: 46.00    Types: Cigarettes  . Smokeless tobacco: Never Used  .  Tobacco comment: she feels that the 21 nicotine made her jittery and we suggested a lower level patch  Substance Use Topics  . Alcohol use: No  . Drug use: Yes    Types: Marijuana    Comment: monthly    Review of Systems Patient denies headaches, rhinorrhea, blurry vision, numbness, shortness of breath, chest pain, edema, cough, abdominal pain, nausea, vomiting, diarrhea, dysuria, fevers, rashes or hallucinations unless otherwise stated above in HPI. ____________________________________________   PHYSICAL EXAM:  VITAL SIGNS: Vitals:   05/16/19 2133 05/16/19 2300  BP:  (!) 152/82  Pulse:  70  Resp:  19  Temp: 98 F (36.7 C)   SpO2:  93%    Constitutional: Alert in acute resp distress, protecting her airway Eyes: Conjunctivae are normal.  Head: Atraumatic. Nose: No congestion/rhinnorhea. Mouth/Throat: Mucous membranes are moist.   Neck: No stridor. Painless ROM.  Cardiovascular: Normal rate, regular rhythm. Grossly normal heart sounds.  Good peripheral circulation. Respiratory: tachypnea.  No retractions. Lungs with diminished breathsounds  throught Gastrointestinal: Soft and nontender. No distention. No abdominal bruits. No CVA tenderness. Genitourinary: deferred Musculoskeletal: No lower extremity tenderness nor edema.  No joint effusions. Neurologic:  Normal speech and language. No gross focal neurologic deficits are appreciated. No facial droop Skin:  Skin is warm, dry and intact. No rash noted. Psychiatric: Mood and affect are normal. Speech and behavior are normal.  ____________________________________________   LABS (all labs ordered are listed, but only abnormal results are displayed)  Results for orders placed or performed during the hospital encounter of 05/16/19 (from the past 24 hour(s))  SARS Coronavirus 2 (CEPHEID- Performed in [place] hospital lab), Hosp Order     Status: None   Collection Time: 05/16/19  9:29 PM  Result Value Ref Range   SARS Coronavirus 2 NEGATIVE NEGATIVE  Lactic acid, plasma     Status: None   Collection Time: 05/16/19  9:29 PM  Result Value Ref Range   Lactic Acid, Venous 1.0 0.5 - 1.9 mmol/L  Comprehensive metabolic panel     Status: Abnormal   Collection Time: 05/16/19  9:29 PM  Result Value Ref Range   Sodium 141 135 - 145 mmol/L   Potassium 3.1 (L) 3.5 - 5.1 mmol/L   Chloride 111 98 - 111 mmol/L   CO2 22 22 - 32 mmol/L   Glucose, Bld 101 (H) 70 - 99 mg/dL   BUN 9 8 - 23 mg/dL   Creatinine, Ser 0.64 0.44 - 1.00 mg/dL   Calcium 8.5 (L) 8.9 - 10.3 mg/dL   Total Protein 6.7 6.5 - 8.1 g/dL   Albumin 3.6 3.5 - 5.0 g/dL   AST 18 15 - 41 U/L   ALT 14 0 - 44 U/L   Alkaline Phosphatase 93 38 - 126 U/L   Total Bilirubin 0.6 0.3 - 1.2 mg/dL   GFR calc non Af Amer >60 >60 mL/min   GFR calc Af Amer >60 >60 mL/min   Anion gap 8 5 - 15  CBC WITH DIFFERENTIAL     Status: Abnormal   Collection Time: 05/16/19  9:29 PM  Result Value Ref Range   WBC 9.3 4.0 - 10.5 K/uL   RBC 3.95 3.87 - 5.11 MIL/uL   Hemoglobin 9.7 (L) 12.0 - 15.0 g/dL   HCT 32.1 (L) 36.0 - 46.0 %   MCV 81.3 80.0  - 100.0 fL   MCH 24.6 (L) 26.0 - 34.0 pg   MCHC 30.2 30.0 - 36.0 g/dL  RDW 14.9 11.5 - 15.5 %   Platelets 273 150 - 400 K/uL   nRBC 0.0 0.0 - 0.2 %   Neutrophils Relative % 59 %   Neutro Abs 5.4 1.7 - 7.7 K/uL   Lymphocytes Relative 31 %   Lymphs Abs 2.9 0.7 - 4.0 K/uL   Monocytes Relative 7 %   Monocytes Absolute 0.7 0.1 - 1.0 K/uL   Eosinophils Relative 2 %   Eosinophils Absolute 0.2 0.0 - 0.5 K/uL   Basophils Relative 1 %   Basophils Absolute 0.1 0.0 - 0.1 K/uL   Immature Granulocytes 0 %   Abs Immature Granulocytes 0.03 0.00 - 0.07 K/uL  Procalcitonin     Status: None   Collection Time: 05/16/19  9:29 PM  Result Value Ref Range   Procalcitonin <0.10 ng/mL  Blood gas, venous     Status: Abnormal   Collection Time: 05/16/19  9:29 PM  Result Value Ref Range   pH, Ven 7.34 7.250 - 7.430   pCO2, Ven 42 (L) 44.0 - 60.0 mmHg   pO2, Ven 52.0 (H) 32.0 - 45.0 mmHg   Bicarbonate 22.7 20.0 - 28.0 mmol/L   Acid-base deficit 3.0 (H) 0.0 - 2.0 mmol/L   O2 Saturation 84.0 %   Patient temperature 37.0    Collection site VENOUS    Sample type VENOUS   Brain natriuretic peptide     Status: Abnormal   Collection Time: 05/16/19  9:29 PM  Result Value Ref Range   B Natriuretic Peptide 260.0 (H) 0.0 - 100.0 pg/mL  Troponin I - Add-On to previous collection     Status: None   Collection Time: 05/16/19  9:29 PM  Result Value Ref Range   Troponin I <0.03 <0.03 ng/mL   ____________________________________________  EKG My review and personal interpretation at Time: 21:30   Indication: sob  Rate: 75  Rhythm: sinus Axis: normal Other: normal intervals, no stemi ____________________________________________  RADIOLOGY  I personally reviewed all radiographic images ordered to evaluate for the above acute complaints and reviewed radiology reports and findings.  These findings were personally discussed with the patient.  Please see medical record for radiology  report.  ____________________________________________   PROCEDURES  Procedure(s) performed:  .Critical Care Performed by: Merlyn Lot, MD Authorized by: Merlyn Lot, MD   Critical care provider statement:    Critical care time (minutes):  30   Critical care time was exclusive of:  Separately billable procedures and treating other patients   Critical care was necessary to treat or prevent imminent or life-threatening deterioration of the following conditions:  Respiratory failure   Critical care was time spent personally by me on the following activities:  Development of treatment plan with patient or surrogate, discussions with consultants, evaluation of patient's response to treatment, examination of patient, obtaining history from patient or surrogate, ordering and performing treatments and interventions, ordering and review of laboratory studies, ordering and review of radiographic studies, pulse oximetry, re-evaluation of patient's condition and review of old charts      Critical Care performed: yes ____________________________________________   INITIAL IMPRESSION / [place] / ED COURSE  Pertinent labs & imaging results that were available during my care of the patient were reviewed by me and considered in my medical decision making (see chart for details).   DDX: Asthma, copd, CHF, pna, ptx, malignancy, Pe, anemia   Traci Duran is a 63 y.o. who presents to the ED with symptoms as described above.  Patient placed  on BiPAP for respiratory support. The patient will be placed on continuous pulse oximetry and telemetry for monitoring.  Laboratory evaluation will be sent to evaluate for the above complaints.     Clinical Course as of May 16 2319  Mon May 16, 2019  2254 Blood work thus far suggest pure heart failure.  We will give IV Lasix.  Her respiratory status has significantly improved.  We will see if we can wean from BiPAP.   [PR]    Clinical  Course User Index [PR] Merlyn Lot, MD   The patient was evaluated in Emergency Department today for the symptoms described in the history of present illness. He/she was evaluated in the context of the global COVID-19 pandemic, which necessitated consideration that the patient might be at risk for infection with the SARS-CoV-2 virus that causes COVID-19. Institutional protocols and algorithms that pertain to the evaluation of patients at risk for COVID-19 are in a state of rapid change based on information released by regulatory bodies including the CDC and federal and state organizations. These policies and algorithms were followed during the patient's care in the ED.   As part of my medical decision making, I reviewed the following data within the [place] notes reviewed and incorporated, Labs reviewed, notes from prior ED visits and [place] Controlled Substance Database   ____________________________________________   FINAL CLINICAL IMPRESSION(S) / ED DIAGNOSES  Final diagnoses:  Acute respiratory failure with hypoxia ([place])      NEW MEDICATIONS STARTED DURING THIS VISIT:  New Prescriptions   No medications on file     Note:  This document was prepared using Dragon voice recognition software and may include unintentional dictation errors.    Merlyn Lot, MD 05/16/19 [phone]

## 2019-05-16 NOTE — ED Triage Notes (Signed)
Pt arrived via EMS from home with CPAP in place. EMS reports pt called out for respiratory distress. On arrival pt was mid 80s, on CPap pt has improved in route to 98%. Pt received 125mg  solumedrol with 1 duo neb treatment. MD at bedside.

## 2019-05-16 NOTE — Progress Notes (Signed)
Patient being weaned off Bipap, currently on 2L [place] SAT at 96%, no distress noted at this time. Will continue to monitor.

## 2019-05-17 ENCOUNTER — Other Ambulatory Visit: Payer: Self-pay

## 2019-05-17 ENCOUNTER — Inpatient Hospital Stay (HOSPITAL_COMMUNITY)
Admit: 2019-05-17 | Discharge: 2019-05-17 | Disposition: A | Discharge status: Home / Self Care | Payer: Medicare Other | Attending: Family Medicine | Admitting: Family Medicine

## 2019-05-17 DIAGNOSIS — I5031 Acute diastolic (congestive) heart failure: Secondary | ICD-10-CM

## 2019-05-17 DIAGNOSIS — I340 Nonrheumatic mitral (valve) insufficiency: Secondary | ICD-10-CM

## 2019-05-17 LAB — CBC
HCT: 35.4 % — ABNORMAL LOW (ref 36.0–46.0)
Hemoglobin: 10.8 g/dL — ABNORMAL LOW (ref 12.0–15.0)
MCH: 24.3 pg — ABNORMAL LOW (ref 26.0–34.0)
MCHC: 30.5 g/dL (ref 30.0–36.0)
MCV: 79.6 fL — ABNORMAL LOW (ref 80.0–100.0)
Platelets: 303 10*3/uL (ref 150–400)
RBC: 4.45 MIL/uL (ref 3.87–5.11)
RDW: 14.9 % (ref 11.5–15.5)
WBC: 8.8 10*3/uL (ref 4.0–10.5)
nRBC: 0 % (ref 0.0–0.2)

## 2019-05-17 LAB — MAGNESIUM: Magnesium: 1.7 mg/dL (ref 1.7–2.4)

## 2019-05-17 LAB — BASIC METABOLIC PANEL
Anion gap: 11 (ref 5–15)
BUN: 12 mg/dL (ref 8–23)
CO2: 24 mmol/L (ref 22–32)
Calcium: 9.4 mg/dL (ref 8.9–10.3)
Chloride: 105 mmol/L (ref 98–111)
Creatinine, Ser: 0.75 mg/dL (ref 0.44–1.00)
GFR calc Af Amer: >60 mL/min (ref >60)
GFR calc non Af Amer: >60 mL/min (ref >60)
Glucose, Bld: 147 mg/dL — ABNORMAL HIGH (ref 70–99)
Potassium: 3 mmol/L — ABNORMAL LOW (ref 3.5–5.1)
Sodium: 140 mmol/L (ref 135–145)

## 2019-05-17 LAB — ECHOCARDIOGRAM COMPLETE
Height: 67 in
Weight: 2115.2 oz

## 2019-05-17 LAB — LACTIC ACID, PLASMA
Lactic Acid, Venous: 2 mmol/L (ref 0.5–1.9)
Lactic Acid, Venous: 1.2 mmol/L (ref 0.5–1.9)

## 2019-05-17 MED ORDER — AZITHROMYCIN 250 MG PO TABS
500.0000 mg | ORAL_TABLET | Freq: Every day | ORAL | Status: DC
  Administered 2019-05-18: 500 mg via ORAL
  Filled 2019-05-17: qty 2

## 2019-05-17 MED ORDER — PREDNISONE 50 MG PO TABS
50.0000 mg | ORAL_TABLET | Freq: Every day | ORAL | Status: DC
  Administered 2019-05-18: 50 mg via ORAL
  Filled 2019-05-17: qty 1

## 2019-05-17 MED ORDER — METHYLPREDNISOLONE SODIUM SUCC 125 MG IJ SOLR
60.0000 mg | Freq: Three times a day (TID) | INTRAMUSCULAR | Status: DC
  Administered 2019-05-17 (×2): 60 mg via INTRAVENOUS
  Filled 2019-05-17 (×2): qty 2

## 2019-05-17 MED ORDER — FUROSEMIDE 10 MG/ML IJ SOLN
40.0000 mg | Freq: Two times a day (BID) | INTRAMUSCULAR | Status: DC
  Administered 2019-05-17 – 2019-05-18 (×3): 40 mg via INTRAVENOUS
  Filled 2019-05-17 (×3): qty 4

## 2019-05-17 MED ORDER — POTASSIUM CHLORIDE 20 MEQ PO PACK
40.0000 meq | PACK | Freq: Once | ORAL | Status: AC
  Administered 2019-05-17: 40 meq via ORAL
  Filled 2019-05-17: qty 2

## 2019-05-17 MED ORDER — POTASSIUM CHLORIDE CRYS ER 20 MEQ PO TBCR
40.0000 meq | EXTENDED_RELEASE_TABLET | Freq: Once | ORAL | Status: AC
  Administered 2019-05-17: 40 meq via ORAL
  Filled 2019-05-17: qty 2

## 2019-05-17 MED ORDER — POTASSIUM CHLORIDE 20 MEQ PO PACK
40.0000 meq | PACK | Freq: Once | ORAL | Status: AC
  Administered 2019-05-18: 40 meq via ORAL
  Filled 2019-05-17: qty 2

## 2019-05-17 MED ORDER — HYDRALAZINE HCL 20 MG/ML IJ SOLN
10.0000 mg | Freq: Once | INTRAMUSCULAR | Status: AC
  Administered 2019-05-17: 10 mg via INTRAVENOUS
  Filled 2019-05-17: qty 1

## 2019-05-17 NOTE — H&P (Signed)
[facility] [place] [facility]   PATIENT NAME: Traci Duran    MR#:  [id number]  DATE OF BIRTH:  [birth year]  DATE OF ADMISSION:  05/16/2019  PRIMARY CARE PHYSICIAN: [name]   REQUESTING/REFERRING PHYSICIAN: Willy Eddy, MD  CHIEF COMPLAINT:   Chief Complaint  Patient presents with  . Respiratory Distress    HISTORY OF PRESENT ILLNESS:  Traci Duran  is a 63 y.o. Caucasian female with a known history of multiple medical problems that will be mentioned below, who presented to the emergency room with onset of dyspnea with associated cough productive of clear sputum as well as wheezing which have been going on for a week and significantly worsening today.  She admitted to associated rhinorrhea as well as nasal congestion..  No fever or chills.  She has been having headache without dizziness or blurred vision.  No nausea vomiting or abdominal pain.  When she came to the ER she was in respiratory distress and was placed on BiPAP which was just discontinued about 20 minutes before my interview.  The patient admitted to orthopnea without worsening lower extremity edema or paroxysmal nocturnal dyspnea.  She has been having dyspnea on exertion.  No melena or bright red pain per rectum or other bleeding diathesis.  Upon presentation to the emergency room, her blood pressure was elevated 156/84 and respiratory rate was 26 or higher pulse ox 90 was 97% on FiO2 of 28% on BiPAP and later 2 L of O2 by nasal cannula.  Labs revealed hypokalemia with a potassium of 3.1 and her BNP was 260 with troponin I less than 0.03.  Lactic acid was 1 and procalcitonin less than 0.1.  CBC was remarkable for anemia with hemoglobin of 9.7 hematocrit of 32.1 lower than her previous levels 3 months ago without leukocytosis.  COVID-19 test came back negative.  Portable chest x-ray showed aortic atherosclerosis with no acute cardiopulmonary disease.  EKG showed normal sinus  rhythm with rate of 71.  The patient was given 40 mg of IV Lasix as well as nebulized DuoNeb's and IV Solu-Medrol.  She will be admitted to a telemetry bed for further evaluation and management. PAST MEDICAL HISTORY:   Past Medical History:  Diagnosis Date  . Anemia   . Anxiety   . COPD (chronic obstructive pulmonary disease) (HCC)   . Coronary artery disease   . Depression   . Gastritis   . GERD (gastroesophageal reflux disease)   . Hemorrhoids   . Hyperlipidemia   . Hypertension   . PAD (peripheral artery disease) (HCC)     PAST SURGICAL HISTORY:   Past Surgical History:  Procedure Laterality Date  . APPENDECTOMY    . COLONOSCOPY WITH PROPOFOL N/A 06/09/2017   Procedure: COLONOSCOPY WITH PROPOFOL;  Surgeon: Midge Minium, MD;  Location: [facility] ENDOSCOPY;  Service: Endoscopy;  Laterality: N/A;  . ESOPHAGOGASTRODUODENOSCOPY N/A 07/30/2017   Procedure: ESOPHAGOGASTRODUODENOSCOPY (EGD);  Surgeon: Toney Reil, MD;  Location: [facility] ENDOSCOPY;  Service: Gastroenterology;  Laterality: N/A;  . ESOPHAGOGASTRODUODENOSCOPY (EGD) WITH PROPOFOL N/A 04/09/2017   Procedure: ESOPHAGOGASTRODUODENOSCOPY (EGD) WITH PROPOFOL;  Surgeon: Midge Minium, MD;  Location: [facility] ENDOSCOPY;  Service: Endoscopy;  Laterality: N/A;  . HIP FRACTURE SURGERY      SOCIAL HISTORY:   Social History   Tobacco Use  . Smoking status: Current Some Day Smoker    Packs/day: 1.00    Years: 46.00    Pack years: 46.00    Types: Cigarettes  .  Smokeless tobacco: Never Used  . Tobacco comment: she feels that the 21 nicotine made her jittery and we suggested a lower level patch  Substance Use Topics  . Alcohol use: No    FAMILY HISTORY:   Family History  Problem Relation Age of Onset  . Heart failure Mother   . Heart attack Mother        mother died at 66 of an MI  . Heart attack Father        father died at 45 of an MI  . Heart attack Brother        Brother had an MI in his 83s    DRUG ALLERGIES:    Allergies  Allergen Reactions  . Chantix [Varenicline] Hives, Shortness Of Breath and Palpitations  . Diphenhydramine Hcl Shortness Of Breath  . Benadryl [Diphenhydramine Hcl] Rash  . Niacin Rash  . Trazodone Palpitations    REVIEW OF SYSTEMS:   ROS As per history of present illness. All pertinent systems were reviewed above. Constitutional,  HEENT, cardiovascular, respiratory, GI, GU, musculoskeletal, neuro, psychiatric, endocrine,  integumentary and hematologic systems were reviewed and are otherwise  negative/unremarkable except for positive findings mentioned above in the HPI.   MEDICATIONS AT HOME:   Prior to Admission medications   Medication Sig Start Date End Date Taking? Authorizing Provider  acetaminophen (TYLENOL) 325 MG tablet Take 3 tablets (975 mg total) by mouth 3 (three) times daily as needed. 02/11/19   Sharman Cheek, MD  albuterol (PROVENTIL HFA;VENTOLIN HFA) 108 [phone] Base) MCG/ACT inhaler Inhale 2 puffs into the lungs every 6 (six) hours as needed for wheezing or shortness of breath. 04/02/18   Willy Eddy, MD  alprazolam Prudy Feeler) 2 MG tablet Take 2 mg by mouth 2 (two) times daily.     [provider]  atorvastatin (LIPITOR) 80 MG tablet Take 80 mg by mouth daily.    [provider]  benzocaine (ORAJEL) 10 % mucosal gel Use as directed 1 application in the mouth or throat as needed for mouth pain. 02/11/19   Sharman Cheek, MD  betamethasone valerate ointment (VALISONE) 0.1 % Apply 1 application topically 2 (two) times daily. 08/19/18   [provider]  busPIRone (BUSPAR) 10 MG tablet Take 10 mg by mouth 2 (two) times daily.     [provider]  carvedilol (COREG) 6.25 MG tablet Take 6.25 mg by mouth 2 (two) times daily. 05/28/17   [provider]  cetirizine (ZYRTEC) 10 MG tablet Take 10 mg by mouth daily. 08/12/18   [provider]  citalopram (CELEXA) 40 MG tablet Take 40 mg by mouth daily.    [provider]  DULoxetine (CYMBALTA) 30 MG capsule Take 30 mg by mouth daily. 05/28/17   [provider]  ferrous sulfate 325 (65 FE) MG tablet Take 1 tablet (325 mg total) by mouth 3 (three) times daily with meals. Patient not taking: Reported on 11/19/2018 07/30/17   Enedina Finner, MD  gabapentin (NEURONTIN) 300 MG capsule Take 300 mg by mouth 3 (three) times daily.     [provider]  lisinopril (PRINIVIL,ZESTRIL) 20 MG tablet Take 20 mg by mouth daily.      [provider]  Multiple Vitamin (MULTIVITAMIN WITH MINERALS) TABS tablet Take 2 tablets by mouth daily.    [provider]  nitroGLYCERIN (NITROSTAT) 0.4 MG SL tablet Place 0.4 mg under the tongue every 5 (five) minutes as needed.     [provider]  pantoprazole (  PROTONIX) 20 MG tablet Take 20 mg by mouth daily.    [provider]  pregabalin (LYRICA) 150 MG capsule Take 150 mg by mouth 2 (two) times daily. 11/18/18 11/18/19  [provider]  SYMBICORT 80-4.5 MCG/ACT inhaler Inhale 2 puffs into the lungs 2 (two) times daily. 08/12/18   [provider]  traMADol (ULTRAM) 50 MG tablet Take 1 tablet (50 mg total) by mouth 3 (three) times daily as needed for moderate pain. 11/21/18   Enid Baas, MD  vitamin B-12 (CYANOCOBALAMIN) 500 MCG tablet Take 1 tablet (500 mcg total) by mouth daily. 11/21/18   Enid Baas, MD  Vitamin D, Ergocalciferol, (DRISDOL) 50000 units CAPS capsule Take 50,000 Units by mouth every Saturday.  05/01/17   [provider]      VITAL SIGNS:  Blood pressure (!) 170/101, pulse 80, temperature 98 F (36.7 C), temperature source Oral, resp. rate (!) 21, weight 63.5 kg, SpO2 97 %.  PHYSICAL EXAMINATION:  Physical Exam  GENERAL:  63 y.o.-year-old Caucasian female patient lying in the bed with no acute distress.  EYES: Pupils equal, round, reactive to light and accommodation. No scleral icterus. Extraocular muscles intact.   HEENT: Head atraumatic, normocephalic. Oropharynx and nasopharynx clear.  NECK:  Supple, no jugular venous distention. No thyroid enlargement, no tenderness.  LUNGS: Diminished bibasal sounds with bibasal rales and decreased expiratory airflow with occasional wheezes and harsh vesicular breathing. CARDIOVASCULAR: Regular rate and rhythm, S1, S2 normal. No murmurs, rubs, or gallops.  ABDOMEN: Soft, nondistended, nontender. Bowel sounds present. No organomegaly or mass.  EXTREMITIES: No pedal edema, cyanosis, or clubbing.  NEUROLOGIC: Cranial nerves II through XII are intact. Muscle strength 5/5 in all extremities. Sensation intact. Gait not checked.  PSYCHIATRIC: The patient is alert and oriented x 3.  Normal affect and good eye contact. SKIN: No obvious rash, lesion, or ulcer.   LABORATORY PANEL:   CBC Recent Labs  Lab 05/16/19 2129  WBC 9.3  HGB 9.7*  HCT 32.1*  PLT 273   ------------------------------------------------------------------------------------------------------------------  Chemistries  Recent Labs  Lab 05/16/19 2129  NA 141  K 3.1*  CL 111  CO2 22  GLUCOSE 101*  BUN 9  CREATININE 0.64  CALCIUM 8.5*  AST 18  ALT 14  ALKPHOS 93  BILITOT 0.6   ------------------------------------------------------------------------------------------------------------------  Cardiac Enzymes Recent Labs  Lab 05/16/19 2129  TROPONINI <0.03   ------------------------------------------------------------------------------------------------------------------  RADIOLOGY:  Dg Chest Port 1 View  Result Date: 05/16/2019 CLINICAL DATA:  Initial evaluation for acute respiratory distress. Evaluate for pneumonia. EXAM: PORTABLE CHEST 1 VIEW COMPARISON:  Prior radiograph from 02/11/2019. FINDINGS: Transverse heart size at the upper limits of normal. Mediastinal silhouette within normal limits. Aortic atherosclerosis. Lungs normally inflated. Diffuse pulmonary interstitial prominence  with vascular congestion, compatible with mild diffuse pulmonary interstitial edema. No pleural effusion. No consolidative opacity to suggest bronchopneumonia. No pneumothorax. No acute osseous finding. IMPRESSION: 1. Diffuse pulmonary interstitial prominence, compatible with mild diffuse pulmonary interstitial edema. 2. No consolidative opacity to suggest bronchopneumonia. 3. Aortic atherosclerosis. Electronically Signed   By: Rise Mu M.D.   On: 05/16/2019 21:52      IMPRESSION AND PLAN:   1.  Acute on chronic diastolic CHF.  The last 2D echo was in 2018 and revealing an EF of 60 to 65% with left ventricular diastolic dysfunction.The patient will be admitted to a telemetry bed and will be diuresed with IV Lasix.  Will follow serial cardiac enzymes.  Will obtain a 2D echo(since it  was not done last 6 months) and a cardiology consultation in a.m.   2.  COPD acute exacerbation.The patient will be will be placed on IV steroid therapy with IV Solu-Medrol as well as nebulized bronchodilator therapy with duonebs q.i.d. and q.4 hours p.r.n., mucolytic therapy with Mucinex and antibiotic therapy with IV Rocephin and Zithromax.  Sputum Gram stain culture and sensitivity will be obtained.  O2 protocol will be followed. Symbicort will be held off.  3.  Acute respiratory failure secondary to #1 #2.  She required BiPAP initially and is currently on nasal cannula.  O2 protocol will be followed.  4.  Hypokalemia.  Her potassium will be replaced and magnesium level will be checked.  5.  Hypertension.  Coreg and lisinopril will be resumed.  6.  Coronary artery disease.  Plavix, Coreg and statin therapy as well as as needed sublingual nitroglycerin will be resumed.  7.  Anxiety/depression.  Her Xanax, BuSpar and citalopram will be resumed.  8.  DVT prophylaxis.  This will be provided subcutaneous Lovenox  All the records are reviewed and case discussed with ED provider. The plan of care was  discussed in details with the patient (and family). I answered all questions. The patient agreed to proceed with the above mentioned plan. Further management will depend upon hospital course.   CODE STATUS: Full code  TOTAL TIME TAKING CARE OF THIS PATIENT: 50 minutes.    Hannah Beat M.D on 05/17/2019 at 12:13 AM  Pager - [phone]  After 6pm go to [email or web address] - Social research officer, government  [facility]  Office  [phone]  CC: Primary care physician; [name]   Note: This dictation was prepared with Dragon dictation along with smaller phrase technology. Any transcriptional errors that result from this process are unintentional.

## 2019-05-17 NOTE — ED Notes (Signed)
ED TO INPATIENT HANDOFF REPORT  ED Nurse Name and Phone #: Tessie Fass [phone]  S Name/Age/Gender Traci Duran 63 y.o. female Room/Bed: ED06A/ED06A  Code Status   Code Status: Full Code  Home/SNF/Other Home Patient oriented to: self, place, time and situation Is this baseline? Yes   Triage Complete: Triage complete  Chief Complaint sob  Triage Note Pt arrived via EMS from home with CPAP in place. EMS reports pt called out for respiratory distress. On arrival pt was mid 62s, on CPap pt has improved in route to 98%. Pt received 125mg  solumedrol with 1 duo neb treatment. MD at bedside.    Allergies Allergies  Allergen Reactions  . Chantix [Varenicline] Hives, Shortness Of Breath and Palpitations  . Diphenhydramine Hcl Shortness Of Breath  . Benadryl [Diphenhydramine Hcl] Rash  . Niacin Rash  . Trazodone Palpitations    Level of Care/Admitting Diagnosis ED Disposition    ED Disposition Condition Comment   Admit  Hospital Area: [facility] [[id number]]  Level of Care: Telemetry [5]  Covid Evaluation: Confirmed COVID Negative  Diagnosis: Acute CHF (congestive heart failure) [facility]) [[id number]]  Admitting Physician: Christel Mormon [[id number]]  Attending Physician: Christel Mormon [[id number]]  Estimated length of stay: 3 - 4 days  Certification:: I certify this patient will need inpatient services for at least 2 midnights  PT Class (Do Not Modify): Inpatient [101]  PT Acc Code (Do Not Modify): Private [1]       B Medical/Surgery History Past Medical History:  Diagnosis Date  . Anemia   . Anxiety   . COPD (chronic obstructive pulmonary disease) ([place])   . Coronary artery disease   . Depression   . Gastritis   . GERD (gastroesophageal reflux disease)   . Hemorrhoids   . Hyperlipidemia   . Hypertension   . PAD (peripheral artery disease) ([place])    Past Surgical History:  Procedure Laterality Date  . APPENDECTOMY    . COLONOSCOPY WITH PROPOFOL N/A 06/09/2017    Procedure: COLONOSCOPY WITH PROPOFOL;  Surgeon: Lucilla Lame, MD;  Location: [facility] ENDOSCOPY;  Service: Endoscopy;  Laterality: N/A;  . ESOPHAGOGASTRODUODENOSCOPY N/A 07/30/2017   Procedure: ESOPHAGOGASTRODUODENOSCOPY (EGD);  Surgeon: Lin Landsman, MD;  Location: [facility] ENDOSCOPY;  Service: Gastroenterology;  Laterality: N/A;  . ESOPHAGOGASTRODUODENOSCOPY (EGD) WITH PROPOFOL N/A 04/09/2017   Procedure: ESOPHAGOGASTRODUODENOSCOPY (EGD) WITH PROPOFOL;  Surgeon: Lucilla Lame, MD;  Location: [facility] ENDOSCOPY;  Service: Endoscopy;  Laterality: N/A;  . HIP FRACTURE SURGERY       A IV Location/Drains/Wounds Patient Lines/Drains/Airways Status   Active Line/Drains/Airways    Name:   Placement date:   Placement time:   Site:   Days:   Peripheral IV 09/03/18 Left Antecubital   09/03/18    1800    Antecubital   256   Peripheral IV 11/19/18 Right Antecubital   11/19/18    1504    Antecubital   179   Peripheral IV 05/16/19 Right Forearm   05/16/19    2129    Forearm   1          Intake/Output Last 24 hours No intake or output data in the 24 hours ending 05/17/19 0407  Labs/Imaging Results for orders placed or performed during the hospital encounter of 05/16/19 (from the past 48 hour(s))  SARS Coronavirus 2 (CEPHEID- Performed in [place] hospital lab), Hosp Order     Status: None   Collection Time: 05/16/19  9:29 PM  Result Value Ref Range  SARS Coronavirus 2 NEGATIVE NEGATIVE    Comment: (NOTE) If result is NEGATIVE SARS-CoV-2 target nucleic acids are NOT DETECTED. The SARS-CoV-2 RNA is generally detectable in upper and lower  respiratory specimens during the acute phase of infection. The lowest  concentration of SARS-CoV-2 viral copies this assay can detect is 250  copies / mL. A negative result does not preclude SARS-CoV-2 infection  and should not be used as the sole basis for treatment or other  patient management decisions.  A negative result may occur with  improper specimen  collection / handling, submission of specimen other  than nasopharyngeal swab, presence of viral mutation(s) within the  areas targeted by this assay, and inadequate number of viral copies  (<250 copies / mL). A negative result must be combined with clinical  observations, patient history, and epidemiological information. If result is POSITIVE SARS-CoV-2 target nucleic acids are DETECTED. The SARS-CoV-2 RNA is generally detectable in upper and lower  respiratory specimens dur ing the acute phase of infection.  Positive  results are indicative of active infection with SARS-CoV-2.  Clinical  correlation with patient history and other diagnostic information is  necessary to determine patient infection status.  Positive results do  not rule out bacterial infection or co-infection with other viruses. If result is PRESUMPTIVE POSTIVE SARS-CoV-2 nucleic acids MAY BE PRESENT.   A presumptive positive result was obtained on the submitted specimen  and confirmed on repeat testing.  While 2019 novel coronavirus  (SARS-CoV-2) nucleic acids may be present in the submitted sample  additional confirmatory testing may be necessary for epidemiological  and / or clinical management purposes  to differentiate between  SARS-CoV-2 and other Sarbecovirus currently known to infect humans.  If clinically indicated additional testing with an alternate test  methodology [phone]) is advised. The SARS-CoV-2 RNA is generally  detectable in upper and lower respiratory sp ecimens during the acute  phase of infection. The expected result is Negative. Fact Sheet for Patients:  StrictlyIdeas.no Fact Sheet for Healthcare Providers: BankingDealers.co.za This test is not yet approved or cleared by the [place] FDA and has been authorized for detection and/or diagnosis of SARS-CoV-2 by FDA under an Emergency Use Authorization (EUA).  This EUA will remain in effect  (meaning this test can be used) for the duration of the COVID-19 declaration under Section 564(b)(1) of the Act, 21 U.S.C. section 360bbb-3(b)(1), unless the authorization is terminated or revoked sooner. Performed at [facility], [address] [place], [place] [address]   Lactic acid, plasma     Status: None   Collection Time: 05/16/19  9:29 PM  Result Value Ref Range   Lactic Acid, Venous 1.0 0.5 - 1.9 mmol/L    Comment: Performed at [facility], [address] [place], [place] [address]  Comprehensive metabolic panel     Status: Abnormal   Collection Time: 05/16/19  9:29 PM  Result Value Ref Range   Sodium 141 135 - 145 mmol/L   Potassium 3.1 (L) 3.5 - 5.1 mmol/L   Chloride 111 98 - 111 mmol/L   CO2 22 22 - 32 mmol/L   Glucose, Bld 101 (H) 70 - 99 mg/dL   BUN 9 8 - 23 mg/dL   Creatinine, Ser 0.64 0.44 - 1.00 mg/dL   Calcium 8.5 (L) 8.9 - 10.3 mg/dL   Total Protein 6.7 6.5 - 8.1 g/dL   Albumin 3.6 3.5 - 5.0 g/dL   AST 18 15 - 41 U/L   ALT 14  0 - 44 U/L   Alkaline Phosphatase 93 38 - 126 U/L   Total Bilirubin 0.6 0.3 - 1.2 mg/dL   GFR calc non Af Amer >60 >60 mL/min   GFR calc Af Amer >60 >60 mL/min   Anion gap 8 5 - 15    Comment: Performed at [facility], [address] [place], [place] [address]  CBC WITH DIFFERENTIAL     Status: Abnormal   Collection Time: 05/16/19  9:29 PM  Result Value Ref Range   WBC 9.3 4.0 - 10.5 K/uL   RBC 3.95 3.87 - 5.11 MIL/uL   Hemoglobin 9.7 (L) 12.0 - 15.0 g/dL   HCT 32.1 (L) 36.0 - 46.0 %   MCV 81.3 80.0 - 100.0 fL   MCH 24.6 (L) 26.0 - 34.0 pg   MCHC 30.2 30.0 - 36.0 g/dL   RDW 14.9 11.5 - 15.5 %   Platelets 273 150 - 400 K/uL   nRBC 0.0 0.0 - 0.2 %   Neutrophils Relative % 59 %   Neutro Abs 5.4 1.7 - 7.7 K/uL   Lymphocytes Relative 31 %   Lymphs Abs 2.9 0.7 - 4.0 K/uL   Monocytes Relative 7 %   Monocytes Absolute 0.7 0.1 - 1.0 K/uL   Eosinophils Relative 2 %   Eosinophils Absolute 0.2 0.0 -  0.5 K/uL   Basophils Relative 1 %   Basophils Absolute 0.1 0.0 - 0.1 K/uL   Immature Granulocytes 0 %   Abs Immature Granulocytes 0.03 0.00 - 0.07 K/uL    Comment: Performed at [facility], [address], [place], [place] [address]  Blood Culture (routine x 2)     Status: None (Preliminary result)   Collection Time: 05/16/19  9:29 PM  Result Value Ref Range   Specimen Description BLOOD BLOOD RIGHT FOREARM    Special Requests      BOTTLES DRAWN AEROBIC AND ANAEROBIC Blood Culture results may not be optimal due to an excessive volume of blood received in culture bottles   Culture      NO GROWTH < 12 HOURS Performed at [facility], [address], [place], [place] [address]    Report Status PENDING   Blood Culture (routine x 2)     Status: None (Preliminary result)   Collection Time: 05/16/19  9:29 PM  Result Value Ref Range   Specimen Description BLOOD BLOOD LEFT FOREARM    Special Requests      BOTTLES DRAWN AEROBIC AND ANAEROBIC Blood Culture results may not be optimal due to an excessive volume of blood received in culture bottles   Culture      NO GROWTH < 12 HOURS Performed at [facility], [address] [place], [place] [address]    Report Status PENDING   Procalcitonin     Status: None   Collection Time: 05/16/19  9:29 PM  Result Value Ref Range   Procalcitonin <0.10 ng/mL    Comment:        Interpretation: PCT (Procalcitonin) <= 0.5 ng/mL: Systemic infection (sepsis) is not likely. Local bacterial infection is possible. (NOTE)       Sepsis PCT Algorithm           Lower Respiratory Tract                                      Infection PCT Algorithm    ----------------------------     ----------------------------  PCT < 0.25 ng/mL                PCT < 0.10 ng/mL         Strongly encourage             Strongly discourage   discontinuation of antibiotics    initiation of antibiotics    ----------------------------      -----------------------------       PCT 0.25 - 0.50 ng/mL            PCT 0.10 - 0.25 ng/mL               OR       >80% decrease in PCT            Discourage initiation of                                            antibiotics      Encourage discontinuation           of antibiotics    ----------------------------     -----------------------------         PCT >= 0.50 ng/mL              PCT 0.26 - 0.50 ng/mL               AND        <80% decrease in PCT             Encourage initiation of                                             antibiotics       Encourage continuation           of antibiotics    ----------------------------     -----------------------------        PCT >= 0.50 ng/mL                  PCT > 0.50 ng/mL               AND         increase in PCT                  Strongly encourage                                      initiation of antibiotics    Strongly encourage escalation           of antibiotics                                     -----------------------------                                           PCT <= 0.25 ng/mL  OR                                        > 80% decrease in PCT                                     Discontinue / Do not initiate                                             antibiotics Performed at [facility], [address] [place], [place] [address]   Blood gas, venous     Status: Abnormal   Collection Time: 05/16/19  9:29 PM  Result Value Ref Range   pH, Ven 7.34 7.250 - 7.430   pCO2, Ven 42 (L) 44.0 - 60.0 mmHg   pO2, Ven 52.0 (H) 32.0 - 45.0 mmHg   Bicarbonate 22.7 20.0 - 28.0 mmol/L   Acid-base deficit 3.0 (H) 0.0 - 2.0 mmol/L   O2 Saturation 84.0 %   Patient temperature 37.0    Collection site VENOUS    Sample type VENOUS     Comment: Performed at [facility], [address], [place], [place] [address]  Brain natriuretic peptide     Status: Abnormal   Collection  Time: 05/16/19  9:29 PM  Result Value Ref Range   B Natriuretic Peptide 260.0 (H) 0.0 - 100.0 pg/mL    Comment: Performed at [facility], [address] [place], [place] [address]  Troponin I - Add-On to previous collection     Status: None   Collection Time: 05/16/19  9:29 PM  Result Value Ref Range   Troponin I <0.03 <0.03 ng/mL    Comment: Performed at [facility], [address], [place], [place] [address]   Dg Chest Port 1 View  Result Date: 05/16/2019 CLINICAL DATA:  Initial evaluation for acute respiratory distress. Evaluate for pneumonia. EXAM: PORTABLE CHEST 1 VIEW COMPARISON:  Prior radiograph from 02/11/2019. FINDINGS: Transverse heart size at the upper limits of normal. Mediastinal silhouette within normal limits. Aortic atherosclerosis. Lungs normally inflated. Diffuse pulmonary interstitial prominence with vascular congestion, compatible with mild diffuse pulmonary interstitial edema. No pleural effusion. No consolidative opacity to suggest bronchopneumonia. No pneumothorax. No acute osseous finding. IMPRESSION: 1. Diffuse pulmonary interstitial prominence, compatible with mild diffuse pulmonary interstitial edema. 2. No consolidative opacity to suggest bronchopneumonia. 3. Aortic atherosclerosis. Electronically Signed   By: Jeannine Boga M.D.   On: 05/16/2019 21:52    Pending Labs Unresulted Labs (From admission, onward)    Start     Ordered   05/17/19 9518  Basic metabolic panel  Tomorrow morning,   STAT     05/16/19 2356   05/17/19 0500  CBC  Tomorrow morning,   STAT     05/16/19 2356   05/17/19 0158  Expectorated sputum assessment w rflx to resp cult  Once,   STAT     05/17/19 0157   05/17/19 0157  Magnesium  Once,   STAT    Comments:  For level less than 2 give 2 g of IV magnesium sulfate.    05/17/19 0156   05/16/19 2352  HIV antibody  Once,   STAT  05/16/19 2356   05/16/19 2126  Lactic acid, plasma  STAT Now then every 3 hours,   STAT      05/16/19 2126   05/16/19 2126  Urinalysis, Complete w Microscopic  ONCE - STAT,   STAT     05/16/19 2126          Vitals/Pain Today's Vitals   05/17/19 0230 05/17/19 0324 05/17/19 0330 05/17/19 0400  BP:   114/77 138/79  Pulse: 79  82 79  Resp: (!) 24  (!) 23 (!) 24  Temp:      TempSrc:      SpO2: 96%  93% 92%  Weight:      PainSc:  0-No pain      Isolation Precautions Droplet and Contact precautions  Medications Medications  traMADol (ULTRAM) tablet 50 mg (has no administration in time range)  atorvastatin (LIPITOR) tablet 80 mg (has no administration in time range)  carvedilol (COREG) tablet 6.25 mg (6.25 mg Oral Given 05/17/19 0145)  lisinopril (ZESTRIL) tablet 20 mg (has no administration in time range)  nitroGLYCERIN (NITROSTAT) SL tablet 0.4 mg (has no administration in time range)  ALPRAZolam (XANAX) tablet 2 mg (has no administration in time range)  busPIRone (BUSPAR) tablet 10 mg (has no administration in time range)  citalopram (CELEXA) tablet 40 mg (has no administration in time range)  DULoxetine (CYMBALTA) DR capsule 30 mg (has no administration in time range)  pantoprazole (PROTONIX) EC tablet 20 mg (has no administration in time range)  vitamin B-12 (CYANOCOBALAMIN) tablet 500 mcg (has no administration in time range)  ferrous sulfate tablet 325 mg (has no administration in time range)  gabapentin (NEURONTIN) capsule 300 mg (has no administration in time range)  pregabalin (LYRICA) capsule 150 mg (has no administration in time range)  multivitamin with minerals tablet 1 tablet (has no administration in time range)  Vitamin D (Ergocalciferol) (DRISDOL) capsule 50,000 Units (has no administration in time range)  loratadine (CLARITIN) tablet 10 mg (has no administration in time range)  benzocaine (ORAJEL) 10 % mucosal gel 1 application (has no administration in time range)  triamcinolone cream (KENALOG) 0.1 % (has no administration in time range)   enoxaparin (LOVENOX) injection 40 mg (has no administration in time range)  acetaminophen (TYLENOL) tablet 650 mg (650 mg Oral Given 05/17/19 0220)    Or  acetaminophen (TYLENOL) suppository 650 mg ( Rectal See Alternative 05/17/19 0220)  magnesium hydroxide (MILK OF MAGNESIA) suspension 30 mL (has no administration in time range)  ondansetron (ZOFRAN) tablet 4 mg (has no administration in time range)    Or  ondansetron (ZOFRAN) injection 4 mg (has no administration in time range)  guaiFENesin (MUCINEX) 12 hr tablet 600 mg (has no administration in time range)  cefTRIAXone (ROCEPHIN) 1 g in sodium chloride 0.9 % 100 mL IVPB (1 g Intravenous New Bag/Given 05/17/19 0144)  ipratropium-albuterol (DUONEB) 0.5-2.5 (3) MG/3ML nebulizer solution 3 mL (3 mLs Nebulization Given 05/17/19 0151)  potassium chloride (KLOR-CON) packet 40 mEq (has no administration in time range)  potassium chloride (KLOR-CON) packet 40 mEq (has no administration in time range)  methylPREDNISolone sodium succinate (SOLU-MEDROL) 125 mg/2 mL injection 60 mg (has no administration in time range)  furosemide (LASIX) injection 40 mg (has no administration in time range)  furosemide (LASIX) injection 60 mg (60 mg Intravenous Given 05/16/19 2314)  ipratropium-albuterol (DUONEB) 0.5-2.5 (3) MG/3ML nebulizer solution 3 mL (3 mLs Nebulization Given 05/16/19 2329)  hydrALAZINE (APRESOLINE) injection 10 mg (10 mg Intravenous Given  05/17/19 0147)    Mobility Walks  Low fall risk   Focused Assessments    R Recommendations: See Admitting Provider Note  Report given to:   Additional Notes:

## 2019-05-17 NOTE — Progress Notes (Signed)
Nutrition Brief Note RD working remotely.  Patient identified to be seen per COPD Gold protocol  Wt Readings from Last 15 Encounters:  05/17/19 60 kg  02/11/19 63.5 kg  11/19/18 62.6 kg  09/05/18 63.3 kg  07/27/18 54.4 kg  06/22/18 54.4 kg  04/02/18 59 kg  10/12/17 63.5 kg  09/30/17 61.2 kg  09/22/17 64.4 kg  08/27/17 64.5 kg  07/29/17 65.3 kg  06/09/17 63 kg  05/11/17 64.2 kg  04/07/17 62.6 kg   Spoke with patient over the phone. She reports her appetite is good and unchanged from baseline. She eats 3-4 small meals per day. She is eating 100% of her meals here per report (not documented). She reports she is weight-stable. Patient is currently 60 kg (132.2 lbs). Encouraged ongoing intake of adequate calories and protein at meals in setting of COPD. No nutrition questions or concerns at this time.  Body mass index is 20.71 kg/m. Patient meets criteria for normal weight based on current BMI.   Current diet order is heart healthy, patient is consuming approximately 100% of meals at this time. Labs and medications reviewed.   No nutrition interventions warranted at this time. If nutrition issues arise, please consult RD.   Willey Blade, MS, [place], LDN Office: [phone] Pager: [phone] After Hours/Weekend Pager: [phone]

## 2019-05-17 NOTE — Evaluation (Signed)
Physical Therapy Evaluation Patient Details Name: Traci Duran MRN: [id number] DOB: 12-12-1955 Today's Date: 05/17/2019   History of Present Illness  63 yo female with onset of CHF and respiratory distress/failure was admitted for same with pulm edema noted.  PMHx:  stroke L hemi, HTN, PAD, CAD, hip fracture, aortic atherosclerosis, anxiety,   Clinical Impression  Pt is up to walk on [facility] with O2 sats in 98-100% range for all longer trips.  Her plan is to progress with endurance and will take her on stairs with [facility] prior to DC as she is still being tested for heart function.  Her family is sporadically available, does live with a family member but they work and cannot be her support all the time.  Follow acutely for these goals, and will anticipate her completion of PT acutely in a week.    Follow Up Recommendations Outpatient PT    Equipment Recommendations  Cane    Recommendations for Other Services       Precautions / Restrictions Precautions Precautions: Fall Precaution Comments: ck O2 sats Restrictions Weight Bearing Restrictions: No      Mobility  Bed Mobility Overal bed mobility: Modified Independent                Transfers Overall transfer level: Modified independent               General transfer comment: pt used bed to push up to stand  Ambulation/Gait Ambulation/Gait assistance: Min guard(for safety) Gait Distance (Feet): 450 Feet(300+150) Assistive device: 1 person hand held assist;Straight cane Gait Pattern/deviations: Step-through pattern;Decreased stride length;Wide base of support Gait velocity: WFL Gait velocity interpretation: <1.31 ft/sec, indicative of household ambulator General Gait Details: pt had a couple times mild LOB to L with pt being able to regain, and then with SPC did not lose balance  Stairs            Wheelchair Mobility    Modified Rankin (Stroke Patients Only)       Balance Overall balance assessment:  Needs assistance Sitting-balance support: Feet supported Sitting balance-Leahy Scale: Good     Standing balance support: Single extremity supported Standing balance-Leahy Scale: Fair                               Pertinent Vitals/Pain Pain Assessment: Faces Faces Pain Scale: Hurts even more Pain Location: L thigh and lower legs, feet Pain Descriptors / Indicators: Stabbing;Tender Pain Intervention(s): Monitored during session;Premedicated before session;Repositioned;RN gave pain meds during session    Home Living Family/patient expects to be discharged to:: Private residence Living Arrangements: Other relatives Available Help at Discharge: Family;Available PRN/intermittently Type of Home: House Home Access: Stairs to enter Entrance Stairs-Rails: Can reach both;Right;Left Entrance Stairs-Number of Steps: 4 Home Layout: One level Home Equipment: None      Prior Function Level of Independence: Independent         Comments: no recent falls per pt     Hand Dominance   Dominant Hand: Right    Extremity/Trunk Assessment   Upper Extremity Assessment Upper Extremity Assessment: Overall WFL for tasks assessed    Lower Extremity Assessment Lower Extremity Assessment: Generalized weakness    Cervical / Trunk Assessment Cervical / Trunk Assessment: Normal  Communication   Communication: No difficulties  Cognition Arousal/Alertness: Awake/alert Behavior During Therapy: WFL for tasks assessed/performed Overall Cognitive Status: Within Functional Limits for tasks assessed  General Comments General comments (skin integrity, edema, etc.): SPC and outpatient therapy recommended as pt was walking with no AD prior to this admission, but is improved and maintaining O2 sats with gait without supplemental O2    Exercises     Assessment/Plan    PT Assessment Patient needs continued PT services  PT Problem  List Decreased strength;Decreased balance;Decreased coordination;Decreased safety awareness;Cardiopulmonary status limiting activity       PT Treatment Interventions DME instruction;Gait training;Stair training;Functional mobility training;Therapeutic activities;Therapeutic exercise;Neuromuscular re-education;Balance training;Patient/family education    PT Goals (Current goals can be found in the Care Plan section)  Acute Rehab PT Goals Patient Stated Goal: to go home PT Goal Formulation: With patient Time For Goal Achievement: 05/24/19 Potential to Achieve Goals: Good    Frequency Min 2X/week   Barriers to discharge Inaccessible home environment;Decreased caregiver support home alone and with stairs to enter    Co-evaluation               AM-PAC PT "6 Clicks" Mobility  Outcome Measure Help needed turning from your back to your side while in a flat bed without using bedrails?: None Help needed moving from lying on your back to sitting on the side of a flat bed without using bedrails?: None Help needed moving to and from a bed to a chair (including a wheelchair)?: None Help needed standing up from a chair using your arms (e.g., wheelchair or bedside chair)?: A Little Help needed to walk in hospital room?: A Little Help needed climbing 3-5 steps with a railing? : A Lot 6 Click Score: 20    End of Session Equipment Utilized During Treatment: Gait belt Activity Tolerance: Patient tolerated treatment well Patient left: in bed;with call bell/phone within reach;with bed alarm set Nurse Communication: Mobility status;Patient requests pain meds PT Visit Diagnosis: Unsteadiness on feet (R26.81);Muscle weakness (generalized) (M62.81)    Time: 5784-6962 PT Time Calculation (min) (ACUTE ONLY): 40 min   Charges:   PT Evaluation $PT Eval Moderate Complexity: 1 Mod PT Treatments $Gait Training: 8-22 mins $Therapeutic Exercise: 8-22 mins       Ivar Drape 05/17/2019, 12:18  PM  Samul Dada, PT MS Acute Rehab Dept. Number: [facility] [id number] and [facility] [phone]

## 2019-05-17 NOTE — Evaluation (Signed)
Occupational Therapy Evaluation Patient Details Name: Traci Duran MRN: [id number] DOB: 09-26-1956 Today's Date: 05/17/2019    History of Present Illness Pt. is a 63 yo female who was admitted to [facility] with CHF and respiratory distress/failure. PMHx:  stroke L hemi, HTN, PAD, CAD, hip fracture, aortic atherosclerosis, anxiety,    Clinical Impression   Pt. presents with limited activity tolerance. Pt.'s SO2 99% on RA, and HR 75 bpms.  Pt. resides at home with her grandson. Pt. was independent with ADLs, and IADL functioning: including meal preparation, and medication management. Pt. was independently able to walk to the bathroom, donn socks, and donn her gown. Pt. presented with no Loss of balance. Pt. reported dizziness. No change in vital signs. Pt. education was provided with education about energy conservation, work simplification, and pursed lip breathing.  No further OT services are warranted at this time, or follow-up OT services recommended at this time.     Follow Up Recommendations  No OT follow up    Equipment Recommendations       Recommendations for Other Services       Precautions / Restrictions Precautions Precautions: Fall Precaution Comments: ck O2 sats Restrictions Weight Bearing Restrictions: No      Mobility Bed Mobility Overal bed mobility: Independent                Transfers Overall transfer level: Independent               General transfer comment: pt used bed to push up to stand    Balance Overall balance assessment: Needs assistance Sitting-balance support: Feet supported Sitting balance-Leahy Scale: Good     Standing balance support: Single extremity supported Standing balance-Leahy Scale: Fair                             ADL either performed or assessed with clinical judgement   ADL Overall ADL's : Needs assistance/impaired Eating/Feeding: Independent   Grooming: Independent   Upper Body Bathing:  Independent   Lower Body Bathing: Independent   Upper Body Dressing : Independent   Lower Body Dressing: Independent   Toilet Transfer: Independent   Toileting- Clothing Manipulation and Hygiene: Independent         General ADL Comments: Pt. education was provided about energy conservation, work simplification techniques, and pursed lip breathing.     Vision Baseline Vision/History: Wears glasses Wears Glasses: Reading only       Perception     Praxis      Pertinent Vitals/Pain Pain Assessment: 0-10 Faces Pain Scale: Hurts even more Pain Location: L thigh and lower legs, feet Pain Descriptors / Indicators: Stabbing;Tender Pain Intervention(s): Monitored during session;Premedicated before session;Repositioned;RN gave pain meds during session     Hand Dominance Right   Extremity/Trunk Assessment Upper Extremity Assessment Upper Extremity Assessment: Overall WFL for tasks assessed         Communication Communication Communication: No difficulties   Cognition Arousal/Alertness: Awake/alert Behavior During Therapy: WFL for tasks assessed/performed Overall Cognitive Status: Within Functional Limits for tasks assessed                                     General Comments  SPC and outpatient therapy recommended as pt was walking with no AD prior to this admission, but is improved and maintaining O2 sats with gait without supplemental O2  Exercises    Shoulder Instructions      Home Living Family/patient expects to be discharged to:: Private residence Living Arrangements: Other relatives Available Help at Discharge: Family;Available PRN/intermittently Type of Home: House Home Access: Stairs to enter [employer] of Steps: 4 Entrance Stairs-Rails: Can reach both;Right;Left Home Layout: One level     Bathroom Shower/Tub: Arts development officer: Standard     Home Equipment: None          Prior  Functioning/Environment Level of Independence: Independent        Comments: no recent falls per pt        OT Problem List:        OT Treatment/Interventions: Self-care/ADL training;Therapeutic exercise;Patient/family education;DME and/or AE instruction    OT Goals(Current goals can be found in the care plan section) Acute Rehab OT Goals Patient Stated Goal: To return home OT Goal Formulation: With patient Potential to Achieve Goals: Good  OT Frequency: Min 2X/week   Barriers to D/C:            Co-evaluation              AM-PAC OT "6 Clicks" Daily Activity     Outcome Measure Help from another person eating meals?: None Help from another person taking care of personal grooming?: None Help from another person toileting, which includes using toliet, bedpan, or urinal?: None Help from another person bathing (including washing, rinsing, drying)?: None Help from another person to put on and taking off regular upper body clothing?: None Help from another person to put on and taking off regular lower body clothing?: None 6 Click Score: 24   End of Session Equipment Utilized During Treatment: Gait belt  Activity Tolerance: Patient tolerated treatment well Patient left: in bed(With Cardiology)  OT Visit Diagnosis: Muscle weakness (generalized) (M62.81)                Time: 1040-1103 OT Time Calculation (min): 23 min Charges:  OT General Charges $OT Visit: 1 Visit OT Evaluation $OT Eval Moderate Complexity: 1 Mod  Olegario Messier, MS, OTR/L  Olegario Messier 05/17/2019, 2:04 PM

## 2019-05-17 NOTE — ED Notes (Signed)
Pt asking to go to the bathroom. Unable to take pt off of O2 due to respiratory status. Pt offered a bed pan so she may have a BM. Pt declined. Offerred to pt again. Pt stated that she would hold it at this time.

## 2019-05-17 NOTE — Progress Notes (Signed)
*  PRELIMINARY RESULTS* Echocardiogram 2D Echocardiogram has been performed.  Traci Duran 05/17/2019, 2:23 PM

## 2019-05-17 NOTE — Progress Notes (Addendum)
[facility] [place] [facility]   PATIENT NAME: Traci Duran    MR#:  [id number]  DATE OF BIRTH:  11-03-1956  SUBJECTIVE:   Chief Complaint  Patient presents with  . Respiratory Distress   Patient is seen at the bedside. Patient is found sitting up in reclinerd in NAD. Overall she feels her condition is rapidly improving. Voices no new complaints. No new events reported overnight. She report that she is doing well and able to breath better without oxygen. Still with mild cough and wheezing.  REVIEW OF SYSTEMS:  Review of Systems  Constitutional: Negative for chills, fever, malaise/fatigue and weight loss.  HENT: Negative for congestion, hearing loss and sore throat.   Eyes: Negative for blurred vision and double vision.  Respiratory: Positive for cough, sputum production, shortness of breath and wheezing.   Cardiovascular: Negative for chest pain, palpitations, orthopnea and leg swelling.  Gastrointestinal: Negative for abdominal pain, diarrhea, nausea and vomiting.  Genitourinary: Negative for dysuria and urgency.  Musculoskeletal: Negative for myalgias.  Skin: Negative for rash.  Neurological: Negative for dizziness, sensory change, speech change, focal weakness and headaches.  Psychiatric/Behavioral: Positive for depression. The patient is nervous/anxious.    DRUG ALLERGIES:   Allergies  Allergen Reactions  . Chantix [Varenicline] Hives, Shortness Of Breath and Palpitations  . Diphenhydramine Hcl Shortness Of Breath  . Benadryl [Diphenhydramine Hcl] Rash  . Niacin Rash  . Trazodone Palpitations   VITALS:  Blood pressure 124/87, pulse 77, temperature 98.4 F (36.9 C), resp. rate 17, height 5\' 7"  (1.702 m), weight 60 kg, SpO2 95 %. PHYSICAL EXAMINATION:   GENERAL:  63 y.o.-year-old patient lying in the bed with no acute distress.  EYES: Pupils equal, round, reactive to light and accommodation. No scleral icterus. Extraocular muscles intact.   HEENT: Head atraumatic, normocephalic. Oropharynx and nasopharynx clear.  NECK:  Supple, no jugular venous distention. No thyroid enlargement, no tenderness.  LUNGS: Normal breath sounds bilaterally, mild wheezing, no rales,rhonchi or crepitation. No use of accessory muscles of respiration.  CARDIOVASCULAR: S1, S2 normal. No murmurs, rubs, or gallops.  ABDOMEN: Soft, nontender, nondistended. Bowel sounds present. No organomegaly or mass.  EXTREMITIES: No pedal edema, cyanosis, or clubbing. No rash or lesions. + pedal pulses MUSCULOSKELETAL: Normal bulk, and power was 5+ grip and elbow, knee, and ankle flexion and extension bilaterally.  NEUROLOGIC:Alert and oriented x 3. CN 2-12 intact. Sensation to light touch and cold stimuli intact bilaterally. Finger to nose nl. Babinski is downgoing. DTR's (biceps, patellar, and achilles) 2+ and symmetric throughout. Gait not tested due to safety concern. PSYCHIATRIC: The patient is alert and oriented x 3.  SKIN: No obvious rash, lesion, or ulcer.   DATA REVIEWED:  LABORATORY PANEL:  Female CBC Recent Labs  Lab 05/17/19 0507  WBC 8.8  HGB 10.8*  HCT 35.4*  PLT 303   ------------------------------------------------------------------------------------------------------------------ Chemistries  Recent Labs  Lab 05/16/19 2129 05/17/19 0507  NA 141 140  K 3.1* 3.0*  CL 111 105  CO2 22 24  GLUCOSE 101* 147*  BUN 9 12  CREATININE 0.64 0.75  CALCIUM 8.5* 9.4  MG  --  1.7  AST 18  --   ALT 14  --   ALKPHOS 93  --   BILITOT 0.6  --    RADIOLOGY:  Dg Chest Port 1 View  Result Date: 05/16/2019 CLINICAL DATA:  Initial evaluation for acute respiratory distress. Evaluate for pneumonia. EXAM: PORTABLE CHEST 1 VIEW COMPARISON:  Prior radiograph  from 02/11/2019. FINDINGS: Transverse heart size at the upper limits of normal. Mediastinal silhouette within normal limits. Aortic atherosclerosis. Lungs normally inflated. Diffuse pulmonary interstitial  prominence with vascular congestion, compatible with mild diffuse pulmonary interstitial edema. No pleural effusion. No consolidative opacity to suggest bronchopneumonia. No pneumothorax. No acute osseous finding. IMPRESSION: 1. Diffuse pulmonary interstitial prominence, compatible with mild diffuse pulmonary interstitial edema. 2. No consolidative opacity to suggest bronchopneumonia. 3. Aortic atherosclerosis. Electronically Signed   By: Rise Mu M.D.   On: 05/16/2019 21:52   ASSESSMENT AND PLAN:   63 y.o. female with history of COPD, CHF, CAD, hypertension, hyperlipidemia, tobacco abuse, depression and anxiety presenting with dyspnea associated with productive cough as well as wheezing.  1.  Acute respiratory failure secondary to CHF and AECOPD - Required BiPAP initially but now improved and is been weaned from oxygen to room air  2. AECOPD-  patient with hx of COPD not on home oxygen - Supplemental O2, goal sat 88-92% - Bronchodilators (albuterol/ipratropium) standing and PRN  - Continue Prednisone - Azithromycin  3. Acute on Chronic Diastolic Congestive Heart Failure: BNP mildly elevated 260  Last Echo 07/2017 with  EF 60-65% - Continue Lisinopril  - Beta-Blockade: Coreg - Diuretics: Furosemide 40mg  IV BID. Diureses >1L negative per day until approach euvolemia / worsening renal function. - Echocardiogram pending - Low salt diet  - Check daily weight - Strict I&Os - CHF Teaching  4. Hypokalemia - Repleted - Recheck in am   5. HLD + Goal LDL<70 - Atorvastatin 80mg  PO qhs   6. HTN- stable + Goal BP <140/90 - Beta-blocker: Metoprolol - ACE-Inhibitor: Lisinopril   7. Depression and Anxiety - Continue Cymbalta, Celexa, Buspar and Xanax  8. DVT prophylaxis - Enoxaparin    All the records are reviewed and case discussed with Care Management/Social Worker. Management plans discussed with the patient, family and they are in agreement.  CODE STATUS: Full  Code  TOTAL TIME TAKING CARE OF THIS PATIENT: 38 minutes.   More than 50% of the time was spent in counseling/coordination of care: YES  POSSIBLE D/C IN 1 DAYS, DEPENDING ON CLINICAL CONDITION.   on 05/17/2019 at 2:16 PM  This patient was staffed with Dr. Orpha Bur, [facility] who personally evaluated patient, reviewed documentation and agreed with assessment and plan of care as above.  Webb Silversmith, DNP, FNP-BC [facility] Nurse Practitioner   Between 7am to 6pm - Pager [phone]  After 6pm go to [email or web address] - Social research officer, government  [facility]  Office  [phone]  CC: Primary care physician; [name]  Note: This dictation was prepared with Dragon dictation along with smaller phrase technology. Any transcriptional errors that result from this process are unintentional.

## 2019-05-17 NOTE — Progress Notes (Signed)
Lab called with a Lactic Acid of 2.0. Dr called, no new orders given, will continue to monitor.

## 2019-05-17 NOTE — Consult Note (Signed)
Cardiology Consultation:   Patient ID: Traci Duran MRN: [id number]; DOB: May 14, 1956  Admit date: 05/16/2019 Date of Consult: 05/17/2019  Primary Care Provider: [facility] Primary Cardiologist: [facility] Primary Electrophysiologist:  None    Patient Profile:   Traci Duran is a 63 y.o. female with a hx of CAD, HFpEF, pulmonary HTN, CVD (12/04/2015) with prior stroke, PAD, renal artery stenosis, COPD 2/2 ongoing tobacco abuse, HTN, HLD, fall in 2016, and h/o GIB with gastritis who is being seen today for the evaluation of acute on chronic diastolic CHF at the request of Dr. Arville Care.  History of Present Illness:   Traci Duran is a 63 yo female with PMH as above. She has been followed by both [facility] as an outpatient in the past with most recent visit noted as 07/2016.   07/2008 stress test showed minimal reversible defect in the apex, although minimal area of ischemia cannot be excluded but thought to likely represent artifact, EF 59%.  2010 LHC with EF 85% 80% stenosis in the RPDA and AM., 60% stenosis in the RPAV, distal LAD and D2, 40% stenosis in the proximal and mid RCA, proximal LAD, and distal LAD.  Medical management was recommended.  2016 ischemic evaluation via stress test was without ischemia and EF estimated 76%.  2017 echo showed EF 55%, mild LVH, DD, normal RV systolic function, mild to moderate TR.  She was admitted to [facility] 03/2017 for HFpEF with anemia.  Her heart failure was believed to be in the setting of elevated BP.  She was seen by [facility] 07/2017 as an inpatient at [facility] and after recent admission d/t lower GI bleed with continuing GIB sx. Cardiology was consulted for associated chest pain with the ongoing GIB, as well as elevated troponin.  Initial troponin was elevated at 1.08.  Hemoglobin was 5.8 and potassium 2.7.  EKG showed sinus rhythm with LVH and inferior lateral ST segment changes that could be related to abnormal repolarization.  Prolonged  QT and ST segment changes were noted to be new compared with 05/28/2017.  Troponin elevation was thought at that time to be nonspecific and more representative of supply demand mismatch in the setting of profound anemia.  Echo showed mild to moderate LVH, mild concentric hypertrophy, EF 60 to 65%, no regional wall motion abnormalities, DD, moderate MR, moderate TR, PASP 61 mmHg.  She was not felt to be a good candidate for cardiac catheterization given her ongoing GI bleed; however, it was noted that she may need further ischemic work-up if chest pain continued after discharge and once Hgb stable.   On 05/16/2019, she presented to [facility] ED with complaint of sudden shortness of breath.  She reported the shortness of breath started 05/16/2019 and while eating canned peanuts in a chair. Per ED documentation, she reported progressive SOB, LEE, productive cough, and rhinorrhea over the last week; however, she denied this at time of cardiology consultation. She did note abdominal distention, however. She stated her acute episode of shortness of breath worried her, because it was similar to the shortness of breath she felt with her elevated troponin in the past.  She stated the shortness of breath improved with nebulizer treatment; however, she did notice that her anterior chest area felt sore for a few hours after her breathing issues resolved.  Initial labs showed potassium 3.8, creatinine 0.77, BUN 10, BNP 260.0, troponin negative x2, WBC 6.3, hemoglobin 11.0, RBC 4.37.  Lactic acid 1. Chest x-ray was significant  for diffuse pulmonary interstitial prominence, compatible with mild diffuse pulmonary interstitial edema, and aortic atherosclerosis.Vitals showed BP 152/82, HR 70, 93%  ORA. EKG was NSR 71bpm and without any acute ST/T wave changes. She was placed on BiPAP then 2L O2 with improvement in her sx. She was also administered 40mg  IV lasix and DuoNeb's, as well as IV Solu-Medrol.  Past Medical History:  Diagnosis  Date  . Anemia   . Anxiety   . COPD (chronic obstructive pulmonary disease) (HCC)   . Coronary artery disease   . Depression   . Gastritis   . GERD (gastroesophageal reflux disease)   . Hemorrhoids   . Hyperlipidemia   . Hypertension   . PAD (peripheral artery disease) (HCC)     Past Surgical History:  Procedure Laterality Date  . APPENDECTOMY    . COLONOSCOPY WITH PROPOFOL N/A 06/09/2017   Procedure: COLONOSCOPY WITH PROPOFOL;  Surgeon: Midge Minium, MD;  Location: [facility] ENDOSCOPY;  Service: Endoscopy;  Laterality: N/A;  . ESOPHAGOGASTRODUODENOSCOPY N/A 07/30/2017   Procedure: ESOPHAGOGASTRODUODENOSCOPY (EGD);  Surgeon: Toney Reil, MD;  Location: [facility] ENDOSCOPY;  Service: Gastroenterology;  Laterality: N/A;  . ESOPHAGOGASTRODUODENOSCOPY (EGD) WITH PROPOFOL N/A 04/09/2017   Procedure: ESOPHAGOGASTRODUODENOSCOPY (EGD) WITH PROPOFOL;  Surgeon: Midge Minium, MD;  Location: [facility] ENDOSCOPY;  Service: Endoscopy;  Laterality: N/A;  . HIP FRACTURE SURGERY       Home Medications:  Prior to Admission medications   Medication Sig Start Date End Date Taking? Authorizing Provider  acetaminophen (TYLENOL) 325 MG tablet Take 3 tablets (975 mg total) by mouth 3 (three) times daily as needed. 02/11/19   Sharman Cheek, MD  albuterol (PROVENTIL HFA;VENTOLIN HFA) [phone] Base) MCG/ACT inhaler Inhale 2 puffs into the lungs every 6 (six) hours as needed for wheezing or shortness of breath. 04/02/18   Willy Eddy, MD  alprazolam Prudy Feeler) 2 MG tablet Take 2 mg by mouth 2 (two) times daily.     [provider]  atorvastatin (LIPITOR) 80 MG tablet Take 80 mg by mouth daily.    [provider]  benzocaine (ORAJEL) 10 % mucosal gel Use as directed 1 application in the mouth or throat as needed for mouth pain. 02/11/19   Sharman Cheek, MD  betamethasone valerate ointment (VALISONE) 0.1 % Apply 1 application topically 2 (two) times daily. 08/19/18   [provider]  busPIRone  (BUSPAR) 10 MG tablet Take 10 mg by mouth 2 (two) times daily.     [provider]  carvedilol (COREG) 6.25 MG tablet Take 6.25 mg by mouth 2 (two) times daily. 05/28/17   [provider]  cetirizine (ZYRTEC) 10 MG tablet Take 10 mg by mouth daily. 08/12/18   [provider]  citalopram (CELEXA) 40 MG tablet Take 40 mg by mouth daily.    [provider]  DULoxetine (CYMBALTA) 30 MG capsule Take 30 mg by mouth daily. 05/28/17   [provider]  ferrous sulfate 325 (65 FE) MG tablet Take 1 tablet (325 mg total) by mouth 3 (three) times daily with meals. Patient not taking: Reported on 11/19/2018 07/30/17   Enedina Finner, MD  gabapentin (NEURONTIN) 300 MG capsule Take 300 mg by mouth 3 (three) times daily.     [provider]  lisinopril (PRINIVIL,ZESTRIL) 20 MG tablet Take 20 mg by mouth daily.      [provider]  Multiple Vitamin (MULTIVITAMIN WITH MINERALS) TABS tablet Take 2 tablets by mouth daily.    [provider]  nitroGLYCERIN (  NITROSTAT) 0.4 MG SL tablet Place 0.4 mg under the tongue every 5 (five) minutes as needed.     [provider]  pantoprazole (PROTONIX) 20 MG tablet Take 20 mg by mouth daily.    [provider]  pregabalin (LYRICA) 150 MG capsule Take 150 mg by mouth 2 (two) times daily. 11/18/18 11/18/19  [provider]  SYMBICORT 80-4.5 MCG/ACT inhaler Inhale 2 puffs into the lungs 2 (two) times daily. 08/12/18   [provider]  traMADol (ULTRAM) 50 MG tablet Take 1 tablet (50 mg total) by mouth 3 (three) times daily as needed for moderate pain. 11/21/18   Enid Baas, MD  vitamin B-12 (CYANOCOBALAMIN) 500 MCG tablet Take 1 tablet (500 mcg total) by mouth daily. 11/21/18   Enid Baas, MD  Vitamin D, Ergocalciferol, (DRISDOL) 50000 units CAPS capsule Take 50,000 Units by mouth every Saturday.  05/01/17   [provider]    Inpatient Medications: Scheduled  Meds: . ALPRAZolam  2 mg Oral BID  . atorvastatin  80 mg Oral Daily  . busPIRone  10 mg Oral BID  . carvedilol  6.25 mg Oral BID  . citalopram  40 mg Oral Daily  . DULoxetine  30 mg Oral Daily  . enoxaparin (LOVENOX) injection  40 mg Subcutaneous Q24H  . ferrous sulfate  325 mg Oral TID WC  . furosemide  40 mg Intravenous Q12H  . gabapentin  300 mg Oral TID  . guaiFENesin  600 mg Oral BID  . ipratropium-albuterol  3 mL Nebulization Q6H  . lisinopril  20 mg Oral Daily  . loratadine  10 mg Oral Daily  . methylPREDNISolone (SOLU-MEDROL) injection  60 mg Intravenous Q8H  . multivitamin with minerals  1 tablet Oral Daily  . pantoprazole  20 mg Oral Daily  . potassium chloride  40 mEq Oral Once  . potassium chloride  40 mEq Oral Once  . pregabalin  150 mg Oral BID  . vitamin B-12  500 mcg Oral Daily  . [START ON 05/21/2019] Vitamin D (Ergocalciferol)  50,000 Units Oral Q Sat   Continuous Infusions: . cefTRIAXone (ROCEPHIN)  IV 1 g (05/17/19 0144)   PRN Meds: acetaminophen **OR** acetaminophen, benzocaine, magnesium hydroxide, nitroGLYCERIN, ondansetron **OR** ondansetron (ZOFRAN) IV, traMADol  Allergies:    Allergies  Allergen Reactions  . Chantix [Varenicline] Hives, Shortness Of Breath and Palpitations  . Diphenhydramine Hcl Shortness Of Breath  . Benadryl [Diphenhydramine Hcl] Rash  . Niacin Rash  . Trazodone Palpitations    Social History:   Social History   Socioeconomic History  . Marital status: Widowed    Spouse name: Not on file  . Number of children: Not on file  . Years of education: Not on file  . Highest education level: Not on file  Occupational History  . Not on file  Social Needs  . Financial resource strain: Not hard at all  . Food insecurity:    Worry: Never true    Inability: Never true  . Transportation needs:    Medical: No    Non-medical: No  Tobacco Use  . Smoking status: Current Some Day Smoker    Packs/day: 1.00    Years: 46.00    Pack  years: 46.00    Types: Cigarettes  . Smokeless tobacco: Never Used  . Tobacco comment: she feels that the 21 nicotine made her jittery and we suggested a lower level patch  Substance and Sexual Activity  . Alcohol use: No  . Drug  use: Yes    Types: Marijuana    Comment: monthly  . Sexual activity: Not Currently  Lifestyle  . Physical activity:    Days per week: 0 days    Minutes per session: 0 min  . Stress: Very much  Relationships  . Social connections:    Talks on phone: Patient refused    Gets together: Patient refused    Attends religious service: Patient refused    Active member of club or organization: Patient refused    Attends meetings of clubs or organizations: Patient refused    Relationship status: Patient refused  . Intimate partner violence:    Fear of current or ex partner: Patient refused    Emotionally abused: Patient refused    Physically abused: Patient refused    Forced sexual activity: Patient refused  Other Topics Concern  . Not on file  Social History Narrative  . Not on file    Family History:    Family History  Problem Relation Age of Onset  . Heart failure Mother   . Heart attack Mother        mother died at 47 of an MI  . Heart attack Father        father died at 41 of an MI  . Heart attack Brother        Brother had an MI in his 36s     ROS:  Please see the history of present illness.  Review of Systems  Respiratory: Positive for shortness of breath. Negative for cough, hemoptysis and wheezing.   Cardiovascular: Positive for chest pain. Negative for leg swelling.       Chest soreness after acute issue breathing, not current  Gastrointestinal: Negative for nausea and vomiting.  Musculoskeletal: Negative for falls.  Neurological: Negative for loss of consciousness.  All other systems reviewed and are negative.   All other ROS reviewed and negative.     Physical Exam/Data:   Vitals:   05/17/19 0400 05/17/19 0456 05/17/19 0500  05/17/19 0804  BP: 138/79 (!) 142/84  124/87  Pulse: 79 73  77  Resp: (!) 24 20  17   Temp:  97.7 F (36.5 C)  98.4 F (36.9 C)  TempSrc:  Oral    SpO2: 92% 98%  95%  Weight:   60 kg   Height:   5\' 7"  (1.702 m)     Intake/Output Summary (Last 24 hours) at 05/17/2019 1121 Last data filed at 05/17/2019 1023 Gross per 24 hour  Intake 100 ml  Output 400 ml  Net -300 ml   Filed Weights   05/16/19 2134 05/17/19 0500  Weight: 63.5 kg 60 kg   Body mass index is 20.71 kg/m.  General:  Well nourished, well developed, in no acute distress HEENT: normal. Not on oxygen Neck: no JVD Vascular: No carotid bruits; FA pulses 2+ bilaterally without bruits  Cardiac:  normal S1, S2; RRR; 2/6 holosystolic murmur Lungs:  clear to auscultation bilaterally, no wheezing, rhonchi or rales  Abd: soft, nontender, no hepatomegaly  Ext: no edema Musculoskeletal:  No deformities, BUE and BLE strength normal and equal Skin: warm and dry  Neuro:  CNs 2-12 intact, no focal abnormalities noted Psych:  Normal affect   EKG:  The EKG was personally reviewed and demonstrates: EKG was NSR 71bpm and without any acute ST/T wave changes.  Telemetry:  Telemetry was personally reviewed and demonstrates:  SR  Relevant CV Studies: Pending updated echo  TTE 07/2017 Study Conclusions -  Left ventricle: The cavity size was normal. Wall thickness was   increased in a pattern of mild to moderate LVH. There was mild   concentric hypertrophy. Systolic function was normal. The   estimated ejection fraction was in the range of 60% to 65%. Wall   motion was normal; there were no regional wall motion   abnormalities. Findings consistent with left ventricular   diastolic dysfunction. - Mitral valve: There was moderate regurgitation. - Left atrium: The atrium was normal in size. - Right ventricle: Systolic function was normal. - Tricuspid valve: There was moderate regurgitation. - Pulmonary arteries: Systolic pressure was  moderate to severely   elevated. PA peak pressure: 61 mm Hg (S).  Laboratory Data:  Chemistry Recent Labs  Lab 05/16/19 2129 05/17/19 0507  NA 141 140  K 3.1* 3.0*  CL 111 105  CO2 22 24  GLUCOSE 101* 147*  BUN 9 12  CREATININE 0.64 0.75  CALCIUM 8.5* 9.4  GFRNONAA >60 >60  GFRAA >60 >60  ANIONGAP 8 11    Recent Labs  Lab 05/16/19 2129  PROT 6.7  ALBUMIN 3.6  AST 18  ALT 14  ALKPHOS 93  BILITOT 0.6   Hematology Recent Labs  Lab 05/16/19 2129 05/17/19 0507  WBC 9.3 8.8  RBC 3.95 4.45  HGB 9.7* 10.8*  HCT 32.1* 35.4*  MCV 81.3 79.6*  MCH 24.6* 24.3*  MCHC 30.2 30.5  RDW 14.9 14.9  PLT 273 303   Cardiac Enzymes Recent Labs  Lab 05/16/19 2129  TROPONINI <0.03   No results for input(s): TROPIPOC in the last 168 hours.  BNP Recent Labs  Lab 05/16/19 2129  BNP 260.0*    DDimer No results for input(s): DDIMER in the last 168 hours.  Radiology/Studies:  Dg Chest Port 1 View  Result Date: 05/16/2019 CLINICAL DATA:  Initial evaluation for acute respiratory distress. Evaluate for pneumonia. EXAM: PORTABLE CHEST 1 VIEW COMPARISON:  Prior radiograph from 02/11/2019. FINDINGS: Transverse heart size at the upper limits of normal. Mediastinal silhouette within normal limits. Aortic atherosclerosis. Lungs normally inflated. Diffuse pulmonary interstitial prominence with vascular congestion, compatible with mild diffuse pulmonary interstitial edema. No pleural effusion. No consolidative opacity to suggest bronchopneumonia. No pneumothorax. No acute osseous finding. IMPRESSION: 1. Diffuse pulmonary interstitial prominence, compatible with mild diffuse pulmonary interstitial edema. 2. No consolidative opacity to suggest bronchopneumonia. 3. Aortic atherosclerosis. Electronically Signed   By: Rise Mu M.D.   On: 05/16/2019 21:52    Assessment and Plan:   Respiratory Distress with h/o HFpEF and COPD - Etiology of SOB likely multifactorial and in setting of  known HFpEF, COPD, and current tobacco abuse. BNP 260.  - Relatively euvolemic on exam and s/p IV lasix received in the ED. Consider transition to oral diuresis / lasix 20mg  qd as patient is not terribly volume overloaded on exam at this time. Recommend discharge with low dose lasix 20mg  as below on daily or PRN basis as well. - 2018 echo as above with EF normal, mild hypertrophy, DD, moderate MR, elevated PASP.  - Updating echo. If significant drop in EF, consider further ischemic workup given risk factors for cardiac ischemia and per primary [facility]. Of note, patient indicated she is currently unsure if she would wish to proceed with any invasive workup or procedures at this time. - Renal function stable. Daily BMET given diuresis. Cr 0.64 and BUN 9. Replete potassium as below. - Continue medical management with Coreg 6.25mg  BID, lisinopril 20mg  daily. Consider addition  of low dose lasix 20mg  qd or on a PRN basis at discharge. Consider also potassium supplementation at discharge given hypokalemia this admission with follow-up BMET within 1 week of discharge.   Hypokalemia - Replete with goal 4.0.  - Check Mg. - Daily BMET - As above, consider potassium supplementation with diuretic at discharge.  Moderate MR/TR - Updating echo.  - As above, at this time, patient indicated she is currently unsure regarding proceeding with any invasive workup or procedures if indicated.  - Continue to monitor valvular disease on echo in follow-up.   HTN - Continue medical management with BB, lasix, and lisinopril for optimal BP control and titrate as needed.  CAD - No current chest pain. - Troponin negative. - Pending repeat echo. As above, if EF reduced / acute changes on echo, recommend further ischemic workup per primary [facility].  - Continue aggressive risk factor modification. Continue statin therapy. Optimal BP control recommended. Smoking cessation recommended as below.  - Continue  medical management. No plan for further ischemic workup this admission.  Tobacco abuse - Cessation advised  For questions or updates, please contact [facility] Please consult [email or web address] for contact info under     Signed, Lennon Alstrom, PA-C  05/17/2019 11:21 AM

## 2019-05-18 LAB — CBC WITH DIFFERENTIAL/PLATELET
Abs Immature Granulocytes: 0.16 10*3/uL — ABNORMAL HIGH (ref 0.00–0.07)
Basophils Absolute: 0 10*3/uL (ref 0.0–0.1)
Basophils Relative: 0 %
Eosinophils Absolute: 0 10*3/uL (ref 0.0–0.5)
Eosinophils Relative: 0 %
HCT: 33.6 % — ABNORMAL LOW (ref 36.0–46.0)
Hemoglobin: 10.3 g/dL — ABNORMAL LOW (ref 12.0–15.0)
Immature Granulocytes: 1 %
Lymphocytes Relative: 8 %
Lymphs Abs: 1.6 10*3/uL (ref 0.7–4.0)
MCH: 24.5 pg — ABNORMAL LOW (ref 26.0–34.0)
MCHC: 30.7 g/dL (ref 30.0–36.0)
MCV: 80 fL (ref 80.0–100.0)
Monocytes Absolute: 1.1 10*3/uL — ABNORMAL HIGH (ref 0.1–1.0)
Monocytes Relative: 6 %
Neutro Abs: 16.2 10*3/uL — ABNORMAL HIGH (ref 1.7–7.7)
Neutrophils Relative %: 85 %
Platelets: 344 10*3/uL (ref 150–400)
RBC: 4.2 MIL/uL (ref 3.87–5.11)
RDW: 15.2 % (ref 11.5–15.5)
WBC: 19.1 10*3/uL — ABNORMAL HIGH (ref 4.0–10.5)
nRBC: 0 % (ref 0.0–0.2)

## 2019-05-18 LAB — BASIC METABOLIC PANEL
Anion gap: 12 (ref 5–15)
BUN: 27 mg/dL — ABNORMAL HIGH (ref 8–23)
CO2: 24 mmol/L (ref 22–32)
Calcium: 9.4 mg/dL (ref 8.9–10.3)
Chloride: 101 mmol/L (ref 98–111)
Creatinine, Ser: 1.1 mg/dL — ABNORMAL HIGH (ref 0.44–1.00)
GFR calc Af Amer: >60 mL/min (ref >60)
GFR calc non Af Amer: 54 mL/min — ABNORMAL LOW (ref >60)
Glucose, Bld: 115 mg/dL — ABNORMAL HIGH (ref 70–99)
Potassium: 4.6 mmol/L (ref 3.5–5.1)
Sodium: 137 mmol/L (ref 135–145)

## 2019-05-18 LAB — HIV ANTIBODY (ROUTINE TESTING W REFLEX): HIV Screen 4th Generation wRfx: NONREACTIVE

## 2019-05-18 MED ORDER — AZITHROMYCIN 250 MG PO TABS: 500.0000 mg | ORAL_TABLET | Freq: Every day | ORAL | 0 refills | Status: DC

## 2019-05-18 MED ORDER — FUROSEMIDE 40 MG PO TABS: 40.0000 mg | ORAL_TABLET | Freq: Every day | ORAL | 0 refills | Status: DC

## 2019-05-18 MED ORDER — IPRATROPIUM-ALBUTEROL 0.5-2.5 (3) MG/3ML IN SOLN: 3.0000 mL | Freq: Four times a day (QID) | RESPIRATORY_TRACT | 0 refills | Status: DC | PRN

## 2019-05-18 MED ORDER — TRAMADOL HCL 50 MG PO TABS: 50.0000 mg | ORAL_TABLET | Freq: Four times a day (QID) | ORAL | 0 refills | Status: DC | PRN

## 2019-05-18 MED ORDER — FUROSEMIDE 40 MG PO TABS
40.0000 mg | ORAL_TABLET | Freq: Every day | ORAL | Status: DC
  Administered 2019-05-18: 40 mg via ORAL
  Filled 2019-05-18: qty 1

## 2019-05-18 MED ORDER — ALBUTEROL SULFATE (2.5 MG/3ML) 0.083% IN NEBU: 2.5000 mg | INHALATION_SOLUTION | Freq: Four times a day (QID) | RESPIRATORY_TRACT | 0 refills | Status: AC | PRN

## 2019-05-18 MED ORDER — PREDNISONE 50 MG PO TABS: 50.0000 mg | ORAL_TABLET | Freq: Every day | ORAL | 0 refills | Status: AC

## 2019-05-18 NOTE — Discharge Summary (Addendum)
[facility] [place] at [facility]   PATIENT NAME: Maraiya Bernath    MR#:  [id number]  DATE OF BIRTH:  [birth year]  DATE OF ADMISSION:  05/16/2019   ADMITTING PHYSICIAN: Hannah Beat, MD  DATE OF DISCHARGE: 05/18/19  PRIMARY CARE PHYSICIAN: [place], [facility]   ADMISSION DIAGNOSIS:   Acute respiratory failure with hypoxia (HCC) [J96.01]  DISCHARGE DIAGNOSIS:   Active Problems:   Acute CHF (congestive heart failure) (HCC)  SECONDARY DIAGNOSIS:   Past Medical History:  Diagnosis Date  . Anemia   . Anxiety   . COPD (chronic obstructive pulmonary disease) (HCC)   . Coronary artery disease   . Depression   . Gastritis   . GERD (gastroesophageal reflux disease)   . Hemorrhoids   . Hyperlipidemia   . Hypertension   . PAD (peripheral artery disease) [facility])     HOSPITAL COURSE:   63 y.o. female with history of COPD, CHF, CAD, hypertension, hyperlipidemia, tobacco abuse, depression and anxiety presenting with dyspnea associated with productive cough as well as wheezing.  1.  Acute respiratory failure secondary to CHF and AECOPD - Required BiPAP initially but now improved and is been weaned from oxygen to room air  2. AECOPD-  patient with hx of COPDnot onhome oxygen - Continue Prednisone x 3 more days - Continue Azithromycin x 3 more days  3. Acute on ChronicDiastolic Congestive Heart Failure: BNP mildly elevated 260 Last Echo 07/2017 withEF 60-65% - Continue Lisinopril - Beta-Blockade: Coreg - Diuretics: Furosemide 40mg /daily - Echocardiogram pending - Low salt diet - Check daily weight - Strict I&Os - CHF Teaching - Follow up with CHF clinic and Cardiology Dr. Sharen Hones at [facility]  4. Hypokalemia - Repleted - Recheck in am  5.HLD + Goal LDL<70 - Atorvastatin 80mg  PO qhs  6.HTN- stable + Goal BP <140/90 - Beta-blocker: Metoprolol - ACE-Inhibitor: Lisinopril   7. Depression and Anxiety - Continue Cymbalta, Celexa,  Buspar and Xanax  DISCHARGE CONDITIONS:   stable CONSULTS OBTAINED:   Treatment Team:  Yvonne Kendall, MD  DRUG ALLERGIES:   Allergies  Allergen Reactions  . Chantix [Varenicline] Hives, Shortness Of Breath and Palpitations  . Diphenhydramine Hcl Shortness Of Breath  . Potassium Itching and Swelling  . Benadryl [Diphenhydramine Hcl] Rash  . Niacin Rash  . Trazodone Palpitations   DISCHARGE MEDICATIONS:   Allergies as of 05/18/2019      Reactions   Chantix [varenicline] Hives, Shortness Of Breath, Palpitations   Diphenhydramine Hcl Shortness Of Breath   Potassium Itching, Swelling   Benadryl [diphenhydramine Hcl] Rash   Niacin Rash   Trazodone Palpitations      Medication List    STOP taking these medications   albuterol 108 (90 Base) MCG/ACT inhaler Commonly known as:  VENTOLIN HFA Replaced by:  albuterol (2.5 MG/3ML) 0.083% nebulizer solution   ferrous sulfate 325 (65 FE) MG tablet     TAKE these medications   acetaminophen 325 MG tablet Commonly known as:  TYLENOL Take 3 tablets (975 mg total) by mouth 3 (three) times daily as needed.   albuterol (2.5 MG/3ML) 0.083% nebulizer solution Commonly known as:  PROVENTIL Take 3 mLs (2.5 mg total) by nebulization every 6 (six) hours as needed for wheezing or shortness of breath. Replaces:  albuterol 108 (90 Base) MCG/ACT inhaler   alprazolam 2 MG tablet Commonly known as:  XANAX Take 2 mg by mouth 2 (two) times daily.   atorvastatin 80 MG tablet Commonly known as:  LIPITOR Take 80 mg by mouth daily.   azithromycin 250 MG tablet Commonly known as:  ZITHROMAX Take 2 tablets (500 mg total) by mouth daily.   busPIRone 10 MG tablet Commonly known as:  BUSPAR Take 10 mg by mouth 2 (two) times daily.   carvedilol 6.25 MG tablet Commonly known as:  COREG Take 6.25 mg by mouth 2 (two) times daily.   citalopram 40 MG tablet Commonly known as:  CELEXA Take 40 mg by mouth daily.   DULoxetine 30 MG  capsule Commonly known as:  CYMBALTA Take 30 mg by mouth daily.   furosemide 40 MG tablet Commonly known as:  LASIX Take 1 tablet (40 mg total) by mouth daily. Start taking on:  May 19, 2019   gabapentin 300 MG capsule Commonly known as:  NEURONTIN Take 300 mg by mouth 2 (two) times daily.   ipratropium-albuterol 0.5-2.5 (3) MG/3ML Soln Commonly known as:  DUONEB Take 3 mLs by nebulization every 6 (six) hours as needed.   lisinopril 20 MG tablet Commonly known as:  ZESTRIL Take 20 mg by mouth daily.   pantoprazole 20 MG tablet Commonly known as:  PROTONIX Take 20 mg by mouth daily.   predniSONE 50 MG tablet Commonly known as:  DELTASONE Take 1 tablet (50 mg total) by mouth daily for 3 days.   pregabalin 150 MG capsule Commonly known as:  LYRICA Take 150 mg by mouth 2 (two) times daily.   Symbicort 80-4.5 MCG/ACT inhaler Generic drug:  budesonide-formoterol Inhale 2 puffs into the lungs 2 (two) times daily.   Vitamin D (Ergocalciferol) 1.25 MG (50000 UT) Caps capsule Commonly known as:  DRISDOL Take 50,000 Units by mouth every Saturday.            Durable Medical Equipment  (From admission, onward)         Start     Ordered   05/17/19 1642  For home use only DME Nebulizer machine  Once    Question:  Patient needs a nebulizer to treat with the following condition  Answer:  COPD (chronic obstructive pulmonary disease) (HCC)   05/17/19 1641         DISCHARGE INSTRUCTIONS:   DIET:   Cardiac diet  ACTIVITY:   Activity as tolerated  OXYGEN:   Home Oxygen: No.  Oxygen Delivery: room air  DISCHARGE LOCATION:   home   If you experience worsening of your admission symptoms, develop shortness of breath, life threatening emergency, suicidal or homicidal thoughts you must seek medical attention immediately by calling 911 or calling your MD immediately  if symptoms less severe.  You Must read complete instructions/literature along with all the  possible adverse reactions/side effects for all the Medicines you take and that have been prescribed to you. Take any new Medicines after you have completely understood and accpet all the possible adverse reactions/side effects.   Please note  You were cared for by a hospitalist during your hospital stay. If you have any questions about your discharge medications or the care you received while you were in the hospital after you are discharged, you can call the unit and asked to speak with the hospitalist on call if the hospitalist that took care of you is not available. Once you are discharged, your primary care physician will handle any further medical issues. Please note that NO REFILLS for any discharge medications will be authorized once you are discharged, as it is imperative that you return to your primary care physician (  or establish a relationship with a primary care physician if you do not have one) for your aftercare needs so that they can reassess your need for medications and monitor your lab values.  On the day of Discharge:  VITAL SIGNS:   Blood pressure 132/77, pulse 79, temperature 98.4 F (36.9 C), temperature source Oral, resp. rate 18, height 5\' 7"  (1.702 m), weight 61.5 kg, SpO2 96 %.  PHYSICAL EXAMINATION:   GENERAL:  63 y.o.-year-old patient lying in the bed with no acute distress.  EYES: Pupils equal, round, reactive to light and accommodation. No scleral icterus. Extraocular muscles intact.  HEENT: Head atraumatic, normocephalic. Oropharynx and nasopharynx clear.  NECK:  Supple, no jugular venous distention. No thyroid enlargement, no tenderness.  LUNGS: Normal breath sounds bilaterally, mild wheezing, no rales,rhonchi or crepitation. No use of accessory muscles of respiration.  CARDIOVASCULAR: S1, S2 normal. No murmurs, rubs, or gallops.  ABDOMEN: Soft, nontender, nondistended. Bowel sounds present. No organomegaly or mass.  EXTREMITIES: No pedal edema, cyanosis, or  clubbing. No rash or lesions. + pedal pulses MUSCULOSKELETAL: Normal bulk, and power was 5+ grip and elbow, knee, and ankle flexion and extension bilaterally.  NEUROLOGIC:Alert and oriented x 3. CN 2-12 intact. Sensation to light touch and cold stimuli intact bilaterally. Finger to nose nl. Babinski is downgoing. DTR's (biceps, patellar, and achilles) 2+ and symmetric throughout. Gait not tested due to safety concern. PSYCHIATRIC: The patient is alert and oriented x 3.  SKIN: No obvious rash, lesion, or ulcer.   DATA REVIEW:   CBC Recent Labs  Lab 05/18/19 0317  WBC 19.1*  HGB 10.3*  HCT 33.6*  PLT 344    Chemistries  Recent Labs  Lab 05/16/19 2129 05/17/19 0507 05/18/19 0317  NA 141 140 137  K 3.1* 3.0* 4.6  CL 111 105 101  CO2 22 24 24   GLUCOSE 101* 147* 115*  BUN 9 12 27*  CREATININE 0.64 0.75 1.10*  CALCIUM 8.5* 9.4 9.4  MG  --  1.7  --   AST 18  --   --   ALT 14  --   --   ALKPHOS 93  --   --   BILITOT 0.6  --   --      Microbiology Results  Results for orders placed or performed during the hospital encounter of 05/16/19  SARS Coronavirus 2 (CEPHEID- Performed in [facility] hospital lab), Hosp Order     Status: None   Collection Time: 05/16/19  9:29 PM  Result Value Ref Range Status   SARS Coronavirus 2 NEGATIVE NEGATIVE Final    Comment: (NOTE) If result is NEGATIVE SARS-CoV-2 target nucleic acids are NOT DETECTED. The SARS-CoV-2 RNA is generally detectable in upper and lower  respiratory specimens during the acute phase of infection. The lowest  concentration of SARS-CoV-2 viral copies this assay can detect is 250  copies / mL. A negative result does not preclude SARS-CoV-2 infection  and should not be used as the sole basis for treatment or other  patient management decisions.  A negative result may occur with  improper specimen collection / handling, submission of specimen other  than nasopharyngeal swab, presence of viral mutation(s) within the   areas targeted by this assay, and inadequate number of viral copies  (<250 copies / mL). A negative result must be combined with clinical  observations, patient history, and epidemiological information. If result is POSITIVE SARS-CoV-2 target nucleic acids are DETECTED. The SARS-CoV-2 RNA is generally detectable in  upper and lower  respiratory specimens dur ing the acute phase of infection.  Positive  results are indicative of active infection with SARS-CoV-2.  Clinical  correlation with patient history and other diagnostic information is  necessary to determine patient infection status.  Positive results do  not rule out bacterial infection or co-infection with other viruses. If result is PRESUMPTIVE POSTIVE SARS-CoV-2 nucleic acids MAY BE PRESENT.   A presumptive positive result was obtained on the submitted specimen  and confirmed on repeat testing.  While 2019 novel coronavirus  (SARS-CoV-2) nucleic acids may be present in the submitted sample  additional confirmatory testing may be necessary for epidemiological  and / or clinical management purposes  to differentiate between  SARS-CoV-2 and other Sarbecovirus currently known to infect humans.  If clinically indicated additional testing with an alternate test  methodology [id number]) is advised. The SARS-CoV-2 RNA is generally  detectable in upper and lower respiratory sp ecimens during the acute  phase of infection. The expected result is Negative. Fact Sheet for Patients:  BoilerBrush.com.cy Fact Sheet for Healthcare Providers: https://pope.com/ This test is not yet approved or cleared by the [place] FDA and has been authorized for detection and/or diagnosis of SARS-CoV-2 by FDA under an Emergency Use Authorization (EUA).  This EUA will remain in effect (meaning this test can be used) for the duration of the COVID-19 declaration under Section 564(b)(1) of the Act, 21  U.S.C. section 360bbb-3(b)(1), unless the authorization is terminated or revoked sooner. Performed at [facility], [address] [place], [place] [address]   Blood Culture (routine x 2)     Status: None (Preliminary result)   Collection Time: 05/16/19  9:29 PM  Result Value Ref Range Status   Specimen Description BLOOD BLOOD RIGHT FOREARM  Final   Special Requests   Final    BOTTLES DRAWN AEROBIC AND ANAEROBIC Blood Culture results may not be optimal due to an excessive volume of blood received in culture bottles   Culture   Final    NO GROWTH 2 DAYS Performed at [facility], [address], [place], [place] [address]    Report Status PENDING  Incomplete  Blood Culture (routine x 2)     Status: None (Preliminary result)   Collection Time: 05/16/19  9:29 PM  Result Value Ref Range Status   Specimen Description BLOOD BLOOD LEFT FOREARM  Final   Special Requests   Final    BOTTLES DRAWN AEROBIC AND ANAEROBIC Blood Culture results may not be optimal due to an excessive volume of blood received in culture bottles   Culture   Final    NO GROWTH 2 DAYS Performed at [facility], [address] [place], [place] [address]    Report Status PENDING  Incomplete    RADIOLOGY:  No results found.   Management plans discussed with the patient, family and they are in agreement.  CODE STATUS:     Code Status Orders  (From admission, onward)         Start     Ordered   05/16/19 2352  Full code  Continuous     05/16/19 2356        Code Status History    Date Active Date Inactive Code Status Order ID Comments User Context   11/19/2018 1829 11/21/2018 2220 Full Code [id number]  Enedina Finner, MD Inpatient   09/03/2018 1856 09/05/2018 1659 Full Code [id number]  Katha Hamming, MD ED   10/11/2017 0559 10/12/2017 1348  Full Code [id number]  Arnaldo Natal, MD Inpatient   07/28/2017 1413 07/30/2017 2315 Full Code [id number]  Milagros Loll, MD ED    04/07/2017 0022 04/09/2017 1710 Full Code [id number]  Ihor Austin, MD Inpatient   10/14/2011 2215 10/15/2011 1727 Full Code [id number]  Mays, Joyice Faster, RN Inpatient      TOTAL TIME TAKING CARE OF THIS PATIENT: 38 minutes.   This patient was staffed with Dr. Orpha Bur, [facility] who personally evaluated patient, reviewed documentation and agreed with discharge plan of care as above.  Webb Silversmith, DNP, FNP-BC Hospitalist Nurse Practitioner   05/18/2019 at 11:02 AM  Between 7am to 6pm - Pager - [phone]  After 6pm go to [email or web address] - Social research officer, government  [facility] [place] [facility]  Office  [phone]  CC: Primary care physician; [name]   Note: This dictation was prepared with Dragon dictation along with smaller phrase technology. Any transcriptional errors that result from this process are unintentional.

## 2019-05-18 NOTE — TOC Transition Note (Signed)
Transition of Care [facility]) - CM/SW Discharge Note   Patient Details  Name: Traci Duran MRN: [id number] Date of Birth: 1956/01/14  Transition of Care [facility]) CM/SW Contact:  Traci Ludwig, LCSW Phone Number: 05/18/2019, 4:12 PM   Clinical Narrative:     Patient needed a nebulizer and a cane.  CSW contacted [employer], and DME was delivered.  Patient did not have any other needs.   Final next level of care: Home/Self Care Barriers to Discharge: Barriers Resolved   Patient Goals and CMS Choice Patient states their goals for this hospitalization and ongoing recovery are:: To  return back home CMS Medicare.gov Compare Post Acute Care list provided to:: Patient Choice offered to / list presented to : Patient  Discharge Placement                       Discharge Plan and Services                DME Arranged: Nebulizer/meds, [name] DME Agency: [employer] Date DME Agency Contacted: 05/18/19 Time DME Agency Contacted: 53 Representative spoke with at DME Agency: [place]: NA [place] Agency: NA        Social Determinants of Health ([place]) Interventions     Readmission Risk Interventions No flowsheet data found.

## 2019-05-18 NOTE — Progress Notes (Signed)
Pts O2 sats stayed at 96-97% when ambulating. Pt had no c/o SOB when ambulating.

## 2019-05-18 NOTE — Progress Notes (Signed)
Pt discharged home via private car at 1515. Pt was A&Ox4. VSS. Pt left with purse, cell phone, clothes, nebulizer, and cane. AVS reviewed with pt and all questions were answered. Pt verbalized understanding of discharge instructions. Additional education on heart failure given to pt as well. Pt wheeled downstairs by this RN.

## 2019-05-21 LAB — CULTURE, BLOOD (ROUTINE X 2)
Culture: NO GROWTH
Culture: NO GROWTH

## 2019-05-26 ENCOUNTER — Other Ambulatory Visit: Payer: Self-pay

## 2019-05-26 ENCOUNTER — Ambulatory Visit: Payer: Medicare Other | Attending: Family | Admitting: Family

## 2019-05-26 ENCOUNTER — Encounter: Payer: Self-pay | Admitting: Family

## 2019-05-26 VITALS — BP 107/71 | HR 79 | Temp 98.6°F | Resp 18 | Ht 67.0 in | Wt 140.0 lb

## 2019-05-26 DIAGNOSIS — F419 Anxiety disorder, unspecified: Secondary | ICD-10-CM | POA: Insufficient documentation

## 2019-05-26 DIAGNOSIS — J449 Chronic obstructive pulmonary disease, unspecified: Secondary | ICD-10-CM | POA: Diagnosis not present

## 2019-05-26 DIAGNOSIS — I10 Essential (primary) hypertension: Secondary | ICD-10-CM

## 2019-05-26 DIAGNOSIS — Z888 Allergy status to other drugs, medicaments and biological substances status: Secondary | ICD-10-CM | POA: Diagnosis not present

## 2019-05-26 DIAGNOSIS — I2510 Atherosclerotic heart disease of native coronary artery without angina pectoris: Secondary | ICD-10-CM | POA: Diagnosis not present

## 2019-05-26 DIAGNOSIS — K219 Gastro-esophageal reflux disease without esophagitis: Secondary | ICD-10-CM | POA: Diagnosis not present

## 2019-05-26 DIAGNOSIS — M545 Low back pain: Secondary | ICD-10-CM | POA: Insufficient documentation

## 2019-05-26 DIAGNOSIS — Z79899 Other long term (current) drug therapy: Secondary | ICD-10-CM | POA: Diagnosis not present

## 2019-05-26 DIAGNOSIS — E785 Hyperlipidemia, unspecified: Secondary | ICD-10-CM | POA: Diagnosis not present

## 2019-05-26 DIAGNOSIS — F329 Major depressive disorder, single episode, unspecified: Secondary | ICD-10-CM | POA: Diagnosis not present

## 2019-05-26 DIAGNOSIS — I110 Hypertensive heart disease with heart failure: Secondary | ICD-10-CM | POA: Insufficient documentation

## 2019-05-26 DIAGNOSIS — F17210 Nicotine dependence, cigarettes, uncomplicated: Secondary | ICD-10-CM | POA: Insufficient documentation

## 2019-05-26 DIAGNOSIS — Z7951 Long term (current) use of inhaled steroids: Secondary | ICD-10-CM | POA: Diagnosis not present

## 2019-05-26 DIAGNOSIS — I509 Heart failure, unspecified: Secondary | ICD-10-CM | POA: Diagnosis present

## 2019-05-26 DIAGNOSIS — Z8249 Family history of ischemic heart disease and other diseases of the circulatory system: Secondary | ICD-10-CM | POA: Insufficient documentation

## 2019-05-26 DIAGNOSIS — I5032 Chronic diastolic (congestive) heart failure: Secondary | ICD-10-CM | POA: Insufficient documentation

## 2019-05-26 DIAGNOSIS — Z720 Tobacco use: Secondary | ICD-10-CM

## 2019-05-26 DIAGNOSIS — I739 Peripheral vascular disease, unspecified: Secondary | ICD-10-CM | POA: Diagnosis not present

## 2019-05-26 NOTE — Progress Notes (Signed)
Patient ID: Traci Duran, female    DOB: 02-29-1956, 63 y.o.   MRN: [id number]  HPI  Ms Kountz is a 63 y/o female with a history of CAD, hyperlipidemia, HTN, PAD, depression, COPD, anxiety, anemia, current tobacco use and chronic heart failure.   Echo report from 05/17/2019 reviewed and showed an EF of 55-60% along with mild/moderate MR/ MS and an elevated PA pressure of 52.6 mmHg.  Admitted 05/16/2019 due to HF/ COPD exacerbation. Cardiology consult obtained. Initially needed bipap but was able to be weaned down to room air. Was given prednisone and antibiotics. Discharged after 2 days.    She presents today for her initial visit with a chief complaint of moderate fatigue upon minimal exertion. She describes this as chronic in nature having been present for several years. She has associated shortness of breath, pedal edema and left lower back pain. She denies any difficulty sleeping, abdominal distention, palpitations, chest pain, cough, dizziness or weight gain.   Past Medical History:  Diagnosis Date  . Anemia   . Anxiety   . CHF (congestive heart failure) (HCC)   . COPD (chronic obstructive pulmonary disease) (HCC)   . Coronary artery disease   . Depression   . Gastritis   . GERD (gastroesophageal reflux disease)   . Hemorrhoids   . Hyperlipidemia   . Hypertension   . PAD (peripheral artery disease) (HCC)    Past Surgical History:  Procedure Laterality Date  . APPENDECTOMY    . COLONOSCOPY WITH PROPOFOL N/A 06/09/2017   Procedure: COLONOSCOPY WITH PROPOFOL;  Surgeon: Midge Minium, MD;  Location: [facility] ENDOSCOPY;  Service: Endoscopy;  Laterality: N/A;  . ESOPHAGOGASTRODUODENOSCOPY N/A 07/30/2017   Procedure: ESOPHAGOGASTRODUODENOSCOPY (EGD);  Surgeon: Toney Reil, MD;  Location: [facility] ENDOSCOPY;  Service: Gastroenterology;  Laterality: N/A;  . ESOPHAGOGASTRODUODENOSCOPY (EGD) WITH PROPOFOL N/A 04/09/2017   Procedure: ESOPHAGOGASTRODUODENOSCOPY (EGD) WITH PROPOFOL;  Surgeon:  Midge Minium, MD;  Location: [facility] ENDOSCOPY;  Service: Endoscopy;  Laterality: N/A;  . HIP FRACTURE SURGERY     Family History  Problem Relation Age of Onset  . Heart failure Mother   . Heart attack Mother        mother died at 23 of an MI  . Heart attack Father        father died at 72 of an MI  . Heart attack Brother        Brother had an MI in his 63s   Social History   Tobacco Use  . Smoking status: Current Some Day Smoker    Packs/day: 1.00    Years: 46.00    Pack years: 46.00    Types: Cigarettes  . Smokeless tobacco: Never Used  . Tobacco comment: she feels that the 21 nicotine made her jittery and we suggested a lower level patch  Substance Use Topics  . Alcohol use: No   Allergies  Allergen Reactions  . Chantix [Varenicline] Hives, Shortness Of Breath and Palpitations  . Diphenhydramine Hcl Shortness Of Breath  . Potassium Itching and Swelling  . Benadryl [Diphenhydramine Hcl] Rash  . Niacin Rash  . Trazodone Palpitations   Prior to Admission medications   Medication Sig Start Date End Date Taking? Authorizing Provider  acetaminophen (TYLENOL) 325 MG tablet Take 3 tablets (975 mg total) by mouth 3 (three) times daily as needed. 02/11/19  Yes Sharman Cheek, MD  albuterol (PROVENTIL) (2.5 MG/3ML) 0.083% nebulizer solution Take 3 mLs (2.5 mg total) by nebulization every 6 (six) hours as needed  for wheezing or shortness of breath. 05/18/19  Yes Mayo, Allyn Kenner, MD  alprazolam Prudy Feeler) 2 MG tablet Take 2 mg by mouth 2 (two) times daily.    Yes [provider]  atorvastatin (LIPITOR) 80 MG tablet Take 80 mg by mouth daily.   Yes [provider]  azithromycin (ZITHROMAX) 250 MG tablet Take 2 tablets (500 mg total) by mouth daily. 05/18/19  Yes Jimmye Norman, NP  busPIRone (BUSPAR) 10 MG tablet Take 10 mg by mouth 2 (two) times daily.    Yes [provider]  carvedilol (COREG) 6.25 MG tablet Take 6.25 mg by mouth 2 (two) times daily.  05/28/17  Yes [provider]  citalopram (CELEXA) 40 MG tablet Take 40 mg by mouth daily.   Yes [provider]  DULoxetine (CYMBALTA) 30 MG capsule Take 30 mg by mouth daily. 05/28/17  Yes [provider]  furosemide (LASIX) 40 MG tablet Take 1 tablet (40 mg total) by mouth daily. Patient taking differently: Take 80 mg by mouth daily.  05/19/19  Yes Jimmye Norman, NP  gabapentin (NEURONTIN) 300 MG capsule Take 300 mg by mouth 2 (two) times daily.    Yes [provider]  ipratropium-albuterol (DUONEB) 0.5-2.5 (3) MG/3ML SOLN Take 3 mLs by nebulization every 6 (six) hours as needed. 05/18/19  Yes Jimmye Norman, NP  lisinopril (PRINIVIL,ZESTRIL) 20 MG tablet Take 20 mg by mouth daily.     Yes [provider]  pantoprazole (PROTONIX) 20 MG tablet Take 20 mg by mouth daily.   Yes [provider]  pregabalin (LYRICA) 150 MG capsule Take 150 mg by mouth 2 (two) times daily. 11/18/18 11/18/19 Yes [provider]  SYMBICORT 80-4.5 MCG/ACT inhaler Inhale 2 puffs into the lungs 2 (two) times daily. 08/12/18  Yes [provider]  traMADol (ULTRAM) 50 MG tablet Take 1 tablet (50 mg total) by mouth every 6 (six) hours as needed. 05/18/19  Yes Jimmye Norman, NP  Vitamin D, Ergocalciferol, (DRISDOL) 50000 units CAPS capsule Take 50,000 Units by mouth every Saturday.  05/01/17  Yes [provider]    Review of Systems  Constitutional: Positive for fatigue. Negative for appetite change.  HENT: Negative for congestion, postnasal drip and sore throat.   Eyes: Negative.   Respiratory: Positive for shortness of breath. Negative for cough.   Cardiovascular: Positive for leg swelling. Negative for chest pain and palpitations.  Gastrointestinal: Negative for abdominal distention and abdominal pain.  Endocrine: Negative.   Genitourinary: Negative.   Musculoskeletal: Positive for back pain (left lower back).  Negative for neck pain.       Pain over left lower back  Skin: Negative.   Allergic/Immunologic: Negative.   Neurological: Negative.  Negative for dizziness and light-headedness.  Hematological: Negative for adenopathy. Does not bruise/bleed easily.  Psychiatric/Behavioral: Negative for dysphoric mood and sleep disturbance. The patient is not nervous/anxious.     Vitals:   05/26/19 1318  BP: 107/71  Pulse: 79  Resp: 18  Temp: 98.6 F (37 C)  SpO2: 98%  Weight: 140 lb (63.5 kg)  Height: 5\' 7"  (1.702 m)   Wt Readings from Last 3 Encounters:  05/26/19 140 lb (63.5 kg)  05/18/19 135 lb 9.6 oz (61.5 kg)  02/11/19 140 lb (63.5 kg)   Lab Results  Component Value Date   CREATININE 1.10 (H) 05/18/2019   CREATININE 0.75 05/17/2019   CREATININE 0.64 05/16/2019    Physical Exam Vitals signs and nursing note  reviewed.  Constitutional:      Appearance: Normal appearance.  HENT:     Head: Normocephalic and atraumatic.  Neck:     Musculoskeletal: Normal range of motion and neck supple.  Cardiovascular:     Rate and Rhythm: Normal rate and regular rhythm.  Pulmonary:     Effort: Pulmonary effort is normal. No respiratory distress.     Breath sounds: No wheezing or rales.  Abdominal:     General: There is no distension.     Palpations: Abdomen is soft.  Musculoskeletal:     Right lower leg: No edema.     Left lower leg: No edema.  Skin:    General: Skin is warm and dry.  Neurological:     Mental Status: She is alert and oriented to person, place, and time. Mental status is at baseline.  Psychiatric:        Mood and Affect: Mood normal.        Behavior: Behavior normal.   Assessment & Plan:  1: Chronic heart failure with preserved ejection fraction- - NYHA class III - euvolemic today - weighing daily and says that her weight has been stable; instructed to call for an overnight weight gain of >2 pounds or a weekly weight gain of >5 pounds - not adding salt to her food but  says that she hasn't been reading food labels for sodium content; reviewed the importance of closely following a 2000mg  sodium diet and written information was provided to her - has seen cardiology Sharen Hones) with [facility] in the past; scheduled to see him 05/31/2019 - BNP 05/16/2019 was 260.0  2: HTN- - BP looks good today - saw PCP (Behling) 05/24/2019 - BMP 05/18/2019 reviewed and showed sodium 137, potassium 4.6, creatinine 1.10 and GFR 54  3: COPD- - using nebulizer  4: Tobacco use- - smoking 1/2 ppd of cigarettes daily - complete cessation discussed for 3 minutes with her  Patient did not bring her medications nor a list. Each medication was verbally reviewed with the patient and she was encouraged to bring the bottles to every visit to confirm accuracy of list.  Return in 6 weeks or sooner for any questions/problems before then.

## 2019-05-26 NOTE — Patient Instructions (Addendum)
Continue weighing daily and call for an overnight weight gain of > 2 pounds or a weekly weight gain of >5 pounds. 

## 2019-07-07 ENCOUNTER — Ambulatory Visit: Payer: Medicare Other | Admitting: Family

## 2019-10-06 ENCOUNTER — Emergency Department: Payer: Medicare Other

## 2019-10-06 ENCOUNTER — Encounter: Payer: Self-pay | Admitting: Emergency Medicine

## 2019-10-06 ENCOUNTER — Inpatient Hospital Stay
Admission: EM | Admit: 2019-10-06 | Discharge: 2019-10-09 | DRG: 391 | Disposition: A | Discharge status: Home / Self Care | Payer: Medicare Other | Attending: Internal Medicine | Admitting: Internal Medicine

## 2019-10-06 ENCOUNTER — Other Ambulatory Visit: Payer: Self-pay

## 2019-10-06 DIAGNOSIS — K5521 Angiodysplasia of colon with hemorrhage: Secondary | ICD-10-CM | POA: Diagnosis present

## 2019-10-06 DIAGNOSIS — R079 Chest pain, unspecified: Secondary | ICD-10-CM

## 2019-10-06 DIAGNOSIS — R195 Other fecal abnormalities: Secondary | ICD-10-CM

## 2019-10-06 DIAGNOSIS — K2970 Gastritis, unspecified, without bleeding: Secondary | ICD-10-CM | POA: Diagnosis present

## 2019-10-06 DIAGNOSIS — K640 First degree hemorrhoids: Secondary | ICD-10-CM | POA: Diagnosis present

## 2019-10-06 DIAGNOSIS — D649 Anemia, unspecified: Secondary | ICD-10-CM

## 2019-10-06 DIAGNOSIS — K047 Periapical abscess without sinus: Secondary | ICD-10-CM | POA: Diagnosis present

## 2019-10-06 DIAGNOSIS — G629 Polyneuropathy, unspecified: Secondary | ICD-10-CM | POA: Diagnosis present

## 2019-10-06 DIAGNOSIS — I110 Hypertensive heart disease with heart failure: Secondary | ICD-10-CM | POA: Diagnosis present

## 2019-10-06 DIAGNOSIS — K228 Other specified diseases of esophagus: Secondary | ICD-10-CM | POA: Diagnosis present

## 2019-10-06 DIAGNOSIS — E785 Hyperlipidemia, unspecified: Secondary | ICD-10-CM | POA: Diagnosis present

## 2019-10-06 DIAGNOSIS — Z20828 Contact with and (suspected) exposure to other viral communicable diseases: Secondary | ICD-10-CM | POA: Diagnosis present

## 2019-10-06 DIAGNOSIS — D62 Acute posthemorrhagic anemia: Secondary | ICD-10-CM | POA: Diagnosis present

## 2019-10-06 DIAGNOSIS — E876 Hypokalemia: Secondary | ICD-10-CM | POA: Diagnosis present

## 2019-10-06 DIAGNOSIS — F418 Other specified anxiety disorders: Secondary | ICD-10-CM | POA: Diagnosis present

## 2019-10-06 DIAGNOSIS — I252 Old myocardial infarction: Secondary | ICD-10-CM | POA: Diagnosis not present

## 2019-10-06 DIAGNOSIS — F17210 Nicotine dependence, cigarettes, uncomplicated: Secondary | ICD-10-CM | POA: Diagnosis present

## 2019-10-06 DIAGNOSIS — K449 Diaphragmatic hernia without obstruction or gangrene: Secondary | ICD-10-CM | POA: Diagnosis present

## 2019-10-06 DIAGNOSIS — K635 Polyp of colon: Secondary | ICD-10-CM | POA: Diagnosis present

## 2019-10-06 DIAGNOSIS — I2510 Atherosclerotic heart disease of native coronary artery without angina pectoris: Secondary | ICD-10-CM | POA: Diagnosis present

## 2019-10-06 DIAGNOSIS — D509 Iron deficiency anemia, unspecified: Secondary | ICD-10-CM | POA: Diagnosis present

## 2019-10-06 DIAGNOSIS — K254 Chronic or unspecified gastric ulcer with hemorrhage: Secondary | ICD-10-CM | POA: Diagnosis present

## 2019-10-06 DIAGNOSIS — K5731 Diverticulosis of large intestine without perforation or abscess with bleeding: Secondary | ICD-10-CM | POA: Diagnosis present

## 2019-10-06 DIAGNOSIS — Z7951 Long term (current) use of inhaled steroids: Secondary | ICD-10-CM

## 2019-10-06 DIAGNOSIS — Z8249 Family history of ischemic heart disease and other diseases of the circulatory system: Secondary | ICD-10-CM | POA: Diagnosis not present

## 2019-10-06 DIAGNOSIS — Z23 Encounter for immunization: Secondary | ICD-10-CM

## 2019-10-06 DIAGNOSIS — I5032 Chronic diastolic (congestive) heart failure: Secondary | ICD-10-CM | POA: Diagnosis present

## 2019-10-06 DIAGNOSIS — J449 Chronic obstructive pulmonary disease, unspecified: Secondary | ICD-10-CM | POA: Diagnosis present

## 2019-10-06 DIAGNOSIS — Q2733 Arteriovenous malformation of digestive system vessel: Secondary | ICD-10-CM

## 2019-10-06 HISTORY — DX: Acute myocardial infarction, unspecified: I21.9

## 2019-10-06 LAB — CBC WITH DIFFERENTIAL/PLATELET
Abs Immature Granulocytes: 0.05 10*3/uL (ref 0.00–0.07)
Basophils Absolute: 0.1 10*3/uL (ref 0.0–0.1)
Basophils Relative: 1 %
Eosinophils Absolute: 0.2 10*3/uL (ref 0.0–0.5)
Eosinophils Relative: 2 %
HCT: 26 % — ABNORMAL LOW (ref 36.0–46.0)
Hemoglobin: 7.6 g/dL — ABNORMAL LOW (ref 12.0–15.0)
Immature Granulocytes: 0 %
Lymphocytes Relative: 26 %
Lymphs Abs: 3 10*3/uL (ref 0.7–4.0)
MCH: 21.1 pg — ABNORMAL LOW (ref 26.0–34.0)
MCHC: 29.2 g/dL — ABNORMAL LOW (ref 30.0–36.0)
MCV: 72 fL — ABNORMAL LOW (ref 80.0–100.0)
Monocytes Absolute: 0.9 10*3/uL (ref 0.1–1.0)
Monocytes Relative: 7 %
Neutro Abs: 7.5 10*3/uL (ref 1.7–7.7)
Neutrophils Relative %: 64 %
Platelets: 306 10*3/uL (ref 150–400)
RBC: 3.61 MIL/uL — ABNORMAL LOW (ref 3.87–5.11)
RDW: 16.9 % — ABNORMAL HIGH (ref 11.5–15.5)
WBC: 11.7 10*3/uL — ABNORMAL HIGH (ref 4.0–10.5)
nRBC: 0 % (ref 0.0–0.2)

## 2019-10-06 LAB — BASIC METABOLIC PANEL
Anion gap: 12 (ref 5–15)
BUN: 11 mg/dL (ref 8–23)
CO2: 29 mmol/L (ref 22–32)
Calcium: 8.8 mg/dL — ABNORMAL LOW (ref 8.9–10.3)
Chloride: 99 mmol/L (ref 98–111)
Creatinine, Ser: 0.89 mg/dL (ref 0.44–1.00)
GFR calc Af Amer: >60 mL/min (ref >60)
GFR calc non Af Amer: >60 mL/min (ref >60)
Glucose, Bld: 96 mg/dL (ref 70–99)
Potassium: 2.6 mmol/L — CL (ref 3.5–5.1)
Sodium: 140 mmol/L (ref 135–145)

## 2019-10-06 LAB — IRON AND TIBC
Iron: 7 ug/dL — ABNORMAL LOW (ref 28–170)
Saturation Ratios: 2 % — ABNORMAL LOW (ref 10.4–31.8)
TIBC: 348 ug/dL (ref 250–450)
UIBC: 341 ug/dL

## 2019-10-06 LAB — PREPARE RBC (CROSSMATCH)

## 2019-10-06 LAB — TROPONIN I (HIGH SENSITIVITY)
Troponin I (High Sensitivity): 4 ng/L (ref <18)
Troponin I (High Sensitivity): 4 ng/L (ref <18)

## 2019-10-06 LAB — FERRITIN: Ferritin: 5 ng/mL — ABNORMAL LOW (ref 11–307)

## 2019-10-06 LAB — MAGNESIUM: Magnesium: 1.8 mg/dL (ref 1.7–2.4)

## 2019-10-06 MED ORDER — LISINOPRIL 20 MG PO TABS
20.0000 mg | ORAL_TABLET | Freq: Every day | ORAL | Status: DC
  Administered 2019-10-07 – 2019-10-09 (×3): 20 mg via ORAL
  Filled 2019-10-06 (×3): qty 1

## 2019-10-06 MED ORDER — NICOTINE 7 MG/24HR TD PT24
7.0000 mg | MEDICATED_PATCH | Freq: Every day | TRANSDERMAL | Status: DC
  Administered 2019-10-06 – 2019-10-08 (×3): 7 mg via TRANSDERMAL
  Filled 2019-10-06 (×5): qty 1

## 2019-10-06 MED ORDER — CITALOPRAM HYDROBROMIDE 20 MG PO TABS
40.0000 mg | ORAL_TABLET | Freq: Every day | ORAL | Status: DC
  Administered 2019-10-07 – 2019-10-09 (×3): 40 mg via ORAL
  Filled 2019-10-06 (×3): qty 2

## 2019-10-06 MED ORDER — ACETAMINOPHEN 650 MG RE SUPP: 650.0000 mg | Freq: Four times a day (QID) | RECTAL | Status: DC | PRN

## 2019-10-06 MED ORDER — FUROSEMIDE 40 MG PO TABS
40.0000 mg | ORAL_TABLET | Freq: Every day | ORAL | Status: DC
  Administered 2019-10-07 – 2019-10-09 (×3): 40 mg via ORAL
  Filled 2019-10-06 (×3): qty 1

## 2019-10-06 MED ORDER — CARVEDILOL 3.125 MG PO TABS
6.2500 mg | ORAL_TABLET | Freq: Two times a day (BID) | ORAL | Status: DC
  Administered 2019-10-07 – 2019-10-09 (×5): 6.25 mg via ORAL
  Filled 2019-10-06 (×5): qty 2

## 2019-10-06 MED ORDER — ATORVASTATIN CALCIUM 20 MG PO TABS
80.0000 mg | ORAL_TABLET | Freq: Every day | ORAL | Status: DC
  Administered 2019-10-07 – 2019-10-09 (×3): 80 mg via ORAL
  Filled 2019-10-06 (×3): qty 4

## 2019-10-06 MED ORDER — BUSPIRONE HCL 10 MG PO TABS
10.0000 mg | ORAL_TABLET | Freq: Two times a day (BID) | ORAL | Status: DC
  Administered 2019-10-06 – 2019-10-09 (×6): 10 mg via ORAL
  Filled 2019-10-06 (×4): qty 1
  Filled 2019-10-06: qty 2
  Filled 2019-10-06 (×2): qty 1

## 2019-10-06 MED ORDER — ALPRAZOLAM 0.5 MG PO TABS
2.0000 mg | ORAL_TABLET | Freq: Once | ORAL | Status: AC
  Administered 2019-10-06: 18:00:00 2 mg via ORAL
  Filled 2019-10-06: qty 4

## 2019-10-06 MED ORDER — POTASSIUM CHLORIDE CRYS ER 20 MEQ PO TBCR
40.0000 meq | EXTENDED_RELEASE_TABLET | Freq: Once | ORAL | Status: AC
  Administered 2019-10-06: 18:00:00 40 meq via ORAL
  Filled 2019-10-06: qty 2

## 2019-10-06 MED ORDER — TRAMADOL HCL 50 MG PO TABS
50.0000 mg | ORAL_TABLET | Freq: Four times a day (QID) | ORAL | Status: DC | PRN
  Administered 2019-10-06 – 2019-10-09 (×6): 50 mg via ORAL
  Filled 2019-10-06 (×6): qty 1

## 2019-10-06 MED ORDER — AMOXICILLIN-POT CLAVULANATE 875-125 MG PO TABS
1.0000 | ORAL_TABLET | Freq: Two times a day (BID) | ORAL | Status: DC
  Administered 2019-10-06 – 2019-10-09 (×6): 1 via ORAL
  Filled 2019-10-06 (×6): qty 1

## 2019-10-06 MED ORDER — ONDANSETRON HCL 4 MG/2ML IJ SOLN: 4.0000 mg | Freq: Four times a day (QID) | INTRAMUSCULAR | Status: DC | PRN

## 2019-10-06 MED ORDER — DULOXETINE HCL 30 MG PO CPEP
30.0000 mg | ORAL_CAPSULE | Freq: Every day | ORAL | Status: DC
  Administered 2019-10-07 – 2019-10-09 (×3): 30 mg via ORAL
  Filled 2019-10-06 (×3): qty 1

## 2019-10-06 MED ORDER — ONDANSETRON HCL 4 MG PO TABS: 4.0000 mg | ORAL_TABLET | Freq: Four times a day (QID) | ORAL | Status: DC | PRN

## 2019-10-06 MED ORDER — ACETAMINOPHEN 325 MG PO TABS: 650.0000 mg | ORAL_TABLET | Freq: Four times a day (QID) | ORAL | Status: DC | PRN

## 2019-10-06 MED ORDER — GABAPENTIN 300 MG PO CAPS
300.0000 mg | ORAL_CAPSULE | Freq: Two times a day (BID) | ORAL | Status: DC
  Administered 2019-10-06 – 2019-10-09 (×6): 300 mg via ORAL
  Filled 2019-10-06 (×6): qty 1

## 2019-10-06 MED ORDER — POTASSIUM CHLORIDE CRYS ER 20 MEQ PO TBCR
40.0000 meq | EXTENDED_RELEASE_TABLET | Freq: Once | ORAL | Status: AC
  Administered 2019-10-06: 23:00:00 40 meq via ORAL
  Filled 2019-10-06: qty 2

## 2019-10-06 MED ORDER — FUROSEMIDE 10 MG/ML IJ SOLN
40.0000 mg | Freq: Once | INTRAMUSCULAR | Status: AC
  Administered 2019-10-06: 21:00:00 40 mg via INTRAVENOUS
  Filled 2019-10-06: qty 4

## 2019-10-06 MED ORDER — ACETAMINOPHEN 325 MG PO TABS: 975.0000 mg | ORAL_TABLET | Freq: Three times a day (TID) | ORAL | Status: DC | PRN

## 2019-10-06 MED ORDER — SODIUM CHLORIDE 0.9 % IV SOLN
10.0000 mL/h | Freq: Once | INTRAVENOUS | Status: AC
  Administered 2019-10-06: 21:00:00 10 mL/h via INTRAVENOUS

## 2019-10-06 MED ORDER — MOMETASONE FURO-FORMOTEROL FUM 100-5 MCG/ACT IN AERO
2.0000 | INHALATION_SPRAY | Freq: Two times a day (BID) | RESPIRATORY_TRACT | Status: DC
  Administered 2019-10-07 – 2019-10-09 (×5): 2 via RESPIRATORY_TRACT
  Filled 2019-10-06: qty 8.8

## 2019-10-06 MED ORDER — IPRATROPIUM-ALBUTEROL 0.5-2.5 (3) MG/3ML IN SOLN: 3.0000 mL | Freq: Four times a day (QID) | RESPIRATORY_TRACT | Status: DC | PRN

## 2019-10-06 MED ORDER — ALPRAZOLAM 0.5 MG PO TABS: 0.5000 mg | ORAL_TABLET | Freq: Two times a day (BID) | ORAL | Status: DC

## 2019-10-06 MED ORDER — PANTOPRAZOLE SODIUM 40 MG IV SOLR
40.0000 mg | Freq: Two times a day (BID) | INTRAVENOUS | Status: DC
  Administered 2019-10-07 – 2019-10-09 (×6): 40 mg via INTRAVENOUS
  Filled 2019-10-06 (×6): qty 40

## 2019-10-06 MED ORDER — ACETAMINOPHEN 500 MG PO TABS
1000.0000 mg | ORAL_TABLET | Freq: Once | ORAL | Status: AC
  Administered 2019-10-06: 18:00:00 1000 mg via ORAL
  Filled 2019-10-06: qty 2

## 2019-10-06 NOTE — ED Triage Notes (Signed)
Pt presents to ED c/o mid chest pain x1 hr. Pt given 324mg  ASA and x1 NT with complete resolution of chest pain. Pt states prior to NT pain was 10/10, described as pins and needles. Endorses some chest heaviness at this time.
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
## 2019-10-06 NOTE — ED Notes (Signed)
Hemoccult POSITIVE 

## 2019-10-06 NOTE — ED Notes (Signed)
Pt signed hard copy of blood transfusion consent form.

## 2019-10-06 NOTE — ED Notes (Signed)
Pt states she has been out of xanax x2 days, supposed to be delivered tomorrow.

## 2019-10-06 NOTE — ED Notes (Signed)
Meal try and ginger ale given. RN notified.

## 2019-10-06 NOTE — ED Provider Notes (Signed)
[facility] Emergency Department Provider Note  ____________________________________________  Time seen: Approximately 7:28 PM  I have reviewed the triage vital signs and the nursing notes.   HISTORY  Chief Complaint Chest Pain    HPI Traci Duran is a 63 Traci.o. female with a history of CHF CAD COPD PAD and hypertension who comes the ED complaining of chest pain that started about 30 minutes prior to arrival.  EMS arrived and gave her 324 of aspirin and 1 nitroglycerin which resolved the chest pain.  She now has a headache.  She reports that she takes nitroglycerin at home but has not taken in a while and today when she went to look she realized that she was actually out of this medicine.  Denies any recent exertional symptoms, denies shortness of breath.  Current chest pain was described as heaviness, nonradiating, no aggravating or alleviating factors, 10/10.  Currently chest pain-free.  She does have dizziness with standing and generalized weakness.     Past Medical History:  Diagnosis Date  . Anemia   . Anxiety   . CHF (congestive heart failure) ([place])   . COPD (chronic obstructive pulmonary disease) ([place])   . Coronary artery disease   . Depression   . Gastritis   . GERD (gastroesophageal reflux disease)   . Hemorrhoids   . Hyperlipidemia   . Hypertension   . PAD (peripheral artery disease) [facility])      Patient Active Problem List   Diagnosis Date Noted  . Anemia 10/06/2019  . Acute CHF (congestive heart failure) ([place]) 05/16/2019  . Rectal bleed 11/19/2018  . 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 Benzodiazepine abuse ([place]) 09/03/2018  . Moderate recurrent major depression ([place]) 09/03/2018  . Hyponatremia 10/11/2017  . Chest pain 07/28/2017  . Acute blood loss anemia 07/28/2017  . Demand ischemia ([place])   . Benign neoplasm of descending colon   . Polyp of sigmoid colon   . Age related osteoporosis 04/16/2017  . Iron  deficiency anemia secondary to blood loss (chronic)   . GI bleed 04/06/2017  . Diastolic dysfunction [id number]  . Opioid contract exists 06/27/2016  . Incidental lung nodule 01/18/2016  . 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 Hypovitaminosis D 08/29/2015  . Thyroid lesion 08/29/2015  . Major depressive disorder, single episode, unspecified 08/27/2015  . 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 Gastroesophageal reflux disease 01/26/2015  . Osteopenia 01/26/2015  . Anemia, unspecified 12/27/2014  . History of fall 12/27/2014  . Greater tuberosity of humerus fracture 10/24/2013  . AVF (arteriovenous fistula) ([place]) 11/08/2011  . Anxiety 10/24/2011  . Cerebrovascular disease 10/24/2011  . Dyslipidemia 10/24/2011  . Hypertension 10/24/2011  . Tobacco abuse 10/24/2011  . Palpitations 10/24/2011  . Renal artery stenosis ([place]) 10/24/2011  . Unstable angina ([place]) 10/14/2011  . Hyperlipidemia   . Chronic obstructive pulmonary disease ([place])   . Depression   . Peripheral arterial disease ([place])   . Coronary artery disease, non-occlusive      Past Surgical History:  Procedure Laterality Date  . APPENDECTOMY    . COLONOSCOPY WITH PROPOFOL N/A 06/09/2017   Procedure: COLONOSCOPY WITH PROPOFOL;  Surgeon: Lucilla Lame, MD;  Location: [facility] ENDOSCOPY;  Service: Endoscopy;  Laterality: N/A;  . ESOPHAGOGASTRODUODENOSCOPY N/A 07/30/2017   Procedure: ESOPHAGOGASTRODUODENOSCOPY (EGD);  Surgeon: Lin Landsman, MD;  Location: [facility] ENDOSCOPY;  Service: Gastroenterology;  Laterality: N/A;  . ESOPHAGOGASTRODUODENOSCOPY (EGD) WITH PROPOFOL N/A 04/09/2017   Procedure: ESOPHAGOGASTRODUODENOSCOPY (EGD) WITH PROPOFOL;  Surgeon:  Lucilla Lame, MD;  Location: [facility] ENDOSCOPY;  Service: Endoscopy;  Laterality: N/A;  . HIP FRACTURE SURGERY       Prior to Admission medications   Medication Sig Start Date End Date Taking? Authorizing Provider  alprazolam Duanne Moron) 2 MG tablet Take 2 mg by mouth  2 (two) times daily.    Yes [provider]  atorvastatin (LIPITOR) 80 MG tablet Take 80 mg by mouth daily.   Yes [provider]  busPIRone (BUSPAR) 10 MG tablet Take 10 mg by mouth 2 (two) times daily.    Yes [provider]  carvedilol (COREG) 6.25 MG tablet Take 6.25 mg by mouth 2 (two) times daily. 05/28/17  Yes [provider]  citalopram (CELEXA) 40 MG tablet Take 40 mg by mouth daily.   Yes [provider]  DULoxetine (CYMBALTA) 30 MG capsule Take 30 mg by mouth daily. 05/28/17  Yes [provider]  furosemide (LASIX) 40 MG tablet Take 1 tablet (40 mg total) by mouth daily. Patient taking differently: Take 80 mg by mouth daily.  05/19/19  Yes Lang Snow, NP  gabapentin (NEURONTIN) 300 MG capsule Take 300 mg by mouth 2 (two) times daily.    Yes [provider]  lisinopril (PRINIVIL,ZESTRIL) 20 MG tablet Take 20 mg by mouth daily.     Yes [provider]  nitroGLYCERIN (NITROSTAT) 0.4 MG SL tablet Place 0.4 mg under the tongue as needed. 10/06/19  Yes [provider]  pantoprazole (PROTONIX) 20 MG tablet Take 20 mg by mouth daily.   Yes [provider]  pregabalin (LYRICA) 150 MG capsule Take 150 mg by mouth 2 (two) times daily. 11/18/18 11/18/19 Yes [provider]  SYMBICORT 80-4.5 MCG/ACT inhaler Inhale 2 puffs into the lungs 2 (two) times daily. 08/12/18  Yes [provider]  acetaminophen (TYLENOL) 325 MG tablet Take 3 tablets (975 mg total) by mouth 3 (three) times daily as needed. 02/11/19   Carrie Mew, MD  albuterol (PROVENTIL) (2.5 MG/3ML) 0.083% nebulizer solution Take 3 mLs (2.5 mg total) by nebulization every 6 (six) hours as needed for wheezing or shortness of breath. 05/18/19   Mayo, Pete Pelt, MD  azithromycin (ZITHROMAX) 250 MG tablet Take 2 tablets (500 mg total) by mouth daily. Patient not taking: Reported on 10/06/2019 05/18/19   Lang Snow, NP   ipratropium-albuterol (DUONEB) 0.5-2.5 (3) MG/3ML SOLN Take 3 mLs by nebulization every 6 (six) hours as needed. 05/18/19   Lang Snow, NP  traMADol (ULTRAM) 50 MG tablet Take 1 tablet (50 mg total) by mouth every 6 (six) hours as needed. Patient not taking: Reported on 10/06/2019 05/18/19   Lang Snow, NP  Vitamin D, Ergocalciferol, (DRISDOL) 50000 units CAPS capsule Take 50,000 Units by mouth every Saturday.  05/01/17   [provider]     Allergies Chantix [varenicline], Diphenhydramine hcl, Potassium, Benadryl [diphenhydramine hcl], Niacin, and Trazodone   Family History  Problem Relation Age of Onset  . Heart failure Mother   . Heart attack Mother        mother died at 32 of an MI  . Heart attack Father        father died at 102 of an MI  . Heart attack Brother        Brother had an MI in his 45s    Social History Social History   Tobacco Use  . Smoking status: Current Some Day Smoker    Packs/day: 1.00  Years: 46.00    Pack years: 46.00    Types: Cigarettes  . Smokeless tobacco: Never Used  . Tobacco comment: she feels that the 21 nicotine made her jittery and we suggested a lower level patch  Substance Use Topics  . Alcohol use: No  . Drug use: Yes    Types: Marijuana    Comment: monthly    Review of Systems  Constitutional:   No fever or chills.  ENT:   No sore throat. No rhinorrhea. Cardiovascular: Positive chest pain without syncope. Respiratory:   No dyspnea or cough. Gastrointestinal:   Negative for abdominal pain, vomiting and diarrhea.  Musculoskeletal:   Negative for focal pain or swelling All other systems reviewed and are negative except as documented above in ROS and HPI.  ____________________________________________   PHYSICAL EXAM:  VITAL SIGNS: ED Triage Vitals  Enc Vitals Group     BP 10/06/19 1541 (!) 169/101     Pulse Rate 10/06/19 1541 75     Resp 10/06/19 1541 20     Temp 10/06/19 1541 98.8 F  (37.1 C)     Temp Source 10/06/19 1541 Oral     SpO2 10/06/19 1541 96 %     Weight 10/06/19 1542 145 lb (65.8 kg)     Height 10/06/19 1542 5\' 7"  (1.702 m)     Head Circumference --      Peak Flow --      Pain Score 10/06/19 1542 0     Pain Loc --      Pain Edu? --      Excl. in [place]? --     Vital signs reviewed, nursing assessments reviewed.   Constitutional:   Alert and oriented. Non-toxic appearance. Eyes:   Conjunctivae are pale. EOMI. PERRL. ENT      Head:   Normocephalic and atraumatic.      Nose:   Wearing a mask.      Mouth/Throat:   Wearing a mask.      Neck:   No meningismus. Full ROM. Hematological/Lymphatic/Immunilogical:   No cervical lymphadenopathy. Cardiovascular:   RRR. Symmetric bilateral radial and DP pulses.  No murmurs. Cap refill less than 2 seconds. Respiratory:   Normal respiratory effort without tachypnea/retractions. Breath sounds are clear and equal bilaterally. No wheezes/rales/rhonchi. Gastrointestinal:   Soft and nontender. Non distended. There is no CVA tenderness.  No rebound, rigidity, or guarding.  Rectal exam performed with nurse at bedside, secretions only, no melena.  Hemoccult positive  Musculoskeletal:   Normal range of motion in all extremities. No joint effusions.  No lower extremity tenderness.  No edema. Neurologic:   Normal speech and language.  Motor grossly intact. No acute focal neurologic deficits are appreciated.  Skin:    Skin is warm, dry and intact. No rash noted.  No petechiae, purpura, or bullae.  ____________________________________________    LABS (pertinent positives/negatives) (all labs ordered are listed, but only abnormal results are displayed) Labs Reviewed  BASIC METABOLIC PANEL - Abnormal; Notable for the following components:      Result Value   Potassium 2.6 (*)    Calcium 8.8 (*)    All other components within normal limits  CBC WITH DIFFERENTIAL/PLATELET - Abnormal; Notable for the following components:    WBC 11.7 (*)    RBC 3.61 (*)    Hemoglobin 7.6 (*)    HCT 26.0 (*)    MCV 72.0 (*)    MCH 21.1 (*)    MCHC 29.2 (*)  RDW 16.9 (*)    All other components within normal limits  SARS CORONAVIRUS 2 (TAT 6-24 HRS)  FERRITIN  IRON AND TIBC  VITAMIN B12  MAGNESIUM  BASIC METABOLIC PANEL  CBC  TYPE AND SCREEN  PREPARE RBC (CROSSMATCH)  TROPONIN I (HIGH SENSITIVITY)  TROPONIN I (HIGH SENSITIVITY)   ____________________________________________   EKG  Interpreted by me Sinus rhythm rate of 74, normal axis intervals QRS ST segments and T waves  ____________________________________________    RADIOLOGY  Dg Chest Portable 1 View  Result Date: 10/06/2019 CLINICAL DATA:  Chest pain EXAM: PORTABLE CHEST 1 VIEW COMPARISON:  May 16, 2019 FINDINGS: There is mild cardiomegaly. Mild central pulmonary vascular congestion. No large airspace consolidation or pleural effusion. No acute osseous abnormality. IMPRESSION: Pulmonary vascular congestion. Electronically Signed   By: Prudencio Pair M.D.   On: 10/06/2019 16:04    ____________________________________________   PROCEDURES .Critical Care Performed by: Carrie Mew, MD Authorized by: Carrie Mew, MD   Critical care provider statement:    Critical care time (minutes):  35   Critical care time was exclusive of:  Separately billable procedures and treating other patients   Critical care was necessary to treat or prevent imminent or life-threatening deterioration of the following conditions:  Circulatory failure   Critical care was time spent personally by me on the following activities:  Development of treatment plan with patient or surrogate, discussions with consultants, evaluation of patient's response to treatment, examination of patient, obtaining history from patient or surrogate, ordering and performing treatments and interventions, ordering and review of laboratory studies, ordering and review of radiographic studies,  pulse oximetry, re-evaluation of patient's condition and review of old charts    ____________________________________________  DIFFERENTIAL DIAGNOSIS   Non-STEMI, symptomatic anemia, GI bleed  CLINICAL IMPRESSION / ASSESSMENT AND PLAN / ED COURSE  Medications ordered in the ED: Medications  0.9 %  sodium chloride infusion (has no administration in time range)  potassium chloride SA (KLOR-CON) CR tablet 40 mEq (has no administration in time range)  pantoprazole (PROTONIX) injection 40 mg (has no administration in time range)  acetaminophen (TYLENOL) tablet 650 mg (has no administration in time range)    Or  acetaminophen (TYLENOL) suppository 650 mg (has no administration in time range)  traMADol (ULTRAM) tablet 50 mg (has no administration in time range)  ondansetron (ZOFRAN) tablet 4 mg (has no administration in time range)    Or  ondansetron (ZOFRAN) injection 4 mg (has no administration in time range)  amoxicillin-clavulanate (AUGMENTIN) 875-125 MG per tablet 1 tablet (has no administration in time range)  atorvastatin (LIPITOR) tablet 80 mg (has no administration in time range)  carvedilol (COREG) tablet 6.25 mg (has no administration in time range)  furosemide (LASIX) tablet 40 mg (has no administration in time range)  lisinopril (ZESTRIL) tablet 20 mg (has no administration in time range)  ALPRAZolam (XANAX) tablet 0.5 mg (has no administration in time range)  busPIRone (BUSPAR) tablet 10 mg (has no administration in time range)  citalopram (CELEXA) tablet 40 mg (has no administration in time range)  DULoxetine (CYMBALTA) DR capsule 30 mg (has no administration in time range)  gabapentin (NEURONTIN) capsule 300 mg (has no administration in time range)  ipratropium-albuterol (DUONEB) 0.5-2.5 (3) MG/3ML nebulizer solution 3 mL (has no administration in time range)  mometasone-formoterol (DULERA) 100-5 MCG/ACT inhaler 2 puff (has no administration in time range)  nicotine  (NICODERM CQ - dosed in mg/24 hr) patch 7 mg (has no administration in  time range)  furosemide (LASIX) injection 40 mg (has no administration in time range)  ALPRAZolam (XANAX) tablet 2 mg (2 mg Oral Given 10/06/19 1740)  acetaminophen (TYLENOL) tablet 1,000 mg (1,000 mg Oral Given 10/06/19 1740)  potassium chloride SA (KLOR-CON) CR tablet 40 mEq (40 mEq Oral Given 10/06/19 1740)    Pertinent labs & imaging results that were available during my care of the patient were reviewed by me and considered in my medical decision making (see chart for details).  YESINIA Duran was evaluated in Emergency Department on 10/06/2019 for the symptoms described in the history of present illness. She was evaluated in the context of the global COVID-19 pandemic, which necessitated consideration that the patient might be at risk for infection with the SARS-CoV-2 virus that causes COVID-19. Institutional protocols and algorithms that pertain to the evaluation of patients at risk for COVID-19 are in a state of rapid change based on information released by regulatory bodies including the CDC and federal and state organizations. These policies and algorithms were followed during the patient's care in the ED.   Patient presents with episode of chest pain consistent with her angina.  No accelerating exertional symptoms, doubt unstable angina, likely stable angina pectoris versus non-STEMI.  Also clinically has a GI bleed.  We will check her labs and reassess.  ----------------------------------------- 7:39 PM on 10/06/2019 -----------------------------------------  Hemoglobin is dropped from a baseline of 10-7.  With her chest pain and orthostatic symptoms, discussed with patient who agrees to transfusion.  Discussed with hospitalist for further management.      ____________________________________________   FINAL CLINICAL IMPRESSION(S) / ED DIAGNOSES    Final diagnoses:  Symptomatic anemia  Occult GI  bleeding  Nonspecific chest pain     ED Discharge Orders    None      Portions of this note were generated with dragon dictation software. Dictation errors may occur despite best attempts at proofreading.   Carrie Mew, MD 10/06/19 [phone]

## 2019-10-06 NOTE — H&P (Addendum)
[facility] [place] [facility]   PATIENT NAME: Traci Duran    MR#:  [id number]  DATE OF BIRTH:  09-Jun-1956  DATE OF ADMISSION:  10/06/2019  PRIMARY CARE PHYSICIAN: [name]   REQUESTING/REFERRING PHYSICIAN: Dr Sharman Cheek  CHIEF COMPLAINT:   Chief Complaint  Patient presents with  . Chest Pain    HISTORY OF PRESENT ILLNESS:  Traci Duran  is a 63 y.o. female coming in with chest pain at last only half second at the time.  Currently not having any chest pain.  It felt like pins-and-needles in her chest.  The left side of her chest is sore.  She has a tooth ache that is really bothering her.  Complains of pains in her feet.  Complains of feeling fatigued.  No shortness of breath.  Some sweats.  She ran out of her medication 2 days ago.  In the ER she is found to be anemic and hospitalist services were contacted for further evaluation.  PAST MEDICAL HISTORY:   Past Medical History:  Diagnosis Date  . Anemia   . Anxiety   . CHF (congestive heart failure) (HCC)   . COPD (chronic obstructive pulmonary disease) (HCC)   . Coronary artery disease   . Depression   . Gastritis   . GERD (gastroesophageal reflux disease)   . Hemorrhoids   . Hyperlipidemia   . Hypertension   . PAD (peripheral artery disease) (HCC)     PAST SURGICAL HISTORY:   Past Surgical History:  Procedure Laterality Date  . APPENDECTOMY    . COLONOSCOPY WITH PROPOFOL N/A 06/09/2017   Procedure: COLONOSCOPY WITH PROPOFOL;  Surgeon: Midge Minium, MD;  Location: [facility] ENDOSCOPY;  Service: Endoscopy;  Laterality: N/A;  . ESOPHAGOGASTRODUODENOSCOPY N/A 07/30/2017   Procedure: ESOPHAGOGASTRODUODENOSCOPY (EGD);  Surgeon: Toney Reil, MD;  Location: [facility] ENDOSCOPY;  Service: Gastroenterology;  Laterality: N/A;  . ESOPHAGOGASTRODUODENOSCOPY (EGD) WITH PROPOFOL N/A 04/09/2017   Procedure: ESOPHAGOGASTRODUODENOSCOPY (EGD) WITH PROPOFOL;  Surgeon: Midge Minium,  MD;  Location: [facility] ENDOSCOPY;  Service: Endoscopy;  Laterality: N/A;  . HIP FRACTURE SURGERY      SOCIAL HISTORY:   Social History   Tobacco Use  . Smoking status: Current Some Day Smoker    Packs/day: 1.00    Years: 46.00    Pack years: 46.00    Types: Cigarettes  . Smokeless tobacco: Never Used  . Tobacco comment: she feels that the 21 nicotine made her jittery and we suggested a lower level patch  Substance Use Topics  . Alcohol use: No    FAMILY HISTORY:   Family History  Problem Relation Age of Onset  . Heart failure Mother   . Heart attack Mother        mother died at 76 of an MI  . Heart attack Father        father died at 52 of an MI  . Heart attack Brother        Brother had an MI in his 34s    DRUG ALLERGIES:   Allergies  Allergen Reactions  . Chantix [Varenicline] Hives, Shortness Of Breath and Palpitations  . Diphenhydramine Hcl Shortness Of Breath  . Potassium Itching and Swelling  . Benadryl [Diphenhydramine Hcl] Rash  . Niacin Rash  . Trazodone Palpitations    REVIEW OF SYSTEMS:  CONSTITUTIONAL: No fever.positive for sweats.  Positive for fatigue.  Positive weight gain. EYES: Some blurred vision with her eyes crossing about a couple weeks ago  EARS, NOSE, AND THROAT: Positive runny nose.  Positive for sore throat.  Occasional dysphagia.  Positive for toothache. RESPIRATORY: No cough, shortness of breath, wheezing or hemoptysis.  CARDIOVASCULAR: Positive for chest pain.  No orthopnea, edema.  GASTROINTESTINAL: No nausea, vomiting.some diarrhea.  No abdominal pain. No blood in bowel movements as per patient GENITOURINARY: No dysuria, hematuria.  ENDOCRINE: No polyuria, nocturia,  HEMATOLOGY: No anemia, easy bruising or bleeding SKIN: No rash or lesion. MUSCULOSKELETAL: Positive for joint pain NEUROLOGIC: No tingling, numbness, weakness.  PSYCHIATRY: Positive for anxiety and depression.   MEDICATIONS AT HOME:   Prior to Admission medications    Medication Sig Start Date End Date Taking? Authorizing Provider  acetaminophen (TYLENOL) 325 MG tablet Take 3 tablets (975 mg total) by mouth 3 (three) times daily as needed. 02/11/19   Sharman Cheek, MD  albuterol (PROVENTIL) (2.5 MG/3ML) 0.083% nebulizer solution Take 3 mLs (2.5 mg total) by nebulization every 6 (six) hours as needed for wheezing or shortness of breath. 05/18/19   Mayo, Allyn Kenner, MD  alprazolam Prudy Feeler) 2 MG tablet Take 2 mg by mouth 2 (two) times daily.     [provider]  atorvastatin (LIPITOR) 80 MG tablet Take 80 mg by mouth daily.    [provider]  azithromycin (ZITHROMAX) 250 MG tablet Take 2 tablets (500 mg total) by mouth daily. 05/18/19   Jimmye Norman, NP  busPIRone (BUSPAR) 10 MG tablet Take 10 mg by mouth 2 (two) times daily.     [provider]  carvedilol (COREG) 6.25 MG tablet Take 6.25 mg by mouth 2 (two) times daily. 05/28/17   [provider]  citalopram (CELEXA) 40 MG tablet Take 40 mg by mouth daily.    [provider]  DULoxetine (CYMBALTA) 30 MG capsule Take 30 mg by mouth daily. 05/28/17   [provider]  furosemide (LASIX) 40 MG tablet Take 1 tablet (40 mg total) by mouth daily. Patient taking differently: Take 80 mg by mouth daily.  05/19/19   Jimmye Norman, NP  gabapentin (NEURONTIN) 300 MG capsule Take 300 mg by mouth 2 (two) times daily.     [provider]  ipratropium-albuterol (DUONEB) 0.5-2.5 (3) MG/3ML SOLN Take 3 mLs by nebulization every 6 (six) hours as needed. 05/18/19   Jimmye Norman, NP  lisinopril (PRINIVIL,ZESTRIL) 20 MG tablet Take 20 mg by mouth daily.      [provider]  pantoprazole (PROTONIX) 20 MG tablet Take 20 mg by mouth daily.    [provider]  pregabalin (LYRICA) 150 MG capsule Take 150 mg by mouth 2 (two) times daily. 11/18/18 11/18/19  [provider]  SYMBICORT 80-4.5 MCG/ACT inhaler Inhale 2 puffs into  the lungs 2 (two) times daily. 08/12/18   [provider]  traMADol (ULTRAM) 50 MG tablet Take 1 tablet (50 mg total) by mouth every 6 (six) hours as needed. 05/18/19   Jimmye Norman, NP  Vitamin D, Ergocalciferol, (DRISDOL) 50000 units CAPS capsule Take 50,000 Units by mouth every Saturday.  05/01/17   [provider]   Medication reconciliation still undergoing  VITAL SIGNS:  Blood pressure (!) 152/76, pulse 74, temperature 98.8 F (37.1 C), temperature source Oral, resp. rate 18, height 5\' 7"  (1.702 m), weight 65.8 kg, SpO2 96 %.  PHYSICAL EXAMINATION:  GENERAL:  63 y.o.-year-old patient lying in the bed with no acute distress.  EYES: Pupils equal, round, reactive to light and accommodation. No scleral icterus. Extraocular muscles  intact.  HEENT: Head atraumatic, normocephalic. Oropharynx and nasopharynx clear.  NECK:  Supple, no jugular venous distention. No thyroid enlargement, no tenderness.  LUNGS: Normal breath sounds bilaterally, no wheezing, rales,rhonchi or crepitation. No use of accessory muscles of respiration.  CARDIOVASCULAR: S1, S2 normal. No murmurs, rubs, or gallops.  ABDOMEN: Soft, nontender, nondistended. Bowel sounds present. No organomegaly or mass.  EXTREMITIES: No pedal edema, cyanosis, or clubbing.  NEUROLOGIC: Cranial nerves II through XII are intact. Muscle strength 5/5 in all extremities. Sensation intact. Gait not checked.  PSYCHIATRIC: The patient is alert and oriented x 3.  SKIN: No rash, lesion, or ulcer.   Guaiac positive as per ER physician  LABORATORY PANEL:   CBC Recent Labs  Lab 10/06/19 1554  WBC 11.7*  HGB 7.6*  HCT 26.0*  PLT 306   ------------------------------------------------------------------------------------------------------------------  Chemistries  Recent Labs  Lab 10/06/19 1554  NA 140  K 2.6*  CL 99  CO2 29  GLUCOSE 96  BUN 11  CREATININE 0.89  CALCIUM 8.8*    ------------------------------------------------------------------------------------------------------------------  Cardiac Enzymes 2 high-sensitivity troponins negative.  No further work-up  RADIOLOGY:  Dg Chest Portable 1 View  Result Date: 10/06/2019 CLINICAL DATA:  Chest pain EXAM: PORTABLE CHEST 1 VIEW COMPARISON:  May 16, 2019 FINDINGS: There is mild cardiomegaly. Mild central pulmonary vascular congestion. No large airspace consolidation or pleural effusion. No acute osseous abnormality. IMPRESSION: Pulmonary vascular congestion. Electronically Signed   By: Jonna Clark M.D.   On: 10/06/2019 16:04    EKG:   Interpreted by me.  Normal sinus rhythm 74 bpm, QTC 496.  IMPRESSION AND PLAN:   1.  Acute blood loss anemia with symptomatic anemia and guaiac positive.  ER physician ordered for a blood transfusion over 2 hours.  I will give Lasix afterwards.  Add on iron studies.  Looks like she has had a colonoscopy 2 years ago.  Endoscopy also.  Will get GI consultation for possible endoscopy. 2.  Hypokalemia replace potassium orally and check magnesium. 3.  Hypertension continue current medications 4.  Coronary artery disease on Coreg.  No blood thinners with symptomatic anemia. Since 2 high sensitivity troponin's are negative, this is not cardiac, no further work up. 5.  Chronic diastolic congestive heart failure.  No signs of heart failure at this time.  Restart cardiac medications.  Lasix with blood. 6.  Tobacco abuse.  Smoking cessation counseling done 4 minutes by me.  Nicotine patch ordered 7.  Anxiety depression continue psychiatric medications 8.  Neuropathy on gabapentin. 9.  Tooth infection left lower molar in the back.  Start Augmentin   All radiological and laboratory data are reviewed and case discussed with ED provider.  Case discussed with Dr. Tobi Bastos on-call for gastroenterology this evening Management plans discussed with the patient, and she is in agreement.  Patient  deferred me from calling any family  CODE STATUS: full code  TOTAL TIME TAKING CARE OF THIS PATIENT: 50 minutes.    Alford Highland M.D on 10/06/2019 at 7:13 PM  Between 7am to 6pm - Pager - [phone]  After 6pm call admission pager [phone]  [facility]  [phone]  CC: Primary care physician; [name]

## 2019-10-07 ENCOUNTER — Other Ambulatory Visit: Payer: Self-pay

## 2019-10-07 DIAGNOSIS — D509 Iron deficiency anemia, unspecified: Secondary | ICD-10-CM

## 2019-10-07 DIAGNOSIS — D649 Anemia, unspecified: Secondary | ICD-10-CM

## 2019-10-07 LAB — BASIC METABOLIC PANEL
Anion gap: 12 (ref 5–15)
BUN: 11 mg/dL (ref 8–23)
CO2: 27 mmol/L (ref 22–32)
Calcium: 8.7 mg/dL — ABNORMAL LOW (ref 8.9–10.3)
Chloride: 105 mmol/L (ref 98–111)
Creatinine, Ser: 0.74 mg/dL (ref 0.44–1.00)
GFR calc Af Amer: >60 mL/min (ref >60)
GFR calc non Af Amer: >60 mL/min (ref >60)
Glucose, Bld: 89 mg/dL (ref 70–99)
Potassium: 3.3 mmol/L — ABNORMAL LOW (ref 3.5–5.1)
Sodium: 144 mmol/L (ref 135–145)

## 2019-10-07 LAB — CBC
HCT: 29.1 % — ABNORMAL LOW (ref 36.0–46.0)
Hemoglobin: 8.6 g/dL — ABNORMAL LOW (ref 12.0–15.0)
MCH: 21.8 pg — ABNORMAL LOW (ref 26.0–34.0)
MCHC: 29.6 g/dL — ABNORMAL LOW (ref 30.0–36.0)
MCV: 73.7 fL — ABNORMAL LOW (ref 80.0–100.0)
Platelets: 252 10*3/uL (ref 150–400)
RBC: 3.95 MIL/uL (ref 3.87–5.11)
RDW: 16.9 % — ABNORMAL HIGH (ref 11.5–15.5)
WBC: 7.5 10*3/uL (ref 4.0–10.5)
nRBC: 0 % (ref 0.0–0.2)

## 2019-10-07 LAB — TYPE AND SCREEN
ABO/RH(D): O POS
Antibody Screen: NEGATIVE
Unit division: 0

## 2019-10-07 LAB — BPAM RBC
Blood Product Expiration Date: 202011032359
ISSUE DATE / TIME: 202010292108
Unit Type and Rh: 5100

## 2019-10-07 LAB — VITAMIN B12: Vitamin B-12: 251 pg/mL (ref 180–914)

## 2019-10-07 LAB — SARS CORONAVIRUS 2 (TAT 6-24 HRS): SARS Coronavirus 2: NEGATIVE

## 2019-10-07 MED ORDER — SODIUM CHLORIDE 0.9 % IV SOLN
300.0000 mg | Freq: Once | INTRAVENOUS | Status: AC
  Administered 2019-10-08: 16:00:00 300 mg via INTRAVENOUS
  Filled 2019-10-07: qty 15

## 2019-10-07 MED ORDER — PEG 3350-KCL-NA BICARB-NACL 420 G PO SOLR
4000.0000 mL | Freq: Once | ORAL | Status: AC
  Administered 2019-10-07: 21:00:00 4000 mL via ORAL
  Filled 2019-10-07: qty 4000

## 2019-10-07 MED ORDER — VITAMIN B-12 1000 MCG PO TABS
1000.0000 ug | ORAL_TABLET | Freq: Every day | ORAL | Status: DC
  Administered 2019-10-07 – 2019-10-09 (×2): 1000 ug via ORAL
  Filled 2019-10-07 (×3): qty 1

## 2019-10-07 MED ORDER — MAGNESIUM CITRATE PO SOLN
1.0000 | Freq: Once | ORAL | Status: AC
  Administered 2019-10-07: 21:00:00 1 via ORAL
  Filled 2019-10-07: qty 296

## 2019-10-07 MED ORDER — PNEUMOCOCCAL VAC POLYVALENT 25 MCG/0.5ML IJ INJ
0.5000 mL | INJECTION | INTRAMUSCULAR | Status: AC
  Administered 2019-10-09: 07:00:00 0.5 mL via INTRAMUSCULAR
  Filled 2019-10-07: qty 0.5

## 2019-10-07 MED ORDER — ALPRAZOLAM 1 MG PO TABS
2.0000 mg | ORAL_TABLET | Freq: Two times a day (BID) | ORAL | Status: DC
  Administered 2019-10-07 – 2019-10-09 (×6): 2 mg via ORAL
  Filled 2019-10-07 (×3): qty 2
  Filled 2019-10-07: qty 4
  Filled 2019-10-07 (×2): qty 2

## 2019-10-07 MED ORDER — NITROGLYCERIN 0.4 MG SL SUBL: 0.4000 mg | SUBLINGUAL_TABLET | SUBLINGUAL | Status: DC | PRN

## 2019-10-07 MED ORDER — SODIUM CHLORIDE 0.9 % IV SOLN
INTRAVENOUS | Status: DC | PRN
  Administered 2019-10-07: 250 mL via INTRAVENOUS

## 2019-10-07 MED ORDER — SODIUM CHLORIDE 0.9 % IV SOLN
510.0000 mg | Freq: Once | INTRAVENOUS | Status: AC
  Administered 2019-10-07: 510 mg via INTRAVENOUS
  Filled 2019-10-07: qty 17

## 2019-10-07 MED ORDER — VITAMIN D (ERGOCALCIFEROL) 1.25 MG (50000 UNIT) PO CAPS
50000.0000 [IU] | ORAL_CAPSULE | ORAL | Status: DC
  Filled 2019-10-07: qty 1

## 2019-10-07 NOTE — ED Notes (Signed)
Patient does not want to be changed at this time, would like for this RN to wait until 0615

## 2019-10-07 NOTE — Consult Note (Signed)
[name], MD [address]  [place]  [place], [place] [address]  Main: [phone]  Fax: [phone] Pager: [phone]   Consultation  Referring Provider:     No ref. provider found Primary Care Physician:  [facility] Primary Gastroenterologist:  Dr. Allen Norris         Reason for Consultation:     Symptomatic anemia  Date of Admission:  10/06/2019 Date of Consultation:  10/07/2019         HPI:   Traci Duran is a 63 y.o. female with known history of chronic iron deficiency anemia who underwent 2 EGDs and colonoscopy in the past with no clear etiology identified.  Patient presented to [facility] in 11/2018 secondary to severe symptomatic anemia when she was on DAPT.  Plan was made to hold Plavix for 5 days and perform patient anoscopy, colonoscopy and may be a video capsule endoscopy as outpatient.  However, she did not follow-up for procedures.  She presented to ER yesterday with chest pain and was found to have hemoglobin 7.6, MCV 72.  Normal BUN/creatinine ratio.  Patient received 1 unit of PRBCs and responded appropriately.  Cardiac etiology was ruled out for her chest pain.  She did not have active GI bleed including symptoms of epigastric pain, nausea, vomiting, melena, hematemesis, rectal bleeding or hematochezia.  She denies blood in urine or vaginal bleeding.  GI is consulted for evaluation of acute on chronic worsening iron deficiency anemia She said DAPT was stopped due to anemia, few months ago Her hemoglobin was 10.3 in 05/2019.  Ferritin of 5 There was no evidence of H. pylori infection or celiac disease She smokes weed and tobacco NSAIDs: BC powder or goody powder once a month  Antiplts/Anticoagulants/Anti thrombotics: None  GI Procedures:   EGD 07/30/2017 - Normal duodenal bulb, second portion of the duodenum and third portion of the duodenum. Biopsied. - Non-bleeding erosive gastropathy. - Erythematous mucosa in the gastric body.  Biopsied. - Z-line irregular. - Small hiatal hernia. - Normal esophagus. DIAGNOSIS:  A. DUODENUM; COLD BIOPSY:  - MILD CHRONIC NONSPECIFIC DUODENITIS.  - FEATURES OF CELIAC DISEASE ARE NOT IDENTIFIED.  - NEGATIVE FOR DYSPLASIA AND MALIGNANCY.   B. STOMACH; COLD BIOPSY:  - FOCAL EVIDENCE OF HEALING MUCOSAL INJURY.  - NEGATIVE FOR H. PYLORI, DYSPLASIA, AND MALIGNANCY.   Colonoscopy 06/09/2017 - One 8 mm polyp in the descending colon, removed with a hot snare. Resected and retrieved. - Four 3 to 6 mm polyps in the sigmoid colon, removed with a cold snare. Resected and retrieved. - Diverticulosis in the sigmoid colon. DIAGNOSIS:  A. COLON POLYP, DESCENDING; HOT SNARE:  - TUBULAR ADENOMA, MULTIPLE FRAGMENTS.  - NEGATIVE FOR HIGH-GRADE DYSPLASIA AND MALIGNANCY.   B. COLON POLYP X 3, SIGMOID; COLD SNARE:  - HYPERPLASTIC POLYPS, 4 FRAGMENTS.  - NEGATIVE FOR DYSPLASIA AND MALIGNANCY.   EGD 04/09/2017 - Normal esophagus. - Gastritis. - A single duodenal polyp. Biopsied.  DIAGNOSIS:  A. DUODENAL BULB, ABNORMAL MUCOSA; COLD BIOPSY:  - DUODENAL MUCOSA WITH PROMINENT BRUNNER'S GLANDS AND REGENERATIVE  OVERLYING EPITHELIAL CHANGES.  - NEGATIVE FOR DYSPLASIA AND MALIGNANCY.   Past Medical History:  Diagnosis Date  . Anemia   . Anxiety   . CHF (congestive heart failure) ([facility])   . COPD (chronic obstructive pulmonary disease) ([facility])   . Coronary artery disease   . Depression   . Gastritis   . GERD (gastroesophageal reflux disease)   . Hemorrhoids   .  Hyperlipidemia   . Hypertension   . PAD (peripheral artery disease) ([facility])     Past Surgical History:  Procedure Laterality Date  . APPENDECTOMY    . COLONOSCOPY WITH PROPOFOL N/A 06/09/2017   Procedure: COLONOSCOPY WITH PROPOFOL;  Surgeon: Wohl, Darren, MD;  Location: [facility] ENDOSCOPY;  Service: Endoscopy;  Laterality: N/A;  . ESOPHAGOGASTRODUODENOSCOPY N/A 07/30/2017   Procedure: ESOPHAGOGASTRODUODENOSCOPY (EGD);  Surgeon: ,   Reddy, MD;  Location: [facility] ENDOSCOPY;  Service: Gastroenterology;  Laterality: N/A;  . ESOPHAGOGASTRODUODENOSCOPY (EGD) WITH PROPOFOL N/A 04/09/2017   Procedure: ESOPHAGOGASTRODUODENOSCOPY (EGD) WITH PROPOFOL;  Surgeon: Darren Wohl, MD;  Location: [facility] ENDOSCOPY;  Service: Endoscopy;  Laterality: N/A;  . HIP FRACTURE SURGERY      Prior to Admission medications   Medication Sig Start Date End Date Taking? Authorizing Provider  alprazolam (XANAX) 2 MG tablet Take 2 mg by mouth 2 (two) times daily.    Yes [provider]  atorvastatin (LIPITOR) 80 MG tablet Take 80 mg by mouth daily.   Yes [provider]  busPIRone (BUSPAR) 10 MG tablet Take 10 mg by mouth 2 (two) times daily.    Yes [provider]  carvedilol (COREG) 6.25 MG tablet Take 6.25 mg by mouth 2 (two) times daily. 05/28/17  Yes [provider]  citalopram (CELEXA) 40 MG tablet Take 40 mg by mouth daily.   Yes [provider]  DULoxetine (CYMBALTA) 30 MG capsule Take 30 mg by mouth daily. 05/28/17  Yes [provider]  furosemide (LASIX) 40 MG tablet Take 1 tablet (40 mg total) by mouth daily. Patient taking differently: Take 80 mg by mouth daily.  05/19/19  Yes Ouma, Elizabeth Achieng, NP  gabapentin (NEURONTIN) 300 MG capsule Take 300 mg by mouth 2 (two) times daily.    Yes [provider]  lisinopril (PRINIVIL,ZESTRIL) 20 MG tablet Take 20 mg by mouth daily.     Yes [provider]  nitroGLYCERIN (NITROSTAT) 0.4 MG SL tablet Place 0.4 mg under the tongue as needed. 10/06/19  Yes [provider]  pantoprazole (PROTONIX) 20 MG tablet Take 20 mg by mouth daily.   Yes [provider]  pregabalin (LYRICA) 150 MG capsule Take 150 mg by mouth 2 (two) times daily. 11/18/18 11/18/19 Yes [provider]  SYMBICORT 80-4.5 MCG/ACT inhaler Inhale 2 puffs into the lungs 2 (two) times daily. 08/12/18  Yes [provider]  acetaminophen (TYLENOL)  325 MG tablet Take 3 tablets (975 mg total) by mouth 3 (three) times daily as needed. 02/11/19   Stafford, Phillip, MD  albuterol (PROVENTIL) (2.5 MG/3ML) 0.083% nebulizer solution Take 3 mLs (2.5 mg total) by nebulization every 6 (six) hours as needed for wheezing or shortness of breath. 05/18/19   Mayo, Katy Dodd, MD  azithromycin (ZITHROMAX) 250 MG tablet Take 2 tablets (500 mg total) by mouth daily. Patient not taking: Reported on 10/06/2019 05/18/19   Ouma, Elizabeth Achieng, NP  ipratropium-albuterol (DUONEB) 0.5-2.5 (3) MG/3ML SOLN Take 3 mLs by nebulization every 6 (six) hours as needed. 05/18/19   Ouma, Elizabeth Achieng, NP  traMADol (ULTRAM) 50 MG tablet Take 1 tablet (50 mg total) by mouth every 6 (six) hours as needed. Patient not taking: Reported on 10/06/2019 05/18/19   Ouma, Elizabeth Achieng, NP  Vitamin D, Ergocalciferol, (DRISDOL) 50000 units CAPS capsule Take 50,000 Units by mouth every Saturday.  05/01/17   [provider]    Current Facility-Administered Medications:  .  acetaminophen (TYLENOL) tablet 650 mg, 650 mg,   Oral, Q6H PRN **OR** acetaminophen (TYLENOL) suppository 650 mg, 650 mg, Rectal, Q6H PRN, Wieting, Richard, MD .  ALPRAZolam (XANAX) tablet 2 mg, 2 mg, Oral, BID, Willis, David, MD, 2 mg at 10/07/19 1011 .  amoxicillin-clavulanate (AUGMENTIN) 875-125 MG per tablet 1 tablet, 1 tablet, Oral, Q12H, Wieting, Richard, MD, 1 tablet at 10/07/19 1012 .  atorvastatin (LIPITOR) tablet 80 mg, 80 mg, Oral, Daily, Wieting, Richard, MD, 80 mg at 10/07/19 1012 .  busPIRone (BUSPAR) tablet 10 mg, 10 mg, Oral, BID, Wieting, Richard, MD, 10 mg at 10/07/19 1012 .  carvedilol (COREG) tablet 6.25 mg, 6.25 mg, Oral, BID, Wieting, Richard, MD, 6.25 mg at 10/07/19 1012 .  citalopram (CELEXA) tablet 40 mg, 40 mg, Oral, Daily, Wieting, Richard, MD, 40 mg at 10/07/19 1012 .  DULoxetine (CYMBALTA) DR capsule 30 mg, 30 mg, Oral, Daily, Wieting, Richard, MD, 30 mg at 10/07/19 1012 .   ferumoxytol (FERAHEME) 510 mg in sodium chloride 0.9 % 100 mL IVPB, 510 mg, Intravenous, Once, Yoltzin Ransom Reddy, MD .  furosemide (LASIX) tablet 40 mg, 40 mg, Oral, Daily, Wieting, Richard, MD, 40 mg at 10/07/19 1012 .  gabapentin (NEURONTIN) capsule 300 mg, 300 mg, Oral, BID, Wieting, Richard, MD, 300 mg at 10/07/19 1012 .  ipratropium-albuterol (DUONEB) 0.5-2.5 (3) MG/3ML nebulizer solution 3 mL, 3 mL, Nebulization, Q6H PRN, Wieting, Richard, MD .  [START ON 10/08/2019] iron sucrose (VENOFER) 300 mg in sodium chloride 0.9 % 250 mL IVPB, 300 mg, Intravenous, Once, Mylinh Cragg Reddy, MD .  lisinopril (ZESTRIL) tablet 20 mg, 20 mg, Oral, Daily, Wieting, Richard, MD, 20 mg at 10/07/19 1012 .  magnesium citrate solution 1 Bottle, 1 Bottle, Oral, Once, Miette Molenda Reddy, MD .  mometasone-formoterol (DULERA) 100-5 MCG/ACT inhaler 2 puff, 2 puff, Inhalation, BID, Wieting, Richard, MD, 2 puff at 10/07/19 1020 .  nicotine (NICODERM CQ - dosed in mg/24 hr) patch 7 mg, 7 mg, Transdermal, Daily, Wieting, Richard, MD, 7 mg at 10/07/19 1017 .  nitroGLYCERIN (NITROSTAT) SL tablet 0.4 mg, 0.4 mg, Sublingual, Q5 min PRN, Shah, Vipul, MD .  ondansetron (ZOFRAN) tablet 4 mg, 4 mg, Oral, Q6H PRN **OR** ondansetron (ZOFRAN) injection 4 mg, 4 mg, Intravenous, Q6H PRN, Wieting, Richard, MD .  pantoprazole (PROTONIX) injection 40 mg, 40 mg, Intravenous, Q12H, Wieting, Richard, MD, 40 mg at 10/07/19 1015 .  [START ON 10/08/2019] pneumococcal 23 valent vaccine (PNU-IMMUNE) injection 0.5 mL, 0.5 mL, Intramuscular, Tomorrow-1000, Shah, Vipul, MD .  polyethylene glycol-electrolytes (NuLYTELY/GoLYTELY) solution 4,000 mL, 4,000 mL, Oral, Once, Lucilia Yanni Reddy, MD .  traMADol (ULTRAM) tablet 50 mg, 50 mg, Oral, Q6H PRN, Wieting, Richard, MD, 50 mg at 10/07/19 1133 .  vitamin B-12 (CYANOCOBALAMIN) tablet 1,000 mcg, 1,000 mcg, Oral, Daily, Kin Galbraith Reddy, MD .  [START ON 10/08/2019] Vitamin D (Ergocalciferol)  (DRISDOL) capsule 50,000 Units, 50,000 Units, Oral, Q7 days, Shah, Vipul, MD  Family History  Problem Relation Age of Onset  . Heart failure Mother   . Heart attack Mother        mother died at 64 of an MI  . Heart attack Father        father died at 61 of an MI  . Heart attack Brother        Brother had an MI in his 50s     Social History   Tobacco Use  . Smoking status: Current Some Day Smoker    Packs/day: 1.00    Years: 46.00    Pack years:   46.00    Types: Cigarettes  . Smokeless tobacco: Never Used  . Tobacco comment: she feels that the 21 nicotine made her jittery and we suggested a lower level patch  Substance Use Topics  . Alcohol use: No  . Drug use: Yes    Types: Marijuana    Comment: monthly    Allergies as of 10/06/2019 - Review Complete 10/06/2019  Allergen Reaction Noted  . Chantix [varenicline] Hives, Shortness Of Breath, and Palpitations 09/06/2012  . Diphenhydramine hcl Shortness Of Breath   . Potassium Itching and Swelling 11/19/2017  . Benadryl [diphenhydramine hcl] Rash 10/14/2011  . Niacin Rash   . Trazodone Palpitations 05/23/2015    Review of Systems:    All systems reviewed and negative except where noted in HPI.   Physical Exam:  Vital signs in last 24 hours: Temp:  [97.7 F (36.5 C)-98.6 F (37 C)] 98.6 F (37 C) (10/30 1407) Pulse Rate:  [67-84] 67 (10/30 1407) Resp:  [11-26] 14 (10/30 1407) BP: (102-176)/(57-102) 102/58 (10/30 1407) SpO2:  [92 %-100 %] 96 % (10/30 1407) Last BM Date: 10/06/19 General:   Pleasant, cooperative in NAD Head:  Normocephalic and atraumatic. Eyes:   No icterus.   Conjunctiva pale. PERRLA. Ears:  Normal auditory acuity. Neck:  Supple; no masses or thyroidomegaly Lungs: Respirations even and unlabored. Lungs clear to auscultation bilaterally.   No wheezes, crackles, or rhonchi.  Heart:  Regular rate and rhythm;  Without murmur, clicks, rubs or gallops Abdomen:  Soft, nondistended, nontender. Normal bowel  sounds. No appreciable masses or hepatomegaly.  No rebound or guarding.  Rectal:  Not performed. Msk:  Symmetrical without gross deformities.  Strength generalized weakness  Extremities:  Without edema, cyanosis or clubbing. Neurologic:  Alert and oriented x3;  grossly normal neurologically. Skin:  Intact without significant lesions or rashes. Psych:  Alert and cooperative. Normal affect.  LAB RESULTS: CBC Latest Ref Rng & Units 10/07/2019 10/06/2019 05/18/2019  WBC 4.0 - 10.5 K/uL 7.5 11.7(H) 19.1(H)  Hemoglobin 12.0 - 15.0 g/dL 8.6(L) 7.6(L) 10.3(L)  Hematocrit 36.0 - 46.0 % 29.1(L) 26.0(L) 33.6(L)  Platelets 150 - 400 K/uL 252 306 344    BMET BMP Latest Ref Rng & Units 10/07/2019 10/06/2019 05/18/2019  Glucose 70 - 99 mg/dL 89 96 115(H)  BUN 8 - 23 mg/dL 11 11 27(H)  Creatinine 0.44 - 1.00 mg/dL 0.74 0.89 1.10(H)  Sodium 135 - 145 mmol/L 144 140 137  Potassium 3.5 - 5.1 mmol/L 3.3(L) 2.6(LL) 4.6  Chloride 98 - 111 mmol/L 105 99 101  CO2 22 - 32 mmol/L _0 Calcium 8.9 - 10.3 mg/dL 8.7(L) 8.8(L) 9.4    LFT Hepatic Function Latest Ref Rng & Units 05/16/2019 11/19/2018 06/22/2018  Total Protein 6.5 - 8.1 g/dL 6.7 7.2 7.0  Albumin 3.5 - 5.0 g/dL 3.6 4.0 3.9  AST 15 - 41 U/L _1 ALT 0 - 44 U/L _2 Alk Phosphatase 38 - 126 U/L 93 99 78  Total Bilirubin 0.3 - 1.2 mg/dL 0.6 0.4 0.4  Bilirubin, Direct 0.0 - 0.3 mg/dL - - -     STUDIES: Dg Chest Portable 1 View  Result Date: 10/06/2019 CLINICAL DATA:  Chest pain EXAM: PORTABLE CHEST 1 VIEW COMPARISON:  May 16, 2019 FINDINGS: There is mild cardiomegaly. Mild central pulmonary vascular congestion. No large airspace consolidation or pleural effusion. No acute osseous abnormality. IMPRESSION: Pulmonary vascular congestion. Electronically Signed   By: Ebony Cargo.D.  On: 10/06/2019 16:04      Impression / Plan:   Jalyssa D Gerdts is a 62 y.o. female with chronic iron deficiency anemia, hypertension, history of  tobacco use, peripheral artery disease who was previously on DAPT, chronic iron deficiency anemia of unclear etiology. Patient with EGD and colonoscopy in the past with no clear etiology identified.  TSH is normal in the past.  Patient denies abnormal vaginal bleeding or hematuria.  No known chronic liver disease. Patient did not show up as outpatient for further evaluation.   Recommend parenteral iron and oral B12 Discussed with patient about enteroscopy, colonoscopy and possible video capsule endoscopy.  If GI work-up is negative, recommend hematology consult Patient is agreeable to undergo procedures as inpatient Recommend clear liquid diet N.p.o. past midnight Bowel prep tonight Dr. Wohl will be performing enteroscopy and colonoscopy tomorrow if she is prepped adequately  Thank you for involving me in the care of this patient.      LOS: 1 day    , MD  10/07/2019, 7:11 PM   Note: This dictation was prepared with Dragon dictation along with smaller phrase technology. Any transcriptional errors that result from this process are unintentional.  

## 2019-10-07 NOTE — Progress Notes (Signed)
[facility] [place] [facility]   PATIENT NAME: Traci Duran    MR#:  [id number]  DATE OF BIRTH:  Aug 14, 1956  SUBJECTIVE:  CHIEF COMPLAINT:   Chief Complaint  Patient presents with  . Chest Pain  no more chest pain, hungry,  REVIEW OF SYSTEMS:  Review of Systems  Constitutional: Negative for diaphoresis, fever, malaise/fatigue and weight loss.  HENT: Negative for ear discharge, ear pain, hearing loss, nosebleeds, sore throat and tinnitus.   Eyes: Negative for blurred vision and pain.  Respiratory: Negative for cough, hemoptysis, shortness of breath and wheezing.   Cardiovascular: Negative for chest pain, palpitations, orthopnea and leg swelling.  Gastrointestinal: Negative for abdominal pain, blood in stool, constipation, diarrhea, heartburn, nausea and vomiting.  Genitourinary: Negative for dysuria, frequency and urgency.  Musculoskeletal: Negative for back pain and myalgias.  Skin: Negative for itching and rash.  Neurological: Negative for dizziness, tingling, tremors, focal weakness, seizures, weakness and headaches.  Psychiatric/Behavioral: Negative for depression. The patient is not nervous/anxious.     DRUG ALLERGIES:   Allergies  Allergen Reactions  . Chantix [Varenicline] Hives, Shortness Of Breath and Palpitations  . Diphenhydramine Hcl Shortness Of Breath  . Potassium Itching and Swelling  . Benadryl [Diphenhydramine Hcl] Rash  . Niacin Rash  . Trazodone Palpitations   VITALS:  Blood pressure (!) 102/58, pulse 67, temperature 98.6 F (37 C), temperature source Oral, resp. rate 14, height 5\' 7"  (1.702 m), weight 65.8 kg, SpO2 96 %. PHYSICAL EXAMINATION:  Physical Exam HENT:     Head: Normocephalic and atraumatic.  Eyes:     Conjunctiva/sclera: Conjunctivae normal.     Pupils: Pupils are equal, round, and reactive to light.  Neck:     Musculoskeletal: Normal range of motion and neck supple.     Thyroid: No thyromegaly.   Trachea: No tracheal deviation.  Cardiovascular:     Rate and Rhythm: Normal rate and regular rhythm.     Heart sounds: Normal heart sounds.  Pulmonary:     Effort: Pulmonary effort is normal. No respiratory distress.     Breath sounds: Normal breath sounds. No wheezing.  Chest:     Chest wall: No tenderness.  Abdominal:     General: Bowel sounds are normal. There is no distension.     Palpations: Abdomen is soft.     Tenderness: There is no abdominal tenderness.  Musculoskeletal: Normal range of motion.  Skin:    General: Skin is warm and dry.     Findings: No rash.  Neurological:     Mental Status: She is alert and oriented to person, place, and time.     Cranial Nerves: No cranial nerve deficit.    LABORATORY PANEL:  Female CBC Recent Labs  Lab 10/07/19 0528  WBC 7.5  HGB 8.6*  HCT 29.1*  PLT 252   ------------------------------------------------------------------------------------------------------------------ Chemistries  Recent Labs  Lab 10/06/19 1745 10/07/19 0528  NA  --  144  K  --  3.3*  CL  --  105  CO2  --  27  GLUCOSE  --  89  BUN  --  11  CREATININE  --  0.74  CALCIUM  --  8.7*  MG 1.8  --    RADIOLOGY:  No results found. ASSESSMENT AND PLAN:  1.  Acute blood loss anemia with symptomatic anemia and guaiac positive - Hb 8.6 s/p 1 PRBC transfusion, GI planning push enteroscopy, colonoscopy tomorrow  2.  Hypokalemia - replete and recheck  3.  Hypertension - controlled  4.  Coronary artery disease on Coreg.  No blood thinners with symptomatic anemia. Since 2 high sensitivity troponin's are negative, this is not cardiac, no further work up.  5.  Chronic diastolic congestive heart failure.  No signs of heart failure at this time.  Restart cardiac medications.  Lasix with blood.  6.  Tobacco abuse.  Smoking cessation counseling done 4 minutes by me.  Nicotine patch ordered 7.  Anxiety depression continue psychiatric medications 8.  Neuropathy on  gabapentin. 9.  Tooth infection left lower molar in the back.  - continue Augmentin     All the records are reviewed and case discussed with Care Management/Social Worker. Management plans discussed with the patient, nursing and they are in agreement.  CODE STATUS: Full Code  TOTAL TIME TAKING CARE OF THIS PATIENT: 35 minutes.   More than 50% of the time was spent in counseling/coordination of care: YES  POSSIBLE D/C IN 1-2 DAYS, DEPENDING ON CLINICAL CONDITION and GI eval   Laddie Naeem M.D on 10/07/2019 at 6:46 PM  Between 7am to 6pm - Pager - [phone]  After 6pm go to [email or web address] - Social research officer, government  [facility]  Office  [phone]  CC: Primary care physician; [name]  Note: This dictation was prepared with Dragon dictation along with smaller phrase technology. Any transcriptional errors that result from this process are unintentional.

## 2019-10-07 NOTE — ED Notes (Signed)
Patient resting at present 

## 2019-10-07 NOTE — TOC Initial Note (Signed)
Transition of Care [facility]) - Initial/Assessment Note    Patient Details  Name: Traci Duran MRN: [id number] Date of Birth: September 24, 1956  Transition of Care [facility]) CM/SW Contact:    Margarito Liner, LCSW Phone Number: 10/07/2019, 10:16 AM  Clinical Narrative: Readmission prevention screen complete. CSW met with patient. No supports at bedside. CSW introduced role and explained that discharge planning would be discussed. Patient did not have any home health services and did not use any DME prior to admission. Patient is home with her grandson. Patient still drives. PCP is [facility] in [place]. Patient gets her medications mailed to her from [facility] in [place]. No issues affording medications. Patient lives home with her grandson. No further concerns. CSW encouraged patient to contact CSW as needed. CSW will continue to follow patient for support and facilitate return home when stable.   Expected Discharge Plan: Home/Self Care Barriers to Discharge: Continued Medical Work up   Patient Goals and CMS Choice     Choice offered to / list presented to : NA  Expected Discharge Plan and Services Expected Discharge Plan: Home/Self Care     Post Acute Care Choice: NA Living arrangements for the past 2 months: Single Family Home                                      Prior Living Arrangements/Services Living arrangements for the past 2 months: Single Family Home Lives with:: Relatives(Grandson) Patient language and need for interpreter reviewed:: Yes Do you feel safe going back to the place where you live?: Yes      Need for Family Participation in Patient Care: Yes (Comment) Care giver support system in place?: Yes (comment)   Criminal Activity/Legal Involvement Pertinent to Current Situation/Hospitalization: No - Comment as needed  Activities of Daily Living Home Assistive Devices/Equipment: None ADL Screening (condition at time of admission) Patient's  cognitive ability adequate to safely complete daily activities?: Yes Is the patient deaf or have difficulty hearing?: No Does the patient have difficulty seeing, even when wearing glasses/contacts?: No Does the patient have difficulty concentrating, remembering, or making decisions?: No Patient able to express need for assistance with ADLs?: Yes Does the patient have difficulty dressing or bathing?: No Independently performs ADLs?: Yes (appropriate for developmental age) Does the patient have difficulty walking or climbing stairs?: No Weakness of Legs: None Weakness of Arms/Hands: None  Permission Sought/Granted                  Emotional Assessment Appearance:: Appears stated age Attitude/Demeanor/Rapport: Engaged, Gracious Affect (typically observed): Accepting, Appropriate, Calm, Pleasant Orientation: : Oriented to Self, Oriented to Place, Oriented to  Time, Oriented to Situation Alcohol / Substance Use: Tobacco Use Psych Involvement: No (comment)  Admission diagnosis:  Nonspecific chest pain [R07.9] Occult GI bleeding [R19.5] Symptomatic anemia [D64.9] Patient Active Problem List   Diagnosis Date Noted  . Anemia 10/06/2019  . Acute CHF (congestive heart failure) (HCC) 05/16/2019  . Rectal bleed 11/19/2018  . 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 Benzodiazepine abuse (HCC) 09/03/2018  . Moderate recurrent major depression (HCC) 09/03/2018  . Hyponatremia 10/11/2017  . Chest pain 07/28/2017  . Acute blood loss anemia 07/28/2017  . Demand ischemia (HCC)   . Benign neoplasm of descending colon   . Polyp of sigmoid colon   . Age related osteoporosis 04/16/2017  . Iron deficiency anemia secondary to  blood loss (chronic)   . GI bleed 04/06/2017  . Diastolic dysfunction 09/30/2016  . Opioid contract exists 06/27/2016  . Incidental lung nodule 01/18/2016  . Traumatic hemorrhage of cerebrum (HCC) 12/04/2015  . Hypovitaminosis D 08/29/2015  . Thyroid lesion  08/29/2015  . Major depressive disorder, single episode, unspecified 08/27/2015  . 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 Gastroesophageal reflux disease 01/26/2015  . Osteopenia 01/26/2015  . Anemia, unspecified 12/27/2014  . History of fall 12/27/2014  . Greater tuberosity of humerus fracture 10/24/2013  . AVF (arteriovenous fistula) (HCC) 11/08/2011  . Anxiety 10/24/2011  . Cerebrovascular disease 10/24/2011  . Dyslipidemia 10/24/2011  . Hypertension 10/24/2011  . Tobacco abuse 10/24/2011  . Palpitations 10/24/2011  . Renal artery stenosis (HCC) 10/24/2011  . Unstable angina (HCC) 10/14/2011  . Hyperlipidemia   . Chronic obstructive pulmonary disease (HCC)   . Depression   . Peripheral arterial disease (HCC)   . Coronary artery disease, non-occlusive    PCP:  [name]:   [facility] [place], [place] - [address] [place] [address] Phone: [phone] Fax: [phone]  [id number] - [place], [place] - [address] [place] [address] Phone: [phone] Fax: [phone]  [facility], [place] - [address] [place] [address] Phone: [phone] Fax: [phone]     Social Determinants of Health (SDOH) Interventions    Readmission Risk Interventions Readmission Risk Prevention Plan 10/07/2019  Transportation Screening Complete  PCP or Specialist Appt within 3-5 Days Complete  HRI or Home Care Consult Complete  Social Work Consult for Recovery Care Planning/Counseling Complete  Palliative Care Screening Not Applicable  Medication Review Oceanographer) Complete  Some recent data might be hidden

## 2019-10-07 NOTE — ED Notes (Addendum)
Purewick and brief changed, patient repositioned  Patient provided with ginger ale

## 2019-10-07 NOTE — ED Notes (Addendum)
Patient resting; VS changed to every hour

## 2019-10-08 ENCOUNTER — Inpatient Hospital Stay: Payer: Medicare Other | Admitting: Anesthesiology

## 2019-10-08 ENCOUNTER — Encounter
Admission: EM | Disposition: A | Discharge status: Home / Self Care | Payer: Self-pay | Source: Home / Self Care | Attending: Internal Medicine

## 2019-10-08 DIAGNOSIS — D509 Iron deficiency anemia, unspecified: Secondary | ICD-10-CM | POA: Diagnosis not present

## 2019-10-08 HISTORY — PX: COLONOSCOPY WITH PROPOFOL: SHX5780

## 2019-10-08 HISTORY — PX: ENTEROSCOPY: SHX5533

## 2019-10-08 HISTORY — PX: GIVENS CAPSULE STUDY: SHX5432

## 2019-10-08 LAB — BASIC METABOLIC PANEL
Anion gap: 10 (ref 5–15)
BUN: 6 mg/dL — ABNORMAL LOW (ref 8–23)
CO2: 27 mmol/L (ref 22–32)
Calcium: 9.3 mg/dL (ref 8.9–10.3)
Chloride: 102 mmol/L (ref 98–111)
Creatinine, Ser: 0.74 mg/dL (ref 0.44–1.00)
GFR calc Af Amer: >60 mL/min (ref >60)
GFR calc non Af Amer: >60 mL/min (ref >60)
Glucose, Bld: 82 mg/dL (ref 70–99)
Potassium: 3.2 mmol/L — ABNORMAL LOW (ref 3.5–5.1)
Sodium: 139 mmol/L (ref 135–145)

## 2019-10-08 LAB — CBC
HCT: 31.2 % — ABNORMAL LOW (ref 36.0–46.0)
Hemoglobin: 9.3 g/dL — ABNORMAL LOW (ref 12.0–15.0)
MCH: 22.2 pg — ABNORMAL LOW (ref 26.0–34.0)
MCHC: 29.8 g/dL — ABNORMAL LOW (ref 30.0–36.0)
MCV: 74.6 fL — ABNORMAL LOW (ref 80.0–100.0)
Platelets: 279 10*3/uL (ref 150–400)
RBC: 4.18 MIL/uL (ref 3.87–5.11)
RDW: 17.2 % — ABNORMAL HIGH (ref 11.5–15.5)
WBC: 9.7 10*3/uL (ref 4.0–10.5)
nRBC: 0 % (ref 0.0–0.2)

## 2019-10-08 LAB — HEMOGLOBIN AND HEMATOCRIT, BLOOD
HCT: 33.7 % — ABNORMAL LOW (ref 36.0–46.0)
Hemoglobin: 10 g/dL — ABNORMAL LOW (ref 12.0–15.0)

## 2019-10-08 SURGERY — ENTEROSCOPY
Anesthesia: General

## 2019-10-08 MED ORDER — PROPOFOL 10 MG/ML IV BOLUS
INTRAVENOUS | Status: DC | PRN
  Administered 2019-10-08: 90 mg via INTRAVENOUS

## 2019-10-08 MED ORDER — POLYETHYLENE GLYCOL 3350 17 GM/SCOOP PO POWD
1.0000 | Freq: Once | ORAL | Status: AC
  Administered 2019-10-08: 255 g via ORAL
  Filled 2019-10-08: qty 255

## 2019-10-08 MED ORDER — PROPOFOL 500 MG/50ML IV EMUL
INTRAVENOUS | Status: DC | PRN
  Administered 2019-10-08: 120 ug/kg/min via INTRAVENOUS

## 2019-10-08 MED ORDER — SODIUM CHLORIDE 0.9 % IV SOLN
INTRAVENOUS | Status: DC
  Administered 2019-10-08: 11:00:00 via INTRAVENOUS

## 2019-10-08 MED ORDER — PROPOFOL 500 MG/50ML IV EMUL
INTRAVENOUS | Status: AC
  Filled 2019-10-08: qty 50

## 2019-10-08 NOTE — Anesthesia Postprocedure Evaluation (Signed)
Anesthesia Post Note  Patient: Traci Duran  Procedure(s) Performed: ENTEROSCOPY (N/A ) COLONOSCOPY WITH PROPOFOL (N/A ) GIVENS CAPSULE STUDY (N/A )  Patient location during evaluation: Endoscopy Anesthesia Type: General Level of consciousness: awake and alert Pain management: pain level controlled Vital Signs Assessment: post-procedure vital signs reviewed and stable Respiratory status: spontaneous breathing and respiratory function stable Cardiovascular status: stable Anesthetic complications: no     Last Vitals:  Vitals:   10/08/19 1043 10/08/19 1151  BP: (!) 153/71 (!) 112/47  Pulse: 70   Resp: 20 16  Temp: (!) 36.1 C (!) 36.1 C  SpO2: 97%     Last Pain:  Vitals:   10/08/19 1221  TempSrc:   PainSc: 0-No pain                 KEPHART,WILLIAM K

## 2019-10-08 NOTE — Progress Notes (Signed)
Patient notified this RN of blood clots passed with BM in toilet. Writer noted on two occasions medium blood clots- bright red, fresh appearing blood in the toilet. Dr. Allen Norris notified, recommended to hold anticoagulants (none scheduled at this time) and obtain q6 h/h. Orders placed, will continue to monitor.

## 2019-10-08 NOTE — Anesthesia Post-op Follow-up Note (Signed)
Anesthesia QCDR form completed.        

## 2019-10-08 NOTE — Op Note (Signed)
[facility] Patient Name: Traci Duran Procedure Date: 10/08/2019 11:07 AM MRN: [id number] Account #: [id number] Date of Birth: [birth year] Admit Type: Inpatient Age: 63 Room: [facility] ENDO ROOM 4 Gender: Female Note Status: Finalized Procedure:            Colonoscopy Indications:          Iron deficiency anemia Providers:            Midge Minium MD, MD Referring MD:         No Local Md, MD (Referring MD) Medicines:            Propofol per Anesthesia Complications:        No immediate complications. Procedure:            Pre-Anesthesia Assessment:                       - Prior to the procedure, a History and Physical was                        performed, and patient medications and allergies were                        reviewed. The patient's tolerance of previous                        anesthesia was also reviewed. The risks and benefits of                        the procedure and the sedation options and risks were                        discussed with the patient. All questions were                        answered, and informed consent was obtained. Prior                        Anticoagulants: The patient has taken no previous                        anticoagulant or antiplatelet agents. ASA Grade                        Assessment: II - A patient with mild systemic disease.                        After reviewing the risks and benefits, the patient was                        deemed in satisfactory condition to undergo the                        procedure.                       After obtaining informed consent, the colonoscope was                        passed under direct vision. Throughout the procedure,  the patient's blood pressure, pulse, and oxygen                        saturations were monitored continuously. The                        Colonoscope was introduced through the anus and                        advanced to  the the cecum, identified by appendiceal                        orifice and ileocecal valve. The colonoscopy was                        performed without difficulty. The patient tolerated the                        procedure well. The quality of the bowel preparation                        was excellent. Findings:      The perianal and digital rectal examinations were normal.      A 6 mm polyp was found in the sigmoid colon. The polyp was sessile. The       polyp was removed with a cold snare. Resection and retrieval were       complete.      Two small angiodysplastic lesions without bleeding were found in the       ascending colon. Coagulation for hemostasis using argon plasma at 2       liters/minute and 20 watts was successful.      Multiple small-mouthed diverticula were found in the sigmoid colon.      Non-bleeding internal hemorrhoids were found during retroflexion. The       hemorrhoids were Grade I (internal hemorrhoids that do not prolapse). Impression:           - One 6 mm polyp in the sigmoid colon, removed with a                        cold snare. Resected and retrieved.                       - Two non-bleeding colonic angiodysplastic lesions.                        Treated with argon plasma coagulation (APC).                       - Diverticulosis in the sigmoid colon.                       - Non-bleeding internal hemorrhoids. Recommendation:       - Discharge patient to home.                       - Resume previous diet.                       - Continue present medications.                       -  Await pathology results.                       - Repeat colonoscopy in 5 years if polyp adenoma and 10                        years if hyperplastic                       - To visualize the small bowel, perform video capsule                        endoscopy today. Procedure Code(s):    --- Professional ---                       [phone], 59, Colonoscopy, flexible; with control of                         bleeding, any method                       45385, Colonoscopy, flexible; with removal of tumor(s),                        polyp(s), or other lesion(s) by snare technique Diagnosis Code(s):    --- Professional ---                       D50.9, Iron deficiency anemia, unspecified                       K55.20, Angiodysplasia of colon without hemorrhage                       K63.5, Polyp of colon CPT copyright 2019 American Medical Association. All rights reserved. The codes documented in this report are preliminary and upon coder review may  be revised to meet current compliance requirements. Midge Minium MD, MD 10/08/2019 11:49:11 AM This report has been signed electronically. Number of Addenda: 0 Note Initiated On: 10/08/2019 11:07 AM Scope Withdrawal Time: 0 hours 8 minutes 15 seconds  Total Procedure Duration: 0 hours 12 minutes 13 seconds  Estimated Blood Loss: Estimated blood loss: none.      [facility]

## 2019-10-08 NOTE — Anesthesia Preprocedure Evaluation (Signed)
Anesthesia Evaluation  Patient identified by MRN, date of birth, ID band Patient awake    Reviewed: Allergy & Precautions, NPO status , Patient's Chart, lab work & pertinent test results  History of Anesthesia Complications Negative for: history of anesthetic complications  Airway Mallampati: II       Dental  (+) Loose, Missing, Chipped   Pulmonary neg sleep apnea, COPD, Current Smoker,           Cardiovascular hypertension, Pt. on medications + CAD, + Past MI and +CHF  (-) dysrhythmias (-) Cardiac Defibrillator      Neuro/Psych neg Seizures Anxiety Depression Schizophrenia CVA (L leg weakness, resolved), No Residual Symptoms    GI/Hepatic Neg liver ROS, GERD  ,  Endo/Other  Hypothyroidism   Renal/GU negative Renal ROS     Musculoskeletal   Abdominal   Peds  Hematology  (+) anemia ,   Anesthesia Other Findings   Reproductive/Obstetrics                             Anesthesia Physical Anesthesia Plan  ASA: III and emergent  Anesthesia Plan: General   Post-op Pain Management:    Induction: Intravenous  PONV Risk Score and Plan: 2 and Propofol infusion and TIVA  Airway Management Planned: Nasal Cannula  Additional Equipment:   Intra-op Plan:   Post-operative Plan:   Informed Consent: I have reviewed the patients History and Physical, chart, labs and discussed the procedure including the risks, benefits and alternatives for the proposed anesthesia with the patient or authorized representative who has indicated his/her understanding and acceptance.       Plan Discussed with:   Anesthesia Plan Comments:         Anesthesia Quick Evaluation

## 2019-10-08 NOTE — OR Nursing (Signed)
Report to chelsea rn after endoscopic procedures. Pt to be npo until 1430. Then regular meal after 1630 per dr Allen Norris. Capsule study initiated. rn to retrieve device in am. Transferred back to floor

## 2019-10-08 NOTE — Op Note (Signed)
[facility]astroenterology Patient Name: Traci Duran Procedure Date: 10/08/2019 11:08 AM MRN: [id number] Account #: [id number] Date of Birth: January 02, 1956 Admit Type: Inpatient Age: 63 Room: [facility] ENDO ROOM 4 Gender: Female Note Status: Finalized Procedure:            Upper GI endoscopy Indications:          Iron deficiency anemia Providers:            Midge Minium MD, MD Referring MD:         No Local Md, MD (Referring MD) Medicines:            Propofol per Anesthesia Complications:        No immediate complications. Procedure:            Pre-Anesthesia Assessment:                       - Prior to the procedure, a History and Physical was                        performed, and patient medications and allergies were                        reviewed. The patient's tolerance of previous                        anesthesia was also reviewed. The risks and benefits of                        the procedure and the sedation options and risks were                        discussed with the patient. All questions were                        answered, and informed consent was obtained. Prior                        Anticoagulants: The patient has taken no previous                        anticoagulant or antiplatelet agents. ASA Grade                        Assessment: II - A patient with mild systemic disease.                        After reviewing the risks and benefits, the patient was                        deemed in satisfactory condition to undergo the                        procedure.                       After obtaining informed consent, the endoscope was                        passed under direct vision. Throughout the procedure,  the patient's blood pressure, pulse, and oxygen                        saturations were monitored continuously. The Endoscope                        was introduced through the mouth and advanced to the         second part of duodenum. The small bowel enteroscopy                        was accomplished without difficulty. The patient                        tolerated the procedure well. Findings:      A medium-sized hiatal hernia with a single Cameron ulcer was found. The       proximal extent of the gastric folds (end of tubular esophagus) was in       the lower third of the esophagus. Coagulation for hemostasis using argon       plasma at 2 liters/minute and 20 watts was successful.      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 Coagulation for       hemostasis using argon plasma at 2 liters/minute and 20 watts was       successful.      Scattered mild inflammation characterized by erythema was found in the       gastric antrum.      The examined duodenum was normal. Impression:           - Medium-sized hiatal hernia with a single Cameron                        ulcer. Treated with argon plasma coagulation (APC).                       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 Treated with argon plasma coagulation (APC).                       - Gastritis.                       - Normal examined duodenum.                       - No specimens collected. Recommendation:       - Discharge patient to home.                       - Resume previous diet.                       - Continue present medications.                       - Perform a colonoscopy today. Procedure Code(s):    --- Professional ---                       [phone], Esophagogastroduodenoscopy,  flexible, transoral;                        with control of bleeding, any method Diagnosis Code(s):    --- Professional ---                       D50.9, Iron deficiency anemia, unspecified                       K22.8, Other specified diseases of esophagus                       K25.9, Gastric ulcer, unspecified as  acute or chronic,                        without hemorrhage or perforation CPT copyright 2019 American Medical Association. All rights reserved. The codes documented in this report are preliminary and upon coder review may  be revised to meet current compliance requirements. Midge Minium MD, MD 10/08/2019 11:32:02 AM This report has been signed electronically. Number of Addenda: 0 Note Initiated On: 10/08/2019 11:08 AM Estimated Blood Loss: Estimated blood loss: none.      [facility]

## 2019-10-08 NOTE — Progress Notes (Signed)
At beginning of Venofer administration, Probation officer educated patient on hypersensitivity and allergic reactions to Venofer and informed patient to let us know if she felt anything new or different at all, including pain, itching or tingling, or nausea. Began infusion at 1615. At approximately 1740, patient rang call bell to inform nurse of "arm swelling."  Upon assessment, it was clear the IV had infiltrated, as the forearm was extremely swollen and firm. IV site was clean, dry, and intact. This RN immediately stopped infusion, unhooked infusion and attempted to aspirate fluid back into an empty syringe without results. Pharmacy consulted and MD notified.   Per Dr. Ferne Reus, warm compresses and elevation. IV team consult placed, warm compress applied. Patient educated and in good spirits.

## 2019-10-08 NOTE — Transfer of Care (Signed)
Immediate Anesthesia Transfer of Care Note  Patient: Traci Duran  Procedure(s) Performed: ENTEROSCOPY (N/A ) COLONOSCOPY WITH PROPOFOL (N/A ) GIVENS CAPSULE STUDY (N/A )  Patient Location: PACU  Anesthesia Type:General  Level of Consciousness: awake, alert  and oriented  Airway & Oxygen Therapy: Patient Spontanous Breathing  Post-op Assessment: Report given to RN and Post -op Vital signs reviewed and stable  Post vital signs: Reviewed and stable  Last Vitals:  Vitals Value Taken Time  BP 112/47 10/08/19 1152  Temp 36.1 C 10/08/19 1151  Pulse 68 10/08/19 1153  Resp 17 10/08/19 1153  SpO2 97 % 10/08/19 1153  Vitals shown include unvalidated device data.  Last Pain:  Vitals:   10/08/19 1151  TempSrc: Tympanic  PainSc: 0-No pain         Complications: No apparent anesthesia complications

## 2019-10-09 DIAGNOSIS — R195 Other fecal abnormalities: Secondary | ICD-10-CM

## 2019-10-09 LAB — HEMOGLOBIN AND HEMATOCRIT, BLOOD
HCT: 32.5 % — ABNORMAL LOW (ref 36.0–46.0)
HCT: 32.5 % — ABNORMAL LOW (ref 36.0–46.0)
HCT: 32.4 % — ABNORMAL LOW (ref 36.0–46.0)
Hemoglobin: 9.4 g/dL — ABNORMAL LOW (ref 12.0–15.0)
Hemoglobin: 9.6 g/dL — ABNORMAL LOW (ref 12.0–15.0)
Hemoglobin: 9.4 g/dL — ABNORMAL LOW (ref 12.0–15.0)

## 2019-10-09 MED ORDER — AMOXICILLIN-POT CLAVULANATE 875-125 MG PO TABS: 1.0000 | ORAL_TABLET | Freq: Two times a day (BID) | ORAL | 0 refills | Status: DC

## 2019-10-09 MED ORDER — PANTOPRAZOLE SODIUM 40 MG PO TBEC: 40.0000 mg | DELAYED_RELEASE_TABLET | Freq: Two times a day (BID) | ORAL | 0 refills | Status: DC

## 2019-10-09 NOTE — Plan of Care (Signed)

## 2019-10-09 NOTE — Discharge Instructions (Signed)
Gastrointestinal Bleeding °Gastrointestinal (GI) bleeding is bleeding somewhere along the path that food travels through the body (digestive tract). This path is anywhere between the mouth and the opening of the butt (anus). You may have blood in your poop (stool) or have black poop. If you throw up (vomit), there may be blood in it. °This condition can be mild, serious, or even life-threatening. If you have a lot of bleeding, you may need to stay in the hospital. °What are the causes? °This condition may be caused by: °· Irritation and swelling of the esophagus (esophagitis). The esophagus is part of the body that moves food from your mouth to your stomach. °· Swollen veins in the butt (hemorrhoids). °· Areas of painful tearing in the opening of the butt (anal fissures). These are often caused by passing hard poop. °· Pouches that form on the colon over time (diverticulosis). °· Irritation and swelling (diverticulitis) in areas where pouches have formed on the colon. °· Growths (polyps) or cancer. Colon cancer often starts out as growths that are not cancer. °· Irritation of the stomach lining (gastritis). °· Sores (ulcers) in the stomach. °What increases the risk? °You are more likely to develop this condition if you: °· Have a certain type of infection in your stomach (Helicobacter pylori infection). °· Take certain medicines. °· Smoke. °· Drink alcohol. °What are the signs or symptoms? °Common symptoms of this condition include: °· Throwing up (vomiting) material that has bright red blood in it. It may look like coffee grounds. °· Changes in your poop. The poop may: °? Have red blood in it. °? Be black, look like tar, and smell stronger than normal. °? Be red. °· Pain or cramping in the belly (abdomen). °How is this treated? °Treatment for this condition depends on the cause of the bleeding. For example: °· Sometimes, the bleeding can be stopped during a procedure that is done to find the problem (endoscopy or  colonoscopy). °· Medicines can be used to: °? Help control irritation, swelling, or infection. °? Reduce acid in your stomach. °· Certain problems can be treated with: °? Creams. °? Medicines that are put in the butt (suppositories). °? Warm baths. °· Surgery is sometimes needed. °· If you lose a lot of blood, you may need a blood transfusion. °If bleeding is mild, you may be allowed to go home. If there is a lot of bleeding, you will need to stay in the hospital. °Follow these instructions at home: ° °· Take over-the-counter and prescription medicines only as told by your doctor. °· Eat foods that have a lot of fiber in them. These foods include beans, whole grains, and fresh fruits and vegetables. You can also try eating 1-3 prunes each day. °· Drink enough fluid to keep your pee (urine) pale yellow. °· Keep all follow-up visits as told by your doctor. This is important. °Contact a doctor if: °· Your symptoms do not get better. °Get help right away if: °· Your bleeding does not stop. °· You feel dizzy or you pass out (faint). °· You feel weak. °· You have very bad cramps in your back or belly. °· You pass large clumps of blood (clots) in your poop. °· Your symptoms are getting worse. °· You have chest pain or fast heartbeats. °Summary °· GI bleeding is bleeding somewhere along the path that food travels through the body (digestive tract). °· This bleeding can be caused by many things. Treatment depends on the cause of the bleeding. °·   Take medicines only as told by your doctor. °· Keep all follow-up visits as told by your doctor. This is important. °This information is not intended to replace advice given to you by your health care provider. Make sure you discuss any questions you have with your health care provider. °Document Released: 09/02/2008 Document Revised: 07/07/2018 Document Reviewed: 07/07/2018 °Elsevier Patient Education © 2020 [employer] ° °

## 2019-10-09 NOTE — Progress Notes (Signed)
MD order received in [facility] to discharge pt home today; verbally reviewed AVS with pt, Rxs escribed to pt's home pharmacy; no questions voiced at this time; pt's discharge pending arrival of her ride home at the [facility] entrance

## 2019-10-09 NOTE — Progress Notes (Signed)
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  The patient had a sigmoid colon polyp that was removed with cold snare.  The patient had the AVMs cauterized.  Shortly after the procedure the patient noted some rectal bleeding with passing of clots.  Her hemoglobin went from 10 to 9.4 and was 9.6 this morning.  The patient denies any further rectal bleeding.  There is no report of any abdominal pain nausea vomiting.   Objective: Vital signs in last 24 hours: Vitals:   10/08/19 1400 10/08/19 1705 10/08/19 1951 10/09/19 0437  BP: (!) 172/80 (!) 170/77 136/78 125/66  Pulse: 71 77 78 72  Resp: 17 18 18 18   Temp: 97.7 F (36.5 C) 97.8 F (36.6 C) 98.4 F (36.9 C) (!) 97.5 F (36.4 C)  TempSrc: Oral Oral Oral Oral  SpO2: 96% 90% 91% 93%  Weight:      Height:       Weight change:   Intake/Output Summary (Last 24 hours) at 10/09/2019 1553 Last data filed at 10/09/2019 1035 Gross per 24 hour  Intake 420.51 ml  Output -  Net 420.51 ml     Exam: Heart:: Regular rate and rhythm, S1S2 present or without murmur or extra heart sounds Lungs: normal and clear to auscultation and percussion Abdomen: soft, nontender, normal bowel sounds   Lab Results: @LABTEST2 @ Micro Results: Recent Results (from the past 240 hour(s))  SARS CORONAVIRUS 2 (TAT 6-24 HRS) Nasopharyngeal Nasopharyngeal Swab     Status: None   Collection Time: 10/06/19  5:58 PM   Specimen: Nasopharyngeal Swab  Result Value Ref Range Status   SARS Coronavirus 2 NEGATIVE NEGATIVE Final    Comment: (NOTE) SARS-CoV-2 target nucleic acids are NOT DETECTED. The SARS-CoV-2 RNA is generally detectable in upper and lower respiratory specimens during the acute phase of infection. Negative results do not preclude SARS-CoV-2 infection, do not rule out co-infections with  other pathogens, and should not be used as the sole basis for treatment or other patient management decisions. Negative results must be combined with clinical observations, patient history, and epidemiological information. The expected result is Negative. Fact Sheet for Patients: SugarRoll.be Fact Sheet for Healthcare Providers: https://www.woods-mathews.com/ This test is not yet approved or cleared by the [place] FDA and  has been authorized for detection and/or diagnosis of SARS-CoV-2 by FDA under an Emergency Use Authorization (EUA). This EUA will remain  in effect (meaning this test can be used) for the duration of the COVID-19 declaration under Section 56 4(b)(1) of the Act, 21 U.S.C. section 360bbb-3(b)(1), unless the authorization is terminated or revoked sooner. Performed at [facility], [place] [address] [place], [place] [address]    Studies/Results: No results found. Medications: I have reviewed the patient's current medications. Scheduled Meds: . alprazolam  2 mg Oral BID  . amoxicillin-clavulanate  1 tablet Oral Q12H  . atorvastatin  80 mg Oral Daily  . busPIRone  10 mg Oral BID  . carvedilol  6.25 mg Oral BID  . citalopram  40 mg Oral Daily  . DULoxetine  30 mg Oral Daily  . furosemide  40 mg Oral Daily  . gabapentin  300 mg Oral BID  . lisinopril  20 mg Oral Daily  . mometasone-formoterol  2 puff Inhalation BID  . nicotine  7 mg Transdermal Daily  . pantoprazole (PROTONIX)  IV  40 mg Intravenous Q12H  . vitamin B-12  1,000 mcg Oral Daily  . Vitamin D (Ergocalciferol)  50,000 Units Oral Q7 days   Continuous Infusions: . sodium chloride 0 mL/hr at 10/07/19 2114   PRN Meds:.sodium chloride, acetaminophen **OR** acetaminophen, ipratropium-albuterol, nitroGLYCERIN, ondansetron **OR** ondansetron (ZOFRAN) IV, traMADol   Assessment: Active Problems:   Symptomatic anemia   Occult GI bleeding    Plan: The  patient had multiple AVMs that were treated yesterday during her EGD and colonoscopy with a post polypectomy bleed that has since stopped.  The patient should be on a low residual diet at this time.  The patient also had a capsule endoscopy placed after EGD and colonoscopy with downloading of the information being done today and hopefully the capsule will be ready to be read tomorrow or the following day.  The patient has been explained the plan and agrees with it.   LOS: 3 days   Traci Duran 10/09/2019, 3:53 PM Pager [phone] 7am-5pm  Check AMION for 5pm -7am coverage and on weekends

## 2019-10-09 NOTE — Progress Notes (Signed)
Pt discharged via wheelchair by nursing to the [facility] 

## 2019-10-10 ENCOUNTER — Encounter: Payer: Self-pay | Admitting: Gastroenterology

## 2019-10-11 LAB — SURGICAL PATHOLOGY

## 2019-10-11 NOTE — Discharge Summary (Signed)
[facility]   PATIENT NAME: Traci Duran    MR#:  [id number]  DATE OF BIRTH:  Feb 02, 1956  DATE OF ADMISSION:  10/06/2019   ADMITTING PHYSICIAN: Alford Highland, MD  DATE OF DISCHARGE: 10/09/2019  3:02 PM  PRIMARY CARE PHYSICIAN: [place]   ADMISSION DIAGNOSIS:  Nonspecific chest pain [R07.9] Occult GI bleeding [R19.5] Symptomatic anemia [D64.9] DISCHARGE DIAGNOSIS:  Active Problems:   Symptomatic anemia   Occult GI bleeding  SECONDARY DIAGNOSIS:   Past Medical History:  Diagnosis Date  . Anemia   . Anxiety   . CHF (congestive heart failure) (HCC)   . COPD (chronic obstructive pulmonary disease) (HCC)   . Coronary artery disease   . Depression   . Gastritis   . GERD (gastroesophageal reflux disease)   . Hemorrhoids   . Hyperlipidemia   . Hypertension   . Myocardial infarction (HCC)   . PAD (peripheral artery disease) (HCC)    HOSPITAL COURSE:   1. Acute blood loss anemia - Hb 9.4 s/p 1 PRBC transfusion - The patient had multiple AVMs that were treated during her EGD and colonoscopy on 10/31 with a post polypectomy bleed that has since stopped.  The patient should be on a low residual diet at this time.  The patient also had a capsule endoscopy placed after EGD and colonoscopy and GI will inform patient once data available. The patient has been explained the plan and agrees with it.  2. Hypokalemia - repleted  3. Hypertension - controlled  4. Coronary artery disease on Coreg. No blood thinners with symptomatic anemia. Since 2 high sensitivity troponin's are negative, this is not cardiac, no further work up.  5. Chronic diastolic congestive heart failure - well compensated  6. Tobacco abuse. Smoking cessation counseling done 4 minutes by me. Nicotine patch ordered 7. Anxiety depression continue psychiatric medications 8. Neuropathy on gabapentin. 9. Tooth infection left lower molar in the back. -  continue Augmentin  DISCHARGE CONDITIONS:  stable CONSULTS OBTAINED:  Treatment Team:  Toney Reil, MD DRUG ALLERGIES:   Allergies  Allergen Reactions  . Chantix [Varenicline] Hives, Shortness Of Breath and Palpitations  . Diphenhydramine Hcl Shortness Of Breath  . Potassium Itching and Swelling  . Benadryl [Diphenhydramine Hcl] Rash  . Niacin Rash  . Trazodone Palpitations   DISCHARGE MEDICATIONS:   Allergies as of 10/09/2019      Reactions   Chantix [varenicline] Hives, Shortness Of Breath, Palpitations   Diphenhydramine Hcl Shortness Of Breath   Potassium Itching, Swelling   Benadryl [diphenhydramine Hcl] Rash   Niacin Rash   Trazodone Palpitations      Medication List    STOP taking these medications   azithromycin 250 MG tablet Commonly known as: ZITHROMAX   traMADol 50 MG tablet Commonly known as: Ultram     TAKE these medications   acetaminophen 325 MG tablet Commonly known as: TYLENOL Take 3 tablets (975 mg total) by mouth 3 (three) times daily as needed.   albuterol (2.5 MG/3ML) 0.083% nebulizer solution Commonly known as: PROVENTIL Take 3 mLs (2.5 mg total) by nebulization every 6 (six) hours as needed for wheezing or shortness of breath.   alprazolam 2 MG tablet Commonly known as: XANAX Take 2 mg by mouth 2 (two) times daily.   amoxicillin-clavulanate 875-125 MG tablet Commonly known as: AUGMENTIN Take 1 tablet by mouth every 12 (twelve) hours.   atorvastatin 80 MG tablet Commonly known as: LIPITOR Take 80 mg by  mouth daily.   busPIRone 10 MG tablet Commonly known as: BUSPAR Take 10 mg by mouth 2 (two) times daily.   carvedilol 6.25 MG tablet Commonly known as: COREG Take 6.25 mg by mouth 2 (two) times daily.   citalopram 40 MG tablet Commonly known as: CELEXA Take 40 mg by mouth daily.   DULoxetine 30 MG capsule Commonly known as: CYMBALTA Take 30 mg by mouth daily.   furosemide 40 MG tablet Commonly known as:  LASIX Take 1 tablet (40 mg total) by mouth daily. What changed: how much to take   gabapentin 300 MG capsule Commonly known as: NEURONTIN Take 300 mg by mouth 2 (two) times daily.   ipratropium-albuterol 0.5-2.5 (3) MG/3ML Soln Commonly known as: DUONEB Take 3 mLs by nebulization every 6 (six) hours as needed.   lisinopril 20 MG tablet Commonly known as: ZESTRIL Take 20 mg by mouth daily.   nitroGLYCERIN 0.4 MG SL tablet Commonly known as: NITROSTAT Place 0.4 mg under the tongue as needed.   pantoprazole 40 MG tablet Commonly known as: PROTONIX Take 1 tablet (40 mg total) by mouth 2 (two) times daily before a meal. What changed:  medication strength how much to take when to take this   pregabalin 150 MG capsule Commonly known as: LYRICA Take 150 mg by mouth 2 (two) times daily.   Symbicort 80-4.5 MCG/ACT inhaler Generic drug: budesonide-formoterol Inhale 2 puffs into the lungs 2 (two) times daily.   Vitamin D (Ergocalciferol) 1.25 MG (50000 UT) Caps capsule Commonly known as: DRISDOL Take 50,000 Units by mouth every Saturday.        DISCHARGE INSTRUCTIONS:   DIET:  Regular diet DISCHARGE CONDITION:  Good ACTIVITY:  Activity as tolerated OXYGEN:  Home Oxygen: No.  Oxygen Delivery: room air DISCHARGE LOCATION:  home   If you experience worsening of your admission symptoms, develop shortness of breath, life threatening emergency, suicidal or homicidal thoughts you must seek medical attention immediately by calling 911 or calling your MD immediately  if symptoms less severe.  You Must read complete instructions/literature along with all the possible adverse reactions/side effects for all the Medicines you take and that have been prescribed to you. Take any new Medicines after you have completely understood and accpet all the possible adverse reactions/side effects.   Please note  You were cared for by a hospitalist during your hospital stay. If you have any  questions about your discharge medications or the care you received while you were in the hospital after you are discharged, you can call the unit and asked to speak with the hospitalist on call if the hospitalist that took care of you is not available. Once you are discharged, your primary care physician will handle any further medical issues. Please note that NO REFILLS for any discharge medications will be authorized once you are discharged, as it is imperative that you return to your primary care physician (or establish a relationship with a primary care physician if you do not have one) for your aftercare needs so that they can reassess your need for medications and monitor your lab values.    On the day of Discharge:  VITAL SIGNS:  Blood pressure 125/66, pulse 72, temperature (!) 97.5 F (36.4 C), temperature source Oral, resp. rate 18, height 5\' 7"  (1.702 m), weight 65.7 kg, SpO2 93 %. PHYSICAL EXAMINATION:  GENERAL:  63 y.o.-year-old patient lying in the bed with no acute distress.  EYES: Pupils equal, round, reactive to light and  accommodation. No scleral icterus. Extraocular muscles intact.  HEENT: Head atraumatic, normocephalic. Oropharynx and nasopharynx clear.  NECK:  Supple, no jugular venous distention. No thyroid enlargement, no tenderness.  LUNGS: Normal breath sounds bilaterally, no wheezing, rales,rhonchi or crepitation. No use of accessory muscles of respiration.  CARDIOVASCULAR: S1, S2 normal. No murmurs, rubs, or gallops.  ABDOMEN: Soft, non-tender, non-distended. Bowel sounds present. No organomegaly or mass.  EXTREMITIES: No pedal edema, cyanosis, or clubbing.  NEUROLOGIC: Cranial nerves II through XII are intact. Muscle strength 5/5 in all extremities. Sensation intact. Gait not checked.  PSYCHIATRIC: The patient is alert and oriented x 3.  SKIN: No obvious rash, lesion, or ulcer.  DATA REVIEW:   CBC Recent Labs  Lab 10/08/19 0558  10/09/19 1245  WBC 9.7  --   --    HGB 9.3*   < > 9.4*  HCT 31.2*   < > 32.4*  PLT 279  --   --    < > = values in this interval not displayed.    Chemistries  Recent Labs  Lab 10/06/19 1745  10/08/19 0558  NA  --    < > 139  K  --    < > 3.2*  CL  --    < > 102  CO2  --    < > 27  GLUCOSE  --    < > 82  BUN  --    < > 6*  CREATININE  --    < > 0.74  CALCIUM  --    < > 9.3  MG 1.8  --   --    < > = values in this interval not displayed.     Follow-up Information    Schedule an appointment as soon as possible for a visit with [place].   Why: Please schedule hospital follow up appt within 5-7 days of discharge. Contact information: [address] [place] [address] [phone]        Midge Minium, MD. Schedule an appointment as soon as possible for a visit in 2 weeks.   Specialty: Gastroenterology Contact information: [address] [place] [place]  [place] [address] [phone]           Management plans discussed with the patient, family and they are in agreement.  CODE STATUS: Prior   TOTAL TIME TAKING CARE OF THIS PATIENT: 45 minutes.    Delfino Lovett M.D on 10/11/2019 at 8:35 PM  Between 7am to 6pm - Pager - [phone]  After 6pm go to [email or web address] - password [email or web address]  [facility]   CC: Primary care physician; [place]   Note: This dictation was prepared with Dragon dictation along with smaller phrase technology. Any transcriptional errors that result from this process are unintentional.

## 2019-10-12 ENCOUNTER — Encounter: Payer: Self-pay | Admitting: Gastroenterology

## 2019-10-13 ENCOUNTER — Encounter: Payer: Self-pay | Admitting: Gastroenterology

## 2019-11-08 ENCOUNTER — Ambulatory Visit: Payer: Medicare Other | Admitting: Gastroenterology

## 2019-11-08 ENCOUNTER — Encounter: Payer: Self-pay | Admitting: Gastroenterology

## 2019-11-12 ENCOUNTER — Inpatient Hospital Stay
Admission: EM | Admit: 2019-11-12 | Discharge: 2019-11-14 | DRG: 536 | Disposition: A | Discharge status: Home Health | Payer: Medicare Other | Attending: Family Medicine | Admitting: Family Medicine

## 2019-11-12 DIAGNOSIS — I5189 Other ill-defined heart diseases: Secondary | ICD-10-CM

## 2019-11-12 DIAGNOSIS — E785 Hyperlipidemia, unspecified: Secondary | ICD-10-CM | POA: Diagnosis present

## 2019-11-12 DIAGNOSIS — Z20828 Contact with and (suspected) exposure to other viral communicable diseases: Secondary | ICD-10-CM | POA: Diagnosis present

## 2019-11-12 DIAGNOSIS — W010XXA Fall on same level from slipping, tripping and stumbling without subsequent striking against object, initial encounter: Secondary | ICD-10-CM | POA: Diagnosis present

## 2019-11-12 DIAGNOSIS — S72114A Nondisplaced fracture of greater trochanter of right femur, initial encounter for closed fracture: Principal | ICD-10-CM | POA: Diagnosis present

## 2019-11-12 DIAGNOSIS — K219 Gastro-esophageal reflux disease without esophagitis: Secondary | ICD-10-CM | POA: Diagnosis present

## 2019-11-12 DIAGNOSIS — F17210 Nicotine dependence, cigarettes, uncomplicated: Secondary | ICD-10-CM | POA: Diagnosis present

## 2019-11-12 DIAGNOSIS — F329 Major depressive disorder, single episode, unspecified: Secondary | ICD-10-CM | POA: Diagnosis present

## 2019-11-12 DIAGNOSIS — I739 Peripheral vascular disease, unspecified: Secondary | ICD-10-CM | POA: Diagnosis present

## 2019-11-12 DIAGNOSIS — Z79899 Other long term (current) drug therapy: Secondary | ICD-10-CM

## 2019-11-12 DIAGNOSIS — S72101A Unspecified trochanteric fracture of right femur, initial encounter for closed fracture: Secondary | ICD-10-CM | POA: Diagnosis present

## 2019-11-12 DIAGNOSIS — F13239 Sedative, hypnotic or anxiolytic dependence with withdrawal, unspecified: Secondary | ICD-10-CM | POA: Diagnosis present

## 2019-11-12 DIAGNOSIS — F419 Anxiety disorder, unspecified: Secondary | ICD-10-CM | POA: Diagnosis present

## 2019-11-12 DIAGNOSIS — Z888 Allergy status to other drugs, medicaments and biological substances status: Secondary | ICD-10-CM

## 2019-11-12 DIAGNOSIS — I5032 Chronic diastolic (congestive) heart failure: Secondary | ICD-10-CM | POA: Diagnosis present

## 2019-11-12 DIAGNOSIS — F32A Depression, unspecified: Secondary | ICD-10-CM | POA: Diagnosis present

## 2019-11-12 DIAGNOSIS — F410 Panic disorder [episodic paroxysmal anxiety] without agoraphobia: Secondary | ICD-10-CM | POA: Diagnosis present

## 2019-11-12 DIAGNOSIS — I2510 Atherosclerotic heart disease of native coronary artery without angina pectoris: Secondary | ICD-10-CM | POA: Diagnosis present

## 2019-11-12 DIAGNOSIS — I10 Essential (primary) hypertension: Secondary | ICD-10-CM | POA: Diagnosis present

## 2019-11-12 DIAGNOSIS — J449 Chronic obstructive pulmonary disease, unspecified: Secondary | ICD-10-CM | POA: Diagnosis present

## 2019-11-12 DIAGNOSIS — Z8249 Family history of ischemic heart disease and other diseases of the circulatory system: Secondary | ICD-10-CM

## 2019-11-12 DIAGNOSIS — Z66 Do not resuscitate: Secondary | ICD-10-CM | POA: Diagnosis present

## 2019-11-12 DIAGNOSIS — Z8673 Personal history of transient ischemic attack (TIA), and cerebral infarction without residual deficits: Secondary | ICD-10-CM

## 2019-11-12 DIAGNOSIS — I252 Old myocardial infarction: Secondary | ICD-10-CM

## 2019-11-12 DIAGNOSIS — G629 Polyneuropathy, unspecified: Secondary | ICD-10-CM | POA: Diagnosis present

## 2019-11-12 DIAGNOSIS — W19XXXA Unspecified fall, initial encounter: Secondary | ICD-10-CM

## 2019-11-12 MED ORDER — ONDANSETRON HCL 4 MG/2ML IJ SOLN
4.0000 mg | Freq: Once | INTRAMUSCULAR | Status: AC
  Administered 2019-11-13: 4 mg via INTRAVENOUS
  Filled 2019-11-12: qty 2

## 2019-11-12 MED ORDER — FENTANYL CITRATE (PF) 100 MCG/2ML IJ SOLN
50.0000 ug | Freq: Once | INTRAMUSCULAR | Status: AC
  Administered 2019-11-13: 50 ug via INTRAVENOUS
  Filled 2019-11-12: qty 2

## 2019-11-12 MED ORDER — ALPRAZOLAM 0.5 MG PO TABS
2.0000 mg | ORAL_TABLET | Freq: Once | ORAL | Status: AC
  Administered 2019-11-13: 2 mg via ORAL
  Filled 2019-11-12: qty 4

## 2019-11-12 NOTE — ED Triage Notes (Addendum)
Per EMS report, patient tripped over her dog today at 1730 today and fell. Patient states she had to crawl to the couch. Patient c/o right upper leg pain and can't bear weight. Patient also c/o palpitations today and had a recent MI.

## 2019-11-13 ENCOUNTER — Emergency Department: Payer: Medicare Other

## 2019-11-13 ENCOUNTER — Encounter: Payer: Self-pay | Admitting: *Deleted

## 2019-11-13 ENCOUNTER — Other Ambulatory Visit: Payer: Self-pay

## 2019-11-13 DIAGNOSIS — F329 Major depressive disorder, single episode, unspecified: Secondary | ICD-10-CM | POA: Diagnosis present

## 2019-11-13 DIAGNOSIS — I2510 Atherosclerotic heart disease of native coronary artery without angina pectoris: Secondary | ICD-10-CM | POA: Diagnosis present

## 2019-11-13 DIAGNOSIS — K219 Gastro-esophageal reflux disease without esophagitis: Secondary | ICD-10-CM

## 2019-11-13 DIAGNOSIS — J432 Centrilobular emphysema: Secondary | ICD-10-CM | POA: Diagnosis not present

## 2019-11-13 DIAGNOSIS — F419 Anxiety disorder, unspecified: Secondary | ICD-10-CM | POA: Diagnosis not present

## 2019-11-13 DIAGNOSIS — I5032 Chronic diastolic (congestive) heart failure: Secondary | ICD-10-CM | POA: Diagnosis present

## 2019-11-13 DIAGNOSIS — F13239 Sedative, hypnotic or anxiolytic dependence with withdrawal, unspecified: Secondary | ICD-10-CM | POA: Diagnosis present

## 2019-11-13 DIAGNOSIS — S72101A Unspecified trochanteric fracture of right femur, initial encounter for closed fracture: Secondary | ICD-10-CM | POA: Diagnosis not present

## 2019-11-13 DIAGNOSIS — M792 Neuralgia and neuritis, unspecified: Secondary | ICD-10-CM

## 2019-11-13 DIAGNOSIS — W010XXA Fall on same level from slipping, tripping and stumbling without subsequent striking against object, initial encounter: Secondary | ICD-10-CM | POA: Diagnosis present

## 2019-11-13 DIAGNOSIS — E785 Hyperlipidemia, unspecified: Secondary | ICD-10-CM | POA: Diagnosis present

## 2019-11-13 DIAGNOSIS — Z79899 Other long term (current) drug therapy: Secondary | ICD-10-CM | POA: Diagnosis not present

## 2019-11-13 DIAGNOSIS — I252 Old myocardial infarction: Secondary | ICD-10-CM | POA: Diagnosis not present

## 2019-11-13 DIAGNOSIS — Z66 Do not resuscitate: Secondary | ICD-10-CM | POA: Diagnosis present

## 2019-11-13 DIAGNOSIS — G629 Polyneuropathy, unspecified: Secondary | ICD-10-CM | POA: Diagnosis present

## 2019-11-13 DIAGNOSIS — Z8249 Family history of ischemic heart disease and other diseases of the circulatory system: Secondary | ICD-10-CM | POA: Diagnosis not present

## 2019-11-13 DIAGNOSIS — S72114A Nondisplaced fracture of greater trochanter of right femur, initial encounter for closed fracture: Secondary | ICD-10-CM | POA: Diagnosis present

## 2019-11-13 DIAGNOSIS — J449 Chronic obstructive pulmonary disease, unspecified: Secondary | ICD-10-CM | POA: Diagnosis present

## 2019-11-13 DIAGNOSIS — F410 Panic disorder [episodic paroxysmal anxiety] without agoraphobia: Secondary | ICD-10-CM | POA: Diagnosis present

## 2019-11-13 DIAGNOSIS — F17210 Nicotine dependence, cigarettes, uncomplicated: Secondary | ICD-10-CM | POA: Diagnosis present

## 2019-11-13 DIAGNOSIS — I10 Essential (primary) hypertension: Secondary | ICD-10-CM | POA: Diagnosis not present

## 2019-11-13 DIAGNOSIS — Z888 Allergy status to other drugs, medicaments and biological substances status: Secondary | ICD-10-CM | POA: Diagnosis not present

## 2019-11-13 DIAGNOSIS — Z20828 Contact with and (suspected) exposure to other viral communicable diseases: Secondary | ICD-10-CM | POA: Diagnosis present

## 2019-11-13 DIAGNOSIS — Z8673 Personal history of transient ischemic attack (TIA), and cerebral infarction without residual deficits: Secondary | ICD-10-CM | POA: Diagnosis not present

## 2019-11-13 DIAGNOSIS — I739 Peripheral vascular disease, unspecified: Secondary | ICD-10-CM | POA: Diagnosis present

## 2019-11-13 LAB — COMPREHENSIVE METABOLIC PANEL
ALT: 29 U/L (ref 0–44)
AST: 30 U/L (ref 15–41)
Albumin: 4.2 g/dL (ref 3.5–5.0)
Alkaline Phosphatase: 99 U/L (ref 38–126)
Anion gap: 11 (ref 5–15)
BUN: 12 mg/dL (ref 8–23)
CO2: 25 mmol/L (ref 22–32)
Calcium: 9.7 mg/dL (ref 8.9–10.3)
Chloride: 100 mmol/L (ref 98–111)
Creatinine, Ser: 0.95 mg/dL (ref 0.44–1.00)
GFR calc Af Amer: >60 mL/min (ref >60)
GFR calc non Af Amer: >60 mL/min (ref >60)
Glucose, Bld: 134 mg/dL — ABNORMAL HIGH (ref 70–99)
Potassium: 3.7 mmol/L (ref 3.5–5.1)
Sodium: 136 mmol/L (ref 135–145)
Total Bilirubin: 0.5 mg/dL (ref 0.3–1.2)
Total Protein: 7.6 g/dL (ref 6.5–8.1)

## 2019-11-13 LAB — CBC WITH DIFFERENTIAL/PLATELET
Abs Immature Granulocytes: 0.06 10*3/uL (ref 0.00–0.07)
Basophils Absolute: 0.1 10*3/uL (ref 0.0–0.1)
Basophils Relative: 0 %
Eosinophils Absolute: 0.2 10*3/uL (ref 0.0–0.5)
Eosinophils Relative: 1 %
HCT: 37.7 % (ref 36.0–46.0)
Hemoglobin: 12 g/dL (ref 12.0–15.0)
Immature Granulocytes: 0 %
Lymphocytes Relative: 11 %
Lymphs Abs: 2 10*3/uL (ref 0.7–4.0)
MCH: 25.5 pg — ABNORMAL LOW (ref 26.0–34.0)
MCHC: 31.8 g/dL (ref 30.0–36.0)
MCV: 80.2 fL (ref 80.0–100.0)
Monocytes Absolute: 1.2 10*3/uL — ABNORMAL HIGH (ref 0.1–1.0)
Monocytes Relative: 7 %
Neutro Abs: 14.3 10*3/uL — ABNORMAL HIGH (ref 1.7–7.7)
Neutrophils Relative %: 81 %
Platelets: 259 10*3/uL (ref 150–400)
RBC: 4.7 MIL/uL (ref 3.87–5.11)
WBC: 17.9 10*3/uL — ABNORMAL HIGH (ref 4.0–10.5)
nRBC: 0 % (ref 0.0–0.2)

## 2019-11-13 LAB — URINALYSIS, COMPLETE (UACMP) WITH MICROSCOPIC
Bacteria, UA: NONE SEEN
Bilirubin Urine: NEGATIVE
Glucose, UA: NEGATIVE mg/dL
Hgb urine dipstick: NEGATIVE
Ketones, ur: NEGATIVE mg/dL
Leukocytes,Ua: NEGATIVE
Nitrite: NEGATIVE
Protein, ur: NEGATIVE mg/dL
Specific Gravity, Urine: 1.008 (ref 1.005–1.030)
pH: 6 (ref 5.0–8.0)

## 2019-11-13 LAB — TROPONIN I (HIGH SENSITIVITY): Troponin I (High Sensitivity): 5 ng/L (ref <18)

## 2019-11-13 LAB — SARS CORONAVIRUS 2 (TAT 6-24 HRS): SARS Coronavirus 2: NEGATIVE

## 2019-11-13 MED ORDER — DULOXETINE HCL 30 MG PO CPEP
30.0000 mg | ORAL_CAPSULE | Freq: Every day | ORAL | Status: DC
  Administered 2019-11-13 – 2019-11-14 (×2): 30 mg via ORAL
  Filled 2019-11-13 (×3): qty 1

## 2019-11-13 MED ORDER — OXYCODONE HCL 5 MG PO TABS
10.0000 mg | ORAL_TABLET | ORAL | Status: DC | PRN
  Administered 2019-11-13 – 2019-11-14 (×3): 10 mg via ORAL
  Filled 2019-11-13 (×3): qty 2

## 2019-11-13 MED ORDER — PANTOPRAZOLE SODIUM 40 MG PO TBEC
40.0000 mg | DELAYED_RELEASE_TABLET | Freq: Two times a day (BID) | ORAL | Status: DC
  Administered 2019-11-13 – 2019-11-14 (×3): 40 mg via ORAL
  Filled 2019-11-13 (×3): qty 1

## 2019-11-13 MED ORDER — LISINOPRIL 20 MG PO TABS
20.0000 mg | ORAL_TABLET | Freq: Every day | ORAL | Status: DC
  Administered 2019-11-14: 20 mg via ORAL
  Filled 2019-11-13: qty 1

## 2019-11-13 MED ORDER — GABAPENTIN 300 MG PO CAPS
300.0000 mg | ORAL_CAPSULE | Freq: Two times a day (BID) | ORAL | Status: DC
  Administered 2019-11-13 – 2019-11-14 (×3): 300 mg via ORAL
  Filled 2019-11-13 (×3): qty 1

## 2019-11-13 MED ORDER — MOMETASONE FURO-FORMOTEROL FUM 100-5 MCG/ACT IN AERO
2.0000 | INHALATION_SPRAY | Freq: Two times a day (BID) | RESPIRATORY_TRACT | Status: DC
  Administered 2019-11-13 – 2019-11-14 (×3): 2 via RESPIRATORY_TRACT
  Filled 2019-11-13: qty 8.8

## 2019-11-13 MED ORDER — ENOXAPARIN SODIUM 40 MG/0.4ML ~~LOC~~ SOLN
40.0000 mg | SUBCUTANEOUS | Status: DC
  Administered 2019-11-13 – 2019-11-14 (×2): 40 mg via SUBCUTANEOUS
  Filled 2019-11-13 (×2): qty 0.4

## 2019-11-13 MED ORDER — MORPHINE SULFATE (PF) 2 MG/ML IV SOLN
1.0000 mg | INTRAVENOUS | Status: DC | PRN
  Administered 2019-11-13: 1 mg via INTRAVENOUS
  Filled 2019-11-13: qty 1

## 2019-11-13 MED ORDER — OXYCODONE HCL 5 MG PO TABS
5.0000 mg | ORAL_TABLET | Freq: Four times a day (QID) | ORAL | Status: DC | PRN
  Administered 2019-11-13: 5 mg via ORAL
  Filled 2019-11-13: qty 1

## 2019-11-13 MED ORDER — ACETAMINOPHEN 500 MG PO TABS
1000.0000 mg | ORAL_TABLET | Freq: Once | ORAL | Status: AC
  Administered 2019-11-13: 1000 mg via ORAL
  Filled 2019-11-13: qty 2

## 2019-11-13 MED ORDER — HYDROCODONE-ACETAMINOPHEN 5-325 MG PO TABS: 1.0000 | ORAL_TABLET | ORAL | Status: DC | PRN

## 2019-11-13 MED ORDER — ACETAMINOPHEN 325 MG PO TABS: 650.0000 mg | ORAL_TABLET | Freq: Four times a day (QID) | ORAL | Status: DC | PRN

## 2019-11-13 MED ORDER — CARVEDILOL 3.125 MG PO TABS
6.2500 mg | ORAL_TABLET | Freq: Two times a day (BID) | ORAL | Status: DC
  Administered 2019-11-13 – 2019-11-14 (×3): 6.25 mg via ORAL
  Filled 2019-11-13 (×3): qty 2

## 2019-11-13 MED ORDER — CITALOPRAM HYDROBROMIDE 20 MG PO TABS
40.0000 mg | ORAL_TABLET | Freq: Every day | ORAL | Status: DC
  Administered 2019-11-13 – 2019-11-14 (×2): 40 mg via ORAL
  Filled 2019-11-13 (×2): qty 2

## 2019-11-13 MED ORDER — FUROSEMIDE 40 MG PO TABS
40.0000 mg | ORAL_TABLET | Freq: Every day | ORAL | Status: DC
  Administered 2019-11-14: 40 mg via ORAL
  Filled 2019-11-13: qty 1

## 2019-11-13 MED ORDER — ACETAMINOPHEN 500 MG PO TABS
1000.0000 mg | ORAL_TABLET | Freq: Three times a day (TID) | ORAL | Status: DC
  Administered 2019-11-13 – 2019-11-14 (×4): 1000 mg via ORAL
  Filled 2019-11-13 (×4): qty 2

## 2019-11-13 MED ORDER — ALPRAZOLAM 0.5 MG PO TABS
2.0000 mg | ORAL_TABLET | Freq: Two times a day (BID) | ORAL | Status: DC
  Administered 2019-11-13 – 2019-11-14 (×3): 2 mg via ORAL
  Filled 2019-11-13 (×3): qty 4

## 2019-11-13 MED ORDER — BUSPIRONE HCL 10 MG PO TABS
10.0000 mg | ORAL_TABLET | Freq: Two times a day (BID) | ORAL | Status: DC
  Administered 2019-11-13 – 2019-11-14 (×3): 10 mg via ORAL
  Filled 2019-11-13 (×3): qty 1

## 2019-11-13 MED ORDER — SENNA 8.6 MG PO TABS
1.0000 | ORAL_TABLET | Freq: Two times a day (BID) | ORAL | Status: DC
  Administered 2019-11-13 – 2019-11-14 (×3): 8.6 mg via ORAL
  Filled 2019-11-13 (×3): qty 1

## 2019-11-13 MED ORDER — ATORVASTATIN CALCIUM 20 MG PO TABS
80.0000 mg | ORAL_TABLET | Freq: Every day | ORAL | Status: DC
  Administered 2019-11-13 – 2019-11-14 (×2): 80 mg via ORAL
  Filled 2019-11-13 (×2): qty 4

## 2019-11-13 MED ORDER — OXYCODONE HCL 5 MG PO TABS
5.0000 mg | ORAL_TABLET | Freq: Once | ORAL | Status: AC
  Administered 2019-11-13: 5 mg via ORAL
  Filled 2019-11-13: qty 1

## 2019-11-13 NOTE — Evaluation (Signed)
Physical Therapy Evaluation Patient Details Name: Traci Duran MRN: [id number] DOB: 07-04-1956 Today's Date: 11/13/2019   History of Present Illness  Patient is a 63 year old female with non-surgical comminuted minimally displaced fracture of the greater trochanteric region of the right hip. Patient endorses fall while letting out her mid sized dog, and was unable to ambulate following. LIves with grandson who is rarely home d/t work. PMH: CVA, CAD, dCHF, COPD not on O2, HTN and anxiety on daily Xanax  Clinical Impression  Pt with impairments in R hip pain, decreased strength, balance and motion; inhibiting normalized gait, OOB tolerance, IND ambulation and transfers, inhibiting full participation in safe and ind ADLs. Patient was pleasantly surprised that she was able to manage supine > sit modI, STS supervision with RW, and ambulation supervision with RW. Patient with cues needed throughout session for RW safety with transfers and ambulation, but is able to comply with all cuing for safety and increased independence. Patient insists on wanting to go home, and feels more confident knowing she can walk over household distances with RW. To D/C home safely, patient will need RW, HHPT for balance deficits, and intermittent supervision. Would benefit from skilled PT to address above deficits and promote optimal return to PLOF.     Follow Up Recommendations Home health PT;Supervision - Intermittent    Equipment Recommendations  Rolling walker with 5" wheels;3in1 (PT)    Recommendations for Other Services       Precautions / Restrictions Restrictions Weight Bearing Restrictions: No      Mobility  Bed Mobility Overal bed mobility: Modified Independent             General bed mobility comments: increased time needed  Transfers Overall transfer level: Needs assistance Equipment used: Rolling walker (2 wheeled) Transfers: Sit to/from Stand Sit to Stand: Modified independent  (Device/Increase time);Supervision         General transfer comment: CUing for set up/hand placement with excellent carry over  Ambulation/Gait Ambulation/Gait assistance: Supervision;Modified independent (Device/Increase time) Gait Distance (Feet): 100 Feet Assistive device: Rolling walker (2 wheeled) Gait Pattern/deviations: Step-to pattern Gait velocity: decreased   General Gait Details: decreased RLE stance time d/t pain, decreased LLE step length. Increased pain and fatigue following  Stairs            Wheelchair Mobility    Modified Rankin (Stroke Patients Only)       Balance Overall balance assessment: Mild deficits observed, not formally tested                                           Pertinent Vitals/Pain Pain Assessment: 0-10 Pain Score: 10-Worst pain ever Pain Location: R hip following ambulation 10/10; prior reports 7/10 Pain Descriptors / Indicators: Aching Pain Intervention(s): Limited activity within patient's tolerance    Home Living Family/patient expects to be discharged to:: Private residence Living Arrangements: Other relatives Available Help at Discharge: Family;Available PRN/intermittently Type of Home: House Home Access: Stairs to enter Entrance Stairs-Rails: None Entrance Stairs-Number of Steps: 1 Home Layout: One level Home Equipment: None      Prior Function Level of Independence: Independent         Comments: no recent falls per pt,drives self, completes all cooking/cleaning     Hand Dominance   Dominant Hand: Right    Extremity/Trunk Assessment   Upper Extremity Assessment Upper Extremity Assessment: Overall [facility]  for tasks assessed    Lower Extremity Assessment Lower Extremity Assessment: Overall WFL for tasks assessed    Cervical / Trunk Assessment Cervical / Trunk Assessment: Normal  Communication   Communication: No difficulties  Cognition Arousal/Alertness: Awake/alert Behavior During  Therapy: WFL for tasks assessed/performed Overall Cognitive Status: Within Functional Limits for tasks assessed                                        General Comments      Exercises Other Exercises Other Exercises: Supine > sit: cuing from PT for efficiency, good carry over able to complete without physical assistance Other Exercises: STS cuing for handplacement/set up with good carry over, able to stand modI Other Exercises: AMB with RW over 117ft with cuing for normalized gait, able to complete 50% with decreased RLE stance and WB, decreased LLE step length. GOod carry over of education on RW management for safety.   Assessment/Plan    PT Assessment Patient needs continued PT services  PT Problem List Decreased strength;Decreased mobility;Decreased range of motion;Decreased coordination;Decreased activity tolerance;Decreased balance;Pain;Decreased knowledge of use of DME       PT Treatment Interventions DME instruction;Therapeutic activities;Gait training;Therapeutic exercise;Stair training;Balance training;Functional mobility training;Neuromuscular re-education;Manual techniques    PT Goals (Current goals can be found in the Care Plan section)  Acute Rehab PT Goals Patient Stated Goal: go home PT Goal Formulation: With patient Time For Goal Achievement: 11/27/19 Potential to Achieve Goals: Fair    Frequency 7X/week   Barriers to discharge Decreased caregiver support      Co-evaluation               AM-PAC PT "6 Clicks" Mobility  Outcome Measure Help needed turning from your back to your side while in a flat bed without using bedrails?: A Little Help needed moving from lying on your back to sitting on the side of a flat bed without using bedrails?: A Little Help needed moving to and from a bed to a chair (including a wheelchair)?: A Little Help needed standing up from a chair using your arms (e.g., wheelchair or bedside chair)?: A Little Help  needed to walk in hospital room?: A Little Help needed climbing 3-5 steps with a railing? : A Lot 6 Click Score: 17    End of Session Equipment Utilized During Treatment: Gait belt Activity Tolerance: Patient tolerated treatment well;Patient limited by pain Patient left: in chair;with chair alarm set;with call bell/phone within reach Nurse Communication: Mobility status PT Visit Diagnosis: Unsteadiness on feet (R26.81);Muscle weakness (generalized) (M62.81);History of falling (Z91.81);Difficulty in walking, not elsewhere classified (R26.2);Pain Pain - Right/Left: Right Pain - part of body: Hip    Time: 0220-0236 PT Time Calculation (min) (ACUTE ONLY): 16 min   Charges:   PT Evaluation $PT Eval Moderate Complexity: 1 Mod PT Treatments $Therapeutic Activity: 8-22 mins        Staci Acosta PT, DPT  Staci Acosta 11/13/2019, 2:55 PM

## 2019-11-13 NOTE — Progress Notes (Signed)
PROGRESS NOTE    Traci Duran  ZOX:[id number] DOB: [birth year] DOA: 11/12/2019 PCP: [name]      Brief Narrative:  Mrs. Kendricks is a 63 y.o. F with hx CVA, CAD, dCHF, COPD not on O2, HTN and anxiety on daily Xanax who presented for a fall.  Patient was letting her dog out today and turned and tripped on her dog falling onto her right hip.  It took her about 15 minutes to crawl up to the couch and call for help.  Had run out of Xanax 2 days prior, and was feeling withdrawals because her refill had not arrived in mail yet.  In the ER, afebrile, normotensive.  CT of the hip showed a greater trochanter fracture. Case discussed with orthopedic Dr. Hyacinth Meeker who recommended non-operative mgmt and pain control.  Patient unable to stand and so hospitalist service asked to observe for PT evaluation   Assessment & Plan:  RIGHT femur greater trochanter fracture  Leukocytosis -Likely reactive.  No signs of infection  Hypertension -Continue lisinopril  Chronic diastolic heart failure -Continue Lasix, Coreg  COPD -Continue bronchodilator -Continue symbicort  Depression/anxiety -Continue Celexa, duloxetine, BuSpar and Xanax  Neuropathic pain of bilateral lower extremity -Continue gabapentin  Hyperlipidemia -Continue atorvastatin  GERD -Continue pantop      DVT prophylaxis: Lovenox Code Status: FULL Family Communication:  MDM and disposition Plan: This is a no charge note.  For further details, please see H&P by my partner Dr. Cyndia Bent from earlier today.  The below labs and imaging reports were reviewed and summarized above.    The patient was admitted with trochanter fracture.    Objective: Vitals:   11/13/19 0530 11/13/19 0639 11/13/19 0754 11/13/19 0950  BP: 140/81 124/78 98/85 105/63  Pulse: 70 65 65 73  Resp: 19 16    Temp:  98 F (36.7 C) 98.4 F (36.9 C)   TempSrc:  Oral Oral   SpO2: 95% 96% 94% 96%  Weight:      Height:         Intake/Output Summary (Last 24 hours) at 11/13/2019 1156 Last data filed at 11/13/2019 0900 Gross per 24 hour  Intake 480 ml  Output -  Net 480 ml   Filed Weights   11/12/19 2353  Weight: 63 kg    Examination: The patient was seen and examined.      Data Reviewed: I have personally reviewed following labs and imaging studies:  CBC: Recent Labs  Lab 11/13/19 0019  WBC 17.9*  NEUTROABS 14.3*  HGB 12.0  HCT 37.7  MCV 80.2  PLT 259   Basic Metabolic Panel: Recent Labs  Lab 11/13/19 0019  NA 136  K 3.7  CL 100  CO2 25  GLUCOSE 134*  BUN 12  CREATININE 0.95  CALCIUM 9.7   GFR: Estimated Creatinine Clearance: 58.9 mL/min (by C-G formula based on SCr of 0.95 mg/dL). Liver Function Tests: Recent Labs  Lab 11/13/19 0019  AST 30  ALT 29  ALKPHOS 99  BILITOT 0.5  PROT 7.6  ALBUMIN 4.2   No results for input(s): LIPASE, AMYLASE in the last 168 hours. No results for input(s): AMMONIA in the last 168 hours. Coagulation Profile: No results for input(s): INR, PROTIME in the last 168 hours. Cardiac Enzymes: No results for input(s): CKTOTAL, CKMB, CKMBINDEX, TROPONINI in the last 168 hours. BNP (last 3 results) No results for input(s): PROBNP in the last 8760 hours. HbA1C: No results for input(s): HGBA1C in the last  72 hours. CBG: No results for input(s): GLUCAP in the last 168 hours. Lipid Profile: No results for input(s): CHOL, HDL, LDLCALC, TRIG, CHOLHDL, LDLDIRECT in the last 72 hours. Thyroid Function Tests: No results for input(s): TSH, T4TOTAL, FREET4, T3FREE, THYROIDAB in the last 72 hours. Anemia Panel: No results for input(s): VITAMINB12, FOLATE, FERRITIN, TIBC, IRON, RETICCTPCT in the last 72 hours. Urine analysis:    Component Value Date/Time   COLORURINE YELLOW (A) 11/13/2019 0242   APPEARANCEUR CLEAR (A) 11/13/2019 0242   LABSPEC 1.008 11/13/2019 0242   PHURINE 6.0 11/13/2019 0242   GLUCOSEU NEGATIVE 11/13/2019 0242   HGBUR NEGATIVE  11/13/2019 0242   BILIRUBINUR NEGATIVE 11/13/2019 0242   KETONESUR NEGATIVE 11/13/2019 0242   PROTEINUR NEGATIVE 11/13/2019 0242   UROBILINOGEN 0.2 10/15/2011 0840   NITRITE NEGATIVE 11/13/2019 0242   LEUKOCYTESUR NEGATIVE 11/13/2019 0242   Sepsis Labs: @LABRCNTIP (procalcitonin:4,lacticacidven:4)  )No results found for this or any previous visit (from the past 240 hour(s)).       Radiology Studies: Ct Pelvis Wo Contrast  Result Date: 11/13/2019 CLINICAL DATA:  Pain after a fall EXAM: CT PELVIS WITHOUT CONTRAST TECHNIQUE: Multidetector CT imaging of the pelvis was performed following the standard protocol without intravenous contrast. COMPARISON:  Radiograph same day FINDINGS: Urinary Tract: The visualized distal ureters and bladder appear unremarkable. Bowel: No bowel wall thickening, distention or surrounding inflammation identified within the pelvis. Scattered colonic diverticula are noted. Vascular/Lymphatic: No enlarged pelvic lymph nodes identified. Scattered aortic atherosclerotic calcifications are seen without aneurysmal dilatation. Reproductive: The uterus and adnexa are unremarkable. Other: Mild soft tissue swelling seen over the right lateral hip Musculoskeletal: There is a comminuted mildly impacted fracture involving the right greater trochanter. No other fracture is seen. The femoral head is still well seated within the acetabulum. The patient is status post ORIF with IM nail fixation of the left femur. No periprosthetic lucency or fracture is seen. Sclerosis around bilateral sacroiliac joints seen. Mild degenerative changes in the lower lumbar spine. There is mild fatty atrophy of the muscles surrounding the pelvis. A small right hip joint effusion is seen. The tendons appear to be grossly intact. IMPRESSION: 1. Comminuted mildly impacted right greater trochanter fracture. 2. Status post right IM nail fixation without hardware complication Electronically Signed   By: Jonna Clark  M.D.   On: 11/13/2019 01:40   Dg Chest Portable 1 View  Result Date: 11/13/2019 CLINICAL DATA:  Leukocytosis. EXAM: PORTABLE CHEST 1 VIEW COMPARISON:  October 06, 2019 FINDINGS: The heart size and mediastinal contours are within normal limits. Both lungs are clear. The visualized skeletal structures are unremarkable. IMPRESSION: No active disease. Electronically Signed   By: Gerome Sam III M.D   On: 11/13/2019 02:09   Dg Hip Unilat W Or Wo Pelvis 2-3 Views Right  Result Date: 11/13/2019 CLINICAL DATA:  Pain after fall EXAM: DG HIP (WITH OR WITHOUT PELVIS) 2-3V RIGHT COMPARISON:  None. FINDINGS: Gamma nails in a femoral rod extend through the proximal left femur. No pelvic bone fractures are noted. The right hip is intact. IMPRESSION: Negative. Electronically Signed   By: Gerome Sam III M.D   On: 11/13/2019 00:30        Scheduled Meds: . acetaminophen  1,000 mg Oral TID  . alprazolam  2 mg Oral BID  . atorvastatin  80 mg Oral Daily  . busPIRone  10 mg Oral BID  . carvedilol  6.25 mg Oral BID  . citalopram  40 mg Oral Daily  .  DULoxetine  30 mg Oral Daily  . enoxaparin (LOVENOX) injection  40 mg Subcutaneous Q24H  . furosemide  40 mg Oral Daily  . gabapentin  300 mg Oral BID  . lisinopril  20 mg Oral Daily  . mometasone-formoterol  2 puff Inhalation BID  . oxyCODONE  5 mg Oral Once  . pantoprazole  40 mg Oral BID AC  . senna  1 tablet Oral BID   Continuous Infusions:   LOS: 0 days    Time spent: 15 minutes    Alberteen Sam, MD [facility] 11/13/2019, 11:56 AM     Please page though AMION or Epic secure chat:  For password, contact charge nurse

## 2019-11-13 NOTE — H&P (Addendum)
History and Physical    Traci Duran ZOX:[id number] DOB: 09-Nov-1956 DOA: 11/12/2019  PCP: [name]  Patient coming from: Home, lives with grandson  I have personally briefly reviewed patient's old medical records in [facility]  Chief Complaint: Mechanical fall  HPI: Traci Duran is a 63 y.o. female with medical history significant of history of CVA, CAD, diastolic congestive heart failure, COPD, hypertension, hyperlipidemia and anxiety/depression who presents for a fall.  Patient was letting her dog out today and turned and tripped on her dog falling onto her right hip.  It took her about 15 minutes to crawl up to the couch and call for help.  She denies any dizziness or lightheadedness.  Denies any chest pain or shortness of breath.  She does not have frequent falls and is able to ambulate without any assistance at baseline.  Endorsed half a pack tobacco use.  Denies any tobacco or illicit drug use.  ED Course: She was afebrile and normotensive on room air.  CBC shows leukocytosis of 17.9 and normal hemoglobin of 12.  CMP otherwise unremarkable except for glucose of 134.  Troponin of 5.  Negative urinalysis.  Negative chest x-ray. She was given 50 mcg of fentanyl, Zofran and her home 2 mg Xanax. ED physician discussed case with orthopedic Dr. Hyacinth Meeker and her fracture is nonoperable.  Recommends admission for pain management and physical therapy evaluation.  Review of Systems:  Constitutional: No Weight Change, No Fever ENT/Mouth: No sore throat, No Rhinorrhea Eyes: No Eye Pain, No Vision Changes Cardiovascular: No Chest Pain, no SOB Respiratory: No Cough, No Sputum, No Wheezing, no Dyspnea  Gastrointestinal: No Nausea, No Vomiting, No Diarrhea,  Genitourinary: no Urinary Incontinence, No Urgency,  Musculoskeletal: No Arthralgias, No Myalgias Skin: No Skin Lesions, No Pruritus, Neuro: no Weakness, No Numbness,  No Loss of Consciousness, No Syncope Psych:  No Anxiety/Panic, No Depression, no decrease appetite Heme/Lymph: No Bruising, No Bleeding  Past Medical History:  Diagnosis Date  . Anemia   . Anxiety   . CHF (congestive heart failure) (HCC)   . COPD (chronic obstructive pulmonary disease) (HCC)   . Coronary artery disease   . Depression   . Gastritis   . GERD (gastroesophageal reflux disease)   . Hemorrhoids   . Hyperlipidemia   . Hypertension   . Myocardial infarction (HCC)   . PAD (peripheral artery disease) (HCC)     Past Surgical History:  Procedure Laterality Date  . APPENDECTOMY    . COLONOSCOPY WITH PROPOFOL N/A 06/09/2017   Procedure: COLONOSCOPY WITH PROPOFOL;  Surgeon: Midge Minium, MD;  Location: [facility] ENDOSCOPY;  Service: Endoscopy;  Laterality: N/A;  . COLONOSCOPY WITH PROPOFOL N/A 10/08/2019   Procedure: COLONOSCOPY WITH PROPOFOL;  Surgeon: Midge Minium, MD;  Location: [facility] ENDOSCOPY;  Service: Endoscopy;  Laterality: N/A;  . ENTEROSCOPY N/A 10/08/2019   Procedure: ENTEROSCOPY;  Surgeon: Midge Minium, MD;  Location: [facility] ENDOSCOPY;  Service: Endoscopy;  Laterality: N/A;  . ESOPHAGOGASTRODUODENOSCOPY N/A 07/30/2017   Procedure: ESOPHAGOGASTRODUODENOSCOPY (EGD);  Surgeon: Toney Reil, MD;  Location: [facility] ENDOSCOPY;  Service: Gastroenterology;  Laterality: N/A;  . ESOPHAGOGASTRODUODENOSCOPY (EGD) WITH PROPOFOL N/A 04/09/2017   Procedure: ESOPHAGOGASTRODUODENOSCOPY (EGD) WITH PROPOFOL;  Surgeon: Midge Minium, MD;  Location: [facility] ENDOSCOPY;  Service: Endoscopy;  Laterality: N/A;  . GIVENS CAPSULE STUDY N/A 10/08/2019   Procedure: GIVENS CAPSULE STUDY;  Surgeon: Midge Minium, MD;  Location: [facility] ENDOSCOPY;  Service: Endoscopy;  Laterality: N/A;  . HIP FRACTURE SURGERY  reports that she has been smoking cigarettes. She has a 46.00 pack-year smoking history. She has never used smokeless tobacco. She reports current drug use. Drug: Marijuana. She reports that she does not drink alcohol.  Allergies  Allergen  Reactions  . Chantix [Varenicline] Hives, Shortness Of Breath and Palpitations  . Diphenhydramine Hcl Shortness Of Breath  . Potassium Itching and Swelling  . Benadryl [Diphenhydramine Hcl] Rash  . Niacin Rash  . Trazodone Palpitations    Family History  Problem Relation Age of Onset  . Heart failure Mother   . Heart attack Mother        mother died at 7 of an MI  . Heart attack Father        father died at 26 of an MI  . Heart attack Brother        Brother had an MI in his 46s     Prior to Admission medications   Medication Sig Start Date End Date Taking? Authorizing Provider  acetaminophen (TYLENOL) 325 MG tablet Take 3 tablets (975 mg total) by mouth 3 (three) times daily as needed. 02/11/19  Yes Sharman Cheek, MD  albuterol (PROVENTIL) (2.5 MG/3ML) 0.083% nebulizer solution Take 3 mLs (2.5 mg total) by nebulization every 6 (six) hours as needed for wheezing or shortness of breath. 05/18/19  Yes Mayo, Allyn Kenner, MD  alprazolam Prudy Feeler) 2 MG tablet Take 2 mg by mouth 2 (two) times daily.    Yes [provider]  atorvastatin (LIPITOR) 80 MG tablet Take 80 mg by mouth daily.   Yes [provider]  busPIRone (BUSPAR) 10 MG tablet Take 10 mg by mouth 2 (two) times daily.    Yes [provider]  carvedilol (COREG) 6.25 MG tablet Take 6.25 mg by mouth 2 (two) times daily. 05/28/17  Yes [provider]  citalopram (CELEXA) 40 MG tablet Take 40 mg by mouth daily.   Yes [provider]  DULoxetine (CYMBALTA) 30 MG capsule Take 30 mg by mouth daily. 05/28/17  Yes [provider]  furosemide (LASIX) 40 MG tablet Take 1 tablet (40 mg total) by mouth daily. 05/19/19  Yes Jimmye Norman, NP  gabapentin (NEURONTIN) 300 MG capsule Take 300 mg by mouth 2 (two) times daily.    Yes [provider]  ipratropium-albuterol (DUONEB) 0.5-2.5 (3) MG/3ML SOLN Take 3 mLs by nebulization every 6 (six) hours as needed. 05/18/19  Yes Jimmye Norman, NP  lisinopril (PRINIVIL,ZESTRIL) 20 MG tablet Take 20 mg by mouth daily.     Yes [provider]  pantoprazole (PROTONIX) 40 MG tablet Take 1 tablet (40 mg total) by mouth 2 (two) times daily before a meal. 10/09/19  Yes Delfino Lovett, MD  SYMBICORT 80-4.5 MCG/ACT inhaler Inhale 2 puffs into the lungs 2 (two) times daily. 08/12/18  Yes [provider]  nitroGLYCERIN (NITROSTAT) 0.4 MG SL tablet Place 0.4 mg under the tongue as needed. 10/06/19   [provider]  pregabalin (LYRICA) 150 MG capsule Take 150 mg by mouth 2 (two) times daily. 11/18/18 11/18/19  [provider]  Vitamin D, Ergocalciferol, (DRISDOL) 50000 units CAPS capsule Take 50,000 Units by mouth every Saturday.  05/01/17   [provider]    Physical Exam: Vitals:   11/13/19 0000 11/13/19 0145 11/13/19 0200 11/13/19 0300  BP: (!) 187/89  102/66 100/63  Pulse: 72 73  65  Resp: 20 19 18 16   Temp:      TempSrc:  SpO2: 96% 95%  94%  Weight:      Height:        Constitutional: NAD, calm, comfortable female laying flat in bed asleep.  Awoke easily. Vitals:   11/13/19 0000 11/13/19 0145 11/13/19 0200 11/13/19 0300  BP: (!) 187/89  102/66 100/63  Pulse: 72 73  65  Resp: 20 19 18 16   Temp:      TempSrc:      SpO2: 96% 95%  94%  Weight:      Height:       Eyes:  lids and conjunctivae normal ENMT: Mucous membranes are moist.  Neck: normal, supple,  Respiratory: Decreased aeration throughout but no wheezing, no crackles. Normal respiratory effort. No accessory muscle use.  Cardiovascular: Regular rate and rhythm, no murmurs / rubs / gallops. No extremity edema.   Abdomen: no tenderness,  Bowel sounds positive.  Musculoskeletal: no clubbing / cyanosis. No joint deformity upper and lower extremities.  Normal muscle tone.  Skin: no rashes, lesions, ulcers. No induration Neurologic: CN 2-12 grossly intact. Sensation intact, Strength 5/5 in all extremities except  the right lower extremity given pain from fracture. Psychiatric: Normal judgment and insight. Alert and oriented x 3. Normal mood.     Labs on Admission: I have personally reviewed following labs and imaging studies  CBC: Recent Labs  Lab 11/13/19 0019  WBC 17.9*  NEUTROABS 14.3*  HGB 12.0  HCT 37.7  MCV 80.2  PLT 259   Basic Metabolic Panel: Recent Labs  Lab 11/13/19 0019  NA 136  K 3.7  CL 100  CO2 25  GLUCOSE 134*  BUN 12  CREATININE 0.95  CALCIUM 9.7   GFR: Estimated Creatinine Clearance: 58.9 mL/min (by C-G formula based on SCr of 0.95 mg/dL). Liver Function Tests: Recent Labs  Lab 11/13/19 0019  AST 30  ALT 29  ALKPHOS 99  BILITOT 0.5  PROT 7.6  ALBUMIN 4.2   No results for input(s): LIPASE, AMYLASE in the last 168 hours. No results for input(s): AMMONIA in the last 168 hours. Coagulation Profile: No results for input(s): INR, PROTIME in the last 168 hours. Cardiac Enzymes: No results for input(s): CKTOTAL, CKMB, CKMBINDEX, TROPONINI in the last 168 hours. BNP (last 3 results) No results for input(s): PROBNP in the last 8760 hours. HbA1C: No results for input(s): HGBA1C in the last 72 hours. CBG: No results for input(s): GLUCAP in the last 168 hours. Lipid Profile: No results for input(s): CHOL, HDL, LDLCALC, TRIG, CHOLHDL, LDLDIRECT in the last 72 hours. Thyroid Function Tests: No results for input(s): TSH, T4TOTAL, FREET4, T3FREE, THYROIDAB in the last 72 hours. Anemia Panel: No results for input(s): VITAMINB12, FOLATE, FERRITIN, TIBC, IRON, RETICCTPCT in the last 72 hours. Urine analysis:    Component Value Date/Time   COLORURINE YELLOW (A) 11/13/2019 0242   APPEARANCEUR CLEAR (A) 11/13/2019 0242   LABSPEC 1.008 11/13/2019 0242   PHURINE 6.0 11/13/2019 0242   GLUCOSEU NEGATIVE 11/13/2019 0242   HGBUR NEGATIVE 11/13/2019 0242   BILIRUBINUR NEGATIVE 11/13/2019 0242   KETONESUR NEGATIVE 11/13/2019 0242   PROTEINUR NEGATIVE 11/13/2019 0242    UROBILINOGEN 0.2 10/15/2011 0840   NITRITE NEGATIVE 11/13/2019 0242   LEUKOCYTESUR NEGATIVE 11/13/2019 0242    Radiological Exams on Admission: Ct Pelvis Wo Contrast  Result Date: 11/13/2019 CLINICAL DATA:  Pain after a fall EXAM: CT PELVIS WITHOUT CONTRAST TECHNIQUE: Multidetector CT imaging of the pelvis was performed following the standard protocol without intravenous contrast. COMPARISON:  Radiograph same day  FINDINGS: Urinary Tract: The visualized distal ureters and bladder appear unremarkable. Bowel: No bowel wall thickening, distention or surrounding inflammation identified within the pelvis. Scattered colonic diverticula are noted. Vascular/Lymphatic: No enlarged pelvic lymph nodes identified. Scattered aortic atherosclerotic calcifications are seen without aneurysmal dilatation. Reproductive: The uterus and adnexa are unremarkable. Other: Mild soft tissue swelling seen over the right lateral hip Musculoskeletal: There is a comminuted mildly impacted fracture involving the right greater trochanter. No other fracture is seen. The femoral head is still well seated within the acetabulum. The patient is status post ORIF with IM nail fixation of the left femur. No periprosthetic lucency or fracture is seen. Sclerosis around bilateral sacroiliac joints seen. Mild degenerative changes in the lower lumbar spine. There is mild fatty atrophy of the muscles surrounding the pelvis. A small right hip joint effusion is seen. The tendons appear to be grossly intact. IMPRESSION: 1. Comminuted mildly impacted right greater trochanter fracture. 2. Status post right IM nail fixation without hardware complication Electronically Signed   By: Jonna Clark M.D.   On: 11/13/2019 01:40   Dg Chest Portable 1 View  Result Date: 11/13/2019 CLINICAL DATA:  Leukocytosis. EXAM: PORTABLE CHEST 1 VIEW COMPARISON:  October 06, 2019 FINDINGS: The heart size and mediastinal contours are within normal limits. Both lungs are  clear. The visualized skeletal structures are unremarkable. IMPRESSION: No active disease. Electronically Signed   By: Gerome Sam III M.D   On: 11/13/2019 02:09   Dg Hip Unilat W Or Wo Pelvis 2-3 Views Right  Result Date: 11/13/2019 CLINICAL DATA:  Pain after fall EXAM: DG HIP (WITH OR WITHOUT PELVIS) 2-3V RIGHT COMPARISON:  None. FINDINGS: Gamma nails in a femoral rod extend through the proximal left femur. No pelvic bone fractures are noted. The right hip is intact. IMPRESSION: Negative. Electronically Signed   By: Gerome Sam III M.D   On: 11/13/2019 00:30    EKG: Independently reviewed.   Assessment/Plan  Comminuted mildly impacted fracture of the right greater trochanter -ED physician discussed case with orthopedic Dr. Hyacinth Meeker and her fracture is nonoperable.  -Pain management: Tylenol for mild pain, as needed hydrocodone for moderate pain, as needed morphine for severe pain -PT eval for placement or home PT- although patient wishes to return home  Leukocytosis -Likely reactive.  No signs of infection  Hypertension -Continue lisinopril  Chronic diastolic heart failure -Continue Lasix, Coreg  COPD -Continue bronchodilator  Depression/anxiety -Continue Celexa, duloxetine, BuSpar and Xanax  Neuropathic pain of bilateral lower extremity -Continue gabapentin  Hyperlipidemia -Continue statin  GERD -Continue PPI  DVT prophylaxis:.Lovenox Code Status:DNR Family Communication: Plan discussed with patient at bedside  disposition Plan: Home with at least 2 midnight stays  Consults called:  Admission status: inpatient    Barbarita Hutmacher T Ilene Witcher DO [facility]  If 7PM-7AM, please contact night-coverage [email or web address] Password [email or web address]  11/13/2019, 4:01 AM

## 2019-11-13 NOTE — Progress Notes (Signed)
Per report given by Ena Dawley, ED RN, pt has been swabbed for Covid. Awaiting result.

## 2019-11-13 NOTE — Consult Note (Signed)
ORTHOPAEDIC CONSULTATION  REQUESTING PHYSICIAN: Edwin Dada, *  Chief Complaint: Right hip pain  HPI: Traci Duran is a 63 y.o. female who complains of right hip pain after she fell while tripping over her dog at home yesterday.  She was brought to the emergency room where exam and x-rays including CT scan of the pelvis showed a comminuted minimally displaced fracture of the greater trochanteric region of the right hip.  He was unable to walk.  She was brought into the hospital for control and DT with possible placement.  Lives alone except for a grandson who is really there she says.  Past Medical History:  Diagnosis Date  . Anemia   . Anxiety   . CHF (congestive heart failure) ([place])   . COPD (chronic obstructive pulmonary disease) ([place])   . Coronary artery disease   . Depression   . Gastritis   . GERD (gastroesophageal reflux disease)   . Hemorrhoids   . Hyperlipidemia   . Hypertension   . Myocardial infarction ([place])   . PAD (peripheral artery disease) ([place])    Past Surgical History:  Procedure Laterality Date  . APPENDECTOMY    . COLONOSCOPY WITH PROPOFOL N/A 06/09/2017   Procedure: COLONOSCOPY WITH PROPOFOL;  Surgeon: Lucilla Lame, MD;  Location: [facility] ENDOSCOPY;  Service: Endoscopy;  Laterality: N/A;  . COLONOSCOPY WITH PROPOFOL N/A 10/08/2019   Procedure: COLONOSCOPY WITH PROPOFOL;  Surgeon: Lucilla Lame, MD;  Location: [facility] ENDOSCOPY;  Service: Endoscopy;  Laterality: N/A;  . ENTEROSCOPY N/A 10/08/2019   Procedure: ENTEROSCOPY;  Surgeon: Lucilla Lame, MD;  Location: [facility] ENDOSCOPY;  Service: Endoscopy;  Laterality: N/A;  . ESOPHAGOGASTRODUODENOSCOPY N/A 07/30/2017   Procedure: ESOPHAGOGASTRODUODENOSCOPY (EGD);  Surgeon: Lin Landsman, MD;  Location: [facility] ENDOSCOPY;  Service: Gastroenterology;  Laterality: N/A;  . ESOPHAGOGASTRODUODENOSCOPY (EGD) WITH PROPOFOL N/A 04/09/2017   Procedure: ESOPHAGOGASTRODUODENOSCOPY (EGD) WITH PROPOFOL;  Surgeon: Lucilla Lame,  MD;  Location: [facility] ENDOSCOPY;  Service: Endoscopy;  Laterality: N/A;  . GIVENS CAPSULE STUDY N/A 10/08/2019   Procedure: GIVENS CAPSULE STUDY;  Surgeon: Lucilla Lame, MD;  Location: [facility] ENDOSCOPY;  Service: Endoscopy;  Laterality: N/A;  . HIP FRACTURE SURGERY     Social History   Socioeconomic History  . Marital status: Widowed    Spouse name: Not on file  . Number of children: Not on file  . Years of education: Not on file  . Highest education level: Not on file  Occupational History  . Not on file  Social Needs  . Financial resource strain: Not hard at all  . Food insecurity    Worry: Never true    Inability: Never true  . Transportation needs    Medical: No    Non-medical: No  Tobacco Use  . Smoking status: Current Some Day Smoker    Packs/day: 1.00    Years: 46.00    Pack years: 46.00    Types: Cigarettes  . Smokeless tobacco: Never Used  . Tobacco comment: she feels that the 21 nicotine made her jittery and we suggested a lower level patch  Substance and Sexual Activity  . Alcohol use: No  . Drug use: Yes    Types: Marijuana    Comment: monthly  . Sexual activity: Not Currently  Lifestyle  . Physical activity    Days per week: 0 days    Minutes per session: 0 min  . Stress: Very much  Relationships  . Social connections    Talks on phone: Patient refused  Gets together: Patient refused    Attends religious service: Patient refused    Active member of club or organization: Patient refused    Attends meetings of clubs or organizations: Patient refused    Relationship status: Patient refused  Other Topics Concern  . Not on file  Social History Narrative  . Not on file   Family History  Problem Relation Age of Onset  . Heart failure Mother   . Heart attack Mother        mother died at 56 of an MI  . Heart attack Father        father died at 42 of an MI  . Heart attack Brother        Brother had an MI in his 69s   Allergies  Allergen Reactions  .  Chantix [Varenicline] Hives, Shortness Of Breath and Palpitations  . Diphenhydramine Hcl Shortness Of Breath  . Potassium Itching and Swelling  . Benadryl [Diphenhydramine Hcl] Rash  . Niacin Rash  . Trazodone Palpitations   Prior to Admission medications   Medication Sig Start Date End Date Taking? Authorizing Provider  acetaminophen (TYLENOL) 325 MG tablet Take 3 tablets (975 mg total) by mouth 3 (three) times daily as needed. 02/11/19  Yes Carrie Mew, MD  albuterol (PROVENTIL) (2.5 MG/3ML) 0.083% nebulizer solution Take 3 mLs (2.5 mg total) by nebulization every 6 (six) hours as needed for wheezing or shortness of breath. 05/18/19  Yes Mayo, Pete Pelt, MD  alprazolam Duanne Moron) 2 MG tablet Take 2 mg by mouth 2 (two) times daily.    Yes [provider]  atorvastatin (LIPITOR) 80 MG tablet Take 80 mg by mouth daily.   Yes [provider]  busPIRone (BUSPAR) 10 MG tablet Take 10 mg by mouth 2 (two) times daily.    Yes [provider]  carvedilol (COREG) 6.25 MG tablet Take 6.25 mg by mouth 2 (two) times daily. 05/28/17  Yes [provider]  citalopram (CELEXA) 40 MG tablet Take 40 mg by mouth daily.   Yes [provider]  DULoxetine (CYMBALTA) 30 MG capsule Take 30 mg by mouth daily. 05/28/17  Yes [provider]  furosemide (LASIX) 40 MG tablet Take 1 tablet (40 mg total) by mouth daily. 05/19/19  Yes Lang Snow, NP  gabapentin (NEURONTIN) 300 MG capsule Take 300 mg by mouth 2 (two) times daily.    Yes [provider]  ipratropium-albuterol (DUONEB) 0.5-2.5 (3) MG/3ML SOLN Take 3 mLs by nebulization every 6 (six) hours as needed. 05/18/19  Yes Lang Snow, NP  lisinopril (PRINIVIL,ZESTRIL) 20 MG tablet Take 20 mg by mouth daily.     Yes [provider]  pantoprazole (PROTONIX) 40 MG tablet Take 1 tablet (40 mg total) by mouth 2 (two) times daily before a meal. 10/09/19  Yes Max Sane, MD  SYMBICORT  80-4.5 MCG/ACT inhaler Inhale 2 puffs into the lungs 2 (two) times daily. 08/12/18  Yes [provider]  nitroGLYCERIN (NITROSTAT) 0.4 MG SL tablet Place 0.4 mg under the tongue as needed. 10/06/19   [provider]  pregabalin (LYRICA) 150 MG capsule Take 150 mg by mouth 2 (two) times daily. 11/18/18 11/18/19  [provider]  Vitamin D, Ergocalciferol, (DRISDOL) 50000 units CAPS capsule Take 50,000 Units by mouth every Saturday.  05/01/17   [provider]   Ct Pelvis Wo Contrast  Result Date: 11/13/2019 CLINICAL DATA:  Pain after a fall EXAM: CT PELVIS WITHOUT CONTRAST TECHNIQUE: Multidetector  CT imaging of the pelvis was performed following the standard protocol without intravenous contrast. COMPARISON:  Radiograph same day FINDINGS: Urinary Tract: The visualized distal ureters and bladder appear unremarkable. Bowel: No bowel wall thickening, distention or surrounding inflammation identified within the pelvis. Scattered colonic diverticula are noted. Vascular/Lymphatic: No enlarged pelvic lymph nodes identified. Scattered aortic atherosclerotic calcifications are seen without aneurysmal dilatation. Reproductive: The uterus and adnexa are unremarkable. Other: Mild soft tissue swelling seen over the right lateral hip Musculoskeletal: There is a comminuted mildly impacted fracture involving the right greater trochanter. No other fracture is seen. The femoral head is still well seated within the acetabulum. The patient is status post ORIF with IM nail fixation of the left femur. No periprosthetic lucency or fracture is seen. Sclerosis around bilateral sacroiliac joints seen. Mild degenerative changes in the lower lumbar spine. There is mild fatty atrophy of the muscles surrounding the pelvis. A small right hip joint effusion is seen. The tendons appear to be grossly intact. IMPRESSION: 1. Comminuted mildly impacted right greater trochanter fracture. 2. Status post right IM  nail fixation without hardware complication Electronically Signed   By: Prudencio Pair M.D.   On: 11/13/2019 01:40   Dg Chest Portable 1 View  Result Date: 11/13/2019 CLINICAL DATA:  Leukocytosis. EXAM: PORTABLE CHEST 1 VIEW COMPARISON:  October 06, 2019 FINDINGS: The heart size and mediastinal contours are within normal limits. Both lungs are clear. The visualized skeletal structures are unremarkable. IMPRESSION: No active disease. Electronically Signed   By: Dorise Bullion III M.D   On: 11/13/2019 02:09   Dg Hip Unilat W Or Wo Pelvis 2-3 Views Right  Result Date: 11/13/2019 CLINICAL DATA:  Pain after fall EXAM: DG HIP (WITH OR WITHOUT PELVIS) 2-3V RIGHT COMPARISON:  None. FINDINGS: Gamma nails in a femoral rod extend through the proximal left femur. No pelvic bone fractures are noted. The right hip is intact. IMPRESSION: Negative. Electronically Signed   By: Dorise Bullion III M.D   On: 11/13/2019 00:30    Positive ROS: All other systems have been reviewed and were otherwise negative with the exception of those mentioned in the HPI and as above.  Physical Exam: General: Alert, no acute distress Cardiovascular: No pedal edema Respiratory: No cyanosis, no use of accessory musculature GI: No organomegaly, abdomen is soft and non-tender Skin: No lesions in the area of chief complaint Neurologic: Sensation intact distally Psychiatric: Patient is competent for consent with normal mood and affect Lymphatic: No axillary or cervical lymphadenopathy  MUSCULOSKELETAL: Alert and cooperative.  Tender over the right greater trochanteric region.  The skin is intact.  No obvious bruising yet.  Mild bruise over the front of the knee.  Neurovascular status is good distally.  No other injuries are noted.  Assessment: Greater trochanteric fracture right hip  Plan: The patient will be treated for pain control. PT will ambulate the patient weightbearing as tolerated on the right. Possible placement  although the patient prefers to go home and take care of her dog. Follow-up my office 10 days to 2 weeks for exam and x-rays. Recommend enteric-coated aspirin 325 mg twice daily for DVT prophylaxis on discharge.    Park Breed, MD [phone]   11/13/2019 11:53 AM

## 2019-11-13 NOTE — ED Provider Notes (Addendum)
[facility] Emergency Department Provider Note  ____________________________________________  Time seen: Approximately 12:00 AM  I have reviewed the triage vital signs and the nursing notes.   HISTORY  Chief Complaint Fall and Palpitations   HPI Traci INTRIAGO is a 63 y.o. female with history of anxiety, anemia, CHF, COPD, CAD, hypertension, hyperlipidemia who presents for evaluation of right hip pain status post mechanical fall.  Patient reports that she tripped over her dog today around 5:30 PM and fell onto her right hip.  She denies head trauma or LOC.  She was unable to stand up or bear weight.  She dragged herself to the couch and laid there for several hours hoping that her pain was gone to get better.  When the pain did not improve and she was unable to stand she called 911.  She is complaining of 10 out of 10 pain in the right hip which has been constant and worse with minimal movement.  She denies neck pain or back pain.  Patient also reports that since the fall she has been having palpitations.  She thinks she is withdrawing from Xanax.  She has been on it for several years and ran out of her prescription 2 days ago.  She reports that her refills in the mail but has not arrived yet.  She has been having palpitations, feeling very anxious, having panic attacks, slight tremor.  She denies chest pain or shortness of breath.   Past Medical History:  Diagnosis Date  . Anemia   . Anxiety   . CHF (congestive heart failure) ([place])   . COPD (chronic obstructive pulmonary disease) ([place])   . Coronary artery disease   . Depression   . Gastritis   . GERD (gastroesophageal reflux disease)   . Hemorrhoids   . Hyperlipidemia   . Hypertension   . Myocardial infarction ([place])   . PAD (peripheral artery disease) [facility])     Patient Active Problem List   Diagnosis Date Noted  . Occult GI bleeding   . Symptomatic anemia 10/06/2019  . Acute CHF (congestive heart  failure) ([place]) 05/16/2019  . Rectal bleed 11/19/2018  . 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 Benzodiazepine abuse ([place]) 09/03/2018  . Moderate recurrent major depression ([place]) 09/03/2018  . Hyponatremia 10/11/2017  . Chest pain 07/28/2017  . Acute blood loss anemia 07/28/2017  . Demand ischemia ([place])   . Benign neoplasm of descending colon   . Polyp of sigmoid colon   . Age related osteoporosis 04/16/2017  . Iron deficiency anemia secondary to blood loss (chronic)   . GI bleed 04/06/2017  . Diastolic dysfunction [id number]  . Opioid contract exists 06/27/2016  . Incidental lung nodule 01/18/2016  . Traumatic hemorrhage of cerebrum ([place]) 12/04/2015  . Hypovitaminosis D 08/29/2015  . Thyroid lesion 08/29/2015  . Major depressive disorder, single episode, unspecified 08/27/2015  . 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 Gastroesophageal reflux disease 01/26/2015  . Osteopenia 01/26/2015  . Anemia, unspecified 12/27/2014  . History of fall 12/27/2014  . Greater tuberosity of humerus fracture 10/24/2013  . AVF (arteriovenous fistula) ([place]) 11/08/2011  . Anxiety 10/24/2011  . Cerebrovascular disease 10/24/2011  . Dyslipidemia 10/24/2011  . Hypertension 10/24/2011  . Tobacco abuse 10/24/2011  . Palpitations 10/24/2011  . Renal artery stenosis ([place]) 10/24/2011  . Unstable angina ([place]) 10/14/2011  . Hyperlipidemia   . Chronic obstructive pulmonary disease ([place])   . Depression   . Peripheral arterial disease ([place])   .  Coronary artery disease, non-occlusive     Past Surgical History:  Procedure Laterality Date  . APPENDECTOMY    . COLONOSCOPY WITH PROPOFOL N/A 06/09/2017   Procedure: COLONOSCOPY WITH PROPOFOL;  Surgeon: Lucilla Lame, MD;  Location: [facility] ENDOSCOPY;  Service: Endoscopy;  Laterality: N/A;  . COLONOSCOPY WITH PROPOFOL N/A 10/08/2019   Procedure: COLONOSCOPY WITH PROPOFOL;  Surgeon: Lucilla Lame, MD;  Location: [facility] ENDOSCOPY;  Service: Endoscopy;   Laterality: N/A;  . ENTEROSCOPY N/A 10/08/2019   Procedure: ENTEROSCOPY;  Surgeon: Lucilla Lame, MD;  Location: [facility] ENDOSCOPY;  Service: Endoscopy;  Laterality: N/A;  . ESOPHAGOGASTRODUODENOSCOPY N/A 07/30/2017   Procedure: ESOPHAGOGASTRODUODENOSCOPY (EGD);  Surgeon: Lin Landsman, MD;  Location: [facility] ENDOSCOPY;  Service: Gastroenterology;  Laterality: N/A;  . ESOPHAGOGASTRODUODENOSCOPY (EGD) WITH PROPOFOL N/A 04/09/2017   Procedure: ESOPHAGOGASTRODUODENOSCOPY (EGD) WITH PROPOFOL;  Surgeon: Lucilla Lame, MD;  Location: [facility] ENDOSCOPY;  Service: Endoscopy;  Laterality: N/A;  . GIVENS CAPSULE STUDY N/A 10/08/2019   Procedure: GIVENS CAPSULE STUDY;  Surgeon: Lucilla Lame, MD;  Location: [facility] ENDOSCOPY;  Service: Endoscopy;  Laterality: N/A;  . HIP FRACTURE SURGERY      Prior to Admission medications   Medication Sig Start Date End Date Taking? Authorizing Provider  acetaminophen (TYLENOL) 325 MG tablet Take 3 tablets (975 mg total) by mouth 3 (three) times daily as needed. 02/11/19  Yes Carrie Mew, MD  albuterol (PROVENTIL) (2.5 MG/3ML) 0.083% nebulizer solution Take 3 mLs (2.5 mg total) by nebulization every 6 (six) hours as needed for wheezing or shortness of breath. 05/18/19  Yes Mayo, Pete Pelt, MD  alprazolam Duanne Moron) 2 MG tablet Take 2 mg by mouth 2 (two) times daily.    Yes [provider]  atorvastatin (LIPITOR) 80 MG tablet Take 80 mg by mouth daily.   Yes [provider]  busPIRone (BUSPAR) 10 MG tablet Take 10 mg by mouth 2 (two) times daily.    Yes [provider]  carvedilol (COREG) 6.25 MG tablet Take 6.25 mg by mouth 2 (two) times daily. 05/28/17  Yes [provider]  citalopram (CELEXA) 40 MG tablet Take 40 mg by mouth daily.   Yes [provider]  DULoxetine (CYMBALTA) 30 MG capsule Take 30 mg by mouth daily. 05/28/17  Yes [provider]  furosemide (LASIX) 40 MG tablet Take 1 tablet (40 mg total) by mouth daily. 05/19/19   Yes Lang Snow, NP  gabapentin (NEURONTIN) 300 MG capsule Take 300 mg by mouth 2 (two) times daily.    Yes [provider]  ipratropium-albuterol (DUONEB) 0.5-2.5 (3) MG/3ML SOLN Take 3 mLs by nebulization every 6 (six) hours as needed. 05/18/19  Yes Lang Snow, NP  lisinopril (PRINIVIL,ZESTRIL) 20 MG tablet Take 20 mg by mouth daily.     Yes [provider]  pantoprazole (PROTONIX) 40 MG tablet Take 1 tablet (40 mg total) by mouth 2 (two) times daily before a meal. 10/09/19  Yes Max Sane, MD  SYMBICORT 80-4.5 MCG/ACT inhaler Inhale 2 puffs into the lungs 2 (two) times daily. 08/12/18  Yes [provider]  nitroGLYCERIN (NITROSTAT) 0.4 MG SL tablet Place 0.4 mg under the tongue as needed. 10/06/19   [provider]  pregabalin (LYRICA) 150 MG capsule Take 150 mg by mouth 2 (two) times daily. 11/18/18 11/18/19  [provider]  Vitamin D, Ergocalciferol, (DRISDOL) 50000 units CAPS capsule Take 50,000 Units by mouth every Saturday.  05/01/17   [provider]    Allergies Chantix [varenicline], Diphenhydramine  hcl, Potassium, Benadryl [diphenhydramine hcl], Niacin, and Trazodone  Family History  Problem Relation Age of Onset  . Heart failure Mother   . Heart attack Mother        mother died at 24 of an MI  . Heart attack Father        father died at 97 of an MI  . Heart attack Brother        Brother had an MI in his 53s    Social History Social History   Tobacco Use  . Smoking status: Current Some Day Smoker    Packs/day: 1.00    Years: 46.00    Pack years: 46.00    Types: Cigarettes  . Smokeless tobacco: Never Used  . Tobacco comment: she feels that the 21 nicotine made her jittery and we suggested a lower level patch  Substance Use Topics  . Alcohol use: No  . Drug use: Yes    Types: Marijuana    Comment: monthly    Review of Systems  Constitutional: Negative for fever. Eyes: Negative for  visual changes. ENT: Negative for facial injury or neck injury Cardiovascular: Negative for chest injury. + palpitations Respiratory: Negative for shortness of breath. Negative for chest wall injury. Gastrointestinal: Negative for abdominal pain or injury. Genitourinary: Negative for dysuria. Musculoskeletal: Negative for back injury, + R hip pain Skin: Negative for laceration/abrasions. Neurological: Negative for head injury. Psych: + anxiety   ____________________________________________   PHYSICAL EXAM:  VITAL SIGNS: ED Triage Vitals  Enc Vitals Group     BP 11/12/19 2351 (!) 171/102     Pulse Rate 11/12/19 2351 78     Resp 11/12/19 2351 18     Temp 11/12/19 2351 99.1 F (37.3 C)     Temp Source 11/12/19 2351 Oral     SpO2 11/12/19 2351 97 %     Weight 11/12/19 2353 139 lb (63 kg)     Height 11/12/19 2353 5\' 7"  (1.702 m)     Head Circumference --      Peak Flow --      Pain Score 11/12/19 2353 10     Pain Loc --      Pain Edu? --      Excl. in [place]? --     Full spinal precautions maintained throughout the trauma exam. Constitutional: Alert and oriented. Looks anxious and jitetry. Does not appear intoxicated. HEENT Head: Normocephalic and atraumatic. Face: No facial bony tenderness. Stable midface Ears: No hemotympanum bilaterally. No Battle sign Eyes: No eye injury. PERRL. No raccoon eyes Nose: Nontender. No epistaxis. No rhinorrhea Mouth/Throat: Mucous membranes are moist. No oropharyngeal blood. No dental injury. Airway patent without stridor. Normal voice. Neck: no C-collar. No midline c-spine tenderness.  Cardiovascular: Normal rate, regular rhythm. Normal and symmetric distal pulses are present in all extremities. Pulmonary/Chest: Chest wall is stable and nontender to palpation/compression. Normal respiratory effort. Breath sounds are normal. No crepitus.  Abdominal: Soft, nontender, non distended. Musculoskeletal: Tender to palpation over the R lateral femoral  head. Nontender with normal full range of motion in all other extremities. No deformities. No thoracic or lumbar midline spinal tenderness. Pelvis is stable. Skin: Skin is warm, dry and intact. No abrasions or contutions. Psychiatric: Speech and behavior are appropriate. Neurological: Normal speech and language. Moves all extremities to command. No gross focal neurologic deficits are appreciated.  Glascow Coma Score: 4 - Opens eyes on own 6 - Follows simple motor commands 5 - Alert and oriented GCS: 15  ____________________________________________   LABS (all labs ordered are listed, but only abnormal results are displayed)  Labs Reviewed  CBC WITH DIFFERENTIAL/PLATELET - Abnormal; Notable for the following components:      Result Value   WBC 17.9 (*)    MCH 25.5 (*)    Neutro Abs 14.3 (*)    Monocytes Absolute 1.2 (*)    All other components within normal limits  COMPREHENSIVE METABOLIC PANEL - Abnormal; Notable for the following components:   Glucose, Bld 134 (*)    All other components within normal limits  URINALYSIS, COMPLETE (UACMP) WITH MICROSCOPIC - Abnormal; Notable for the following components:   Color, Urine YELLOW (*)    APPearance CLEAR (*)    All other components within normal limits  TROPONIN I (HIGH SENSITIVITY)   ____________________________________________  EKG  ED ECG REPORT I, Rudene Re, the attending physician, personally viewed and interpreted this ECG.  Normal sinus rhythm, rate of 73, normal intervals, normal axis, no ST elevations or depressions.  Normal EKG. ____________________________________________  RADIOLOGY  I have personally reviewed the images performed during this visit and I agree with the Radiologist's read.   Interpretation by Radiologist:  Ct Pelvis Wo Contrast  Result Date: 11/13/2019 CLINICAL DATA:  Pain after a fall EXAM: CT PELVIS WITHOUT CONTRAST TECHNIQUE: Multidetector CT imaging of the pelvis was performed  following the standard protocol without intravenous contrast. COMPARISON:  Radiograph same day FINDINGS: Urinary Tract: The visualized distal ureters and bladder appear unremarkable. Bowel: No bowel wall thickening, distention or surrounding inflammation identified within the pelvis. Scattered colonic diverticula are noted. Vascular/Lymphatic: No enlarged pelvic lymph nodes identified. Scattered aortic atherosclerotic calcifications are seen without aneurysmal dilatation. Reproductive: The uterus and adnexa are unremarkable. Other: Mild soft tissue swelling seen over the right lateral hip Musculoskeletal: There is a comminuted mildly impacted fracture involving the right greater trochanter. No other fracture is seen. The femoral head is still well seated within the acetabulum. The patient is status post ORIF with IM nail fixation of the left femur. No periprosthetic lucency or fracture is seen. Sclerosis around bilateral sacroiliac joints seen. Mild degenerative changes in the lower lumbar spine. There is mild fatty atrophy of the muscles surrounding the pelvis. A small right hip joint effusion is seen. The tendons appear to be grossly intact. IMPRESSION: 1. Comminuted mildly impacted right greater trochanter fracture. 2. Status post right IM nail fixation without hardware complication Electronically Signed   By: Prudencio Pair M.D.   On: 11/13/2019 01:40   Dg Chest Portable 1 View  Result Date: 11/13/2019 CLINICAL DATA:  Leukocytosis. EXAM: PORTABLE CHEST 1 VIEW COMPARISON:  October 06, 2019 FINDINGS: The heart size and mediastinal contours are within normal limits. Both lungs are clear. The visualized skeletal structures are unremarkable. IMPRESSION: No active disease. Electronically Signed   By: Dorise Bullion III M.D   On: 11/13/2019 02:09   Dg Hip Unilat W Or Wo Pelvis 2-3 Views Right  Result Date: 11/13/2019 CLINICAL DATA:  Pain after fall EXAM: DG HIP (WITH OR WITHOUT PELVIS) 2-3V RIGHT COMPARISON:   None. FINDINGS: Gamma nails in a femoral rod extend through the proximal left femur. No pelvic bone fractures are noted. The right hip is intact. IMPRESSION: Negative. Electronically Signed   By: Dorise Bullion III M.D   On: 11/13/2019 00:30      ____________________________________________   PROCEDURES  Procedure(s) performed: None Procedures Critical Care performed:  None ____________________________________________   INITIAL IMPRESSION / ASSESSMENT AND PLAN / ED  COURSE  63 y.o. female with history of anxiety, anemia, CHF, COPD, CAD, hypertension, hyperlipidemia who presents for evaluation of right hip pain status post mechanical fall.  # hip pain: XR showed no fracture. Fentanyl for pain  # Palpitations: EKG showing NSR, patient looks anxious and jittery, elevated BP. Possible withdrawal from benzos vs pain. Will treat pain, will give dose of xanax. Will check labs to rule out electrolyte abnormalities, anemia, dehydration.    _________________________ 1:51 AM on 11/13/2019 -----------------------------------------  CT confirms comminuted mildly impacted right greater trochanter fracture.  Will admit patient to the hospitalist service.  Will consult Dr. Sabra Heck from orthopedics.  Labs showing leukocytosis of unknown etiology most likely due to the stress of the fall and fracture.  Urinalysis is pending to rule out UTI however patient has no symptoms of UTI.  Chest x-ray also pending.  _________________________ 2:58 AM on 11/13/2019 -----------------------------------------  Discussed with Dr. Sabra Heck who recommended admission for PT and pain control.  Nonsurgical fracture.  Discussed with Dr. Flossie Buffy who will admit patient.  _________________________ 3:17 AM on 11/13/2019 -----------------------------------------  CXR and UA negative for infection.   Please note:  Patient was evaluated in Emergency Department today for the symptoms described in the history of present illness.  Patient was evaluated in the context of the global COVID-19 pandemic, which necessitated consideration that the patient might be at risk for infection with the SARS-CoV-2 virus that causes COVID-19. Institutional protocols and algorithms that pertain to the evaluation of patients at risk for COVID-19 are in a state of rapid change based on information released by regulatory bodies including the CDC and federal and state organizations. These policies and algorithms were followed during the patient's care in the ED.  Some ED evaluations and interventions may be delayed as a result of limited staffing during the pandemic.   As part of my medical decision making, I reviewed the following data within the [place] notes reviewed and incorporated, Labs reviewed , EKG interpreted , Old chart reviewed, Radiograph reviewed , Discussed with admitting physician , A consult was requested and obtained from this/these consultant(s) orthopedics, Notes from prior ED visits and [place] Controlled Substance Database   ____________________________________________   FINAL CLINICAL IMPRESSION(S) / ED DIAGNOSES   Final diagnoses:  Fall, initial encounter  Closed nondisplaced fracture of greater trochanter of right femur, initial encounter ([place])      NEW MEDICATIONS STARTED DURING THIS VISIT:  ED Discharge Orders    None       Note:  This document was prepared using Dragon voice recognition software and may include unintentional dictation errors.    Alfred Levins, [place], MD 11/13/19 [place], [place], MD 11/13/19 [phone]

## 2019-11-13 NOTE — ED Notes (Signed)
Report given to Noel RN 

## 2019-11-13 NOTE — Progress Notes (Signed)
Pt requests to speak with SW regarding discharge plans, is concerned about going to rehab facility, would like to discuss alternative options if available.

## 2019-11-14 DIAGNOSIS — J432 Centrilobular emphysema: Secondary | ICD-10-CM

## 2019-11-14 DIAGNOSIS — I5189 Other ill-defined heart diseases: Secondary | ICD-10-CM

## 2019-11-14 MED ORDER — OXYCODONE HCL 10 MG PO TABS: 10.0000 mg | ORAL_TABLET | Freq: Three times a day (TID) | ORAL | 0 refills | Status: DC | PRN

## 2019-11-14 NOTE — Progress Notes (Signed)
EMS called for transport.

## 2019-11-14 NOTE — TOC Transition Note (Signed)
Transition of Care [facility]) - CM/SW Discharge Note   Patient Details  Name: Traci Duran MRN: [id number] Date of Birth: February 09, 1956  [facility]) CM/SW Contact:  Su Hilt, RN Phone Number: 11/14/2019, 9:42 AM   Clinical Narrative:    Met with the patient to discuss DC plan and needs She lives with her grandson who is at work all day until about 46 PM, She needs a RW, She will need EMS to take her home and I explained if she was to go home with EMS they do not take the DME with them and it would need to be delivered to the home, she stated that is acceptable.  I notified Brad with [employer] of the need and that it needs to be delivered today if at all possible. The patient agreed to have Home health with [facility] and I provided the information to [place] with [facility], she accepted  She asked for samples of pain medication, I explained that we are not able to give samples for pain medication, she said she would have her grandson go pick them up when he gets home, Dr Loleta Books will send to a pharmacy open past 5 Pm.   Final next level of care: [place] Barriers to Discharge: Barriers Resolved   Patient Goals and CMS Choice Patient states their goals for this hospitalization and ongoing recovery are:: go home      Discharge Placement                       Discharge Plan and Services   Discharge Planning Services: CM Consult            DME Arranged: Gilford Rile rolling DME Agency: [employer] Date DME Agency Contacted: 11/14/19 Time DME Agency Contacted: [phone] Representative spoke with at DME Agency: [place]: PT [place]: [facility] Date [place]: 11/14/19 Time [place]: [phone] Representative spoke with at [place]: [place] ([place]) Interventions     Readmission Risk Interventions Readmission Risk Prevention Plan 10/07/2019  Transportation  Screening Complete  PCP or Specialist Appt within 3-5 Days Complete  HRI or [place] Complete  Social Work Consult for [place] Planning/Counseling [place] Not Applicable  Medication Review Press photographer) Complete  Some recent data might be hidden

## 2019-11-14 NOTE — Progress Notes (Signed)
D: Pt alert and oriented.  A: Pt received discharge and medication education/information. Pt belongings were gathered and taken with pt upon discharge to include one walker.   R: Pt verbalized understanding of discharge and medication education/information.  Pt escorted via EMS home.

## 2019-11-14 NOTE — Discharge Summary (Signed)
Physician Discharge Summary  Traci Duran PIR:[id number] DOB: Feb 06, 1956 DOA: 11/12/2019  PCP: [facility]  Admit date: 11/12/2019 Discharge date: 11/14/2019  Admitted From: Home  Disposition:  Home with [place]   Recommendations for Outpatient Follow-up:  1. Follow up with PCP in 1-2 weeks 2. Follow up with Dr. Hyacinth Meeker in 1 week 3. Dr. Janeece Riggers: Consider taper and discontinuing benzodiazepine      Home Health: PT/OT, patient has fractured greater trochanter, pain limits leaving the house Equipment/Devices: 3-in-1, rolling walker  Discharge Condition: Fair  CODE STATUS: FULL Diet recommendation: Cardiac  Brief/Interim Summary: Traci Duran is a 63 y.o. F with hx CVA, CAD, dCHF, COPD not on O2, HTN and anxiety on daily Xanax who presented for a fall. Patient was letting her dog out today and turned and tripped on her dog falling onto her right hip. It took her about 15 minutes to crawl up to the couch and call for help. Had run out of Xanax 2 days prior, and was feeling withdrawals because her refill had not arrived in mail yet.  In the ER, afebrile, normotensive.  CT of the hip showed a greater trochanter fracture. Case discussed with orthopedic Dr. Hyacinth Meeker who recommended non-operative mgmt and pain control.  Patient unable to stand and so hospitalist service asked to observe for PT evaluation     PRINCIPAL HOSPITAL DIAGNOSIS: Greater trochanter of femur fracture, right    Discharge Diagnoses:   RIGHT femur greater trochanter fracture in setting of Xanax withdrawal CT in the ER showed fracture of greater trochanter.  Dr. Hyacinth Meeker of Orthopedics was consulted, who recommended non-operative management, weight bearing as tolerated.  Patient's pain was controlled with acetaminophen and oxycodone 10 mg.  PT evaluated patient, were able to ambulate with her, recommended HHPT.  Leukocytosis Likely reactive. No signs of infection  Hypertension  Chronic diastolic  heart failure No signs of decompensation.  COPD No active disease  Depression/anxiety Benzodiazepines are associated with an increased risk of falls in the elderly.  This patient was counseled strongly to reduce.  Counseled about black box warning of risks of concurrent oxycodone and alprazolam.  Given her fracture and severe pain, benefit of oxycodone outweighs benefit of Xanax and I recommended stopping Xanax at home with quick taper and using oxycodone for pain.  Neuropathic pain of bilateral lower extremity  Hyperlipidemia  GERD          Discharge Instructions  Discharge Instructions    Increase activity slowly   Complete by: As directed      Allergies as of 11/14/2019      Reactions   Chantix [varenicline] Hives, Shortness Of Breath, Palpitations   Diphenhydramine Hcl Shortness Of Breath   Potassium Itching, Swelling   Benadryl [diphenhydramine Hcl] Rash   Niacin Rash   Trazodone Palpitations      Medication List    TAKE these medications   acetaminophen 325 MG tablet Commonly known as: TYLENOL Take 3 tablets (975 mg total) by mouth 3 (three) times daily as needed. Notes to patient: Last dose given today at 10:10 AM   albuterol (2.5 MG/3ML) 0.083% nebulizer solution Commonly known as: PROVENTIL Take 3 mLs (2.5 mg total) by nebulization every 6 (six) hours as needed for wheezing or shortness of breath. Notes to patient: Not given in hospital   alprazolam 2 MG tablet Commonly known as: XANAX Take 2 mg by mouth 2 (two) times daily. Notes to patient: Last dose given today at 10:10 AM  atorvastatin 80 MG tablet Commonly known as: LIPITOR Take 80 mg by mouth daily. Notes to patient: Last dose given today at 10:09 AM   busPIRone 10 MG tablet Commonly known as: BUSPAR Take 10 mg by mouth 2 (two) times daily. Notes to patient: Last dose given today at 10:10 AM   carvedilol 6.25 MG tablet Commonly known as: COREG Take 6.25 mg by mouth 2 (two)  times daily. Notes to patient: Last dose given today at 10:11 AM   citalopram 40 MG tablet Commonly known as: CELEXA Take 40 mg by mouth daily. Notes to patient: Last dose given today at 10:10 AM   DULoxetine 30 MG capsule Commonly known as: CYMBALTA Take 30 mg by mouth daily. Notes to patient: Last dose given today at 10:12 AM   furosemide 40 MG tablet Commonly known as: LASIX Take 1 tablet (40 mg total) by mouth daily. Notes to patient: Last dose given today at 10:10 AM   gabapentin 300 MG capsule Commonly known as: NEURONTIN Take 300 mg by mouth 2 (two) times daily. Notes to patient: Last dose given today at 10:11 AM   ipratropium-albuterol 0.5-2.5 (3) MG/3ML Soln Commonly known as: DUONEB Take 3 mLs by nebulization every 6 (six) hours as needed. Notes to patient: Not given in hospital   lisinopril 20 MG tablet Commonly known as: ZESTRIL Take 20 mg by mouth daily. Notes to patient: Last dose given today at 10:11 AM   nitroGLYCERIN 0.4 MG SL tablet Commonly known as: NITROSTAT Place 0.4 mg under the tongue as needed. Notes to patient: Not given in hospital   Oxycodone HCl 10 MG Tabs Take 1 tablet (10 mg total) by mouth every 8 (eight) hours as needed for breakthrough pain.   pantoprazole 40 MG tablet Commonly known as: PROTONIX Take 1 tablet (40 mg total) by mouth 2 (two) times daily before a meal. Notes to patient: Last dose given today at 10:09 AM   pregabalin 150 MG capsule Commonly known as: LYRICA Take 150 mg by mouth 2 (two) times daily. Notes to patient: Not given in hospital   Symbicort 80-4.5 MCG/ACT inhaler Generic drug: budesonide-formoterol Inhale 2 puffs into the lungs 2 (two) times daily. Notes to patient: Not given in hospital   Vitamin D (Ergocalciferol) 1.25 MG (50000 UT) Caps capsule Commonly known as: DRISDOL Take 50,000 Units by mouth every Saturday. Notes to patient: Not given in hospital            Durable Medical Equipment   (From admission, onward)         Start     Ordered   11/14/19 1104  DME 3-in-1  Once     11/14/19 1105   11/14/19 1104  For home use only DME Walker rolling  [facility])  Once    Question:  Patient needs a walker to treat with the following condition  Answer:  Femur fracture, right (HCC)   11/14/19 1105         Follow-up Information    Deeann Saint, MD. Schedule an appointment as soon as possible for a visit on 11/25/2019.   Specialty: Orthopedic Surgery Why: @ 10:30 am Contact information: [address] [place] [address] [phone]          Allergies  Allergen Reactions  . Chantix [Varenicline] Hives, Shortness Of Breath and Palpitations  . Diphenhydramine Hcl Shortness Of Breath  . Potassium Itching and Swelling  . Benadryl [Diphenhydramine Hcl] Rash  . Niacin Rash  . Trazodone  Palpitations    Consultations:  Orthopedics   Procedures/Studies: Ct Pelvis Wo Contrast  Result Date: 11/13/2019 CLINICAL DATA:  Pain after a fall EXAM: CT PELVIS WITHOUT CONTRAST TECHNIQUE: Multidetector CT imaging of the pelvis was performed following the standard protocol without intravenous contrast. COMPARISON:  Radiograph same day FINDINGS: Urinary Tract: The visualized distal ureters and bladder appear unremarkable. Bowel: No bowel wall thickening, distention or surrounding inflammation identified within the pelvis. Scattered colonic diverticula are noted. Vascular/Lymphatic: No enlarged pelvic lymph nodes identified. Scattered aortic atherosclerotic calcifications are seen without aneurysmal dilatation. Reproductive: The uterus and adnexa are unremarkable. Other: Mild soft tissue swelling seen over the right lateral hip Musculoskeletal: There is a comminuted mildly impacted fracture involving the right greater trochanter. No other fracture is seen. The femoral head is still well seated within the acetabulum. The patient is status post ORIF with IM nail fixation of the left  femur. No periprosthetic lucency or fracture is seen. Sclerosis around bilateral sacroiliac joints seen. Mild degenerative changes in the lower lumbar spine. There is mild fatty atrophy of the muscles surrounding the pelvis. A small right hip joint effusion is seen. The tendons appear to be grossly intact. IMPRESSION: 1. Comminuted mildly impacted right greater trochanter fracture. 2. Status post right IM nail fixation without hardware complication Electronically Signed   By: Jonna Clark M.D.   On: 11/13/2019 01:40   Dg Chest Portable 1 View  Result Date: 11/13/2019 CLINICAL DATA:  Leukocytosis. EXAM: PORTABLE CHEST 1 VIEW COMPARISON:  October 06, 2019 FINDINGS: The heart size and mediastinal contours are within normal limits. Both lungs are clear. The visualized skeletal structures are unremarkable. IMPRESSION: No active disease. Electronically Signed   By: Gerome Sam III M.D   On: 11/13/2019 02:09   Dg Hip Unilat W Or Wo Pelvis 2-3 Views Right  Result Date: 11/13/2019 CLINICAL DATA:  Pain after fall EXAM: DG HIP (WITH OR WITHOUT PELVIS) 2-3V RIGHT COMPARISON:  None. FINDINGS: Gamma nails in a femoral rod extend through the proximal left femur. No pelvic bone fractures are noted. The right hip is intact. IMPRESSION: Negative. Electronically Signed   By: Gerome Sam III M.D   On: 11/13/2019 00:30       Subjective: No fever, cough, sputum, dysuria.  Appetite okay, no confusion.  No dyspnea.  Discharge Exam: Vitals:   11/14/19 0825 11/14/19 1007  BP: 121/66 123/74  Pulse: 71 78  Resp: 18   Temp: 98.1 F (36.7 C)   SpO2: 96% 96%   Vitals:   11/13/19 2238 11/13/19 2239 11/14/19 0825 11/14/19 1007  BP: 106/64  121/66 123/74  Pulse: 73 73 71 78  Resp: 18  18   Temp: 97.9 F (36.6 C)  98.1 F (36.7 C)   TempSrc:   Oral   SpO2: (!) 89% 95% 96% 96%  Weight:      Height:        General: Pt is alert, awake, not in acute distress, appears tired. Cardiovascular: RRR, nl S1-S2,  no murmurs appreciated.   No LE edema.   Respiratory: Normal respiratory rate and rhythm.  CTAB without rales or wheezes. MSK: right hip tender.  No redness or swelling. Abdominal: Abdomen soft and non-tender.  No distension or HSM.   Neuro/Psych: Strength symmetric in upper and lower extremities.  Judgment and insight appear normal.   The results of significant diagnostics from this hospitalization (including imaging, microbiology, ancillary and laboratory) are listed below for reference.     Microbiology:  Recent Results (from the past 240 hour(s))  SARS CORONAVIRUS 2 (TAT 6-24 HRS) Nasopharyngeal Nasopharyngeal Swab     Status: None   Collection Time: 11/13/19  6:00 AM   Specimen: Nasopharyngeal Swab  Result Value Ref Range Status   SARS Coronavirus 2 NEGATIVE NEGATIVE Final    Comment: (NOTE) SARS-CoV-2 target nucleic acids are NOT DETECTED. The SARS-CoV-2 RNA is generally detectable in upper and lower respiratory specimens during the acute phase of infection. Negative results do not preclude SARS-CoV-2 infection, do not rule out co-infections with other pathogens, and should not be used as the sole basis for treatment or other patient management decisions. Negative results must be combined with clinical observations, patient history, and epidemiological information. The expected result is Negative. Fact Sheet for Patients: HairSlick.no Fact Sheet for Healthcare Providers: quierodirigir.com This test is not yet approved or cleared by the [place] FDA and  has been authorized for detection and/or diagnosis of SARS-CoV-2 by FDA under an Emergency Use Authorization (EUA). This EUA will remain  in effect (meaning this test can be used) for the duration of the COVID-19 declaration under Section 56 4(b)(1) of the Act, 21 U.S.C. section 360bbb-3(b)(1), unless the authorization is terminated or revoked sooner. Performed at  [facility], [address] [place], [place] [address]      Labs: BNP (last 3 results) Recent Labs    05/16/19 2129  BNP 260.0*   Basic Metabolic Panel: Recent Labs  Lab 11/13/19 0019  NA 136  K 3.7  CL 100  CO2 25  GLUCOSE 134*  BUN 12  CREATININE 0.95  CALCIUM 9.7   Liver Function Tests: Recent Labs  Lab 11/13/19 0019  AST 30  ALT 29  ALKPHOS 99  BILITOT 0.5  PROT 7.6  ALBUMIN 4.2   No results for input(s): LIPASE, AMYLASE in the last 168 hours. No results for input(s): AMMONIA in the last 168 hours. CBC: Recent Labs  Lab 11/13/19 0019  WBC 17.9*  NEUTROABS 14.3*  HGB 12.0  HCT 37.7  MCV 80.2  PLT 259   Cardiac Enzymes: No results for input(s): CKTOTAL, CKMB, CKMBINDEX, TROPONINI in the last 168 hours. BNP: Invalid input(s): POCBNP CBG: No results for input(s): GLUCAP in the last 168 hours. D-Dimer No results for input(s): DDIMER in the last 72 hours. Hgb A1c No results for input(s): HGBA1C in the last 72 hours. Lipid Profile No results for input(s): CHOL, HDL, LDLCALC, TRIG, CHOLHDL, LDLDIRECT in the last 72 hours. Thyroid function studies No results for input(s): TSH, T4TOTAL, T3FREE, THYROIDAB in the last 72 hours.  Invalid input(s): FREET3 Anemia work up No results for input(s): VITAMINB12, FOLATE, FERRITIN, TIBC, IRON, RETICCTPCT in the last 72 hours. Urinalysis    Component Value Date/Time   COLORURINE YELLOW (A) 11/13/2019 0242   APPEARANCEUR CLEAR (A) 11/13/2019 0242   LABSPEC 1.008 11/13/2019 0242   PHURINE 6.0 11/13/2019 0242   GLUCOSEU NEGATIVE 11/13/2019 0242   HGBUR NEGATIVE 11/13/2019 0242   BILIRUBINUR NEGATIVE 11/13/2019 0242   KETONESUR NEGATIVE 11/13/2019 0242   PROTEINUR NEGATIVE 11/13/2019 0242   UROBILINOGEN 0.2 10/15/2011 0840   NITRITE NEGATIVE 11/13/2019 0242   LEUKOCYTESUR NEGATIVE 11/13/2019 0242   Sepsis Labs Invalid input(s): PROCALCITONIN,  WBC,  LACTICIDVEN Microbiology Recent Results (from  the past 240 hour(s))  SARS CORONAVIRUS 2 (TAT 6-24 HRS) Nasopharyngeal Nasopharyngeal Swab     Status: None   Collection Time: 11/13/19  6:00 AM   Specimen: Nasopharyngeal Swab  Result Value  Ref Range Status   SARS Coronavirus 2 NEGATIVE NEGATIVE Final    Comment: (NOTE) SARS-CoV-2 target nucleic acids are NOT DETECTED. The SARS-CoV-2 RNA is generally detectable in upper and lower respiratory specimens during the acute phase of infection. Negative results do not preclude SARS-CoV-2 infection, do not rule out co-infections with other pathogens, and should not be used as the sole basis for treatment or other patient management decisions. Negative results must be combined with clinical observations, patient history, and epidemiological information. The expected result is Negative. Fact Sheet for Patients: HairSlick.no Fact Sheet for Healthcare Providers: quierodirigir.com This test is not yet approved or cleared by the [place] FDA and  has been authorized for detection and/or diagnosis of SARS-CoV-2 by FDA under an Emergency Use Authorization (EUA). This EUA will remain  in effect (meaning this test can be used) for the duration of the COVID-19 declaration under Section 56 4(b)(1) of the Act, 21 U.S.C. section 360bbb-3(b)(1), unless the authorization is terminated or revoked sooner. Performed at [facility], [address] [place], [place] [address]      Time coordinating discharge: 35 minutes The [place] controlled substances registry was reviewed for this patient prior to filling the <5 days supply controlled substances script.      SIGNED:   Alberteen Sam, MD  [facility] 11/14/2019, 1:41 PM

## 2020-03-19 ENCOUNTER — Emergency Department: Payer: Medicare Other

## 2020-03-19 ENCOUNTER — Other Ambulatory Visit: Payer: Self-pay

## 2020-03-19 ENCOUNTER — Encounter: Payer: Self-pay | Admitting: Emergency Medicine

## 2020-03-19 ENCOUNTER — Emergency Department
Admission: EM | Admit: 2020-03-19 | Discharge: 2020-03-19 | Disposition: A | Discharge status: Home / Self Care | Payer: Medicare Other | Attending: Emergency Medicine | Admitting: Emergency Medicine

## 2020-03-19 DIAGNOSIS — Y939 Activity, unspecified: Secondary | ICD-10-CM | POA: Insufficient documentation

## 2020-03-19 DIAGNOSIS — W19XXXA Unspecified fall, initial encounter: Secondary | ICD-10-CM | POA: Diagnosis not present

## 2020-03-19 DIAGNOSIS — J449 Chronic obstructive pulmonary disease, unspecified: Secondary | ICD-10-CM | POA: Diagnosis not present

## 2020-03-19 DIAGNOSIS — Z79899 Other long term (current) drug therapy: Secondary | ICD-10-CM | POA: Insufficient documentation

## 2020-03-19 DIAGNOSIS — Y999 Unspecified external cause status: Secondary | ICD-10-CM | POA: Insufficient documentation

## 2020-03-19 DIAGNOSIS — Y929 Unspecified place or not applicable: Secondary | ICD-10-CM | POA: Insufficient documentation

## 2020-03-19 DIAGNOSIS — I110 Hypertensive heart disease with heart failure: Secondary | ICD-10-CM | POA: Insufficient documentation

## 2020-03-19 DIAGNOSIS — M25552 Pain in left hip: Secondary | ICD-10-CM | POA: Insufficient documentation

## 2020-03-19 DIAGNOSIS — R52 Pain, unspecified: Secondary | ICD-10-CM

## 2020-03-19 DIAGNOSIS — I509 Heart failure, unspecified: Secondary | ICD-10-CM | POA: Insufficient documentation

## 2020-03-19 DIAGNOSIS — I2510 Atherosclerotic heart disease of native coronary artery without angina pectoris: Secondary | ICD-10-CM | POA: Insufficient documentation

## 2020-03-19 DIAGNOSIS — F17210 Nicotine dependence, cigarettes, uncomplicated: Secondary | ICD-10-CM | POA: Diagnosis not present

## 2020-03-19 MED ORDER — OXYCODONE-ACETAMINOPHEN 5-325 MG PO TABS: 1.0000 | ORAL_TABLET | Freq: Four times a day (QID) | ORAL | 0 refills | Status: AC | PRN

## 2020-03-19 MED ORDER — TRAMADOL HCL 50 MG PO TABS
50.0000 mg | ORAL_TABLET | Freq: Once | ORAL | Status: AC
  Administered 2020-03-19: 50 mg via ORAL
  Filled 2020-03-19: qty 1

## 2020-03-19 MED ORDER — LIDOCAINE 5 % EX PTCH
1.0000 | MEDICATED_PATCH | CUTANEOUS | Status: DC
  Administered 2020-03-19: 1 via TRANSDERMAL
  Filled 2020-03-19: qty 1

## 2020-03-19 NOTE — ED Notes (Signed)
See triage note  Presents with left hip pain  States she fell on saturday  Landed on left side  Having pain to hip with some swelling

## 2020-03-19 NOTE — ED Provider Notes (Signed)
[facility] Emergency Department Provider Note   ____________________________________________   First MD Initiated Contact with Patient 03/19/20 1442     (approximate)  I have reviewed the triage vital signs and the nursing notes.   HISTORY  Chief Complaint Hip Pain    HPI Traci Duran is a 64 y.o. female patient complains of of the left hip, pelvic, and back pain secondary to mechanical fall 2 days ago. Patient denies radicular component to her back pain. Patient denies bladder or bowel dysfunction. Patient states pain is not relieved with over-the-counter Tylenol. Rates pain as 8/10. Described pain as "achy". Patient history of right hip fracture requiring prosthesis.         Past Medical History:  Diagnosis Date  . Anemia   . Anxiety   . CHF (congestive heart failure) ([place])   . COPD (chronic obstructive pulmonary disease) ([place])   . Coronary artery disease   . Depression   . Gastritis   . GERD (gastroesophageal reflux disease)   . Hemorrhoids   . Hyperlipidemia   . Hypertension   . Myocardial infarction ([place])   . PAD (peripheral artery disease) [facility])     Patient Active Problem List   Diagnosis Date Noted  . Trochanteric fracture of right femur ([place]) 11/13/2019  . Occult GI bleeding   . Symptomatic anemia 10/06/2019  . Acute CHF (congestive heart failure) ([place]) 05/16/2019  . Rectal bleed 11/19/2018  . 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 Benzodiazepine abuse ([place]) 09/03/2018  . Moderate recurrent major depression ([place]) 09/03/2018  . Hyponatremia 10/11/2017  . Chest pain 07/28/2017  . Acute blood loss anemia 07/28/2017  . Demand ischemia ([place])   . Benign neoplasm of descending colon   . Polyp of sigmoid colon   . Age related osteoporosis 04/16/2017  . Iron deficiency anemia secondary to blood loss (chronic)   . GI bleed 04/06/2017  . Diastolic dysfunction [id number]  . Opioid contract exists 06/27/2016  .  Incidental lung nodule 01/18/2016  . Traumatic hemorrhage of cerebrum ([place]) 12/04/2015  . Hypovitaminosis D 08/29/2015  . Thyroid lesion 08/29/2015  . Major depressive disorder, single episode, unspecified 08/27/2015  . 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 Gastroesophageal reflux disease 01/26/2015  . Osteopenia 01/26/2015  . Anemia, unspecified 12/27/2014  . History of fall 12/27/2014  . Greater tuberosity of humerus fracture 10/24/2013  . AVF (arteriovenous fistula) ([place]) 11/08/2011  . Anxiety 10/24/2011  . Cerebrovascular disease 10/24/2011  . Dyslipidemia 10/24/2011  . Hypertension 10/24/2011  . Tobacco abuse 10/24/2011  . Palpitations 10/24/2011  . Renal artery stenosis ([place]) 10/24/2011  . Unstable angina ([place]) 10/14/2011  . Hyperlipidemia   . Chronic obstructive pulmonary disease ([place])   . Depression   . Peripheral arterial disease ([place])   . Coronary artery disease, non-occlusive     Past Surgical History:  Procedure Laterality Date  . APPENDECTOMY    . COLONOSCOPY WITH PROPOFOL N/A 06/09/2017   Procedure: COLONOSCOPY WITH PROPOFOL;  Surgeon: Lucilla Lame, MD;  Location: [facility] ENDOSCOPY;  Service: Endoscopy;  Laterality: N/A;  . COLONOSCOPY WITH PROPOFOL N/A 10/08/2019   Procedure: COLONOSCOPY WITH PROPOFOL;  Surgeon: Lucilla Lame, MD;  Location[facility] ENDOSCOPY;  Service: Endoscopy;  Laterality: N/A;  . ENTEROSCOPY N/A 10/08/2019   Procedure: ENTEROSCOPY;  Surgeon: Lucilla Lame, MD;  Location: [facility] ENDOSCOPY;  Service: Endoscopy;  Laterality: N/A;  . ESOPHAGOGASTRODUODENOSCOPY N/A 07/30/2017   Procedure: ESOPHAGOGASTRODUODENOSCOPY (EGD);  Surgeon: Lin Landsman, MD;  Location: [facility] ENDOSCOPY;  Service: Gastroenterology;  Laterality: N/A;  . ESOPHAGOGASTRODUODENOSCOPY (EGD) WITH PROPOFOL N/A 04/09/2017   Procedure: ESOPHAGOGASTRODUODENOSCOPY (EGD) WITH PROPOFOL;  Surgeon: Lucilla Lame, MD;  Location: [facility] ENDOSCOPY;  Service: Endoscopy;  Laterality: N/A;  . GIVENS  CAPSULE STUDY N/A 10/08/2019   Procedure: GIVENS CAPSULE STUDY;  Surgeon: Lucilla Lame, MD;  Location: [facility] ENDOSCOPY;  Service: Endoscopy;  Laterality: N/A;  . HIP FRACTURE SURGERY      Prior to Admission medications   Medication Sig Start Date End Date Taking? Authorizing Provider  acetaminophen (TYLENOL) 325 MG tablet Take 3 tablets (975 mg total) by mouth 3 (three) times daily as needed. 02/11/19   Carrie Mew, MD  albuterol (PROVENTIL) (2.5 MG/3ML) 0.083% nebulizer solution Take 3 mLs (2.5 mg total) by nebulization every 6 (six) hours as needed for wheezing or shortness of breath. 05/18/19   Mayo, Pete Pelt, MD  alprazolam Duanne Moron) 2 MG tablet Take 2 mg by mouth 2 (two) times daily.     [provider]  atorvastatin (LIPITOR) 80 MG tablet Take 80 mg by mouth daily.    [provider]  busPIRone (BUSPAR) 10 MG tablet Take 10 mg by mouth 2 (two) times daily.     [provider]  carvedilol (COREG) 6.25 MG tablet Take 6.25 mg by mouth 2 (two) times daily. 05/28/17   [provider]  citalopram (CELEXA) 40 MG tablet Take 40 mg by mouth daily.    [provider]  DULoxetine (CYMBALTA) 30 MG capsule Take 30 mg by mouth daily. 05/28/17   [provider]  furosemide (LASIX) 40 MG tablet Take 1 tablet (40 mg total) by mouth daily. 05/19/19   Lang Snow, NP  gabapentin (NEURONTIN) 300 MG capsule Take 300 mg by mouth 2 (two) times daily.     [provider]  ipratropium-albuterol (DUONEB) 0.5-2.5 (3) MG/3ML SOLN Take 3 mLs by nebulization every 6 (six) hours as needed. 05/18/19   Lang Snow, NP  lisinopril (PRINIVIL,ZESTRIL) 20 MG tablet Take 20 mg by mouth daily.      [provider]  nitroGLYCERIN (NITROSTAT) 0.4 MG SL tablet Place 0.4 mg under the tongue as needed. 10/06/19   [provider]  oxyCODONE 10 MG TABS Take 1 tablet (10 mg total) by mouth every 8 (eight) hours as needed for  breakthrough pain. 11/14/19   Danford, Suann Larry, MD  oxyCODONE-acetaminophen (PERCOCET) 5-325 MG tablet Take 1 tablet by mouth every 6 (six) hours as needed for up to 5 days for severe pain. 03/19/20 03/24/20  Sable Feil, PA-C  pantoprazole (PROTONIX) 40 MG tablet Take 1 tablet (40 mg total) by mouth 2 (two) times daily before a meal. 10/09/19   Max Sane, MD  SYMBICORT 80-4.5 MCG/ACT inhaler Inhale 2 puffs into the lungs 2 (two) times daily. 08/12/18   [provider]  Vitamin D, Ergocalciferol, (DRISDOL) 50000 units CAPS capsule Take 50,000 Units by mouth every Saturday.  05/01/17   [provider]    Allergies Chantix [varenicline], Diphenhydramine hcl, Potassium, Benadryl [diphenhydramine hcl], Niacin, and Trazodone  Family History  Problem Relation Age of Onset  . Heart failure Mother   . Heart attack Mother        mother died at 108 of an MI  . Heart attack Father        father died at 66 of an MI  . Heart attack Brother        Brother had an MI in his 1s  Social History Social History   Tobacco Use  . Smoking status: Current Some Day Smoker    Packs/day: 1.00    Years: 46.00    Pack years: 46.00    Types: Cigarettes  . Smokeless tobacco: Never Used  . Tobacco comment: she feels that the 21 nicotine made her jittery and we suggested a lower level patch  Substance Use Topics  . Alcohol use: No  . Drug use: Yes    Types: Marijuana    Comment: monthly    Review of Systems Constitutional: No fever/chills Eyes: No visual changes. ENT: No sore throat. Cardiovascular: Denies chest pain. Respiratory: Denies shortness of breath. Gastrointestinal: No abdominal pain.  No nausea, no vomiting.  No diarrhea.  No constipation. Genitourinary: Negative for dysuria. Musculoskeletal: Negative for back pain. Skin: Negative for rash. Neurological: Negative for headaches, focal weakness or numbness. Psychiatric:  Anxiety and depression. Endocrine:   Hyperlipidemia hypertension Allergic/Immunilogical: Chantix, Benadryl, niacin, and trazodone. ____________________________________________   PHYSICAL EXAM:  VITAL SIGNS: ED Triage Vitals  Enc Vitals Group     BP 03/19/20 1316 109/81     Pulse Rate 03/19/20 1316 65     Resp 03/19/20 1316 16     Temp 03/19/20 1316 97.8 F (36.6 C)     Temp Source 03/19/20 1316 Oral     SpO2 03/19/20 1316 97 %     Weight 03/19/20 1315 138 lb 14.2 oz (63 kg)     Height 03/19/20 1315 5\' 7"  (1.702 m)     Head Circumference --      Peak Flow --      Pain Score 03/19/20 1314 8     Pain Loc --      Pain Edu? --      Excl. in [place]? --    Constitutional: Alert and oriented. Well appearing and in no acute distress. Cardiovascular: Normal rate, regular rhythm. Grossly normal heart sounds.  Good peripheral circulation. Respiratory: Normal respiratory effort.  No retractions. Lungs CTAB. Gastrointestinal: Soft and nontender. No distention. No abdominal bruits. No CVA tenderness. Genitourinary: Deferred Musculoskeletal: No obvious deformity to the left hip.  Patient is moderate guarding with palpation greater trochanter area.  No lower extremity tenderness nor edema.  No joint effusions. Neurologic:  Normal speech and language. No gross focal neurologic deficits are appreciated. No gait instability. Skin:  Skin is warm, dry and intact. No rash noted.  No ecchymosis or abrasions. Psychiatric: Mood and affect are normal. Speech and behavior are normal.  ____________________________________________   LABS (all labs ordered are listed, but only abnormal results are displayed)  Labs Reviewed - No data to display ____________________________________________  EKG   ____________________________________________  RADIOLOGY  ED MD interpretation:    Official radiology report(s): DG Pelvis 1-2 Views  Result Date: 03/19/2020 CLINICAL DATA:  [place] 2 days ago, left hip pain, history of intramedullary rod EXAM:  PELVIS - 1-2 VIEW COMPARISON:  11/13/2019 FINDINGS: Single frontal view of the pelvis demonstrates stable position of the intramedullary rod and proximal dynamic screws within the left femur. No acute displaced fracture. Joint spaces are relatively well preserved. Stable vascular calcifications. Sacroiliac joints are normal. IMPRESSION: 1. No acute bony abnormality. 2. Stable postsurgical changes left hip. Electronically Signed   By: Randa Ngo M.D.   On: 03/19/2020 15:31   DG FEMUR MIN 2 VIEWS LEFT  Result Date: 03/19/2020 CLINICAL DATA:  [place] 2 days ago, left hip pain, history of left femoral intramedullary rod EXAM: LEFT FEMUR 2 VIEWS  COMPARISON:  10/10/2017, 11/12/2019 FINDINGS: Frontal and lateral views of the left femur are obtained. Intramedullary rod is identified, with proximal dynamic screws and distal interlocking screw unchanged. No evidence of metallic hardware failure or loosening. No acute displaced fracture. The left hip and knee are well aligned. Extensive vascular calcifications are noted. Otherwise soft tissues are unremarkable. IMPRESSION: 1. Stable hardware left femur. 2. No acute bony abnormality. Electronically Signed   By: Randa Ngo M.D.   On: 03/19/2020 15:32    ____________________________________________   PROCEDURES  Procedure(s) performed (including Critical Care):  Procedures   ____________________________________________   INITIAL IMPRESSION / ASSESSMENT AND PLAN / ED COURSE  As part of my medical decision making, I reviewed the following data within the [facility]     Patient presents with left hip pain secondary to fall 2 days ago.  Discussed no acute findings on x-ray of the hip and pelvic.  Patient physical finding consistent with contusion secondary to fall.  Patient given discharge care instruction advised take medication as directed.  Patient advised follow-up PCP.    Traci Duran was evaluated in Emergency Department on  03/19/2020 for the symptoms described in the history of present illness. She was evaluated in the context of the global COVID-19 pandemic, which necessitated consideration that the patient might be at risk for infection with the SARS-CoV-2 virus that causes COVID-19. Institutional protocols and algorithms that pertain to the evaluation of patients at risk for COVID-19 are in a state of rapid change based on information released by regulatory bodies including the CDC and federal and state organizations. These policies and algorithms were followed during the patient's care in the ED.       ____________________________________________   FINAL CLINICAL IMPRESSION(S) / ED DIAGNOSES  Final diagnoses:  Left hip pain  Fall, initial encounter     ED Discharge Orders         Ordered    oxyCODONE-acetaminophen (PERCOCET) 5-325 MG tablet  Every 6 hours PRN     03/19/20 1541           Note:  This document was prepared using Dragon voice recognition software and may include unintentional dictation errors.    Sable Feil, PA-C 03/19/20 1544    Harvest Dark, MD 03/20/20 2030

## 2020-03-19 NOTE — ED Triage Notes (Signed)
Fell on Saturday.  Patient states has a rod to left upper leg.  C/O left hip, left lower back, left groin and left leg pain.  States fall was a mechanical fall.  Ambulated in to Triage with cane.

## 2020-03-19 NOTE — Discharge Instructions (Signed)
Follow discharge care instruction take medication as directed.  Be advised medication may cause drowsiness. 

## 2020-04-04 ENCOUNTER — Encounter: Payer: Self-pay | Admitting: *Deleted

## 2020-04-04 ENCOUNTER — Other Ambulatory Visit: Payer: Self-pay

## 2020-04-04 ENCOUNTER — Emergency Department: Payer: Medicare Other

## 2020-04-04 ENCOUNTER — Emergency Department
Admission: EM | Admit: 2020-04-04 | Discharge: 2020-04-04 | Disposition: A | Discharge status: Home / Self Care | Payer: Medicare Other | Attending: Emergency Medicine | Admitting: Emergency Medicine

## 2020-04-04 DIAGNOSIS — Z79899 Other long term (current) drug therapy: Secondary | ICD-10-CM | POA: Insufficient documentation

## 2020-04-04 DIAGNOSIS — F17210 Nicotine dependence, cigarettes, uncomplicated: Secondary | ICD-10-CM | POA: Diagnosis not present

## 2020-04-04 DIAGNOSIS — I110 Hypertensive heart disease with heart failure: Secondary | ICD-10-CM | POA: Diagnosis not present

## 2020-04-04 DIAGNOSIS — I509 Heart failure, unspecified: Secondary | ICD-10-CM | POA: Diagnosis not present

## 2020-04-04 DIAGNOSIS — F1210 Cannabis abuse, uncomplicated: Secondary | ICD-10-CM | POA: Diagnosis not present

## 2020-04-04 DIAGNOSIS — K0889 Other specified disorders of teeth and supporting structures: Secondary | ICD-10-CM | POA: Insufficient documentation

## 2020-04-04 DIAGNOSIS — J449 Chronic obstructive pulmonary disease, unspecified: Secondary | ICD-10-CM | POA: Insufficient documentation

## 2020-04-04 DIAGNOSIS — R0789 Other chest pain: Secondary | ICD-10-CM | POA: Insufficient documentation

## 2020-04-04 LAB — CBC
HCT: 31.6 % — ABNORMAL LOW (ref 36.0–46.0)
Hemoglobin: 10.5 g/dL — ABNORMAL LOW (ref 12.0–15.0)
MCH: 29.2 pg (ref 26.0–34.0)
MCHC: 33.2 g/dL (ref 30.0–36.0)
MCV: 87.8 fL (ref 80.0–100.0)
Platelets: 277 10*3/uL (ref 150–400)
RBC: 3.6 MIL/uL — ABNORMAL LOW (ref 3.87–5.11)
RDW: 13.6 % (ref 11.5–15.5)
WBC: 10.3 10*3/uL (ref 4.0–10.5)
nRBC: 0 % (ref 0.0–0.2)

## 2020-04-04 LAB — BASIC METABOLIC PANEL
Anion gap: 8 (ref 5–15)
BUN: 15 mg/dL (ref 8–23)
CO2: 24 mmol/L (ref 22–32)
Calcium: 8.7 mg/dL — ABNORMAL LOW (ref 8.9–10.3)
Chloride: 100 mmol/L (ref 98–111)
Creatinine, Ser: 0.99 mg/dL (ref 0.44–1.00)
GFR calc Af Amer: >60 mL/min (ref >60)
GFR calc non Af Amer: >60 mL/min (ref >60)
Glucose, Bld: 88 mg/dL (ref 70–99)
Potassium: 3.6 mmol/L (ref 3.5–5.1)
Sodium: 132 mmol/L — ABNORMAL LOW (ref 135–145)

## 2020-04-04 LAB — TROPONIN I (HIGH SENSITIVITY)
Troponin I (High Sensitivity): 3 ng/L (ref <18)
Troponin I (High Sensitivity): 3 ng/L (ref <18)

## 2020-04-04 MED ORDER — TRAMADOL HCL 50 MG PO TABS
50.0000 mg | ORAL_TABLET | Freq: Once | ORAL | Status: AC
  Administered 2020-04-04: 08:00:00 50 mg via ORAL
  Filled 2020-04-04: qty 1

## 2020-04-04 MED ORDER — SODIUM CHLORIDE 0.9% FLUSH: 3.0000 mL | Freq: Once | INTRAVENOUS | Status: DC

## 2020-04-04 NOTE — ED Notes (Signed)
This RN to bedside to administer tramadol and D/C patient. Pt states "that's not going to work for me, I don't want it". This RN explained that's what EDP ordered for pain, pt states "can you see if he will give me oxycodone that's what I want". This RN explained will speak with EDP regarding pain medication prior to discharge. VSS at this time. Pt visualized in NAD upon arrival to bedside.

## 2020-04-04 NOTE — ED Notes (Signed)
Pt repeatedly asking for narcotics for her dental pain, this RN explained per EDP against policy to write narcotics for dental pain, pt initially refused tramadol, then states, "I'll take it, I'll take it". NAD noted at time of D/C. Pt taken to lobby to wait for cab via wheelchair. Upon arrival to lobby pt states, "he didn't even write me a prescription for pain medication?" This RN explained again to patient would need to follow up with dentist for dental pain and cardiologist for chest pain and that no narcotic prescriptions were written for dental pain. Pt then ambulated out of ED waiting room to cafeteria, pt ambulatory with steady gait with a cane. Pt visualized in NAD at this time.

## 2020-04-04 NOTE — ED Notes (Signed)
Po meds given for dental pain. Patient discharged to home with follow up instructions.

## 2020-04-04 NOTE — ED Triage Notes (Signed)
Pt brought in via ems from home.  Pt has left lower toothache and  Left side chest pain.  Sx began tonight.  No n/v   No sob. Pt alert  Speech clear.  Iv in place

## 2020-04-04 NOTE — ED Notes (Signed)
Pt st since yesterday having pain beneath left breast accomp by dizziness; also reports left lower dental pain x 2wks; pt assisted into hosp gown & on card monitor for further eval

## 2020-04-04 NOTE — ED Provider Notes (Signed)
[facility] Emergency Department Provider Note   ____________________________________________    I have reviewed the triage vital signs and the nursing notes.   HISTORY  Chief Complaint Dental pain and chest pain    HPI Traci Duran is a 64 y.o. female with a history as noted below who presented by EMS with complaints of dental pain.  Patient reports she has had significant pain in tooth 19 for several days.  Was recently prescribed amoxicillin which she reports she is taken one or two doses of but continues to have pain.  She has dental follow-up in 2 days.  She reports she has been taking Tylenol but it is not helping.  She is here for tooth pain relief.  She additionally started to complain of left-sided chest pain which she noticed while rubbing cream underneath her left breast.  She does not have any pain currently.  No radiation.  No back pain.  No nausea or vomiting.  Past Medical History:  Diagnosis Date  . Anemia   . Anxiety   . CHF (congestive heart failure) ([place])   . COPD (chronic obstructive pulmonary disease) ([place])   . Coronary artery disease   . Depression   . Gastritis   . GERD (gastroesophageal reflux disease)   . Hemorrhoids   . Hyperlipidemia   . Hypertension   . Myocardial infarction ([place])   . PAD (peripheral artery disease) [facility])     Patient Active Problem List   Diagnosis Date Noted  . Trochanteric fracture of right femur ([place]) 11/13/2019  . Occult GI bleeding   . Symptomatic anemia 10/06/2019  . Acute CHF (congestive heart failure) ([place]) 05/16/2019  . Rectal bleed 11/19/2018  . 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 Benzodiazepine abuse ([place]) 09/03/2018  . Moderate recurrent major depression ([place]) 09/03/2018  . Hyponatremia 10/11/2017  . Chest pain 07/28/2017  . Acute blood loss anemia 07/28/2017  . Demand ischemia ([place])   . Benign neoplasm of descending colon   . Polyp of sigmoid colon   . Age  related osteoporosis 04/16/2017  . Iron deficiency anemia secondary to blood loss (chronic)   . GI bleed 04/06/2017  . Diastolic dysfunction [id number]  . Opioid contract exists 06/27/2016  . Incidental lung nodule 01/18/2016  . 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 Hypovitaminosis D 08/29/2015  . Thyroid lesion 08/29/2015  . Major depressive disorder, single episode, unspecified 08/27/2015  . 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 Gastroesophageal reflux disease 01/26/2015  . Osteopenia 01/26/2015  . Anemia, unspecified 12/27/2014  . History of fall 12/27/2014  . Greater tuberosity of humerus fracture 10/24/2013  . AVF (arteriovenous fistula) ([place]) 11/08/2011  . Anxiety 10/24/2011  . Cerebrovascular disease 10/24/2011  . Dyslipidemia 10/24/2011  . Hypertension 10/24/2011  . Tobacco abuse 10/24/2011  . Palpitations 10/24/2011  . Renal artery stenosis ([place]) 10/24/2011  . Unstable angina ([place]) 10/14/2011  . Hyperlipidemia   . Chronic obstructive pulmonary disease ([place])   . Depression   . Peripheral arterial disease ([place])   . Coronary artery disease, non-occlusive     Past Surgical History:  Procedure Laterality Date  . APPENDECTOMY    . COLONOSCOPY WITH PROPOFOL N/A 06/09/2017   Procedure: COLONOSCOPY WITH PROPOFOL;  Surgeon: Lucilla Lame, MD;  Location: [facility] ENDOSCOPY;  Service: Endoscopy;  Laterality: N/A;  . COLONOSCOPY WITH PROPOFOL N/A 10/08/2019   Procedure: COLONOSCOPY WITH PROPOFOL;  Surgeon: Lucilla Lame, MD;  Location: [facility] ENDOSCOPY;  Service: Endoscopy;  Laterality: N/A;  . ENTEROSCOPY N/A 10/08/2019   Procedure: ENTEROSCOPY;  Surgeon: Lucilla Lame, MD;  Location: [facility] ENDOSCOPY;  Service: Endoscopy;  Laterality: N/A;  . ESOPHAGOGASTRODUODENOSCOPY N/A 07/30/2017   Procedure: ESOPHAGOGASTRODUODENOSCOPY (EGD);  Surgeon: Lin Landsman, MD;  Location: [facility] ENDOSCOPY;  Service: Gastroenterology;  Laterality: N/A;  . ESOPHAGOGASTRODUODENOSCOPY  (EGD) WITH PROPOFOL N/A 04/09/2017   Procedure: ESOPHAGOGASTRODUODENOSCOPY (EGD) WITH PROPOFOL;  Surgeon: Lucilla Lame, MD;  Location: [facility] ENDOSCOPY;  Service: Endoscopy;  Laterality: N/A;  . GIVENS CAPSULE STUDY N/A 10/08/2019   Procedure: GIVENS CAPSULE STUDY;  Surgeon: Lucilla Lame, MD;  Location[facility] ENDOSCOPY;  Service: Endoscopy;  Laterality: N/A;  . HIP FRACTURE SURGERY      Prior to Admission medications   Medication Sig Start Date End Date Taking? Authorizing Provider  acetaminophen (TYLENOL) 325 MG tablet Take 3 tablets (975 mg total) by mouth 3 (three) times daily as needed. 02/11/19   Carrie Mew, MD  albuterol (PROVENTIL) (2.5 MG/3ML) 0.083% nebulizer solution Take 3 mLs (2.5 mg total) by nebulization every 6 (six) hours as needed for wheezing or shortness of breath. 05/18/19   Mayo, Pete Pelt, MD  alprazolam Duanne Moron) 2 MG tablet Take 2 mg by mouth 2 (two) times daily.     [provider]  atorvastatin (LIPITOR) 80 MG tablet Take 80 mg by mouth daily.    [provider]  busPIRone (BUSPAR) 10 MG tablet Take 10 mg by mouth 2 (two) times daily.     [provider]  carvedilol (COREG) 6.25 MG tablet Take 6.25 mg by mouth 2 (two) times daily. 05/28/17   [provider]  citalopram (CELEXA) 40 MG tablet Take 40 mg by mouth daily.    [provider]  DULoxetine (CYMBALTA) 30 MG capsule Take 30 mg by mouth daily. 05/28/17   [provider]  furosemide (LASIX) 40 MG tablet Take 1 tablet (40 mg total) by mouth daily. 05/19/19   Lang Snow, NP  gabapentin (NEURONTIN) 300 MG capsule Take 300 mg by mouth 2 (two) times daily.     [provider]  ipratropium-albuterol (DUONEB) 0.5-2.5 (3) MG/3ML SOLN Take 3 mLs by nebulization every 6 (six) hours as needed. 05/18/19   Lang Snow, NP  lisinopril (PRINIVIL,ZESTRIL) 20 MG tablet Take 20 mg by mouth daily.      [provider]  nitroGLYCERIN (NITROSTAT)  0.4 MG SL tablet Place 0.4 mg under the tongue as needed. 10/06/19   [provider]  oxyCODONE 10 MG TABS Take 1 tablet (10 mg total) by mouth every 8 (eight) hours as needed for breakthrough pain. 11/14/19   Danford, Suann Larry, MD  pantoprazole (PROTONIX) 40 MG tablet Take 1 tablet (40 mg total) by mouth 2 (two) times daily before a meal. 10/09/19   Max Sane, MD  SYMBICORT 80-4.5 MCG/ACT inhaler Inhale 2 puffs into the lungs 2 (two) times daily. 08/12/18   [provider]  Vitamin D, Ergocalciferol, (DRISDOL) 50000 units CAPS capsule Take 50,000 Units by mouth every Saturday.  05/01/17   [provider]     Allergies Chantix [varenicline], Diphenhydramine hcl, Potassium, Benadryl [diphenhydramine hcl], Niacin, and Trazodone  Family History  Problem Relation Age of Onset  . Heart failure Mother   . Heart attack Mother        mother died at 16 of an MI  . Heart attack Father        father died at 11 of an MI  . Heart attack  Brother        Brother had an MI in his 60s    Social History Social History   Tobacco Use  . Smoking status: Current Some Day Smoker    Packs/day: 1.00    Years: 46.00    Pack years: 46.00    Types: Cigarettes  . Smokeless tobacco: Never Used  . Tobacco comment: she feels that the 21 nicotine made her jittery and we suggested a lower level patch  Substance Use Topics  . Alcohol use: No  . Drug use: Yes    Types: Marijuana    Comment: monthly    Review of Systems  Constitutional: No fever/chills Eyes: No visual changes.  ENT: As above Cardiovascular: As above Respiratory: Denies shortness of breath. Gastrointestinal: No abdominal pain.  No nausea, no vomiting.   Genitourinary: Negative for dysuria. Musculoskeletal: Negative for back pain. Skin: Negative for rash. Neurological: Negative for headaches    ____________________________________________   PHYSICAL EXAM:  VITAL SIGNS: ED Triage Vitals  Enc Vitals  Group     BP 04/04/20 0233 139/83     Pulse Rate 04/04/20 0233 63     Resp 04/04/20 0233 18     Temp 04/04/20 0233 97.6 F (36.4 C)     Temp Source 04/04/20 0233 Oral     SpO2 04/04/20 0233 100 %     Weight 04/04/20 0234 61.2 kg (135 lb)     Height 04/04/20 0234 1.702 m (5\' 7" )     Head Circumference --      Peak Flow --      Pain Score 04/04/20 0233 10     Pain Loc --      Pain Edu? --      Excl. in [place]? --     Constitutional: Alert and oriented.  Eyes: Conjunctivae are normal.  Head: Atraumatic. Nose: No congestion/rhinnorhea. Mouth/Throat: Mucous membranes are moist.  Lower molars and premolars examined, likely dental infection but no evidence of abscess, pharynx normal, no evidence of Ludwig's Neck:  Painless ROM Cardiovascular: Normal rate, regular rhythm. Grossly normal heart sounds.  Good peripheral circulation.  No tenderness to palpation of the chest wall Respiratory: Normal respiratory effort.  No retractions. Gastrointestinal: Soft and nontender. No distention.    Musculoskeletal: Warm and well perfused Neurologic:  Normal speech and language. No gross focal neurologic deficits are appreciated.  Skin:  Skin is warm, dry and intact. No rash noted. Psychiatric: Mood and affect are normal. Speech and behavior are normal.  ____________________________________________   LABS (all labs ordered are listed, but only abnormal results are displayed)  Labs Reviewed  BASIC METABOLIC PANEL - Abnormal; Notable for the following components:      Result Value   Sodium 132 (*)    Calcium 8.7 (*)    All other components within normal limits  CBC - Abnormal; Notable for the following components:   RBC 3.60 (*)    Hemoglobin 10.5 (*)    HCT 31.6 (*)    All other components within normal limits  TROPONIN I (HIGH SENSITIVITY)  TROPONIN I (HIGH SENSITIVITY)   ____________________________________________  EKG  ED ECG REPORT I, Lavonia Drafts, the attending physician,  personally viewed and interpreted this ECG.  Date: 04/04/2020  Rhythm: normal sinus rhythm QRS Axis: normal Intervals: normal ST/T Wave abnormalities: normal Narrative Interpretation: no evidence of acute ischemia  ____________________________________________  RADIOLOGY  Chest x-ray reviewed by me, no pneumothorax or pneumonia ____________________________________________   PROCEDURES  Procedure(s) performed: No  Procedures   Critical Care performed: No ____________________________________________   INITIAL IMPRESSION / ASSESSMENT AND PLAN / ED COURSE  Pertinent labs & imaging results that were available during my care of the patient were reviewed by me and considered in my medical decision making (see chart for details).  Patient presents with primary complaint of dental pain, no evidence of abscess.  Additionally she complained of chest pain as noted above.  Low concern for ACS, PE or aortic dissection given description.  Labs performed and were overall quite reassuring, cardiac enzymes were negative x2, chest x-ray reassuring no pneumothorax or pneumonia.  Will provide tramadol for dental pain, recommend close follow-up with dentistry  Patient upset that we are not providing oxycodone, noted to patient that we do not provide narcotics for dental pain    ____________________________________________   FINAL CLINICAL IMPRESSION(S) / ED DIAGNOSES  Final diagnoses:  Atypical chest pain  Pain, dental        Note:  This document was prepared using Dragon voice recognition software and may include unintentional dictation errors.   Lavonia Drafts, MD 04/04/20 [phone]

## 2020-05-03 ENCOUNTER — Ambulatory Visit (INDEPENDENT_AMBULATORY_CARE_PROVIDER_SITE_OTHER): Payer: Medicare Other | Admitting: Cardiology

## 2020-05-03 ENCOUNTER — Other Ambulatory Visit: Payer: Self-pay

## 2020-05-03 ENCOUNTER — Encounter: Payer: Self-pay | Admitting: Cardiology

## 2020-05-03 VITALS — BP 130/70 | HR 61 | Ht 67.0 in | Wt 136.4 lb

## 2020-05-03 DIAGNOSIS — I739 Peripheral vascular disease, unspecified: Secondary | ICD-10-CM | POA: Diagnosis not present

## 2020-05-03 DIAGNOSIS — F17200 Nicotine dependence, unspecified, uncomplicated: Secondary | ICD-10-CM | POA: Diagnosis not present

## 2020-05-03 DIAGNOSIS — R079 Chest pain, unspecified: Secondary | ICD-10-CM | POA: Diagnosis not present

## 2020-05-03 NOTE — Progress Notes (Signed)
Cardiology Office Note:    Date:  05/03/2020   ID:  Traci Duran, DOB [birth year], MRN [id number]  PCP:  [name]  Cardiologist:  No primary care provider on file.  Electrophysiologist:  None   Referring MD: Jene Every, MD   Chief Complaint  Patient presents with  . New Patient (Initial Visit)    ED F/U for chest pain-Patient aslo reports SOB; Meds verbally reviewed with patient.    History of Present Illness:    Traci Duran is a 64 y.o. female with a hx of anxiety, COPD, CAD, PAD, current smoker x40+ years, who presents as a follow-up from the emergency department due to chest pain.  Patient seen in the ED on 04/04/2020 with chest pain and dental pain.  EKG was normal, with no evidence of ischemia, high-sensitivity troponins were normal x2.  Symptoms were deemed atypical likely due to dental pain. She was given a dose of pain medicines and discharged. She eventually had the tooth removed with resolution of symptoms. Echocardiogram on 05/2019 showed normal systolic function with moderate pulmonary hypertension, EF 55 to 60%.  Had a Myoview stress test on 08/2018 showing ischemia. Invasive work-up not planned due to history of anemia/GI bleeds.  Patient has a history of GI bleed requiring transfusions about a year ago. 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 She has never undergone work-up for this. She was advised by PCP not to take NSAIDs for pain due to bleeding issues. She also endorses symptoms of calf and leg pain with walking. Has some chest tightness with exertion which has been ongoing for a year.  Past Medical History:  Diagnosis Date  . Anemia   . Anxiety   . CHF (congestive heart failure) (HCC)   . COPD (chronic obstructive pulmonary disease) (HCC)   . Coronary artery disease   . Depression   . Gastritis   . GERD (gastroesophageal reflux disease)   . Hemorrhoids   . Hyperlipidemia   .  Hypertension   . Myocardial infarction (HCC)   . PAD (peripheral artery disease) (HCC)     Past Surgical History:  Procedure Laterality Date  . APPENDECTOMY    . COLONOSCOPY WITH PROPOFOL N/A 06/09/2017   Procedure: COLONOSCOPY WITH PROPOFOL;  Surgeon: Midge Minium, MD;  Location: [facility] ENDOSCOPY;  Service: Endoscopy;  Laterality: N/A;  . COLONOSCOPY WITH PROPOFOL N/A 10/08/2019   Procedure: COLONOSCOPY WITH PROPOFOL;  Surgeon: Midge Minium, MD;  Location: [facility] ENDOSCOPY;  Service: Endoscopy;  Laterality: N/A;  . ENTEROSCOPY N/A 10/08/2019   Procedure: ENTEROSCOPY;  Surgeon: Midge Minium, MD;  Location: [facility] ENDOSCOPY;  Service: Endoscopy;  Laterality: N/A;  . ESOPHAGOGASTRODUODENOSCOPY N/A 07/30/2017   Procedure: ESOPHAGOGASTRODUODENOSCOPY (EGD);  Surgeon: Toney Reil, MD;  Location[facility] ENDOSCOPY;  Service: Gastroenterology;  Laterality: N/A;  . ESOPHAGOGASTRODUODENOSCOPY (EGD) WITH PROPOFOL N/A 04/09/2017   Procedure: ESOPHAGOGASTRODUODENOSCOPY (EGD) WITH PROPOFOL;  Surgeon: Midge Minium, MD;  Location: [facility] ENDOSCOPY;  Service: Endoscopy;  Laterality: N/A;  . GIVENS CAPSULE STUDY N/A 10/08/2019   Procedure: GIVENS CAPSULE STUDY;  Surgeon: Midge Minium, MD;  Location: [facility] ENDOSCOPY;  Service: Endoscopy;  Laterality: N/A;  . HIP FRACTURE SURGERY      Current Medications: Current Meds  Medication Sig  . acetaminophen (TYLENOL) 325 MG tablet Take 3 tablets (975 mg total) by mouth 3 (three) times daily as needed.  Marland Kitchen albuterol (PROVENTIL) (2.5 MG/3ML) 0.083% nebulizer solution Take 3 mLs (2.5  mg total) by nebulization every 6 (six) hours as needed for wheezing or shortness of breath.  Marland Kitchen atorvastatin (LIPITOR) 80 MG tablet Take 80 mg by mouth daily.  . busPIRone (BUSPAR) 10 MG tablet Take 10 mg by mouth 2 (two) times daily.   . carvedilol (COREG) 6.25 MG tablet Take 6.25 mg by mouth 2 (two) times daily.  . citalopram (CELEXA) 40 MG tablet Take 40 mg by mouth daily.  . clonazePAM  (KLONOPIN) 2 MG tablet Take 2 mg by mouth 2 (two) times daily.  . DULoxetine (CYMBALTA) 30 MG capsule Take 30 mg by mouth daily.  . furosemide (LASIX) 40 MG tablet Take 1 tablet (40 mg total) by mouth daily.  Marland Kitchen gabapentin (NEURONTIN) 300 MG capsule Take 300 mg by mouth 2 (two) times daily.   Marland Kitchen ipratropium-albuterol (DUONEB) 0.5-2.5 (3) MG/3ML SOLN Take 3 mLs by nebulization every 6 (six) hours as needed.  Marland Kitchen lisinopril (PRINIVIL,ZESTRIL) 20 MG tablet Take 20 mg by mouth daily.    . nitroGLYCERIN (NITROSTAT) 0.4 MG SL tablet Place 0.4 mg under the tongue as needed.  . pantoprazole (PROTONIX) 40 MG tablet Take 1 tablet (40 mg total) by mouth 2 (two) times daily before a meal.  . SYMBICORT 80-4.5 MCG/ACT inhaler Inhale 2 puffs into the lungs 2 (two) times daily.     Allergies:   Chantix [varenicline], Diphenhydramine hcl, Potassium, Benadryl [diphenhydramine hcl], Niacin, and Trazodone   Social History   Socioeconomic History  . Marital status: Widowed    Spouse name: Not on file  . Number of children: Not on file  . Years of education: Not on file  . Highest education level: Not on file  Occupational History  . Not on file  Tobacco Use  . Smoking status: Current Some Day Smoker    Packs/day: 1.00    Years: 46.00    Pack years: 46.00    Types: Cigarettes  . Smokeless tobacco: Never Used  . Tobacco comment: she feels that the 21 nicotine made her jittery and we suggested a lower level patch  Substance and Sexual Activity  . Alcohol use: No  . Drug use: Yes    Types: Marijuana    Comment: monthly  . Sexual activity: Not Currently  Other Topics Concern  . Not on file  Social History Narrative  . Not on file   Social Determinants of Health   Financial Resource Strain:   . Difficulty of Paying Living Expenses:   Food Insecurity:   . Worried About Programme researcher, broadcasting/film/video in the Last Year:   . Barista in the Last Year:   Transportation Needs:   . Freight forwarder  (Medical):   Marland Kitchen Lack of Transportation (Non-Medical):   Physical Activity:   . Days of Exercise per Week:   . Minutes of Exercise per Session:   Stress:   . Feeling of Stress :   Social Connections:   . Frequency of Communication with Friends and Family:   . Frequency of Social Gatherings with Friends and Family:   . Attends Religious Services:   . Active Member of Clubs or Organizations:   . Attends Banker Meetings:   Marland Kitchen Marital Status:      Family History: The patient's family history includes Heart attack in her brother, father, and mother; Heart failure in her mother.  ROS:   Please see the history of present illness.     All other systems reviewed and are negative.  EKGs/Labs/Other Studies Reviewed:    The following studies were reviewed today:   EKG:  EKG is  ordered today.  The ekg ordered today demonstrates normal sinus rhythm, normal ECG.  Recent Labs: 05/16/2019: B Natriuretic Peptide 260.0 10/06/2019: Magnesium 1.8 11/13/2019: ALT 29 04/04/2020: BUN 15; Creatinine, Ser 0.99; Hemoglobin 10.5; Platelets 277; Potassium 3.6; Sodium 132  Recent Lipid Panel    Component Value Date/Time   CHOL 214 (H) 10/15/2011 0506   TRIG 225 (H) 10/15/2011 0506   HDL 45 10/15/2011 0506   CHOLHDL 4.8 10/15/2011 0506   VLDL 45 (H) 10/15/2011 0506   LDLCALC 124 (H) 10/15/2011 0506    Physical Exam:    VS:  BP 130/70 (BP Location: Right Arm, Patient Position: Sitting, Cuff Size: Normal)   Pulse 61   Ht 5\' 7"  (1.702 m)   Wt 136 lb 6 oz (61.9 kg)   SpO2 99%   BMI 21.36 kg/m     Wt Readings from Last 3 Encounters:  05/03/20 136 lb 6 oz (61.9 kg)  04/04/20 135 lb (61.2 kg)  03/19/20 138 lb 14.2 oz (63 kg)     GEN:  Well nourished, well developed in no acute distress HEENT: Normal NECK: No JVD; No carotid bruits LYMPHATICS: No lymphadenopathy CARDIAC: RRR, 2/6 systolic murmur, rubs, gallops RESPIRATORY:  Clear to auscultation without rales, wheezing or rhonchi    ABDOMEN: Soft, non-tender, non-distended MUSCULOSKELETAL:  No edema; No deformity  SKIN: Warm and dry NEUROLOGIC:  Alert and oriented x 3 PSYCHIATRIC:  Normal affect   ASSESSMENT:    1. Chest pain of uncertain etiology   2. Smoking   3. PAD (peripheral artery disease) (HCC)    PLAN:    In order of problems listed above:  1. Patient with chest tightness, history of CAD. Get echocardiogram, continue carvedilol, Lipitor as prescribed. Due to history of GI bleeds and on account of blood loss, will refrain from any invasive strategy at this time until that is addressed. Recommend patient call his primary care provider regarding possible abdominal tumor work-up if that is the case. 2. She is a current smoker, cessation advised. 3. History of PAD, has symptoms of claudication. Smoking cessation advised. Continue Lipitor. Will get peripheral arterial ultrasound.  Follow-up after echocardiogram and arterial ultrasound.   Medication Adjustments/Labs and Tests Ordered: Current medicines are reviewed at length with the patient today.  Concerns regarding medicines are outlined above.  Orders Placed This Encounter  Procedures  . EKG 12-Lead  . ECHOCARDIOGRAM COMPLETE  . VAS [place] LOWER EXTREMITY ARTERIAL DUPLEX   No orders of the defined types were placed in this encounter.   Patient Instructions  Medication Instructions:  No Changes *If you need a refill on your cardiac medications before your next appointment, please call your pharmacy*   Lab Work: None Ordered If you have labs (blood work) drawn today and your tests are completely normal, you will receive your results only by: Marland Kitchen MyChart Message (if you have MyChart) OR . A paper copy in the mail If you have any lab test that is abnormal or we need to change your treatment, we will call you to review the results.   Testing/Procedures:  Your physician has requested that you have an echocardiogram. Echocardiography is a painless  test that uses sound waves to create images of your heart. It provides your doctor with information about the size and shape of your heart and how well your heart's [place] and valves are working. This procedure  takes approximately one hour. There are no restrictions for this procedure.  Your physician has requested that you have a lower extremity arterial duplex. This test is an ultrasound of the arteries in the legs or arms. It looks at arterial blood flow in the legs and arms. Allow one hour for Lower and Upper Arterial scans. There are no restrictions or special instructions  Follow-Up: At [facility], you and your health needs are our priority.  As part of our continuing mission to provide you with exceptional heart care, we have created designated Provider Care Teams.  These Care Teams include your primary Cardiologist (physician) and Advanced Practice Providers (APPs -  Physician Assistants and Nurse Practitioners) who all work together to provide you with the care you need, when you need it.  We recommend signing up for the patient portal called "MyChart".  Sign up information is provided on this After Visit Summary.  MyChart is used to connect with patients for Virtual Visits (Telemedicine).  Patients are able to view lab/test results, encounter notes, upcoming appointments, etc.  Non-urgent messages can be sent to your provider as well.   To learn more about what you can do with MyChart, go to [email or web address].    Your next appointment:   After your testing is complete   The format for your next appointment:   In Person  Provider:   Debbe Odea, MD   Other Instructions   Echocardiogram An echocardiogram is a procedure that uses painless sound waves (ultrasound) to produce an image of the heart. Images from an echocardiogram can provide important information about:  Signs of coronary artery disease (CAD).  Aneurysm detection. An aneurysm is a weak or damaged part  of an artery wall that bulges out from the normal force of blood pumping through the body.  Heart size and shape. Changes in the size or shape of the heart can be associated with certain conditions, including heart failure, aneurysm, and CAD.  Heart muscle function.  Heart valve function.  Signs of a past heart attack.  Fluid buildup around the heart.  Thickening of the heart muscle.  A tumor or infectious growth around the heart valves. Tell a health care provider about:  Any allergies you have.  All medicines you are taking, including vitamins, herbs, eye drops, creams, and over-the-counter medicines.  Any blood disorders you have.  Any surgeries you have had.  Any medical conditions you have.  Whether you are pregnant or may be pregnant. What are the risks? Generally, this is a safe procedure. However, problems may occur, including:  Allergic reaction to dye (contrast) that may be used during the procedure. What happens before the procedure? No specific preparation is needed. You may eat and drink normally. What happens during the procedure?   An IV tube may be inserted into one of your veins.  You may receive contrast through this tube. A contrast is an injection that improves the quality of the pictures from your heart.  A gel will be applied to your chest.  A wand-like tool (transducer) will be moved over your chest. The gel will help to transmit the sound waves from the transducer.  The sound waves will harmlessly bounce off of your heart to allow the heart images to be captured in real-time motion. The images will be recorded on a computer. The procedure may vary among health care providers and hospitals. What happens after the procedure?  You may return to your normal, everyday life, including diet, activities,  and medicines, unless your health care provider tells you not to do that. Summary  An echocardiogram is a procedure that uses painless sound waves  (ultrasound) to produce an image of the heart.  Images from an echocardiogram can provide important information about the size and shape of your heart, heart muscle function, heart valve function, and fluid buildup around your heart.  You do not need to do anything to prepare before this procedure. You may eat and drink normally.  After the echocardiogram is completed, you may return to your normal, everyday life, unless your health care provider tells you not to do that. This information is not intended to replace advice given to you by your health care provider. Make sure you discuss any questions you have with your health care provider. Document Revised: 03/17/2019 Document Reviewed: 12/27/2016 Elsevier Patient Education  2020 Elsevier Inc.     Peripheral Vascular Disease Peripheral vascular disease (PVD) is a disease of the blood vessels. A simple term for PVD is poor circulation. In most cases, PVD narrows the blood vessels that carry blood from your heart to the rest of your body. This can result in a decreased supply of blood to your arms, legs, and internal organs, like your stomach or kidneys. However, it most often affects a person's lower legs and feet. There are two types of PVD.  Organic PVD. This is the more common type. It is caused by damage to the structure of blood vessels.  Functional PVD. This is caused by conditions that make blood vessels contract and tighten (spasm). Without treatment, PVD tends to get worse over time. PVD can also lead to acute limb ischemia. This is when an arm or leg suddenly has trouble getting enough blood. This is a medical emergency. What are the causes?  Each type of PVD has many different causes. The most common cause of PVD is buildup of a fatty material (plaque) inside your arteries (atherosclerosis). Small amounts of plaque can break off from the walls of the blood vessels and become lodged in a smaller artery. This blocks blood flow and  can cause acute limb ischemia. Other common causes of PVD include:  Blood clots that form inside of blood vessels.  Injuries to blood vessels.  Diseases that cause inflammation of blood vessels or cause blood vessel spasms.  Health behaviors and health history that increase your risk of developing PVD. What increases the risk? You are more likely to develop this condition if:  You have a family history of PVD.  You have certain medical conditions, including: ? High cholesterol. ? Diabetes. ? High blood pressure (hypertension). ? Coronary heart disease. ? Past problems with blood clots. ? Past injury, such as burns or a broken bone. These may have damaged blood vessels in your limbs. ? Buerger disease. This is caused by inflamed blood vessels in your hands and feet. ? Some forms of arthritis. ? Rare birth defects that affect the arteries in your legs. ? Kidney disease.  You use tobacco or smoke.  You do not get enough exercise.  You are obese.  You are age 25 or older. What are the signs or symptoms? This condition may cause different symptoms. Your symptoms depend on what part of your body is not getting enough blood. Some common signs and symptoms include:  Cramps in your lower legs. This may be a symptom of poor leg circulation (claudication).  Pain and weakness in your legs. This happens while you are physically active but  goes away when you rest (intermittent claudication).  Leg pain when at rest.  Leg numbness, tingling, or weakness.  Coldness in a leg or foot, especially when compared with the other leg.  Skin or hair changes. These can include: ? Hair loss. ? Shiny skin. ? Pale or bluish skin. ? Thick toenails.  Inability to get or maintain an erection (erectile dysfunction).  Fatigue. People with PVD are more likely to develop ulcers and sores on their toes, feet, or legs. These may take longer than normal to heal. How is this diagnosed? This  condition is diagnosed based on:  Your signs and symptoms.  A physical exam and your medical history.  Other tests to find out what is causing your PVD and to determine its severity. Tests may include: ? Blood pressure recordings from your arms and legs and measurements of the strength of your pulses (pulse volume recordings). ? Imaging studies using sound waves to take pictures of the blood flow through your blood vessels (Doppler ultrasound). ? Injecting a dye into your blood vessels before having imaging studies using:  X-rays (angiogram or arteriogram).  Computer-generated X-rays (CT angiogram).  A powerful electromagnetic field and a computer (magnetic resonance angiogram or MRA). How is this treated? Treatment for PVD depends on the cause of your condition and how severe your symptoms are. It also depends on your age. Underlying causes need to be treated and controlled. These include long-term (chronic) conditions, such as diabetes, high cholesterol, and high blood pressure. Treatment includes:  Lifestyle changes, such as: ? Quitting smoking. ? Exercising regularly. ? Following a low-fat, low-cholesterol diet.  Taking medicines, such as: ? Blood thinners to prevent blood clots. ? Medicines to improve blood flow. ? Medicines to improve your blood cholesterol levels.  Surgical procedures, such as: ? A procedure that uses an inflated balloon to open a blocked artery and improve blood flow (angioplasty). ? A procedure to put in a wire mesh tube to keep a blocked artery open (stent implant). ? Surgery to reroute blood flow around a blocked artery (peripheral bypass surgery). ? Surgery to remove dead tissue from an infected wound on the affected limb. ? Amputation. This is surgical removal of the affected limb. It may be necessary in cases of acute limb ischemia where there has been no improvement through medical or surgical treatments. Follow these instructions at  home: Lifestyle  Do not use any products that contain nicotine or tobacco, such as cigarettes and e-cigarettes. If you need help quitting, ask your health care provider.  Lose weight if you are overweight, and maintain a healthy weight as discussed by your health care provider.  Eat a diet that is low in fat and cholesterol. If you need help, ask your health care provider.  Exercise regularly. Ask your health care provider to suggest some good activities for you. General instructions  Take over-the-counter and prescription medicines only as told by your health care provider.  Take good care of your feet: ? Wear comfortable shoes that fit well. ? Check your feet often for any cuts or sores.  Keep all follow-up visits as told by your health care provider. This is important. Contact a health care provider if:  You have cramps in your legs while walking.  You have leg pain when you are at rest.  You have coldness in a leg or foot.  Your skin changes.  You have erectile dysfunction.  You have cuts or sores on your feet that are not healing.  Get help right away if:  Your arm or leg turns cold, numb, and blue.  Your arms or legs become red, warm, swollen, painful, or numb.  You have chest pain or trouble breathing.  You suddenly have weakness in your face, arm, or leg.  You become very confused or lose the ability to speak.  You suddenly have a very bad headache or lose your vision. Summary  Peripheral vascular disease (PVD) is a disease of the blood vessels.  In most cases, PVD narrows the blood vessels that carry blood from your heart to the rest of your body.  PVD may cause different symptoms. Your symptoms depend on what part of your body is not getting enough blood.  Treatment for PVD depends on the cause of your condition and how severe your symptoms are. This information is not intended to replace advice given to you by your health care provider. Make sure you  discuss any questions you have with your health care provider. Document Revised: 11/06/2017 Document Reviewed: 01/01/2017 Elsevier Patient Education  2020 [employer].     Signed, Debbe Odea, MD  05/03/2020 2:02 PM    [facility]

## 2020-05-03 NOTE — Patient Instructions (Addendum)
Medication Instructions:  No Changes *If you need a refill on your cardiac medications before your next appointment, please call your pharmacy*   Lab Work: None Ordered If you have labs (blood work) drawn today and your tests are completely normal, you will receive your results only by: Marland Kitchen MyChart Message (if you have MyChart) OR . A paper copy in the mail If you have any lab test that is abnormal or we need to change your treatment, we will call you to review the results.   Testing/Procedures:  Your physician has requested that you have an echocardiogram. Echocardiography is a painless test that uses sound waves to create images of your heart. It provides your doctor with information about the size and shape of your heart and how well your heart's Marcano and valves are working. This procedure takes approximately one hour. There are no restrictions for this procedure.  Your physician has requested that you have a lower extremity arterial duplex. This test is an ultrasound of the arteries in the legs or arms. It looks at arterial blood flow in the legs and arms. Allow one hour for Lower and Upper Arterial scans. There are no restrictions or special instructions  Follow-Up: At [facility], you and your health needs are our priority.  As part of our continuing mission to provide you with exceptional heart care, we have created designated Provider Care Teams.  These Care Teams include your primary Cardiologist (physician) and Advanced Practice Providers (APPs -  Physician Assistants and Nurse Practitioners) who all work together to provide you with the care you need, when you need it.  We recommend signing up for the patient portal called "MyChart".  Sign up information is provided on this After Visit Summary.  MyChart is used to connect with patients for Virtual Visits (Telemedicine).  Patients are able to view lab/test results, encounter notes, upcoming appointments, etc.  Non-urgent messages  can be sent to your provider as well.   To learn more about what you can do with MyChart, go to [email or web address].    Your next appointment:   After your testing is complete   The format for your next appointment:   In Person  Provider:   Kate Sable, MD   Other Instructions   Echocardiogram An echocardiogram is a procedure that uses painless sound waves (ultrasound) to produce an image of the heart. Images from an echocardiogram can provide important information about:  Signs of coronary artery disease (CAD).  Aneurysm detection. An aneurysm is a weak or damaged part of an artery wall that bulges out from the normal force of blood pumping through the body.  Heart size and shape. Changes in the size or shape of the heart can be associated with certain conditions, including heart failure, aneurysm, and CAD.  Heart muscle function.  Heart valve function.  Signs of a past heart attack.  Fluid buildup around the heart.  Thickening of the heart muscle.  A tumor or infectious growth around the heart valves. Tell a health care provider about:  Any allergies you have.  All medicines you are taking, including vitamins, herbs, eye drops, creams, and over-the-counter medicines.  Any blood disorders you have.  Any surgeries you have had.  Any medical conditions you have.  Whether you are pregnant or may be pregnant. What are the risks? Generally, this is a safe procedure. However, problems may occur, including:  Allergic reaction to dye (contrast) that may be used during the procedure. What happens before the  procedure? No specific preparation is needed. You may eat and drink normally. What happens during the procedure?   An IV tube may be inserted into one of your veins.  You may receive contrast through this tube. A contrast is an injection that improves the quality of the pictures from your heart.  A gel will be applied to your chest.  A wand-like  tool (transducer) will be moved over your chest. The gel will help to transmit the sound waves from the transducer.  The sound waves will harmlessly bounce off of your heart to allow the heart images to be captured in real-time motion. The images will be recorded on a computer. The procedure may vary among health care providers and hospitals. What happens after the procedure?  You may return to your normal, everyday life, including diet, activities, and medicines, unless your health care provider tells you not to do that. Summary  An echocardiogram is a procedure that uses painless sound waves (ultrasound) to produce an image of the heart.  Images from an echocardiogram can provide important information about the size and shape of your heart, heart muscle function, heart valve function, and fluid buildup around your heart.  You do not need to do anything to prepare before this procedure. You may eat and drink normally.  After the echocardiogram is completed, you may return to your normal, everyday life, unless your health care provider tells you not to do that. This information is not intended to replace advice given to you by your health care provider. Make sure you discuss any questions you have with your health care provider. Document Revised: 03/17/2019 Document Reviewed: 12/27/2016 Elsevier Patient Education  [place].     Peripheral Vascular Disease Peripheral vascular disease (PVD) is a disease of the blood vessels. A simple term for PVD is poor circulation. In most cases, PVD narrows the blood vessels that carry blood from your heart to the rest of your body. This can result in a decreased supply of blood to your arms, legs, and internal organs, like your stomach or kidneys. However, it most often affects a person's lower legs and feet. There are two types of PVD.  Organic PVD. This is the more common type. It is caused by damage to the structure of blood  vessels.  Functional PVD. This is caused by conditions that make blood vessels contract and tighten (spasm). Without treatment, PVD tends to get worse over time. PVD can also lead to acute limb ischemia. This is when an arm or leg suddenly has trouble getting enough blood. This is a medical emergency. What are the causes?  Each type of PVD has many different causes. The most common cause of PVD is buildup of a fatty material (plaque) inside your arteries (atherosclerosis). Small amounts of plaque can break off from the walls of the blood vessels and become lodged in a smaller artery. This blocks blood flow and can cause acute limb ischemia. Other common causes of PVD include:  Blood clots that form inside of blood vessels.  Injuries to blood vessels.  Diseases that cause inflammation of blood vessels or cause blood vessel spasms.  Health behaviors and health history that increase your risk of developing PVD. What increases the risk? You are more likely to develop this condition if:  You have a family history of PVD.  You have certain medical conditions, including: ? High cholesterol. ? Diabetes. ? High blood pressure (hypertension). ? Coronary heart disease. ? Past problems with  blood clots. ? Past injury, such as burns or a broken bone. These may have damaged blood vessels in your limbs. ? Buerger disease. This is caused by inflamed blood vessels in your hands and feet. ? Some forms of arthritis. ? Rare birth defects that affect the arteries in your legs. ? Kidney disease.  You use tobacco or smoke.  You do not get enough exercise.  You are obese.  You are age 21 or older. What are the signs or symptoms? This condition may cause different symptoms. Your symptoms depend on what part of your body is not getting enough blood. Some common signs and symptoms include:  Cramps in your lower legs. This may be a symptom of poor leg circulation (claudication).  Pain and weakness  in your legs. This happens while you are physically active but goes away when you rest (intermittent claudication).  Leg pain when at rest.  Leg numbness, tingling, or weakness.  Coldness in a leg or foot, especially when compared with the other leg.  Skin or hair changes. These can include: ? Hair loss. ? Shiny skin. ? Pale or bluish skin. ? Thick toenails.  Inability to get or maintain an erection (erectile dysfunction).  Fatigue. People with PVD are more likely to develop ulcers and sores on their toes, feet, or legs. These may take longer than normal to heal. How is this diagnosed? This condition is diagnosed based on:  Your signs and symptoms.  A physical exam and your medical history.  Other tests to find out what is causing your PVD and to determine its severity. Tests may include: ? Blood pressure recordings from your arms and legs and measurements of the strength of your pulses (pulse volume recordings). ? Imaging studies using sound waves to take pictures of the blood flow through your blood vessels (Doppler ultrasound). ? Injecting a dye into your blood vessels before having imaging studies using:  X-rays (angiogram or arteriogram).  Computer-generated X-rays (CT angiogram).  A powerful electromagnetic field and a computer (magnetic resonance angiogram or MRA). How is this treated? Treatment for PVD depends on the cause of your condition and how severe your symptoms are. It also depends on your age. Underlying causes need to be treated and controlled. These include long-term (chronic) conditions, such as diabetes, high cholesterol, and high blood pressure. Treatment includes:  Lifestyle changes, such as: ? Quitting smoking. ? Exercising regularly. ? Following a low-fat, low-cholesterol diet.  Taking medicines, such as: ? Blood thinners to prevent blood clots. ? Medicines to improve blood flow. ? Medicines to improve your blood cholesterol levels.  Surgical  procedures, such as: ? A procedure that uses an inflated balloon to open a blocked artery and improve blood flow (angioplasty). ? A procedure to put in a wire mesh tube to keep a blocked artery open (stent implant). ? Surgery to reroute blood flow around a blocked artery (peripheral bypass surgery). ? Surgery to remove dead tissue from an infected wound on the affected limb. ? Amputation. This is surgical removal of the affected limb. It may be necessary in cases of acute limb ischemia where there has been no improvement through medical or surgical treatments. Follow these instructions at home: Lifestyle  Do not use any products that contain nicotine or tobacco, such as cigarettes and e-cigarettes. If you need help quitting, ask your health care provider.  Lose weight if you are overweight, and maintain a healthy weight as discussed by your health care provider.  Eat a diet  that is low in fat and cholesterol. If you need help, ask your health care provider.  Exercise regularly. Ask your health care provider to suggest some good activities for you. General instructions  Take over-the-counter and prescription medicines only as told by your health care provider.  Take good care of your feet: ? Wear comfortable shoes that fit well. ? Check your feet often for any cuts or sores.  Keep all follow-up visits as told by your health care provider. This is important. Contact a health care provider if:  You have cramps in your legs while walking.  You have leg pain when you are at rest.  You have coldness in a leg or foot.  Your skin changes.  You have erectile dysfunction.  You have cuts or sores on your feet that are not healing. Get help right away if:  Your arm or leg turns cold, numb, and blue.  Your arms or legs become red, warm, swollen, painful, or numb.  You have chest pain or trouble breathing.  You suddenly have weakness in your face, arm, or leg.  You become very  confused or lose the ability to speak.  You suddenly have a very bad headache or lose your vision. Summary  Peripheral vascular disease (PVD) is a disease of the blood vessels.  In most cases, PVD narrows the blood vessels that carry blood from your heart to the rest of your body.  PVD may cause different symptoms. Your symptoms depend on what part of your body is not getting enough blood.  Treatment for PVD depends on the cause of your condition and how severe your symptoms are. This information is not intended to replace advice given to you by your health care provider. Make sure you discuss any questions you have with your health care provider. Document Revised: 11/06/2017 Document Reviewed: 01/01/2017 Elsevier Patient Education  2020 [employer].

## 2020-05-08 DIAGNOSIS — D649 Anemia, unspecified: Secondary | ICD-10-CM

## 2020-05-08 HISTORY — DX: Anemia, unspecified: D64.9

## 2020-06-06 ENCOUNTER — Other Ambulatory Visit: Payer: Self-pay | Admitting: Cardiology

## 2020-06-06 DIAGNOSIS — I739 Peripheral vascular disease, unspecified: Secondary | ICD-10-CM

## 2020-06-06 DIAGNOSIS — R079 Chest pain, unspecified: Secondary | ICD-10-CM

## 2020-06-07 DIAGNOSIS — I272 Pulmonary hypertension, unspecified: Secondary | ICD-10-CM

## 2020-06-07 DIAGNOSIS — I350 Nonrheumatic aortic (valve) stenosis: Secondary | ICD-10-CM

## 2020-06-07 DIAGNOSIS — I2729 Other secondary pulmonary hypertension: Secondary | ICD-10-CM | POA: Insufficient documentation

## 2020-06-07 DIAGNOSIS — I340 Nonrheumatic mitral (valve) insufficiency: Secondary | ICD-10-CM

## 2020-06-07 HISTORY — DX: Nonrheumatic mitral (valve) insufficiency: I34.0

## 2020-06-07 HISTORY — DX: Nonrheumatic aortic (valve) stenosis: I35.0

## 2020-06-07 HISTORY — DX: Pulmonary hypertension, unspecified: I27.20

## 2020-06-13 ENCOUNTER — Telehealth: Payer: Self-pay

## 2020-06-13 ENCOUNTER — Other Ambulatory Visit: Payer: Self-pay

## 2020-06-13 ENCOUNTER — Ambulatory Visit (INDEPENDENT_AMBULATORY_CARE_PROVIDER_SITE_OTHER): Payer: Medicare Other

## 2020-06-13 DIAGNOSIS — I739 Peripheral vascular disease, unspecified: Secondary | ICD-10-CM

## 2020-06-13 DIAGNOSIS — R079 Chest pain, unspecified: Secondary | ICD-10-CM | POA: Diagnosis not present

## 2020-06-13 HISTORY — PX: TRANSTHORACIC ECHOCARDIOGRAM: SHX275

## 2020-06-13 MED ORDER — ISOSORBIDE MONONITRATE ER 30 MG PO TB24
30.0000 mg | ORAL_TABLET | Freq: Every day | ORAL | 6 refills | Status: DC
Start: 2020-06-13 — End: 2024-03-17

## 2020-06-13 NOTE — Telephone Encounter (Signed)
Patient in office for an echo and a LE Venous Doppler, she stated she had CP all day yesterday accompanied by a heaviness in her chest. She used her inhalers and got ' a little relief'  Stated that her chest still fest sore today. She asked if we could admit her to a hospital bed overnight, I informed her she would have to go to the ER after this appointment to be evaluated if she was still having the chest pain. She stated she would plan on doing that. I also gave her a follow up appointment for tomorrow morning with Dr. Garen Lah where we had a cancellation, and will cancel it if patient ends up getting admitted.

## 2020-06-13 NOTE — Progress Notes (Addendum)
Dr. Garen Lah saw patient during echo visit and gave a verbal order to start Imdur 30mg  QD.

## 2020-06-13 NOTE — Progress Notes (Signed)
Opened in error

## 2020-06-14 ENCOUNTER — Encounter: Payer: Self-pay | Admitting: Cardiology

## 2020-06-14 ENCOUNTER — Ambulatory Visit (INDEPENDENT_AMBULATORY_CARE_PROVIDER_SITE_OTHER): Payer: Medicare Other | Admitting: Cardiology

## 2020-06-14 VITALS — BP 120/70 | HR 74 | Ht 67.0 in | Wt 142.4 lb

## 2020-06-14 DIAGNOSIS — R079 Chest pain, unspecified: Secondary | ICD-10-CM

## 2020-06-14 DIAGNOSIS — I739 Peripheral vascular disease, unspecified: Secondary | ICD-10-CM

## 2020-06-14 DIAGNOSIS — I272 Pulmonary hypertension, unspecified: Secondary | ICD-10-CM

## 2020-06-14 DIAGNOSIS — F17200 Nicotine dependence, unspecified, uncomplicated: Secondary | ICD-10-CM | POA: Diagnosis not present

## 2020-06-14 MED ORDER — CILOSTAZOL 100 MG PO TABS: 100.0000 mg | ORAL_TABLET | Freq: Two times a day (BID) | ORAL | 6 refills | Status: DC

## 2020-06-14 MED ORDER — PANTOPRAZOLE SODIUM 40 MG PO TBEC: 40.0000 mg | DELAYED_RELEASE_TABLET | Freq: Two times a day (BID) | ORAL | 9 refills | Status: DC

## 2020-06-14 NOTE — Progress Notes (Signed)
Cardiology Office Note:    Date:  06/14/2020   ID:  Traci Duran, DOB 1956-03-30, MRN [id number]  PCP:  [name]  Cardiologist:  No primary care provider on file.  Electrophysiologist:  None   Referring MD: [name]   Chief Complaint  Patient presents with  . office visit    F/U after cardiac testing-Patient reports BL leg pain with edema. Patient requesting medication for tx of acid reflux; Meds verbally reviewed with patient.    History of Present Illness:    Traci Duran is a 64 y.o. female with a hx of anxiety, COPD, CAD, PAD, current smoker x40+ years, who presents for follow-up.  She was last seen due to chest discomfort.  Echocardiogram was ordered due to significant risk factors and current smoker.  She has a prior history of abnormal stress test with invasive work-up not done due to history of anemia and GI bleeds.  Recently told she has an abdominal tumor causing bleeding per primary care provider.  Patient also has symptoms of cough and leg pain with walking consistent with claudication.  Ankle-brachial index and peripheral arterial ultrasound was ordered.  Patient started on imdur which has helped her symptoms of angina.   Historical notes Patient seen in the ED on 04/04/2020 with chest pain and dental pain.  EKG was normal, with no evidence of ischemia, high-sensitivity troponins were normal x2.  Symptoms were deemed atypical likely due to dental pain. She was given a dose of pain medicines and discharged. She eventually had the tooth removed with resolution of symptoms. Echocardiogram on 05/2019 showed normal systolic function with moderate pulmonary hypertension, EF 55 to 60%.  Had a Myoview stress test on 08/2018 showing ischemia. Invasive work-up not planned due to history of anemia/GI bleeds.  Patient has a history of GI bleed requiring transfusions about a year ago. 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 She has never undergone work-up for this. She was advised by PCP not to take NSAIDs for pain due to bleeding issues. She also endorses symptoms of calf and leg pain with walking. Has some chest tightness with exertion which has been ongoing for a year.  Past Medical History:  Diagnosis Date  . Anemia   . Anxiety   . CHF (congestive heart failure) (HCC)   . COPD (chronic obstructive pulmonary disease) (HCC)   . Coronary artery disease   . Depression   . Gastritis   . GERD (gastroesophageal reflux disease)   . Hemorrhoids   . Hyperlipidemia   . Hypertension   . Myocardial infarction (HCC)   . PAD (peripheral artery disease) (HCC)     Past Surgical History:  Procedure Laterality Date  . APPENDECTOMY    . COLONOSCOPY WITH PROPOFOL N/A 06/09/2017   Procedure: COLONOSCOPY WITH PROPOFOL;  Surgeon: Midge Minium, MD;  Location: [facility] ENDOSCOPY;  Service: Endoscopy;  Laterality: N/A;  . COLONOSCOPY WITH PROPOFOL N/A 10/08/2019   Procedure: COLONOSCOPY WITH PROPOFOL;  Surgeon: Midge Minium, MD;  Location: [facility] ENDOSCOPY;  Service: Endoscopy;  Laterality: N/A;  . ENTEROSCOPY N/A 10/08/2019   Procedure: ENTEROSCOPY;  Surgeon: Midge Minium, MD;  Location: [facility] ENDOSCOPY;  Service: Endoscopy;  Laterality: N/A;  . ESOPHAGOGASTRODUODENOSCOPY N/A 07/30/2017   Procedure: ESOPHAGOGASTRODUODENOSCOPY (EGD);  Surgeon: Toney Reil, MD;  Location: [facility] ENDOSCOPY;  Service: Gastroenterology;  Laterality: N/A;  . ESOPHAGOGASTRODUODENOSCOPY (EGD) WITH PROPOFOL N/A 04/09/2017   Procedure: ESOPHAGOGASTRODUODENOSCOPY (  EGD) WITH PROPOFOL;  Surgeon: Midge Minium, MD;  Location: [facility] ENDOSCOPY;  Service: Endoscopy;  Laterality: N/A;  . GIVENS CAPSULE STUDY N/A 10/08/2019   Procedure: GIVENS CAPSULE STUDY;  Surgeon: Midge Minium, MD;  Location: [facility] ENDOSCOPY;  Service: Endoscopy;  Laterality: N/A;  . HIP FRACTURE SURGERY      Current Medications: Current Meds  Medication Sig  .  acetaminophen (TYLENOL) 325 MG tablet Take 3 tablets (975 mg total) by mouth 3 (three) times daily as needed.  Marland Kitchen albuterol (PROVENTIL) (2.5 MG/3ML) 0.083% nebulizer solution Take 3 mLs (2.5 mg total) by nebulization every 6 (six) hours as needed for wheezing or shortness of breath.  . alprazolam (XANAX) 2 MG tablet Take 2 mg by mouth 2 (two) times daily.   Marland Kitchen atorvastatin (LIPITOR) 80 MG tablet Take 80 mg by mouth daily.  . busPIRone (BUSPAR) 10 MG tablet Take 10 mg by mouth 2 (two) times daily.   . carvedilol (COREG) 6.25 MG tablet Take 6.25 mg by mouth 2 (two) times daily.  . citalopram (CELEXA) 40 MG tablet Take 40 mg by mouth daily.  . DULoxetine (CYMBALTA) 30 MG capsule Take 30 mg by mouth daily.  . furosemide (LASIX) 40 MG tablet Take 1 tablet (40 mg total) by mouth daily.  Marland Kitchen gabapentin (NEURONTIN) 300 MG capsule Take 300 mg by mouth 2 (two) times daily.   Marland Kitchen ipratropium-albuterol (DUONEB) 0.5-2.5 (3) MG/3ML SOLN Take 3 mLs by nebulization every 6 (six) hours as needed.  . isosorbide mononitrate (IMDUR) 30 MG 24 hr tablet Take 1 tablet (30 mg total) by mouth daily.  Marland Kitchen lisinopril (PRINIVIL,ZESTRIL) 20 MG tablet Take 20 mg by mouth daily.    . nitroGLYCERIN (NITROSTAT) 0.4 MG SL tablet Place 0.4 mg under the tongue as needed.  . SYMBICORT 80-4.5 MCG/ACT inhaler Inhale 2 puffs into the lungs 2 (two) times daily.     Allergies:   Chantix [varenicline], Diphenhydramine hcl, Potassium, Benadryl [diphenhydramine hcl], Niacin, and Trazodone   Social History   Socioeconomic History  . Marital status: Widowed    Spouse name: Not on file  . Number of children: Not on file  . Years of education: Not on file  . Highest education level: Not on file  Occupational History  . Not on file  Tobacco Use  . Smoking status: Current Some Day Smoker    Packs/day: 1.00    Years: 46.00    Pack years: 46.00    Types: Cigarettes  . Smokeless tobacco: Never Used  . Tobacco comment: she feels that the 21  nicotine made her jittery and we suggested a lower level patch  Vaping Use  . Vaping Use: Never used  Substance and Sexual Activity  . Alcohol use: No  . Drug use: Yes    Types: Marijuana    Comment: monthly  . Sexual activity: Not Currently  Other Topics Concern  . Not on file  Social History Narrative  . Not on file   Social Determinants of Health   Financial Resource Strain:   . Difficulty of Paying Living Expenses:   Food Insecurity:   . Worried About Programme researcher, broadcasting/film/video in the Last Year:   . Barista in the Last Year:   Transportation Needs:   . Freight forwarder (Medical):   Marland Kitchen Lack of Transportation (Non-Medical):   Physical Activity:   . Days of Exercise per Week:   . Minutes of Exercise per Session:   Stress:   .  Feeling of Stress :   Social Connections:   . Frequency of Communication with Friends and Family:   . Frequency of Social Gatherings with Friends and Family:   . Attends Religious Services:   . Active Member of Clubs or Organizations:   . Attends Banker Meetings:   Marland Kitchen Marital Status:      Family History: The patient's family history includes Heart attack in her brother, father, and mother; Heart failure in her mother.  ROS:   Please see the history of present illness.     All other systems reviewed and are negative.  EKGs/Labs/Other Studies Reviewed:    The following studies were reviewed today:   EKG:  EKG is  ordered today.  The ekg ordered today demonstrates normal sinus rhythm, possible left atrial enlargement.  Recent Labs: 10/06/2019: Magnesium 1.8 11/13/2019: ALT 29 04/04/2020: BUN 15; Creatinine, Ser 0.99; Hemoglobin 10.5; Platelets 277; Potassium 3.6; Sodium 132  Recent Lipid Panel    Component Value Date/Time   CHOL 214 (H) 10/15/2011 0506   TRIG 225 (H) 10/15/2011 0506   HDL 45 10/15/2011 0506   CHOLHDL 4.8 10/15/2011 0506   VLDL 45 (H) 10/15/2011 0506   LDLCALC 124 (H) 10/15/2011 0506    Physical  Exam:    VS:  BP 120/70 (BP Location: Right Arm, Patient Position: Sitting, Cuff Size: Normal)   Pulse 74   Ht 5\' 7"  (1.702 m)   Wt 142 lb 6 oz (64.6 kg)   SpO2 97%   BMI 22.30 kg/m     Wt Readings from Last 3 Encounters:  06/14/20 142 lb 6 oz (64.6 kg)  05/03/20 136 lb 6 oz (61.9 kg)  04/04/20 135 lb (61.2 kg)     GEN:  Well nourished, well developed in no acute distress HEENT: Normal NECK: No JVD; No carotid bruits LYMPHATICS: No lymphadenopathy CARDIAC: RRR, 2/6 systolic murmur, rubs, gallops RESPIRATORY:  Clear to auscultation without rales, wheezing or rhonchi  ABDOMEN: Soft, non-tender, non-distended MUSCULOSKELETAL:  No edema; No deformity  SKIN: Warm and dry NEUROLOGIC:  Alert and oriented x 3 PSYCHIATRIC:  Normal affect   ASSESSMENT:    1. Chest pain of uncertain etiology   2. Pulmonary hypertension, unspecified (HCC)   3. Smoking   4. PAD (peripheral artery disease) (HCC)    PLAN:    In order of problems listed above:  1. Patient with chest tightness, history of CAD, prior abnormal stress test.  Echocardiogram showed normal systolic function, EF 60 to 65%, severe pulmonary hypertension,  continue carvedilol, Lipitor as prescribed.  Start imdur 30 mg daily for antianginal benefit.  Due to history of GI bleeds, requiring transfusions, will refrain from any invasive strategy at this time until that is addressed. Again, Recommend patient call his primary care provider regarding possible abdominal tumor work-up if that is the case. 2. Severe pulmonary hypertension.  Likely WHO class III secondary to COPD.  Will refer patient to pulmonary medicine for additional input and management of pulmonary hypertension. 3. She is a current smoker, cessation advised. 4. History of PAD, has symptoms of claudication.  Peripheral arterial ultrasound  Showed peripheral arterial disease involving both femoral arteries, tibial arteries occluded.  Will refer patient to vascular surgery for  additional input.  Continue Lipitor as prescribed.  With bleeding issues in the past, not sharply invasive strategy would be attempted.  Smoking cessation again advised.  Start patient on cilostazol 100 mg twice daily to help with symptoms of claudication.  Follow-up in 1 month   Medication Adjustments/Labs and Tests Ordered: Current medicines are reviewed at length with the patient today.  Concerns regarding medicines are outlined above.  Orders Placed This Encounter  Procedures  . Ambulatory referral to Pulmonology  . Ambulatory referral to Vascular Surgery  . EKG 12-Lead   Meds ordered this encounter  Medications  . cilostazol (PLETAL) 100 MG tablet    Sig: Take 1 tablet (100 mg total) by mouth 2 (two) times daily.    Dispense:  60 tablet    Refill:  6  . pantoprazole (PROTONIX) 40 MG tablet    Sig: Take 1 tablet (40 mg total) by mouth 2 (two) times daily before a meal.    Dispense:  60 tablet    Refill:  9    Patient Instructions  Medication Instructions:   START taking Cilostazol (Pletal) 100mg : Take 1 tablet (100 mg total) by mouth 2 (two) times daily  *If you need a refill on your cardiac medications before your next appointment, please call your pharmacy*   Lab Work: None Ordered If you have labs (blood work) drawn today and your tests are completely normal, you will receive your results only by: Marland Kitchen MyChart Message (if you have MyChart) OR . A paper copy in the mail If you have any lab test that is abnormal or we need to change your treatment, we will call you to review the results.   Testing/Procedures: None Ordered   Follow-Up: At [facility], you and your health needs are our priority.  As part of our continuing mission to provide you with exceptional heart care, we have created designated Provider Care Teams.  These Care Teams include your primary Cardiologist (physician) and Advanced Practice Providers (APPs -  Physician Assistants and Nurse Practitioners)  who all work together to provide you with the care you need, when you need it.  We recommend signing up for the patient portal called "MyChart".  Sign up information is provided on this After Visit Summary.  MyChart is used to connect with patients for Virtual Visits (Telemedicine).  Patients are able to view lab/test results, encounter notes, upcoming appointments, etc.  Non-urgent messages can be sent to your provider as well.   To learn more about what you can do with MyChart, go to [email or web address].    Your next appointment:   1 month(s)  The format for your next appointment:   In Person  Provider:   Debbe Odea, MD   Other Instructions  Cilostazol tablets What is this medicine? CILOSTAZOL (sil [place] sta zol) is used to treat the symptoms of intermittent claudication. This condition causes pain in the legs during walking, and goes away with rest. By improving blood flow, this medicine helps people with this condition walk longer distances without pain. This medicine may be used for other purposes; ask your health care provider or pharmacist if you have questions. COMMON BRAND NAME(S): Pletal What should I tell my health care provider before I take this medicine? They need to know if you have any of the following conditions:  bleeding disorder or hemophilia  history of heart failure, heart attack, or other heart disease  an unusual or allergic reaction to cilostazol, other medicines, foods, dyes, or preservatives  pregnant or trying to get pregnant  breast-feeding How should I use this medicine? Take this medicine by mouth with a full glass of water. Follow the directions on the prescription label. Take this medicine on an empty stomach, at least  30 minutes before or 2 hours after food. Do not take with food. Take your doses at regular intervals. Do not take your medicine more often than directed. Talk to your pediatrician regarding the use of this medicine in  children. Special care may be needed. Overdosage: If you think you have taken too much of this medicine contact a poison control center or emergency room at once. NOTE: This medicine is only for you. Do not share this medicine with others. What if I miss a dose? If you miss a dose, take it as soon as you can. If it is almost time for your next dose, take only that dose. Do not take double or extra doses. What may interact with this medicine? Do not take this medicine with any of the following medications:  grapefruit juice This medicine may also interact with the following medications:  agents that prevent or treat blood clots like enoxaparin or warfarin  aspirin  diltiazem  erythromycin or clarithromycin  omeprazole  some medications for treating depression like fluoxetine, fluvoxamine, nefazodone  some medications for treating fungal infections like ketoconazole, fluconazole, itraconazole This list may not describe all possible interactions. Give your health care provider a list of all the medicines, herbs, non-prescription drugs, or dietary supplements you use. Also tell them if you smoke, drink alcohol, or use illegal drugs. Some items may interact with your medicine. What should I watch for while using this medicine? Visit your doctor or health care professional for regular checks on your progress. It may take 2 to 4 weeks for your condition to start to get better once you begin taking this medicine. In some people, it can take as long as 3 months for the condition to get better. You may get drowsy or dizzy. Do not drive, use machinery, or do anything that needs mental alertness until you know how this drug affects you. Do not stand or sit up quickly, especially if you are an older patient. This reduces the risk of dizzy or fainting spells. Alcohol can make you more drowsy and dizzy. Avoid alcoholic drinks. Smoking may have effects on the circulation that may limit the benefits you  receive from this medicine. You may wish to discuss how to stop smoking with your doctor or health care professional. If you are going to have surgery, tell your doctor or health care professional that you are taking this medicine. What side effects may I notice from receiving this medicine? Side effects that you should report to your doctor or health care professional as soon as possible:  allergic reactions like skin rash, itching or hives, swelling of the face, lips, or tongue  chest pain  fast, slow, or irregular heartbeat  signs and symptoms of bleeding such as bloody or black, tarry stools; red or dark-brown urine; spitting up blood or brown material that looks like coffee grounds; red spots on the skin; unusual bruising or bleeding from the eye, gums, or nose  swelling in the legs or ankles Side effects that usually do not require medical attention (report to your doctor or health care professional if they continue or are bothersome):  diarrhea  headache  nausea, or upset stomach This list may not describe all possible side effects. Call your doctor for medical advice about side effects. You may report side effects to FDA at 1-800-FDA-1088. Where should I keep my medicine? Keep out of the reach of children. Store at room temperature between 15 and 30 degrees C (59 and 86 degrees  F). Throw away any unused medicine after the expiration date. NOTE: This sheet is a summary. It may not cover all possible information. If you have questions about this medicine, talk to your doctor, pharmacist, or health care provider.  2020 Elsevier/Gold Standard (2013-03-17 15:00:52)      Signed, Debbe Odea, MD  06/14/2020 12:47 PM     [facility]

## 2020-06-14 NOTE — Patient Instructions (Signed)
Medication Instructions:   START taking Cilostazol (Pletal) 100mg : Take 1 tablet (100 mg total) by mouth 2 (two) times daily  *If you need a refill on your cardiac medications before your next appointment, please call your pharmacy*   Lab Work: None Ordered If you have labs (blood work) drawn today and your tests are completely normal, you will receive your results only by:  Traci Duran (if you have MyChart) OR  A paper copy in the mail If you have any lab test that is abnormal or we need to change your treatment, we will call you to review the results.   Testing/Procedures: None Ordered   Follow-Up: At [facility], you and your health needs are our priority.  As part of our continuing mission to provide you with exceptional heart care, we have created designated Provider Care Teams.  These Care Teams include your primary Cardiologist (physician) and Advanced Practice Providers (APPs -  Physician Assistants and Nurse Practitioners) who all work together to provide you with the care you need, when you need it.  We recommend signing up for the patient portal called "MyChart".  Sign up information is provided on this After Visit Summary.  MyChart is used to connect with patients for Virtual Visits (Telemedicine).  Patients are able to view lab/test results, encounter notes, upcoming appointments, etc.  Non-urgent messages can be sent to your provider as well.   To learn more about what you can do with MyChart, go to [email or web address].    Your next appointment:   1 month(s)  The format for your next appointment:   In Person  Provider:   Kate Sable, MD   Other Instructions  Cilostazol tablets What is this medicine? CILOSTAZOL (sil OH sta zol) is used to treat the symptoms of intermittent claudication. This condition causes pain in the legs during walking, and goes away with rest. By improving blood flow, this medicine helps people with this condition walk  longer distances without pain. This medicine may be used for other purposes; ask your health care provider or pharmacist if you have questions. COMMON BRAND NAME(S): Pletal What should I tell my health care provider before I take this medicine? They need to know if you have any of the following conditions:  bleeding disorder or hemophilia  history of heart failure, heart attack, or other heart disease  an unusual or allergic reaction to cilostazol, other medicines, foods, dyes, or preservatives  pregnant or trying to get pregnant  breast-feeding How should I use this medicine? Take this medicine by mouth with a full glass of water. Follow the directions on the prescription label. Take this medicine on an empty stomach, at least 30 minutes before or 2 hours after food. Do not take with food. Take your doses at regular intervals. Do not take your medicine more often than directed. Talk to your pediatrician regarding the use of this medicine in children. Special care may be needed. Overdosage: If you think you have taken too much of this medicine contact a poison control center or emergency room at once. NOTE: This medicine is only for you. Do not share this medicine with others. What if I miss a dose? If you miss a dose, take it as soon as you can. If it is almost time for your next dose, take only that dose. Do not take double or extra doses. What may interact with this medicine? Do not take this medicine with any of the following medications:  grapefruit juice This medicine  may also interact with the following medications:  agents that prevent or treat blood clots like enoxaparin or warfarin  aspirin  diltiazem  erythromycin or clarithromycin  omeprazole  some medications for treating depression like fluoxetine, fluvoxamine, nefazodone  some medications for treating fungal infections like ketoconazole, fluconazole, itraconazole This list may not describe all possible  interactions. Give your health care provider a list of all the medicines, herbs, non-prescription drugs, or dietary supplements you use. Also tell them if you smoke, drink alcohol, or use illegal drugs. Some items may interact with your medicine. What should I watch for while using this medicine? Visit your doctor or health care professional for regular checks on your progress. It may take 2 to 4 weeks for your condition to start to get better once you begin taking this medicine. In some people, it can take as long as 3 months for the condition to get better. You may get drowsy or dizzy. Do not drive, use machinery, or do anything that needs mental alertness until you know how this drug affects you. Do not stand or sit up quickly, especially if you are an older patient. This reduces the risk of dizzy or fainting spells. Alcohol can make you more drowsy and dizzy. Avoid alcoholic drinks. Smoking may have effects on the circulation that may limit the benefits you receive from this medicine. You may wish to discuss how to stop smoking with your doctor or health care professional. If you are going to have surgery, tell your doctor or health care professional that you are taking this medicine. What side effects may I notice from receiving this medicine? Side effects that you should report to your doctor or health care professional as soon as possible:  allergic reactions like skin rash, itching or hives, swelling of the face, lips, or tongue  chest pain  fast, slow, or irregular heartbeat  signs and symptoms of bleeding such as bloody or black, tarry stools; red or dark-brown urine; spitting up blood or brown material that looks like coffee grounds; red spots on the skin; unusual bruising or bleeding from the eye, gums, or nose  swelling in the legs or ankles Side effects that usually do not require medical attention (report to your doctor or health care professional if they continue or are  bothersome):  diarrhea  headache  nausea, or upset stomach This list may not describe all possible side effects. Call your doctor for medical advice about side effects. You may report side effects to FDA at 1-800-FDA-1088. Where should I keep my medicine? Keep out of the reach of children. Store at room temperature between 15 and 30 degrees C (59 and 86 degrees F). Throw away any unused medicine after the expiration date. NOTE: This sheet is a summary. It may not cover all possible information. If you have questions about this medicine, talk to your doctor, pharmacist, or health care provider.  2020 Elsevier/Gold Standard (2013-03-17 15:00:52)

## 2020-06-18 ENCOUNTER — Ambulatory Visit: Payer: Medicare Other | Admitting: Cardiology

## 2020-07-12 ENCOUNTER — Encounter: Payer: Self-pay | Admitting: Emergency Medicine

## 2020-07-12 ENCOUNTER — Other Ambulatory Visit: Payer: Self-pay

## 2020-07-12 ENCOUNTER — Emergency Department
Admission: EM | Admit: 2020-07-12 | Discharge: 2020-07-12 | Disposition: A | Discharge status: Home / Self Care | Payer: Medicare Other | Attending: Emergency Medicine | Admitting: Emergency Medicine

## 2020-07-12 DIAGNOSIS — Z5321 Procedure and treatment not carried out due to patient leaving prior to being seen by health care provider: Secondary | ICD-10-CM | POA: Diagnosis not present

## 2020-07-12 DIAGNOSIS — R42 Dizziness and giddiness: Secondary | ICD-10-CM | POA: Diagnosis present

## 2020-07-12 LAB — BASIC METABOLIC PANEL
Anion gap: 10 (ref 5–15)
BUN: 8 mg/dL (ref 8–23)
CO2: 24 mmol/L (ref 22–32)
Calcium: 8.9 mg/dL (ref 8.9–10.3)
Chloride: 104 mmol/L (ref 98–111)
Creatinine, Ser: 0.88 mg/dL (ref 0.44–1.00)
GFR calc Af Amer: >60 mL/min (ref >60)
GFR calc non Af Amer: >60 mL/min (ref >60)
Glucose, Bld: 68 mg/dL — ABNORMAL LOW (ref 70–99)
Potassium: 3.3 mmol/L — ABNORMAL LOW (ref 3.5–5.1)
Sodium: 138 mmol/L (ref 135–145)

## 2020-07-12 LAB — CBC
HCT: 25.4 % — ABNORMAL LOW (ref 36.0–46.0)
Hemoglobin: 7.3 g/dL — ABNORMAL LOW (ref 12.0–15.0)
MCH: 21.6 pg — ABNORMAL LOW (ref 26.0–34.0)
MCHC: 28.7 g/dL — ABNORMAL LOW (ref 30.0–36.0)
MCV: 75.1 fL — ABNORMAL LOW (ref 80.0–100.0)
Platelets: 257 10*3/uL (ref 150–400)
RBC: 3.38 MIL/uL — ABNORMAL LOW (ref 3.87–5.11)
RDW: 16.3 % — ABNORMAL HIGH (ref 11.5–15.5)
WBC: 8.6 10*3/uL (ref 4.0–10.5)
nRBC: 0 % (ref 0.0–0.2)

## 2020-07-12 MED ORDER — SODIUM CHLORIDE 0.9% FLUSH: 3.0000 mL | Freq: Once | INTRAVENOUS | Status: DC

## 2020-07-12 NOTE — ED Triage Notes (Signed)
Pt states for about a week now she has been dizzy, and has "felt like her heart has been pounding in her ears." She states her heart has been pounding and making her feel awful, states it has calmed down some this afternoon. Appears pale. Denies any pain.

## 2020-07-12 NOTE — ED Notes (Signed)
Pt states she ate a cheeseburger for lunch and felt okay, now feeling tired and dizzy, needs to lay down. Pt placed in recliner chair.

## 2020-07-16 ENCOUNTER — Other Ambulatory Visit: Payer: Self-pay

## 2020-07-16 ENCOUNTER — Ambulatory Visit (INDEPENDENT_AMBULATORY_CARE_PROVIDER_SITE_OTHER): Payer: Medicare Other | Admitting: Cardiology

## 2020-07-16 ENCOUNTER — Telehealth: Payer: Self-pay | Admitting: Cardiology

## 2020-07-16 ENCOUNTER — Telehealth: Payer: Self-pay | Admitting: Emergency Medicine

## 2020-07-16 ENCOUNTER — Emergency Department: Payer: Medicare Other

## 2020-07-16 ENCOUNTER — Encounter: Payer: Self-pay | Admitting: Emergency Medicine

## 2020-07-16 ENCOUNTER — Encounter: Payer: Self-pay | Admitting: Cardiology

## 2020-07-16 VITALS — BP 100/66 | HR 73 | Ht 67.0 in | Wt 141.0 lb

## 2020-07-16 DIAGNOSIS — D62 Acute posthemorrhagic anemia: Secondary | ICD-10-CM | POA: Diagnosis not present

## 2020-07-16 DIAGNOSIS — D649 Anemia, unspecified: Secondary | ICD-10-CM

## 2020-07-16 DIAGNOSIS — K219 Gastro-esophageal reflux disease without esophagitis: Secondary | ICD-10-CM | POA: Diagnosis present

## 2020-07-16 DIAGNOSIS — I110 Hypertensive heart disease with heart failure: Secondary | ICD-10-CM | POA: Diagnosis present

## 2020-07-16 DIAGNOSIS — F17200 Nicotine dependence, unspecified, uncomplicated: Secondary | ICD-10-CM | POA: Diagnosis not present

## 2020-07-16 DIAGNOSIS — I739 Peripheral vascular disease, unspecified: Secondary | ICD-10-CM | POA: Diagnosis not present

## 2020-07-16 DIAGNOSIS — I2510 Atherosclerotic heart disease of native coronary artery without angina pectoris: Secondary | ICD-10-CM | POA: Diagnosis present

## 2020-07-16 DIAGNOSIS — R55 Syncope and collapse: Secondary | ICD-10-CM | POA: Diagnosis present

## 2020-07-16 DIAGNOSIS — E785 Hyperlipidemia, unspecified: Secondary | ICD-10-CM | POA: Diagnosis present

## 2020-07-16 DIAGNOSIS — R42 Dizziness and giddiness: Secondary | ICD-10-CM

## 2020-07-16 DIAGNOSIS — D509 Iron deficiency anemia, unspecified: Secondary | ICD-10-CM | POA: Diagnosis not present

## 2020-07-16 DIAGNOSIS — Z9119 Patient's noncompliance with other medical treatment and regimen: Secondary | ICD-10-CM

## 2020-07-16 DIAGNOSIS — K449 Diaphragmatic hernia without obstruction or gangrene: Secondary | ICD-10-CM | POA: Diagnosis present

## 2020-07-16 DIAGNOSIS — F329 Major depressive disorder, single episode, unspecified: Secondary | ICD-10-CM | POA: Diagnosis present

## 2020-07-16 DIAGNOSIS — F419 Anxiety disorder, unspecified: Secondary | ICD-10-CM | POA: Diagnosis present

## 2020-07-16 DIAGNOSIS — Z7951 Long term (current) use of inhaled steroids: Secondary | ICD-10-CM

## 2020-07-16 DIAGNOSIS — Z8249 Family history of ischemic heart disease and other diseases of the circulatory system: Secondary | ICD-10-CM

## 2020-07-16 DIAGNOSIS — R079 Chest pain, unspecified: Secondary | ICD-10-CM

## 2020-07-16 DIAGNOSIS — Z79899 Other long term (current) drug therapy: Secondary | ICD-10-CM

## 2020-07-16 DIAGNOSIS — Z20822 Contact with and (suspected) exposure to covid-19: Secondary | ICD-10-CM | POA: Diagnosis present

## 2020-07-16 DIAGNOSIS — I679 Cerebrovascular disease, unspecified: Secondary | ICD-10-CM | POA: Diagnosis present

## 2020-07-16 DIAGNOSIS — I252 Old myocardial infarction: Secondary | ICD-10-CM

## 2020-07-16 DIAGNOSIS — Z86010 Personal history of colonic polyps: Secondary | ICD-10-CM

## 2020-07-16 DIAGNOSIS — I5032 Chronic diastolic (congestive) heart failure: Secondary | ICD-10-CM | POA: Diagnosis present

## 2020-07-16 DIAGNOSIS — E162 Hypoglycemia, unspecified: Secondary | ICD-10-CM | POA: Diagnosis present

## 2020-07-16 DIAGNOSIS — K5520 Angiodysplasia of colon without hemorrhage: Secondary | ICD-10-CM | POA: Diagnosis present

## 2020-07-16 DIAGNOSIS — J449 Chronic obstructive pulmonary disease, unspecified: Secondary | ICD-10-CM | POA: Diagnosis present

## 2020-07-16 DIAGNOSIS — F17210 Nicotine dependence, cigarettes, uncomplicated: Secondary | ICD-10-CM | POA: Diagnosis present

## 2020-07-16 DIAGNOSIS — I959 Hypotension, unspecified: Secondary | ICD-10-CM | POA: Diagnosis present

## 2020-07-16 DIAGNOSIS — Z9049 Acquired absence of other specified parts of digestive tract: Secondary | ICD-10-CM

## 2020-07-16 DIAGNOSIS — I2721 Secondary pulmonary arterial hypertension: Secondary | ICD-10-CM | POA: Diagnosis present

## 2020-07-16 DIAGNOSIS — K922 Gastrointestinal hemorrhage, unspecified: Secondary | ICD-10-CM | POA: Diagnosis present

## 2020-07-16 LAB — CBC
HCT: 25.6 % — ABNORMAL LOW (ref 36.0–46.0)
Hemoglobin: 7.2 g/dL — ABNORMAL LOW (ref 12.0–15.0)
MCH: 21.1 pg — ABNORMAL LOW (ref 26.0–34.0)
MCHC: 28.1 g/dL — ABNORMAL LOW (ref 30.0–36.0)
MCV: 74.9 fL — ABNORMAL LOW (ref 80.0–100.0)
Platelets: 280 10*3/uL (ref 150–400)
RBC: 3.42 MIL/uL — ABNORMAL LOW (ref 3.87–5.11)
RDW: 16.5 % — ABNORMAL HIGH (ref 11.5–15.5)
WBC: 7.9 10*3/uL (ref 4.0–10.5)
nRBC: 0 % (ref 0.0–0.2)

## 2020-07-16 LAB — BASIC METABOLIC PANEL
Anion gap: 7 (ref 5–15)
BUN: 11 mg/dL (ref 8–23)
CO2: 26 mmol/L (ref 22–32)
Calcium: 8.7 mg/dL — ABNORMAL LOW (ref 8.9–10.3)
Chloride: 107 mmol/L (ref 98–111)
Creatinine, Ser: 0.88 mg/dL (ref 0.44–1.00)
GFR calc Af Amer: >60 mL/min (ref >60)
GFR calc non Af Amer: >60 mL/min (ref >60)
Glucose, Bld: 61 mg/dL — ABNORMAL LOW (ref 70–99)
Potassium: 3.1 mmol/L — ABNORMAL LOW (ref 3.5–5.1)
Sodium: 140 mmol/L (ref 135–145)

## 2020-07-16 LAB — TROPONIN I (HIGH SENSITIVITY): Troponin I (High Sensitivity): 4 ng/L (ref <18)

## 2020-07-16 NOTE — Telephone Encounter (Signed)
Spoke with patient and she stated that she is feeling a bit better and less dizzy. She stated she did go into the ER last week in 8/5 and they had did a lab draw, but she got tired of waiting and left.  The labs show her Hgb is 7.3, and her Hct is 25.4. Her K+ was also 3.3.  Patient stated that she wanted to come to appointment today at 1420 and not go back to the ED. Will route to Dr. Garen Lah for [facility]

## 2020-07-16 NOTE — Patient Instructions (Signed)
Medication Instructions:  Your physician has recommended you make the following change in your medication:   1.  STOP your .  *If you need a refill on your cardiac medications before your next appointment, please call your pharmacy*   Lab Work: None Ordered If you have labs (blood work) drawn today and your tests are completely normal, you will receive your results only by: Marland Kitchen MyChart Message (if you have MyChart) OR . A paper copy in the mail If you have any lab test that is abnormal or we need to change your treatment, we will call you to review the results.   Testing/Procedures: None Ordered.   Follow-Up: At [facility], you and your health needs are our priority.  As part of our continuing mission to provide you with exceptional heart care, we have created designated Provider Care Teams.  These Care Teams include your primary Cardiologist (physician) and Advanced Practice Providers (APPs -  Physician Assistants and Nurse Practitioners) who all work together to provide you with the care you need, when you need it.  We recommend signing up for the patient portal called "MyChart".  Sign up information is provided on this After Visit Summary.  MyChart is used to connect with patients for Virtual Visits (Telemedicine).  Patients are able to view lab/test results, encounter notes, upcoming appointments, etc.  Non-urgent messages can be sent to your provider as well.   To learn more about what you can do with MyChart, go to [email or web address].    Your next appointment:   3 month(s)  The format for your next appointment:   In Person  Provider:   Kate Sable, MD   Other Instructions

## 2020-07-16 NOTE — Telephone Encounter (Signed)
Called patient due to lwot to inquire about condition and follow up plans. She says she actually feels better today, but she has appt with her doctor today. I told her that she definitely needs to go to appointment.  She then says she thinks she is bleeding somewhere and may need a transfusion.   I told her hgb and hematocrit levels which are lower, but stressed that she may have something else wrong to cause the weakness and needs to see a doctor.  She does not have any blood in stool or black tarry stools recently.  She says she will go to the appoinement.

## 2020-07-16 NOTE — Progress Notes (Signed)
Cardiology Office Note:    Date:  07/16/2020   ID:  Traci [facility]an, DOB [birth year], MRN [id number]  PCP:  [facility]  Cardiologist:  No primary care provider on file.  Electrophysiologist:  None   Referring MD: [facility]*   Chief Complaint  Patient presents with  . other    Follow up from [facility]; chest pain. Meds reviewed by the pt. verbally. Pt. c/o chest pain, dizziness, leg weakness and shortness of breath with over exeriton.     History of Present Illness:    Traci Duran is a 64 y.o. female with a hx of anxiety, COPD, CAD, PAD, current smoker x40+ years, who presents for follow-up.  She was last seen due to chest discomfort, abnormal stress test and invasive work-up not performed due to GI bleed history.  Imdur was started which helped patient's angina.  Has a history of claudication due to PAD, cilostazol started.  Patient referred to vascular surgery.  Seen in the ED 4 days ago on 07/12/2020 due to dizziness and fatigue.  States feeling this way for over a week now.  Blood work revealed hemoglobin of 7.3.  She left without being fully worked up.  She still feels very weak during office visit today.  She states making an error during prior visits.  Denies stomach tumor, states having an ulcer instead.  Has never seen GI.   Historical notes Patient seen in the ED on 04/04/2020 with chest pain and dental pain.  EKG was normal, with no evidence of ischemia, high-sensitivity troponins were normal x2.  Symptoms were deemed atypical likely due to dental pain. She was given a dose of pain medicines and discharged. She eventually had the tooth removed with resolution of symptoms. Echocardiogram on 05/2019 showed normal systolic function with moderate pulmonary hypertension, EF 55 to 60%.  Had a Myoview stress test on 08/2018 showing ischemia. Invasive work-up not planned due to history of anemia/GI bleeds.  Patient has a history of GI bleed requiring  transfusions . 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 She has never undergone work-up for this. She was advised by PCP not to take NSAIDs for pain due to bleeding issues. She also endorses symptoms of calf and leg pain with walking. Has some chest tightness with exertion which has been ongoing for a year.  Past Medical History:  Diagnosis Date  . Anemia   . Anxiety   . CHF (congestive heart failure) (HCC)   . COPD (chronic obstructive pulmonary disease) (HCC)   . Coronary artery disease   . Depression   . Gastritis   . GERD (gastroesophageal reflux disease)   . Hemorrhoids   . Hyperlipidemia   . Hypertension   . Myocardial infarction (HCC)   . PAD (peripheral artery disease) (HCC)     Past Surgical History:  Procedure Laterality Date  . APPENDECTOMY    . COLONOSCOPY WITH PROPOFOL N/A 06/09/2017   Procedure: COLONOSCOPY WITH PROPOFOL;  Surgeon: Traci Minium, MD;  Location: [facility] ENDOSCOPY;  Service: Endoscopy;  Laterality: N/A;  . COLONOSCOPY WITH PROPOFOL N/A 10/08/2019   Procedure: COLONOSCOPY WITH PROPOFOL;  Surgeon: Traci Minium, MD;  Location: [facility] ENDOSCOPY;  Service: Endoscopy;  Laterality: N/A;  . ENTEROSCOPY N/A 10/08/2019   Procedure: ENTEROSCOPY;  Surgeon: Traci Minium, MD;  Location: [facility] ENDOSCOPY;  Service: Endoscopy;  Laterality: N/A;  . ESOPHAGOGASTRODUODENOSCOPY N/A 07/30/2017   Procedure: ESOPHAGOGASTRODUODENOSCOPY (EGD);  Surgeon: Traci Reil, MD;  Location: [facility] ENDOSCOPY;  Service: Gastroenterology;  Laterality: N/A;  . ESOPHAGOGASTRODUODENOSCOPY (EGD) WITH PROPOFOL N/A 04/09/2017   Procedure: ESOPHAGOGASTRODUODENOSCOPY (EGD) WITH PROPOFOL;  Surgeon: Traci Minium, MD;  Location: [facility] ENDOSCOPY;  Service: Endoscopy;  Laterality: N/A;  . GIVENS CAPSULE STUDY N/A 10/08/2019   Procedure: GIVENS CAPSULE STUDY;  Surgeon: Traci Minium, MD;  Location: [facility] ENDOSCOPY;  Service: Endoscopy;  Laterality: N/A;  . HIP  FRACTURE SURGERY      Current Medications: Current Meds  Medication Sig  . acetaminophen (TYLENOL) 325 MG tablet Take 3 tablets (975 mg total) by mouth 3 (three) times daily as needed.  Marland Kitchen albuterol (PROVENTIL) (2.5 MG/3ML) 0.083% nebulizer solution Take 3 mLs (2.5 mg total) by nebulization every 6 (six) hours as needed for wheezing or shortness of breath.  . alprazolam (XANAX) 2 MG tablet Take 2 mg by mouth 2 (two) times daily.   Marland Kitchen atorvastatin (LIPITOR) 80 MG tablet Take 80 mg by mouth daily.  . busPIRone (BUSPAR) 10 MG tablet Take 10 mg by mouth 2 (two) times daily.   . carvedilol (COREG) 6.25 MG tablet Take 6.25 mg by mouth 2 (two) times daily.  . cilostazol (PLETAL) 100 MG tablet Take 1 tablet (100 mg total) by mouth 2 (two) times daily.  . citalopram (CELEXA) 40 MG tablet Take 40 mg by mouth daily.  . clonazePAM (KLONOPIN) 2 MG tablet Take 2 mg by mouth 2 (two) times daily.  . DULoxetine (CYMBALTA) 30 MG capsule Take 30 mg by mouth daily.  . furosemide (LASIX) 40 MG tablet Take 1 tablet (40 mg total) by mouth daily.  Marland Kitchen gabapentin (NEURONTIN) 300 MG capsule Take 300 mg by mouth 2 (two) times daily.   Marland Kitchen ipratropium-albuterol (DUONEB) 0.5-2.5 (3) MG/3ML SOLN Take 3 mLs by nebulization every 6 (six) hours as needed.  . isosorbide mononitrate (IMDUR) 30 MG 24 hr tablet Take 1 tablet (30 mg total) by mouth daily.  . nitroGLYCERIN (NITROSTAT) 0.4 MG SL tablet Place 0.4 mg under the tongue as needed.  Marland Kitchen oxyCODONE 10 MG TABS Take 1 tablet (10 mg total) by mouth every 8 (eight) hours as needed for breakthrough pain.  . pantoprazole (PROTONIX) 40 MG tablet Take 1 tablet (40 mg total) by mouth 2 (two) times daily before a meal.  . SYMBICORT 80-4.5 MCG/ACT inhaler Inhale 2 puffs into the lungs 2 (two) times daily.  . [DISCONTINUED] lisinopril (PRINIVIL,ZESTRIL) 20 MG tablet Take 20 mg by mouth daily.       Allergies:   Chantix [varenicline], Diphenhydramine hcl, Potassium, Benadryl  [diphenhydramine hcl], Niacin, and Trazodone   Social History   Socioeconomic History  . Marital status: Widowed    Spouse name: Not on file  . Number of children: Not on file  . Years of education: Not on file  . Highest education level: Not on file  Occupational History  . Not on file  Tobacco Use  . Smoking status: Current Some Day Smoker    Packs/day: 1.00    Years: 46.00    Pack years: 46.00    Types: Cigarettes  . Smokeless tobacco: Never Used  . Tobacco comment: she feels that the 21 nicotine made her jittery and we suggested a lower level patch  Vaping Use  . Vaping Use: Never used  Substance and Sexual Activity  . Alcohol use: No  . Drug use: Yes    Types: Marijuana    Comment: monthly  . Sexual activity: Not Currently  Other Topics Concern  . Not on file  Social History Narrative  . Not on file   Social Determinants of Health   Financial Resource Strain:   . Difficulty of Paying Living Expenses:   Food Insecurity:   . Worried About Programme researcher, broadcasting/film/video in the Last Year:   . Barista in the Last Year:   Transportation Needs:   . Freight forwarder (Medical):   Marland Kitchen Lack of Transportation (Non-Medical):   Physical Activity:   . Days of Exercise per Week:   . Minutes of Exercise per Session:   Stress:   . Feeling of Stress :   Social Connections:   . Frequency of Communication with Friends and Family:   . Frequency of Social Gatherings with Friends and Family:   . Attends Religious Services:   . Active Member of Clubs or Organizations:   . Attends Banker Meetings:   Marland Kitchen Marital Status:      Family History: The patient's family history includes Heart attack in her brother, father, and mother; Heart failure in her mother.  ROS:   Please see the history of present illness.     All other systems reviewed and are negative.  EKGs/Labs/Other Studies Reviewed:    The following studies were reviewed today:   EKG:  EKG is  ordered  today.  The ekg ordered today demonstrates normal sinus rhythm, possible left atrial enlargement.  Recent Labs: 10/06/2019: Magnesium 1.8 11/13/2019: ALT 29 07/12/2020: BUN 8; Creatinine, Ser 0.88; Potassium 3.3; Sodium 138 07/16/2020: Hemoglobin 7.2; Platelets 280  Recent Lipid Panel    Component Value Date/Time   CHOL 214 (H) 10/15/2011 0506   TRIG 225 (H) 10/15/2011 0506   HDL 45 10/15/2011 0506   CHOLHDL 4.8 10/15/2011 0506   VLDL 45 (H) 10/15/2011 0506   LDLCALC 124 (H) 10/15/2011 0506    Physical Exam:    VS:  BP 100/66 (BP Location: Right Arm, Patient Position: Sitting, Cuff Size: Normal)   Pulse 73   Ht 5\' 7"  (1.702 m)   Wt 141 lb (64 kg)   SpO2 99%   BMI 22.08 kg/m     Wt Readings from Last 3 Encounters:  07/16/20 141 lb (64 kg)  07/16/20 141 lb (64 kg)  07/12/20 140 lb (63.5 kg)     GEN:  Well nourished, appears fatigued HEENT: Normal NECK: No JVD; No carotid bruits LYMPHATICS: No lymphadenopathy CARDIAC: RRR, 2/6 systolic murmur, rubs, gallops RESPIRATORY:  Clear to auscultation without rales, wheezing or rhonchi  ABDOMEN: Soft, non-tender, non-distended MUSCULOSKELETAL:  No edema; No deformity  SKIN: Warm and dry NEUROLOGIC:  Alert and oriented x 3 PSYCHIATRIC:  Normal affect   ASSESSMENT:    1. Anemia, unspecified type   2. Dizziness   3. Chest pain of uncertain etiology   4. Smoking   5. PAD (peripheral artery disease) (HCC)    PLAN:    In order of problems listed above:  1. Patient with fatigue and dizziness.  Lab work from 4 days ago shows anemia with hemoglobin 7.3 (three-point drop from prior 3 months ago).  Patient likely having upper GI bleed in light of history of ulcers.  Will recommend patient go to the emergency room for adequate work-up, involving GI and transfusion as deemed appropriate.  Advised patient to take Protonix twice daily. 2. Patient with dizziness.  Anemia likely contributing.  Orthostatic vitals in the office today with  positive orthostasis.  Stop lisinopril for  now.  Recommend she gets transferred to the emergency room for management as above. 3. Patient with history of CAD, prior abnormal stress test.  Echocardiogram showed normal systolic function, EF 60 to 65%, severe pulmonary hypertension,  continue Imdur, Coreg, Lipitor for now.  Due to history of GI bleeds, requiring transfusions, will refrain from any invasive strategy at this time until that is addressed.  4. She is a current smoker, cessation advised. 5. History of PAD, has symptoms of claudication.  PAD involving both femoral arteries, tibial arteries occluded.  Referral to vascular surgery clinic after last visit.  Continue cilostazol for claudication.  Follow-up in 3 months  Total encounter time 50 minutes  Greater than 50% was spent in counseling and coordination of care with the patient    Medication Adjustments/Labs and Tests Ordered: Current medicines are reviewed at length with the patient today.  Concerns regarding medicines are outlined above.  Orders Placed This Encounter  Procedures  . EKG 12-Lead   No orders of the defined types were placed in this encounter.   Patient Instructions  Medication Instructions:  Your physician has recommended you make the following change in your medication:   1.  STOP your .  *If you need a refill on your cardiac medications before your next appointment, please call your pharmacy*   Lab Work: None Ordered If you have labs (blood work) drawn today and your tests are completely normal, you will receive your results only by: Marland Kitchen MyChart Message (if you have MyChart) OR . A paper copy in the mail If you have any lab test that is abnormal or we need to change your treatment, we will call you to review the results.   Testing/Procedures: None Ordered.   Follow-Up: At [facility], you and your health needs are our priority.  As part of our continuing mission to provide you with exceptional  heart care, we have created designated Provider Care Teams.  These Care Teams include your primary Cardiologist (physician) and Advanced Practice Providers (APPs -  Physician Assistants and Nurse Practitioners) who all work together to provide you with the care you need, when you need it.  We recommend signing up for the patient portal called "MyChart".  Sign up information is provided on this After Visit Summary.  MyChart is used to connect with patients for Virtual Visits (Telemedicine).  Patients are able to view lab/test results, encounter notes, upcoming appointments, etc.  Non-urgent messages can be sent to your provider as well.   To learn more about what you can do with MyChart, go to [email or web address].    Your next appointment:   3 month(s)  The format for your next appointment:   In Person  Provider:   Debbe Odea, MD   Other Instructions      Signed, Debbe Odea, MD  07/16/2020 4:46 PM    [facility]

## 2020-07-16 NOTE — ED Triage Notes (Signed)
Brought from heart care for low blood pressure 80/60 and because hgb was low the other day.  She is alert and in nad now.  Says she does have pain in legs and back but this is usual.

## 2020-07-16 NOTE — ED Provider Notes (Signed)
MSE was initiated and I personally evaluated the patient and placed orders (if any) at  4:08 PM on July 16, 2020.  The patient appears stable so that the remainder of the MSE may be completed by another provider.   Victorino Dike, FNP 07/16/20 Yevette Edwards    Harvest Dark, MD 07/16/20 [id number]

## 2020-07-16 NOTE — Telephone Encounter (Signed)
STAT if patient feels like he/she is going to faint   1) Are you dizzy now? yes  2) Do you feel faint or have you passed out? no  3) Do you have any other symptoms? Dizzy, back and leg pain, weak  4) Have you checked your HR and BP (record if available)? No  Patient has appointment here at 2:20 but considering going to ED

## 2020-07-16 NOTE — Progress Notes (Signed)
Escorted patient to ER via wheelchair per Dr. Thereasa Solo recommendation.

## 2020-07-17 ENCOUNTER — Inpatient Hospital Stay
Admission: EM | Admit: 2020-07-17 | Discharge: 2020-07-19 | DRG: 812 | Disposition: A | Discharge status: Home / Self Care | Payer: Medicare Other | Attending: Internal Medicine | Admitting: Internal Medicine

## 2020-07-17 ENCOUNTER — Encounter: Payer: Self-pay | Admitting: Internal Medicine

## 2020-07-17 ENCOUNTER — Other Ambulatory Visit: Payer: Self-pay

## 2020-07-17 DIAGNOSIS — R55 Syncope and collapse: Secondary | ICD-10-CM | POA: Diagnosis present

## 2020-07-17 DIAGNOSIS — I959 Hypotension, unspecified: Secondary | ICD-10-CM | POA: Diagnosis present

## 2020-07-17 DIAGNOSIS — D62 Acute posthemorrhagic anemia: Secondary | ICD-10-CM | POA: Diagnosis present

## 2020-07-17 DIAGNOSIS — I252 Old myocardial infarction: Secondary | ICD-10-CM | POA: Diagnosis not present

## 2020-07-17 DIAGNOSIS — D509 Iron deficiency anemia, unspecified: Secondary | ICD-10-CM | POA: Diagnosis present

## 2020-07-17 DIAGNOSIS — J449 Chronic obstructive pulmonary disease, unspecified: Secondary | ICD-10-CM | POA: Diagnosis present

## 2020-07-17 DIAGNOSIS — I2510 Atherosclerotic heart disease of native coronary artery without angina pectoris: Secondary | ICD-10-CM | POA: Diagnosis present

## 2020-07-17 DIAGNOSIS — Z86010 Personal history of colonic polyps: Secondary | ICD-10-CM | POA: Diagnosis not present

## 2020-07-17 DIAGNOSIS — K219 Gastro-esophageal reflux disease without esophagitis: Secondary | ICD-10-CM | POA: Diagnosis present

## 2020-07-17 DIAGNOSIS — R195 Other fecal abnormalities: Secondary | ICD-10-CM | POA: Diagnosis present

## 2020-07-17 DIAGNOSIS — I2721 Secondary pulmonary arterial hypertension: Secondary | ICD-10-CM | POA: Diagnosis present

## 2020-07-17 DIAGNOSIS — Z8719 Personal history of other diseases of the digestive system: Secondary | ICD-10-CM

## 2020-07-17 DIAGNOSIS — I5189 Other ill-defined heart diseases: Secondary | ICD-10-CM

## 2020-07-17 DIAGNOSIS — K5520 Angiodysplasia of colon without hemorrhage: Secondary | ICD-10-CM | POA: Diagnosis present

## 2020-07-17 DIAGNOSIS — Z20822 Contact with and (suspected) exposure to covid-19: Secondary | ICD-10-CM | POA: Diagnosis present

## 2020-07-17 DIAGNOSIS — I110 Hypertensive heart disease with heart failure: Secondary | ICD-10-CM | POA: Diagnosis present

## 2020-07-17 DIAGNOSIS — I5032 Chronic diastolic (congestive) heart failure: Secondary | ICD-10-CM | POA: Diagnosis present

## 2020-07-17 DIAGNOSIS — D649 Anemia, unspecified: Secondary | ICD-10-CM | POA: Diagnosis present

## 2020-07-17 DIAGNOSIS — D500 Iron deficiency anemia secondary to blood loss (chronic): Secondary | ICD-10-CM

## 2020-07-17 DIAGNOSIS — F329 Major depressive disorder, single episode, unspecified: Secondary | ICD-10-CM | POA: Diagnosis present

## 2020-07-17 DIAGNOSIS — I679 Cerebrovascular disease, unspecified: Secondary | ICD-10-CM | POA: Diagnosis present

## 2020-07-17 DIAGNOSIS — I739 Peripheral vascular disease, unspecified: Secondary | ICD-10-CM | POA: Diagnosis present

## 2020-07-17 DIAGNOSIS — K449 Diaphragmatic hernia without obstruction or gangrene: Secondary | ICD-10-CM | POA: Diagnosis present

## 2020-07-17 DIAGNOSIS — E785 Hyperlipidemia, unspecified: Secondary | ICD-10-CM | POA: Diagnosis present

## 2020-07-17 DIAGNOSIS — E162 Hypoglycemia, unspecified: Secondary | ICD-10-CM

## 2020-07-17 DIAGNOSIS — F419 Anxiety disorder, unspecified: Secondary | ICD-10-CM | POA: Diagnosis present

## 2020-07-17 DIAGNOSIS — K922 Gastrointestinal hemorrhage, unspecified: Secondary | ICD-10-CM | POA: Diagnosis present

## 2020-07-17 DIAGNOSIS — F17210 Nicotine dependence, cigarettes, uncomplicated: Secondary | ICD-10-CM | POA: Diagnosis present

## 2020-07-17 DIAGNOSIS — Z9049 Acquired absence of other specified parts of digestive tract: Secondary | ICD-10-CM | POA: Diagnosis not present

## 2020-07-17 LAB — PREPARE RBC (CROSSMATCH)

## 2020-07-17 LAB — HEMOGLOBIN AND HEMATOCRIT, BLOOD
HCT: 26.8 % — ABNORMAL LOW (ref 36.0–46.0)
HCT: 28.5 % — ABNORMAL LOW (ref 36.0–46.0)
HCT: 26.8 % — ABNORMAL LOW (ref 36.0–46.0)
HCT: 24.9 % — ABNORMAL LOW (ref 36.0–46.0)
Hemoglobin: 8.1 g/dL — ABNORMAL LOW (ref 12.0–15.0)
Hemoglobin: 8.7 g/dL — ABNORMAL LOW (ref 12.0–15.0)
Hemoglobin: 7.8 g/dL — ABNORMAL LOW (ref 12.0–15.0)
Hemoglobin: 7.2 g/dL — ABNORMAL LOW (ref 12.0–15.0)

## 2020-07-17 LAB — GLUCOSE, CAPILLARY: Glucose-Capillary: 86 mg/dL (ref 70–99)

## 2020-07-17 LAB — TROPONIN I (HIGH SENSITIVITY): Troponin I (High Sensitivity): 5 ng/L (ref <18)

## 2020-07-17 LAB — SARS CORONAVIRUS 2 BY RT PCR (HOSPITAL ORDER, PERFORMED IN ~~LOC~~ HOSPITAL LAB): SARS Coronavirus 2: NEGATIVE

## 2020-07-17 LAB — HIV ANTIBODY (ROUTINE TESTING W REFLEX): HIV Screen 4th Generation wRfx: NONREACTIVE

## 2020-07-17 MED ORDER — OXYCODONE HCL 5 MG PO TABS
5.0000 mg | ORAL_TABLET | Freq: Four times a day (QID) | ORAL | Status: DC | PRN
  Administered 2020-07-17 – 2020-07-19 (×7): 5 mg via ORAL
  Filled 2020-07-17 (×8): qty 1

## 2020-07-17 MED ORDER — IPRATROPIUM-ALBUTEROL 0.5-2.5 (3) MG/3ML IN SOLN: 3.0000 mL | Freq: Four times a day (QID) | RESPIRATORY_TRACT | Status: DC | PRN

## 2020-07-17 MED ORDER — ALPRAZOLAM 0.5 MG PO TABS
2.0000 mg | ORAL_TABLET | Freq: Two times a day (BID) | ORAL | Status: DC
  Administered 2020-07-17 – 2020-07-19 (×5): 2 mg via ORAL
  Filled 2020-07-17 (×5): qty 4

## 2020-07-17 MED ORDER — ONDANSETRON HCL 4 MG PO TABS: 4.0000 mg | ORAL_TABLET | Freq: Four times a day (QID) | ORAL | Status: DC | PRN

## 2020-07-17 MED ORDER — FUROSEMIDE 40 MG PO TABS
40.0000 mg | ORAL_TABLET | Freq: Every day | ORAL | Status: DC
  Administered 2020-07-17 – 2020-07-19 (×3): 40 mg via ORAL
  Filled 2020-07-17 (×3): qty 1

## 2020-07-17 MED ORDER — NITROGLYCERIN 0.4 MG SL SUBL: 0.4000 mg | SUBLINGUAL_TABLET | SUBLINGUAL | Status: DC | PRN

## 2020-07-17 MED ORDER — ACETAMINOPHEN 500 MG PO TABS
1000.0000 mg | ORAL_TABLET | Freq: Once | ORAL | Status: AC
  Administered 2020-07-17: 1000 mg via ORAL
  Filled 2020-07-17: qty 2

## 2020-07-17 MED ORDER — ATORVASTATIN CALCIUM 80 MG PO TABS
80.0000 mg | ORAL_TABLET | Freq: Every day | ORAL | Status: DC
  Administered 2020-07-17 – 2020-07-19 (×3): 80 mg via ORAL
  Filled 2020-07-17 (×3): qty 1

## 2020-07-17 MED ORDER — DULOXETINE HCL 30 MG PO CPEP
30.0000 mg | ORAL_CAPSULE | Freq: Every day | ORAL | Status: DC
  Administered 2020-07-17 – 2020-07-19 (×3): 30 mg via ORAL
  Filled 2020-07-17 (×3): qty 1

## 2020-07-17 MED ORDER — CILOSTAZOL 100 MG PO TABS
100.0000 mg | ORAL_TABLET | Freq: Two times a day (BID) | ORAL | Status: DC
  Administered 2020-07-17 – 2020-07-19 (×4): 100 mg via ORAL
  Filled 2020-07-17 (×6): qty 1

## 2020-07-17 MED ORDER — CARVEDILOL 6.25 MG PO TABS
6.2500 mg | ORAL_TABLET | Freq: Once | ORAL | Status: AC
  Administered 2020-07-17: 6.25 mg via ORAL
  Filled 2020-07-17: qty 1

## 2020-07-17 MED ORDER — ONDANSETRON HCL 4 MG/2ML IJ SOLN: 4.0000 mg | Freq: Four times a day (QID) | INTRAMUSCULAR | Status: DC | PRN

## 2020-07-17 MED ORDER — CARVEDILOL 6.25 MG PO TABS
6.2500 mg | ORAL_TABLET | Freq: Two times a day (BID) | ORAL | Status: DC
  Administered 2020-07-17 – 2020-07-19 (×5): 6.25 mg via ORAL
  Filled 2020-07-17 (×5): qty 1

## 2020-07-17 MED ORDER — POTASSIUM CHLORIDE CRYS ER 20 MEQ PO TBCR
40.0000 meq | EXTENDED_RELEASE_TABLET | Freq: Once | ORAL | Status: AC
  Administered 2020-07-17: 40 meq via ORAL
  Filled 2020-07-17: qty 2

## 2020-07-17 MED ORDER — SODIUM CHLORIDE 0.9 % IV SOLN: INTRAVENOUS | Status: DC

## 2020-07-17 MED ORDER — CITALOPRAM HYDROBROMIDE 20 MG PO TABS
40.0000 mg | ORAL_TABLET | Freq: Every day | ORAL | Status: DC
  Administered 2020-07-17 – 2020-07-19 (×3): 40 mg via ORAL
  Filled 2020-07-17 (×3): qty 2

## 2020-07-17 MED ORDER — BUSPIRONE HCL 10 MG PO TABS
10.0000 mg | ORAL_TABLET | Freq: Two times a day (BID) | ORAL | Status: DC
  Administered 2020-07-17 – 2020-07-19 (×4): 10 mg via ORAL
  Filled 2020-07-17 (×6): qty 1

## 2020-07-17 MED ORDER — GABAPENTIN 300 MG PO CAPS
300.0000 mg | ORAL_CAPSULE | Freq: Two times a day (BID) | ORAL | Status: DC
  Administered 2020-07-17 – 2020-07-19 (×5): 300 mg via ORAL
  Filled 2020-07-17 (×5): qty 1

## 2020-07-17 MED ORDER — SODIUM CHLORIDE 0.9 % IV SOLN
10.0000 mL/h | Freq: Once | INTRAVENOUS | Status: AC
  Administered 2020-07-17: 10 mL/h via INTRAVENOUS

## 2020-07-17 MED ORDER — MOMETASONE FURO-FORMOTEROL FUM 100-5 MCG/ACT IN AERO
2.0000 | INHALATION_SPRAY | Freq: Two times a day (BID) | RESPIRATORY_TRACT | Status: DC
  Administered 2020-07-17 – 2020-07-18 (×4): 2 via RESPIRATORY_TRACT
  Filled 2020-07-17: qty 8.8

## 2020-07-17 MED ORDER — ISOSORBIDE MONONITRATE ER 30 MG PO TB24
30.0000 mg | ORAL_TABLET | Freq: Every day | ORAL | Status: DC
  Administered 2020-07-17 – 2020-07-19 (×3): 30 mg via ORAL
  Filled 2020-07-17 (×3): qty 1

## 2020-07-17 MED ORDER — ALPRAZOLAM 0.5 MG PO TABS
2.0000 mg | ORAL_TABLET | Freq: Once | ORAL | Status: AC
  Administered 2020-07-17: 2 mg via ORAL
  Filled 2020-07-17: qty 4

## 2020-07-17 MED ORDER — PANTOPRAZOLE SODIUM 40 MG IV SOLR
40.0000 mg | INTRAVENOUS | Status: DC
  Administered 2020-07-17 – 2020-07-18 (×2): 40 mg via INTRAVENOUS
  Filled 2020-07-17 (×2): qty 40

## 2020-07-17 NOTE — ED Notes (Signed)
Pt given meal tray and gingerale at this time. Ok per md veronese

## 2020-07-17 NOTE — ED Provider Notes (Signed)
[facility] Emergency Department Provider Note  ____________________________________________  Time seen: Approximately 1:55 AM  I have reviewed the triage vital signs and the nursing notes.   HISTORY  Chief Complaint Hypotension   HPI Traci Duran is a 64 y.o. female history of iron deficiency anemia, gastric AVMs, diverticular disease, gastritis, GERD, hypertension, hyperlipidemia, CAD, CHF, COPD who presents for evaluation  weakness and dizziness.  Patient was in the emergency room 5 days ago and left without being seen due to long weights.  Today she had a follow-up with her cardiologist and told him that she was feeling very weak and lightheaded for the last week.  He noticed that her blood pressure was soft and on review of labs from 5 days ago in the emergency room he noticed that she was also anemic and recommended that she came to the emergency room for evaluation.  Patient is not on blood thinners.  She has a history of GI bleed in the past.  She denies any hematochezia, hematemesis, coffee-ground emesis, hematuria, melena.  She is complaining of severe fatigue and diffuse body aches.  No chest pain or shortness of breath.  Past Medical History:  Diagnosis Date  . Anemia   . Anxiety   . CHF (congestive heart failure) ([place])   . COPD (chronic obstructive pulmonary disease) ([place])   . Coronary artery disease   . Depression   . Gastritis   . GERD (gastroesophageal reflux disease)   . Hemorrhoids   . Hyperlipidemia   . Hypertension   . Myocardial infarction ([place])   . PAD (peripheral artery disease) [facility])     Patient Active Problem List   Diagnosis Date Noted  . Trochanteric fracture of right femur ([place]) 11/13/2019  . Occult GI bleeding   . Symptomatic anemia 10/06/2019  . Acute CHF (congestive heart failure) ([place]) 05/16/2019  . Rectal bleed 11/19/2018  . 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 Benzodiazepine abuse ([place]) 09/03/2018   . Moderate recurrent major depression ([place]) 09/03/2018  . Hyponatremia 10/11/2017  . Chest pain 07/28/2017  . Acute blood loss anemia 07/28/2017  . Demand ischemia ([place])   . Benign neoplasm of descending colon   . Polyp of sigmoid colon   . Age related osteoporosis 04/16/2017  . Iron deficiency anemia secondary to blood loss (chronic)   . GI bleed 04/06/2017  . Diastolic dysfunction 55/73/2202  . Opioid contract exists 06/27/2016  . Incidental lung nodule 01/18/2016  . Traumatic hemorrhage of cerebrum ([place]) 12/04/2015  . Hypovitaminosis D 08/29/2015  . Thyroid lesion 08/29/2015  . Major depressive disorder, single episode, unspecified 08/27/2015  . 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 Gastroesophageal reflux disease 01/26/2015  . Osteopenia 01/26/2015  . Anemia, unspecified 12/27/2014  . History of fall 12/27/2014  . Greater tuberosity of humerus fracture 10/24/2013  . AVF (arteriovenous fistula) ([place]) 11/08/2011  . Anxiety 10/24/2011  . Cerebrovascular disease 10/24/2011  . Dyslipidemia 10/24/2011  . Hypertension 10/24/2011  . Tobacco abuse 10/24/2011  . Palpitations 10/24/2011  . Renal artery stenosis ([place]) 10/24/2011  . Unstable angina ([place]) 10/14/2011  . Hyperlipidemia   . Chronic obstructive pulmonary disease ([place])   . Depression   . Peripheral arterial disease ([place])   . Coronary artery disease, non-occlusive     Past Surgical History:  Procedure Laterality Date  . APPENDECTOMY    . COLONOSCOPY WITH PROPOFOL N/A 06/09/2017   Procedure: COLONOSCOPY WITH PROPOFOL;  Surgeon: Lucilla Lame, MD;  Location: [facility] ENDOSCOPY;  Service: Endoscopy;  Laterality: N/A;  . COLONOSCOPY WITH PROPOFOL N/A 10/08/2019   Procedure: COLONOSCOPY WITH PROPOFOL;  Surgeon: Lucilla Lame, MD;  Location: [facility] ENDOSCOPY;  Service: Endoscopy;  Laterality: N/A;  . ENTEROSCOPY N/A 10/08/2019   Procedure: ENTEROSCOPY;  Surgeon: Lucilla Lame, MD;  Location: [facility] ENDOSCOPY;  Service:  Endoscopy;  Laterality: N/A;  . ESOPHAGOGASTRODUODENOSCOPY N/A 07/30/2017   Procedure: ESOPHAGOGASTRODUODENOSCOPY (EGD);  Surgeon: Lin Landsman, MD;  Location: [facility] ENDOSCOPY;  Service: Gastroenterology;  Laterality: N/A;  . ESOPHAGOGASTRODUODENOSCOPY (EGD) WITH PROPOFOL N/A 04/09/2017   Procedure: ESOPHAGOGASTRODUODENOSCOPY (EGD) WITH PROPOFOL;  Surgeon: Lucilla Lame, MD;  Location: [facility] ENDOSCOPY;  Service: Endoscopy;  Laterality: N/A;  . GIVENS CAPSULE STUDY N/A 10/08/2019   Procedure: GIVENS CAPSULE STUDY;  Surgeon: Lucilla Lame, MD;  Location: [facility] ENDOSCOPY;  Service: Endoscopy;  Laterality: N/A;  . HIP FRACTURE SURGERY      Prior to Admission medications   Medication Sig Start Date End Date Taking? Authorizing Provider  acetaminophen (TYLENOL) 325 MG tablet Take 3 tablets (975 mg total) by mouth 3 (three) times daily as needed. 02/11/19   Carrie Mew, MD  albuterol (PROVENTIL) (2.5 MG/3ML) 0.083% nebulizer solution Take 3 mLs (2.5 mg total) by nebulization every 6 (six) hours as needed for wheezing or shortness of breath. 05/18/19   Mayo, Pete Pelt, MD  alprazolam Duanne Moron) 2 MG tablet Take 2 mg by mouth 2 (two) times daily.     [provider]  atorvastatin (LIPITOR) 80 MG tablet Take 80 mg by mouth daily.    [provider]  busPIRone (BUSPAR) 10 MG tablet Take 10 mg by mouth 2 (two) times daily.     [provider]  carvedilol (COREG) 6.25 MG tablet Take 6.25 mg by mouth 2 (two) times daily. 05/28/17   [provider]  cilostazol (PLETAL) 100 MG tablet Take 1 tablet (100 mg total) by mouth 2 (two) times daily. 06/14/20   Kate Sable, MD  citalopram (CELEXA) 40 MG tablet Take 40 mg by mouth daily.    [provider]  clonazePAM (KLONOPIN) 2 MG tablet Take 2 mg by mouth 2 (two) times daily. 04/10/20   [provider]  DULoxetine (CYMBALTA) 30 MG capsule Take 30 mg by mouth daily. 05/28/17   [provider]  furosemide  (LASIX) 40 MG tablet Take 1 tablet (40 mg total) by mouth daily. 05/19/19   Lang Snow, NP  gabapentin (NEURONTIN) 300 MG capsule Take 300 mg by mouth 2 (two) times daily.     [provider]  ipratropium-albuterol (DUONEB) 0.5-2.5 (3) MG/3ML SOLN Take 3 mLs by nebulization every 6 (six) hours as needed. 05/18/19   Lang Snow, NP  isosorbide mononitrate (IMDUR) 30 MG 24 hr tablet Take 1 tablet (30 mg total) by mouth daily. 06/13/20   Kate Sable, MD  nitroGLYCERIN (NITROSTAT) 0.4 MG SL tablet Place 0.4 mg under the tongue as needed. 10/06/19   [provider]  oxyCODONE 10 MG TABS Take 1 tablet (10 mg total) by mouth every 8 (eight) hours as needed for breakthrough pain. 11/14/19   Danford, Suann Larry, MD  pantoprazole (PROTONIX) 40 MG tablet Take 1 tablet (40 mg total) by mouth 2 (two) times daily before a meal. 06/14/20   Kate Sable, MD  SYMBICORT 80-4.5 MCG/ACT inhaler Inhale 2 puffs into the lungs 2 (two) times daily. 08/12/18   [provider]  Vitamin D, Ergocalciferol, (DRISDOL) 50000 units CAPS capsule Take 50,000 Units by mouth every Saturday.  Patient not taking: Reported on 07/16/2020 05/01/17   [provider]    Allergies Chantix [varenicline], Diphenhydramine hcl, Potassium, Benadryl [diphenhydramine hcl], Niacin, and Trazodone  Family History  Problem Relation Age of Onset  . Heart failure Mother   . Heart attack Mother        mother died at 56 of an MI  . Heart attack Father        father died at 82 of an MI  . Heart attack Brother        Brother had an MI in his 90s    Social History Social History   Tobacco Use  . Smoking status: Current Some Day Smoker    Packs/day: 1.00    Years: 46.00    Pack years: 46.00    Types: Cigarettes  . Smokeless tobacco: Never Used  . Tobacco comment: she feels that the 21 nicotine made her jittery and we suggested a lower level patch  Vaping Use  . Vaping Use:  Never used  Substance Use Topics  . Alcohol use: No  . Drug use: Yes    Types: Marijuana    Comment: monthly    Review of Systems  Constitutional: Negative for fever. + Lightheadedness and generalized weakness Eyes: Negative for visual changes. ENT: Negative for sore throat. Neck: No neck pain  Cardiovascular: Negative for chest pain. Respiratory: Negative for shortness of breath. Gastrointestinal: Negative for abdominal pain, vomiting or diarrhea. Genitourinary: Negative for dysuria. Musculoskeletal: Negative for back pain. Skin: Negative for rash. Neurological: Negative for headaches, weakness or numbness. Psych: No SI or HI  ____________________________________________   PHYSICAL EXAM:  VITAL SIGNS: ED Triage Vitals  Enc Vitals Group     BP 07/16/20 1617 138/72     Pulse Rate 07/16/20 1615 66     Resp 07/16/20 1615 14     Temp 07/16/20 1615 97.8 F (36.6 C)     Temp Source 07/16/20 1615 Oral     SpO2 07/16/20 1615 99 %     Weight 07/16/20 1615 141 lb (64 kg)     Height 07/16/20 1615 5\' 7"  (1.702 m)     Head Circumference --      Peak Flow --      Pain Score 07/16/20 1615 8     Pain Loc --      Pain Edu? --      Excl. in [place]? --     Constitutional: Alert and oriented. Well appearing and in no apparent distress. HEENT:      Head: Normocephalic and atraumatic.         Eyes: Conjunctivae are normal. Sclera is non-icteric.       Mouth/Throat: Mucous membranes are moist.       Neck: Supple with no signs of meningismus. Cardiovascular: Regular rate and rhythm. No murmurs, gallops, or rubs.  Respiratory: Normal respiratory effort. Lungs are clear to auscultation bilaterally.  Gastrointestinal: Soft, non tender, rectal exam with no stool in the rectal vault. Musculoskeletal: No edema, cyanosis, or erythema of extremities. Neurologic: Normal speech and language. Face is symmetric. Moving all extremities. No gross focal neurologic deficits are appreciated. Skin: Skin  is warm, dry and intact. No rash noted. Psychiatric: Mood and affect are normal. Speech and behavior are normal.  ____________________________________________   LABS (all labs ordered are listed, but only abnormal results are displayed)  Labs Reviewed  BASIC METABOLIC PANEL - Abnormal; Notable for the following components:      Result Value  Potassium 3.1 (*)    Glucose, Bld 61 (*)    Calcium 8.7 (*)    All other components within normal limits  CBC - Abnormal; Notable for the following components:   RBC 3.42 (*)    Hemoglobin 7.2 (*)    HCT 25.6 (*)    MCV 74.9 (*)    MCH 21.1 (*)    MCHC 28.1 (*)    RDW 16.5 (*)    All other components within normal limits  SARS CORONAVIRUS 2 BY RT PCR (HOSPITAL ORDER, [place] [facility])  GLUCOSE, CAPILLARY  CBG MONITORING, ED  TYPE AND SCREEN  PREPARE RBC (CROSSMATCH)  TROPONIN I (HIGH SENSITIVITY)  TROPONIN I (HIGH SENSITIVITY)  TROPONIN I (HIGH SENSITIVITY)   ____________________________________________  EKG  ED ECG REPORT I, Rudene Re, the attending physician, personally viewed and interpreted this ECG.  Normal sinus rhythm, rate of 72, normal intervals, normal axis, no ST elevations or depressions. ____________________________________________  RADIOLOGY  I have personally reviewed the images performed during this visit and I agree with the Radiologist's read.   Interpretation by Radiologist:  DG Chest 2 View  Result Date: 07/16/2020 CLINICAL DATA:  Weakness. EXAM: CHEST - 2 VIEW COMPARISON:  None. FINDINGS: The heart size and mediastinal contours are within normal limits. Both lungs are clear. The visualized skeletal structures are unremarkable. There are atherosclerotic changes of the thoracic aorta. IMPRESSION: No active cardiopulmonary disease. Electronically Signed   By: Constance Holster M.D.   On: 07/16/2020 16:51      ____________________________________________   PROCEDURES  Procedure(s) performed:yes .1-3 Lead EKG Interpretation Performed by: Rudene Re, MD Authorized by: Rudene Re, MD     Interpretation: normal     ECG rate assessment: normal     Rhythm: sinus rhythm     Ectopy: none     Critical Care performed: yes  CRITICAL CARE Performed by: Rudene Re  ?  Total critical care time: 30 min  Critical care time was exclusive of separately billable procedures and treating other patients.  Critical care was necessary to treat or prevent imminent or life-threatening deterioration.  Critical care was time spent personally by me on the following activities: development of treatment plan with patient and/or surrogate as well as nursing, discussions with consultants, evaluation of patient's response to treatment, examination of patient, obtaining history from patient or surrogate, ordering and performing treatments and interventions, ordering and review of laboratory studies, ordering and review of radiographic studies, pulse oximetry and re-evaluation of patient's condition.  ____________________________________________   INITIAL IMPRESSION / ASSESSMENT AND PLAN / ED COURSE   64 y.o. female history of iron deficiency anemia, gastric AVMs, diverticular disease, gastritis, GERD, hypertension, hyperlipidemia, CAD, CHF, COPD who presents for evaluation  weakness and dizziness.  Patient is orthostatic with labs showing anemia, hypoglycemia, and hypokalemia.  EKG with no signs of dysrhythmias or ischemia.  Rectal exam with no stool in the rectal vault.  Most likely lower GI bleeding since patient has had that in the past.  She does have a history of diverticular disease and AVMs.  Initially on arrival to the emergency room she was orthostatic however during my evaluation patient is extremely hypertensive with systolics in the 151V.  She has not taken her Coreg this evening.  We  will give it to her right now.  Will transfuse 1 unit of blood.  Repeat blood glucose improved after patient ate.  Old medical records reviewed.  We will get patient admitted to  the hospitalist service for monitoring.  Patient placed on telemetry for close monitoring.      _____________________________________________ Please note:  Patient was evaluated in Emergency Department today for the symptoms described in the history of present illness. Patient was evaluated in the context of the global COVID-19 pandemic, which necessitated consideration that the patient might be at risk for infection with the SARS-CoV-2 virus that causes COVID-19. Institutional protocols and algorithms that pertain to the evaluation of patients at risk for COVID-19 are in a state of rapid change based on information released by regulatory bodies including the CDC and federal and state organizations. These policies and algorithms were followed during the patient's care in the ED.  Some ED evaluations and interventions may be delayed as a result of limited staffing during the pandemic.   [place] Controlled Substance Database was reviewed by me. ____________________________________________   FINAL CLINICAL IMPRESSION(S) / ED DIAGNOSES   Final diagnoses:  Acute blood loss anemia  Hypoglycemia  Near syncope      NEW MEDICATIONS STARTED DURING THIS VISIT:  ED Discharge Orders    None       Note:  This document was prepared using Dragon voice recognition software and may include unintentional dictation errors.    Alfred Levins, [place], MD 07/17/20 [phone]

## 2020-07-17 NOTE — Progress Notes (Signed)
No dizziness, bloody stools, abdominal pain or vomiting.  Vital signs are stable.  Hemoglobin improved post blood transfusion.  Replete potassium and monitor levels.  Continue current treatment.  Awaiting evaluation by gastroenterologist.

## 2020-07-17 NOTE — H&P (Signed)
History and Physical    Traci Duran ZOX:[id number] DOB: 06/19/1956 DOA: 07/17/2020  PCP: [name]   Patient coming from: Home  I have personally briefly reviewed patient's old medical records in [facility]  Chief Complaint: Dizziness  HPI: Traci Duran is a 64 y.o. female with medical history significant for GI bleed and symptomatic anemia requiring blood transfusion with extensive work-up with upper and lower endoscopy as well as capsule endoscopy in November 2020, as well as history of anxiety, CAD, diastolic CHF, HTN, who presented 5 days to the emergency room with dizziness and lightheadedness but left without being seen due to long ER wait time who went to her cardiologist office today and was found to be hypotensive with BPs 96/60 and was sent back to the ER for evaluation.  Patient denies chest pain, abdominal pain fever or chills.  Denied nausea, coffee-ground emesis or hematemesis, bloody or melanotic stool, abdominal pain. ED Course: On arrival she was hemodynamically stable with BP 138/72, pulse of 66, otherwise normal vitals.  Hemoglobin 7.2, about the same as 5 days prior when it was 7.3.  Hemoglobin 3 months prior was 10.5.  Other labs unremarkable.  Chest x-ray clear and EKG showed sinus rhythm.  Hospitalist consulted for admission.  Review of Systems: As per HPI otherwise all other systems on review of systems negative.    Past Medical History:  Diagnosis Date  . Anemia   . Anxiety   . CHF (congestive heart failure) (HCC)   . COPD (chronic obstructive pulmonary disease) (HCC)   . Coronary artery disease   . Depression   . Gastritis   . GERD (gastroesophageal reflux disease)   . Hemorrhoids   . Hyperlipidemia   . Hypertension   . Myocardial infarction (HCC)   . PAD (peripheral artery disease) (HCC)     Past Surgical History:  Procedure Laterality Date  . APPENDECTOMY    . COLONOSCOPY WITH PROPOFOL N/A 06/09/2017   Procedure:  COLONOSCOPY WITH PROPOFOL;  Surgeon: Midge Minium, MD;  Location: [facility] ENDOSCOPY;  Service: Endoscopy;  Laterality: N/A;  . COLONOSCOPY WITH PROPOFOL N/A 10/08/2019   Procedure: COLONOSCOPY WITH PROPOFOL;  Surgeon: Midge Minium, MD;  Location: [facility] ENDOSCOPY;  Service: Endoscopy;  Laterality: N/A;  . ENTEROSCOPY N/A 10/08/2019   Procedure: ENTEROSCOPY;  Surgeon: Midge Minium, MD;  Location: [facility] ENDOSCOPY;  Service: Endoscopy;  Laterality: N/A;  . ESOPHAGOGASTRODUODENOSCOPY N/A 07/30/2017   Procedure: ESOPHAGOGASTRODUODENOSCOPY (EGD);  Surgeon: Toney Reil, MD;  Location: [facility] ENDOSCOPY;  Service: Gastroenterology;  Laterality: N/A;  . ESOPHAGOGASTRODUODENOSCOPY (EGD) WITH PROPOFOL N/A 04/09/2017   Procedure: ESOPHAGOGASTRODUODENOSCOPY (EGD) WITH PROPOFOL;  Surgeon: Midge Minium, MD;  Location: [facility] ENDOSCOPY;  Service: Endoscopy;  Laterality: N/A;  . GIVENS CAPSULE STUDY N/A 10/08/2019   Procedure: GIVENS CAPSULE STUDY;  Surgeon: Midge Minium, MD;  Location: [facility] ENDOSCOPY;  Service: Endoscopy;  Laterality: N/A;  . HIP FRACTURE SURGERY       reports that she has been smoking cigarettes. She has a 46.00 pack-year smoking history. She has never used smokeless tobacco. She reports current drug use. Drug: Marijuana. She reports that she does not drink alcohol.  Allergies  Allergen Reactions  . Chantix [Varenicline] Hives, Shortness Of Breath and Palpitations  . Diphenhydramine Hcl Shortness Of Breath  . Potassium Itching and Swelling  . Benadryl [Diphenhydramine Hcl] Rash  . Niacin Rash  . Trazodone Palpitations    Family History  Problem Relation Age of Onset  . Heart failure  Mother   . Heart attack Mother        mother died at 46 of an MI  . Heart attack Father        father died at 41 of an MI  . Heart attack Brother        Brother had an MI in his 65s      Prior to Admission medications   Medication Sig Start Date End Date Taking? Authorizing Provider  acetaminophen  (TYLENOL) 325 MG tablet Take 3 tablets (975 mg total) by mouth 3 (three) times daily as needed. 02/11/19   Sharman Cheek, MD  albuterol (PROVENTIL) (2.5 MG/3ML) 0.083% nebulizer solution Take 3 mLs (2.5 mg total) by nebulization every 6 (six) hours as needed for wheezing or shortness of breath. 05/18/19   Mayo, Allyn Kenner, MD  alprazolam Prudy Feeler) 2 MG tablet Take 2 mg by mouth 2 (two) times daily.     [provider]  atorvastatin (LIPITOR) 80 MG tablet Take 80 mg by mouth daily.    [provider]  busPIRone (BUSPAR) 10 MG tablet Take 10 mg by mouth 2 (two) times daily.     [provider]  carvedilol (COREG) 6.25 MG tablet Take 6.25 mg by mouth 2 (two) times daily. 05/28/17   [provider]  cilostazol (PLETAL) 100 MG tablet Take 1 tablet (100 mg total) by mouth 2 (two) times daily. 06/14/20   Debbe Odea, MD  citalopram (CELEXA) 40 MG tablet Take 40 mg by mouth daily.    [provider]  clonazePAM (KLONOPIN) 2 MG tablet Take 2 mg by mouth 2 (two) times daily. 04/10/20   [provider]  DULoxetine (CYMBALTA) 30 MG capsule Take 30 mg by mouth daily. 05/28/17   [provider]  furosemide (LASIX) 40 MG tablet Take 1 tablet (40 mg total) by mouth daily. 05/19/19   Jimmye Norman, NP  gabapentin (NEURONTIN) 300 MG capsule Take 300 mg by mouth 2 (two) times daily.     [provider]  ipratropium-albuterol (DUONEB) 0.5-2.5 (3) MG/3ML SOLN Take 3 mLs by nebulization every 6 (six) hours as needed. 05/18/19   Jimmye Norman, NP  isosorbide mononitrate (IMDUR) 30 MG 24 hr tablet Take 1 tablet (30 mg total) by mouth daily. 06/13/20   Debbe Odea, MD  nitroGLYCERIN (NITROSTAT) 0.4 MG SL tablet Place 0.4 mg under the tongue as needed. 10/06/19   [provider]  oxyCODONE 10 MG TABS Take 1 tablet (10 mg total) by mouth every 8 (eight) hours as needed for breakthrough pain. 11/14/19   Danford, Earl Lites,  MD  pantoprazole (PROTONIX) 40 MG tablet Take 1 tablet (40 mg total) by mouth 2 (two) times daily before a meal. 06/14/20   Debbe Odea, MD  SYMBICORT 80-4.5 MCG/ACT inhaler Inhale 2 puffs into the lungs 2 (two) times daily. 08/12/18   [provider]  Vitamin D, Ergocalciferol, (DRISDOL) 50000 units CAPS capsule Take 50,000 Units by mouth every Saturday.  Patient not taking: Reported on 07/16/2020 05/01/17   [provider]    Physical Exam: Vitals:   07/16/20 2343 07/17/20 0214 07/17/20 0237 07/17/20 0334  BP: (!) 167/89 (!) 200/84 (!) 189/75 (!) 164/73  Pulse: 64 73 78 77  Resp: 16  18 18   Temp: (!) 97.5 F (36.4 C)   97.6 F (36.4 C)  TempSrc: Oral   Oral  SpO2: 100%  100%   Weight:      Height:  Vitals:   07/16/20 2343 07/17/20 0214 07/17/20 0237 07/17/20 0334  BP: (!) 167/89 (!) 200/84 (!) 189/75 (!) 164/73  Pulse: 64 73 78 77  Resp: 16  18 18   Temp: (!) 97.5 F (36.4 C)   97.6 F (36.4 C)  TempSrc: Oral   Oral  SpO2: 100%  100%   Weight:      Height:          Constitutional:  Ill-appearing. Not in any apparent distress HEENT:      Head: Normocephalic and atraumatic.         Eyes: PERLA, EOMI, Conjunctivae are normal. Sclera is non-icteric.       Mouth/Throat: Mucous membranes are moist.       Neck: Supple with no signs of meningismus. Cardiovascular: Regular rate and rhythm. No murmurs, gallops, or rubs. 2+ symmetrical distal pulses are present . No JVD. No LE edema Respiratory: Respiratory effort normal .Lungs sounds clear bilaterally. No wheezes, crackles, or rhonchi.  Gastrointestinal: Soft, non tender, and non distended with positive bowel sounds. No rebound or guarding. Genitourinary: No CVA tenderness. Musculoskeletal: Nontender with normal range of motion in all extremities. No cyanosis, or erythema of extremities. Neurologic: Normal speech and language. Face is symmetric. Moving all extremities. No gross focal neurologic  deficits . Skin: Skin is warm, dry.  No rash or ulcers Psychiatric: Mood and affect are normal Speech and behavior are normal   Labs on Admission: I have personally reviewed following labs and imaging studies  CBC: Recent Labs  Lab 07/12/20 1418 07/16/20 1621  WBC 8.6 7.9  HGB 7.3* 7.2*  HCT 25.4* 25.6*  MCV 75.1* 74.9*  PLT 257 280   Basic Metabolic Panel: Recent Labs  Lab 07/12/20 1418 07/16/20 1621  NA 138 140  K 3.3* 3.1*  CL 104 107  CO2 24 26  GLUCOSE 68* 61*  BUN 8 11  CREATININE 0.88 0.88  CALCIUM 8.9 8.7*   GFR: Estimated Creatinine Clearance: 63.6 mL/min (by C-G formula based on SCr of 0.88 mg/dL). Liver Function Tests: No results for input(s): AST, ALT, ALKPHOS, BILITOT, PROT, ALBUMIN in the last 168 hours. No results for input(s): LIPASE, AMYLASE in the last 168 hours. No results for input(s): AMMONIA in the last 168 hours. Coagulation Profile: No results for input(s): INR, PROTIME in the last 168 hours. Cardiac Enzymes: No results for input(s): CKTOTAL, CKMB, CKMBINDEX, TROPONINI in the last 168 hours. BNP (last 3 results) No results for input(s): PROBNP in the last 8760 hours. HbA1C: No results for input(s): HGBA1C in the last 72 hours. CBG: Recent Labs  Lab 07/17/20 0231  GLUCAP 86   Lipid Profile: No results for input(s): CHOL, HDL, LDLCALC, TRIG, CHOLHDL, LDLDIRECT in the last 72 hours. Thyroid Function Tests: No results for input(s): TSH, T4TOTAL, FREET4, T3FREE, THYROIDAB in the last 72 hours. Anemia Panel: No results for input(s): VITAMINB12, FOLATE, FERRITIN, TIBC, IRON, RETICCTPCT in the last 72 hours. Urine analysis:    Component Value Date/Time   COLORURINE YELLOW (A) 11/13/2019 0242   APPEARANCEUR CLEAR (A) 11/13/2019 0242   LABSPEC 1.008 11/13/2019 0242   PHURINE 6.0 11/13/2019 0242   GLUCOSEU NEGATIVE 11/13/2019 0242   HGBUR NEGATIVE 11/13/2019 0242   BILIRUBINUR NEGATIVE 11/13/2019 0242   KETONESUR NEGATIVE 11/13/2019  0242   PROTEINUR NEGATIVE 11/13/2019 0242   UROBILINOGEN 0.2 10/15/2011 0840   NITRITE NEGATIVE 11/13/2019 0242   LEUKOCYTESUR NEGATIVE 11/13/2019 0242    Radiological Exams on Admission: DG Chest 2 View  Result Date: 07/16/2020 CLINICAL DATA:  Weakness. EXAM: CHEST - 2 VIEW COMPARISON:  None. FINDINGS: The heart size and mediastinal contours are within normal limits. Both lungs are clear. The visualized skeletal structures are unremarkable. There are atherosclerotic changes of the thoracic aorta. IMPRESSION: No active cardiopulmonary disease. Electronically Signed   By: Katherine Mantle M.D.   On: 07/16/2020 16:51    EKG: Independently reviewed. Interpretation : Normal sinus rhythm  Assessment/Plan 64 year old female with history of GI bleed and symptomatic anemia requiring blood transfusion with extensive work-up with upper and lower endoscopy as well as capsule endoscopy in November 2020, as well as history of anxiety, CAD, diastolic CHF, HTN, presenting with dizziness with hemoglobin 7.2.      Symptomatic anemia   History of GI bleed Suspect occult GI bleeding -Patient presents with dizziness, hypotension of 96/60 in the outpatient setting but hemodynamically stable on evaluation in the ER, Hb 7.2 stable from 5 days prior.  Stool for FIT stool guaiac negative -Transfuse 1 unit PRBC in ER, IV hydration -Serial H&H -GI consult -We will keep n.p.o. in case endoscopy is warranted -   Chronic obstructive pulmonary disease (HCC) -Continue home inhalers.  DuoNebs as needed    Coronary artery disease, non-occlusive -Hold statins    Cerebrovascular disease -Dizziness suspect related to symptomatic anemia TIA not suspected    Diastolic dysfunction -Stable    DVT prophylaxis: SCDs Code Status: full code  Family Communication:  none  Disposition Plan: Back to previous home environment Consults called: GI Status:At the time of admission, it appears that the appropriate admission  status for this patient is INPATIENT. This is judged to be reasonable and necessary in order to provide the required intensity of service to ensure the patient's safety given the presenting symptoms, physical exam findings, and initial radiographic and laboratory data in the context of their  Comorbid conditions.   Patient requires inpatient status due to high intensity of service, high risk for further deterioration and high frequency of surveillance required.   I certify that at the point of admission it is my clinical judgment that the patient will require inpatient hospital care spanning beyond 2 midnights     Andris Baumann MD [facility]     07/17/2020, 3:47 AM

## 2020-07-17 NOTE — Consult Note (Signed)
Traci Darby, MD [address]  [place], [address] [address]  Main: [phone]  Fax: [phone] Pager: [phone]   Consultation  Referring Provider:     No ref. provider found Primary Care Physician:  [facility] Primary Gastroenterologist:  Dr. Lucilla Lame         Reason for Consultation:     Symptomatic anemia  Date of Admission:  07/17/2020 Date of Consultation:  07/17/2020         HPI:   Traci Duran is a 64 y.o. female known history of chronic iron deficiency anemia who is known to GI service from previous admission on 10/07/2019 for symptomatic anemia.  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  Her anemia completely resolved and improved to 12 g/dL on 11/13/2019.  Subsequently, within last several months her hemoglobin has been gradually declining to 10.5 in 03/2020.  It was 7.3 and she went to ER due to symptomatic anemia on 8/5.  7.2 on admission 8/9 Patient received blood transfusion and feels better already She does not know if she has black stools or rectal bleeding She does acknowledge that she needs to follow-up closely with GI as well as her PCP  Patient has history of coronary artery disease, severe pulmonary hypertension, peripheral artery disease is not on anticoagulation due to history of GI bleeds Chronic tobacco use  Celiac disease has been ruled out  NSAIDs: None  Antiplts/Anticoagulants/Anti thrombotics: None  GI Procedures:  EGD and colonoscopy 10/08/2019 - Medium-sized hiatal hernia with a single Cameron ulcer. Treated with argon plasma coagulation (APC). 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 Treated with argon plasma coagulation (APC). - Gastritis. - Normal examined duodenum. - No specimens  collected.  - One 6 mm polyp in the sigmoid colon, removed with a cold snare. Resected and retrieved. - Two non-bleeding colonic angiodysplastic lesions. Treated with argon plasma coagulation (APC). - Diverticulosis in the sigmoid colon. - Non-bleeding internal hemorrhoids.  Video capsule endoscopy 10/12/2019 Proximal small bowel AVMs, nonbleeding   Past Medical History:  Diagnosis Date  . Anemia   . Anxiety   . CHF (congestive heart failure) ([place])   . COPD (chronic obstructive pulmonary disease) ([place])   . Coronary artery disease   . Depression   . Gastritis   . GERD (gastroesophageal reflux disease)   . Hemorrhoids   . Hyperlipidemia   . Hypertension   . Myocardial infarction ([place])   . PAD (peripheral artery disease) ([place])     Past Surgical History:  Procedure Laterality Date  . APPENDECTOMY    . COLONOSCOPY WITH PROPOFOL N/A 06/09/2017   Procedure: COLONOSCOPY WITH PROPOFOL;  Surgeon: Lucilla Lame, MD;  Location: [facility] ENDOSCOPY;  Service: Endoscopy;  Laterality: N/A;  . COLONOSCOPY WITH PROPOFOL N/A 10/08/2019   Procedure: COLONOSCOPY WITH PROPOFOL;  Surgeon: Lucilla Lame, MD;  Location[facility] ENDOSCOPY;  Service: Endoscopy;  Laterality: N/A;  . ENTEROSCOPY N/A 10/08/2019   Procedure: ENTEROSCOPY;  Surgeon: Lucilla Lame, MD;  Location: [facility] ENDOSCOPY;  Service: Endoscopy;  Laterality: N/A;  . ESOPHAGOGASTRODUODENOSCOPY N/A 07/30/2017   Procedure: ESOPHAGOGASTRODUODENOSCOPY (EGD);  Surgeon: Lin Landsman, MD;  Location: [facility] ENDOSCOPY;  Service: Gastroenterology;  Laterality: N/A;  . ESOPHAGOGASTRODUODENOSCOPY (EGD) WITH PROPOFOL N/A 04/09/2017   Procedure: ESOPHAGOGASTRODUODENOSCOPY (EGD) WITH PROPOFOL;  Surgeon: Lucilla Lame, MD;  Location: [facility] ENDOSCOPY;  Service: Endoscopy;  Laterality: N/A;  . GIVENS CAPSULE STUDY N/A 10/08/2019   Procedure: GIVENS CAPSULE STUDY;  Surgeon: Lucilla Lame, MD;  Location: [facility] ENDOSCOPY;  Service: Endoscopy;  Laterality: N/A;  . HIP  FRACTURE SURGERY      Prior to Admission medications   Medication Sig Start Date End Date Taking? Authorizing Provider  acetaminophen (TYLENOL) 325 MG tablet Take 3 tablets (975 mg total) by mouth 3 (three) times daily as needed. 02/11/19  Yes Carrie Mew, MD  albuterol (PROVENTIL) (2.5 MG/3ML) 0.083% nebulizer solution Take 3 mLs (2.5 mg total) by nebulization every 6 (six) hours as needed for wheezing or shortness of breath. 05/18/19  Yes Mayo, Pete Pelt, MD  alprazolam Duanne Moron) 2 MG tablet Take 2 mg by mouth 2 (two) times daily.    Yes [provider]  atorvastatin (LIPITOR) 80 MG tablet Take 80 mg by mouth daily.   Yes [provider]  busPIRone (BUSPAR) 10 MG tablet Take 10 mg by mouth 2 (two) times daily.    Yes [provider]  carvedilol (COREG) 6.25 MG tablet Take 6.25 mg by mouth 2 (two) times daily. 05/28/17  Yes [provider]  cilostazol (PLETAL) 100 MG tablet Take 1 tablet (100 mg total) by mouth 2 (two) times daily. 06/14/20  Yes Agbor-Etang, Aaron Edelman, MD  citalopram (CELEXA) 40 MG tablet Take 40 mg by mouth daily.   Yes [provider]  DULoxetine (CYMBALTA) 30 MG capsule Take 30 mg by mouth daily. 05/28/17  Yes [provider]  furosemide (LASIX) 40 MG tablet Take 1 tablet (40 mg total) by mouth daily. 05/19/19  Yes Lang Snow, NP  gabapentin (NEURONTIN) 300 MG capsule Take 300 mg by mouth 2 (two) times daily.    Yes [provider]  ipratropium-albuterol (DUONEB) 0.5-2.5 (3) MG/3ML SOLN Take 3 mLs by nebulization every 6 (six) hours as needed. 05/18/19  Yes Lang Snow, NP  isosorbide mononitrate (IMDUR) 30 MG 24 hr tablet Take 1 tablet (30 mg total) by mouth daily. 06/13/20  Yes Agbor-Etang, Aaron Edelman, MD  nitroGLYCERIN (NITROSTAT) 0.4 MG SL tablet Place 0.4 mg under the tongue as needed. 10/06/19  Yes [provider]  pantoprazole (PROTONIX) 40 MG tablet Take 1 tablet (40 mg total) by mouth 2  (two) times daily before a meal. 06/14/20  Yes Agbor-Etang, Aaron Edelman, MD  SYMBICORT 80-4.5 MCG/ACT inhaler Inhale 2 puffs into the lungs 2 (two) times daily. 08/12/18  Yes [provider]  Vitamin D, Ergocalciferol, (DRISDOL) 50000 units CAPS capsule Take 50,000 Units by mouth every Saturday.  Patient not taking: Reported on 07/16/2020 05/01/17   [provider]    Current Facility-Administered Medications:  .  0.9 %  sodium chloride infusion, , Intravenous, Continuous, Athena Masse, MD, Last Rate: 100 mL/hr at 07/17/20 0621, New Bag at 07/17/20 0621 .  ALPRAZolam Duanne Moron) tablet 2 mg, 2 mg, Oral, BID, Jennye Boroughs, MD, 2 mg at 07/17/20 0914 .  atorvastatin (LIPITOR) tablet 80 mg, 80 mg, Oral, Daily, Jennye Boroughs, MD, 80 mg at 07/17/20 0914 .  busPIRone (BUSPAR) tablet 10 mg, 10 mg, Oral, BID, Jennye Boroughs, MD, 10 mg at 07/17/20 0913 .  carvedilol (COREG) tablet 6.25 mg, 6.25 mg, Oral, BID, Jennye Boroughs, MD, 6.25 mg at 07/17/20 0913 .  cilostazol (PLETAL) tablet 100 mg, 100 mg, Oral, BID, Jennye Boroughs, MD, 100 mg at 07/17/20 0914 .  citalopram (CELEXA) tablet 40 mg, 40 mg, Oral, Daily, Jennye Boroughs, MD, 40 mg  at 07/17/20 0913 .  DULoxetine (CYMBALTA) DR capsule 30 mg, 30 mg, Oral, Daily, Jennye Boroughs, MD, 30 mg at 07/17/20 0914 .  furosemide (LASIX) tablet 40 mg, 40 mg, Oral, Daily, Jennye Boroughs, MD, 40 mg at 07/17/20 0914 .  gabapentin (NEURONTIN) capsule 300 mg, 300 mg, Oral, BID, Jennye Boroughs, MD, 300 mg at 07/17/20 0914 .  ipratropium-albuterol (DUONEB) 0.5-2.5 (3) MG/3ML nebulizer solution 3 mL, 3 mL, Nebulization, Q6H PRN, Jennye Boroughs, MD .  isosorbide mononitrate (IMDUR) 24 hr tablet 30 mg, 30 mg, Oral, Daily, Jennye Boroughs, MD, 30 mg at 07/17/20 0913 .  mometasone-formoterol (DULERA) 100-5 MCG/ACT inhaler 2 puff, 2 puff, Inhalation, Q12H, Jennye Boroughs, MD, 2 puff at 07/17/20 0915 .  nitroGLYCERIN (NITROSTAT) SL tablet 0.4 mg, 0.4 mg, Sublingual, Q5 min  PRN, Jennye Boroughs, MD .  ondansetron (ZOFRAN) tablet 4 mg, 4 mg, Oral, Q6H PRN **OR** ondansetron (ZOFRAN) injection 4 mg, 4 mg, Intravenous, Q6H PRN, Judd Gaudier V, MD .  oxyCODONE (Oxy IR/ROXICODONE) immediate release tablet 5 mg, 5 mg, Oral, Q6H PRN, Jennye Boroughs, MD, 5 mg at 07/17/20 1314 .  pantoprazole (PROTONIX) injection 40 mg, 40 mg, Intravenous, Q24H, Athena Masse, MD, 40 mg at 07/17/20 2993  Family History  Problem Relation Age of Onset  . Heart failure Mother   . Heart attack Mother        mother died at 66 of an MI  . Heart attack Father        father died at 51 of an MI  . Heart attack Brother        Brother had an MI in his 50s     Social History   Tobacco Use  . Smoking status: Current Some Day Smoker    Packs/day: 1.00    Years: 46.00    Pack years: 46.00    Types: Cigarettes  . Smokeless tobacco: Never Used  . Tobacco comment: she feels that the 21 nicotine made her jittery and we suggested a lower level patch  Vaping Use  . Vaping Use: Never used  Substance Use Topics  . Alcohol use: No  . Drug use: Yes    Types: Marijuana    Comment: monthly    Allergies as of 07/16/2020 - Review Complete 07/16/2020  Allergen Reaction Noted  . Chantix [varenicline] Hives, Shortness Of Breath, and Palpitations 09/06/2012  . Diphenhydramine hcl Shortness Of Breath   . Potassium Itching and Swelling 11/19/2017  . Benadryl [diphenhydramine hcl] Rash 10/14/2011  . Niacin Rash   . Trazodone Palpitations 05/23/2015    Review of Systems:    All systems reviewed and negative except where noted in HPI.   Physical Exam:  Vital signs in last 24 hours: Temp:  [97.5 F (36.4 C)-98.5 F (36.9 C)] 98.5 F (36.9 C) (08/10 1118) Pulse Rate:  [64-78] 68 (08/10 1118) Resp:  [14-20] 15 (08/10 0600) BP: (126-200)/(61-89) 137/68 (08/10 1118) SpO2:  [96 %-100 %] 96 % (08/10 1118) Weight:  [64 kg] 64 kg (08/09 1615)   General:   Pleasant, cooperative in NAD Head:   Normocephalic and atraumatic. Eyes:   No icterus.   Conjunctiva pale. PERRLA. Ears:  Normal auditory acuity. Neck:  Supple; no masses or thyroidomegaly Lungs: Respirations even and unlabored. Lungs clear to auscultation bilaterally.   No wheezes, crackles, or rhonchi.  Heart:  Regular rate and rhythm;  Without murmur, clicks, rubs or gallops Abdomen:  Soft, nondistended, nontender. Normal bowel sounds. No appreciable masses or hepatomegaly.  No rebound or guarding.  Rectal:  Not performed. Msk:  Symmetrical without gross deformities.  Strength generalized weakness Extremities:  Without edema, cyanosis or clubbing. Neurologic:  Alert and oriented x3;  grossly normal neurologically. Skin:  Intact without significant lesions or rashes. Psych:  Alert and cooperative. Normal affect.  LAB RESULTS: CBC Latest Ref Rng & Units 07/17/2020 07/17/2020 07/16/2020  WBC 4.0 - 10.5 K/uL - - 7.9  Hemoglobin 12.0 - 15.0 g/dL 8.7(L) 8.1(L) 7.2(L)  Hematocrit 36 - 46 % 28.5(L) 26.8(L) 25.6(L)  Platelets 150 - 400 K/uL - - 280    BMET BMP Latest Ref Rng & Units 07/16/2020 07/12/2020 04/04/2020  Glucose 70 - 99 mg/dL 61(L) 68(L) 88  BUN 8 - 23 mg/dL '11 8 15  ' Creatinine 0.44 - 1.00 mg/dL 0.88 0.88 0.99  Sodium 135 - 145 mmol/L 140 138 132(L)  Potassium 3.5 - 5.1 mmol/L 3.1(L) 3.3(L) 3.6  Chloride 98 - 111 mmol/L 107 104 100  CO2 22 - 32 mmol/L '26 24 24  ' Calcium 8.9 - 10.3 mg/dL 8.7(L) 8.9 8.7(L)    LFT Hepatic Function Latest Ref Rng & Units 11/13/2019 05/16/2019 11/19/2018  Total Protein 6.5 - 8.1 g/dL 7.6 6.7 7.2  Albumin 3.5 - 5.0 g/dL 4.2 3.6 4.0  AST 15 - 41 U/L '30 18 18  ' ALT 0 - 44 U/L '29 14 11  ' Alk Phosphatase 38 - 126 U/L 99 93 99  Total Bilirubin 0.3 - 1.2 mg/dL 0.5 0.6 0.4  Bilirubin, Direct 0.0 - 0.3 mg/dL - - -     STUDIES: DG Chest 2 View  Result Date: 07/16/2020 CLINICAL DATA:  Weakness. EXAM: CHEST - 2 VIEW COMPARISON:  None. FINDINGS: The heart size and mediastinal contours are within  normal limits. Both lungs are clear. The visualized skeletal structures are unremarkable. There are atherosclerotic changes of the thoracic aorta. IMPRESSION: No active cardiopulmonary disease. Electronically Signed   By: Constance Holster M.D.   On: 07/16/2020 16:51      Impression / Plan:   Traci Duran is a 64 y.o. female with history of coronary artery disease, pulmonary hypertension, heavy tobacco use, known history of iron deficiency anemia, history of small bowel AVMs and colonic AVMs is admitted with severe symptomatic anemia with no evidence of active GI bleed  Iron deficiency anemia, normal BUN/creatinine ratio No active GI bleed at this time With known history of small bowel AVMs, recommend push enteroscopy tomorrow Agree with clear liquid diet today N.p.o. past midnight Patient will need close follow-up with GI and hematology as outpatient  Thank you for involving me in the care of this patient.  GI will follow along with you    LOS: 0 days   Sherri Sear, MD  07/17/2020, 4:07 PM   Note: This dictation was prepared with Dragon dictation along with smaller phrase technology. Any transcriptional errors that result from this process are unintentional.

## 2020-07-18 ENCOUNTER — Encounter: Payer: Self-pay | Admitting: Internal Medicine

## 2020-07-18 ENCOUNTER — Encounter
Admission: EM | Disposition: A | Discharge status: Home / Self Care | Payer: Self-pay | Source: Home / Self Care | Attending: Internal Medicine

## 2020-07-18 ENCOUNTER — Inpatient Hospital Stay: Payer: Medicare Other | Admitting: Anesthesiology

## 2020-07-18 HISTORY — PX: ENTEROSCOPY: SHX5533

## 2020-07-18 LAB — BASIC METABOLIC PANEL WITH GFR
Anion gap: 7 (ref 5–15)
BUN: 7 mg/dL — ABNORMAL LOW (ref 8–23)
CO2: 25 mmol/L (ref 22–32)
Calcium: 8.5 mg/dL — ABNORMAL LOW (ref 8.9–10.3)
Chloride: 109 mmol/L (ref 98–111)
Creatinine, Ser: 0.64 mg/dL (ref 0.44–1.00)
GFR calc Af Amer: >60 mL/min (ref >60)
GFR calc non Af Amer: >60 mL/min (ref >60)
Glucose, Bld: 81 mg/dL (ref 70–99)
Potassium: 3.8 mmol/L (ref 3.5–5.1)
Sodium: 141 mmol/L (ref 135–145)

## 2020-07-18 LAB — TROPONIN I (HIGH SENSITIVITY): Troponin I (High Sensitivity): 3 ng/L (ref <18)

## 2020-07-18 LAB — TYPE AND SCREEN
ABO/RH(D): O POS
Antibody Screen: NEGATIVE
Unit division: 0

## 2020-07-18 LAB — RETICULOCYTES
Immature Retic Fract: 19.8 % — ABNORMAL HIGH (ref 2.3–15.9)
RBC.: 3.3 MIL/uL — ABNORMAL LOW (ref 3.87–5.11)
Retic Count, Absolute: 38.3 10*3/uL (ref 19.0–186.0)
Retic Ct Pct: 1.2 % (ref 0.4–3.1)

## 2020-07-18 LAB — HEMOGLOBIN AND HEMATOCRIT, BLOOD
HCT: 24.8 % — ABNORMAL LOW (ref 36.0–46.0)
HCT: 25.3 % — ABNORMAL LOW (ref 36.0–46.0)
Hemoglobin: 7.5 g/dL — ABNORMAL LOW (ref 12.0–15.0)
Hemoglobin: 7.3 g/dL — ABNORMAL LOW (ref 12.0–15.0)

## 2020-07-18 LAB — IRON AND TIBC
Iron: 12 ug/dL — ABNORMAL LOW (ref 28–170)
Saturation Ratios: 4 % — ABNORMAL LOW (ref 10.4–31.8)
TIBC: 309 ug/dL (ref 250–450)
UIBC: 297 ug/dL

## 2020-07-18 LAB — VITAMIN B12: Vitamin B-12: 187 pg/mL (ref 180–914)

## 2020-07-18 LAB — BPAM RBC
Blood Product Expiration Date: 202109102359
ISSUE DATE / TIME: 202108100328
Unit Type and Rh: 5100

## 2020-07-18 LAB — FERRITIN: Ferritin: 9 ng/mL — ABNORMAL LOW (ref 11–307)

## 2020-07-18 LAB — MAGNESIUM: Magnesium: 1.9 mg/dL (ref 1.7–2.4)

## 2020-07-18 LAB — FOLATE: Folate: 11.4 ng/mL (ref >5.9)

## 2020-07-18 SURGERY — ENTEROSCOPY
Anesthesia: General

## 2020-07-18 MED ORDER — SODIUM CHLORIDE 0.9 % IV SOLN: 510.0000 mg | Freq: Once | INTRAVENOUS | Status: DC

## 2020-07-18 MED ORDER — PROPOFOL 500 MG/50ML IV EMUL
INTRAVENOUS | Status: DC | PRN
  Administered 2020-07-18: 60 ug/kg/min via INTRAVENOUS

## 2020-07-18 MED ORDER — LIDOCAINE HCL (CARDIAC) PF 100 MG/5ML IV SOSY
PREFILLED_SYRINGE | INTRAVENOUS | Status: DC | PRN
  Administered 2020-07-18: 50 mg via INTRAVENOUS

## 2020-07-18 MED ORDER — SODIUM CHLORIDE 0.9 % IV SOLN
510.0000 mg | Freq: Once | INTRAVENOUS | Status: AC
  Administered 2020-07-18: 510 mg via INTRAVENOUS
  Filled 2020-07-18: qty 17

## 2020-07-18 MED ORDER — SODIUM CHLORIDE 0.9 % IV SOLN: INTRAVENOUS | Status: DC

## 2020-07-18 MED ORDER — SPOT INK MARKER SYRINGE KIT
PACK | SUBMUCOSAL | Status: DC | PRN
  Administered 2020-07-18: 1 mL via SUBMUCOSAL

## 2020-07-18 MED ORDER — PROPOFOL 500 MG/50ML IV EMUL
INTRAVENOUS | Status: AC
  Filled 2020-07-18: qty 50

## 2020-07-18 MED ORDER — PROPOFOL 10 MG/ML IV BOLUS
INTRAVENOUS | Status: DC | PRN
  Administered 2020-07-18: 30 mg via INTRAVENOUS
  Administered 2020-07-18: 50 mg via INTRAVENOUS

## 2020-07-18 MED ORDER — PANTOPRAZOLE SODIUM 40 MG PO TBEC
40.0000 mg | DELAYED_RELEASE_TABLET | Freq: Two times a day (BID) | ORAL | Status: DC
  Administered 2020-07-18 – 2020-07-19 (×2): 40 mg via ORAL
  Filled 2020-07-18 (×2): qty 1

## 2020-07-18 NOTE — Progress Notes (Signed)
PROGRESS NOTE  SEQUOYAH PIESTER Duran:[id number] DOB: March 11, 1956 DOA: 07/17/2020 PCP: [name]  Brief History   Traci Duran is a 64 y.o. female with medical history significant for GI bleed and symptomatic anemia requiring blood transfusion with extensive work-up with upper and lower endoscopy as well as capsule endoscopy in November 2020, as well as history of anxiety, CAD, diastolic CHF, HTN, who presented 5 days to the emergency room with dizziness and lightheadedness but left without being seen due to long ER wait time who went to her cardiologist office today and was found to be hypotensive with BPs 96/60 and was sent back to the ER for evaluation.  Patient denies chest pain, abdominal pain fever or chills.  Denied nausea, coffee-ground emesis or hematemesis, bloody or melanotic stool, abdominal pain. ED Course: On arrival she was hemodynamically stable with BP 138/72, pulse of 66, otherwise normal vitals.  Hemoglobin 7.2, about the same as 5 days prior when it was 7.3.  Hemoglobin 3 months prior was 10.5.  Other labs unremarkable.  Chest x-ray clear and EKG showed sinus rhythm.  Hospitalist consulted for admission.  On capsule endoscopy from admission in 09/2019 the patient was found to have multiple AVM's in esoophagus, small bowel, and ascending colon.  FOBT was negative. GI was consulted. The patient underwent push enteroscopy today. No significant pathology was found in the proximal jejunum and in the mid-jejunum, nor in the entire examined duodenum. There was a medium-sized hiatal hernia present. JE junction and esophagus were normal.   The patient was returned to her room and will be returned to a diet. She is to follow up with GI as outpatient in 4 weeks. Will follow hemoglobin overnight.  Iron studies have been repeated and demonstrate severe iron deficiency anemia.  Consultants   Gastroenterology  Procedures   Small bowel enteroscopy.  Antibiotics    Anti-infectives (From admission, onward)   None      Subjective  This patient was seen prior to her procedure. She had no new complaints.  Objective   Vitals:  Vitals:   07/18/20 1416 07/18/20 1518  BP:  (!) 152/80  Pulse:  82  Resp:  19  Temp: 98.3 F (36.8 C) (!) 97.5 F (36.4 C)  SpO2:  92%   Exam:  Constitutional:   The patient is awake and alert. No acute distress. Respiratory:   No increased work of breathing.  No wheezes, rales, or rhonchi  No tactile fremitus Cardiovascular:   Regular rate and rhythm  No murmurs, ectopy, or gallups.  No lateral PMI. No thrills. Abdomen:   Abdomen is soft, non-tender, non-distended  No hernias, masses, or organomegaly  Normoactive bowel sounds.  Musculoskeletal:   No cyanosis, clubbing, or edema Skin:   No rashes, lesions, ulcers  palpation of skin: no induration or nodules Neurologic:   CN 2-12 intact  Sensation all 4 extremities intact Psychiatric:   Mental status o Mood, affect appropriate o Orientation to person, place, time   judgment and insight appear intact   I have personally reviewed the following:   Today's Data   Vitals, Anemia panel, hemoglobin and hematocrit.  Scheduled Meds:  alprazolam  2 mg Oral BID   atorvastatin  80 mg Oral Daily   busPIRone  10 mg Oral BID   carvedilol  6.25 mg Oral BID   cilostazol  100 mg Oral BID   citalopram  40 mg Oral Daily   DULoxetine  30 mg Oral Daily  furosemide  40 mg Oral Daily   gabapentin  300 mg Oral BID   isosorbide mononitrate  30 mg Oral Daily   mometasone-formoterol  2 puff Inhalation Q12H   pantoprazole  40 mg Oral BID AC   Continuous Infusions:  Principal Problem:   Symptomatic anemia Active Problems:   Chronic obstructive pulmonary disease (HCC)   Coronary artery disease, non-occlusive   Cerebrovascular disease   Diastolic dysfunction   Occult GI bleeding   History of GI bleed   LOS: 1 day    A & P   Symptomatic anemia: Patient presents with dizziness, hypotension of 96/60 in the outpatient setting but hemodynamically stable on evaluation in the ER, Hb 7.2 stable from 5 days prior.  Stool for FIT stool guaiac negative.Occult GI blood loss was suspected. FOBT was negative. However, in 09/2019 the patient had capsule endoscopy that revealed multiple AVM's in esoophagus, small bowel, and ascending colon. She received 1 unit of blood in the ED as well as IV hydration. GI was consulted and the patient underwent small bowel enterography today. It did not demonstrate any source of bleeding. Anemia panel was performed that demonstrated severe iron deficiency. I will give the patient a Feraheme infusion and monitor her hemoglobin which remains marginal despite transfusion of one unit overnight. She has been returned to a diet as per GI recommendations.  Chronic obstructive pulmonary disease (HCC): noted and stable. Continue home inhalers.  DuoNebs as needed are available.  Coronary artery disease, non-occlusive: Noted and stable. Plan to resume statins.  Cerebrovascular disease: Dizziness suspect related to symptomatic anemia. TIA not suspected. Will have patient evaluated by PT/OT prior to discharge.  Diastolic dysfunction: Noted and stable. Monitor volume status.  I have seen and examined this patient myself. I have spent 36 minutes in her evaluation and care.   DVT prophylaxis: SCDs Code Status: full code  Family Communication:  none  Disposition Plan: Back to previous home environment  Status is: Inpatient  Remains inpatient appropriate because:IV treatments appropriate due to intensity of illness or inability to take PO   Dispo: The patient is from: Home              Anticipated d/c is to: Home              Anticipated d/c date is: 1 day              Patient currently is not medically stable to d/c.  [name], DO [facility] Direct contact: see [email or web address]  7PM-7AM  contact night coverage as above 07/18/2020, 6:47 PM  LOS: 1 day

## 2020-07-18 NOTE — Plan of Care (Signed)

## 2020-07-18 NOTE — Transfer of Care (Signed)
Immediate Anesthesia Transfer of Care Note  Patient: Traci Duran  Procedure(s) Performed: ENTEROSCOPY (N/A )  Patient Location: PACU and Endoscopy Unit  Anesthesia Type:General  Level of Consciousness: drowsy  Airway & Oxygen Therapy: Patient Spontanous Breathing  Post-op Assessment: Report given to RN  Post vital signs: stable  Last Vitals:  Vitals Value Taken Time  BP    Temp    Pulse    Resp    SpO2      Last Pain:  Vitals:   07/18/20 1336  TempSrc: Temporal  PainSc: 0-No pain         Complications: No complications documented.

## 2020-07-18 NOTE — Anesthesia Preprocedure Evaluation (Addendum)
Anesthesia Evaluation  Patient identified by MRN, date of birth, ID band Patient awake    Reviewed: Allergy & Precautions, H&P , NPO status , Patient's Chart, lab work & pertinent test results  History of Anesthesia Complications Negative for: history of anesthetic complications  Airway Mallampati: III  TM Distance: >3 FB Neck ROM: limited    Dental  (+) Chipped, Poor Dentition   Pulmonary COPD, Current Smoker,    Pulmonary exam normal        Cardiovascular Exercise Tolerance: Good hypertension, (-) angina+ CAD, + Past MI, + Peripheral Vascular Disease and +CHF  Normal cardiovascular exam+ Valvular Problems/Murmurs      Neuro/Psych PSYCHIATRIC DISORDERS negative neurological ROS     GI/Hepatic Neg liver ROS, GERD  Medicated and Controlled,  Endo/Other  negative endocrine ROS  Renal/GU negative Renal ROS  negative genitourinary   Musculoskeletal   Abdominal   Peds  Hematology negative hematology ROS (+)   Anesthesia Other Findings Past Medical History: No date: Anemia No date: Anxiety No date: CHF (congestive heart failure) (HCC) No date: COPD (chronic obstructive pulmonary disease) (HCC) No date: Coronary artery disease No date: Depression No date: Gastritis No date: GERD (gastroesophageal reflux disease) No date: Hemorrhoids No date: Hyperlipidemia No date: Hypertension No date: Myocardial infarction ([place]) No date: PAD (peripheral artery disease) ([place])  Past Surgical History: No date: APPENDECTOMY 06/09/2017: COLONOSCOPY WITH PROPOFOL; N/A     Comment:  Procedure: COLONOSCOPY WITH PROPOFOL;  Surgeon: Lucilla Lame, MD;  Location: [facility] ENDOSCOPY;  Service:               Endoscopy;  Laterality: N/A; 10/08/2019: COLONOSCOPY WITH PROPOFOL; N/A     Comment:  Procedure: COLONOSCOPY WITH PROPOFOL;  Surgeon: Lucilla Lame, MD;  Location: [facility] ENDOSCOPY;  Service:                Endoscopy;  Laterality: N/A; 10/08/2019: ENTEROSCOPY; N/A     Comment:  Procedure: ENTEROSCOPY;  Surgeon: Lucilla Lame, MD;                Location: [facility] ENDOSCOPY;  Service: Endoscopy;                Laterality: N/A; 07/30/2017: ESOPHAGOGASTRODUODENOSCOPY; N/A     Comment:  Procedure: ESOPHAGOGASTRODUODENOSCOPY (EGD);  Surgeon:               Lin Landsman, MD;  Location: [facility] ENDOSCOPY;                Service: Gastroenterology;  Laterality: N/A; 04/09/2017: ESOPHAGOGASTRODUODENOSCOPY (EGD) WITH PROPOFOL; N/A     Comment:  Procedure: ESOPHAGOGASTRODUODENOSCOPY (EGD) WITH               PROPOFOL;  Surgeon: Lucilla Lame, MD;  Location: [facility]               ENDOSCOPY;  Service: Endoscopy;  Laterality: N/A; 10/08/2019: GIVENS CAPSULE STUDY; N/A     Comment:  Procedure: GIVENS CAPSULE STUDY;  Surgeon: Lucilla Lame,              MD;  Location: [facility] ENDOSCOPY;  Service: Endoscopy;                Laterality: N/A; No date: HIP FRACTURE SURGERY  BMI    Body Mass Index: 21.74 kg/m      Reproductive/Obstetrics  negative OB ROS                            Anesthesia Physical Anesthesia Plan  ASA: IV  Anesthesia Plan: General   Post-op Pain Management:    Induction: Intravenous  PONV Risk Score and Plan: Propofol infusion and TIVA  Airway Management Planned: Natural Airway and Nasal Cannula  Additional Equipment:   Intra-op Plan:   Post-operative Plan:   Informed Consent: I have reviewed the patients History and Physical, chart, labs and discussed the procedure including the risks, benefits and alternatives for the proposed anesthesia with the patient or authorized representative who has indicated his/her understanding and acceptance.     Dental Advisory Given  Plan Discussed with: Anesthesiologist, CRNA and Surgeon  Anesthesia Plan Comments: (Patient consented for risks of anesthesia including but not limited to:  - adverse reactions to medications -  risk of intubation if required - damage to eyes, teeth, lips or other oral mucosa - nerve damage due to positioning  - sore throat or hoarseness - Damage to heart, brain, nerves, lungs, other parts of body or loss of life  Patient voiced understanding.)       Anesthesia Quick Evaluation

## 2020-07-18 NOTE — Op Note (Signed)
[facility] Gastroenterology Patient Name: Traci Duran Procedure Date: 07/18/2020 1:37 PM MRN: [id number] Account #: [id number] Date of Birth: [birth year] Admit Type: Outpatient Age: 64 Room: [facility] ENDO ROOM 4 Gender: Female Note Status: Finalized Procedure:             Small bowel enteroscopy Indications:           Obscure gastrointestinal bleeding, Gastrointestinal                         bleeding of unknown origin, Arteriovenous malformation                         in the small intestine Providers:             Toney Reil MD, MD Medicines:             Monitored Anesthesia Care Complications:         No immediate complications. Estimated blood loss: None. Procedure:             Pre-Anesthesia Assessment:                        - Prior to the procedure, a History and Physical was                         performed, and patient medications and allergies were                         reviewed. The patient is competent. The risks and                         benefits of the procedure and the sedation options and                         risks were discussed with the patient. All questions                         were answered and informed consent was obtained.                         Patient identification and proposed procedure were                         verified by the physician, the nurse, the                         anesthesiologist, the anesthetist and the technician                         in the pre-procedure area in the procedure room in the                         endoscopy suite. Mental Status Examination: alert and                         oriented. Airway Examination: normal oropharyngeal                         airway and neck mobility. Respiratory Examination:  clear to auscultation. CV Examination: normal.                         Prophylactic Antibiotics: The patient does not require                         prophylactic  antibiotics. Prior Anticoagulants: The                         patient has taken no previous anticoagulant or                         antiplatelet agents. ASA Grade Assessment: III - A                         patient with severe systemic disease. After reviewing                         the risks and benefits, the patient was deemed in                         satisfactory condition to undergo the procedure. The                         anesthesia plan was to use monitored anesthesia care                         (MAC). Immediately prior to administration of                         medications, the patient was re-assessed for adequacy                         to receive sedatives. The heart rate, respiratory                         rate, oxygen saturations, blood pressure, adequacy of                         pulmonary ventilation, and response to care were                         monitored throughout the procedure. The physical                         status of the patient was re-assessed after the                         procedure.                        After obtaining informed consent, the endoscope was                         passed under direct vision. Throughout the procedure,                         the patient's blood pressure, pulse, and oxygen  saturations were monitored continuously. The                         Enteroscope was introduced through the mouth and                         advanced to the mid-jejunum. The small bowel                         enteroscopy was accomplished without difficulty. The                         patient tolerated the procedure well. Findings:      There was no evidence of significant pathology in the proximal jejunum       and in the mid-jejunum. Area was tattooed with an injection of Spot       (carbon black).      There was no evidence of significant pathology in the entire examined       duodenum.      A medium-sized hiatal  hernia was present.      The gastroesophageal junction and examined esophagus were normal. Impression:            - The examined portion of the jejunum was normal.                         Tattooed.                        - Normal examined duodenum.                        - Medium-sized hiatal hernia.                        - Normal gastroesophageal junction and esophagus.                        - No specimens collected. Recommendation:        - Return patient to hospital ward for ongoing care.                        - Resume regular diet today.                        - Return to GI office in 4 weeks. Procedure Code(s):     --- Professional ---                        [phone], Small intestinal endoscopy, enteroscopy beyond                         second portion of duodenum, not including ileum;                         diagnostic, including collection of specimen(s) by                         brushing or washing, when performed (separate  procedure)                        [phone], Unlisted procedure, small intestine Diagnosis Code(s):     --- Professional ---                        K44.9, Diaphragmatic hernia without obstruction or                         gangrene                        K92.2, Gastrointestinal hemorrhage, unspecified                        K31.819, Angiodysplasia of stomach and duodenum                         without bleeding CPT copyright 2019 American Medical Association. All rights reserved. The codes documented in this report are preliminary and upon coder review may  be revised to meet current compliance requirements. Dr. Libby Maw Toney Reil MD, MD 07/18/2020 2:13:13 PM This report has been signed electronically. Number of Addenda: 0 Note Initiated On: 07/18/2020 1:37 PM Estimated Blood Loss:  Estimated blood loss: none.      [facility]

## 2020-07-19 ENCOUNTER — Encounter: Payer: Self-pay | Admitting: Gastroenterology

## 2020-07-19 DIAGNOSIS — D509 Iron deficiency anemia, unspecified: Principal | ICD-10-CM

## 2020-07-19 LAB — CBC
HCT: 26.2 % — ABNORMAL LOW (ref 36.0–46.0)
Hemoglobin: 7.8 g/dL — ABNORMAL LOW (ref 12.0–15.0)
MCH: 22.4 pg — ABNORMAL LOW (ref 26.0–34.0)
MCHC: 29.8 g/dL — ABNORMAL LOW (ref 30.0–36.0)
MCV: 75.3 fL — ABNORMAL LOW (ref 80.0–100.0)
Platelets: 226 10*3/uL (ref 150–400)
RBC: 3.48 MIL/uL — ABNORMAL LOW (ref 3.87–5.11)
RDW: 17.5 % — ABNORMAL HIGH (ref 11.5–15.5)
WBC: 6.2 10*3/uL (ref 4.0–10.5)
nRBC: 0 % (ref 0.0–0.2)

## 2020-07-19 MED ORDER — CYANOCOBALAMIN 1000 MCG PO TABS: 1000.0000 ug | ORAL_TABLET | Freq: Every day | ORAL | 0 refills | Status: DC

## 2020-07-19 MED ORDER — FERROUS GLUCONATE 324 (38 FE) MG PO TABS
324.0000 mg | ORAL_TABLET | Freq: Every day | ORAL | 0 refills | Status: DC
Start: 2020-07-19 — End: 2022-10-12

## 2020-07-19 MED ORDER — VITAMIN B-12 1000 MCG PO TABS
1000.0000 ug | ORAL_TABLET | Freq: Every day | ORAL | Status: DC
  Administered 2020-07-19: 1000 ug via ORAL
  Filled 2020-07-19: qty 1

## 2020-07-19 MED ORDER — SODIUM CHLORIDE 0.9 % IV SOLN: 300.0000 mg | Freq: Once | INTRAVENOUS | Status: DC

## 2020-07-19 NOTE — Anesthesia Postprocedure Evaluation (Signed)
Anesthesia Post Note  Patient: Traci Duran  Procedure(s) Performed: ENTEROSCOPY (N/A )  Patient location during evaluation: Endoscopy Anesthesia Type: General Level of consciousness: awake and alert Pain management: pain level controlled Vital Signs Assessment: post-procedure vital signs reviewed and stable Respiratory status: spontaneous breathing, nonlabored ventilation, respiratory function stable and patient connected to nasal cannula oxygen Cardiovascular status: blood pressure returned to baseline and stable Postop Assessment: no apparent nausea or vomiting Anesthetic complications: no   No complications documented.   Last Vitals:  Vitals:   07/19/20 0345 07/19/20 0725  BP: 105/66 126/73  Pulse: 73 73  Resp: 20 17  Temp: 36.9 C 36.5 C  SpO2: 92% 97%    Last Pain:  Vitals:   07/19/20 0345  TempSrc: Oral  PainSc:                  [name]

## 2020-07-19 NOTE — Progress Notes (Signed)
PT Cancellation Note  Patient Details Name: [name] MRN: [id number] DOB: 10-26-1956   Cancelled Treatment:    Reason Eval/Treat Not Completed: PT screened, no needs identified, will sign off. Patient up ambulating to bathroom multiple times a day independently. No needs identified at this time. Please re-consult if need arises.    Wallis Vancott 07/19/2020, 11:03 AM

## 2020-07-19 NOTE — Progress Notes (Signed)
OT Screen Note  Patient Details Name: Traci Duran MRN: [id number] DOB: 06-Sep-1956   Cancelled Treatment:    Reason Eval/Treat Not Completed: OT screened, no needs identified, will sign off. Order received and chart reviewed. Spoke with primary physical therapist who indicated pt is up ad lib in room. Using the bathroom independently, etc. No skilled OT needs identified. Will sign off at this time. Please re-consult if additional OT needs arise during this admission.   Shara Blazing, M.S., OTR/L Ascom: [phone] 07/19/20, 11:55 AM

## 2020-07-19 NOTE — Progress Notes (Signed)
Traci Duran to be D/C'd Home per MD order.  Discussed prescriptions and follow up appointments with the patient. Prescriptions were e-prescribed medication list explained in detail. Pt verbalized understanding.  Allergies as of 07/19/2020      Reactions   Chantix [varenicline] Hives, Shortness Of Breath, Palpitations   Diphenhydramine Hcl Shortness Of Breath   Potassium Itching, Swelling   Benadryl [diphenhydramine Hcl] Rash   Niacin Rash   Trazodone Palpitations      Medication List    STOP taking these medications   acetaminophen 325 MG tablet Commonly known as: TYLENOL     TAKE these medications   albuterol (2.5 MG/3ML) 0.083% nebulizer solution Commonly known as: PROVENTIL Take 3 mLs (2.5 mg total) by nebulization every 6 (six) hours as needed for wheezing or shortness of breath.   alprazolam 2 MG tablet Commonly known as: XANAX Take 2 mg by mouth 2 (two) times daily.   atorvastatin 80 MG tablet Commonly known as: LIPITOR Take 80 mg by mouth daily.   busPIRone 10 MG tablet Commonly known as: BUSPAR Take 10 mg by mouth 2 (two) times daily.   carvedilol 6.25 MG tablet Commonly known as: COREG Take 6.25 mg by mouth 2 (two) times daily.   cilostazol 100 MG tablet Commonly known as: PLETAL Take 1 tablet (100 mg total) by mouth 2 (two) times daily.   citalopram 40 MG tablet Commonly known as: CELEXA Take 40 mg by mouth daily.   cyanocobalamin 1000 MCG tablet Take 1 tablet (1,000 mcg total) by mouth daily.   DULoxetine 30 MG capsule Commonly known as: CYMBALTA Take 30 mg by mouth daily.   ferrous gluconate 324 MG tablet Commonly known as: FERGON Take 1 tablet (324 mg total) by mouth daily with breakfast.   furosemide 40 MG tablet Commonly known as: LASIX Take 1 tablet (40 mg total) by mouth daily.   gabapentin 300 MG capsule Commonly known as: NEURONTIN Take 300 mg by mouth 2 (two) times daily.   ipratropium-albuterol 0.5-2.5 (3) MG/3ML  Soln Commonly known as: DUONEB Take 3 mLs by nebulization every 6 (six) hours as needed.   isosorbide mononitrate 30 MG 24 hr tablet Commonly known as: IMDUR Take 1 tablet (30 mg total) by mouth daily.   nitroGLYCERIN 0.4 MG SL tablet Commonly known as: NITROSTAT Place 0.4 mg under the tongue as needed.   pantoprazole 40 MG tablet Commonly known as: PROTONIX Take 1 tablet (40 mg total) by mouth 2 (two) times daily before a meal.   Symbicort 80-4.5 MCG/ACT inhaler Generic drug: budesonide-formoterol Inhale 2 puffs into the lungs 2 (two) times daily.   Vitamin D (Ergocalciferol) 1.25 MG (50000 UNIT) Caps capsule Commonly known as: DRISDOL Take 50,000 Units by mouth every Saturday.       Vitals:   07/19/20 0725 07/19/20 1216  BP: 126/73 106/63  Pulse: 73 74  Resp: 17 18  Temp: 97.7 F (36.5 C)   SpO2: 97% 92%    Tele box removed and returned. Skin clean, dry and intact without evidence of skin break down, no evidence of skin tears noted. IV catheter discontinued intact. Site without signs and symptoms of complications. Dressing and pressure applied. Pt denies pain at this time. No complaints noted.  An After Visit Summary was printed and given to the patient. Patient escorted via [place], and D/C home via private auto.  Traci Duran

## 2020-07-19 NOTE — Progress Notes (Signed)
[name], MD [address]  [place]  [place], [place] [address]  Main: [phone]  Fax: [phone] Pager: [phone]   Subjective: No acute events overnight.  Patient underwent patient enteroscopy yesterday, there was no evidence of small bowel AVMs.  Hemoglobin is 7.8 today   Objective: Vital signs in last 24 hours: Vitals:   07/18/20 2129 07/19/20 0345 07/19/20 0725 07/19/20 1216  BP: 121/78 105/66 126/73 106/63  Pulse: 72 73 73 74  Resp: 20 20 17 18   Temp: 97.9 F (36.6 C) 98.5 F (36.9 C) 97.7 F (36.5 C)   TempSrc: Oral Oral Oral   SpO2: 94% 92% 97% 92%  Weight:  135 lb 8 oz (61.5 kg)    Height:       Weight change: 0 lb (0 kg)  Intake/Output Summary (Last 24 hours) at 07/19/2020 1550 Last data filed at 07/19/2020 1350 Gross per 24 hour  Intake 1296.09 ml  Output 450 ml  Net 846.09 ml     Exam: Heart:: Regular rate and rhythm, S1S2 present or without murmur or extra heart sounds Lungs: normal and clear to auscultation Abdomen: soft, nontender, normal bowel sounds   Lab Results: CBC Latest Ref Rng & Units 07/19/2020 07/18/2020 07/18/2020  WBC 4.0 - 10.5 K/uL 6.2 - -  Hemoglobin 12.0 - 15.0 g/dL 7.8(L) 7.3(L) 7.5(L)  Hematocrit 36 - 46 % 26.2(L) 25.3(L) 24.8(L)  Platelets 150 - 400 K/uL 226 - -   CMP Latest Ref Rng & Units 07/18/2020 07/16/2020 07/12/2020  Glucose 70 - 99 mg/dL 81 61(L) 68(L)  BUN 8 - 23 mg/dL 7(L) 11 8  Creatinine 0.44 - 1.00 mg/dL 0.64 0.88 0.88  Sodium 135 - 145 mmol/L 141 140 138  Potassium 3.5 - 5.1 mmol/L 3.8 3.1(L) 3.3(L)  Chloride 98 - 111 mmol/L 109 107 104  CO2 22 - 32 mmol/L 25 26 24   Calcium 8.9 - 10.3 mg/dL 8.5(L) 8.7(L) 8.9  Total Protein 6.5 - 8.1 g/dL - - -  Total Bilirubin 0.3 - 1.2 mg/dL - - -  Alkaline Phos 38 - 126 U/L - - -  AST 15 - 41 U/L - - -  ALT 0 - 44 U/L - - -    Micro Results: Recent Results (from the past 240 hour(s))  SARS Coronavirus 2 by RT PCR (hospital order, performed in [facility] hospital lab) Nasopharyngeal Nasopharyngeal Swab     Status: None   Collection Time: 07/17/20  2:21 AM   Specimen: Nasopharyngeal Swab  Result Value Ref Range Status   SARS Coronavirus 2 NEGATIVE NEGATIVE Final    Comment: (NOTE) SARS-CoV-2 target nucleic acids are NOT DETECTED.  The SARS-CoV-2 RNA is generally detectable in upper and lower respiratory specimens during the acute phase of infection. The lowest concentration of SARS-CoV-2 viral copies this assay can detect is 250 copies / mL. A negative result does not preclude SARS-CoV-2 infection and should not be used as the sole basis for treatment or other patient management decisions.  A negative result may occur with improper specimen collection / handling, submission of specimen other than nasopharyngeal swab, presence of viral mutation(s) within the areas targeted by this assay, and inadequate number of viral copies (<250 copies / mL). A negative result must be combined with clinical observations, patient history, and epidemiological information.  Fact Sheet for Patients:   StrictlyIdeas.no  Fact Sheet for Healthcare Providers: BankingDealers.co.za  This test is not yet approved or  cleared by the [place]  States FDA and has been authorized for detection and/or diagnosis of SARS-CoV-2 by FDA under an Emergency Use Authorization (EUA).  This EUA will remain in effect (meaning this test can be used) for the duration of the COVID-19 declaration under Section 564(b)(1) of the Act, 21 U.S.C. section 360bbb-3(b)(1), unless the authorization is terminated or revoked sooner.  Performed at [facility], [address], [place], [place] [address]    Studies/Results: No results found. Medications:  I have reviewed the patient's current medications. Prior to Admission:  No medications prior to admission.   Scheduled:  alprazolam  2 mg Oral BID   atorvastatin  80 mg  Oral Daily   busPIRone  10 mg Oral BID   carvedilol  6.25 mg Oral BID   cilostazol  100 mg Oral BID   citalopram  40 mg Oral Daily   DULoxetine  30 mg Oral Daily   furosemide  40 mg Oral Daily   gabapentin  300 mg Oral BID   isosorbide mononitrate  30 mg Oral Daily   mometasone-formoterol  2 puff Inhalation Q12H   pantoprazole  40 mg Oral BID AC   vitamin B-12  1,000 mcg Oral Daily   Continuous:  [START ON 07/20/2020] iron sucrose     BTD:[id number], nitroGLYCERIN, ondansetron **OR** ondansetron (ZOFRAN) IV, oxyCODONE Anti-infectives (From admission, onward)    None      Scheduled Meds:  alprazolam  2 mg Oral BID   atorvastatin  80 mg Oral Daily   busPIRone  10 mg Oral BID   carvedilol  6.25 mg Oral BID   cilostazol  100 mg Oral BID   citalopram  40 mg Oral Daily   DULoxetine  30 mg Oral Daily   furosemide  40 mg Oral Daily   gabapentin  300 mg Oral BID   isosorbide mononitrate  30 mg Oral Daily   mometasone-formoterol  2 puff Inhalation Q12H   pantoprazole  40 mg Oral BID AC   vitamin B-12  1,000 mcg Oral Daily   Continuous Infusions:  [START ON 07/20/2020] iron sucrose     PRN Meds:.ipratropium-albuterol, nitroGLYCERIN, ondansetron **OR** ondansetron (ZOFRAN) IV, oxyCODONE   Assessment: Principal Problem:   Symptomatic anemia Active Problems:   Chronic obstructive pulmonary disease (HCC)   Coronary artery disease, non-occlusive   Cerebrovascular disease   Diastolic dysfunction   Occult GI bleeding   History of GI bleed  Severe iron deficiency anemia, obscure occult GI bleed Known history of gastric and small bowel AVMs, treated in the past Push enteroscopy on 8/11 did not identify any small bowel AVMs, extent reached was tattooed Her anemia is likely multifactorial, combination of noncompliance and slow blood loss from small bowel AVMs Patient does not follow-up with GI No evidence of celiac disease or H. pylori infection based on biopsies in the  past  Plan: Recommend parenteral iron, received Feraheme on 8/11 Start B12 1000 MCG daily by mouth Check antiparietal cell and antiintrinsic factor antibodies Reiterated to the patient regarding close follow-up with GI and hematology  Please refer her to hematology, seen by Dr. Janese Banks in the past Anticipate discharge home today  GI will sign off at this time, please call us back with questions or concerns    LOS: 2 days   Fronia Depass 07/19/2020, 3:50 PM

## 2020-07-19 NOTE — Discharge Summary (Signed)
Physician Discharge Summary  Traci Duran:[id number] DOB: 07-24-1956 DOA: 07/17/2020  PCP: [name]  Admit date: 07/17/2020 Discharge date: 07/19/2020  Recommendations for Outpatient Follow-up:  1. Follow up with PCP in 7-10 days. 2. Follow up with hematology in one week.  3. Have CBC checked at that visit. 4. Follow up with GI as directed. 5. Take Iron tablets daily. Please note, they will make your stools dark/black in color.   Follow-up Information    [facility].              Discharge Diagnoses: Principal diagnosis is #1 1. Symptomatic anemia due to severe iron deficiency anemia and anemia of chronic disease 2. History of AVM's of the small bowel, esophagus, and ascending colon. 3. COPD 4. CAD 5. Diastolic dysfunction 6. Pulmonary artery hypertension  Discharge Condition: Fair  Disposition: Home  Diet recommendation: Heart healthy  Filed Weights   07/18/20 0449 07/18/20 1336 07/19/20 0345  Weight: 63 kg 62.6 kg 61.5 kg    History of present illness: Traci Duran is a 64 y.o. female with medical history significant for GI bleed and symptomatic anemia requiring blood transfusion with extensive work-up with upper and lower endoscopy as well as capsule endoscopy in November 2020, as well as history of anxiety, CAD, diastolic CHF, HTN, who presented 5 days to the emergency room with dizziness and lightheadedness but left without being seen due to long ER wait time who went to her cardiologist office today and was found to be hypotensive with BPs 96/60 and was sent back to the ER for evaluation.  Patient denies chest pain, abdominal pain fever or chills.  Denied nausea, coffee-ground emesis or hematemesis, bloody or melanotic stool, abdominal pain. ED Course: On arrival she was hemodynamically stable with BP 138/72, pulse of 66, otherwise normal vitals.  Hemoglobin 7.2, about the same as 5 days prior when it was 7.3.  Hemoglobin 3  months prior was 10.5.  Other labs unremarkable.  Chest x-ray clear and EKG showed sinus rhythm.  Hospitalist consulted for admission.  Hospital Course: Traci Duran a 64 y.o.femalewith medical history significant forGI bleed and symptomatic anemia requiring blood transfusion with extensive work-up with upper and lower endoscopy as well as capsule endoscopy in November 2020, as well as history of anxiety, CAD, diastolic CHF, HTN, who presented 5 days to the emergency room with dizziness and lightheadedness but left without being seen due to long ER wait time who went to her cardiologist office today and was found to be hypotensive with BPs 96/60 and was sent back to the ER for evaluation. Patient denies chest pain, abdominal pain fever or chills. Denied nausea, coffee-ground emesis or hematemesis, bloody or melanotic stool, abdominal pain. ED Course:On arrival she was hemodynamically stable with BP 138/72, pulse of 66, otherwise normal vitals. Hemoglobin 7.2, about the same as 5 days prior when it was 7.3. Hemoglobin 3 months prior was 10.5. Other labs unremarkable. Chest x-ray clear and EKG showed sinus rhythm. Hospitalist consulted for admission.  On capsule endoscopy from admission in 09/2019 the patient was found to have multiple AVM's in esoophagus, small bowel, and ascending colon.  FOBT was negative. GI was consulted. The patient underwent push enteroscopy today. No significant pathology was found in the proximal jejunum and in the mid-jejunum, nor in the entire examined duodenum. There was a medium-sized hiatal hernia present. JE junction and esophagus were normal.   The patient was returned to her room and will be  returned to a diet. She is to follow up with GI as outpatient in 4 weeks. Will follow hemoglobin overnight.  Iron studies have been repeated and demonstrate severe iron deficiency anemia.The patient has received 510 mg of Feraheme. She has been cleared for  discharge by Dr. Allegra Lai. She will go home on oral iron and follow up with hematology as outpatient. CBC will be checked on her follow up visit with PCP. Hemoglobin on discharge was 7.8.  Today's assessment: S: The patient is resting comfortably. No new complaints. O: Vitals:  Vitals:   07/19/20 0725 07/19/20 1216  BP: 126/73 106/63  Pulse: 73 74  Resp: 17 18  Temp: 97.7 F (36.5 C)   SpO2: 97% 92%   Exam:  Constitutional:   The patient is awake and alert. No acute distress. Respiratory:   No increased work of breathing.  No wheezes, rales, or rhonchi  No tactile fremitus Cardiovascular:   Regular rate and rhythm  No murmurs, ectopy, or gallups.  No lateral PMI. No thrills. Abdomen:   Abdomen is soft, non-tender, non-distended  No hernias, masses, or organomegaly  Normoactive bowel sounds.  Musculoskeletal:   No cyanosis, clubbing, or edema Skin:   No rashes, lesions, ulcers  palpation of skin: no induration or nodules Neurologic:   CN 2-12 intact  Sensation all 4 extremities intact Psychiatric:   Mental status ? Mood, affect appropriate ? Orientation to person, place, time   judgment and insight appear intact    Discharge Instructions  Discharge Instructions    Activity as tolerated - No restrictions   Complete by: As directed    Activity as tolerated - No restrictions   Complete by: As directed    Call MD for:  difficulty breathing, headache or visual disturbances   Complete by: As directed    Call MD for:  extreme fatigue   Complete by: As directed    Call MD for:  persistant dizziness or light-headedness   Complete by: As directed    Call MD for:  persistant nausea and vomiting   Complete by: As directed    Call MD for:  severe uncontrolled pain   Complete by: As directed    Diet - low sodium heart healthy   Complete by: As directed    Discharge instructions   Complete by: As directed    Follow up with PCP in 7-10 days. Follow  up with hematology in one week.  Have CBC checked at that visit. Follow up with GI as directed. Take Iron tablets daily. Please note, they will make your stools dark/black in color.   Increase activity slowly   Complete by: As directed    Increase activity slowly   Complete by: As directed      Allergies as of 07/19/2020      Reactions   Chantix [varenicline] Hives, Shortness Of Breath, Palpitations   Diphenhydramine Hcl Shortness Of Breath   Potassium Itching, Swelling   Benadryl [diphenhydramine Hcl] Rash   Niacin Rash   Trazodone Palpitations      Medication List    STOP taking these medications   acetaminophen 325 MG tablet Commonly known as: TYLENOL     TAKE these medications   albuterol (2.5 MG/3ML) 0.083% nebulizer solution Commonly known as: PROVENTIL Take 3 mLs (2.5 mg total) by nebulization every 6 (six) hours as needed for wheezing or shortness of breath.   alprazolam 2 MG tablet Commonly known as: XANAX Take 2 mg by mouth 2 (two) times  daily.   atorvastatin 80 MG tablet Commonly known as: LIPITOR Take 80 mg by mouth daily.   busPIRone 10 MG tablet Commonly known as: BUSPAR Take 10 mg by mouth 2 (two) times daily.   carvedilol 6.25 MG tablet Commonly known as: COREG Take 6.25 mg by mouth 2 (two) times daily.   cilostazol 100 MG tablet Commonly known as: PLETAL Take 1 tablet (100 mg total) by mouth 2 (two) times daily.   citalopram 40 MG tablet Commonly known as: CELEXA Take 40 mg by mouth daily.   cyanocobalamin 1000 MCG tablet Take 1 tablet (1,000 mcg total) by mouth daily.   DULoxetine 30 MG capsule Commonly known as: CYMBALTA Take 30 mg by mouth daily.   ferrous gluconate 324 MG tablet Commonly known as: FERGON Take 1 tablet (324 mg total) by mouth daily with breakfast.   furosemide 40 MG tablet Commonly known as: LASIX Take 1 tablet (40 mg total) by mouth daily.   gabapentin 300 MG capsule Commonly known as: NEURONTIN Take 300 mg  by mouth 2 (two) times daily.   ipratropium-albuterol 0.5-2.5 (3) MG/3ML Soln Commonly known as: DUONEB Take 3 mLs by nebulization every 6 (six) hours as needed.   isosorbide mononitrate 30 MG 24 hr tablet Commonly known as: IMDUR Take 1 tablet (30 mg total) by mouth daily.   nitroGLYCERIN 0.4 MG SL tablet Commonly known as: NITROSTAT Place 0.4 mg under the tongue as needed.   pantoprazole 40 MG tablet Commonly known as: PROTONIX Take 1 tablet (40 mg total) by mouth 2 (two) times daily before a meal.   Symbicort 80-4.5 MCG/ACT inhaler Generic drug: budesonide-formoterol Inhale 2 puffs into the lungs 2 (two) times daily.   Vitamin D (Ergocalciferol) 1.25 MG (50000 UNIT) Caps capsule Commonly known as: DRISDOL Take 50,000 Units by mouth every Saturday.      Allergies  Allergen Reactions  . Chantix [Varenicline] Hives, Shortness Of Breath and Palpitations  . Diphenhydramine Hcl Shortness Of Breath  . Potassium Itching and Swelling  . Benadryl [Diphenhydramine Hcl] Rash  . Niacin Rash  . Trazodone Palpitations    The results of significant diagnostics from this hospitalization (including imaging, microbiology, ancillary and laboratory) are listed below for reference.    Significant Diagnostic Studies: DG Chest 2 View  Result Date: 07/16/2020 CLINICAL DATA:  Weakness. EXAM: CHEST - 2 VIEW COMPARISON:  None. FINDINGS: The heart size and mediastinal contours are within normal limits. Both lungs are clear. The visualized skeletal structures are unremarkable. There are atherosclerotic changes of the thoracic aorta. IMPRESSION: No active cardiopulmonary disease. Electronically Signed   By: Katherine Mantle M.D.   On: 07/16/2020 16:51    Microbiology: Recent Results (from the past 240 hour(s))  SARS Coronavirus 2 by RT PCR (hospital order, performed in [facility] hospital lab) Nasopharyngeal Nasopharyngeal Swab     Status: None   Collection Time: 07/17/20  2:21 AM   Specimen:  Nasopharyngeal Swab  Result Value Ref Range Status   SARS Coronavirus 2 NEGATIVE NEGATIVE Final    Comment: (NOTE) SARS-CoV-2 target nucleic acids are NOT DETECTED.  The SARS-CoV-2 RNA is generally detectable in upper and lower respiratory specimens during the acute phase of infection. The lowest concentration of SARS-CoV-2 viral copies this assay can detect is 250 copies / mL. A negative result does not preclude SARS-CoV-2 infection and should not be used as the sole basis for treatment or other patient management decisions.  A negative result may occur with improper specimen collection /  handling, submission of specimen other than nasopharyngeal swab, presence of viral mutation(s) within the areas targeted by this assay, and inadequate number of viral copies (<250 copies / mL). A negative result must be combined with clinical observations, patient history, and epidemiological information.  Fact Sheet for Patients:   BoilerBrush.com.cy  Fact Sheet for Healthcare Providers: https://pope.com/  This test is not yet approved or  cleared by the [place] FDA and has been authorized for detection and/or diagnosis of SARS-CoV-2 by FDA under an Emergency Use Authorization (EUA).  This EUA will remain in effect (meaning this test can be used) for the duration of the COVID-19 declaration under Section 564(b)(1) of the Act, 21 U.S.C. section 360bbb-3(b)(1), unless the authorization is terminated or revoked sooner.  Performed at [facility], [address] [place], [place] [address]      Labs: Basic Metabolic Panel: Recent Labs  Lab 07/16/20 1621 07/18/20 0545  NA 140 141  K 3.1* 3.8  CL 107 109  CO2 26 25  GLUCOSE 61* 81  BUN 11 7*  CREATININE 0.88 0.64  CALCIUM 8.7* 8.5*  MG  --  1.9   Liver Function Tests: No results for input(s): AST, ALT, ALKPHOS, BILITOT, PROT, ALBUMIN in the last 168 hours. No results  for input(s): LIPASE, AMYLASE in the last 168 hours. No results for input(s): AMMONIA in the last 168 hours. CBC: Recent Labs  Lab 07/16/20 1621 07/17/20 0646 07/17/20 1649 07/17/20 2231 07/18/20 0545 07/18/20 1104 07/19/20 0627  WBC 7.9  --   --   --   --   --  6.2  HGB 7.2*   < > 7.8* 7.2* 7.5* 7.3* 7.8*  HCT 25.6*   < > 26.8* 24.9* 24.8* 25.3* 26.2*  MCV 74.9*  --   --   --   --   --  75.3*  PLT 280  --   --   --   --   --  226   < > = values in this interval not displayed.   Cardiac Enzymes: No results for input(s): CKTOTAL, CKMB, CKMBINDEX, TROPONINI in the last 168 hours. BNP: BNP (last 3 results) No results for input(s): BNP in the last 8760 hours.  ProBNP (last 3 results) No results for input(s): PROBNP in the last 8760 hours.  CBG: Recent Labs  Lab 07/17/20 0231  GLUCAP 86    Principal Problem:   Symptomatic anemia Active Problems:   Chronic obstructive pulmonary disease (HCC)   Coronary artery disease, non-occlusive   Cerebrovascular disease   Diastolic dysfunction   Occult GI bleeding   History of GI bleed   Time coordinating discharge: 38 minutes.  Signed:        Baylie Drakes, DO [facility]  07/19/2020, 5:44 PM

## 2020-07-23 ENCOUNTER — Encounter (INDEPENDENT_AMBULATORY_CARE_PROVIDER_SITE_OTHER): Payer: Self-pay | Admitting: Vascular Surgery

## 2020-07-26 ENCOUNTER — Ambulatory Visit: Payer: Medicare Other | Admitting: Pulmonary Disease

## 2020-07-26 ENCOUNTER — Encounter: Payer: Self-pay | Admitting: Pulmonary Disease

## 2020-07-26 VITALS — BP 130/72 | HR 83 | Temp 96.9°F | Ht 67.0 in | Wt 138.2 lb

## 2020-07-26 DIAGNOSIS — I272 Pulmonary hypertension, unspecified: Secondary | ICD-10-CM

## 2020-07-26 DIAGNOSIS — R0602 Shortness of breath: Secondary | ICD-10-CM

## 2020-07-26 DIAGNOSIS — J449 Chronic obstructive pulmonary disease, unspecified: Secondary | ICD-10-CM

## 2020-07-26 DIAGNOSIS — I340 Nonrheumatic mitral (valve) insufficiency: Secondary | ICD-10-CM

## 2020-07-26 NOTE — Progress Notes (Signed)
 Assessment & Plan:  1. Shortness of breath (Primary)  2. COPD suggested by initial evaluation  3. Pulmonary hypertension (HCC)  4. Mitral valve insufficiency, unspecified etiology   Patient Instructions  Please call your primary care physician's office tomorrow to make an appointment for follow-up and also to discuss the issues with regards to your pain medications and anemia.   We are going to get an overnight oxygen level to make sure that your oxygen level is staying good when you sleep.   We are going to schedule breathing tests.    See you in follow-up in 3 months time call sooner should any new problems arise.  Please note: late entry documentation due to logistical difficulties during COVID-19 pandemic. This note is filed for information purposes only, and is not intended to be used for billing, nor does it represent the full scope/nature of the visit in question. Please see any associated scanned media linked to date of encounter for additional pertinent information.  Subjective:    HPI: Traci Duran is a 64 y.o. female presenting to the pulmonology clinic on 07/26/2020 with report of: pulmonary consult (per Dr. Niel COPD and asthma. c/o sob with exertion. )     Outpatient Encounter Medications as of 07/26/2020  Medication Sig   albuterol  (PROVENTIL ) (2.5 MG/3ML) 0.083% nebulizer solution Take 3 mLs (2.5 mg total) by nebulization every 6 (six) hours as needed for wheezing or shortness of breath.   alprazolam  (XANAX ) 2 MG tablet Take 2 mg by mouth 2 (two) times daily.    atorvastatin  (LIPITOR ) 80 MG tablet Take 80 mg by mouth daily.   busPIRone  (BUSPAR ) 10 MG tablet Take 10 mg by mouth 2 (two) times daily.    carvedilol  (COREG ) 6.25 MG tablet Take 6.25 mg by mouth 2 (two) times daily.   citalopram  (CELEXA ) 40 MG tablet Take 40 mg by mouth daily.   nitroGLYCERIN  (NITROSTAT ) 0.4 MG SL tablet Place 0.4 mg under the tongue every 5 (five) minutes as  needed for chest pain.   [DISCONTINUED] cilostazol  (PLETAL ) 100 MG tablet Take 1 tablet (100 mg total) by mouth 2 (two) times daily.   [DISCONTINUED] DULoxetine  (CYMBALTA ) 30 MG capsule Take 30 mg by mouth daily. (Patient not taking: Reported on 09/26/2021)   [DISCONTINUED] ferrous gluconate  (FERGON) 324 MG tablet Take 1 tablet (324 mg total) by mouth daily with breakfast. (Patient not taking: Reported on 10/09/2022)   [DISCONTINUED] furosemide  (LASIX ) 40 MG tablet Take 1 tablet (40 mg total) by mouth daily.   [DISCONTINUED] gabapentin  (NEURONTIN ) 300 MG capsule Take 300 mg by mouth 2 (two) times daily.   [DISCONTINUED] ipratropium-albuterol  (DUONEB) 0.5-2.5 (3) MG/3ML SOLN Take 3 mLs by nebulization every 6 (six) hours as needed.   [DISCONTINUED] isosorbide  mononitrate (IMDUR ) 30 MG 24 hr tablet Take 1 tablet (30 mg total) by mouth daily.   [DISCONTINUED] pantoprazole  (PROTONIX ) 40 MG tablet Take 1 tablet (40 mg total) by mouth 2 (two) times daily before a meal. (Patient not taking: Reported on 09/26/2021)   [DISCONTINUED] SYMBICORT 80-4.5 MCG/ACT inhaler Inhale 2 puffs into the lungs 2 (two) times daily.   [DISCONTINUED] vitamin B-12 1000 MCG tablet Take 1 tablet (1,000 mcg total) by mouth daily. (Patient not taking: Reported on 09/26/2021)   [DISCONTINUED] Vitamin D , Ergocalciferol , (DRISDOL ) 50000 units CAPS capsule Take 50,000 Units by mouth every Saturday.  (Patient not taking: Reported on 09/26/2021)   No facility-administered encounter medications on file as of 07/26/2020.  Objective:   Vitals:   07/26/20 1533  BP: 130/72  Pulse: 83  Temp: (!) 96.9 F (36.1 C)  Height: 5' 7 (1.702 m)  Weight: 138 lb 3.2 oz (62.7 kg)  SpO2: 97%  TempSrc: Temporal  BMI (Calculated): 21.64     Physical exam documentation is limited by delayed entry of information.

## 2020-07-26 NOTE — Patient Instructions (Signed)
Please call your primary care physician's office tomorrow to make an appointment for follow-up and also to discuss the issues with regards to your pain medications and anemia.   We are going to get an overnight oxygen level to make sure that your oxygen level is staying good when you sleep.   We are going to schedule breathing tests.    See you in follow-up in 3 months time call sooner should any new problems arise.

## 2020-07-31 ENCOUNTER — Inpatient Hospital Stay: Payer: Medicare Other | Attending: Oncology | Admitting: Oncology

## 2020-07-31 ENCOUNTER — Encounter: Payer: Self-pay | Admitting: Oncology

## 2020-07-31 ENCOUNTER — Inpatient Hospital Stay: Payer: Medicare Other

## 2020-07-31 ENCOUNTER — Other Ambulatory Visit: Payer: Self-pay

## 2020-07-31 VITALS — BP 123/70 | HR 83 | Temp 96.0°F | Resp 16 | Wt 137.9 lb

## 2020-07-31 VITALS — BP 125/66 | HR 75

## 2020-07-31 DIAGNOSIS — D500 Iron deficiency anemia secondary to blood loss (chronic): Secondary | ICD-10-CM

## 2020-07-31 DIAGNOSIS — Z79899 Other long term (current) drug therapy: Secondary | ICD-10-CM | POA: Insufficient documentation

## 2020-07-31 DIAGNOSIS — D509 Iron deficiency anemia, unspecified: Secondary | ICD-10-CM | POA: Insufficient documentation

## 2020-07-31 DIAGNOSIS — E538 Deficiency of other specified B group vitamins: Secondary | ICD-10-CM | POA: Diagnosis not present

## 2020-07-31 MED ORDER — CYANOCOBALAMIN 1000 MCG/ML IJ SOLN
1000.0000 ug | INTRAMUSCULAR | Status: DC
  Administered 2020-07-31: 1000 ug via INTRAMUSCULAR
  Filled 2020-07-31: qty 1

## 2020-07-31 MED ORDER — SODIUM CHLORIDE 0.9 % IV SOLN
510.0000 mg | Freq: Once | INTRAVENOUS | Status: AC
  Administered 2020-07-31: 510 mg via INTRAVENOUS
  Filled 2020-07-31: qty 510

## 2020-07-31 MED ORDER — ACETAMINOPHEN 500 MG PO TABS
500.0000 mg | ORAL_TABLET | Freq: Once | ORAL | Status: AC
  Administered 2020-07-31: 500 mg via ORAL
  Filled 2020-07-31: qty 1

## 2020-07-31 MED ORDER — SODIUM CHLORIDE 0.9 % IV SOLN
Freq: Once | INTRAVENOUS | Status: AC
  Filled 2020-07-31: qty 250

## 2020-08-04 ENCOUNTER — Encounter: Payer: Self-pay | Admitting: Oncology

## 2020-08-04 NOTE — Progress Notes (Signed)
Hematology/Oncology Consult note [facility] Telephone:([phone] [phone]  Patient Care Team: [name] as PCP - General   Name of the patient: Traci Duran  [id number]  04/21/1956    Reason for referral- anemia   Referring physician- [facility]  Date of visit: 08/04/20   History of presenting illness- Patient is a 64 year old female referred for iron deficiency anemia.  Most recent CBC from 07/16/2020 showed white count of 7.9, H&H of 7.2/25.6 with an MCV of 74.9 and a platelet count of 280.  Ferritin levels were low at 19 and iron studies showed low iron saturation of 4%.  B12 was low at 187.  Folate was normal.  She also underwent small bowel endoscopy for obscure GI bleeding which showed no evidence of any pathology in proximal jejunum medium sized hiatal hernia she has previously undergone EGD and colonoscopy in October and November 2020 as well she is here for consideration of IV iron.  Currently patient reports significant fatigue.  Denies any bright red blood in her stool or dark melanotic stools  ECOG PS- 1  Pain scale- 0   Review of systems- Review of Systems  Constitutional: Negative for chills, fever, malaise/fatigue and weight loss.  HENT: Negative for congestion, ear discharge and nosebleeds.   Eyes: Negative for blurred vision.  Respiratory: Negative for cough, hemoptysis, sputum production, shortness of breath and wheezing.   Cardiovascular: Negative for chest pain, palpitations, orthopnea and claudication.  Gastrointestinal: Negative for abdominal pain, blood in stool, constipation, diarrhea, heartburn, melena, nausea and vomiting.  Genitourinary: Negative for dysuria, flank pain, frequency, hematuria and urgency.  Musculoskeletal: Negative for back pain, joint pain and myalgias.  Skin: Negative for rash.  Neurological: Negative for dizziness, tingling, focal weakness, seizures, weakness and headaches.   Endo/Heme/Allergies: Does not bruise/bleed easily.  Psychiatric/Behavioral: Negative for depression and suicidal ideas. The patient does not have insomnia.     Allergies  Allergen Reactions  . Chantix [Varenicline] Hives, Shortness Of Breath and Palpitations  . Diphenhydramine Hcl Shortness Of Breath  . Potassium Itching and Swelling  . Benadryl [Diphenhydramine Hcl] Rash  . Niacin Rash  . Trazodone Palpitations    Patient Active Problem List   Diagnosis Date Noted  . Iron deficiency anemia 07/31/2020  . History of GI bleed 07/17/2020  . Trochanteric fracture of right femur (HCC) 11/13/2019  . Occult GI bleeding   . Symptomatic anemia 10/06/2019  . Acute CHF (congestive heart failure) (HCC) 05/16/2019  . Rectal bleed 11/19/2018  . 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 Benzodiazepine abuse (HCC) 09/03/2018  . Moderate recurrent major depression (HCC) 09/03/2018  . Hyponatremia 10/11/2017  . Chest pain 07/28/2017  . Acute blood loss anemia 07/28/2017  . Demand ischemia (HCC)   . Benign neoplasm of descending colon   . Polyp of sigmoid colon   . Age related osteoporosis 04/16/2017  . Iron deficiency anemia secondary to blood loss (chronic)   . GI bleed 04/06/2017  . Diastolic dysfunction 09/30/2016  . Opioid contract exists 06/27/2016  . Incidental lung nodule 01/18/2016  . Traumatic hemorrhage of cerebrum (HCC) 12/04/2015  . Hypovitaminosis D 08/29/2015  . Thyroid lesion 08/29/2015  . Major depressive disorder, single episode, unspecified 08/27/2015  . 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 Gastroesophageal reflux disease 01/26/2015  . Osteopenia 01/26/2015  . Anemia, unspecified 12/27/2014  . History of fall 12/27/2014  . Greater tuberosity of humerus fracture 10/24/2013  . AVF (arteriovenous fistula) (HCC) 11/08/2011  .  Anxiety 10/24/2011  . Cerebrovascular disease 10/24/2011  . Dyslipidemia 10/24/2011  . Hypertension 10/24/2011  . Tobacco  abuse 10/24/2011  . Palpitations 10/24/2011  . Renal artery stenosis (HCC) 10/24/2011  . Unstable angina (HCC) 10/14/2011  . Hyperlipidemia   . Chronic obstructive pulmonary disease (HCC)   . Depression   . Peripheral arterial disease (HCC)   . Coronary artery disease, non-occlusive      Past Medical History:  Diagnosis Date  . Anemia   . Anxiety   . CHF (congestive heart failure) (HCC)   . COPD (chronic obstructive pulmonary disease) (HCC)   . Coronary artery disease   . Depression   . Gastritis   . GERD (gastroesophageal reflux disease)   . Hemorrhoids   . Hyperlipidemia   . Hypertension   . Myocardial infarction (HCC)   . PAD (peripheral artery disease) (HCC)      Past Surgical History:  Procedure Laterality Date  . APPENDECTOMY    . COLONOSCOPY WITH PROPOFOL N/A 06/09/2017   Procedure: COLONOSCOPY WITH PROPOFOL;  Surgeon: Midge Minium, MD;  Location: [facility] ENDOSCOPY;  Service: Endoscopy;  Laterality: N/A;  . COLONOSCOPY WITH PROPOFOL N/A 10/08/2019   Procedure: COLONOSCOPY WITH PROPOFOL;  Surgeon: Midge Minium, MD;  Location: [facility] ENDOSCOPY;  Service: Endoscopy;  Laterality: N/A;  . ENTEROSCOPY N/A 10/08/2019   Procedure: ENTEROSCOPY;  Surgeon: Midge Minium, MD;  Location: [facility] ENDOSCOPY;  Service: Endoscopy;  Laterality: N/A;  . ENTEROSCOPY N/A 07/18/2020   Procedure: ENTEROSCOPY;  Surgeon: Toney Reil, MD;  Location: [facility] ENDOSCOPY;  Service: Gastroenterology;  Laterality: N/A;  . ESOPHAGOGASTRODUODENOSCOPY N/A 07/30/2017   Procedure: ESOPHAGOGASTRODUODENOSCOPY (EGD);  Surgeon: Toney Reil, MD;  Location: [facility] ENDOSCOPY;  Service: Gastroenterology;  Laterality: N/A;  . ESOPHAGOGASTRODUODENOSCOPY (EGD) WITH PROPOFOL N/A 04/09/2017   Procedure: ESOPHAGOGASTRODUODENOSCOPY (EGD) WITH PROPOFOL;  Surgeon: Midge Minium, MD;  Location: [facility] ENDOSCOPY;  Service: Endoscopy;  Laterality: N/A;  . GIVENS CAPSULE STUDY N/A 10/08/2019   Procedure: GIVENS CAPSULE STUDY;   Surgeon: Midge Minium, MD;  Location: [facility] ENDOSCOPY;  Service: Endoscopy;  Laterality: N/A;  . HIP FRACTURE SURGERY      Social History   Socioeconomic History  . Marital status: Widowed    Spouse name: Not on file  . Number of children: Not on file  . Years of education: Not on file  . Highest education level: Not on file  Occupational History  . Not on file  Tobacco Use  . Smoking status: Current Some Day Smoker    Packs/day: 1.00    Years: 46.00    Pack years: 46.00    Types: Cigarettes  . Smokeless tobacco: Never Used  . Tobacco comment: she feels that the 21 nicotine made her jittery and we suggested a lower level patch  Vaping Use  . Vaping Use: Never used  Substance and Sexual Activity  . Alcohol use: No  . Drug use: Yes    Types: Marijuana    Comment: monthly  . Sexual activity: Not Currently  Other Topics Concern  . Not on file  Social History Narrative  . Not on file   Social Determinants of Health   Financial Resource Strain:   . Difficulty of Paying Living Expenses: Not on file  Food Insecurity:   . Worried About Programme researcher, broadcasting/film/video in the Last Year: Not on file  . Ran Out of Food in the Last Year: Not on file  Transportation Needs:   . Lack of Transportation (Medical): Not on file  .  Lack of Transportation (Non-Medical): Not on file  Physical Activity:   . Days of Exercise per Week: Not on file  . Minutes of Exercise per Session: Not on file  Stress:   . Feeling of Stress : Not on file  Social Connections:   . Frequency of Communication with Friends and Family: Not on file  . Frequency of Social Gatherings with Friends and Family: Not on file  . Attends Religious Services: Not on file  . Active Member of Clubs or Organizations: Not on file  . Attends Banker Meetings: Not on file  . Marital Status: Not on file  Intimate Partner Violence:   . Fear of Current or Ex-Partner: Not on file  . Emotionally Abused: Not on file  .  Physically Abused: Not on file  . Sexually Abused: Not on file     Family History  Problem Relation Age of Onset  . Heart failure Mother   . Heart attack Mother        mother died at 4 of an MI  . Heart attack Father        father died at 75 of an MI  . Heart attack Brother        Brother had an MI in his 66s     Current Outpatient Medications:  .  albuterol (PROVENTIL) (2.5 MG/3ML) 0.083% nebulizer solution, Take 3 mLs (2.5 mg total) by nebulization every 6 (six) hours as needed for wheezing or shortness of breath., Disp: 75 mL, Rfl: 0 .  alprazolam (XANAX) 2 MG tablet, Take 2 mg by mouth 2 (two) times daily. , Disp: , Rfl:  .  atorvastatin (LIPITOR) 80 MG tablet, Take 80 mg by mouth daily., Disp: , Rfl:  .  busPIRone (BUSPAR) 10 MG tablet, Take 10 mg by mouth 2 (two) times daily. , Disp: , Rfl: 2 .  carvedilol (COREG) 6.25 MG tablet, Take 6.25 mg by mouth 2 (two) times daily., Disp: , Rfl:  .  cilostazol (PLETAL) 100 MG tablet, Take 1 tablet (100 mg total) by mouth 2 (two) times daily., Disp: 60 tablet, Rfl: 6 .  citalopram (CELEXA) 40 MG tablet, Take 40 mg by mouth daily., Disp: , Rfl:  .  DULoxetine (CYMBALTA) 30 MG capsule, Take 30 mg by mouth daily., Disp: , Rfl:  .  ferrous gluconate (FERGON) 324 MG tablet, Take 1 tablet (324 mg total) by mouth daily with breakfast., Disp: 30 tablet, Rfl: 0 .  furosemide (LASIX) 40 MG tablet, Take 1 tablet (40 mg total) by mouth daily., Disp: 30 tablet, Rfl: 0 .  gabapentin (NEURONTIN) 300 MG capsule, Take 300 mg by mouth 2 (two) times daily. , Disp: , Rfl:  .  ipratropium-albuterol (DUONEB) 0.5-2.5 (3) MG/3ML SOLN, Take 3 mLs by nebulization every 6 (six) hours as needed., Disp: 360 mL, Rfl: 0 .  isosorbide mononitrate (IMDUR) 30 MG 24 hr tablet, Take 1 tablet (30 mg total) by mouth daily., Disp: 30 tablet, Rfl: 6 .  nitroGLYCERIN (NITROSTAT) 0.4 MG SL tablet, Place 0.4 mg under the tongue as needed., Disp: , Rfl:  .  pantoprazole (PROTONIX)  40 MG tablet, Take 1 tablet (40 mg total) by mouth 2 (two) times daily before a meal., Disp: 60 tablet, Rfl: 9 .  SYMBICORT 80-4.5 MCG/ACT inhaler, Inhale 2 puffs into the lungs 2 (two) times daily., Disp: , Rfl:  .  vitamin B-12 1000 MCG tablet, Take 1 tablet (1,000 mcg total) by mouth daily., Disp: 30 tablet,  Rfl: 0 .  Vitamin D, Ergocalciferol, (DRISDOL) 50000 units CAPS capsule, Take 50,000 Units by mouth every Saturday. , Disp: , Rfl: 3   Physical exam:  Vitals:   07/31/20 1348  BP: 123/70  Pulse: 83  Resp: 16  Temp: (!) 96 F (35.6 C)  TempSrc: Tympanic  SpO2: 96%  Weight: 137 lb 14.4 oz (62.6 kg)   Physical Exam Constitutional:      General: She is not in acute distress. Cardiovascular:     Rate and Rhythm: Normal rate and regular rhythm.     Heart sounds: Normal heart sounds.  Pulmonary:     Effort: Pulmonary effort is normal.     Breath sounds: Normal breath sounds.  Abdominal:     General: Bowel sounds are normal.     Palpations: Abdomen is soft.  Skin:    General: Skin is warm and dry.  Neurological:     Mental Status: She is alert and oriented to person, place, and time.        CMP Latest Ref Rng & Units 07/18/2020  Glucose 70 - 99 mg/dL 81  BUN 8 - 23 mg/dL 7(L)  Creatinine 8.65 - 1.00 mg/dL 7.84  Sodium 696 - 295 mmol/L 141  Potassium 3.5 - 5.1 mmol/L 3.8  Chloride 98 - 111 mmol/L 109  CO2 22 - 32 mmol/L 25  Calcium 8.9 - 10.3 mg/dL 2.8(U)  Total Protein 6.5 - 8.1 g/dL -  Total Bilirubin 0.3 - 1.2 mg/dL -  Alkaline Phos 38 - 132 U/L -  AST 15 - 41 U/L -  ALT 0 - 44 U/L -   CBC Latest Ref Rng & Units 07/19/2020  WBC 4.0 - 10.5 K/uL 6.2  Hemoglobin 12.0 - 15.0 g/dL 7.8(L)  Hematocrit 36 - 46 % 26.2(L)  Platelets 150 - 400 K/uL 226    No images are attached to the encounter.  DG Chest 2 View  Result Date: 07/16/2020 CLINICAL DATA:  Weakness. EXAM: CHEST - 2 VIEW COMPARISON:  None. FINDINGS: The heart size and mediastinal contours are within  normal limits. Both lungs are clear. The visualized skeletal structures are unremarkable. There are atherosclerotic changes of the thoracic aorta. IMPRESSION: No active cardiopulmonary disease. Electronically Signed   By: Katherine Mantle M.D.   On: 07/16/2020 16:51    Assessment and plan- Patient is a 64 y.o. female referred for iron deficiency anemia possibly secondary to hiatal hernia versus obscure GI bleeding for consideration of IV iron  Recent labs are consistent with severe iron deficiency anemia.  She would therefore benefit from IV iron.  Based on her insurance we will decide if she can get Feraheme versus Venofer.  Discussed risks and benefits of IV iron including all but not limited to nausea, headache, leg swelling and possible risk of infusion reaction.  If she gets Feraheme she will need 2 doses 510 mg IV versus Venofer 200 mg IV x5 doses.  Patient understands and agrees to proceed as planned.  She is also low in her B12 and will continue to receive monthly B12 injections I will see her back in 2 months time with CBC ferritin and iron studies as well as B12   Thank you for this kind referral and the opportunity to participate in the care of this patient   Visit Diagnosis 1. B12 deficiency   2. Iron deficiency anemia due to chronic blood loss     Dr. Owens Shark, MD, MPH [facility]  [phone] 08/04/2020 6:19 PM

## 2020-08-07 ENCOUNTER — Inpatient Hospital Stay: Payer: Medicare Other

## 2020-08-09 ENCOUNTER — Encounter (INDEPENDENT_AMBULATORY_CARE_PROVIDER_SITE_OTHER): Payer: Self-pay | Admitting: Vascular Surgery

## 2020-08-09 ENCOUNTER — Other Ambulatory Visit: Payer: Self-pay

## 2020-08-09 ENCOUNTER — Ambulatory Visit (INDEPENDENT_AMBULATORY_CARE_PROVIDER_SITE_OTHER): Payer: Medicare Other | Admitting: Vascular Surgery

## 2020-08-09 VITALS — BP 120/70 | HR 66 | Resp 16 | Ht 67.0 in | Wt 140.0 lb

## 2020-08-09 DIAGNOSIS — E785 Hyperlipidemia, unspecified: Secondary | ICD-10-CM

## 2020-08-09 DIAGNOSIS — I2510 Atherosclerotic heart disease of native coronary artery without angina pectoris: Secondary | ICD-10-CM

## 2020-08-09 DIAGNOSIS — I701 Atherosclerosis of renal artery: Secondary | ICD-10-CM | POA: Diagnosis not present

## 2020-08-09 DIAGNOSIS — I6523 Occlusion and stenosis of bilateral carotid arteries: Secondary | ICD-10-CM

## 2020-08-09 DIAGNOSIS — I739 Peripheral vascular disease, unspecified: Secondary | ICD-10-CM

## 2020-08-09 NOTE — Progress Notes (Signed)
MRN : [id number]  Traci Duran is a 64 y.o. (05-Aug-1956) female who presents with chief complaint of No chief complaint on file. Marland Kitchen  History of Present Illness:    The patient is seen for evaluation of painful lower extremities and diminished pulses. Patient notes the pain is always associated with activity and is very consistent day today. Typically, the pain occurs at less than one block, progress is as activity continues to the point that the patient must stop walking. Resting including standing still for several minutes allowed resumption of the activity and the ability to walk a similar distance before stopping again. Uneven terrain and inclined shorten the distance. The pain has been progressive over the past several years. The patient states the inability to walk is now having a profound negative impact on quality of life and daily activities.  The patient denies rest pain or dangling of an extremity off the side of the bed during the night for relief. No open wounds or sores at this time. No prior interventions or surgeries.  There is history of back problems or DJD of the lumbar sacral spine.   The patient denies changes in claudication symptoms or new rest pain symptoms.  No new ulcers or wounds of the foot.  The patient's blood pressure has been stable and relatively well controlled. The patient denies amaurosis fugax or recent TIA symptoms. There are no recent neurological changes noted. The patient denies history of DVT, PE or superficial thrombophlebitis. The patient denies recent episodes of angina or shortness of breath.   No outpatient medications have been marked as taking for the 08/09/20 encounter (Appointment) with Gilda Crease, Latina Craver, MD.    Past Medical History:  Diagnosis Date  . Anemia   . Anxiety   . CHF (congestive heart failure) (HCC)   . COPD (chronic obstructive pulmonary disease) (HCC)   . Coronary artery disease   . Depression   . Gastritis   .  GERD (gastroesophageal reflux disease)   . Hemorrhoids   . Hyperlipidemia   . Hypertension   . Myocardial infarction (HCC)   . PAD (peripheral artery disease) (HCC)     Past Surgical History:  Procedure Laterality Date  . APPENDECTOMY    . COLONOSCOPY WITH PROPOFOL N/A 06/09/2017   Procedure: COLONOSCOPY WITH PROPOFOL;  Surgeon: Midge Minium, MD;  Location: [facility] ENDOSCOPY;  Service: Endoscopy;  Laterality: N/A;  . COLONOSCOPY WITH PROPOFOL N/A 10/08/2019   Procedure: COLONOSCOPY WITH PROPOFOL;  Surgeon: Midge Minium, MD;  Location: [facility] ENDOSCOPY;  Service: Endoscopy;  Laterality: N/A;  . ENTEROSCOPY N/A 10/08/2019   Procedure: ENTEROSCOPY;  Surgeon: Midge Minium, MD;  Location: [facility] ENDOSCOPY;  Service: Endoscopy;  Laterality: N/A;  . ENTEROSCOPY N/A 07/18/2020   Procedure: ENTEROSCOPY;  Surgeon: Toney Reil, MD;  Location: [facility] ENDOSCOPY;  Service: Gastroenterology;  Laterality: N/A;  . ESOPHAGOGASTRODUODENOSCOPY N/A 07/30/2017   Procedure: ESOPHAGOGASTRODUODENOSCOPY (EGD);  Surgeon: Toney Reil, MD;  Location: [facility] ENDOSCOPY;  Service: Gastroenterology;  Laterality: N/A;  . ESOPHAGOGASTRODUODENOSCOPY (EGD) WITH PROPOFOL N/A 04/09/2017   Procedure: ESOPHAGOGASTRODUODENOSCOPY (EGD) WITH PROPOFOL;  Surgeon: Midge Minium, MD;  Location: [facility] ENDOSCOPY;  Service: Endoscopy;  Laterality: N/A;  . GIVENS CAPSULE STUDY N/A 10/08/2019   Procedure: GIVENS CAPSULE STUDY;  Surgeon: Midge Minium, MD;  Location: [facility] ENDOSCOPY;  Service: Endoscopy;  Laterality: N/A;  . HIP FRACTURE SURGERY      Social History Social History   Tobacco Use  . Smoking status: Current Some Day Smoker  Packs/day: 1.00    Years: 46.00    Pack years: 46.00    Types: Cigarettes  . Smokeless tobacco: Never Used  . Tobacco comment: she feels that the 21 nicotine made her jittery and we suggested a lower level patch  Vaping Use  . Vaping Use: Never used  Substance Use Topics  . Alcohol use: No  . Drug  use: Yes    Types: Marijuana    Comment: monthly    Family History Family History  Problem Relation Age of Onset  . Heart failure Mother   . Heart attack Mother        mother died at 84 of an MI  . Heart attack Father        father died at 23 of an MI  . Heart attack Brother        Brother had an MI in his 69s  No family history of bleeding/clotting disorders, porphyria or autoimmune disease   Allergies  Allergen Reactions  . Chantix [Varenicline] Hives, Shortness Of Breath and Palpitations  . Diphenhydramine Hcl Shortness Of Breath  . Potassium Itching and Swelling  . Benadryl [Diphenhydramine Hcl] Rash  . Niacin Rash  . Trazodone Palpitations     REVIEW OF SYSTEMS (Negative unless checked)  Constitutional: [] Weight loss  [] Fever  [] Chills Cardiac: [] Chest pain   [] Chest pressure   [] Palpitations   [] Shortness of breath when laying flat   [] Shortness of breath with exertion. Vascular:  [x] Pain in legs with walking   [x] Pain in legs at rest  [] History of DVT   [] Phlebitis   [] Swelling in legs   [] Varicose veins   [] Non-healing ulcers Pulmonary:   [] Uses home oxygen   [] Productive cough   [] Hemoptysis   [] Wheeze  [] COPD   [] Asthma Neurologic:  [] Dizziness   [] Seizures   [] History of stroke   [] History of TIA  [] Aphasia   [] Vissual changes   [] Weakness or numbness in arm   [] Weakness or numbness in leg Musculoskeletal:   [] Joint swelling   [] Joint pain   [] Low back pain Hematologic:  [] Easy bruising  [] Easy bleeding   [] Hypercoagulable state   [] Anemic Gastrointestinal:  [] Diarrhea   [] Vomiting  [] Gastroesophageal reflux/heartburn   [] Difficulty swallowing. Genitourinary:  [] Chronic kidney disease   [] Difficult urination  [] Frequent urination   [] Blood in urine Skin:  [] Rashes   [] Ulcers  Psychological:  [] History of anxiety   []  History of major depression.  Physical Examination  There were no vitals filed for this visit. There is no height or weight on file to calculate  BMI. Gen: WD/WN, NAD Head: [place]/AT, No temporalis wasting.  Ear/Nose/Throat: Hearing grossly intact, nares w/o erythema or drainage, poor dentition Eyes: PER, EOMI, sclera nonicteric.  Neck: Supple, no masses.  No bruit or JVD.  Pulmonary:  Good air movement, clear to auscultation bilaterally, no use of accessory muscles.  Cardiac: RRR, normal S1, S2, no Murmurs. Vascular: bilateral carotid bruits Vessel Right Left  Radial Palpable Palpable  Carotid Palpable Palpable  PT Palpable Palpable  DP Palpable Palpable  Gastrointestinal: soft, non-distended. No guarding/no peritoneal signs.  Musculoskeletal: M/S 5/5 throughout.  No deformity or atrophy.  Neurologic: CN 2-12 intact. Pain and light touch intact in extremities.  Symmetrical.  Speech is fluent. Motor exam as listed above. Psychiatric: Judgment intact, Mood & affect appropriate for pt's clinical situation.   CBC Lab Results  Component Value Date   WBC 6.2 07/19/2020   HGB 7.8 (L) 07/19/2020   HCT 26.2 (  L) 07/19/2020   MCV 75.3 (L) 07/19/2020   PLT 226 07/19/2020    BMET    Component Value Date/Time   NA 141 07/18/2020 0545   K 3.8 07/18/2020 0545   CL 109 07/18/2020 0545   CO2 25 07/18/2020 0545   GLUCOSE 81 07/18/2020 0545   BUN 7 (L) 07/18/2020 0545   CREATININE 0.64 07/18/2020 0545   CALCIUM 8.5 (L) 07/18/2020 0545   GFRNONAA >60 07/18/2020 0545   GFRAA >60 07/18/2020 0545   CrCl cannot be calculated (Patient's most recent lab result is older than the maximum 21 days allowed.).  COAG Lab Results  Component Value Date   INR 1.05 11/19/2018   INR 1.09 07/28/2017   INR 1.07 04/06/2017    Radiology DG Chest 2 View  Result Date: 07/16/2020 CLINICAL DATA:  Weakness. EXAM: CHEST - 2 VIEW COMPARISON:  None. FINDINGS: The heart size and mediastinal contours are within normal limits. Both lungs are clear. The visualized skeletal structures are unremarkable. There are atherosclerotic changes of the thoracic aorta.  IMPRESSION: No active cardiopulmonary disease. Electronically Signed   By: Katherine Mantle M.D.   On: 07/16/2020 16:51      Assessment/Plan 1. Peripheral arterial disease (HCC) Recommend:  Patient should undergo arterial duplex of the lower extremity   because there has been a significant deterioration in the patient's lower extremity symptoms.  The patient states they are having increased pain and a marked decrease in the distance that they can walk.  The risks and benefits as well as the alternatives were discussed in detail with the patient.  All questions were answered.  Patient agrees to proceed and understands this could be a prelude to angiography and intervention.  The patient will follow up with me in the office to review the studies.   - VAS [place] ABI WITH/WO TBI; Future - VAS US AORTA/IVC/ILIACS; Future  2. Bilateral carotid artery stenosis Recommend:  Given the patient's asymptomatic subcritical stenosis no further invasive testing or surgery at this time.  Duplex ultrasound will be obtianed.  Continue antiplatelet therapy as prescribed Continue management of CAD, HTN and Hyperlipidemia Healthy heart diet,  encouraged exercise at least 4 times per week Follow up in 1 months with duplex ultrasound and physical exam   - VAS US CAROTID; Future  3. Renal artery stenosis (HCC) Given patient's arterial disease optimal control of the patient's hypertension is important. BP is acceptable today  The patient's vital signs and noninvasive studies support the renal artery stenosis is not significantly increased when compared to the previous study.  No invasive studies or intervention is indicated at this time.  The patient will continue the current antihypertensive medications, no changes at this time.  The primary medical service will continue aggressive antihypertensive therapy as per the AHA guidelines   4. Coronary artery disease, non-occlusive Continue cardiac and  antihypertensive medications as already ordered and reviewed, no changes at this time.  Continue statin as ordered and reviewed, no changes at this time  Nitrates PRN for chest pain   5. Hyperlipidemia, unspecified hyperlipidemia type Continue statin as ordered and reviewed, no changes at this time      Levora Dredge, MD  08/09/2020 11:05 AM

## 2020-08-10 ENCOUNTER — Other Ambulatory Visit: Payer: Self-pay | Admitting: Oncology

## 2020-08-10 ENCOUNTER — Inpatient Hospital Stay: Payer: Medicare Other | Attending: Oncology

## 2020-08-10 VITALS — BP 178/81 | HR 77 | Temp 97.1°F | Resp 16

## 2020-08-10 DIAGNOSIS — D500 Iron deficiency anemia secondary to blood loss (chronic): Secondary | ICD-10-CM

## 2020-08-10 DIAGNOSIS — D509 Iron deficiency anemia, unspecified: Secondary | ICD-10-CM | POA: Insufficient documentation

## 2020-08-10 MED ORDER — SODIUM CHLORIDE 0.9 % IV SOLN
510.0000 mg | Freq: Once | INTRAVENOUS | Status: DC
  Filled 2020-08-10: qty 17

## 2020-08-10 MED ORDER — SODIUM CHLORIDE 0.9 % IV SOLN
Freq: Once | INTRAVENOUS | Status: DC
  Filled 2020-08-10: qty 250

## 2020-08-10 NOTE — Progress Notes (Signed)
Patient here for her second dose of feraheme today. She reports that after her first dose, she left here and went to the grocery store and had an episode of feeling faint, sweaty and had abdominal pain with diarrhea. Informed Dr. Janese Banks of patient's complaints. Per Dr. Janese Banks we can switch her to 300 mg venofer over 90 minutes. Patient agreed to this, but then she stated she had a terrible headache today and her BP was elevated. Patient was rescheduled to get the venofer next week. No IV started, no medication given today.

## 2020-08-14 ENCOUNTER — Telehealth: Payer: Self-pay | Admitting: Oncology

## 2020-08-14 ENCOUNTER — Inpatient Hospital Stay: Payer: Medicare Other

## 2020-08-14 NOTE — Telephone Encounter (Signed)
Made in error

## 2020-08-14 NOTE — Telephone Encounter (Signed)
Patient was scheduled for iron on 08-10-20, but was not able to complete this due to not feeling well. Writer attempted to schedule patient for iron on 08-14-20, but patient stated that she had transportation issues and would not be able to come on 08-14-20. Patient stated that she did not feel well and needed to leave prior to writer being able to finalize appt. Writer was able to schedule Lucianne Lei transportation and infusion for 08-14-20 and attempted to phone patient several times on 08-10-20 and 08-14-20 to inform of appts, but could not reach patient or leave a message. Appts for 08-14-20 have been cancelled due to writer not being able to reach patient to inform of appts. Appts will be rescheduled once writer is able to reach patient.

## 2020-08-15 ENCOUNTER — Encounter (INDEPENDENT_AMBULATORY_CARE_PROVIDER_SITE_OTHER): Payer: Self-pay | Admitting: Vascular Surgery

## 2020-08-16 ENCOUNTER — Telehealth: Payer: Self-pay

## 2020-08-16 NOTE — Telephone Encounter (Signed)
Patient called and left a voicemail stating she needed to make a hospital follow up appointment with Dr. Marius Ditch. Patient states she has a new phone number which I added to chart. Called patient back and left a message for call back

## 2020-08-21 ENCOUNTER — Inpatient Hospital Stay: Payer: Medicare Other

## 2020-08-21 ENCOUNTER — Other Ambulatory Visit: Payer: Self-pay

## 2020-08-21 VITALS — BP 164/75 | HR 68 | Temp 96.6°F | Resp 18

## 2020-08-21 DIAGNOSIS — D500 Iron deficiency anemia secondary to blood loss (chronic): Secondary | ICD-10-CM

## 2020-08-21 DIAGNOSIS — D509 Iron deficiency anemia, unspecified: Secondary | ICD-10-CM | POA: Diagnosis present

## 2020-08-21 MED ORDER — SODIUM CHLORIDE 0.9 % IV SOLN
300.0000 mg | Freq: Once | INTRAVENOUS | Status: AC
  Administered 2020-08-21: 300 mg via INTRAVENOUS
  Filled 2020-08-21: qty 15

## 2020-08-21 MED ORDER — SODIUM CHLORIDE 0.9 % IV SOLN
Freq: Once | INTRAVENOUS | Status: AC
  Filled 2020-08-21: qty 250

## 2020-08-31 ENCOUNTER — Inpatient Hospital Stay: Payer: Medicare Other

## 2020-09-05 ENCOUNTER — Telehealth: Payer: Self-pay

## 2020-09-05 NOTE — Telephone Encounter (Signed)
ONO reviewed by Dr. Patsey Berthold- no need for oxygen.  Patient is aware results and voiced her understanding.  Nothing further needed.

## 2020-09-13 ENCOUNTER — Encounter (INDEPENDENT_AMBULATORY_CARE_PROVIDER_SITE_OTHER): Payer: Medicare Other

## 2020-09-13 ENCOUNTER — Ambulatory Visit (INDEPENDENT_AMBULATORY_CARE_PROVIDER_SITE_OTHER): Payer: Medicare Other | Admitting: Vascular Surgery

## 2020-09-13 ENCOUNTER — Encounter (INDEPENDENT_AMBULATORY_CARE_PROVIDER_SITE_OTHER): Payer: Self-pay

## 2020-10-02 ENCOUNTER — Inpatient Hospital Stay: Payer: Medicare Other

## 2020-10-02 ENCOUNTER — Inpatient Hospital Stay: Payer: Medicare Other | Admitting: Oncology

## 2020-10-09 ENCOUNTER — Inpatient Hospital Stay: Payer: Medicare Other

## 2020-10-09 ENCOUNTER — Encounter: Payer: Self-pay | Admitting: Oncology

## 2020-10-09 ENCOUNTER — Other Ambulatory Visit: Payer: Self-pay

## 2020-10-09 ENCOUNTER — Inpatient Hospital Stay: Payer: Medicare Other | Attending: Oncology

## 2020-10-09 ENCOUNTER — Inpatient Hospital Stay (HOSPITAL_BASED_OUTPATIENT_CLINIC_OR_DEPARTMENT_OTHER): Payer: Medicare Other | Admitting: Oncology

## 2020-10-09 VITALS — BP 186/88 | HR 71 | Temp 97.5°F | Resp 18 | Wt 142.0 lb

## 2020-10-09 DIAGNOSIS — E538 Deficiency of other specified B group vitamins: Secondary | ICD-10-CM | POA: Insufficient documentation

## 2020-10-09 DIAGNOSIS — D500 Iron deficiency anemia secondary to blood loss (chronic): Secondary | ICD-10-CM

## 2020-10-09 LAB — IRON AND TIBC
Iron: 50 ug/dL (ref 28–170)
Saturation Ratios: 19 % (ref 10.4–31.8)
TIBC: 269 ug/dL (ref 250–450)
UIBC: 219 ug/dL

## 2020-10-09 LAB — CBC WITH DIFFERENTIAL/PLATELET
Abs Immature Granulocytes: 0.01 10*3/uL (ref 0.00–0.07)
Basophils Absolute: 0.1 10*3/uL (ref 0.0–0.1)
Basophils Relative: 1 %
Eosinophils Absolute: 0.2 10*3/uL (ref 0.0–0.5)
Eosinophils Relative: 3 %
HCT: 37.2 % (ref 36.0–46.0)
Hemoglobin: 12.6 g/dL (ref 12.0–15.0)
Immature Granulocytes: 0 %
Lymphocytes Relative: 27 %
Lymphs Abs: 2 10*3/uL (ref 0.7–4.0)
MCH: 30.1 pg (ref 26.0–34.0)
MCHC: 33.9 g/dL (ref 30.0–36.0)
MCV: 89 fL (ref 80.0–100.0)
Monocytes Absolute: 0.5 10*3/uL (ref 0.1–1.0)
Monocytes Relative: 7 %
Neutro Abs: 4.8 10*3/uL (ref 1.7–7.7)
Neutrophils Relative %: 62 %
Platelets: 230 10*3/uL (ref 150–400)
RBC: 4.18 MIL/uL (ref 3.87–5.11)
RDW: 16.9 % — ABNORMAL HIGH (ref 11.5–15.5)
WBC: 7.7 10*3/uL (ref 4.0–10.5)
nRBC: 0 % (ref 0.0–0.2)

## 2020-10-09 LAB — FERRITIN: Ferritin: 47 ng/mL (ref 11–307)

## 2020-10-09 MED ORDER — CYANOCOBALAMIN 1000 MCG/ML IJ SOLN
1000.0000 ug | INTRAMUSCULAR | Status: AC
  Administered 2020-10-09: 1000 ug via INTRAMUSCULAR
  Filled 2020-10-09: qty 1

## 2020-10-09 NOTE — Progress Notes (Signed)
Patient reports she has frequent pain in B/L legs and back and would like pain medication. She also reports she's has urinary frequency and bladder pressure x2 weeks- denies dysuria.

## 2020-10-14 NOTE — Progress Notes (Deleted)
I connected with Traci Duran on 10/14/20 at  2:45 PM EDT by video enabled telemedicine visit and verified that I am speaking with the correct person using two identifiers.   I discussed the limitations, risks, security and privacy concerns of performing an evaluation and management service by telemedicine and the availability of in-person appointments. I also discussed with the patient that there may be a patient responsible charge related to this service. The patient expressed understanding and agreed to proceed.  Other persons participating in the visit and their role in the encounter:  none  Patient's location:  home Provider's location:  work  Risk analyst Complaint:  Routine f/u of anemia  History of present illness: is a 64 year old female referred for iron deficiency anemia.  Most recent CBC from 07/16/2020 showed white count of 7.9, H&H of 7.2/25.6 with an MCV of 74.9 and a platelet count of 280.  Ferritin levels were low at 19 and iron studies showed low iron saturation of 4%.  B12 was low at 187.  Folate was normal.  She also underwent small bowel endoscopy for obscure GI bleeding which showed no evidence of any pathology in proximal jejunum medium sized hiatal hernia she has previously undergone EGD and colonoscopy in October and November 2020 as well she is here for consideration of IV iron.   Interval history: She feels well and denies any complaints at this time.  Denies any blood loss in her stool or urine.  Denies any dark melanotic stools.   Review of Systems  Constitutional: Negative for chills, fever, malaise/fatigue and weight loss.  HENT: Negative for congestion, ear discharge and nosebleeds.   Eyes: Negative for blurred vision.  Respiratory: Negative for cough, hemoptysis, sputum production, shortness of breath and wheezing.   Cardiovascular: Negative for chest pain, palpitations, orthopnea and claudication.  Gastrointestinal: Negative for abdominal pain, blood in stool,  constipation, diarrhea, heartburn, melena, nausea and vomiting.  Genitourinary: Negative for dysuria, flank pain, frequency, hematuria and urgency.  Musculoskeletal: Negative for back pain, joint pain and myalgias.  Skin: Negative for rash.  Neurological: Negative for dizziness, tingling, focal weakness, seizures, weakness and headaches.  Endo/Heme/Allergies: Does not bruise/bleed easily.  Psychiatric/Behavioral: Negative for depression and suicidal ideas. The patient does not have insomnia.     Allergies  Allergen Reactions  . Chantix [Varenicline] Hives, Shortness Of Breath and Palpitations  . Diphenhydramine Hcl Shortness Of Breath  . Potassium Itching and Swelling  . Benadryl [Diphenhydramine Hcl] Rash  . Niacin Rash  . Trazodone Palpitations    Past Medical History:  Diagnosis Date  . Anemia   . Anxiety   . CHF (congestive heart failure) ([place])   . COPD (chronic obstructive pulmonary disease) ([place])   . Coronary artery disease   . Depression   . Gastritis   . GERD (gastroesophageal reflux disease)   . Hemorrhoids   . Hyperlipidemia   . Hypertension   . Myocardial infarction ([place])   . PAD (peripheral artery disease) ([place])     Past Surgical History:  Procedure Laterality Date  . APPENDECTOMY    . COLONOSCOPY WITH PROPOFOL N/A 06/09/2017   Procedure: COLONOSCOPY WITH PROPOFOL;  Surgeon: Lucilla Lame, MD;  Location: [facility] ENDOSCOPY;  Service: Endoscopy;  Laterality: N/A;  . COLONOSCOPY WITH PROPOFOL N/A 10/08/2019   Procedure: COLONOSCOPY WITH PROPOFOL;  Surgeon: Lucilla Lame, MD;  Location: [facility] ENDOSCOPY;  Service: Endoscopy;  Laterality: N/A;  . ENTEROSCOPY N/A 10/08/2019   Procedure: ENTEROSCOPY;  Surgeon: Lucilla Lame, MD;  Location: [facility]  ENDOSCOPY;  Service: Endoscopy;  Laterality: N/A;  . ENTEROSCOPY N/A 07/18/2020   Procedure: ENTEROSCOPY;  Surgeon: Lin Landsman, MD;  Location: [facility] ENDOSCOPY;  Service: Gastroenterology;  Laterality: N/A;  .  ESOPHAGOGASTRODUODENOSCOPY N/A 07/30/2017   Procedure: ESOPHAGOGASTRODUODENOSCOPY (EGD);  Surgeon: Lin Landsman, MD;  Location: [facility] ENDOSCOPY;  Service: Gastroenterology;  Laterality: N/A;  . ESOPHAGOGASTRODUODENOSCOPY (EGD) WITH PROPOFOL N/A 04/09/2017   Procedure: ESOPHAGOGASTRODUODENOSCOPY (EGD) WITH PROPOFOL;  Surgeon: Lucilla Lame, MD;  Location: [facility] ENDOSCOPY;  Service: Endoscopy;  Laterality: N/A;  . GIVENS CAPSULE STUDY N/A 10/08/2019   Procedure: GIVENS CAPSULE STUDY;  Surgeon: Lucilla Lame, MD;  Location: [facility] ENDOSCOPY;  Service: Endoscopy;  Laterality: N/A;  . HIP FRACTURE SURGERY      Social History   Socioeconomic History  . Marital status: Widowed    Spouse name: Not on file  . Number of children: Not on file  . Years of education: Not on file  . Highest education level: Not on file  Occupational History  . Not on file  Tobacco Use  . Smoking status: Current Some Day Smoker    Packs/day: 1.00    Years: 46.00    Pack years: 46.00    Types: Cigarettes  . Smokeless tobacco: Never Used  . Tobacco comment: she feels that the 21 nicotine made her jittery and we suggested a lower level patch  Vaping Use  . Vaping Use: Never used  Substance and Sexual Activity  . Alcohol use: No  . Drug use: Yes    Types: Marijuana    Comment: monthly  . Sexual activity: Not Currently  Other Topics Concern  . Not on file  Social History Narrative  . Not on file   Social Determinants of Health   Financial Resource Strain:   . Difficulty of Paying Living Expenses: Not on file  Food Insecurity:   . Worried About Charity fundraiser in the Last Year: Not on file  . Ran Out of Food in the Last Year: Not on file  Transportation Needs:   . Lack of Transportation (Medical): Not on file  . Lack of Transportation (Non-Medical): Not on file  Physical Activity:   . Days of Exercise per Week: Not on file  . Minutes of Exercise per Session: Not on file  Stress:   . Feeling of  Stress : Not on file  Social Connections:   . Frequency of Communication with Friends and Family: Not on file  . Frequency of Social Gatherings with Friends and Family: Not on file  . Attends Religious Services: Not on file  . Active Member of Clubs or Organizations: Not on file  . Attends Archivist Meetings: Not on file  . Marital Status: Not on file  Intimate Partner Violence:   . Fear of Current or Ex-Partner: Not on file  . Emotionally Abused: Not on file  . Physically Abused: Not on file  . Sexually Abused: Not on file    Family History  Problem Relation Age of Onset  . Heart failure Mother   . Heart attack Mother        mother died at 33 of an MI  . Heart attack Father        father died at 58 of an MI  . Heart attack Brother        Brother had an MI in his 49s     Current Outpatient Medications:  .  albuterol (PROVENTIL) (2.5 MG/3ML) 0.083% nebulizer solution, Take  3 mLs (2.5 mg total) by nebulization every 6 (six) hours as needed for wheezing or shortness of breath., Disp: 75 mL, Rfl: 0 .  alprazolam (XANAX) 2 MG tablet, Take 2 mg by mouth 2 (two) times daily. , Disp: , Rfl:  .  busPIRone (BUSPAR) 10 MG tablet, Take 10 mg by mouth 2 (two) times daily. , Disp: , Rfl: 2 .  carvedilol (COREG) 6.25 MG tablet, Take 6.25 mg by mouth 2 (two) times daily., Disp: , Rfl:  .  cilostazol (PLETAL) 100 MG tablet, Take 1 tablet (100 mg total) by mouth 2 (two) times daily., Disp: 60 tablet, Rfl: 6 .  citalopram (CELEXA) 40 MG tablet, Take 40 mg by mouth daily., Disp: , Rfl:  .  DULoxetine (CYMBALTA) 30 MG capsule, Take 30 mg by mouth daily., Disp: , Rfl:  .  ferrous gluconate (FERGON) 324 MG tablet, Take 1 tablet (324 mg total) by mouth daily with breakfast., Disp: 30 tablet, Rfl: 0 .  furosemide (LASIX) 40 MG tablet, Take 1 tablet (40 mg total) by mouth daily., Disp: 30 tablet, Rfl: 0 .  gabapentin (NEURONTIN) 300 MG capsule, Take 300 mg by mouth 2 (two) times daily. , Disp:  , Rfl:  .  ipratropium-albuterol (DUONEB) 0.5-2.5 (3) MG/3ML SOLN, Take 3 mLs by nebulization every 6 (six) hours as needed., Disp: 360 mL, Rfl: 0 .  SYMBICORT 80-4.5 MCG/ACT inhaler, Inhale 2 puffs into the lungs 2 (two) times daily., Disp: , Rfl:  .  atorvastatin (LIPITOR) 80 MG tablet, Take 80 mg by mouth daily. (Patient not taking: Reported on 10/09/2020), Disp: , Rfl:  .  carvedilol (COREG) 6.25 MG tablet, Take by mouth., Disp: , Rfl:  .  isosorbide mononitrate (IMDUR) 30 MG 24 hr tablet, Take 1 tablet (30 mg total) by mouth daily. (Patient not taking: Reported on 10/09/2020), Disp: 30 tablet, Rfl: 6 .  nitroGLYCERIN (NITROSTAT) 0.4 MG SL tablet, Place 0.4 mg under the tongue as needed., Disp: , Rfl:  .  pantoprazole (PROTONIX) 40 MG tablet, Take 1 tablet (40 mg total) by mouth 2 (two) times daily before a meal., Disp: 60 tablet, Rfl: 9 .  vitamin B-12 1000 MCG tablet, Take 1 tablet (1,000 mcg total) by mouth daily., Disp: 30 tablet, Rfl: 0 .  Vitamin D, Ergocalciferol, (DRISDOL) 50000 units CAPS capsule, Take 50,000 Units by mouth every Saturday. , Disp: , Rfl: 3 No current facility-administered medications for this visit.  Facility-Administered Medications Ordered in Other Visits:  .  cyanocobalamin ((VITAMIN B-12)) injection 1,000 mcg, 1,000 mcg, Intramuscular, Q30 days, Sindy Guadeloupe, MD, 1,000 mcg at 10/09/20 1449  No results found.  No images are attached to the encounter.   CMP Latest Ref Rng & Units 07/18/2020  Glucose 70 - 99 mg/dL 81  BUN 8 - 23 mg/dL 7(L)  Creatinine 0.44 - 1.00 mg/dL 0.64  Sodium 135 - 145 mmol/L 141  Potassium 3.5 - 5.1 mmol/L 3.8  Chloride 98 - 111 mmol/L 109  CO2 22 - 32 mmol/L 25  Calcium 8.9 - 10.3 mg/dL 8.5(L)  Total Protein 6.5 - 8.1 g/dL -  Total Bilirubin 0.3 - 1.2 mg/dL -  Alkaline Phos 38 - 126 U/L -  AST 15 - 41 U/L -  ALT 0 - 44 U/L -   CBC Latest Ref Rng & Units 10/09/2020  WBC 4.0 - 10.5 K/uL 7.7  Hemoglobin 12.0 - 15.0 g/dL 12.6   Hematocrit 36 - 46 % 37.2  Platelets 150 - 400  K/uL 230     Observation/objective: Appears in no acute distress over video visit today.  Breathing is nonlabored  Assessment and plan: Patient is a 19-year female with history of iron and B12 deficiency anemia here for routine follow-up  Patient received IV iron in August and September 2021.  She also received B12 shot back then.  Today she is not anemic with a hemoglobin of 12.6 ferritin and iron studies are normal.  She will receive her B12 shot today.  CBC ferritin and iron studies in 2 months in 4 months and I will see her back in 4 months.  Lab will be a video visit.  She will receive B12 shot when she comes for labs.    Follow-up instructions:  I discussed the assessment and treatment plan with the patient. The patient was provided an opportunity to ask questions and all were answered. The patient agreed with the plan and demonstrated an understanding of the instructions.   The patient was advised to call back or seek an in-person evaluation if the symptoms worsen or if the condition fails to improve as anticipated.  I provided *** minutes of {Blank single:19197::"face-to-face video visit time","non face-to-face telephone visit time"} during this encounter, and > 50% was spent counseling as documented under my assessment & plan.  Visit Diagnosis: 1. Iron deficiency anemia due to chronic blood loss   2. B12 deficiency     Dr. Randa Evens, MD, MPH [facility] Tel- [phone] 10/14/2020 1:45 PM

## 2020-10-14 NOTE — Progress Notes (Signed)
Hematology/Oncology Consult note [facility]  [phone] [phone]  Patient Care Team: [name] as PCP - General   Name of the patient: Traci Duran  [id number]  February 22, 1956   Date of visit: 10/14/20  Diagnosis- iron and B12 deficiency anemia  Chief complaint/ Reason for visit-routine follow-up of anemia  Heme/Onc history: Patient is a 64 year old female referred for iron deficiency anemia.  Most recent CBC from 07/16/2020 showed white count of 7.9, H&H of 7.2/25.6 with an MCV of 74.9 and a platelet count of 280.  Ferritin levels were low at 19 and iron studies showed low iron saturation of 4%.  B12 was low at 187.  Folate was normal.  She also underwent small bowel endoscopy for obscure GI bleeding which showed no evidence of any pathology in proximal jejunum medium sized hiatal hernia she has previously undergone EGD and colonoscopy in October and November 2020 as well she is here for consideration of IV iron.   She received IV iron in August and September 2021 along with B12 shots.  Interval history-patient reports doing well and denies any complaints at this time.  Denies any blood loss in his stool and urine.  Denies any dark melanotic stools  ECOG PS- 1 Pain scale- 0   Review of systems- Review of Systems  Constitutional: Negative for chills, fever, malaise/fatigue and weight loss.  HENT: Negative for congestion, ear discharge and nosebleeds.   Eyes: Negative for blurred vision.  Respiratory: Negative for cough, hemoptysis, sputum production, shortness of breath and wheezing.   Cardiovascular: Negative for chest pain, palpitations, orthopnea and claudication.  Gastrointestinal: Negative for abdominal pain, blood in stool, constipation, diarrhea, heartburn, melena, nausea and vomiting.  Genitourinary: Negative for dysuria, flank pain, frequency, hematuria and urgency.  Musculoskeletal: Negative for back pain,  joint pain and myalgias.  Skin: Negative for rash.  Neurological: Negative for dizziness, tingling, focal weakness, seizures, weakness and headaches.  Endo/Heme/Allergies: Does not bruise/bleed easily.  Psychiatric/Behavioral: Negative for depression and suicidal ideas. The patient does not have insomnia.       Allergies  Allergen Reactions  . Chantix [Varenicline] Hives, Shortness Of Breath and Palpitations  . Diphenhydramine Hcl Shortness Of Breath  . Potassium Itching and Swelling  . Benadryl [Diphenhydramine Hcl] Rash  . Niacin Rash  . Trazodone Palpitations     Past Medical History:  Diagnosis Date  . Anemia   . Anxiety   . CHF (congestive heart failure) ([facility])   . COPD (chronic obstructive pulmonary disease) ([facility])   . Coronary artery disease   . Depression   . Gastritis   . GERD (gastroesophageal reflux disease)   . Hemorrhoids   . Hyperlipidemia   . Hypertension   . Myocardial infarction ([facility])   . PAD (peripheral artery disease) ([facility])      Past Surgical History:  Procedure Laterality Date  . APPENDECTOMY    . COLONOSCOPY WITH PROPOFOL N/A 06/09/2017   Procedure: COLONOSCOPY WITH PROPOFOL;  Surgeon: Midge Minium, MD;  Location: [facility] ENDOSCOPY;  Service: Endoscopy;  Laterality: N/A;  . COLONOSCOPY WITH PROPOFOL N/A 10/08/2019   Procedure: COLONOSCOPY WITH PROPOFOL;  Surgeon: Midge Minium, MD;  Location: [facility] ENDOSCOPY;  Service: Endoscopy;  Laterality: N/A;  . ENTEROSCOPY N/A 10/08/2019   Procedure: ENTEROSCOPY;  Surgeon: Midge Minium, MD;  Location: [facility] ENDOSCOPY;  Service: Endoscopy;  Laterality: N/A;  . ENTEROSCOPY N/A 07/18/2020   Procedure: ENTEROSCOPY;  Surgeon: Toney Reil, MD;  Location: [facility] ENDOSCOPY;  Service: Gastroenterology;  Laterality: N/A;  . ESOPHAGOGASTRODUODENOSCOPY N/A 07/30/2017   Procedure: ESOPHAGOGASTRODUODENOSCOPY (EGD);  Surgeon: Toney Reil, MD;  Location: [facility] ENDOSCOPY;  Service: Gastroenterology;  Laterality: N/A;  .  ESOPHAGOGASTRODUODENOSCOPY (EGD) WITH PROPOFOL N/A 04/09/2017   Procedure: ESOPHAGOGASTRODUODENOSCOPY (EGD) WITH PROPOFOL;  Surgeon: Midge Minium, MD;  Location: [facility] ENDOSCOPY;  Service: Endoscopy;  Laterality: N/A;  . GIVENS CAPSULE STUDY N/A 10/08/2019   Procedure: GIVENS CAPSULE STUDY;  Surgeon: Midge Minium, MD;  Location: [facility] ENDOSCOPY;  Service: Endoscopy;  Laterality: N/A;  . HIP FRACTURE SURGERY      Social History   Socioeconomic History  . Marital status: Widowed    Spouse name: Not on file  . Number of children: Not on file  . Years of education: Not on file  . Highest education level: Not on file  Occupational History  . Not on file  Tobacco Use  . Smoking status: Current Some Day Smoker    Packs/day: 1.00    Years: 46.00    Pack years: 46.00    Types: Cigarettes  . Smokeless tobacco: Never Used  . Tobacco comment: she feels that the 21 nicotine made her jittery and we suggested a lower level patch  Vaping Use  . Vaping Use: Never used  Substance and Sexual Activity  . Alcohol use: No  . Drug use: Yes    Types: Marijuana    Comment: monthly  . Sexual activity: Not Currently  Other Topics Concern  . Not on file  Social History Narrative  . Not on file   Social Determinants of Health   Financial Resource Strain:   . Difficulty of Paying Living Expenses: Not on file  Food Insecurity:   . Worried About Programme researcher, broadcasting/film/video in the Last Year: Not on file  . Ran Out of Food in the Last Year: Not on file  Transportation Needs:   . Lack of Transportation (Medical): Not on file  . Lack of Transportation (Non-Medical): Not on file  Physical Activity:   . Days of Exercise per Week: Not on file  . Minutes of Exercise per Session: Not on file  Stress:   . Feeling of Stress : Not on file  Social Connections:   . Frequency of Communication with Friends and Family: Not on file  . Frequency of Social Gatherings with Friends and Family: Not on file  . Attends Religious  Services: Not on file  . Active Member of Clubs or Organizations: Not on file  . Attends Banker Meetings: Not on file  . Marital Status: Not on file  Intimate Partner Violence:   . Fear of Current or Ex-Partner: Not on file  . Emotionally Abused: Not on file  . Physically Abused: Not on file  . Sexually Abused: Not on file    Family History  Problem Relation Age of Onset  . Heart failure Mother   . Heart attack Mother        mother died at 43 of an MI  . Heart attack Father        father died at 81 of an MI  . Heart attack Brother        Brother had an MI in his 37s     Current Outpatient Medications:  .  albuterol (PROVENTIL) (2.5 MG/3ML) 0.083% nebulizer solution, Take 3 mLs (2.5 mg total) by nebulization every 6 (six) hours as needed for wheezing or shortness of breath., Disp: 75 mL, Rfl: 0 .  alprazolam (  XANAX) 2 MG tablet, Take 2 mg by mouth 2 (two) times daily. , Disp: , Rfl:  .  busPIRone (BUSPAR) 10 MG tablet, Take 10 mg by mouth 2 (two) times daily. , Disp: , Rfl: 2 .  carvedilol (COREG) 6.25 MG tablet, Take 6.25 mg by mouth 2 (two) times daily., Disp: , Rfl:  .  cilostazol (PLETAL) 100 MG tablet, Take 1 tablet (100 mg total) by mouth 2 (two) times daily., Disp: 60 tablet, Rfl: 6 .  citalopram (CELEXA) 40 MG tablet, Take 40 mg by mouth daily., Disp: , Rfl:  .  DULoxetine (CYMBALTA) 30 MG capsule, Take 30 mg by mouth daily., Disp: , Rfl:  .  ferrous gluconate (FERGON) 324 MG tablet, Take 1 tablet (324 mg total) by mouth daily with breakfast., Disp: 30 tablet, Rfl: 0 .  furosemide (LASIX) 40 MG tablet, Take 1 tablet (40 mg total) by mouth daily., Disp: 30 tablet, Rfl: 0 .  gabapentin (NEURONTIN) 300 MG capsule, Take 300 mg by mouth 2 (two) times daily. , Disp: , Rfl:  .  ipratropium-albuterol (DUONEB) 0.5-2.5 (3) MG/3ML SOLN, Take 3 mLs by nebulization every 6 (six) hours as needed., Disp: 360 mL, Rfl: 0 .  SYMBICORT 80-4.5 MCG/ACT inhaler, Inhale 2 puffs into  the lungs 2 (two) times daily., Disp: , Rfl:  .  atorvastatin (LIPITOR) 80 MG tablet, Take 80 mg by mouth daily. (Patient not taking: Reported on 10/09/2020), Disp: , Rfl:  .  carvedilol (COREG) 6.25 MG tablet, Take by mouth., Disp: , Rfl:  .  isosorbide mononitrate (IMDUR) 30 MG 24 hr tablet, Take 1 tablet (30 mg total) by mouth daily. (Patient not taking: Reported on 10/09/2020), Disp: 30 tablet, Rfl: 6 .  nitroGLYCERIN (NITROSTAT) 0.4 MG SL tablet, Place 0.4 mg under the tongue as needed., Disp: , Rfl:  .  pantoprazole (PROTONIX) 40 MG tablet, Take 1 tablet (40 mg total) by mouth 2 (two) times daily before a meal., Disp: 60 tablet, Rfl: 9 .  vitamin B-12 1000 MCG tablet, Take 1 tablet (1,000 mcg total) by mouth daily., Disp: 30 tablet, Rfl: 0 .  Vitamin D, Ergocalciferol, (DRISDOL) 50000 units CAPS capsule, Take 50,000 Units by mouth every Saturday. , Disp: , Rfl: 3 No current facility-administered medications for this visit.  Facility-Administered Medications Ordered in Other Visits:  .  cyanocobalamin ((VITAMIN B-12)) injection 1,000 mcg, 1,000 mcg, Intramuscular, Q30 days, Creig Hines, MD, 1,000 mcg at 10/09/20 1449  Physical exam:  Vitals:   10/09/20 1429  BP: (!) 186/88  Pulse: 71  Resp: 18  Temp: (!) 97.5 F (36.4 C)  TempSrc: Tympanic  SpO2: 100%  Weight: 142 lb (64.4 kg)   Physical Exam Constitutional:      General: She is not in acute distress. Eyes:     Pupils: Pupils are equal, round, and reactive to light.  Cardiovascular:     Rate and Rhythm: Normal rate and regular rhythm.     Heart sounds: Normal heart sounds.  Pulmonary:     Effort: Pulmonary effort is normal.     Breath sounds: Normal breath sounds.  Abdominal:     General: Bowel sounds are normal.     Palpations: Abdomen is soft.  Skin:    General: Skin is warm and dry.  Neurological:     Mental Status: She is alert and oriented to person, place, and time.      CMP Latest Ref Rng & Units 07/18/2020    Glucose 70 -  99 mg/dL 81  BUN 8 - 23 mg/dL 7(L)  Creatinine 1.61 - 1.00 mg/dL 0.96  Sodium 045 - 409 mmol/L 141  Potassium 3.5 - 5.1 mmol/L 3.8  Chloride 98 - 111 mmol/L 109  CO2 22 - 32 mmol/L 25  Calcium 8.9 - 10.3 mg/dL 8.1(X)  Total Protein 6.5 - 8.1 g/dL -  Total Bilirubin 0.3 - 1.2 mg/dL -  Alkaline Phos 38 - 914 U/L -  AST 15 - 41 U/L -  ALT 0 - 44 U/L -   CBC Latest Ref Rng & Units 10/09/2020  WBC 4.0 - 10.5 K/uL 7.7  Hemoglobin 12.0 - 15.0 g/dL 78.2  Hematocrit 36 - 46 % 37.2  Platelets 150 - 400 K/uL 230      Assessment and plan- Assessment and plan: Patient is a 63-year female with history of iron and B12 deficiency anemia here for routine follow-up  Patient received IV iron in August and September 2021.  She also received B12 shot back then.  Today she is not anemic with a hemoglobin of 12.6 ferritin and iron studies are normal.  She will receive her B12 shot today.  CBC ferritin and iron studies in 2 months in 4 months and I will see her back in 4 months.  Lab will be a video visit.  She will receive B12 shot when she comes for labs.      Visit Diagnosis 1. Iron deficiency anemia due to chronic blood loss   2. B12 deficiency      Dr. Owens Shark, MD, MPH [facility] [phone] 10/14/2020 1:49 PM

## 2020-12-12 ENCOUNTER — Inpatient Hospital Stay: Payer: Medicare Other | Attending: Oncology

## 2020-12-12 ENCOUNTER — Inpatient Hospital Stay: Payer: Medicare Other

## 2020-12-12 ENCOUNTER — Other Ambulatory Visit: Payer: Medicare Other

## 2020-12-26 ENCOUNTER — Ambulatory Visit: Payer: Self-pay

## 2020-12-26 NOTE — Telephone Encounter (Signed)
Patient called to ask about her daughter who is in hospice. She asked should she get a second opinion about her care. I advised I am not at liberty to disclose any information and that she would need to speak with her daughter's physician.     Summary: Call back request    Pt's daughter has stage IV cervical cancer, her legs/genitalia is swollen. Pt's mother is seeking nurse advice. Pt is on percocet/morphine and still in severe pain.   Best contact: [phone]

## 2021-01-24 ENCOUNTER — Telehealth: Payer: Self-pay | Admitting: Oncology

## 2021-01-24 NOTE — Telephone Encounter (Signed)
VM left to notify pt of appt change from 3/3 to 3/15. Advised pt to please return call if this change does not work for her. Mailing updated AVS.

## 2021-02-05 ENCOUNTER — Telehealth: Payer: Self-pay | Admitting: Pulmonary Disease

## 2021-02-05 NOTE — Telephone Encounter (Signed)
Noted  

## 2021-02-05 NOTE — Telephone Encounter (Signed)
When I spoke with Traci Duran today she stated that she has a lot going on right now with her health and her terminal daughter in [place].  She also states that her shortness of breath has improved and she has been using her inhaler but it gives her heartburn.  She doesn't want to schedule PFTs right now

## 2021-02-05 NOTE — Telephone Encounter (Signed)
Routing to Dr. Gonzalez as an FYI 

## 2021-02-07 ENCOUNTER — Other Ambulatory Visit: Payer: Medicare Other

## 2021-02-07 ENCOUNTER — Ambulatory Visit: Payer: Medicare Other

## 2021-02-07 ENCOUNTER — Ambulatory Visit: Payer: Medicare Other | Admitting: Oncology

## 2021-02-19 ENCOUNTER — Inpatient Hospital Stay: Payer: Medicare Other

## 2021-02-19 ENCOUNTER — Encounter: Payer: Self-pay | Admitting: Oncology

## 2021-02-19 ENCOUNTER — Inpatient Hospital Stay (HOSPITAL_BASED_OUTPATIENT_CLINIC_OR_DEPARTMENT_OTHER): Payer: Medicare Other | Admitting: Oncology

## 2021-02-19 ENCOUNTER — Inpatient Hospital Stay: Payer: Medicare Other | Attending: Oncology

## 2021-02-19 VITALS — BP 150/79 | HR 63 | Temp 96.3°F | Resp 20 | Wt 144.8 lb

## 2021-02-19 DIAGNOSIS — E538 Deficiency of other specified B group vitamins: Secondary | ICD-10-CM | POA: Diagnosis not present

## 2021-02-19 DIAGNOSIS — D509 Iron deficiency anemia, unspecified: Secondary | ICD-10-CM | POA: Diagnosis present

## 2021-02-19 DIAGNOSIS — F17210 Nicotine dependence, cigarettes, uncomplicated: Secondary | ICD-10-CM | POA: Diagnosis not present

## 2021-02-19 DIAGNOSIS — D500 Iron deficiency anemia secondary to blood loss (chronic): Secondary | ICD-10-CM

## 2021-02-19 LAB — CBC WITH DIFFERENTIAL/PLATELET
Abs Immature Granulocytes: 0.03 10*3/uL (ref 0.00–0.07)
Basophils Absolute: 0.1 10*3/uL (ref 0.0–0.1)
Basophils Relative: 1 %
Eosinophils Absolute: 0.3 10*3/uL (ref 0.0–0.5)
Eosinophils Relative: 3 %
HCT: 36.4 % (ref 36.0–46.0)
Hemoglobin: 12 g/dL (ref 12.0–15.0)
Immature Granulocytes: 0 %
Lymphocytes Relative: 22 %
Lymphs Abs: 1.9 10*3/uL (ref 0.7–4.0)
MCH: 28.8 pg (ref 26.0–34.0)
MCHC: 33 g/dL (ref 30.0–36.0)
MCV: 87.5 fL (ref 80.0–100.0)
Monocytes Absolute: 0.7 10*3/uL (ref 0.1–1.0)
Monocytes Relative: 8 %
Neutro Abs: 5.7 10*3/uL (ref 1.7–7.7)
Neutrophils Relative %: 66 %
Platelets: 268 10*3/uL (ref 150–400)
RBC: 4.16 MIL/uL (ref 3.87–5.11)
RDW: 12.6 % (ref 11.5–15.5)
WBC: 8.6 10*3/uL (ref 4.0–10.5)
nRBC: 0 % (ref 0.0–0.2)

## 2021-02-19 LAB — VITAMIN B12: Vitamin B-12: 626 pg/mL (ref 180–914)

## 2021-02-19 LAB — IRON AND TIBC
Iron: 45 ug/dL (ref 28–170)
Saturation Ratios: 12 % (ref 10.4–31.8)
TIBC: 365 ug/dL (ref 250–450)
UIBC: 320 ug/dL

## 2021-02-19 LAB — FERRITIN: Ferritin: 18 ng/mL (ref 11–307)

## 2021-02-19 MED ORDER — ALTEPLASE 2 MG IJ SOLR
2.0000 mg | Freq: Once | INTRAMUSCULAR | Status: AC | PRN
  Filled 2021-02-19: qty 2

## 2021-02-19 MED ORDER — SODIUM CHLORIDE 0.9% FLUSH
10.0000 mL | Freq: Once | INTRAVENOUS | Status: AC | PRN
Start: 2021-02-19 — End: ?
  Filled 2021-02-19: qty 10

## 2021-02-19 MED ORDER — SODIUM CHLORIDE 0.9% FLUSH
3.0000 mL | Freq: Once | INTRAVENOUS | Status: AC | PRN
Start: 2021-02-19 — End: ?
  Filled 2021-02-19: qty 3

## 2021-02-19 MED ORDER — HEPARIN SOD (PORK) LOCK FLUSH 100 UNIT/ML IV SOLN
250.0000 [IU] | Freq: Once | INTRAVENOUS | Status: AC | PRN
Start: 2021-02-19 — End: ?
  Filled 2021-02-19: qty 5

## 2021-02-19 MED ORDER — CYANOCOBALAMIN 1000 MCG/ML IJ SOLN
1000.0000 ug | INTRAMUSCULAR | Status: AC
  Administered 2021-02-19: 1000 ug via INTRAMUSCULAR
  Filled 2021-02-19: qty 1

## 2021-02-19 MED ORDER — HEPARIN SOD (PORK) LOCK FLUSH 100 UNIT/ML IV SOLN
500.0000 [IU] | Freq: Once | INTRAVENOUS | Status: AC | PRN
Start: 2021-02-19 — End: ?
  Filled 2021-02-19: qty 5

## 2021-02-19 NOTE — Progress Notes (Signed)
Hematology/Oncology Consult note [facility]  Telephone:([phone] [phone]  Patient Care Team: [name] as PCP - General   Name of the patient: Traci Duran  [id number]  [birth year]   Date of visit: 02/19/21  Diagnosis- iron and B12 deficiency anemia  Chief complaint/ Reason for visit-routine follow-up of iron and B12 deficiency anemia  Heme/Onc history: Patient is a 65 year old female referred for iron deficiency anemia. Most recent CBC from 07/16/2020 showed white count of 7.9, H&H of 7.2/25.6 with an MCV of 74.9 and a platelet count of 280. Ferritin levels were low at 19 and iron studies showed low iron saturation of 4%. B12 was low at 187. Folate was normal. She also underwent small bowel endoscopy for obscure GI bleeding which showed no evidence of any pathology in proximal jejunum medium sized hiatal hernia she has previously undergone EGD and colonoscopy in October and November 2020 as well she is here for consideration of IV iron.   She received IV iron in August and September 2021 along with B12 shots.  Interval history-reports having ongoing fatigue which improved transiently after IV iron but returned back fairly quickly.  Appetite and weight have remained stable.  ECOG PS- 1 Pain scale- 0   Review of systems- Review of Systems  Constitutional: Positive for malaise/fatigue. Negative for chills, fever and weight loss.  HENT: Negative for congestion, ear discharge and nosebleeds.   Eyes: Negative for blurred vision.  Respiratory: Negative for cough, hemoptysis, sputum production, shortness of breath and wheezing.   Cardiovascular: Negative for chest pain, palpitations, orthopnea and claudication.  Gastrointestinal: Negative for abdominal pain, blood in stool, constipation, diarrhea, heartburn, melena, nausea and vomiting.  Genitourinary: Negative for dysuria, flank pain, frequency, hematuria and urgency.   Musculoskeletal: Negative for back pain, joint pain and myalgias.  Skin: Negative for rash.  Neurological: Negative for dizziness, tingling, focal weakness, seizures, weakness and headaches.  Endo/Heme/Allergies: Does not bruise/bleed easily.  Psychiatric/Behavioral: Negative for depression and suicidal ideas. The patient does not have insomnia.      Allergies  Allergen Reactions  . Chantix [Varenicline] Hives, Shortness Of Breath and Palpitations  . Diphenhydramine Hcl Shortness Of Breath  . Potassium Itching and Swelling  . Benadryl [Diphenhydramine Hcl] Rash  . Niacin Rash  . Trazodone Palpitations     Past Medical History:  Diagnosis Date  . Anemia   . Anxiety   . CHF (congestive heart failure) (HCC)   . COPD (chronic obstructive pulmonary disease) (HCC)   . Coronary artery disease   . Depression   . Gastritis   . GERD (gastroesophageal reflux disease)   . Hemorrhoids   . Hyperlipidemia   . Hypertension   . Myocardial infarction (HCC)   . PAD (peripheral artery disease) (HCC)      Past Surgical History:  Procedure Laterality Date  . APPENDECTOMY    . COLONOSCOPY WITH PROPOFOL N/A 06/09/2017   Procedure: COLONOSCOPY WITH PROPOFOL;  Surgeon: Midge Minium, MD;  Location: [facility] ENDOSCOPY;  Service: Endoscopy;  Laterality: N/A;  . COLONOSCOPY WITH PROPOFOL N/A 10/08/2019   Procedure: COLONOSCOPY WITH PROPOFOL;  Surgeon: Midge Minium, MD;  Location: [facility] ENDOSCOPY;  Service: Endoscopy;  Laterality: N/A;  . ENTEROSCOPY N/A 10/08/2019   Procedure: ENTEROSCOPY;  Surgeon: Midge Minium, MD;  Location: [facility] ENDOSCOPY;  Service: Endoscopy;  Laterality: N/A;  . ENTEROSCOPY N/A 07/18/2020   Procedure: ENTEROSCOPY;  Surgeon: Toney Reil, MD;  Location: [facility] ENDOSCOPY;  Service: Gastroenterology;  Laterality:  N/A;  . ESOPHAGOGASTRODUODENOSCOPY N/A 07/30/2017   Procedure: ESOPHAGOGASTRODUODENOSCOPY (EGD);  Surgeon: Toney Reil, MD;  Location: [facility] ENDOSCOPY;  Service:  Gastroenterology;  Laterality: N/A;  . ESOPHAGOGASTRODUODENOSCOPY (EGD) WITH PROPOFOL N/A 04/09/2017   Procedure: ESOPHAGOGASTRODUODENOSCOPY (EGD) WITH PROPOFOL;  Surgeon: Midge Minium, MD;  Location: [facility] ENDOSCOPY;  Service: Endoscopy;  Laterality: N/A;  . GIVENS CAPSULE STUDY N/A 10/08/2019   Procedure: GIVENS CAPSULE STUDY;  Surgeon: Midge Minium, MD;  Location: [facility] ENDOSCOPY;  Service: Endoscopy;  Laterality: N/A;  . HIP FRACTURE SURGERY      Social History   Socioeconomic History  . Marital status: Widowed    Spouse name: Not on file  . Number of children: Not on file  . Years of education: Not on file  . Highest education level: Not on file  Occupational History  . Not on file  Tobacco Use  . Smoking status: Current Some Day Smoker    Packs/day: 1.00    Years: 46.00    Pack years: 46.00    Types: Cigarettes  . Smokeless tobacco: Never Used  . Tobacco comment: she feels that the 21 nicotine made her jittery and we suggested a lower level patch  Vaping Use  . Vaping Use: Never used  Substance and Sexual Activity  . Alcohol use: No  . Drug use: Yes    Types: Marijuana    Comment: monthly  . Sexual activity: Not Currently  Other Topics Concern  . Not on file  Social History Narrative  . Not on file   Social Determinants of Health   Financial Resource Strain: Not on file  Food Insecurity: Not on file  Transportation Needs: Not on file  Physical Activity: Not on file  Stress: Not on file  Social Connections: Not on file  Intimate Partner Violence: Not on file    Family History  Problem Relation Age of Onset  . Heart failure Mother   . Heart attack Mother        mother died at 82 of an MI  . Heart attack Father        father died at 47 of an MI  . Heart attack Brother        Brother had an MI in his 67s     Current Outpatient Medications:  .  alprazolam (XANAX) 2 MG tablet, Take 2 mg by mouth 2 (two) times daily. , Disp: , Rfl:  .  atorvastatin (LIPITOR)  80 MG tablet, Take 80 mg by mouth daily., Disp: , Rfl:  .  busPIRone (BUSPAR) 10 MG tablet, Take 10 mg by mouth 2 (two) times daily. , Disp: , Rfl: 2 .  carvedilol (COREG) 6.25 MG tablet, Take 6.25 mg by mouth 2 (two) times daily., Disp: , Rfl:  .  citalopram (CELEXA) 40 MG tablet, Take 40 mg by mouth daily., Disp: , Rfl:  .  DULoxetine (CYMBALTA) 30 MG capsule, Take 30 mg by mouth daily., Disp: , Rfl:  .  ferrous gluconate (FERGON) 324 MG tablet, Take 1 tablet (324 mg total) by mouth daily with breakfast., Disp: 30 tablet, Rfl: 0 .  furosemide (LASIX) 40 MG tablet, Take 1 tablet (40 mg total) by mouth daily., Disp: 30 tablet, Rfl: 0 .  gabapentin (NEURONTIN) 300 MG capsule, Take 300 mg by mouth 2 (two) times daily. , Disp: , Rfl:  .  ipratropium-albuterol (DUONEB) 0.5-2.5 (3) MG/3ML SOLN, Take 3 mLs by nebulization every 6 (six) hours as needed., Disp: 360 mL, Rfl: 0 .  isosorbide mononitrate (IMDUR) 30 MG 24 hr tablet, Take 1 tablet (30 mg total) by mouth daily., Disp: 30 tablet, Rfl: 6 .  nitroGLYCERIN (NITROSTAT) 0.4 MG SL tablet, Place 0.4 mg under the tongue as needed., Disp: , Rfl:  .  pantoprazole (PROTONIX) 40 MG tablet, Take 1 tablet (40 mg total) by mouth 2 (two) times daily before a meal., Disp: 60 tablet, Rfl: 9 .  SYMBICORT 80-4.5 MCG/ACT inhaler, Inhale 2 puffs into the lungs 2 (two) times daily., Disp: , Rfl:  .  vitamin B-12 1000 MCG tablet, Take 1 tablet (1,000 mcg total) by mouth daily., Disp: 30 tablet, Rfl: 0 .  Vitamin D, Ergocalciferol, (DRISDOL) 50000 units CAPS capsule, Take 50,000 Units by mouth every Saturday. , Disp: , Rfl: 3 .  albuterol (PROVENTIL) (2.5 MG/3ML) 0.083% nebulizer solution, Take 3 mLs (2.5 mg total) by nebulization every 6 (six) hours as needed for wheezing or shortness of breath., Disp: 75 mL, Rfl: 0 No current facility-administered medications for this visit.  Facility-Administered Medications Ordered in Other Visits:  .  alteplase (CATHFLO ACTIVASE)  injection 2 mg, 2 mg, Intracatheter, Once PRN, Creig Hines, MD .  cyanocobalamin ((VITAMIN B-12)) injection 1,000 mcg, 1,000 mcg, Intramuscular, Q30 days, Creig Hines, MD, 1,000 mcg at 10/09/20 1449 .  cyanocobalamin ((VITAMIN B-12)) injection 1,000 mcg, 1,000 mcg, Intramuscular, Q30 days, Creig Hines, MD, 1,000 mcg at 02/19/21 1024 .  heparin lock flush 100 unit/mL, 500 Units, Intracatheter, Once PRN, Creig Hines, MD .  heparin lock flush 100 unit/mL, 250 Units, Intracatheter, Once PRN, Creig Hines, MD .  sodium chloride flush (NS) 0.9 % injection 10 mL, 10 mL, Intracatheter, Once PRN, Creig Hines, MD .  sodium chloride flush (NS) 0.9 % injection 3 mL, 3 mL, Intracatheter, Once PRN, Creig Hines, MD  Physical exam:  Vitals:   02/19/21 0939  BP: (!) 150/79  Pulse: 63  Resp: 20  Temp: (!) 96.3 F (35.7 C)  TempSrc: Tympanic  SpO2: 97%  Weight: 144 lb 12.8 oz (65.7 kg)   Physical Exam Constitutional:      General: She is not in acute distress. Cardiovascular:     Rate and Rhythm: Normal rate and regular rhythm.     Heart sounds: Normal heart sounds.  Pulmonary:     Effort: Pulmonary effort is normal.     Breath sounds: Normal breath sounds.  Abdominal:     General: Bowel sounds are normal.     Palpations: Abdomen is soft.  Skin:    General: Skin is warm and dry.  Neurological:     Mental Status: She is alert and oriented to person, place, and time.      CMP Latest Ref Rng & Units 07/18/2020  Glucose 70 - 99 mg/dL 81  BUN 8 - 23 mg/dL 7(L)  Creatinine 8.29 - 1.00 mg/dL 5.62  Sodium 130 - 865 mmol/L 141  Potassium 3.5 - 5.1 mmol/L 3.8  Chloride 98 - 111 mmol/L 109  CO2 22 - 32 mmol/L 25  Calcium 8.9 - 10.3 mg/dL 7.8(I)  Total Protein 6.5 - 8.1 g/dL -  Total Bilirubin 0.3 - 1.2 mg/dL -  Alkaline Phos 38 - 696 U/L -  AST 15 - 41 U/L -  ALT 0 - 44 U/L -   CBC Latest Ref Rng & Units 02/19/2021  WBC 4.0 - 10.5 K/uL 8.6  Hemoglobin 12.0 - 15.0 g/dL 29.5   Hematocrit 28.4 - 46.0 % 36.4  Platelets 150 - 400 K/uL 268      Assessment and plan- Patient is a 65 y.o. female with history of iron and B12 deficiency anemia here for routine follow-up  Patient's hemoglobin has remained stable around 12 for the last 4 months after receiving IV iron.  Her iron studies are presently normal.  B12 levels are not back today.  Her fatigue is not presently explained by her Hemoglobin.  We will repeat CBC ferritin and iron studies in 3 in 6 months and I will see her back in 6 months.  Also check a TSH in 3 months.  She will receive her B12 shot today and again in 3 in 6 months as well.  I have encouraged patient to continue oral B12    Visit Diagnosis 1. Iron deficiency anemia, unspecified iron deficiency anemia type   2. B12 deficiency      Dr. Owens Shark, MD, MPH [facility] [phone] 02/19/2021 1:38 PM

## 2021-05-22 ENCOUNTER — Inpatient Hospital Stay: Payer: Medicare Other

## 2021-05-22 ENCOUNTER — Inpatient Hospital Stay: Payer: Medicare Other | Attending: Oncology

## 2021-06-12 ENCOUNTER — Inpatient Hospital Stay: Payer: Medicare Other

## 2021-06-17 ENCOUNTER — Inpatient Hospital Stay (HOSPITAL_BASED_OUTPATIENT_CLINIC_OR_DEPARTMENT_OTHER): Payer: Medicare Other | Admitting: Hospice and Palliative Medicine

## 2021-06-17 ENCOUNTER — Inpatient Hospital Stay: Payer: Medicare Other | Attending: Oncology

## 2021-06-17 ENCOUNTER — Telehealth: Payer: Self-pay

## 2021-06-17 ENCOUNTER — Inpatient Hospital Stay: Payer: Medicare Other

## 2021-06-17 ENCOUNTER — Other Ambulatory Visit: Payer: Self-pay

## 2021-06-17 VITALS — Wt 143.2 lb

## 2021-06-17 VITALS — BP 174/71 | HR 74

## 2021-06-17 DIAGNOSIS — D509 Iron deficiency anemia, unspecified: Secondary | ICD-10-CM | POA: Insufficient documentation

## 2021-06-17 DIAGNOSIS — D500 Iron deficiency anemia secondary to blood loss (chronic): Secondary | ICD-10-CM

## 2021-06-17 DIAGNOSIS — Z79899 Other long term (current) drug therapy: Secondary | ICD-10-CM | POA: Diagnosis not present

## 2021-06-17 DIAGNOSIS — E538 Deficiency of other specified B group vitamins: Secondary | ICD-10-CM

## 2021-06-17 DIAGNOSIS — F17210 Nicotine dependence, cigarettes, uncomplicated: Secondary | ICD-10-CM | POA: Diagnosis not present

## 2021-06-17 LAB — CBC WITH DIFFERENTIAL/PLATELET
Abs Immature Granulocytes: 0.04 10*3/uL (ref 0.00–0.07)
Basophils Absolute: 0.1 10*3/uL (ref 0.0–0.1)
Basophils Relative: 1 %
Eosinophils Absolute: 0.1 10*3/uL (ref 0.0–0.5)
Eosinophils Relative: 2 %
HCT: 33.9 % — ABNORMAL LOW (ref 36.0–46.0)
Hemoglobin: 10.7 g/dL — ABNORMAL LOW (ref 12.0–15.0)
Immature Granulocytes: 1 %
Lymphocytes Relative: 25 %
Lymphs Abs: 2 10*3/uL (ref 0.7–4.0)
MCH: 24.8 pg — ABNORMAL LOW (ref 26.0–34.0)
MCHC: 31.6 g/dL (ref 30.0–36.0)
MCV: 78.5 fL — ABNORMAL LOW (ref 80.0–100.0)
Monocytes Absolute: 0.4 10*3/uL (ref 0.1–1.0)
Monocytes Relative: 5 %
Neutro Abs: 5.5 10*3/uL (ref 1.7–7.7)
Neutrophils Relative %: 66 %
Platelets: 292 10*3/uL (ref 150–400)
RBC: 4.32 MIL/uL (ref 3.87–5.11)
RDW: 15.9 % — ABNORMAL HIGH (ref 11.5–15.5)
WBC: 8.1 10*3/uL (ref 4.0–10.5)
nRBC: 0 % (ref 0.0–0.2)

## 2021-06-17 LAB — IRON AND TIBC
Iron: 24 ug/dL — ABNORMAL LOW (ref 28–170)
Saturation Ratios: 6 % — ABNORMAL LOW (ref 10.4–31.8)
TIBC: 374 ug/dL (ref 250–450)
UIBC: 350 ug/dL

## 2021-06-17 LAB — FERRITIN: Ferritin: 11 ng/mL (ref 11–307)

## 2021-06-17 LAB — VITAMIN B12: Vitamin B-12: 954 pg/mL — ABNORMAL HIGH (ref 180–914)

## 2021-06-17 MED ORDER — CYANOCOBALAMIN 1000 MCG/ML IJ SOLN
1000.0000 ug | INTRAMUSCULAR | Status: DC
  Administered 2021-06-17: 1000 ug via INTRAMUSCULAR
  Filled 2021-06-17: qty 1

## 2021-06-17 NOTE — Telephone Encounter (Signed)
Patient states the past two weeks she has been very Fatigue. Patient also states she has been dizzy and light headed. Patient states she is not able to eat and has been having restless legs. Patient states she lost 10lbs in the last two weeks and she believes it was fluid. She has started taking fluid pills recently and cut back her water in take. Patient has also some shortness of breathe.

## 2021-06-17 NOTE — Telephone Encounter (Signed)
Pt scheduled an add-on [facility] appointment while she was in lab getting injection. Brought pt back to exam room, and in talking with patient about her concerns, she thought that she was supposed to get an iron infusion today. I explained to her that her iron studies were still pending, and that we need those results in order to know if she needs iron, and I explained to her that she would be scheduled for an infusion if she needs it. She verbalized understanding and stated that she did not need to see a provider today and left the building.

## 2021-06-18 ENCOUNTER — Encounter: Payer: Self-pay | Admitting: Oncology

## 2021-06-18 ENCOUNTER — Telehealth: Payer: Self-pay | Admitting: Oncology

## 2021-06-18 NOTE — Progress Notes (Signed)
Patient requested not to be seen today.

## 2021-06-18 NOTE — Progress Notes (Signed)
Morning Peggye Form! I see she's had both Venofer and Feraheme in the past?

## 2021-06-18 NOTE — Telephone Encounter (Signed)
Attempts made to call patient's home phone (not accepting calls at this time--please try again alter) and cell phone (mailbox is full) in order to schedule 2 doses of Feraheme.

## 2021-06-19 ENCOUNTER — Telehealth: Payer: Self-pay | Admitting: Oncology

## 2021-06-19 NOTE — Telephone Encounter (Signed)
Attempt made to reach patient--no answer/not able to complete call on both of her #s. Called her grandson who initially answered but then disconnected the call.

## 2021-06-20 ENCOUNTER — Telehealth: Payer: Self-pay | Admitting: Oncology

## 2021-06-20 NOTE — Telephone Encounter (Signed)
Second attempt made to call patient--mailbox is full. Tried home phone and grandson as well. Mailing AVS with note for patient to please call back to set up iron infusion.

## 2021-06-24 ENCOUNTER — Telehealth: Payer: Self-pay | Admitting: Oncology

## 2021-06-24 NOTE — Telephone Encounter (Signed)
Attempt made to contact patient about setting up iron infusions. Phone rang for several minutes with no answer.   Called the other # listed and received the error message "your call cannot be completed at this time".

## 2021-07-04 ENCOUNTER — Inpatient Hospital Stay: Payer: Medicare Other

## 2021-07-05 ENCOUNTER — Other Ambulatory Visit: Payer: Self-pay

## 2021-07-05 ENCOUNTER — Inpatient Hospital Stay: Payer: Medicare Other

## 2021-07-05 VITALS — BP 158/75 | HR 68 | Temp 97.0°F | Resp 20

## 2021-07-05 DIAGNOSIS — D500 Iron deficiency anemia secondary to blood loss (chronic): Secondary | ICD-10-CM

## 2021-07-05 DIAGNOSIS — D509 Iron deficiency anemia, unspecified: Secondary | ICD-10-CM | POA: Diagnosis not present

## 2021-07-05 MED ORDER — SODIUM CHLORIDE 0.9 % IV SOLN
300.0000 mg | Freq: Once | INTRAVENOUS | Status: AC
  Administered 2021-07-05: 300 mg via INTRAVENOUS
  Filled 2021-07-05: qty 15

## 2021-07-05 MED ORDER — SODIUM CHLORIDE 0.9 % IV SOLN
Freq: Once | INTRAVENOUS | Status: AC
Start: 2021-07-05 — End: 2021-07-05
  Filled 2021-07-05: qty 250

## 2021-07-05 NOTE — Patient Instructions (Signed)
[facility]   Discharge Instructions: Thank you for choosing [facility] to provide your oncology and hematology care.  If you have a lab appointment with the [facility], please go directly to the [facility] and check in at the registration area.  Wear comfortable clothing and clothing appropriate for easy access to any Portacath or PICC line.   We strive to give you quality time with your provider. You may need to reschedule your appointment if you arrive late (15 or more minutes).  Arriving late affects you and other patients whose appointments are after yours.  Also, if you miss three or more appointments without notifying the office, you may be dismissed from the clinic at the provider's discretion.      For prescription refill requests, have your pharmacy contact our office and allow 72 hours for refills to be completed.    BELOW ARE SYMPTOMS THAT SHOULD BE REPORTED IMMEDIATELY: *FEVER GREATER THAN 100.4 F (38 C) OR HIGHER *CHILLS OR SWEATING *NAUSEA AND VOMITING THAT IS NOT CONTROLLED WITH YOUR NAUSEA MEDICATION *UNUSUAL SHORTNESS OF BREATH *UNUSUAL BRUISING OR BLEEDING *URINARY PROBLEMS (pain or burning when urinating, or frequent urination) *BOWEL PROBLEMS (unusual diarrhea, constipation, pain near the anus) TENDERNESS IN MOUTH AND THROAT WITH OR WITHOUT PRESENCE OF ULCERS (sore throat, sores in mouth, or a toothache) UNUSUAL RASH, SWELLING OR PAIN  UNUSUAL VAGINAL DISCHARGE OR ITCHING   Items with * indicate a potential emergency and should be followed up as soon as possible or go to the Emergency Department if any problems should occur.  Should you have questions after your visit or need to cancel or reschedule your appointment, please contact [facility]  [phone] and follow the prompts.  Office hours are 8:00 a.m. to 4:30 p.m. Monday - Friday. Please note that voicemails left after  4:00 p.m. may not be returned until the following business day.  We are closed weekends and major holidays. You have access to a nurse at all times for urgent questions. Please call the main number to the clinic [phone] and follow the prompts.  For any non-urgent questions, you may also contact your provider using MyChart. We now offer e-Visits for anyone 80 and older to request care online for non-urgent symptoms. For details visit [email or web address].   Also download the MyChart app! Go to the app store, search "MyChart", open the app, select [facility], and log in with your MyChart username and password.  Due to Covid, a mask is required upon entering the hospital/clinic. If you do not have a mask, one will be given to you upon arrival. For doctor visits, patients may have 1 support person aged 58 or older with them. For treatment visits, patients cannot have anyone with them due to current Covid guidelines and our immunocompromised population.   Iron Sucrose injection What is this medication? IRON SUCROSE (AHY ern SOO krohs) is an iron complex. Iron is used to make healthy red blood cells, which carry oxygen and nutrients throughout the body. This medicine is used to treat iron deficiency anemia in people with chronickidney disease. This medicine may be used for other purposes; ask your health care provider orpharmacist if you have questions. COMMON BRAND NAME(S): Venofer What should I tell my care team before I take this medication? They need to know if you have any of these conditions: anemia not caused by low iron levels heart disease high levels of iron in the blood  kidney disease liver disease an unusual or allergic reaction to iron, other medicines, foods, dyes, or preservatives pregnant or trying to get pregnant breast-feeding How should I use this medication? This medicine is for infusion into a vein. It is given by a health careprofessional in a hospital or clinic  setting. Talk to your pediatrician regarding the use of this medicine in children. While this drug may be prescribed for children as young as 2 years for selectedconditions, precautions do apply. Overdosage: If you think you have taken too much of this medicine contact apoison control center or emergency room at once. NOTE: This medicine is only for you. Do not share this medicine with others. What if I miss a dose? It is important not to miss your dose. Call your doctor or health careprofessional if you are unable to keep an appointment. What may interact with this medication? Do not take this medicine with any of the following medications: deferoxamine dimercaprol other iron products This medicine may also interact with the following medications: chloramphenicol deferasirox This list may not describe all possible interactions. Give your health care provider a list of all the medicines, herbs, non-prescription drugs, or dietary supplements you use. Also tell them if you smoke, drink alcohol, or use illegaldrugs. Some items may interact with your medicine. What should I watch for while using this medication? Visit your doctor or healthcare professional regularly. Tell your doctor or healthcare professional if your symptoms do not start to get better or if theyget worse. You may need blood work done while you are taking this medicine. You may need to follow a special diet. Talk to your doctor. Foods that contain iron include: whole grains/cereals, dried fruits, beans, or peas, leafy greenvegetables, and organ meats (liver, kidney). What side effects may I notice from receiving this medication? Side effects that you should report to your doctor or health care professionalas soon as possible: allergic reactions like skin rash, itching or hives, swelling of the face, lips, or tongue breathing problems changes in blood pressure cough fast, irregular heartbeat feeling faint or lightheaded,  falls fever or chills flushing, sweating, or hot feelings joint or muscle aches/pains seizures swelling of the ankles or feet unusually weak or tired Side effects that usually do not require medical attention (report to yourdoctor or health care professional if they continue or are bothersome): diarrhea feeling achy headache irritation at site where injected nausea, vomiting stomach upset tiredness This list may not describe all possible side effects. Call your doctor for medical advice about side effects. You may report side effects to FDA at1-800-FDA-1088. Where should I keep my medication? This drug is given in a hospital or clinic and will not be stored at home. NOTE: This sheet is a summary. It may not cover all possible information. If you have questions about this medicine, talk to your doctor, pharmacist, orhealth care provider.  2022 Elsevier/Gold Standard (2011-09-04 17:14:35)

## 2021-07-11 ENCOUNTER — Inpatient Hospital Stay: Payer: Medicare Other | Attending: Oncology

## 2021-07-30 ENCOUNTER — Inpatient Hospital Stay: Payer: Medicare Other | Attending: Oncology

## 2021-07-30 VITALS — BP 132/66 | HR 63 | Temp 97.0°F | Resp 18

## 2021-07-30 DIAGNOSIS — D509 Iron deficiency anemia, unspecified: Secondary | ICD-10-CM | POA: Insufficient documentation

## 2021-07-30 DIAGNOSIS — D500 Iron deficiency anemia secondary to blood loss (chronic): Secondary | ICD-10-CM

## 2021-07-30 MED ORDER — ACETAMINOPHEN 325 MG PO TABS
650.0000 mg | ORAL_TABLET | Freq: Once | ORAL | Status: AC
  Administered 2021-07-30: 650 mg via ORAL
  Filled 2021-07-30: qty 2

## 2021-07-30 MED ORDER — SODIUM CHLORIDE 0.9 % IV SOLN
Freq: Once | INTRAVENOUS | Status: AC
Start: 2021-07-30 — End: 2021-07-30
  Filled 2021-07-30: qty 250

## 2021-07-30 MED ORDER — SODIUM CHLORIDE 0.9 % IV SOLN
300.0000 mg | Freq: Once | INTRAVENOUS | Status: AC
  Administered 2021-07-30: 300 mg via INTRAVENOUS
  Filled 2021-07-30: qty 15

## 2021-07-30 NOTE — Progress Notes (Signed)
Pt states she has a toothache and a headache rated a 10 out of 10. she states "I just don't feel good". RN offers for pt to be seen by a provider, pt declines stating "No I do not need to see anyone but I want some Tylenol for this tooth ache" I directed her to be seen by a dentist and she states she is concerned of bleeding if the tooth is pulled. Dr. Janese Banks aware and Per Dr. Janese Banks give 650 mg PO Tylenol and pt to be seen by a dentist. Pt aware and agrees with plan. Pt educated to discuss concerns of bleeding with the dentist, pt verbalizes understanding.  1555: Pt reports she "feels much better" Pt and VS stable at discharge.

## 2021-07-30 NOTE — Patient Instructions (Signed)

## 2021-08-22 ENCOUNTER — Inpatient Hospital Stay: Payer: Medicare Other | Admitting: Oncology

## 2021-08-22 ENCOUNTER — Inpatient Hospital Stay: Payer: Medicare Other

## 2021-09-26 ENCOUNTER — Ambulatory Visit (INDEPENDENT_AMBULATORY_CARE_PROVIDER_SITE_OTHER): Payer: Medicare Other | Admitting: Cardiology

## 2021-09-26 ENCOUNTER — Encounter: Payer: Self-pay | Admitting: Cardiology

## 2021-09-26 ENCOUNTER — Other Ambulatory Visit: Payer: Self-pay

## 2021-09-26 VITALS — BP 90/68 | HR 68 | Ht 67.0 in | Wt 142.0 lb

## 2021-09-26 DIAGNOSIS — I340 Nonrheumatic mitral (valve) insufficiency: Secondary | ICD-10-CM | POA: Diagnosis not present

## 2021-09-26 DIAGNOSIS — I771 Stricture of artery: Secondary | ICD-10-CM

## 2021-09-26 DIAGNOSIS — E785 Hyperlipidemia, unspecified: Secondary | ICD-10-CM

## 2021-09-26 DIAGNOSIS — I10 Essential (primary) hypertension: Secondary | ICD-10-CM

## 2021-09-26 DIAGNOSIS — I739 Peripheral vascular disease, unspecified: Secondary | ICD-10-CM

## 2021-09-26 DIAGNOSIS — I2510 Atherosclerotic heart disease of native coronary artery without angina pectoris: Secondary | ICD-10-CM | POA: Diagnosis not present

## 2021-09-26 DIAGNOSIS — I272 Pulmonary hypertension, unspecified: Secondary | ICD-10-CM | POA: Diagnosis not present

## 2021-09-26 DIAGNOSIS — D500 Iron deficiency anemia secondary to blood loss (chronic): Secondary | ICD-10-CM

## 2021-09-26 DIAGNOSIS — I6523 Occlusion and stenosis of bilateral carotid arteries: Secondary | ICD-10-CM

## 2021-09-26 DIAGNOSIS — I2729 Other secondary pulmonary hypertension: Secondary | ICD-10-CM

## 2021-09-26 DIAGNOSIS — I5032 Chronic diastolic (congestive) heart failure: Secondary | ICD-10-CM

## 2021-09-26 MED ORDER — EZETIMIBE 10 MG PO TABS: 10.0000 mg | ORAL_TABLET | Freq: Every day | ORAL | 1 refills | Status: DC

## 2021-09-26 MED ORDER — LISINOPRIL 10 MG PO TABS: 10.0000 mg | ORAL_TABLET | Freq: Every day | ORAL | 1 refills | Status: DC

## 2021-09-26 NOTE — Assessment & Plan Note (Signed)
Most recent echo showed a severely elevated pressures.  This was in setting of acute illness.  Berry to recheck her echocardiogram now to see if this is still the same.  Will refer back to pulmonary medicine.  If it becomes necessary, would be happy to assist with right heart catheterization in order to confirm recordings. Otherwise, with no real change in symptoms, but would probably hold off for now.

## 2021-09-26 NOTE — Progress Notes (Signed)
Primary Care Provider: Langley [facility] Cardiologist: None Electrophysiologist: None Vasc Sgx: Dr. Delana Meyer [facility]) Pulmonary Med: Dr. Vernard Gambles  Clinic Note: Chief Complaint  Patient presents with   Follow-up    Establish care with provider for murmur and history of heart disease. Medications verbally reviewed with patient.  ->  PCP is not aware that patient was already a patient of this clinic.  Previously seen by Dr. Garen Lah   Cardiac Valve Problem    Cardiogram shows moderate to severe MR, severe TR and mild aortic stenosis.   Coronary Artery Disease    Distal vessel disease from cath in 2010 with apical ischemia/infarct on Myoview from 2010 to present. No angina symptoms.  Just dyspnea.   Congestive Heart Failure    HFpEF   ===================================  ASSESSMENT/PLAN   Problem List Items Addressed This Visit       Cardiology Problems   PAD (peripheral artery disease) (HCC) (Chronic)    Extensive PAD history with claudication limits her activity.  We will likely add Pletal when I see her back. Aggressive atherosclerotic risk factor modification: High-dose statin but lipids not at goal.  Blood pressure actually low, need to back off.  Continue to encourage walking.Marland Kitchen  Refer back to vascular surgery      Relevant Medications   ezetimibe (ZETIA) 10 MG tablet   lisinopril (ZESTRIL) 10 MG tablet   Other Relevant Orders   Ambulatory referral to Vascular Surgery   Coronary artery disease, non-occlusive (Chronic)    Nonocclusive disease but with very distal small vessel disease.  She had a longstanding Myoview that is abnormal showing apical inferior defect is mostly fixed partially reversible.  This probably goes along with known anatomy.  I doubt that it is any better than it was in 2010/2011.  With no active exertional angina at this point, would not recommend any further ischemic evaluation since her stress tests have not  changed.  Plan: Continue high-dose atorvastatin, but likely need to titrate her medications more by adding Zetia for now.  Suspect if she may require CVRR evaluation for possible PCSK9 inhibitor versus inclisiran. At a minimum should be on aspirin Is on beta-blocker and ACE-I although I am going to reduce lisinopril by to 10 mg atenolol daily. On Imdur that was started last year.  She is not complaining of any chest discomfort at this point.  As such, we will continue.        Relevant Medications   ezetimibe (ZETIA) 10 MG tablet   lisinopril (ZESTRIL) 10 MG tablet   Other Relevant Orders   EKG 12-Lead   Essential hypertension - check pressures on R arm (Chronic)    Not hypotensive today, infectious hypotensive and dizzy.  Reduce lisinopril dose to 10 mg.  Continue to make sure that she does not take all medications when her blood pressures are low.  NEED TO ENSURE THAT ALL BLOOD PRESSURE RECORDINGS ARE DONE ON THE RIGHT ARM AS OPPOSED TO LEFT ARM.!!!!      Relevant Medications   ezetimibe (ZETIA) 10 MG tablet   lisinopril (ZESTRIL) 10 MG tablet   Chronic heart failure with preserved ejection fraction (HFpEF) (HCC) (Chronic)    Not really having any heart failure symptoms.  She is on a standing dose of Lasix and relatively euvolemic.  Edema well controlled.  She does note that if she does not take it she will get edema.  She is also on modest dose of Coreg but  high-dose lisinopril.  ->  For now we will reduce lisinopril down to 10 mg daily.  Based on her hypotension the day on the reduce it since he is dizzy but may need to continue to monitor pressures from the right arm for more accurate measurements.      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  That we will refer back to to ensure that she stays  active. Recently had carotid Dopplers checked. She does note that her left hand gets cold but does not necessarily note a true claudication symptoms.      Relevant Medications   ezetimibe (ZETIA) 10 MG tablet   lisinopril (ZESTRIL) 10 MG tablet   Other Relevant Orders   Ambulatory referral to Vascular Surgery   Moderate to severe mitral regurgitation - Primary (Chronic)    Slow progression of mitral irritation based on reports of echocardiograms.  Unfortunately I will have a 1 local echo to look at.  Since there was progression of disease, we will recheck an echocardiogram this year to assess EF as well as mitral and aortic valve disease.  I do not think she is at a stage yet we will consider more aggressive evaluation, testing to continue to monitor.  Continue blood pressure control but avoid hypotension.  Follow-up 2D echocardiogram      Relevant Medications   ezetimibe (ZETIA) 10 MG tablet   lisinopril (ZESTRIL) 10 MG tablet   Other Relevant Orders   EKG 12-Lead   ECHOCARDIOGRAM COMPLETE   Bilateral carotid artery stenosis without cerebral infarction (Chronic)   Relevant Medications   ezetimibe (ZETIA) 10 MG tablet   lisinopril (ZESTRIL) 10 MG tablet   Hyperlipidemia LDL goal <70 (Chronic)    Most recent LDL was not at goal.  For as much vascular disease that she has, her LDL target should be less than 70 if not more so.  She is already on 61 of Lipitor so I suspect that we may need to be more aggressive with injection medications.  For now, will start Zetia 10 mg and reevaluate in roughly 3 months.      Relevant Medications   ezetimibe (ZETIA) 10 MG tablet   lisinopril (ZESTRIL) 10 MG tablet     Other   Iron deficiency anemia secondary to blood loss (chronic)    Most recent hemoglobin was over 10.  Has not been on even aspirin because of prior bleed.  Need to confirm with hematology oncology, but I would like for her to at least be on aspirin based on her extensive  vascular disease.      Other Visit Diagnoses     Pulmonary hypertension, unspecified ([place])       Relevant Medications   ezetimibe (ZETIA) 10 MG tablet   lisinopril (ZESTRIL) 10 MG tablet   Other Relevant Orders   EKG 12-Lead   Ambulatory referral to Pulmonology   ECHOCARDIOGRAM COMPLETE       ===================================  HPI:    Traci Duran is a 65 y.o. female with a PMH notable for CAD small vessel disease of the right PDA with history of demand ischemia), Bilateral Carotid Artery Stenosis, PAD (with claudication-bilateral CFA/SFA stenosis along with occluded tibial arteries), chronic HFpEF, COPD, Long-Term Smoker  40+ Years, History of GI Bleed with Anemia as well as Anxiety who presents  today for what really is a 1 year follow-up, but with new PCP, she was referred as a New Patient to establish cardiology care.Alphia Moh was last seen by Dr. Danise Mina from [place] ([place] on June 07, 2019 shortly after hospitalization for what sounds a combination of COPD/CHF exacerbation.  This was at [facility].  Apparently she had an echocardiogram showing mild to moderate MR with mild to moderate mitral stenosis.  She was noted to have a 5 pound gain in weight since discharge-indicated that her Lasix had not arrived by mail order yet.  Continues to smoke.    MERIDETH BOSQUE was last seen on July 16, 2020 by Dr. Garen Lah for ER follow-up.  Prior to that visit she had been seen for chest discomfort and abnormal stress test but no further evaluation done due to issues with GI bleed.  She was started on Imdur.  In the ER on July 12, 2020 she noted dizziness fatigue.  Blood work revealed a hemoglobin of 7.3, but she left the ER without being evaluated. -> In this follow-up visit she was noted orthostatic quite dizzy.  Very fatigued.  Recommended that she return to the ER and have GI evaluation with possible transfusion.  Recent  Hospitalizations: No recent hospitalizations.  Reviewed  CV studies:    The following studies were reviewed today: (if available, images/films reviewed: From Epic Chart or Care Everywhere) TTE 06/13/2020: EF 60-65%.  Normal LV function.  Indeterminate diastolic pressures.  Mild RV enlargement with severely elevated PAP estimated 95 mmHg. moderate biatrial dilation.  Moderate to severe MR.  Moderate to severe TR.  Mild AS. Myoview September 2019: Small sized mild severity defect in the apical anterior wall concerning for ischemia.  Read as intermediate risk. LEA Dopplers 06/13/2020:  Right: 30-49% stenosis noted in the common femoral artery. 30-49% stenosis noted in the superficial femoral artery. Total occlusion noted in the posterior tibial artery. Left: 30-49% stenosis noted in the common femoral artery. 50-74% stenosis noted in the deep femoral artery. 30-49% stenosis noted in the superficial femoral artery. Total occlusion noted in the anterior tibial artery. Total occlusion noted in the posterior tibial artery.  ------------- ([facility]):   CAD: MYOVIEW/CARDIOLITE/CATH A) 07/2008: Minimal reversible defect in the apex with possible ischemia.  Likely represents artifact.  EF 59% ->  Cath 10/2009:  EF 85%;  40% prox &mid RCA -> 80% RPDA and AMrg, 60% RPAV.  40% prox & distal LAD w. 60% dLAD and D2, - Med Rx B) 08/2010: Lexiscan - > ischemia the Ant & Inf Apex.  EF 64%.(Med Rx);  C) Jan 2013: + Apical Ischemia - unchanged;  D) Jan 2016: EF 76%. No Ischemia /Infarct. HFpEF/Vavlular Heart Disease => ECHO ([facility]):  December 2010, 2D echo: EF >55%, mild concentric LVH, diastolic dysfunction Gr. I, LVIDd 4.2 cm, LVIDs 2.3 cm, mild TR (peak RVP 28 mmHg).   November 2011, 2D echo: EF >55%, mild concentric LVH, diastolic dysfunction Gr. I, LVIDd 4.3 cm, LVIDs 1.7 cm, mild PR (peak RVP 25 mmHg).   12/2014 Echo: EF >55%, mild LVH, DD gr. I, LAE 3.9cm, Moderate TR  (PRVP 35 mmHg)  PAD -> Summarized in problem list overview   November 2011, CTA of the head and neck: Near complete occlusion versus severe stenosis of nearly the entire basilar artery, complete occlusion of the right vertebral artery from its origin off the right SCA, moderate noncalcified atherosclerotic in the proximal left SCA  which results in a 40% stenosis. Right ICA with approximately 15% stenosis, left   Nov. 2012 Carotid Duplex: >75% diameter reduction of the dist. LCCA-L.ICA, R. CCA-ICA with 50-70%   Nov. 2012 Chest CT: 75% focal mid L. SCA stenosis, occluded prox. R. VA  08/2015 Head/Neck CTA: < 50% bilat ICA stenosis, normal SCA bilaterally   Interval History:   PRESLEA RHODUS presents here today unaware of the fact that she had had 3 previous visits at this very office with Dr. Garen Lah.  She is actually also been seen by Dr. Patsey Berthold in the pulmonary department of the same clinic.  She is a relatively poor historian overall. She has an extensive medical history as reviewed above, but it is very difficult to understand if she actually has any major complaints.  It sounds like she is very much limited by her bilateral calf claudication.  She will get short of breath walking up steps, but not with walking around the house.  She denies any chest pain with rest or exertion to the extent that she is able to exert.  She does feel her heart rate going "fast all the time" if she does any exertion, but cannot provide any more details.  She intermittently dizzy and lightheaded with certain movements and fast heart rates.  She may get dizzy if she stands up quickly.  She also notes her hands and feet get cold easily and cramp.  Despite feeling her heart rate going fast, she has not had any true syncope.  She may get lightheaded but no true syncope.  No irregular fast heart rates.  No PND, orthopnea or edema.  CV Review of Symptoms (Summary) Cardiovascular ROS: positive for - dyspnea on  exertion, palpitations, rapid heart rate, shortness of breath, and claudication, dizziness/near syncope negative for - chest pain, edema, irregular heartbeat, loss of consciousness, orthopnea, paroxysmal nocturnal dyspnea, or TIA/amaurosis fugax.  Melena, hematochezia, hematuria or epistaxis.  REVIEWED OF SYSTEMS   Review of Systems  Constitutional:  Positive for malaise/fatigue. Negative for weight loss.  HENT:  Positive for congestion. Negative for nosebleeds.   Respiratory:  Positive for cough, shortness of breath and wheezing. Negative for sputum production.   Cardiovascular:        Per HPI  Gastrointestinal:  Negative for abdominal pain, blood in stool and melena.  Genitourinary:  Negative for hematuria.  Musculoskeletal:  Positive for joint pain.  Skin:        She thinks her skin is changing color.  Little purple spots noted on abdomen and legs.  Neurological:  Positive for dizziness and weakness (Generalized). Negative for focal weakness.  Endo/Heme/Allergies:  Does not bruise/bleed easily.  Psychiatric/Behavioral:  Positive for depression (Very down because of now all 3 of her daughters having passed as well as her dog.  She feels lonely.) and memory loss. The patient is nervous/anxious. The patient does not have insomnia.    I have reviewed and (if needed) personally updated the patient's problem list, medications, allergies, past medical and surgical history, social and family history.   PAST MEDICAL HISTORY   Past Medical History:  Diagnosis Date   Anemia 05/2020   Thought to be related to GI bleed.   Anxiety    Bilateral carotid artery stenosis without cerebral infarction 10/2010    November 2011, CTA of the head and neck: Near complete occlusion versus severe stenosis of nearly the entire basilar artery, complete occlusion of the right vertebral artery from its origin off the right  SCA, moderate noncalcified atherosclerotic in the proximal left SCA which results in a 40%  stenosis. Right ICA with approximately 15% stenosis, left   Nov. 2012 Carotid Duplex: >75% diameter re   CHF (congestive heart failure), chronic HFrEF NYHA class II, chronic, diastolic (HCC)    Chronic HFrEF   COPD (chronic obstructive pulmonary disease) (HCC)    Coronary artery disease, non-occlusive 10/2009   Cardiac Cath: EF 85%;  40% prox &mid RCA -> 80% RPDA and AMrg, 60% RPAV.  40% prox & distal LAD w. 60% dLAD and D2, - Med Rx; Myoview 9/19: Small size, Mod severity Apical Anterior defect  c/w ISCHEMIA. -> INTERMEDIATE RISK (med Rx because of anemia, and known distal LAD and PDA disease. ->  Similar defect noted in August 2009)   Depression    Gastritis    GERD (gastroesophageal reflux disease)    Hemorrhoids    Hyperlipidemia    Hypertension    Mild aortic stenosis by prior echocardiogram 06/2020   Mild aortic stenosis by echo   Moderate to severe mitral regurgitation 06/2020   Echo at [facility]: Moderate-severe MR with mild RV enlargement and severely elevated PA 95 mmHg.   Myocardial infarction [facility])    Demand ischemia infarction initially in 2010   PAD (peripheral artery disease) ([place]) 07/2008   a) 8/'09: normal ABIs; b) post procedure Dopplers December 2010: Suggestion of left PFA/CFA-SFV AV fistula.; c) 02/2010: Left CFA-PFA AV fistula still present.; d) 04/2010: CTA Abd/Pelvis- LE Runoff: Extensive Mixed plaque in the Abd Ao.  Patent visceral As.  Bilat 2V runnoff-feet ATA & Peroneal A;  (confirmed by Arteriogram 03/2011); e) ABIs October 2017 R ABI 0.88, L ABI 0.79.   Pulmonary hypertension ([place]) 06/2020   [facility] echo shows moderate-severe MR, moderate to severe TR with RVP estimated 95 mmHg.   Stenosis of left subclavian artery ([place]) 10/27/2010   ~75% by recent doppler -- CHECK BP ON R ARM (or bilaterally for confirmation)   September 2016: Suffered a fall.  CTA of the head showed 1.2 x 1.4 cm right thalamic and posterior limb of internal capsule hemorrhage.  PAST SURGICAL HISTORY    Past Surgical History:  Procedure Laterality Date   APPENDECTOMY     COLONOSCOPY WITH PROPOFOL N/A 06/09/2017   Procedure: COLONOSCOPY WITH PROPOFOL;  Surgeon: Lucilla Lame, MD;  Location: [facility] ENDOSCOPY;  Service: Endoscopy;  Laterality: N/A;   COLONOSCOPY WITH PROPOFOL N/A 10/08/2019   Procedure: COLONOSCOPY WITH PROPOFOL;  Surgeon: Lucilla Lame, MD;  Location: [facility] ENDOSCOPY;  Service: Endoscopy;  Laterality: N/A;   ENTEROSCOPY N/A 10/08/2019   Procedure: ENTEROSCOPY;  Surgeon: Lucilla Lame, MD;  Location: [facility] ENDOSCOPY;  Service: Endoscopy;  Laterality: N/A;   ENTEROSCOPY N/A 07/18/2020   Procedure: ENTEROSCOPY;  Surgeon: Lin Landsman, MD;  Location: [facility] ENDOSCOPY;  Service: Gastroenterology;  Laterality: N/A;   ESOPHAGOGASTRODUODENOSCOPY N/A 07/30/2017   Procedure: ESOPHAGOGASTRODUODENOSCOPY (EGD);  Surgeon: Lin Landsman, MD;  Location: [facility] ENDOSCOPY;  Service: Gastroenterology;  Laterality: N/A;   ESOPHAGOGASTRODUODENOSCOPY (EGD) WITH PROPOFOL N/A 04/09/2017   Procedure: ESOPHAGOGASTRODUODENOSCOPY (EGD) WITH PROPOFOL;  Surgeon: Lucilla Lame, MD;  Location: [facility] ENDOSCOPY;  Service: Endoscopy;  Laterality: N/A;   GIVENS CAPSULE STUDY N/A 10/08/2019   Procedure: GIVENS CAPSULE STUDY;  Surgeon: Lucilla Lame, MD;  Location: [facility] ENDOSCOPY;  Service: Endoscopy;  Laterality: N/A;   HIP FRACTURE SURGERY     LEFT HEART CATH AND CORONARY ANGIOGRAPHY  10/2009   [facility]: EF 85%;  40% prox &mid RCA ->  80% RPDA and AMrg, 60% RPAV.  40% prox & distal LAD w. 60% dLAD and D2, - Med Rx   LOWER EXTREMITY ARTERIOGRAM Bilateral 03/28/2011   ([facility]) RLE: moderate focal prox. R> CIA stenosis, patent PFA, SFA, CFA, 2 vessel runoff via PT/peroneal, AT proximally occluded. LLE: moderate CFA stenosis, patent PFA, SFA and PA, 2 vessel runoff via AT/peroneal, PT occluded.   NM GATED MYOVIEW ([place] [facility])  08/2018   Small sized mild severity defect in the apical anterior wall concerning for  ischemia.  INTERMEDIATE RISK. (Appears to be no change from 2009)   NM MYOVIEW LTD  07/2008   ([facility]): A) 07/2008: Minimal reversible defect in the apex with possible ischemia.  Likely represents artifact.  EF 59% -> Cath: 80% PDA, 60% PAV and distal LAD dZ); B) 08/2010: Lexiscan - > ischemia the Ant & Inf Apex.  EF 64%.(Med Rx); C) Jan 2013: + Apical Ischemia - unchanged; D) Jan 2016: EF 76%. No Ischemia /Infarct.   TRANSTHORACIC ECHOCARDIOGRAM  11/2009   ([place]) A) December 2010, 2D echo: EF >55%, mild concentric LVH, diastolic dysfunction Gr. I, LVIDd 4.2 cm, LVIDs 2.3 cm, mild TR (peak RVP 28 mmHg).; B) November 2011, 2D echo: EF >55%, mild concentric LVH, diastolic dysfunction Gr. I, LVIDd 4.3 cm, LVIDs 1.7 cm, mild PR (peak RVP 25 mmHg).; C) 12/2014 Echo: EF >55%, mild LVH, DD gr. I, LAE 3.9cm, Moderate TR (PRVP 35 mmHg)   TRANSTHORACIC ECHOCARDIOGRAM  06/13/2020   [facility]: EF 60-65%.  Normal LV function.  Indeterminate diastolic pressures.  Mild RV enlargement with severely elevated PAP estimated 95. moderate biatrial dilation.  Moderate to severe MR.  Moderate to severe TR.  Mild AS.    Immunization History  Administered Date(s) Administered   Influenza, Seasonal, Injecte, Preservative Fre 10/16/2006   Influenza,inj,Quad PF,6+ Mos 01/24/2015, 09/13/2015, 09/15/2016, 11/20/2018   Pneumococcal Polysaccharide-23 10/15/2011, 05/23/2015, 10/09/2019   Tdap 11/28/2014, 01/24/2015, 01/05/2016    MEDICATIONS/ALLERGIES   Current Meds  Medication Sig   albuterol (PROVENTIL) (2.5 MG/3ML) 0.083% nebulizer solution Take 3 mLs (2.5 mg total) by nebulization every 6 (six) hours as needed for wheezing or shortness of breath.   alprazolam (XANAX) 2 MG tablet Take 2 mg by mouth 2 (two) times daily.    atorvastatin (LIPITOR) 80 MG tablet Take 80 mg by mouth daily.   busPIRone (BUSPAR) 10 MG tablet Take 10 mg by mouth 2 (two) times daily.    carvedilol (COREG) 6.25 MG tablet  Take 6.25 mg by mouth 2 (two) times daily.   citalopram (CELEXA) 40 MG tablet Take 40 mg by mouth daily.   DULoxetine (CYMBALTA) 60 MG capsule Take 60 mg by mouth daily.   ezetimibe (ZETIA) 10 MG tablet Take 1 tablet (10 mg total) by mouth daily.   ferrous gluconate (FERGON) 324 MG tablet Take 1 tablet (324 mg total) by mouth daily with breakfast.   furosemide (LASIX) 40 MG tablet Take 1 tablet (40 mg total) by mouth daily.   gabapentin (NEURONTIN) 400 MG capsule Take 400 mg by mouth 3 (three) times daily.   ipratropium-albuterol (DUONEB) 0.5-2.5 (3) MG/3ML SOLN Take 3 mLs by nebulization every 6 (six) hours as needed.   isosorbide mononitrate (IMDUR) 30 MG 24 hr tablet Take 1 tablet (30 mg total) by mouth daily.   lisinopril (ZESTRIL) 10 MG tablet Take 1 tablet (10 mg total) by mouth daily.   nitroGLYCERIN (NITROSTAT) 0.4 MG SL tablet Place 0.4 mg under the tongue as needed.  pantoprazole (PROTONIX) 20 MG tablet Take 20 mg by mouth daily.   SYMBICORT 80-4.5 MCG/ACT inhaler Inhale 2 puffs into the lungs 2 (two) times daily.   [DISCONTINUED] lisinopril (ZESTRIL) 20 MG tablet Take 20 mg by mouth daily.    Allergies  Allergen Reactions   Chantix [Varenicline] Hives, Shortness Of Breath and Palpitations   Diphenhydramine Hcl Shortness Of Breath   Potassium Itching and Swelling   Benadryl [Diphenhydramine Hcl] Rash   Niacin Rash   Trazodone Palpitations    SOCIAL HISTORY/FAMILY HISTORY   Reviewed in Epic:  Pertinent findings:  Social History   Tobacco Use   Smoking status: Some Days    Packs/day: 1.00    Years: 46.00    Pack years: 46.00    Types: Cigarettes   Smokeless tobacco: Never   Tobacco comments:    she feels that the 21 nicotine made her jittery and we suggested a lower level patch  Vaping Use   Vaping Use: Never used  Substance Use Topics   Alcohol use: No   Drug use: Yes    Types: Marijuana    Comment: monthly   Social History   Social History Narrative    She is a widowed mother of at least 3 daughters.  Unfortunately all 3 daughters have deceased.        01-14-14: Her youngest daughter and son-in-law were run over by an [place] train   01-14-17: Middle daughter died of complications of DKA   Apr 14, 2021: Oldest daughter died cancer after being on palliative care.   She also had a dog that died after being with her for 18 years.  Mother had an MI at age 58, father had an MI at age 21.  OBJCTIVE -PE, EKG, labs   Wt Readings from Last 3 Encounters:  09/26/21 142 lb (64.4 kg)  06/17/21 143 lb 3 oz (64.9 kg)  02/19/21 144 lb 12.8 oz (65.7 kg)    Physical Exam: BP 90/68 (BP Location: Left Arm, Patient Position: Sitting, Cuff Size: Normal)   Pulse 68   Ht 5' 7" (1.702 m)   Wt 142 lb (64.4 kg)   SpO2 98%   BMI 22.24 kg/m  Physical Exam Vitals reviewed.  Constitutional:      General: She is not in acute distress.    Appearance: She is normal weight. She is not ill-appearing (Mild chronically ill-appearing, but nontoxic.) or diaphoretic.  HENT:     Head: Normocephalic and atraumatic.  Neck:     Vascular: Carotid bruit (Bilateral carotid bruits and left subclavian berry) present. No JVD.  Cardiovascular:     Rate and Rhythm: Normal rate and regular rhythm. Occasional Extrasystoles are present.    Chest Wall: PMI is not displaced.     Pulses: Decreased pulses.          Carotid pulses are  on the right side with bruit and  on the left side with bruit.      Radial pulses are 2+ on the right side and 1+ on the left side.       Femoral pulses are  on the right side with bruit and  on the left side with bruit.      Dorsalis pedis pulses are 0 on the right side and 0 on the left side.       Posterior tibial pulses are 1+ on the right side and 1+ on the left side.     Heart sounds: S1 normal and S2 normal. Heart  sounds are distant. Murmur heard.  Harsh crescendo-decrescendo early systolic murmur is present with a grade of 1/6 at the upper right sternal  border radiating to the neck.  High-pitched blowing holosystolic murmur of grade 2/6 is also present at the apex radiating to the axilla.    No friction rub. No gallop.     Comments: Left subclavian bruit heard as well. Pulmonary:     Effort: Pulmonary effort is normal. No respiratory distress.     Breath sounds: Normal breath sounds. No rhonchi.  Chest:     Chest wall: Tenderness present.  Abdominal:     General: Abdomen is flat. Bowel sounds are normal. There is no distension.     Palpations: Abdomen is soft. There is no mass.  Musculoskeletal:        General: No swelling. Normal range of motion.     Cervical back: Normal range of motion and neck supple.  Skin:    General: Skin is warm and dry.  Neurological:     General: No focal deficit present.     Mental Status: She is alert and oriented to person, place, and time.     Gait: Gait normal.  Psychiatric:     Comments: Somewhat sad, depressed mood.  Poor historian.  Memory loss.  Does not remember being here last year.     Adult ECG Report  Rate: 68;  Rhythm: normal sinus rhythm and normal axis, intervals and durations. ;   Narrative Interpretation: Relatively normal EKG.  Recent Labs:   [place] Related to Complete Blood Count (CBC) with Differential Component 06/06/21 11/18/18  WBC (White Blood Cell Count) 8.7 10.5 High   Hemoglobin 10.0 Low  7.3 Low    Hematocrit 31.9 Low  27.7 Low   Platelets 296 364  MCV (Mean Corpuscular Volume) 80 73 Low   MCH (Mean Corpuscular Hemoglobin) 25.1 Low  19.3 Low   MCHC (Mean Corpuscular Hemoglobin Concentration) 31.3 Low  26.4 Low   RBC (Red Blood Cell Count) 3.98 3.79  RDW-CV (Red Cell Distribution Width) 15.5 High  21.1 High    Comprehensive Metabolic Panel (CMP) Component 06/06/21  11/06/20   Sodium 137 137  Potassium 4.3 3.5  Chloride 102 102  Carbon Dioxide (CO2) 26 24  Urea Nitrogen (BUN) 10 10  Creatinine 1.5 High  1.0  Glucose 76  107   Calcium  9.4 9.4  AST (Aspartate Aminotransferase) 22 30  ALT (Alanine Aminotransferase) 15 29  Bilirubin, Total 0.6 0.6  Alk Phos (Alkaline Phosphatase) 97 86  Albumin 3.4 Low  3.8  Protein, Total 6.4 6.7     Ref Range & Units 3 mo ago  Pro-Brain Natriuretic Peptide, N-terminal (NT-Pro-BNP) <=225 pg/mL 714 High      Lipid Panel W/Calculated Low Density Lipoprotein (LDL) Cholesterol Component 11/06/20  10/06/17   Cholesterol, Total 183  193   LDL Calculated 103  107   HDL 32  36   Triglyceride 241  248     CBC Latest Ref Rng & Units 06/17/2021 02/19/2021 10/09/2020  WBC 4.0 - 10.5 K/uL 8.1 8.6 7.7  Hemoglobin 12.0 - 15.0 g/dL 10.7(L) 12.0 12.6  Hematocrit 36.0 - 46.0 % 33.9(L) 36.4 37.2  Platelets 150 - 400 K/uL 292 268 230    Lab Results  Component Value Date   HGBA1C 5.2 10/14/2011   Lab Results  Component Value Date   TSH 1.669 09/03/2018    ==================================================  COVID-19 Education: The signs and symptoms of  COVID-19 were discussed with the patient and how to seek care for testing (follow up with PCP or arrange E-visit).    I spent a total of 55 minutes with the patient spent in direct patient consultation.  Additional time spent with chart review  / charting (studies, outside notes, etc): 60 min very extensive chart review.  Multiple data studies reviewed.  Past Medical/Surgical As Well As Problem List History updated. Total Time: 115 min  Current medicines are reviewed at length with the patient today.  (+/- concerns) n/a  This visit occurred during the SARS-CoV-2 public health emergency.  Safety protocols were in place, including screening questions prior to the visit, additional usage of staff PPE, and extensive cleaning of exam room while observing appropriate contact time as indicated for disinfecting solutions.  Notice: This dictation was prepared with Dragon dictation along with smart phrase technology. Any transcriptional errors that  result from this process are unintentional and may not be corrected upon review.  Patient Instructions / Medication Changes & Studies & Tests Ordered   Patient Instructions  Medication Instructions:  - Your physician has recommended you make the following change in your medication:   1) START Zetia (ezetimibe) 10 mg- take 1 tablet by mouth once daily   2) DECREASE Lisinopril to 10 mg- take 1 tablet by mouth once daily     *If you need a refill on your cardiac medications before your next appointment, please call your pharmacy*   Lab Work: - none ordered  If you have labs (blood work) drawn today and your tests are completely normal, you will receive your results only by: [place] (if you have MyChart) OR A paper copy in the mail If you have any lab test that is abnormal or we need to change your treatment, we will call you to review the results.   Testing/Procedures: - Your physician has requested that you have an echocardiogram. Echocardiography is a painless test that uses sound waves to create images of your heart. It provides your doctor with information about the size and shape of your heart and how well your heart's [place] and valves are working. This procedure takes approximately one hour. There are no restrictions for this procedure.   2) You have been referred to : Dr. Vernard Gambles- [facility] - their office will call you directly to schedule  3) You have been referred to : Dr. Hortencia Pilar - [facility] - their office will call you directly to schedule    Follow-Up: At [facility], you and your health needs are our priority.  As part of our continuing mission to provide you with exceptional heart care, we have created designated Provider Care Teams.  These Care Teams include your primary Cardiologist (physician) and Advanced Practice Providers (APPs -  Physician Assistants and Nurse Practitioners) who all work together to provide  you with the care you need, when you need it.  We recommend signing up for the patient portal called "MyChart".  Sign up information is provided on this After Visit Summary.  MyChart is used to connect with patients for Virtual Visits (Telemedicine).  Patients are able to view lab/test results, encounter notes, upcoming appointments, etc.  Non-urgent messages can be sent to your provider as well.   To learn more about what you can do with MyChart, go to [email or web address].    Your next appointment:   3 month(s)  The format for your next appointment:   In Person  Provider:  You may see [name], MD or one of the following Advanced Practice Providers on your designated Care Team:   Murray Hodgkins, NP Christell Faith, PA-C Marrianne Mood, PA-C Cadence Kathlen Mody, [place]   Other Instructions  Ezetimibe Tablets What is this medication? EZETIMIBE (ez ET i mibe) treats high cholesterol. It works by reducing the amount of cholesterol absorbed from the food you eat. This decreases the amount of bad cholesterol (such as LDL) in your blood. Changes to diet and exercise are often combined with this medication. This medicine may be used for other purposes; ask your health care provider or pharmacist if you have questions. COMMON BRAND NAME(S): Zetia What should I tell my care team before I take this medication? They need to know if you have any of these conditions: Kidney disease Liver disease Muscle cramps, pain Muscle injury Thyroid disease An unusual or allergic reaction to ezetimibe, other medications, foods, dyes, or preservatives Pregnant or trying to get pregnant Breast-feeding How should I use this medication? Take this medication by mouth. Take it as directed on the prescription label at the same time every day. You can take it with or without food. If it upsets your stomach, take it with food. Keep taking it unless your care team tells you to stop. Take bile acid sequestrants at  a different time of day than this medication. Take this medication 2 hours BEFORE or 4 hours AFTER bile acid sequestrants. Talk to your care team about the use of this medication in children. While it may be prescribed for children as young as 10 for selected conditions, precautions do apply. Overdosage: If you think you have taken too much of this medicine contact a poison control center or emergency room at once. NOTE: This medicine is only for you. Do not share this medicine with others. What if I miss a dose? If you miss a dose, take it as soon as you can. If it is almost time for your next dose, take only that dose. Do not take double or extra doses. What may interact with this medication? Do not take this medication with any of the following: Fenofibrate Gemfibrozil This medication may also interact with the following: Antacids Cyclosporine Herbal medications like red yeast rice Other medications to lower cholesterol or triglycerides This list may not describe all possible interactions. Give your health care provider a list of all the medicines, herbs, non-prescription drugs, or dietary supplements you use. Also tell them if you smoke, drink alcohol, or use illegal drugs. Some items may interact with your medicine. What should I watch for while using this medication? Visit your care team for regular checks on your progress. Tell your care team if your symptoms do not start to get better or if they get worse. Your care team may tell you to stop taking this medication if you develop muscle problems. If your muscle problems do not go away after stopping this medication, contact your care team. Do not become pregnant while taking this medication. Women should inform their care team if they wish to become pregnant or think they might be pregnant. There is potential for serious harm to an unborn child. Talk to your care team for more information. Do not breast-feed an infant while taking this  medication. Taking this medication is only part of a total heart healthy program. Your care team may give you a special diet to follow. Avoid alcohol. Avoid smoking. Ask your care team how much you should exercise. What side effects  may I notice from receiving this medication? Side effects that you should report to your doctor or health care provider as soon as possible: Allergic reactions-skin rash, itching or hives, swelling of the face, lips, tongue, or throat Side effects that usually do not require medical attention (report to your doctor or health care provider if they continue or are bothersome): Diarrhea Joint pain This list may not describe all possible side effects. Call your doctor for medical advice about side effects. You may report side effects to FDA at 1-800-FDA-1088. Where should I keep my medication? Keep out of the reach of children and pets. Store at room temperature between 15 and 30 degrees C (59 and 86 degrees F). Protect from moisture. Get rid of any unused medication after the expiration date. NOTE: This sheet is a summary. It may not cover all possible information. If you have questions about this medicine, talk to your doctor, pharmacist, or health care provider.  2022 Elsevier/Gold Standard (2020-12-21 12:29:13)   Echocardiogram An echocardiogram is a test that uses sound waves (ultrasound) to produce images of the heart. Images from an echocardiogram can provide important information about: Heart size and shape. The size and thickness and movement of your heart's walls. Heart muscle function and strength. Heart valve function or if you have stenosis. Stenosis is when the heart valves are too narrow. If blood is flowing backward through the heart valves (regurgitation). A tumor or infectious growth around the heart valves. Areas of heart muscle that are not working well because of poor blood flow or injury from a heart attack. Aneurysm detection. An aneurysm is  a weak or damaged part of an artery wall. The wall bulges out from the normal force of blood pumping through the body. Tell a health care provider about: Any allergies you have. All medicines you are taking, including vitamins, herbs, eye drops, creams, and over-the-counter medicines. Any blood disorders you have. Any surgeries you have had. Any medical conditions you have. Whether you are pregnant or may be pregnant. What are the risks? Generally, this is a safe test. However, problems may occur, including an allergic reaction to dye (contrast) that may be used during the test. What happens before the test? No specific preparation is needed. You may eat and drink normally. What happens during the test?  You will take off your clothes from the waist up and put on a hospital gown. Electrodes or electrocardiogram (ECG)patches may be placed on your chest. The electrodes or patches are then connected to a device that monitors your heart rate and rhythm. You will lie down on a table for an ultrasound exam. A gel will be applied to your chest to help sound waves pass through your skin. A handheld device, called a transducer, will be pressed against your chest and moved over your heart. The transducer produces sound waves that travel to your heart and bounce back (or "echo" back) to the transducer. These sound waves will be captured in real-time and changed into images of your heart that can be viewed on a video monitor. The images will be recorded on a computer and reviewed by your health care provider. You may be asked to change positions or hold your breath for a short time. This makes it easier to get different views or better views of your heart. In some cases, you may receive contrast through an IV in one of your veins. This can improve the quality of the pictures from your heart. The procedure may  vary among health care providers and hospitals. What can I expect after the test? You may return  to your normal, everyday life, including diet, activities, and medicines, unless your health care provider tells you not to do that. Follow these instructions at home: It is up to you to get the results of your test. Ask your health care provider, or the department that is doing the test, when your results will be ready. Keep all follow-up visits. This is important. Summary An echocardiogram is a test that uses sound waves (ultrasound) to produce images of the heart. Images from an echocardiogram can provide important information about the size and shape of your heart, heart muscle function, heart valve function, and other possible heart problems. You do not need to do anything to prepare before this test. You may eat and drink normally. After the echocardiogram is completed, you may return to your normal, everyday life, unless your health care provider tells you not to do that. This information is not intended to replace advice given to you by your health care provider. Make sure you discuss any questions you have with your health care provider. Document Revised: 07/17/2020 Document Reviewed: 07/17/2020 Elsevier Patient Education  [employer].   Studies Ordered:   Orders Placed This Encounter  Procedures   Ambulatory referral to Pulmonology   Ambulatory referral to Vascular Surgery   EKG 12-Lead   ECHOCARDIOGRAM COMPLETE     [name], M.D., M.S. Interventional Cardiologist   Pager # [phone] Phone # [phone] [address] [place], [place] [address]   Thank you for choosing [facility] at [facility]!!

## 2021-09-26 NOTE — Assessment & Plan Note (Addendum)
Not really having any heart failure symptoms.  She is on a standing dose of Lasix and relatively euvolemic.  Edema well controlled.  She does note that if she does not take it she will get edema.  She is also on modest dose of Coreg but high-dose lisinopril.  ->  For now we will reduce lisinopril down to 10 mg daily.  Based on her hypotension the day on the reduce it since he is dizzy but may need to continue to monitor pressures from the right arm for more accurate measurements.

## 2021-09-26 NOTE — Assessment & Plan Note (Signed)
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  She had a longstanding Myoview that is abnormal showing apical inferior defect is mostly fixed partially reversible.  This probably goes along with known anatomy.  I doubt that it is any better than it was in 2010/2011.  With no active exertional angina at this point, would not recommend any further ischemic evaluation since her stress tests have not changed.  Plan:  Continue high-dose atorvastatin, but likely need to titrate her medications more by adding Zetia for now.  Suspect if she may require CVRR evaluation for possible PCSK9 inhibitor versus inclisiran.  At a minimum should be on aspirin  Is on beta-blocker and ACE-I although I am going to reduce lisinopril by to 10 mg atenolol daily.  On Imdur that was started last year.  She is not complaining of any chest discomfort at this point.  As such, we will continue.

## 2021-09-26 NOTE — Assessment & Plan Note (Addendum)
Extensive PAD history with claudication limits her activity.  We will likely add Pletal when I see her back. Aggressive atherosclerotic risk factor modification: High-dose statin but lipids not at goal.  Blood pressure actually low, need to back off.  Continue to encourage walking.Marland Kitchen  Refer back to vascular surgery

## 2021-09-26 NOTE — Assessment & Plan Note (Signed)
Most recently evaluated by [facility]-Dr Schneier.  That we will refer back to to ensure that she stays active. Recently had carotid Dopplers checked. She does note that her left hand gets cold but does not necessarily note a true claudication symptoms.

## 2021-09-26 NOTE — Assessment & Plan Note (Signed)
Most recent LDL was not at goal.  For as much vascular disease that she has, her LDL target should be less than 70 if not more so.  She is already on 62 of Lipitor so I suspect that we may need to be more aggressive with injection medications.  For now, will start Zetia 10 mg and reevaluate in roughly 3 months.

## 2021-09-26 NOTE — Assessment & Plan Note (Addendum)
Slow progression of mitral irritation based on reports of echocardiograms.  Unfortunately I will have a 1 local echo to look at.  Since there was progression of disease, we will recheck an echocardiogram this year to assess EF as well as mitral and aortic valve disease.  I do not think she is at a stage yet we will consider more aggressive evaluation, testing to continue to monitor.  Continue blood pressure control but avoid hypotension.  Follow-up 2D echocardiogram

## 2021-09-26 NOTE — Patient Instructions (Addendum)
Medication Instructions:  - Your physician has recommended you make the following change in your medication:   1) START Zetia (ezetimibe) 10 mg- take 1 tablet by mouth once daily   2) DECREASE Lisinopril to 10 mg- take 1 tablet by mouth once daily     *If you need a refill on your cardiac medications before your next appointment, please call your pharmacy*   Lab Work: - none ordered  If you have labs (blood work) drawn today and your tests are completely normal, you will receive your results only by: [place] (if you have MyChart) OR A paper copy in the mail If you have any lab test that is abnormal or we need to change your treatment, we will call you to review the results.   Testing/Procedures: - Your physician has requested that you have an echocardiogram. Echocardiography is a painless test that uses sound waves to create images of your heart. It provides your doctor with information about the size and shape of your heart and how well your heart's [place] and valves are working. This procedure takes approximately one hour. There are no restrictions for this procedure.   2) You have been referred to : Dr. Vernard Gambles- [facility] - their office will call you directly to schedule  3) You have been referred to : Dr. Hortencia Pilar - [facility] - their office will call you directly to schedule    Follow-Up: At [facility], you and your health needs are our priority.  As part of our continuing mission to provide you with exceptional heart care, we have created designated Provider Care Teams.  These Care Teams include your primary Cardiologist (physician) and Advanced Practice Providers (APPs -  Physician Assistants and Nurse Practitioners) who all work together to provide you with the care you need, when you need it.  We recommend signing up for the patient portal called "MyChart".  Sign up information is provided on this After Visit Summary.   MyChart is used to connect with patients for Virtual Visits (Telemedicine).  Patients are able to view lab/test results, encounter notes, upcoming appointments, etc.  Non-urgent messages can be sent to your provider as well.   To learn more about what you can do with MyChart, go to [email or web address].    Your next appointment:   3 month(s)  The format for your next appointment:   In Person  Provider:   You may see [name], MD or one of the following Advanced Practice Providers on your designated Care Team:   Murray Hodgkins, NP Christell Faith, PA-C Marrianne Mood, PA-C Cadence Kathlen Mody, [place]   Other Instructions  Ezetimibe Tablets What is this medication? EZETIMIBE (ez ET i mibe) treats high cholesterol. It works by reducing the amount of cholesterol absorbed from the food you eat. This decreases the amount of bad cholesterol (such as LDL) in your blood. Changes to diet and exercise are often combined with this medication. This medicine may be used for other purposes; ask your health care provider or pharmacist if you have questions. COMMON BRAND NAME(S): Zetia What should I tell my care team before I take this medication? They need to know if you have any of these conditions: Kidney disease Liver disease Muscle cramps, pain Muscle injury Thyroid disease An unusual or allergic reaction to ezetimibe, other medications, foods, dyes, or preservatives Pregnant or trying to get pregnant Breast-feeding How should I use this medication? Take this medication by mouth. Take it as directed  on the prescription label at the same time every day. You can take it with or without food. If it upsets your stomach, take it with food. Keep taking it unless your care team tells you to stop. Take bile acid sequestrants at a different time of day than this medication. Take this medication 2 hours BEFORE or 4 hours AFTER bile acid sequestrants. Talk to your care team about the use of this  medication in children. While it may be prescribed for children as young as 10 for selected conditions, precautions do apply. Overdosage: If you think you have taken too much of this medicine contact a poison control center or emergency room at once. NOTE: This medicine is only for you. Do not share this medicine with others. What if I miss a dose? If you miss a dose, take it as soon as you can. If it is almost time for your next dose, take only that dose. Do not take double or extra doses. What may interact with this medication? Do not take this medication with any of the following: Fenofibrate Gemfibrozil This medication may also interact with the following: Antacids Cyclosporine Herbal medications like red yeast rice Other medications to lower cholesterol or triglycerides This list may not describe all possible interactions. Give your health care provider a list of all the medicines, herbs, non-prescription drugs, or dietary supplements you use. Also tell them if you smoke, drink alcohol, or use illegal drugs. Some items may interact with your medicine. What should I watch for while using this medication? Visit your care team for regular checks on your progress. Tell your care team if your symptoms do not start to get better or if they get worse. Your care team may tell you to stop taking this medication if you develop muscle problems. If your muscle problems do not go away after stopping this medication, contact your care team. Do not become pregnant while taking this medication. Women should inform their care team if they wish to become pregnant or think they might be pregnant. There is potential for serious harm to an unborn child. Talk to your care team for more information. Do not breast-feed an infant while taking this medication. Taking this medication is only part of a total heart healthy program. Your care team may give you a special diet to follow. Avoid alcohol. Avoid smoking. Ask  your care team how much you should exercise. What side effects may I notice from receiving this medication? Side effects that you should report to your doctor or health care provider as soon as possible: Allergic reactions-skin rash, itching or hives, swelling of the face, lips, tongue, or throat Side effects that usually do not require medical attention (report to your doctor or health care provider if they continue or are bothersome): Diarrhea Joint pain This list may not describe all possible side effects. Call your doctor for medical advice about side effects. You may report side effects to FDA at 1-800-FDA-1088. Where should I keep my medication? Keep out of the reach of children and pets. Store at room temperature between 15 and 30 degrees C (59 and 86 degrees F). Protect from moisture. Get rid of any unused medication after the expiration date. NOTE: This sheet is a summary. It may not cover all possible information. If you have questions about this medicine, talk to your doctor, pharmacist, or health care provider.  2022 Elsevier/Gold Standard (2020-12-21 12:29:13)   Echocardiogram An echocardiogram is a test that uses sound waves (ultrasound)  to produce images of the heart. Images from an echocardiogram can provide important information about: Heart size and shape. The size and thickness and movement of your heart's walls. Heart muscle function and strength. Heart valve function or if you have stenosis. Stenosis is when the heart valves are too narrow. If blood is flowing backward through the heart valves (regurgitation). A tumor or infectious growth around the heart valves. Areas of heart muscle that are not working well because of poor blood flow or injury from a heart attack. Aneurysm detection. An aneurysm is a weak or damaged part of an artery wall. The wall bulges out from the normal force of blood pumping through the body. Tell a health care provider about: Any allergies  you have. All medicines you are taking, including vitamins, herbs, eye drops, creams, and over-the-counter medicines. Any blood disorders you have. Any surgeries you have had. Any medical conditions you have. Whether you are pregnant or may be pregnant. What are the risks? Generally, this is a safe test. However, problems may occur, including an allergic reaction to dye (contrast) that may be used during the test. What happens before the test? No specific preparation is needed. You may eat and drink normally. What happens during the test?  You will take off your clothes from the waist up and put on a hospital gown. Electrodes or electrocardiogram (ECG)patches may be placed on your chest. The electrodes or patches are then connected to a device that monitors your heart rate and rhythm. You will lie down on a table for an ultrasound exam. A gel will be applied to your chest to help sound waves pass through your skin. A handheld device, called a transducer, will be pressed against your chest and moved over your heart. The transducer produces sound waves that travel to your heart and bounce back (or "echo" back) to the transducer. These sound waves will be captured in real-time and changed into images of your heart that can be viewed on a video monitor. The images will be recorded on a computer and reviewed by your health care provider. You may be asked to change positions or hold your breath for a short time. This makes it easier to get different views or better views of your heart. In some cases, you may receive contrast through an IV in one of your veins. This can improve the quality of the pictures from your heart. The procedure may vary among health care providers and hospitals. What can I expect after the test? You may return to your normal, everyday life, including diet, activities, and medicines, unless your health care provider tells you not to do that. Follow these instructions at  home: It is up to you to get the results of your test. Ask your health care provider, or the department that is doing the test, when your results will be ready. Keep all follow-up visits. This is important. Summary An echocardiogram is a test that uses sound waves (ultrasound) to produce images of the heart. Images from an echocardiogram can provide important information about the size and shape of your heart, heart muscle function, heart valve function, and other possible heart problems. You do not need to do anything to prepare before this test. You may eat and drink normally. After the echocardiogram is completed, you may return to your normal, everyday life, unless your health care provider tells you not to do that. This information is not intended to replace advice given to you by your health  care provider. Make sure you discuss any questions you have with your health care provider. Document Revised: 07/17/2020 Document Reviewed: 07/17/2020 Elsevier Patient Education  2022 [employer].

## 2021-09-26 NOTE — Assessment & Plan Note (Signed)
Most recent hemoglobin was over 10.  Has not been on even aspirin because of prior bleed.  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

## 2021-09-26 NOTE — Assessment & Plan Note (Deleted)
Most recent hemoglobin was over 10.  Has not been on even aspirin because of prior bleed.  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

## 2021-09-26 NOTE — Assessment & Plan Note (Signed)
Not hypotensive today, infectious hypotensive and dizzy.  Reduce lisinopril dose to 10 mg.  Continue to make sure that she does not take all medications when her blood pressures are low.  NEED TO ENSURE THAT ALL BLOOD PRESSURE RECORDINGS ARE DONE ON THE RIGHT ARM AS OPPOSED TO LEFT ARM.!!!!

## 2021-10-07 ENCOUNTER — Ambulatory Visit: Payer: Medicare Other

## 2021-10-07 ENCOUNTER — Ambulatory Visit: Payer: Medicare Other | Admitting: Oncology

## 2021-10-07 ENCOUNTER — Other Ambulatory Visit: Payer: Medicare Other

## 2021-10-28 ENCOUNTER — Other Ambulatory Visit: Payer: Self-pay | Admitting: *Deleted

## 2021-10-28 DIAGNOSIS — D500 Iron deficiency anemia secondary to blood loss (chronic): Secondary | ICD-10-CM

## 2021-10-28 DIAGNOSIS — E538 Deficiency of other specified B group vitamins: Secondary | ICD-10-CM

## 2021-10-29 ENCOUNTER — Inpatient Hospital Stay: Payer: Medicare Other | Attending: Oncology | Admitting: Oncology

## 2021-10-29 ENCOUNTER — Inpatient Hospital Stay: Payer: Medicare Other

## 2021-10-29 ENCOUNTER — Telehealth: Payer: Self-pay | Admitting: Oncology

## 2021-10-29 ENCOUNTER — Other Ambulatory Visit: Payer: Self-pay | Admitting: *Deleted

## 2021-10-29 DIAGNOSIS — D500 Iron deficiency anemia secondary to blood loss (chronic): Secondary | ICD-10-CM

## 2021-10-29 DIAGNOSIS — E538 Deficiency of other specified B group vitamins: Secondary | ICD-10-CM

## 2021-10-29 NOTE — Telephone Encounter (Signed)
Left VM with patient to reschedule her missed appointment today.

## 2021-11-05 ENCOUNTER — Other Ambulatory Visit: Payer: Self-pay

## 2021-11-05 ENCOUNTER — Ambulatory Visit (INDEPENDENT_AMBULATORY_CARE_PROVIDER_SITE_OTHER): Payer: Medicare Other

## 2021-11-05 DIAGNOSIS — I340 Nonrheumatic mitral (valve) insufficiency: Secondary | ICD-10-CM | POA: Diagnosis not present

## 2021-11-05 DIAGNOSIS — I272 Pulmonary hypertension, unspecified: Secondary | ICD-10-CM

## 2021-11-05 LAB — ECHOCARDIOGRAM COMPLETE
AR max vel: 1.58 cm2
AV Area VTI: 1.78 cm2
AV Area mean vel: 1.78 cm2
AV Mean grad: 7 mmHg
AV Peak grad: 15.2 mmHg
Ao pk vel: 1.95 m/s
Area-P 1/2: 2.11 cm2
Calc EF: 49.8 %
MV VTI: 1.03 cm2
S' Lateral: 3 cm
Single Plane A2C EF: 44.5 %
Single Plane A4C EF: 54.5 %

## 2021-11-08 ENCOUNTER — Telehealth: Payer: Self-pay | Admitting: Cardiology

## 2021-11-08 NOTE — Telephone Encounter (Signed)
Called the patient to review echo results. Lmtcb.

## 2021-11-08 NOTE — Telephone Encounter (Signed)
Traci Man, MD  P Cv Div Burl Triage Transthoracic echocardiogram result:   Normal pump function with ejection fraction of 60 to 65%.  This is a measure of how much the heart pumps out compared to what it feels with -> normal range is 50 to 70%.)  The left ventricle has probably abnormal relaxation which leads to mild left atrial dilation.  Nothing significant.  Normal for age.  Right ventricle has mild decrease pressures but nothing significant => significantly improved from July.   There is moderate mitral valve regurgitation (back leaking) and mild mitral valve stenosis (Narrowing)-This can be followed annually-is also improved from July (at that time was moderate to severe mitral regurgitation)..   Aortic valve has mild stenosis/narrowing. ->  This is stable.  Can be monitored Every couple years.   Overall minor abnormalities => definitely there was improvement from the echocardiogram done back when you are sick in July.  Reassuring results.   [name], MD

## 2021-11-12 NOTE — Telephone Encounter (Signed)
Left voicemail message to call back for review of results.  

## 2021-11-15 NOTE — Telephone Encounter (Signed)
Emily Filbert, RN  11/11/2021  4:35 PM EST     The patient has been notified of the result and verbalized understanding.  All questions (if any) were answered. Alvis Lemmings, RN 11/11/2021 4:35 PM

## 2021-11-19 ENCOUNTER — Ambulatory Visit (INDEPENDENT_AMBULATORY_CARE_PROVIDER_SITE_OTHER): Payer: Medicare Other | Admitting: Pulmonary Disease

## 2021-11-19 ENCOUNTER — Encounter: Payer: Self-pay | Admitting: Pulmonary Disease

## 2021-11-19 ENCOUNTER — Other Ambulatory Visit: Payer: Self-pay

## 2021-11-19 VITALS — BP 102/64 | HR 61 | Temp 97.8°F | Ht 67.0 in | Wt 142.8 lb

## 2021-11-19 DIAGNOSIS — F17210 Nicotine dependence, cigarettes, uncomplicated: Secondary | ICD-10-CM

## 2021-11-19 DIAGNOSIS — J449 Chronic obstructive pulmonary disease, unspecified: Secondary | ICD-10-CM

## 2021-11-19 DIAGNOSIS — R0602 Shortness of breath: Secondary | ICD-10-CM | POA: Diagnosis not present

## 2021-11-19 DIAGNOSIS — I739 Peripheral vascular disease, unspecified: Secondary | ICD-10-CM | POA: Diagnosis not present

## 2021-11-19 DIAGNOSIS — I340 Nonrheumatic mitral (valve) insufficiency: Secondary | ICD-10-CM | POA: Diagnosis not present

## 2021-11-19 NOTE — Patient Instructions (Signed)
On your follow-up appointment with your primary care doctor please mention the issues you are having with your legs so that they can work this up for you.  We are ordering a breathing test.   Continue your inhalers as you are doing now.   We will see you in follow-up in 3 months time call sooner should any new problems arise.

## 2021-11-19 NOTE — Progress Notes (Signed)
Subjective:    Patient ID: Traci Duran, female    DOB: [birth year], 65 y.o.   MRN: [id number]  Chief Complaint  Patient presents with   Follow-up    C/o prod cough with clear sputum, sob with exertion and wheezing.     HPI Patient is a 65 year old current smoker who presents for follow-up on the issue of COPD.  She was previously seen here in August 2021.  She failed to follow through with PFTs requested and failed to follow through with follow-up visit.  She states that she does not remember the visit.  Presents  today complaining mostly of issues with pains in her legs.  She states that she came today so that something could be done about her legs.  She has not reached out to her primary care physician about this.  I advised her that I am a pulmonary physician.  She does not endorse much on the issue of shortness of breath though she does note this with exertion.  She occasionally has cough productive of clear sputum particularly in the mornings.  No hemoptysis.  She does not endorse any chest pain, orthopnea, paroxysmal nocturnal dyspnea nor lower extremity edema.  She does note however that if she does not take her standing Lasix she will get lower extremity edema.  She is not a very forthcoming historian.  Continues to perseverate on the issue of her lower extremity pain which has been present for months.  She has known issues with claudication that have been identified by cardiology, she also follows with Dr. Gilda Crease at the [facility].  She is with mitral regurgitation and stenosis.  She has chronic heart failure with preserved ejection fraction.  She is on Symbicort and as needed albuterol and feels that this helps her.  She has had a great deal of losses having lost 3 daughters and her beloved pet dog.   Review of Systems A 10 point review of systems was performed and it is as noted above otherwise negative.  Past Medical History:  Diagnosis Date   Anemia  05/2020   Thought to be related to GI bleed.   Anxiety    Bilateral carotid artery stenosis without cerebral infarction 10/2010    November 2011, CTA of the head and neck: Near complete occlusion versus severe stenosis of nearly the entire basilar artery, complete occlusion of the right vertebral artery from its origin off the right SCA, moderate noncalcified atherosclerotic in the proximal left SCA which results in a 40% stenosis. Right ICA with approximately 15% stenosis, left   Nov. 2012 Carotid Duplex: >75% diameter re   CHF (congestive heart failure), chronic HFrEF NYHA class II, chronic, diastolic (HCC)    Chronic HFrEF   COPD (chronic obstructive pulmonary disease) (HCC)    Coronary artery disease, non-occlusive 10/2009   Cardiac Cath: EF 85%;  40% prox &mid RCA -> 80% RPDA and AMrg, 60% RPAV.  40% prox & distal LAD w. 60% dLAD and D2, - Med Rx; Myoview 9/19: Small size, Mod severity Apical Anterior defect  c/w ISCHEMIA. -> INTERMEDIATE RISK (med Rx because of anemia, and known distal LAD and PDA disease. ->  Similar defect noted in August 2009)   Depression    Gastritis    GERD (gastroesophageal reflux disease)    Hemorrhoids    Hyperlipidemia    Hypertension    Mild aortic stenosis by prior echocardiogram 06/2020   Mild aortic stenosis by echo   Moderate  to severe mitral regurgitation 06/2020   Echo at [facility]: Moderate-severe MR with mild RV enlargement and severely elevated PA 95 mmHg.   Myocardial infarction [facility])    Demand ischemia infarction initially in 2010   PAD (peripheral artery disease) (HCC) 07/2008   a) 8/'09: normal ABIs; b) post procedure Dopplers December 2010: Suggestion of left PFA/CFA-SFV AV fistula.; c) 02/2010: Left CFA-PFA AV fistula still present.; d) 04/2010: CTA Abd/Pelvis- LE Runoff: Extensive Mixed plaque in the Abd Ao.  Patent visceral As.  Bilat 2V runnoff-feet ATA & Peroneal A;  (confirmed by Arteriogram 03/2011); e) ABIs October 2017 R ABI 0.88, L ABI 0.79.    Pulmonary hypertension (HCC) 06/2020   [facility] echo shows moderate-severe MR, moderate to severe TR with RVP estimated 95 mmHg.   Stenosis of left subclavian artery (HCC) 10/27/2010   ~75% by recent doppler -- CHECK BP ON R ARM (or bilaterally for confirmation)   Past Surgical History:  Procedure Laterality Date   APPENDECTOMY     COLONOSCOPY WITH PROPOFOL N/A 06/09/2017   Procedure: COLONOSCOPY WITH PROPOFOL;  Surgeon: Midge Minium, MD;  Location: [facility] ENDOSCOPY;  Service: Endoscopy;  Laterality: N/A;   COLONOSCOPY WITH PROPOFOL N/A 10/08/2019   Procedure: COLONOSCOPY WITH PROPOFOL;  Surgeon: Midge Minium, MD;  Location: [facility] ENDOSCOPY;  Service: Endoscopy;  Laterality: N/A;   ENTEROSCOPY N/A 10/08/2019   Procedure: ENTEROSCOPY;  Surgeon: Midge Minium, MD;  Location: [facility] ENDOSCOPY;  Service: Endoscopy;  Laterality: N/A;   ENTEROSCOPY N/A 07/18/2020   Procedure: ENTEROSCOPY;  Surgeon: Toney Reil, MD;  Location: [facility] ENDOSCOPY;  Service: Gastroenterology;  Laterality: N/A;   ESOPHAGOGASTRODUODENOSCOPY N/A 07/30/2017   Procedure: ESOPHAGOGASTRODUODENOSCOPY (EGD);  Surgeon: Toney Reil, MD;  Location: [facility] ENDOSCOPY;  Service: Gastroenterology;  Laterality: N/A;   ESOPHAGOGASTRODUODENOSCOPY (EGD) WITH PROPOFOL N/A 04/09/2017   Procedure: ESOPHAGOGASTRODUODENOSCOPY (EGD) WITH PROPOFOL;  Surgeon: Midge Minium, MD;  Location: [facility] ENDOSCOPY;  Service: Endoscopy;  Laterality: N/A;   GIVENS CAPSULE STUDY N/A 10/08/2019   Procedure: GIVENS CAPSULE STUDY;  Surgeon: Midge Minium, MD;  Location: [facility] ENDOSCOPY;  Service: Endoscopy;  Laterality: N/A;   HIP FRACTURE SURGERY     LEFT HEART CATH AND CORONARY ANGIOGRAPHY  10/2009   [facility]: EF 85%;  40% prox &mid RCA -> 80% RPDA and AMrg, 60% RPAV.  40% prox & distal LAD w. 60% dLAD and D2, - Med Rx   LOWER EXTREMITY ARTERIOGRAM Bilateral 03/28/2011   ([facility]) RLE: moderate focal prox. R> CIA stenosis, patent PFA, SFA, CFA, 2 vessel  runoff via PT/peroneal, AT proximally occluded. LLE: moderate CFA stenosis, patent PFA, SFA and PA, 2 vessel runoff via AT/peroneal, PT occluded.   NM GATED MYOVIEW ([facility] HX)  08/2018   Small sized mild severity defect in the apical anterior wall concerning for ischemia.  INTERMEDIATE RISK. (Appears to be no change from 2009)   NM MYOVIEW LTD  07/2008   ([facility]): A) 07/2008: Minimal reversible defect in the apex with possible ischemia.  Likely represents artifact.  EF 59% -> Cath: 80% PDA, 60% PAV and distal LAD dZ); B) 08/2010: Lexiscan - > ischemia the Ant & Inf Apex.  EF 64%.(Med Rx); C) Jan 2013: + Apical Ischemia - unchanged; D) Jan 2016: EF 76%. No Ischemia /Infarct.   TRANSTHORACIC ECHOCARDIOGRAM  11/2009   [facility]) A) December 2010, 2D echo: EF >55%, mild concentric LVH, diastolic dysfunction Gr. I, LVIDd 4.2 cm, LVIDs 2.3 cm, mild TR (peak RVP 28 mmHg).; B) November 2011, 2D  echo: EF >55%, mild concentric LVH, diastolic dysfunction Gr. I, LVIDd 4.3 cm, LVIDs 1.7 cm, mild PR (peak RVP 25 mmHg).; C) 12/2014 Echo: EF >55%, mild LVH, DD gr. I, LAE 3.9cm, Moderate TR (PRVP 35 mmHg)   TRANSTHORACIC ECHOCARDIOGRAM  06/13/2020   [facility]: EF 60-65%.  Normal LV function.  Indeterminate diastolic pressures.  Mild RV enlargement with severely elevated PAP estimated 95. moderate biatrial dilation.  Moderate to severe MR.  Moderate to severe TR.  Mild AS.   Family History  Problem Relation Age of Onset   Heart failure Mother    Heart attack Mother        mother died at 26 of an MI   Heart attack Father        father died at 57 of an MI   Heart attack Brother        Brother had an MI in his 31s   Social History   Tobacco Use   Smoking status: Some Days    Packs/day: 1.00    Years: 46.00    Pack years: 46.00    Types: Cigarettes   Smokeless tobacco: Never   Tobacco comments:    she feels that the 21 nicotine made her jittery and we suggested a lower level patch   Substance Use Topics   Alcohol use: No   Allergies  Allergen Reactions   Chantix [Varenicline] Hives, Shortness Of Breath and Palpitations   Diphenhydramine Hcl Shortness Of Breath   Potassium Itching and Swelling   Benadryl [Diphenhydramine Hcl] Rash   Niacin Rash   Trazodone Palpitations   Current Meds  Medication Sig   albuterol (PROVENTIL) (2.5 MG/3ML) 0.083% nebulizer solution Take 3 mLs (2.5 mg total) by nebulization every 6 (six) hours as needed for wheezing or shortness of breath.   alprazolam (XANAX) 2 MG tablet Take 2 mg by mouth 2 (two) times daily.    atorvastatin (LIPITOR) 80 MG tablet Take 80 mg by mouth daily.   busPIRone (BUSPAR) 10 MG tablet Take 10 mg by mouth 2 (two) times daily.    carvedilol (COREG) 6.25 MG tablet Take 6.25 mg by mouth 2 (two) times daily.   citalopram (CELEXA) 40 MG tablet Take 40 mg by mouth daily.   cyanocobalamin 1000 MCG tablet Take by mouth.   DULoxetine (CYMBALTA) 60 MG capsule Take 60 mg by mouth daily.   ezetimibe (ZETIA) 10 MG tablet Take 1 tablet (10 mg total) by mouth daily.   ferrous gluconate (FERGON) 324 MG tablet Take 1 tablet (324 mg total) by mouth daily with breakfast.   furosemide (LASIX) 40 MG tablet Take 1 tablet (40 mg total) by mouth daily.   gabapentin (NEURONTIN) 400 MG capsule Take 400 mg by mouth 3 (three) times daily.   ipratropium-albuterol (DUONEB) 0.5-2.5 (3) MG/3ML SOLN Take 3 mLs by nebulization every 6 (six) hours as needed.   isosorbide mononitrate (IMDUR) 30 MG 24 hr tablet Take 1 tablet (30 mg total) by mouth daily.   lisinopril (ZESTRIL) 10 MG tablet Take 1 tablet (10 mg total) by mouth daily.   nitroGLYCERIN (NITROSTAT) 0.4 MG SL tablet Place 0.4 mg under the tongue as needed.   pantoprazole (PROTONIX) 20 MG tablet Take 20 mg by mouth daily.   SYMBICORT 80-4.5 MCG/ACT inhaler Inhale 2 puffs into the lungs 2 (two) times daily.   Immunization History  Administered Date(s) Administered   Influenza,  Seasonal, Injecte, Preservative Fre 10/16/2006   Influenza,inj,Quad PF,6+ Mos 01/24/2015, 09/13/2015, 09/15/2016, 11/20/2018  Influenza-Unspecified 01/24/2015, 09/13/2015, 09/15/2016, 09/29/2017, 11/20/2018   Pneumococcal Conjugate-13 10/06/2017   Pneumococcal Polysaccharide-23 10/15/2011, 05/23/2015, 10/09/2019   Tdap 07/03/2011, 11/28/2014, 01/24/2015, 01/05/2016       Objective:   Physical Exam BP 102/64 (BP Location: Left Arm, Cuff Size: Normal)    Pulse 61    Temp 97.8 F (36.6 C) (Temporal)    Ht 5\' 7"  (1.702 m)    Wt 142 lb 12.8 oz (64.8 kg)    SpO2 99%    BMI 22.37 kg/m  GENERAL: Chronically ill-appearing woman, well-developed well-nourished, no acute distress.  Fully ambulatory. HEAD: Normocephalic, atraumatic.  EYES: Pupils equal, round, reactive to light.  No scleral icterus.  MOUTH: Nose/mouth/throat not examined due to masking requirements for COVID 19. NECK: Supple. No thyromegaly. Trachea midline. No JVD.  No adenopathy. PULMONARY: Good air entry bilaterally.  No adventitious sounds. CARDIOVASCULAR: S1 and S2. Regular rate and rhythm.  Grade 2/6 mitral regurgitation murmur. ABDOMEN: Benign. MUSCULOSKELETAL: No joint deformity, no clubbing, no edema.  Distal pulses diminished but present. NEUROLOGIC: No focal deficit, no gait disturbance, speech is fluent. SKIN: Intact,warm,dry. PSYCH: Anxious, distractible.  Memory impairment apparent.     Assessment & Plan:     ICD-10-CM   1. COPD suggested by initial evaluation (HCC)  J44.9    PFTs Continue Symbicort and albuterol for now    2. Shortness of breath  R06.02 Pulmonary Function Test [facility] Only   Will obtain PFTs Difficult to characterize Patient appears to be more concerned about leg pain    3. Moderate to severe mitral regurgitation  I34.0    Issue adds complexity to her management May be adding to her sensation of dyspnea Followed by cardiology    4. PAD (peripheral artery disease) (HCC)  I73.9    Leg pain  may be related to claudication Needs to follow-up [facility] Also encouraged to follow-up with primary physician    5. Tobacco dependence due to cigarettes  F17.210 Ambulatory Referral for Lung Cancer Scre   Refer to lung cancer screening Counseled with regards discontinuation of smoking     Orders Placed This Encounter  Procedures   Ambulatory Referral for Lung Cancer Scre    Referral Priority:   Routine    Referral Type:   Consultation    Referral Reason:   Specialty Services Required    Number of Visits Requested:   1   Pulmonary Function Test [facility] Only    Standing Status:   Future    Standing Expiration Date:   11/19/2022    Scheduling Instructions:     Next available.    Order Specific Question:   Full PFT: includes the following: basic spirometry, spirometry pre & post bronchodilator, diffusion capacity (DLCO), lung volumes    Answer:   Full PFT   We will see the patient in follow-up in 3 months time she is to contact us prior to that time should any new difficulties arise.  She has been instructed to continue her current inhalers until PFTs are performed.  We will determine at that time if any changes on her inhaler therapy are needed.  Gailen Shelter, MD Advanced Bronchoscopy P[facility]    *This note was dictated using voice recognition software/Dragon.  Despite best efforts to proofread, errors can occur which can change the meaning. Any transcriptional errors that result from this process are unintentional and may not be fully corrected at the time of dictation.

## 2021-11-26 ENCOUNTER — Other Ambulatory Visit: Payer: Medicare Other

## 2021-11-27 ENCOUNTER — Ambulatory Visit: Payer: Medicare Other

## 2021-11-28 ENCOUNTER — Encounter (INDEPENDENT_AMBULATORY_CARE_PROVIDER_SITE_OTHER): Payer: Medicare Other

## 2021-11-28 ENCOUNTER — Encounter (INDEPENDENT_AMBULATORY_CARE_PROVIDER_SITE_OTHER): Payer: Medicare Other | Admitting: Vascular Surgery

## 2021-12-05 ENCOUNTER — Encounter: Payer: Self-pay | Admitting: Pulmonary Disease

## 2021-12-10 NOTE — Telephone Encounter (Signed)
Created in error

## 2021-12-27 ENCOUNTER — Telehealth: Payer: Self-pay | Admitting: Oncology

## 2021-12-27 NOTE — Telephone Encounter (Signed)
Pt called to make an appt. Called back at [phone]

## 2021-12-30 NOTE — Telephone Encounter (Signed)
I called the pt. To just makes sure that she got the appts date and time and that there were no other concerns and she  did not  know about b12 inj but she is ok with it

## 2022-01-02 ENCOUNTER — Ambulatory Visit: Payer: Medicare Other | Admitting: Cardiology

## 2022-01-08 DIAGNOSIS — I6529 Occlusion and stenosis of unspecified carotid artery: Secondary | ICD-10-CM | POA: Insufficient documentation

## 2022-01-08 NOTE — Progress Notes (Signed)
MRN : [id number]  Traci Duran is a 66 y.o. (1956-01-25) female who presents with chief complaint of check circulation.  History of Present Illness:   The patient is seen for evaluation of carotid stenosis. The carotid stenosis was identified after a bruit was detected.  The patient denies amaurosis fugax. There is no recent history of TIA symptoms or focal motor deficits. There is no prior documented CVA.  She is denying left arm claudication or rest pain symptoms.  No history of ulcers or splinter hemorrhages.  There is no history of migraine headaches. There is no history of seizures.  The patient is taking enteric-coated aspirin 81 mg daily.   The patient is also seen for evaluation of painful lower extremities and diminished pulses. Patient notes the pain is always associated with activity and is very consistent day today. Typically, the pain occurs at less than one block, progress is as activity continues to the point that the patient must stop walking. Resting including standing still for several minutes allowed resumption of the activity and the ability to walk a similar distance before stopping again. Uneven terrain and inclined shorten the distance.   The patient denies rest pain or dangling of an extremity off the side of the bed during the night for relief. No open wounds or sores at this time. No prior interventions or surgeries.  No history of back problems or DJD of the lumbar sacral spine.   The patient denies changes in claudication symptoms or new rest pain symptoms.  No new ulcers or wounds of the foot.  The patient has a history of coronary artery disease, no recent episodes of angina or shortness of breath.  There is a history of hyperlipidemia which is being treated with a statin.    ABI's Rt=0.84 and Lt=0.86 (previous ABI Rt=0.74 and Lt=0.67)  Carotid duplex shows 60-79% bilateral internal carotid artery stenosis.  Subclavian duplex ultrasound shows  decreased flow left arm  No outpatient medications have been marked as taking for the 01/09/22 encounter (Appointment) with Gilda Crease, Latina Craver, MD.    Past Medical History:  Diagnosis Date   Anemia 05/2020   Thought to be related to GI bleed.   Anxiety    Bilateral carotid artery stenosis without cerebral infarction 10/2010    November 2011, CTA of the head and neck: Near complete occlusion versus severe stenosis of nearly the entire basilar artery, complete occlusion of the right vertebral artery from its origin off the right SCA, moderate noncalcified atherosclerotic in the proximal left SCA which results in a 40% stenosis. Right ICA with approximately 15% stenosis, left   Nov. 2012 Carotid Duplex: >75% diameter re   CHF (congestive heart failure), chronic HFrEF NYHA class II, chronic, diastolic (HCC)    Chronic HFrEF   COPD (chronic obstructive pulmonary disease) (HCC)    Coronary artery disease, non-occlusive 10/2009   Cardiac Cath: EF 85%;  40% prox &mid RCA -> 80% RPDA and AMrg, 60% RPAV.  40% prox & distal LAD w. 60% dLAD and D2, - Med Rx; Myoview 9/19: Small size, Mod severity Apical Anterior defect  c/w ISCHEMIA. -> INTERMEDIATE RISK (med Rx because of anemia, and known distal LAD and PDA disease. ->  Similar defect noted in August 2009)   Depression    Gastritis    GERD (gastroesophageal reflux disease)    Hemorrhoids    Hyperlipidemia    Hypertension    Mild aortic stenosis by prior echocardiogram 06/2020   Mild  aortic stenosis by echo   Moderate to severe mitral regurgitation 06/2020   Echo at [facility]: Moderate-severe MR with mild RV enlargement and severely elevated PA 95 mmHg.   Myocardial infarction [facility])    Demand ischemia infarction initially in 2010   PAD (peripheral artery disease) (HCC) 07/2008   a) 8/'09: normal ABIs; b) post procedure Dopplers December 2010: Suggestion of left PFA/CFA-SFV AV fistula.; c) 02/2010: Left CFA-PFA AV fistula still present.; d) 04/2010: CTA  Abd/Pelvis- LE Runoff: Extensive Mixed plaque in the Abd Ao.  Patent visceral As.  Bilat 2V runnoff-feet ATA & Peroneal A;  (confirmed by Arteriogram 03/2011); e) ABIs October 2017 R ABI 0.88, L ABI 0.79.   Pulmonary hypertension (HCC) 06/2020   [facility] echo shows moderate-severe MR, moderate to severe TR with RVP estimated 95 mmHg.   Stenosis of left subclavian artery (HCC) 10/27/2010   ~75% by recent doppler -- CHECK BP ON R ARM (or bilaterally for confirmation)    Past Surgical History:  Procedure Laterality Date   APPENDECTOMY     COLONOSCOPY WITH PROPOFOL N/A 06/09/2017   Procedure: COLONOSCOPY WITH PROPOFOL;  Surgeon: Midge Minium, MD;  Location: [facility] ENDOSCOPY;  Service: Endoscopy;  Laterality: N/A;   COLONOSCOPY WITH PROPOFOL N/A 10/08/2019   Procedure: COLONOSCOPY WITH PROPOFOL;  Surgeon: Midge Minium, MD;  Location: [facility] ENDOSCOPY;  Service: Endoscopy;  Laterality: N/A;   ENTEROSCOPY N/A 10/08/2019   Procedure: ENTEROSCOPY;  Surgeon: Midge Minium, MD;  Location[facility] ENDOSCOPY;  Service: Endoscopy;  Laterality: N/A;   ENTEROSCOPY N/A 07/18/2020   Procedure: ENTEROSCOPY;  Surgeon: Toney Reil, MD;  Location: [facility] ENDOSCOPY;  Service: Gastroenterology;  Laterality: N/A;   ESOPHAGOGASTRODUODENOSCOPY N/A 07/30/2017   Procedure: ESOPHAGOGASTRODUODENOSCOPY (EGD);  Surgeon: Toney Reil, MD;  Location: [facility] ENDOSCOPY;  Service: Gastroenterology;  Laterality: N/A;   ESOPHAGOGASTRODUODENOSCOPY (EGD) WITH PROPOFOL N/A 04/09/2017   Procedure: ESOPHAGOGASTRODUODENOSCOPY (EGD) WITH PROPOFOL;  Surgeon: Midge Minium, MD;  Location: [facility] ENDOSCOPY;  Service: Endoscopy;  Laterality: N/A;   GIVENS CAPSULE STUDY N/A 10/08/2019   Procedure: GIVENS CAPSULE STUDY;  Surgeon: Midge Minium, MD;  Location: [facility] ENDOSCOPY;  Service: Endoscopy;  Laterality: N/A;   HIP FRACTURE SURGERY     LEFT HEART CATH AND CORONARY ANGIOGRAPHY  10/2009   [facility]: EF 85%;  40% prox &mid RCA -> 80% RPDA and  AMrg, 60% RPAV.  40% prox & distal LAD w. 60% dLAD and D2, - Med Rx   LOWER EXTREMITY ARTERIOGRAM Bilateral 03/28/2011   ([facility]) RLE: moderate focal prox. R> CIA stenosis, patent PFA, SFA, CFA, 2 vessel runoff via PT/peroneal, AT proximally occluded. LLE: moderate CFA stenosis, patent PFA, SFA and PA, 2 vessel runoff via AT/peroneal, PT occluded.   NM GATED MYOVIEW ([facility] HX)  08/2018   Small sized mild severity defect in the apical anterior wall concerning for ischemia.  INTERMEDIATE RISK. (Appears to be no change from 2009)   NM MYOVIEW LTD  07/2008   ([facility]): A) 07/2008: Minimal reversible defect in the apex with possible ischemia.  Likely represents artifact.  EF 59% -> Cath: 80% PDA, 60% PAV and distal LAD dZ); B) 08/2010: Lexiscan - > ischemia the Ant & Inf Apex.  EF 64%.(Med Rx); C) Jan 2013: + Apical Ischemia - unchanged; D) Jan 2016: EF 76%. No Ischemia /Infarct.   TRANSTHORACIC ECHOCARDIOGRAM  11/2009   [facility]) A) December 2010, 2D echo: EF >55%, mild concentric LVH, diastolic dysfunction Gr. I, LVIDd 4.2 cm, LVIDs 2.3 cm, mild TR (  peak RVP 28 mmHg).; B) November 2011, 2D echo: EF >55%, mild concentric LVH, diastolic dysfunction Gr. I, LVIDd 4.3 cm, LVIDs 1.7 cm, mild PR (peak RVP 25 mmHg).; C) 12/2014 Echo: EF >55%, mild LVH, DD gr. I, LAE 3.9cm, Moderate TR (PRVP 35 mmHg)   TRANSTHORACIC ECHOCARDIOGRAM  06/13/2020   [facility]: EF 60-65%.  Normal LV function.  Indeterminate diastolic pressures.  Mild RV enlargement with severely elevated PAP estimated 95. moderate biatrial dilation.  Moderate to severe MR.  Moderate to severe TR.  Mild AS.    Social History Social History   Tobacco Use   Smoking status: Some Days    Packs/day: 1.00    Years: 46.00    Pack years: 46.00    Types: Cigarettes   Smokeless tobacco: Never   Tobacco comments:    0.5PPD 11/19/2021  Vaping Use   Vaping Use: Never used  Substance Use Topics   Alcohol use: No   Drug use: Yes     Types: Marijuana    Comment: monthly    Family History Family History  Problem Relation Age of Onset   Heart failure Mother    Heart attack Mother        mother died at 58 of an MI   Heart attack Father        father died at 48 of an MI   Heart attack Brother        Brother had an MI in his 33s    Allergies  Allergen Reactions   Chantix [Varenicline] Hives, Shortness Of Breath and Palpitations   Diphenhydramine Hcl Shortness Of Breath   Potassium Itching and Swelling   Benadryl [Diphenhydramine Hcl] Rash   Niacin Rash   Trazodone Palpitations     REVIEW OF SYSTEMS (Negative unless checked)  Constitutional: [] Weight loss  [] Fever  [] Chills Cardiac: [] Chest pain   [] Chest pressure   [] Palpitations   [] Shortness of breath when laying flat   [] Shortness of breath with exertion. Vascular:  [x] Pain in legs with walking   [] Pain in legs at rest  [] History of DVT   [] Phlebitis   [] Swelling in legs   [] Varicose veins   [] Non-healing ulcers Pulmonary:   [] Uses home oxygen   [] Productive cough   [] Hemoptysis   [] Wheeze  [x] COPD   [] Asthma Neurologic:  [] Dizziness   [] Seizures   [] History of stroke   [] History of TIA  [] Aphasia   [] Vissual changes   [] Weakness or numbness in arm   [] Weakness or numbness in leg Musculoskeletal:   [] Joint swelling   [x] Joint pain   [] Low back pain Hematologic:  [] Easy bruising  [] Easy bleeding   [] Hypercoagulable state   [] Anemic Gastrointestinal:  [] Diarrhea   [] Vomiting  [] Gastroesophageal reflux/heartburn   [] Difficulty swallowing. Genitourinary:  [] Chronic kidney disease   [] Difficult urination  [] Frequent urination   [] Blood in urine Skin:  [] Rashes   [] Ulcers  Psychological:  [] History of anxiety   []  History of major depression.  Physical Examination  There were no vitals filed for this visit. There is no height or weight on file to calculate BMI. Gen: WD/WN, NAD Head: [place]/AT, No temporalis wasting.  Ear/Nose/Throat: Hearing grossly intact,  nares w/o erythema or drainage Eyes: PER, EOMI, sclera nonicteric.  Neck: Supple, no masses.  No bruit or JVD.  Pulmonary:  Good air movement, no audible wheezing, no use of accessory muscles.  Cardiac: RRR, normal S1, S2, no Murmurs. Vascular:   bilateral carotid bruits Vessel Right Left  Radial Palpable  Trace Palpable  Carotid Palpable Palpable  PT Not Palpable Not Palpable  DP Not Palpable Not Palpable  Gastrointestinal: soft, non-distended. No guarding/no peritoneal signs.  Musculoskeletal: M/S 5/5 throughout.  No visible deformity.  Neurologic: CN 2-12 intact. Pain and light touch intact in extremities.  Symmetrical.  Speech is fluent. Motor exam as listed above. Psychiatric: Judgment intact, Mood & affect appropriate for pt's clinical situation. Dermatologic: No rashes or ulcers noted.  No changes consistent with cellulitis.   CBC Lab Results  Component Value Date   WBC 8.1 06/17/2021   HGB 10.7 (L) 06/17/2021   HCT 33.9 (L) 06/17/2021   MCV 78.5 (L) 06/17/2021   PLT 292 06/17/2021    BMET    Component Value Date/Time   NA 141 07/18/2020 0545   K 3.8 07/18/2020 0545   CL 109 07/18/2020 0545   CO2 25 07/18/2020 0545   GLUCOSE 81 07/18/2020 0545   BUN 7 (L) 07/18/2020 0545   CREATININE 0.64 07/18/2020 0545   CALCIUM 8.5 (L) 07/18/2020 0545   GFRNONAA >60 07/18/2020 0545   GFRAA >60 07/18/2020 0545   CrCl cannot be calculated (Patient's most recent lab result is older than the maximum 21 days allowed.).  COAG Lab Results  Component Value Date   INR 1.05 11/19/2018   INR 1.09 07/28/2017   INR 1.07 04/06/2017    Radiology No results found.   Assessment/Plan 1. Bilateral carotid artery stenosis Recommend:  Given the patient's asymptomatic subcritical stenosis no further invasive testing or surgery at this time.  Duplex ultrasound shows 60-79% stenosis bilaterally.  Continue antiplatelet therapy as prescribed Continue management of CAD, HTN and  Hyperlipidemia Healthy heart diet,  encouraged exercise at least 4 times per week Follow up in 6 months with duplex ultrasound and physical exam   - VAS US CAROTID; Future  2. Stenosis of left subclavian artery (HCC) At the present time the patient is asymptomatic with respect to her left subclavian stenosis.  The differential between the right and left arm on her ABI is borderline.  Based on these findings intervention is not recommended at this time and will continue to follow this with ABIs and carotid duplex  3. PAD (peripheral artery disease) (HCC)  Recommend:  The patient has evidence of atherosclerosis of the lower extremities with claudication.  The patient does not voice lifestyle limiting changes at this point in time.  Noninvasive studies do not suggest clinically significant change.  No invasive studies, angiography or surgery at this time The patient should continue walking and begin a more formal exercise program.  The patient should continue antiplatelet therapy and aggressive treatment of the lipid abnormalities  No changes in the patient's medications at this time  - VAS [place] ABI WITH/WO TBI; Future  4. Coronary artery disease, non-occlusive Continue cardiac and antihypertensive medications as already ordered and reviewed, no changes at this time.  Continue statin as ordered and reviewed, no changes at this time  Nitrates PRN for chest pain   5. Centrilobular emphysema (HCC) Continue pulmonary medications and aerosols as already ordered, these medications have been reviewed and there are no changes at this time.    6. Hyperlipidemia LDL goal <70 Continue statin as ordered and reviewed, no changes at this time     Levora Dredge, MD  01/08/2022 9:44 AM

## 2022-01-09 ENCOUNTER — Ambulatory Visit (INDEPENDENT_AMBULATORY_CARE_PROVIDER_SITE_OTHER): Payer: Medicare Other

## 2022-01-09 ENCOUNTER — Other Ambulatory Visit (INDEPENDENT_AMBULATORY_CARE_PROVIDER_SITE_OTHER): Payer: Self-pay | Admitting: Vascular Surgery

## 2022-01-09 ENCOUNTER — Ambulatory Visit (INDEPENDENT_AMBULATORY_CARE_PROVIDER_SITE_OTHER): Payer: Medicare Other | Admitting: Vascular Surgery

## 2022-01-09 ENCOUNTER — Other Ambulatory Visit: Payer: Self-pay

## 2022-01-09 ENCOUNTER — Encounter (INDEPENDENT_AMBULATORY_CARE_PROVIDER_SITE_OTHER): Payer: Self-pay | Admitting: Vascular Surgery

## 2022-01-09 VITALS — BP 159/78 | HR 73 | Resp 16 | Ht 67.0 in | Wt 141.0 lb

## 2022-01-09 DIAGNOSIS — I6523 Occlusion and stenosis of bilateral carotid arteries: Secondary | ICD-10-CM | POA: Diagnosis not present

## 2022-01-09 DIAGNOSIS — I771 Stricture of artery: Secondary | ICD-10-CM

## 2022-01-09 DIAGNOSIS — I739 Peripheral vascular disease, unspecified: Secondary | ICD-10-CM

## 2022-01-09 DIAGNOSIS — I2510 Atherosclerotic heart disease of native coronary artery without angina pectoris: Secondary | ICD-10-CM | POA: Diagnosis not present

## 2022-01-09 DIAGNOSIS — J432 Centrilobular emphysema: Secondary | ICD-10-CM

## 2022-01-09 DIAGNOSIS — E785 Hyperlipidemia, unspecified: Secondary | ICD-10-CM

## 2022-01-11 ENCOUNTER — Encounter (INDEPENDENT_AMBULATORY_CARE_PROVIDER_SITE_OTHER): Payer: Self-pay | Admitting: Vascular Surgery

## 2022-01-20 ENCOUNTER — Telehealth: Payer: Self-pay | Admitting: Acute Care

## 2022-01-20 NOTE — Telephone Encounter (Signed)
Called to schedule annual LDCT. Had sdmv [id number] in [place] - only needs CT.  Unable to reach or leave VM.

## 2022-01-21 ENCOUNTER — Other Ambulatory Visit: Payer: Self-pay | Admitting: *Deleted

## 2022-01-21 DIAGNOSIS — D500 Iron deficiency anemia secondary to blood loss (chronic): Secondary | ICD-10-CM

## 2022-01-29 ENCOUNTER — Inpatient Hospital Stay: Payer: Medicare Other | Admitting: Oncology

## 2022-01-29 ENCOUNTER — Inpatient Hospital Stay: Payer: Medicare Other

## 2022-01-30 ENCOUNTER — Telehealth: Payer: Self-pay

## 2022-01-30 NOTE — Telephone Encounter (Signed)
Patient aware of upcoming covid test. Nothing further needed.  

## 2022-01-31 ENCOUNTER — Other Ambulatory Visit: Payer: Medicare Other

## 2022-01-31 ENCOUNTER — Ambulatory Visit: Payer: Medicare Other | Admitting: Oncology

## 2022-01-31 ENCOUNTER — Ambulatory Visit: Payer: Medicare Other

## 2022-02-03 ENCOUNTER — Other Ambulatory Visit: Payer: Medicare Other

## 2022-02-03 ENCOUNTER — Telehealth: Payer: Self-pay | Admitting: *Deleted

## 2022-02-03 NOTE — Telephone Encounter (Signed)
Patient needs to reschedule missed appointments for MD/Lab/ infusion. She states she missed the appointments and is now feeling low in iron.

## 2022-02-04 ENCOUNTER — Ambulatory Visit: Payer: Medicare Other

## 2022-02-06 ENCOUNTER — Encounter: Payer: Self-pay | Admitting: Oncology

## 2022-02-10 ENCOUNTER — Encounter: Payer: Self-pay | Admitting: Cardiology

## 2022-02-10 ENCOUNTER — Other Ambulatory Visit: Payer: Self-pay

## 2022-02-10 ENCOUNTER — Ambulatory Visit (INDEPENDENT_AMBULATORY_CARE_PROVIDER_SITE_OTHER): Payer: Medicare Other | Admitting: Cardiology

## 2022-02-10 VITALS — BP 170/80 | HR 62 | Ht 67.0 in | Wt 143.0 lb

## 2022-02-10 DIAGNOSIS — F17200 Nicotine dependence, unspecified, uncomplicated: Secondary | ICD-10-CM

## 2022-02-10 DIAGNOSIS — I2510 Atherosclerotic heart disease of native coronary artery without angina pectoris: Secondary | ICD-10-CM | POA: Diagnosis not present

## 2022-02-10 DIAGNOSIS — E782 Mixed hyperlipidemia: Secondary | ICD-10-CM | POA: Diagnosis not present

## 2022-02-10 DIAGNOSIS — I10 Essential (primary) hypertension: Secondary | ICD-10-CM

## 2022-02-10 DIAGNOSIS — I340 Nonrheumatic mitral (valve) insufficiency: Secondary | ICD-10-CM

## 2022-02-10 NOTE — Progress Notes (Signed)
Cardiology Office Note:    Date:  02/10/2022   ID:  Traci Duran, DOB 06/10/1956, MRN [id number]  PCP:  [name]  Cardiologist:  None  Electrophysiologist:  None   Referring MD: [name]*   Chief Complaint  Patient presents with   Follow-up    F/U after echo    History of Present Illness:    Traci Duran is a 66 y.o. female with a hx of CAD (three-vessel coronary calcification on chest CT ), anxiety, COPD, PAD, GI bleed, anemia, current smoker x40+ years, who presents for follow-up.    Patient being seen for CAD, hypertension, dizziness.  Has chronic history of dizziness and claudication.  Follows up with vascular surgery, states missing last appointment.  She still smokes.  Lisinopril decreased to 10 mg daily due to significant dizziness.  Patient states symptoms improved with reducing lisinopril.  Her blood pressures are running high.  States taking vitamin B shots frequently, follows up with PCP for this.  Would like to get potassium checked.  States getting potassium infusions in the past unsure of provider who ordered these.  Prior notes  Echo 10/2021 EF 60 to 65%, moderate MR Lexiscan Myoview 08/2018 small apical anterior ischemia Chest CT lung cancer screening 09/26/2017 three-vessel coronary artery calcification.  Invasive work-up not planned due to history of anemia/GI bleeds.  Patient has a history of GI bleed requiring transfusions .   Past Medical History:  Diagnosis Date   Anemia 05/2020   Thought to be related to GI bleed.   Anxiety    Bilateral carotid artery stenosis without cerebral infarction 10/2010    November 2011, CTA of the head and neck: Near complete occlusion versus severe stenosis of nearly the entire basilar artery, complete occlusion of the right vertebral artery from its origin off the right SCA, moderate noncalcified atherosclerotic in the proximal left SCA which results in a 40% stenosis. Right ICA with  approximately 15% stenosis, left   Nov. 2012 Carotid Duplex: >75% diameter re   CHF (congestive heart failure), chronic HFrEF NYHA class II, chronic, diastolic (HCC)    Chronic HFrEF   COPD (chronic obstructive pulmonary disease) (HCC)    Coronary artery disease, non-occlusive 10/2009   Cardiac Cath: EF 85%;  40% prox &mid RCA -> 80% RPDA and AMrg, 60% RPAV.  40% prox & distal LAD w. 60% dLAD and D2, - Med Rx; Myoview 9/19: Small size, Mod severity Apical Anterior defect  c/w ISCHEMIA. -> INTERMEDIATE RISK (med Rx because of anemia, and known distal LAD and PDA disease. ->  Similar defect noted in August 2009)   Depression    Gastritis    GERD (gastroesophageal reflux disease)    Hemorrhoids    Hyperlipidemia    Hypertension    Mild aortic stenosis by prior echocardiogram 06/2020   Mild aortic stenosis by echo   Moderate to severe mitral regurgitation 06/2020   Echo at [facility]: Moderate-severe MR with mild RV enlargement and severely elevated PA 95 mmHg.   Myocardial infarction [facility])    Demand ischemia infarction initially in 2010   PAD (peripheral artery disease) (HCC) 07/2008   a) 8/'09: normal ABIs; b) post procedure Dopplers December 2010: Suggestion of left PFA/CFA-SFV AV fistula.; c) 02/2010: Left CFA-PFA AV fistula still present.; d) 04/2010: CTA Abd/Pelvis- LE Runoff: Extensive Mixed plaque in the Abd Ao.  Patent visceral As.  Bilat 2V runnoff-feet ATA & Peroneal A;  (confirmed by Arteriogram 03/2011); e) ABIs  October 2017 R ABI 0.88, L ABI 0.79.   Pulmonary hypertension (HCC) 06/2020   [facility] echo shows moderate-severe MR, moderate to severe TR with RVP estimated 95 mmHg.   Stenosis of left subclavian artery (HCC) 10/27/2010   ~75% by recent doppler -- CHECK BP ON R ARM (or bilaterally for confirmation)    Past Surgical History:  Procedure Laterality Date   APPENDECTOMY     COLONOSCOPY WITH PROPOFOL N/A 06/09/2017   Procedure: COLONOSCOPY WITH PROPOFOL;  Surgeon: Midge Minium, MD;   Location: [facility] ENDOSCOPY;  Service: Endoscopy;  Laterality: N/A;   COLONOSCOPY WITH PROPOFOL N/A 10/08/2019   Procedure: COLONOSCOPY WITH PROPOFOL;  Surgeon: Midge Minium, MD;  Location: [facility] ENDOSCOPY;  Service: Endoscopy;  Laterality: N/A;   ENTEROSCOPY N/A 10/08/2019   Procedure: ENTEROSCOPY;  Surgeon: Midge Minium, MD;  Location: [facility] ENDOSCOPY;  Service: Endoscopy;  Laterality: N/A;   ENTEROSCOPY N/A 07/18/2020   Procedure: ENTEROSCOPY;  Surgeon: Toney Reil, MD;  Location: [facility] ENDOSCOPY;  Service: Gastroenterology;  Laterality: N/A;   ESOPHAGOGASTRODUODENOSCOPY N/A 07/30/2017   Procedure: ESOPHAGOGASTRODUODENOSCOPY (EGD);  Surgeon: Toney Reil, MD;  Location: [facility] ENDOSCOPY;  Service: Gastroenterology;  Laterality: N/A;   ESOPHAGOGASTRODUODENOSCOPY (EGD) WITH PROPOFOL N/A 04/09/2017   Procedure: ESOPHAGOGASTRODUODENOSCOPY (EGD) WITH PROPOFOL;  Surgeon: Midge Minium, MD;  Location: [facility] ENDOSCOPY;  Service: Endoscopy;  Laterality: N/A;   GIVENS CAPSULE STUDY N/A 10/08/2019   Procedure: GIVENS CAPSULE STUDY;  Surgeon: Midge Minium, MD;  Location: [facility] ENDOSCOPY;  Service: Endoscopy;  Laterality: N/A;   HIP FRACTURE SURGERY     LEFT HEART CATH AND CORONARY ANGIOGRAPHY  10/2009   [facility]: EF 85%;  40% prox &mid RCA -> 80% RPDA and AMrg, 60% RPAV.  40% prox & distal LAD w. 60% dLAD and D2, - Med Rx   LOWER EXTREMITY ARTERIOGRAM Bilateral 03/28/2011   ([facility]) RLE: moderate focal prox. R> CIA stenosis, patent PFA, SFA, CFA, 2 vessel runoff via PT/peroneal, AT proximally occluded. LLE: moderate CFA stenosis, patent PFA, SFA and PA, 2 vessel runoff via AT/peroneal, PT occluded.   NM GATED MYOVIEW ([facility] HX)  08/2018   Small sized mild severity defect in the apical anterior wall concerning for ischemia.  INTERMEDIATE RISK. (Appears to be no change from 2009)   NM MYOVIEW LTD  07/2008   ([facility]): A) 07/2008: Minimal reversible defect in the apex with possible ischemia.   Likely represents artifact.  EF 59% -> Cath: 80% PDA, 60% PAV and distal LAD dZ); B) 08/2010: Lexiscan - > ischemia the Ant & Inf Apex.  EF 64%.(Med Rx); C) Jan 2013: + Apical Ischemia - unchanged; D) Jan 2016: EF 76%. No Ischemia /Infarct.   TRANSTHORACIC ECHOCARDIOGRAM  11/2009   [facility]) A) December 2010, 2D echo: EF >55%, mild concentric LVH, diastolic dysfunction Gr. I, LVIDd 4.2 cm, LVIDs 2.3 cm, mild TR (peak RVP 28 mmHg).; B) November 2011, 2D echo: EF >55%, mild concentric LVH, diastolic dysfunction Gr. I, LVIDd 4.3 cm, LVIDs 1.7 cm, mild PR (peak RVP 25 mmHg).; C) 12/2014 Echo: EF >55%, mild LVH, DD gr. I, LAE 3.9cm, Moderate TR (PRVP 35 mmHg)   TRANSTHORACIC ECHOCARDIOGRAM  06/13/2020   [facility]: EF 60-65%.  Normal LV function.  Indeterminate diastolic pressures.  Mild RV enlargement with severely elevated PAP estimated 95. moderate biatrial dilation.  Moderate to severe MR.  Moderate to severe TR.  Mild AS.    Current Medications: Current Meds  Medication Sig   albuterol (PROVENTIL) (2.5 MG/3ML) 0.083%  nebulizer solution Take 3 mLs (2.5 mg total) by nebulization every 6 (six) hours as needed for wheezing or shortness of breath.   alprazolam (XANAX) 2 MG tablet Take 2 mg by mouth 2 (two) times daily.    aspirin EC 81 MG tablet Take 81 mg by mouth daily. Swallow whole.   atorvastatin (LIPITOR) 80 MG tablet Take 80 mg by mouth daily.   busPIRone (BUSPAR) 10 MG tablet Take 10 mg by mouth 2 (two) times daily.    carvedilol (COREG) 6.25 MG tablet Take 6.25 mg by mouth 2 (two) times daily.   citalopram (CELEXA) 40 MG tablet Take 40 mg by mouth daily.   DULoxetine (CYMBALTA) 60 MG capsule Take 60 mg by mouth daily.   ezetimibe (ZETIA) 10 MG tablet Take 1 tablet (10 mg total) by mouth daily.   furosemide (LASIX) 40 MG tablet Take 1 tablet (40 mg total) by mouth daily.   gabapentin (NEURONTIN) 300 MG capsule Take 300 mg by mouth 2 (two) times daily.   gabapentin (NEURONTIN) 400  MG capsule Take 400 mg by mouth 3 (three) times daily.   ipratropium-albuterol (DUONEB) 0.5-2.5 (3) MG/3ML SOLN Take 3 mLs by nebulization every 6 (six) hours as needed.   isosorbide mononitrate (IMDUR) 30 MG 24 hr tablet Take 1 tablet (30 mg total) by mouth daily.   lisinopril (ZESTRIL) 10 MG tablet Take 1 tablet (10 mg total) by mouth daily.   nitroGLYCERIN (NITROSTAT) 0.4 MG SL tablet Place 0.4 mg under the tongue as needed.   pantoprazole (PROTONIX) 20 MG tablet Take 20 mg by mouth daily.   SYMBICORT 80-4.5 MCG/ACT inhaler Inhale 2 puffs into the lungs 2 (two) times daily.     Allergies:   Chantix [varenicline], Diphenhydramine hcl, Potassium, Benadryl [diphenhydramine hcl], Niacin, and Trazodone   Social History   Socioeconomic History   Marital status: Widowed    Spouse name: Not on file   Number of children: 3   Years of education: Not on file   Highest education level: Not on file  Occupational History   Not on file  Tobacco Use   Smoking status: Some Days    Packs/day: 1.00    Years: 46.00    Pack years: 46.00    Types: Cigarettes   Smokeless tobacco: Never   Tobacco comments:    0.5PPD 11/19/2021  Vaping Use   Vaping Use: Never used  Substance and Sexual Activity   Alcohol use: No   Drug use: Yes    Types: Marijuana    Comment: monthly   Sexual activity: Not Currently  Other Topics Concern   Not on file  Social History Narrative   She is a widowed mother of at least 3 daughters.  Unfortunately all 3 daughters have deceased.        2015: Her youngest daughter and son-in-law were run over by an [place] train   November 12, 2017: Middle daughter died of complications of DKA   12-Apr-2021: Oldest daughter died cancer after being on palliative care.   She also had a dog that died after being with her for 18 years.   Social Determinants of Health   Financial Resource Strain: Not on file  Food Insecurity: Not on file  Transportation Needs: Not on file  Physical Activity: Not  on file  Stress: Not on file  Social Connections: Not on file     Family History: The patient's family history includes Heart attack in her brother, father, and mother; Heart failure in  her mother.  ROS:   Please see the history of present illness.     All other systems reviewed and are negative.  EKGs/Labs/Other Studies Reviewed:    The following studies were reviewed today:   EKG:  EKG is  ordered today.  The ekg ordered today demonstrates normal sinus rhythm, normal ECG.    Recent Labs: 06/17/2021: Hemoglobin 10.7; Platelets 292  Recent Lipid Panel    Component Value Date/Time   CHOL 214 (H) 10/15/2011 0506   TRIG 225 (H) 10/15/2011 0506   HDL 45 10/15/2011 0506   CHOLHDL 4.8 10/15/2011 0506   VLDL 45 (H) 10/15/2011 0506   LDLCALC 124 (H) 10/15/2011 0506    Physical Exam:    VS:  BP (!) 170/80 (BP Location: Right Arm, Patient Position: Sitting, Cuff Size: Normal)    Pulse 62    Ht 5\' 7"  (1.702 m)    Wt 143 lb (64.9 kg)    SpO2 98%    BMI 22.40 kg/m     Wt Readings from Last 3 Encounters:  02/10/22 143 lb (64.9 kg)  01/09/22 141 lb (64 kg)  11/19/21 142 lb 12.8 oz (64.8 kg)     GEN:  Well nourished, appears fatigued HEENT: Normal NECK: No JVD; No carotid bruits LYMPHATICS: No lymphadenopathy CARDIAC: RRR, 2/6 systolic murmur, rubs, gallops RESPIRATORY:  Clear to auscultation without rales, wheezing or rhonchi  ABDOMEN: Soft, non-tender, non-distended MUSCULOSKELETAL:  No edema; No deformity  SKIN: Warm and dry NEUROLOGIC:  Alert and oriented x 3 PSYCHIATRIC:  Normal affect   ASSESSMENT:    1. Coronary artery disease involving native coronary artery of native heart without angina pectoris   2. Mitral valve insufficiency, unspecified etiology   3. Primary hypertension   4. Mixed hyperlipidemia   5. Smoking     PLAN:    In order of problems listed above:  CAD, three-vessel coronary calcification, prior abnormal stress test.  EF 60 to 65%, severe  pulmonary hypertension likely WHO class III from COPD.   continue Imdur, aspirin 81, Coreg, Lipitor 80 mg.  Denies chest pain.  LDL at goal.   Due to history of GI bleeds, requiring transfusions, will refrain from any invasive strategy at this time.  Moderate mitral regurgitation, monitor with serial echocardiograms yearly. Hypertension, BP elevated, patient did not tolerate higher doses of BP meds.  Had significant dizziness leading to wobbly gait and almost falls.  Continue Coreg, lisinopril 10. She is a current smoker, cessation advised. Mixed hyperlipidemia, LDL at goal.  Continue Lipitor 80 mg daily.  Follow-up in 6 months  Total encounter time 50 minutes  Greater than 50% was spent in counseling and coordination of care with the patient    Medication Adjustments/Labs and Tests Ordered: Current medicines are reviewed at length with the patient today.  Concerns regarding medicines are outlined above.  Orders Placed This Encounter  Procedures   Basic metabolic panel   EKG 12-Lead   No orders of the defined types were placed in this encounter.   Patient Instructions  Medication Instructions:   Your physician recommends that you continue on your current medications as directed. Please refer to the Current Medication list given to you today.  *If you need a refill on your cardiac medications before your next appointment, please call your pharmacy*   Lab Work:  BMP drawn in office today.  If you have labs (blood work) drawn today and your tests are completely normal, you will receive your results only  by: MyChart Message (if you have MyChart) OR A paper copy in the mail If you have any lab test that is abnormal or we need to change your treatment, we will call you to review the results.   Testing/Procedures: None Ordered   Follow-Up: At [facility], you and your health needs are our priority.  As part of our continuing mission to provide you with exceptional heart  care, we have created designated Provider Care Teams.  These Care Teams include your primary Cardiologist (physician) and Advanced Practice Providers (APPs -  Physician Assistants and Nurse Practitioners) who all work together to provide you with the care you need, when you need it.  We recommend signing up for the patient portal called "MyChart".  Sign up information is provided on this After Visit Summary.  MyChart is used to connect with patients for Virtual Visits (Telemedicine).  Patients are able to view lab/test results, encounter notes, upcoming appointments, etc.  Non-urgent messages can be sent to your provider as well.   To learn more about what you can do with MyChart, go to [email or web address].    Your next appointment:   6 month(s)  The format for your next appointment:   In Person  Provider:   You may see Debbe Odea, MD or one of the following Advanced Practice Providers on your designated Care Team:   Nicolasa Ducking, NP Eula Listen, PA-C Cadence Fransico Michael, [place]    Other Instructions     Signed, Debbe Odea, MD  02/10/2022 12:32 PM    [facility]

## 2022-02-10 NOTE — Patient Instructions (Signed)
Medication Instructions:  ? ?Your physician recommends that you continue on your current medications as directed. Please refer to the Current Medication list given to you today. ? ?*If you need a refill on your cardiac medications before your next appointment, please call your pharmacy* ? ? ?Lab Work: ? ?BMP drawn in office today. ? ?If you have labs (blood work) drawn today and your tests are completely normal, you will receive your results only by: ?MyChart Message (if you have MyChart) OR ?A paper copy in the mail ?If you have any lab test that is abnormal or we need to change your treatment, we will call you to review the results. ? ? ?Testing/Procedures: ?None Ordered ? ? ?Follow-Up: ?At [facility], you and your health needs are our priority.  As part of our continuing mission to provide you with exceptional heart care, we have created designated Provider Care Teams.  These Care Teams include your primary Cardiologist (physician) and Advanced Practice Providers (APPs -  Physician Assistants and Nurse Practitioners) who all work together to provide you with the care you need, when you need it. ? ?We recommend signing up for the patient portal called "MyChart".  Sign up information is provided on this After Visit Summary.  MyChart is used to connect with patients for Virtual Visits (Telemedicine).  Patients are able to view lab/test results, encounter notes, upcoming appointments, etc.  Non-urgent messages can be sent to your provider as well.   ?To learn more about what you can do with MyChart, go to [email or web address].   ? ?Your next appointment:   ?6 month(s) ? ?The format for your next appointment:   ?In Person ? ?Provider:   ?You may see Kate Sable, MD or one of the following Advanced Practice Providers on your designated Care Team:   ?Murray Hodgkins, NP ?Christell Faith, PA-C ?Cadence Kathlen Mody, PA-C  ? ? ?Other Instructions ? ? ?

## 2022-02-11 ENCOUNTER — Telehealth: Payer: Self-pay

## 2022-02-11 ENCOUNTER — Inpatient Hospital Stay: Payer: Medicare Other | Attending: Oncology

## 2022-02-11 ENCOUNTER — Encounter: Payer: Self-pay | Admitting: Oncology

## 2022-02-11 ENCOUNTER — Inpatient Hospital Stay (HOSPITAL_BASED_OUTPATIENT_CLINIC_OR_DEPARTMENT_OTHER): Payer: Medicare Other | Admitting: Oncology

## 2022-02-11 ENCOUNTER — Inpatient Hospital Stay: Payer: Medicare Other

## 2022-02-11 VITALS — BP 168/81 | HR 73 | Resp 16 | Wt 143.5 lb

## 2022-02-11 DIAGNOSIS — D500 Iron deficiency anemia secondary to blood loss (chronic): Secondary | ICD-10-CM

## 2022-02-11 DIAGNOSIS — E538 Deficiency of other specified B group vitamins: Secondary | ICD-10-CM

## 2022-02-11 LAB — COMPREHENSIVE METABOLIC PANEL
ALT: 23 U/L (ref 0–44)
AST: 25 U/L (ref 15–41)
Albumin: 3.5 g/dL (ref 3.5–5.0)
Alkaline Phosphatase: 106 U/L (ref 38–126)
Anion gap: 6 (ref 5–15)
BUN: 11 mg/dL (ref 8–23)
CO2: 23 mmol/L (ref 22–32)
Calcium: 8.7 mg/dL — ABNORMAL LOW (ref 8.9–10.3)
Chloride: 104 mmol/L (ref 98–111)
Creatinine, Ser: 1.06 mg/dL — ABNORMAL HIGH (ref 0.44–1.00)
GFR, Estimated: 58 mL/min — ABNORMAL LOW (ref >60)
Glucose, Bld: 89 mg/dL (ref 70–99)
Potassium: 3.9 mmol/L (ref 3.5–5.1)
Sodium: 133 mmol/L — ABNORMAL LOW (ref 135–145)
Total Bilirubin: 0.3 mg/dL (ref 0.3–1.2)
Total Protein: 6.6 g/dL (ref 6.5–8.1)

## 2022-02-11 LAB — CBC WITH DIFFERENTIAL/PLATELET
Abs Immature Granulocytes: 0.03 10*3/uL (ref 0.00–0.07)
Basophils Absolute: 0.1 10*3/uL (ref 0.0–0.1)
Basophils Relative: 1 %
Eosinophils Absolute: 0.2 10*3/uL (ref 0.0–0.5)
Eosinophils Relative: 2 %
HCT: 26.5 % — ABNORMAL LOW (ref 36.0–46.0)
Hemoglobin: 7.7 g/dL — ABNORMAL LOW (ref 12.0–15.0)
Immature Granulocytes: 0 %
Lymphocytes Relative: 21 %
Lymphs Abs: 2 10*3/uL (ref 0.7–4.0)
MCH: 21.7 pg — ABNORMAL LOW (ref 26.0–34.0)
MCHC: 29.1 g/dL — ABNORMAL LOW (ref 30.0–36.0)
MCV: 74.6 fL — ABNORMAL LOW (ref 80.0–100.0)
Monocytes Absolute: 0.6 10*3/uL (ref 0.1–1.0)
Monocytes Relative: 6 %
Neutro Abs: 6.5 10*3/uL (ref 1.7–7.7)
Neutrophils Relative %: 70 %
Platelets: 266 10*3/uL (ref 150–400)
RBC: 3.55 MIL/uL — ABNORMAL LOW (ref 3.87–5.11)
RDW: 16.3 % — ABNORMAL HIGH (ref 11.5–15.5)
WBC: 9.3 10*3/uL (ref 4.0–10.5)
nRBC: 0 % (ref 0.0–0.2)

## 2022-02-11 LAB — BASIC METABOLIC PANEL
BUN/Creatinine Ratio: 10 — ABNORMAL LOW (ref 12–28)
BUN: 11 mg/dL (ref 8–27)
CO2: 19 mmol/L — ABNORMAL LOW (ref 20–29)
Calcium: 8.8 mg/dL (ref 8.7–10.3)
Chloride: 107 mmol/L — ABNORMAL HIGH (ref 96–106)
Creatinine, Ser: 1.05 mg/dL — ABNORMAL HIGH (ref 0.57–1.00)
Glucose: 65 mg/dL — ABNORMAL LOW (ref 70–99)
Potassium: 4.5 mmol/L (ref 3.5–5.2)
Sodium: 140 mmol/L (ref 134–144)
eGFR: 59 mL/min/{1.73_m2} — ABNORMAL LOW (ref >59)

## 2022-02-11 LAB — IRON AND TIBC
Iron: 23 ug/dL — ABNORMAL LOW (ref 28–170)
Saturation Ratios: 6 % — ABNORMAL LOW (ref 10.4–31.8)
TIBC: 399 ug/dL (ref 250–450)
UIBC: 376 ug/dL

## 2022-02-11 LAB — FERRITIN: Ferritin: 7 ng/mL — ABNORMAL LOW (ref 11–307)

## 2022-02-11 MED ORDER — FUROSEMIDE 20 MG PO TABS: 20.0000 mg | ORAL_TABLET | Freq: Every day | ORAL | 1 refills | Status: DC

## 2022-02-11 MED ORDER — CYANOCOBALAMIN 1000 MCG/ML IJ SOLN: 1000.0000 ug | INTRAMUSCULAR | Status: DC

## 2022-02-11 NOTE — Progress Notes (Signed)
Pt c/o left knee pain: 8 pain level and does not seem to be improving not bale to walk long. Severe back pain interfering with daily life.  ?

## 2022-02-11 NOTE — Telephone Encounter (Signed)
Called patient and informed her of her lab results. Patient verbalized understanding. Sent in a prescription for Lasix 20 MG tablets per patient request as the 40 MG tablets are hard to cut in half as they are very small. ?

## 2022-02-11 NOTE — Telephone Encounter (Signed)
-----   Message from Kate Sable, MD sent at 02/11/2022 11:11 AM EST ----- ?Potassium levels are normal, no indication to replete potassium.  Creatinine 1.05, okay to reduce Lasix to 20 mg daily. ?

## 2022-02-13 ENCOUNTER — Encounter: Payer: Self-pay | Admitting: Oncology

## 2022-02-13 NOTE — Progress Notes (Signed)
Hematology/Oncology Consult note [facility]  Telephone:([phone] [phone]  Patient Care Team: [name] as PCP - General   Name of the patient: Traci Duran  [id number]  07/30/1956   Date of visit: 02/13/22  Diagnosis-and B12 deficiency anemia  Chief complaint/ Reason for visit-routine follow-up of iron and B12 deficiency anemia  Heme/Onc history: Patient is a 65 year old female referred for iron deficiency anemia.  Most recent CBC from 07/16/2020 showed white count of 7.9, H&H of 7.2/25.6 with an MCV of 74.9 and a platelet count of 280.  Ferritin levels were low at 19 and iron studies showed low iron saturation of 4%.  B12 was low at 187.  Folate was normal.  She also underwent small bowel endoscopy for obscure GI bleeding which showed no evidence of any pathology in proximal jejunum medium sized hiatal hernia she has previously undergone EGD and colonoscopy in October and November 2020 as well she is here for consideration of IV iron.  Patient has tolerated IV iron well in the past  Interval history-currently patient reports ongoingLeft knee pain as well as chronic low back pain.  Reports ongoing fatigue.  Denies any blood loss in her stool or urine.  ECOG PS- 1 Pain scale- 5   Review of systems- Review of Systems  Constitutional:  Positive for malaise/fatigue. Negative for chills, fever and weight loss.  HENT:  Negative for congestion, ear discharge and nosebleeds.   Eyes:  Negative for blurred vision.  Respiratory:  Negative for cough, hemoptysis, sputum production, shortness of breath and wheezing.   Cardiovascular:  Negative for chest pain, palpitations, orthopnea and claudication.  Gastrointestinal:  Negative for abdominal pain, blood in stool, constipation, diarrhea, heartburn, melena, nausea and vomiting.  Genitourinary:  Negative for dysuria, flank pain, frequency, hematuria and urgency.  Musculoskeletal:   Positive for joint pain. Negative for back pain and myalgias.  Skin:  Negative for rash.  Neurological:  Negative for dizziness, tingling, focal weakness, seizures, weakness and headaches.  Endo/Heme/Allergies:  Does not bruise/bleed easily.  Psychiatric/Behavioral:  Negative for depression and suicidal ideas. The patient does not have insomnia.     Allergies  Allergen Reactions   Chantix [Varenicline] Hives, Shortness Of Breath and Palpitations   Diphenhydramine Hcl Shortness Of Breath   Potassium Itching and Swelling   Benadryl [Diphenhydramine Hcl] Rash   Niacin Rash   Trazodone Palpitations     Past Medical History:  Diagnosis Date   Anemia 05/2020   Thought to be related to GI bleed.   Anxiety    Bilateral carotid artery stenosis without cerebral infarction 10/2010    November 2011, CTA of the head and neck: Near complete occlusion versus severe stenosis of nearly the entire basilar artery, complete occlusion of the right vertebral artery from its origin off the right SCA, moderate noncalcified atherosclerotic in the proximal left SCA which results in a 40% stenosis. Right ICA with approximately 15% stenosis, left   Nov. 2012 Carotid Duplex: >75% diameter re   CHF (congestive heart failure), chronic HFrEF NYHA class II, chronic, diastolic (HCC)    Chronic HFrEF   COPD (chronic obstructive pulmonary disease) (HCC)    Coronary artery disease, non-occlusive 10/2009   Cardiac Cath: EF 85%;  40% prox &mid RCA -> 80% RPDA and AMrg, 60% RPAV.  40% prox & distal LAD w. 60% dLAD and D2, - Med Rx; Myoview 9/19: Small size, Mod severity Apical Anterior defect  c/w ISCHEMIA. -> INTERMEDIATE RISK (  med Rx because of anemia, and known distal LAD and PDA disease. ->  Similar defect noted in August 2009)   Depression    Gastritis    GERD (gastroesophageal reflux disease)    Hemorrhoids    Hyperlipidemia    Hypertension    Mild aortic stenosis by prior echocardiogram 06/2020   Mild aortic  stenosis by echo   Moderate to severe mitral regurgitation 06/2020   Echo at [facility]: Moderate-severe MR with mild RV enlargement and severely elevated PA 95 mmHg.   Myocardial infarction [facility])    Demand ischemia infarction initially in 2010   PAD (peripheral artery disease) (HCC) 07/2008   a) 8/'09: normal ABIs; b) post procedure Dopplers December 2010: Suggestion of left PFA/CFA-SFV AV fistula.; c) 02/2010: Left CFA-PFA AV fistula still present.; d) 04/2010: CTA Abd/Pelvis- LE Runoff: Extensive Mixed plaque in the Abd Ao.  Patent visceral As.  Bilat 2V runnoff-feet ATA & Peroneal A;  (confirmed by Arteriogram 03/2011); e) ABIs October 2017 R ABI 0.88, L ABI 0.79.   Pulmonary hypertension (HCC) 06/2020   [facility] echo shows moderate-severe MR, moderate to severe TR with RVP estimated 95 mmHg.   Stenosis of left subclavian artery (HCC) 10/27/2010   ~75% by recent doppler -- CHECK BP ON R ARM (or bilaterally for confirmation)     Past Surgical History:  Procedure Laterality Date   APPENDECTOMY     COLONOSCOPY WITH PROPOFOL N/A 06/09/2017   Procedure: COLONOSCOPY WITH PROPOFOL;  Surgeon: Midge Minium, MD;  Location: [facility] ENDOSCOPY;  Service: Endoscopy;  Laterality: N/A;   COLONOSCOPY WITH PROPOFOL N/A 10/08/2019   Procedure: COLONOSCOPY WITH PROPOFOL;  Surgeon: Midge Minium, MD;  Location: [facility] ENDOSCOPY;  Service: Endoscopy;  Laterality: N/A;   ENTEROSCOPY N/A 10/08/2019   Procedure: ENTEROSCOPY;  Surgeon: Midge Minium, MD;  Location: [facility] ENDOSCOPY;  Service: Endoscopy;  Laterality: N/A;   ENTEROSCOPY N/A 07/18/2020   Procedure: ENTEROSCOPY;  Surgeon: Toney Reil, MD;  Location: [facility] ENDOSCOPY;  Service: Gastroenterology;  Laterality: N/A;   ESOPHAGOGASTRODUODENOSCOPY N/A 07/30/2017   Procedure: ESOPHAGOGASTRODUODENOSCOPY (EGD);  Surgeon: Toney Reil, MD;  Location: [facility] ENDOSCOPY;  Service: Gastroenterology;  Laterality: N/A;   ESOPHAGOGASTRODUODENOSCOPY (EGD) WITH PROPOFOL N/A  04/09/2017   Procedure: ESOPHAGOGASTRODUODENOSCOPY (EGD) WITH PROPOFOL;  Surgeon: Midge Minium, MD;  Location: [facility] ENDOSCOPY;  Service: Endoscopy;  Laterality: N/A;   GIVENS CAPSULE STUDY N/A 10/08/2019   Procedure: GIVENS CAPSULE STUDY;  Surgeon: Midge Minium, MD;  Location: [facility] ENDOSCOPY;  Service: Endoscopy;  Laterality: N/A;   HIP FRACTURE SURGERY     LEFT HEART CATH AND CORONARY ANGIOGRAPHY  10/2009   [facility]: EF 85%;  40% prox &mid RCA -> 80% RPDA and AMrg, 60% RPAV.  40% prox & distal LAD w. 60% dLAD and D2, - Med Rx   LOWER EXTREMITY ARTERIOGRAM Bilateral 03/28/2011   ([facility]) RLE: moderate focal prox. R> CIA stenosis, patent PFA, SFA, CFA, 2 vessel runoff via PT/peroneal, AT proximally occluded. LLE: moderate CFA stenosis, patent PFA, SFA and PA, 2 vessel runoff via AT/peroneal, PT occluded.   NM GATED MYOVIEW ([facility] HX)  08/2018   Small sized mild severity defect in the apical anterior wall concerning for ischemia.  INTERMEDIATE RISK. (Appears to be no change from 2009)   NM MYOVIEW LTD  07/2008   ([facility]): A) 07/2008: Minimal reversible defect in the apex with possible ischemia.  Likely represents artifact.  EF 59% -> Cath: 80% PDA, 60% PAV and distal LAD dZ); B) 08/2010: Lexiscan - >  ischemia the Ant & Inf Apex.  EF 64%.(Med Rx); C) Jan 2013: + Apical Ischemia - unchanged; D) Jan 2016: EF 76%. No Ischemia /Infarct.   TRANSTHORACIC ECHOCARDIOGRAM  11/2009   [facility]) A) December 2010, 2D echo: EF >55%, mild concentric LVH, diastolic dysfunction Gr. I, LVIDd 4.2 cm, LVIDs 2.3 cm, mild TR (peak RVP 28 mmHg).; B) 24-Nov-20112D echo: EF >55%, mild concentric LVH, diastolic dysfunction Gr. I, LVIDd 4.3 cm, LVIDs 1.7 cm, mild PR (peak RVP 25 mmHg).; C) 12/2014 Echo: EF >55%, mild LVH, DD gr. I, LAE 3.9cm, Moderate TR (PRVP 35 mmHg)   TRANSTHORACIC ECHOCARDIOGRAM  06/13/2020   [facility]: EF 60-65%.  Normal LV function.  Indeterminate diastolic pressures.  Mild RV  enlargement with severely elevated PAP estimated 95. moderate biatrial dilation.  Moderate to severe MR.  Moderate to severe TR.  Mild AS.    Social History   Socioeconomic History   Marital status: Widowed    Spouse name: Not on file   Number of children: 3   Years of education: Not on file   Highest education level: Not on file  Occupational History   Not on file  Tobacco Use   Smoking status: Some Days    Packs/day: 1.00    Years: 46.00    Pack years: 46.00    Types: Cigarettes   Smokeless tobacco: Never   Tobacco comments:    0.5PPD 11/19/2021  Vaping Use   Vaping Use: Never used  Substance and Sexual Activity   Alcohol use: No   Drug use: Yes    Types: Marijuana    Comment: monthly   Sexual activity: Not Currently  Other Topics Concern   Not on file  Social History Narrative   She is a widowed mother of at least 3 daughters.  Unfortunately all 3 daughters have deceased.        2015: Her youngest daughter and son-in-law were run over by an [place] train   2017-10-31: Middle daughter died of complications of DKA   03/31/2021: Oldest daughter died cancer after being on palliative care.   She also had a dog that died after being with her for 18 years.   Social Determinants of Health   Financial Resource Strain: Not on file  Food Insecurity: Not on file  Transportation Needs: Not on file  Physical Activity: Not on file  Stress: Not on file  Social Connections: Not on file  Intimate Partner Violence: Not on file    Family History  Problem Relation Age of Onset   Heart failure Mother    Heart attack Mother        mother died at 56 of an MI   Heart attack Father        father died at 64 of an MI   Heart attack Brother        Brother had an MI in his 83s     Current Outpatient Medications:    alprazolam (XANAX) 2 MG tablet, Take 2 mg by mouth 2 (two) times daily. , Disp: , Rfl:    aspirin EC 81 MG tablet, Take 81 mg by mouth daily. Swallow whole., Disp: , Rfl:     atorvastatin (LIPITOR) 80 MG tablet, Take 80 mg by mouth daily., Disp: , Rfl:    busPIRone (BUSPAR) 10 MG tablet, Take 10 mg by mouth 2 (two) times daily. , Disp: , Rfl: 2   carvedilol (COREG) 6.25 MG tablet, Take 6.25 mg  by mouth 2 (two) times daily., Disp: , Rfl:    citalopram (CELEXA) 40 MG tablet, Take 40 mg by mouth daily., Disp: , Rfl:    DULoxetine (CYMBALTA) 60 MG capsule, Take 60 mg by mouth daily., Disp: , Rfl:    ezetimibe (ZETIA) 10 MG tablet, Take 1 tablet (10 mg total) by mouth daily., Disp: 90 tablet, Rfl: 1   ferrous gluconate (FERGON) 324 MG tablet, Take 1 tablet (324 mg total) by mouth daily with breakfast., Disp: 30 tablet, Rfl: 0   furosemide (LASIX) 20 MG tablet, Take 1 tablet (20 mg total) by mouth daily., Disp: 90 tablet, Rfl: 1   gabapentin (NEURONTIN) 300 MG capsule, Take 300 mg by mouth 2 (two) times daily., Disp: , Rfl:    ipratropium-albuterol (DUONEB) 0.5-2.5 (3) MG/3ML SOLN, Take 3 mLs by nebulization every 6 (six) hours as needed., Disp: 360 mL, Rfl: 0   isosorbide mononitrate (IMDUR) 30 MG 24 hr tablet, Take 1 tablet (30 mg total) by mouth daily., Disp: 30 tablet, Rfl: 6   lisinopril (ZESTRIL) 10 MG tablet, Take 1 tablet (10 mg total) by mouth daily., Disp: 90 tablet, Rfl: 1   pantoprazole (PROTONIX) 20 MG tablet, Take 20 mg by mouth daily., Disp: , Rfl:    SYMBICORT 80-4.5 MCG/ACT inhaler, Inhale 2 puffs into the lungs 2 (two) times daily., Disp: , Rfl:    albuterol (PROVENTIL) (2.5 MG/3ML) 0.083% nebulizer solution, Take 3 mLs (2.5 mg total) by nebulization every 6 (six) hours as needed for wheezing or shortness of breath., Disp: 75 mL, Rfl: 0   gabapentin (NEURONTIN) 400 MG capsule, Take 400 mg by mouth 3 (three) times daily. (Patient not taking: Reported on 02/11/2022), Disp: , Rfl:    nitroGLYCERIN (NITROSTAT) 0.4 MG SL tablet, Place 0.4 mg under the tongue as needed. (Patient not taking: Reported on 02/11/2022), Disp: , Rfl:  No current facility-administered  medications for this visit.  Facility-Administered Medications Ordered in Other Visits:    alteplase (CATHFLO ACTIVASE) injection 2 mg, 2 mg, Intracatheter, Once PRN, Creig Hines, MD   cyanocobalamin ((VITAMIN B-12)) injection 1,000 mcg, 1,000 mcg, Intramuscular, Q30 days, Creig Hines, MD, 1,000 mcg at 10/09/20 1449   cyanocobalamin ((VITAMIN B-12)) injection 1,000 mcg, 1,000 mcg, Intramuscular, Q30 days, Creig Hines, MD, 1,000 mcg at 02/19/21 1024   heparin lock flush 100 unit/mL, 500 Units, Intracatheter, Once PRN, Creig Hines, MD   heparin lock flush 100 unit/mL, 250 Units, Intracatheter, Once PRN, Creig Hines, MD   sodium chloride flush (NS) 0.9 % injection 10 mL, 10 mL, Intracatheter, Once PRN, Creig Hines, MD   sodium chloride flush (NS) 0.9 % injection 3 mL, 3 mL, Intracatheter, Once PRN, Creig Hines, MD  Physical exam:  Vitals:   02/11/22 1513  BP: (!) 168/81  Pulse: 73  Resp: 16  SpO2: 98%  Weight: 143 lb 8 oz (65.1 kg)   Physical Exam Constitutional:      General: She is not in acute distress. Cardiovascular:     Rate and Rhythm: Normal rate and regular rhythm.     Heart sounds: Normal heart sounds.  Pulmonary:     Effort: Pulmonary effort is normal.     Breath sounds: Normal breath sounds.  Skin:    General: Skin is warm and dry.  Neurological:     Mental Status: She is alert and oriented to person, place, and time.     CMP Latest Ref Rng & Units 02/11/2022  Glucose 70 - 99 mg/dL 89  BUN 8 - 23 mg/dL 11  Creatinine 1.61 - 0.96 mg/dL 0.45(W)  Sodium 098 - 119 mmol/L 133(L)  Potassium 3.5 - 5.1 mmol/L 3.9  Chloride 98 - 111 mmol/L 104  CO2 22 - 32 mmol/L 23  Calcium 8.9 - 10.3 mg/dL 1.4(N)  Total Protein 6.5 - 8.1 g/dL 6.6  Total Bilirubin 0.3 - 1.2 mg/dL 0.3  Alkaline Phos 38 - 126 U/L 106  AST 15 - 41 U/L 25  ALT 0 - 44 U/L 23   CBC Latest Ref Rng & Units 02/11/2022  WBC 4.0 - 10.5 K/uL 9.3  Hemoglobin 12.0 - 15.0 g/dL 7.7(L)   Hematocrit 36.0 - 46.0 % 26.5(L)  Platelets 150 - 400 K/uL 266    Assessment and plan- Patient is a 66 y.o. female with history of iron and B12 deficiency anemia here for routine follow-up  Patient is currently anemic with an H&H of 7.7/26.5 with an MCV of 74.6Which is a significant drop as compared to 10.7 8 months ago.  Ferritin levels are presently low at 7 with an iron saturation of 6%.  We will therefore proceed with 5 doses of Venofer at this time.  Repeat CBC ferritin and iron studies in 2 and 4 months and I will see her back in 4 months  I will also reach out to GI Dr. Midge Minium to see if any other additional intervention is required from his end   Visit Diagnosis 1. Iron deficiency anemia due to chronic blood loss      Dr. Owens Shark, MD, MPH [facility] [phone] 02/13/2022 9:08 AM

## 2022-02-14 ENCOUNTER — Other Ambulatory Visit: Payer: Self-pay

## 2022-02-14 ENCOUNTER — Inpatient Hospital Stay: Payer: Medicare Other

## 2022-02-14 VITALS — BP 129/80 | HR 67 | Temp 97.5°F | Resp 16

## 2022-02-14 DIAGNOSIS — D500 Iron deficiency anemia secondary to blood loss (chronic): Secondary | ICD-10-CM | POA: Diagnosis not present

## 2022-02-14 MED ORDER — SODIUM CHLORIDE 0.9 % IV SOLN
300.0000 mg | INTRAVENOUS | Status: DC
  Administered 2022-02-14: 300 mg via INTRAVENOUS
  Filled 2022-02-14: qty 15

## 2022-02-14 MED ORDER — SODIUM CHLORIDE 0.9 % IV SOLN
Freq: Once | INTRAVENOUS | Status: AC
  Filled 2022-02-14: qty 250

## 2022-02-17 ENCOUNTER — Encounter: Payer: Self-pay | Admitting: Pulmonary Disease

## 2022-02-17 ENCOUNTER — Ambulatory Visit (INDEPENDENT_AMBULATORY_CARE_PROVIDER_SITE_OTHER): Payer: Medicare Other | Admitting: Pulmonary Disease

## 2022-02-17 ENCOUNTER — Other Ambulatory Visit: Payer: Self-pay

## 2022-02-17 VITALS — BP 130/72 | HR 79 | Temp 98.1°F | Ht 67.0 in | Wt 142.2 lb

## 2022-02-17 DIAGNOSIS — F17210 Nicotine dependence, cigarettes, uncomplicated: Secondary | ICD-10-CM

## 2022-02-17 DIAGNOSIS — I340 Nonrheumatic mitral (valve) insufficiency: Secondary | ICD-10-CM | POA: Diagnosis not present

## 2022-02-17 DIAGNOSIS — J449 Chronic obstructive pulmonary disease, unspecified: Secondary | ICD-10-CM

## 2022-02-17 NOTE — Patient Instructions (Signed)
Continue using your inhaler. ? ?Primary care doctor can refill your inhaler. ? ?We will see you here on an as-needed basis. ?

## 2022-02-17 NOTE — Progress Notes (Signed)
Subjective:    Patient ID: Traci Duran, female    DOB: [birth year], 66 y.o.   MRN: [id number]  Patient Care Team: [name] as PCP - General  Chief Complaint  Patient presents with   Follow-up    No current sx.    HPI Patient is a 66 year old current smoker who presents for follow-up on the issue of COPD. She was previously seen here in 19 November 2021. She has continued to fail to follow through with PFTs requested.  Feels that she has "too many doctors".  She does not endorse much on the issue of shortness of breath though she does note this with exertion. She occasionally has cough productive of clear sputum particularly in the mornings. No hemoptysis. She does not endorse any chest pain, orthopnea, paroxysmal nocturnal dyspnea nor lower extremity edema. She does note however that if she does not take her standing Lasix she will get lower extremity edema. She is not a very forthcoming historian.  She is on Symbicort and as needed albuterol and feels that this helps her.  She also has DuoNeb available if needed though has not required it lately.  She has chronic heart failure with preserved ejection fraction.  Follows with cardiology for this.  Review of Systems A 10 point review of systems was performed and it is as noted above otherwise negative.  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   Benzodiazepine abuse (HCC) 09/03/2018   Moderate recurrent major depression (HCC)  09/03/2018   Hyponatremia 10/11/2017   Chest pain 07/28/2017   Acute blood loss anemia 07/28/2017   Demand ischemia (HCC)    Benign neoplasm of descending colon    Polyp of sigmoid colon    Age related osteoporosis 04/16/2017   Iron deficiency anemia secondary to blood loss (chronic)    GI bleed 04/06/2017   Diastolic dysfunction 09/30/2016   Opioid contract exists 06/27/2016   Incidental lung nodule 01/18/2016   Traumatic hemorrhage of cerebrum 12/04/2015   Hx of traumatic brain injury 12/04/2015   Hypovitaminosis D 08/29/2015   Thyroid lesion 08/29/2015   Major depressive disorder, single episode, unspecified 08/27/2015   Basal ganglia hemorrhage (HCC) 08/25/2015   Gastroesophageal reflux disease 01/26/2015   Osteopenia 01/26/2015   Anemia, unspecified 12/27/2014   History of fall 12/27/2014   Stenosis of left subclavian artery (HCC) 09/26/2014   Greater tuberosity of humerus fracture 10/24/2013   AVF (arteriovenous fistula) (HCC) 11/08/2011   Anxiety 10/24/2011   Cerebrovascular disease 10/24/2011   Dyslipidemia 10/24/2011   Essential hypertension - check pressures on R arm 10/24/2011   Tobacco abuse 10/24/2011   Palpitations 10/24/2011   Renal artery stenosis (HCC) 10/24/2011   Unstable angina (HCC) 10/14/2011   Hyperlipidemia LDL goal <70    Chronic obstructive pulmonary disease (HCC)    Depression    PAD (peripheral artery disease) (HCC)    Coronary artery disease, non-occlusive    Bilateral carotid artery stenosis without cerebral infarction 10/2010   Allergies  Allergen Reactions  Chantix [Varenicline] Hives, Shortness Of Breath and Palpitations   Diphenhydramine Hcl Shortness Of Breath   Potassium Itching and Swelling   Benadryl [Diphenhydramine Hcl] Rash   Niacin Rash   Trazodone Palpitations   Social History   Tobacco Use   Smoking status: Every Day    Packs/day: 1.00    Years: 46.00    Pack years: 46.00    Types: Cigarettes   Smokeless tobacco:  Never   Tobacco comments:    0.5PPD 02/17/2022  Substance Use Topics   Alcohol use: No   Current Meds  Medication Sig   albuterol (PROVENTIL) (2.5 MG/3ML) 0.083% nebulizer solution Take 3 mLs (2.5 mg total) by nebulization every 6 (six) hours as needed for wheezing or shortness of breath.   alprazolam (XANAX) 2 MG tablet Take 2 mg by mouth 2 (two) times daily.    aspirin EC 81 MG tablet Take 81 mg by mouth daily. Swallow whole.   atorvastatin (LIPITOR) 80 MG tablet Take 80 mg by mouth daily.   busPIRone (BUSPAR) 10 MG tablet Take 10 mg by mouth 2 (two) times daily.    carvedilol (COREG) 6.25 MG tablet Take 6.25 mg by mouth 2 (two) times daily.   citalopram (CELEXA) 40 MG tablet Take 40 mg by mouth daily.   DULoxetine (CYMBALTA) 60 MG capsule Take 60 mg by mouth daily.   ezetimibe (ZETIA) 10 MG tablet Take 1 tablet (10 mg total) by mouth daily.   ferrous gluconate (FERGON) 324 MG tablet Take 1 tablet (324 mg total) by mouth daily with breakfast.   furosemide (LASIX) 20 MG tablet Take 1 tablet (20 mg total) by mouth daily.   gabapentin (NEURONTIN) 400 MG capsule Take 400 mg by mouth 3 (three) times daily.   ipratropium-albuterol (DUONEB) 0.5-2.5 (3) MG/3ML SOLN Take 3 mLs by nebulization every 6 (six) hours as needed.   isosorbide mononitrate (IMDUR) 30 MG 24 hr tablet Take 1 tablet (30 mg total) by mouth daily.   lisinopril (ZESTRIL) 10 MG tablet Take 1 tablet (10 mg total) by mouth daily.   nitroGLYCERIN (NITROSTAT) 0.4 MG SL tablet Place 0.4 mg under the tongue as needed.   pantoprazole (PROTONIX) 20 MG tablet Take 20 mg by mouth daily.   SYMBICORT 80-4.5 MCG/ACT inhaler Inhale 2 puffs into the lungs 2 (two) times daily.   [DISCONTINUED] gabapentin (NEURONTIN) 300 MG capsule Take 300 mg by mouth 2 (two) times daily.   Immunization History  Administered Date(s) Administered   Influenza, Seasonal, Injecte, Preservative Fre 10/16/2006   Influenza,inj,Quad PF,6+ Mos 01/24/2015, 09/13/2015,  09/15/2016, 11/20/2018   Influenza-Unspecified 01/24/2015, 09/13/2015, 09/15/2016, 09/29/2017, 11/20/2018   Pneumococcal Conjugate-13 10/06/2017   Pneumococcal Polysaccharide-23 10/15/2011, 05/23/2015, 10/09/2019   Tdap 07/03/2011, 11/28/2014, 01/24/2015, 01/05/2016       Objective:   Physical Exam BP 130/72 (BP Location: Left Arm, Cuff Size: Normal)   Pulse 79   Temp 98.1 F (36.7 C) (Temporal)   Ht 5\' 7"  (1.702 m)   Wt 142 lb 3.2 oz (64.5 kg)   SpO2 93%   BMI 22.27 kg/m   SpO2: 93 % O2 Device: None (Room air)  GENERAL: Chronically ill-appearing woman, well-developed well-nourished, no acute distress.  Fully ambulatory. HEAD: Normocephalic, atraumatic.  EYES: Pupils equal, round, reactive to light.  No scleral icterus.  MOUTH: Nose/mouth/throat not examined due to masking requirements for COVID 19. NECK: Supple. No thyromegaly. Trachea midline. No JVD.  No adenopathy. PULMONARY: Good air entry bilaterally.  No adventitious sounds. CARDIOVASCULAR: S1 and  S2. Regular rate and rhythm.  Grade 2/6 mitral regurgitation murmur. ABDOMEN: Benign. MUSCULOSKELETAL: No joint deformity, no clubbing, no edema.  Distal pulses diminished but present. NEUROLOGIC: No focal deficit, no gait disturbance, speech is fluent. SKIN: Intact,warm,dry. PSYCH: Anxious, distractible.  Memory impairment apparent.      Assessment & Plan:     ICD-10-CM   1. COPD suggested by initial evaluation (HCC)  J44.9    She has failed to follow through with PFT's that have been requested Continue Symbicort and as needed albuterol Continue as needed DuoNeb    2. Moderate to severe mitral regurgitation  I34.0    Follows with cardiology for this issue This issue adds complexity to her management    3. Tobacco dependence due to cigarettes  F17.210    Patient counseled regards discontinuation of smoking Total counseling time 3 to 5 minutes     The patient is interested in continuing follow-up with primary  care.  Trying to limit doctor visits.  She has failed to follow through with requested pulmonary testing.  She gets her imaging mostly through the [facility] system.  Recommend that she be enrolled in cancer screening.  This can be ordered through primary care.  Refills on her medications can be also ordered through primary care.  We will see her here on an as-needed basis.  Gailen Shelter, MD Advanced Bronchoscopy PCCM [facility]    *This note was dictated using voice recognition software/Dragon.  Despite best efforts to proofread, errors can occur which can change the meaning. Any transcriptional errors that result from this process are unintentional and may not be fully corrected at the time of dictation.

## 2022-02-18 ENCOUNTER — Inpatient Hospital Stay: Payer: Medicare Other

## 2022-02-18 VITALS — BP 136/58 | HR 72 | Temp 97.0°F | Resp 19

## 2022-02-18 DIAGNOSIS — D500 Iron deficiency anemia secondary to blood loss (chronic): Secondary | ICD-10-CM | POA: Diagnosis not present

## 2022-02-18 MED ORDER — SODIUM CHLORIDE 0.9 % IV SOLN
300.0000 mg | INTRAVENOUS | Status: DC
  Administered 2022-02-18: 300 mg via INTRAVENOUS
  Filled 2022-02-18: qty 300

## 2022-02-18 MED ORDER — SODIUM CHLORIDE 0.9 % IV SOLN
Freq: Once | INTRAVENOUS | Status: AC
  Filled 2022-02-18: qty 250

## 2022-02-18 NOTE — Patient Instructions (Signed)

## 2022-02-21 ENCOUNTER — Inpatient Hospital Stay: Payer: Medicare Other

## 2022-02-21 ENCOUNTER — Telehealth: Payer: Self-pay | Admitting: *Deleted

## 2022-02-21 NOTE — Telephone Encounter (Signed)
Patient called and left vm. Needs to cnl her iron infusion today as she is not feeling well. ? ?I returned the patient's phone call. Pt thanked me for claling her back. Apt cnl per her request. She already has an iron infusion scheduled for next Thursday. I asked the patient to keep this apt as scheduled. ? ?She will need another iron infusion added for the week following as the doctor has ordered a total of 4 infusions. Patient aware that she would need to have the iron infusion r/s around the 30th of March.  She will get this additional apt next week ? ?Scheduling please reschedule an additional treatment around March 30th. ?

## 2022-02-26 ENCOUNTER — Other Ambulatory Visit: Payer: Self-pay

## 2022-02-26 MED ORDER — EZETIMIBE 10 MG PO TABS
10.0000 mg | ORAL_TABLET | Freq: Every day | ORAL | 1 refills | Status: DC
Start: 2022-02-26 — End: 2022-03-11

## 2022-02-27 ENCOUNTER — Other Ambulatory Visit: Payer: Self-pay

## 2022-02-27 ENCOUNTER — Inpatient Hospital Stay: Payer: Medicare Other

## 2022-02-27 VITALS — BP 113/60 | HR 69 | Temp 96.2°F | Resp 18

## 2022-02-27 DIAGNOSIS — D500 Iron deficiency anemia secondary to blood loss (chronic): Secondary | ICD-10-CM

## 2022-02-27 MED ORDER — SODIUM CHLORIDE 0.9 % IV SOLN
Freq: Once | INTRAVENOUS | Status: AC
  Filled 2022-02-27: qty 250

## 2022-02-27 MED ORDER — SODIUM CHLORIDE 0.9 % IV SOLN
300.0000 mg | INTRAVENOUS | Status: DC
  Administered 2022-02-27: 300 mg via INTRAVENOUS
  Filled 2022-02-27: qty 300

## 2022-02-27 NOTE — Patient Instructions (Signed)
[facility]  Discharge Instructions: ?Thank you for choosing [facility] to provide your oncology and hematology care.  ?If you have a lab appointment with the Cancer Center, please go directly to the Cancer Center and check in at the registration area. ? ?Wear comfortable clothing and clothing appropriate for easy access to any Portacath or PICC line.  ? ?We strive to give you quality time with your provider. You may need to reschedule your appointment if you arrive late (15 or more minutes).  Arriving late affects you and other patients whose appointments are after yours.  Also, if you miss three or more appointments without notifying the office, you may be dismissed from the clinic at the provider?s discretion.    ?  ?For prescription refill requests, have your pharmacy contact our office and allow 72 hours for refills to be completed.   ? ?Today you received the following chemotherapy and/or immunotherapy agents VENOFER    ?  ?To help prevent nausea and vomiting after your treatment, we encourage you to take your nausea medication as directed. ? ?BELOW ARE SYMPTOMS THAT SHOULD BE REPORTED IMMEDIATELY: ?*FEVER GREATER THAN 100.4 F (38 ?C) OR HIGHER ?*CHILLS OR SWEATING ?*NAUSEA AND VOMITING THAT IS NOT CONTROLLED WITH YOUR NAUSEA MEDICATION ?*UNUSUAL SHORTNESS OF BREATH ?*UNUSUAL BRUISING OR BLEEDING ?*URINARY PROBLEMS (pain or burning when urinating, or frequent urination) ?*BOWEL PROBLEMS (unusual diarrhea, constipation, pain near the anus) ?TENDERNESS IN MOUTH AND THROAT WITH OR WITHOUT PRESENCE OF ULCERS (sore throat, sores in mouth, or a toothache) ?UNUSUAL RASH, SWELLING OR PAIN  ?UNUSUAL VAGINAL DISCHARGE OR ITCHING  ? ?Items with * indicate a potential emergency and should be followed up as soon as possible or go to the Emergency Department if any problems should occur. ? ?Please show the CHEMOTHERAPY ALERT CARD or IMMUNOTHERAPY ALERT CARD at check-in to the  Emergency Department and triage nurse. ? ?Should you have questions after your visit or need to cancel or reschedule your appointment, please contact [facility]  [phone] and follow the prompts.  Office hours are 8:00 a.m. to 4:30 p.m. Monday - Friday. Please note that voicemails left after 4:00 p.m. may not be returned until the following business day.  We are closed weekends and major holidays. You have access to a nurse at all times for urgent questions. Please call the main number to the clinic [phone] and follow the prompts. ? ?For any non-urgent questions, you may also contact your provider using MyChart. We now offer e-Visits for anyone 18 and older to request care online for non-urgent symptoms. For details visit [email or web address]. ?  ?Also download the MyChart app! Go to the app store, search "MyChart", open the app, select [facility], and log in with your MyChart username and password. ? ?Due to Covid, a mask is required upon entering the hospital/clinic. If you do not have a mask, one will be given to you upon arrival. For doctor visits, patients may have 1 support person aged 18 or older with them. For treatment visits, patients cannot have anyone with them due to current Covid guidelines and our immunocompromised population.  ? ?Iron Sucrose Injection ?What is this medication? ?IRON SUCROSE (EYE ern SOO krose) treats low levels of iron (iron deficiency anemia) in people with kidney disease. Iron is a mineral that plays an important role in making red blood cells, which carry oxygen from your lungs to the rest of your body. ?This medicine may   be used for other purposes; ask your health care provider or pharmacist if you have questions. ?COMMON BRAND NAME(S): Venofer ?What should I tell my care team before I take this medication? ?They need to know if you have any of these conditions: ?Anemia not caused by low iron levels ?Heart disease ?High levels of  iron in the blood ?Kidney disease ?Liver disease ?An unusual or allergic reaction to iron, other medications, foods, dyes, or preservatives ?Pregnant or trying to get pregnant ?Breast-feeding ?How should I use this medication? ?This medication is for infusion into a vein. It is given in a hospital or clinic setting. ?Talk to your care team about the use of this medication in children. While this medication may be prescribed for children as young as 2 years for selected conditions, precautions do apply. ?Overdosage: If you think you have taken too much of this medicine contact a poison control center or emergency room at once. ?NOTE: This medicine is only for you. Do not share this medicine with others. ?What if I miss a dose? ?It is important not to miss your dose. Call your care team if you are unable to keep an appointment. ?What may interact with this medication? ?Do not take this medication with any of the following: ?Deferoxamine ?Dimercaprol ?Other iron products ?This medication may also interact with the following: ?Chloramphenicol ?Deferasirox ?This list may not describe all possible interactions. Give your health care provider a list of all the medicines, herbs, non-prescription drugs, or dietary supplements you use. Also tell them if you smoke, drink alcohol, or use illegal drugs. Some items may interact with your medicine. ?What should I watch for while using this medication? ?Visit your care team regularly. Tell your care team if your symptoms do not start to get better or if they get worse. You may need blood work done while you are taking this medication. ?You may need to follow a special diet. Talk to your care team. Foods that contain iron include: whole grains/cereals, dried fruits, beans, or peas, leafy green vegetables, and organ meats (liver, kidney). ?What side effects may I notice from receiving this medication? ?Side effects that you should report to your care team as soon as  possible: ?Allergic reactions--skin rash, itching, hives, swelling of the face, lips, tongue, or throat ?Low blood pressure--dizziness, feeling faint or lightheaded, blurry vision ?Shortness of breath ?Side effects that usually do not require medical attention (report to your care team if they continue or are bothersome): ?Flushing ?Headache ?Joint pain ?Muscle pain ?Nausea ?Pain, redness, or irritation at injection site ?This list may not describe all possible side effects. Call your doctor for medical advice about side effects. You may report side effects to FDA at 1-800-FDA-1088. ?Where should I keep my medication? ?This medication is given in a hospital or clinic and will not be stored at home. ?NOTE: This sheet is a summary. It may not cover all possible information. If you have questions about this medicine, talk to your doctor, pharmacist, or health care provider. ?? 2022 Elsevier/Gold Standard (2021-04-19 00:00:00) ? ?

## 2022-03-02 NOTE — Progress Notes (Signed)
Please have this patient come in to discuss any further workup she may need for her continued anemia. ?

## 2022-03-06 ENCOUNTER — Inpatient Hospital Stay: Payer: Medicare Other

## 2022-03-07 ENCOUNTER — Telehealth: Payer: Self-pay

## 2022-03-07 NOTE — Telephone Encounter (Signed)
Pt scheduled for 03/17/2022 [place] ?

## 2022-03-07 NOTE — Telephone Encounter (Signed)
-----   Message from Lucilla Lame, MD sent at 03/02/2022  8:03 AM EDT ----- ? ? ? ?----- Message ----- ?From: Sindy Guadeloupe, MD ?Sent: 02/13/2022   9:14 AM EDT ?To: Lucilla Lame, MD ? ?

## 2022-03-10 ENCOUNTER — Other Ambulatory Visit: Payer: Self-pay | Admitting: Cardiology

## 2022-03-17 ENCOUNTER — Ambulatory Visit (INDEPENDENT_AMBULATORY_CARE_PROVIDER_SITE_OTHER): Payer: Medicare Other | Admitting: Gastroenterology

## 2022-03-17 VITALS — BP 126/73 | HR 61 | Temp 97.7°F | Ht 67.0 in | Wt 141.0 lb

## 2022-03-17 DIAGNOSIS — D509 Iron deficiency anemia, unspecified: Secondary | ICD-10-CM | POA: Diagnosis not present

## 2022-03-17 NOTE — Progress Notes (Signed)
? ? ?Primary Care Physician: [facility] ? ?Primary Gastroenterologist:  Dr. Lucilla Lame ? ?Chief Complaint  ?Patient presents with  ? New Patient (Initial Visit)  ? ? ?HPI: Traci Duran is a 66 y.o. female here with a history of iron deficiency anemia.  The patient has had an EGD and colonoscopy back in 2020 with some AVMs seen and treated.  The patient had a capsule endoscopy in 2020 with a possible bleeding site seen and also a push enteroscopy in 2021.  The patient was seen by hematology for iron infusions and was found to have a ferritin of 7 and was referred back to me to see if there was any further investigation that could be done. ?The patient reports that she is not seeing any blood in her bowel movements. ? ?Past Medical History:  ?Diagnosis Date  ? Anemia 05/2020  ? Thought to be related to GI bleed.  ? Anxiety   ? Bilateral carotid artery stenosis without cerebral infarction 10/2010  ? ? November 2011, CTA of the head and neck: Near complete occlusion versus severe stenosis of nearly the entire basilar artery, complete occlusion of the right vertebral artery from its origin off the right SCA, moderate noncalcified atherosclerotic in the proximal left SCA which results in a 40% stenosis. Right ICA with approximately 15% stenosis, left  ? Nov. 2012 Carotid Duplex: >75% diameter re  ? CHF (congestive heart failure), chronic HFrEF NYHA class II, chronic, diastolic (HCC)   ? Chronic HFrEF  ? COPD (chronic obstructive pulmonary disease) ([place])   ? Coronary artery disease, non-occlusive 10/2009  ? Cardiac Cath: EF 85%;  40% prox &mid RCA -> 80% RPDA and AMrg, 60% RPAV.  40% prox & distal LAD w. 60% dLAD and D2, - Med Rx; Myoview 9/19: Small size, Mod severity Apical Anterior defect  c/w ISCHEMIA. -> INTERMEDIATE RISK (med Rx because of anemia, and known distal LAD and PDA disease. ->  Similar defect noted in August 2009)  ? Depression   ? Gastritis   ? GERD (gastroesophageal reflux disease)    ? Hemorrhoids   ? Hyperlipidemia   ? Hypertension   ? Mild aortic stenosis by prior echocardiogram 06/2020  ? Mild aortic stenosis by echo  ? Moderate to severe mitral regurgitation 06/2020  ? Echo at [facility]: Moderate-severe MR with mild RV enlargement and severely elevated PA 95 mmHg.  ? Myocardial infarction [facility])   ? Demand ischemia infarction initially in 2010  ? PAD (peripheral artery disease) ([place]) 07/2008  ? a) 8/'09: normal ABIs; b) post procedure Dopplers December 2010: Suggestion of left PFA/CFA-SFV AV fistula.; c) 02/2010: Left CFA-PFA AV fistula still present.; d) 04/2010: CTA Abd/Pelvis- LE Runoff: Extensive Mixed plaque in the Abd Ao.  Patent visceral As.  Bilat 2V runnoff-feet ATA & Peroneal A;  (confirmed by Arteriogram 03/2011); e) ABIs October 2017 R ABI 0.88, L ABI 0.79.  ? Pulmonary hypertension ([place]) 06/2020  ? [facility] echo shows moderate-severe MR, moderate to severe TR with RVP estimated 95 mmHg.  ? Stenosis of left subclavian artery ([place]) 10/27/2010  ? ~75% by recent doppler -- CHECK BP ON R ARM (or bilaterally for confirmation)  ? ? ?Current Outpatient Medications  ?Medication Sig Dispense Refill  ? albuterol (PROVENTIL) (2.5 MG/3ML) 0.083% nebulizer solution Take 3 mLs (2.5 mg total) by nebulization every 6 (six) hours as needed for wheezing or shortness of breath. 75 mL 0  ? alprazolam (XANAX) 2 MG tablet Take 2 mg  by mouth 2 (two) times daily.     ? aspirin EC 81 MG tablet Take 81 mg by mouth daily. Swallow whole.    ? atorvastatin (LIPITOR) 80 MG tablet Take 80 mg by mouth daily.    ? busPIRone (BUSPAR) 10 MG tablet Take 10 mg by mouth 2 (two) times daily.   2  ? carvedilol (COREG) 6.25 MG tablet Take 6.25 mg by mouth 2 (two) times daily.    ? citalopram (CELEXA) 40 MG tablet Take 40 mg by mouth daily.    ? DULoxetine (CYMBALTA) 60 MG capsule Take 60 mg by mouth daily.    ? ezetimibe (ZETIA) 10 MG tablet TAKE ONE TABLET BY MOUTH EVERY DAY 90 tablet 1  ? ferrous gluconate (FERGON) 324 MG tablet  Take 1 tablet (324 mg total) by mouth daily with breakfast. 30 tablet 0  ? furosemide (LASIX) 20 MG tablet Take 1 tablet (20 mg total) by mouth daily. 90 tablet 1  ? gabapentin (NEURONTIN) 400 MG capsule Take 400 mg by mouth 3 (three) times daily.    ? ipratropium-albuterol (DUONEB) 0.5-2.5 (3) MG/3ML SOLN Take 3 mLs by nebulization every 6 (six) hours as needed. 360 mL 0  ? isosorbide mononitrate (IMDUR) 30 MG 24 hr tablet Take 1 tablet (30 mg total) by mouth daily. 30 tablet 6  ? lisinopril (ZESTRIL) 10 MG tablet TAKE ONE TABLET BY MOUTH EVERY DAY 90 tablet 2  ? nitroGLYCERIN (NITROSTAT) 0.4 MG SL tablet Place 0.4 mg under the tongue as needed.    ? pantoprazole (PROTONIX) 20 MG tablet Take 20 mg by mouth daily.    ? SYMBICORT 80-4.5 MCG/ACT inhaler Inhale 2 puffs into the lungs 2 (two) times daily.    ? ?No current facility-administered medications for this visit.  ? ?Facility-Administered Medications Ordered in Other Visits  ?Medication Dose Route Frequency Provider Last Rate Last Admin  ? alteplase (CATHFLO ACTIVASE) injection 2 mg  2 mg Intracatheter Once PRN Sindy Guadeloupe, MD      ? cyanocobalamin ((VITAMIN B-12)) injection 1,000 mcg  1,000 mcg Intramuscular Q30 days Sindy Guadeloupe, MD   1,000 mcg at 10/09/20 1449  ? cyanocobalamin ((VITAMIN B-12)) injection 1,000 mcg  1,000 mcg Intramuscular Q30 days Sindy Guadeloupe, MD   1,000 mcg at 02/19/21 1024  ? heparin lock flush 100 unit/mL  500 Units Intracatheter Once PRN Sindy Guadeloupe, MD      ? heparin lock flush 100 unit/mL  250 Units Intracatheter Once PRN Sindy Guadeloupe, MD      ? sodium chloride flush (NS) 0.9 % injection 10 mL  10 mL Intracatheter Once PRN Sindy Guadeloupe, MD      ? sodium chloride flush (NS) 0.9 % injection 3 mL  3 mL Intracatheter Once PRN Sindy Guadeloupe, MD      ? ? ?Allergies as of 03/17/2022 - Review Complete 03/17/2022  ?Allergen Reaction Noted  ? Chantix [varenicline] Hives, Shortness Of Breath, and Palpitations 09/06/2012  ?  Diphenhydramine hcl Shortness Of Breath   ? Potassium Itching and Swelling 11/19/2017  ? Benadryl [diphenhydramine hcl] Rash 10/14/2011  ? Niacin Rash   ? Trazodone Palpitations 05/23/2015  ? ? ?ROS: ? ?General: Negative for anorexia, weight loss, fever, chills, fatigue, weakness. ?ENT: Negative for hoarseness, difficulty swallowing , nasal congestion. ?CV: Negative for chest pain, angina, palpitations, dyspnea on exertion, peripheral edema.  ?Respiratory: Negative for dyspnea at rest, dyspnea on exertion, cough, sputum, wheezing.  ?GI: See history  of present illness. ?GU:  Negative for dysuria, hematuria, urinary incontinence, urinary frequency, nocturnal urination.  ?Endo: Negative for unusual weight change.  ?  ?Physical Examination: ? ? BP 126/73   Pulse 61   Temp 97.7 ?F (36.5 ?C) (Oral)   Ht '5\' 7"'$  (1.702 m)   Wt 141 lb (64 kg)   BMI 22.08 kg/m?  ? ?General: Well-nourished, well-developed in no acute distress.  ?Eyes: No icterus. Conjunctivae pink. ?Lungs: Clear to auscultation bilaterally. Non-labored. ?Heart: Regular rate and rhythm, no murmurs rubs or gallops.  ?Abdomen: Bowel sounds are normal, nontender, nondistended, no hepatosplenomegaly or masses, no abdominal bruits or hernia , no rebound or guarding.   ?Extremities: No lower extremity edema. No clubbing or deformities. ?Neuro: Alert and oriented x 3.  Grossly intact. ?Skin: Warm and dry, no jaundice.   ?Psych: Alert and cooperative, normal mood and affect. ? ?Labs:  ?  ?Imaging Studies: ?No results found. ? ?Assessment and Plan:  ? ?DIERA WIRKKALA is a 66 y.o. y/o female who comes in today with chronic anemia.  The patient had a workup a few years ago but continues to have anemia.  She also had multiple AVMs treated in her colon.  The patient will be set up for repeat EGD and colonoscopy to look for any further sites of bleeding. The patient has been explained the plan and agrees with it. ? ? ? ? ?Lucilla Lame, MD. [facility] ? ? ? Note: This  dictation was prepared with Dragon dictation along with smaller phrase technology. Any transcriptional errors that result from this process are unintentional.  ?

## 2022-04-07 ENCOUNTER — Telehealth: Payer: Self-pay | Admitting: Gastroenterology

## 2022-04-07 NOTE — Telephone Encounter (Signed)
Patient called requesting to reschedule colonoscopy.  ?

## 2022-04-09 NOTE — Telephone Encounter (Signed)
Pt to be r/s to June 22 ? ?New instructions mailed to pt ?

## 2022-04-14 ENCOUNTER — Inpatient Hospital Stay: Payer: Medicare Other | Attending: Oncology

## 2022-04-23 ENCOUNTER — Telehealth: Payer: Self-pay

## 2022-04-23 ENCOUNTER — Telehealth: Payer: Self-pay | Admitting: Gastroenterology

## 2022-04-23 NOTE — Telephone Encounter (Signed)
Pt called left message to cancel procedure on 06/22 ?

## 2022-04-23 NOTE — Telephone Encounter (Signed)
Patient called to cancel procedure called endo and canceled procedure  ?

## 2022-04-24 ENCOUNTER — Other Ambulatory Visit: Payer: Self-pay

## 2022-04-24 DIAGNOSIS — D500 Iron deficiency anemia secondary to blood loss (chronic): Secondary | ICD-10-CM

## 2022-04-24 MED ORDER — NA SULFATE-K SULFATE-MG SULF 17.5-3.13-1.6 GM/177ML PO SOLN: 1.0000 | Freq: Once | ORAL | 0 refills | Status: AC

## 2022-04-24 NOTE — Progress Notes (Signed)
Rescheduled procedure for patient

## 2022-05-13 ENCOUNTER — Ambulatory Visit: Payer: Self-pay | Admitting: *Deleted

## 2022-05-13 NOTE — Telephone Encounter (Signed)
Reason for Disposition  SEVERE chest pain  Answer Assessment - Initial Assessment Questions 1. MECHANISM: "How did the injury happen?"     Fell in bathroom floor 2. ONSET: "When did the injury happen?" (Minutes or hours ago)     2 days ago- 6am 3. LOCATION: "Where on the chest is the injury located?"     Left ribs and back 4. APPEARANCE: "What does the injury look like?"     Unable to see 5. BLEEDING: "Is there any bleeding now? If Yes, ask: How long has it been bleeding?"     no 6. SEVERITY: "Any difficulty with breathing?"     No coughing is painful 7. SIZE: For cuts, bruises, or swelling, ask: "How large is it?" (e.g., inches or centimeters)     No- feels swollen- but can't tell 8. PAIN: "Is there pain?" If Yes, ask: "How bad is the pain?"   (e.g., Scale 1-10; or mild, moderate, severe)     No pain if still- 10 if moves 9. TETANUS: For any breaks in the skin, ask: "When was the last tetanus booster?"       10. PREGNANCY: "Is there any chance you are pregnant?" "When was your last menstrual period?"  Protocols used: Chest Injury-A-AH

## 2022-05-13 NOTE — Telephone Encounter (Signed)
  Chief Complaint: rib pain from fall Symptoms: pain with movement- rating 10 Frequency: fell 2 days ago Pertinent Negatives: Patient denies trouble breathing Disposition: '[x]'$ ED /'[]'$ Urgent Care (no appt availability in office) / '[]'$ Appointment(In office/virtual)/ '[]'$  [facility]/ '[]'$ Home Care/ '[]'$ Refused Recommended Disposition /'[]'$ [facility]/ '[]'$  Follow-up with PCP Additional Notes:

## 2022-05-14 ENCOUNTER — Emergency Department: Payer: Medicare Other

## 2022-05-14 ENCOUNTER — Emergency Department
Admission: EM | Admit: 2022-05-14 | Discharge: 2022-05-14 | Disposition: A | Discharge status: Home / Self Care | Payer: Medicare Other | Attending: Emergency Medicine | Admitting: Emergency Medicine

## 2022-05-14 ENCOUNTER — Other Ambulatory Visit: Payer: Self-pay

## 2022-05-14 DIAGNOSIS — R0789 Other chest pain: Secondary | ICD-10-CM | POA: Insufficient documentation

## 2022-05-14 DIAGNOSIS — W01198A Fall on same level from slipping, tripping and stumbling with subsequent striking against other object, initial encounter: Secondary | ICD-10-CM | POA: Diagnosis not present

## 2022-05-14 DIAGNOSIS — I2510 Atherosclerotic heart disease of native coronary artery without angina pectoris: Secondary | ICD-10-CM | POA: Diagnosis not present

## 2022-05-14 DIAGNOSIS — I10 Essential (primary) hypertension: Secondary | ICD-10-CM | POA: Diagnosis not present

## 2022-05-14 DIAGNOSIS — W19XXXA Unspecified fall, initial encounter: Secondary | ICD-10-CM

## 2022-05-14 DIAGNOSIS — S299XXA Unspecified injury of thorax, initial encounter: Secondary | ICD-10-CM

## 2022-05-14 MED ORDER — LIDOCAINE 5 % EX PTCH: 1.0000 | MEDICATED_PATCH | Freq: Two times a day (BID) | CUTANEOUS | 0 refills | Status: AC

## 2022-05-14 MED ORDER — OXYCODONE-ACETAMINOPHEN 5-325 MG PO TABS
1.0000 | ORAL_TABLET | Freq: Once | ORAL | Status: AC
  Administered 2022-05-14: 1 via ORAL
  Filled 2022-05-14: qty 1

## 2022-05-14 MED ORDER — OXYCODONE-ACETAMINOPHEN 5-325 MG PO TABS: 1.0000 | ORAL_TABLET | Freq: Four times a day (QID) | ORAL | 0 refills | Status: AC | PRN

## 2022-05-14 NOTE — Discharge Instructions (Addendum)
-  You may utilize the oxycodone/acetaminophen and lidocaine patches as needed for pain.  -It is important that you continue to take deep breaths throughout the day to avoid any complications, such as pneumonia.  -Follow-up with your primary care provider within the next week to ensure improvement in symptoms.  -Return to the emergency department anytime if you begin to experience any new or worsening symptoms.

## 2022-05-14 NOTE — ED Provider Triage Note (Signed)
  Emergency Medicine Provider Triage Evaluation Note  Traci Duran , a 66 y.o.female,  was evaluated in triage.  Pt complains of left sided rib pain. Reports mechanical fall in her bathroon 2 days ago. Pain has gotten worse. Denies any other injuries   Review of Systems  Positive: Left sided rib pain Negative: Denies fever, chest pain, vomiting  Physical Exam   Vitals:   05/14/22 1524  BP: (!) 117/99  Pulse: 69  Resp: 18  Temp: 98 F (36.7 C)  SpO2: 97%   Gen:   Awake, no distress   Resp:  Normal effort  MSK:   Moves extremities without difficulty  Other:  Tenderness along the left side of the chest wall, predominantly lateral aspect.  Medical Decision Making  Given the patient's initial medical screening exam, the following diagnostic evaluation has been ordered. The patient will be placed in the appropriate treatment space, once one is available, to complete the evaluation and treatment. I have discussed the plan of care with the patient and I have advised the patient that an ED physician or mid-level practitioner will reevaluate their condition after the test results have been received, as the results may give them additional insight into the type of treatment they may need.    Diagnostics: CXR  Treatments: oxycodone/acetaminophen   Teodoro Spray, [place] 05/14/22 1527

## 2022-05-14 NOTE — ED Triage Notes (Signed)
Pt comes with c/o trip and fall few days ago. Pt states left sided rib pain since fall.

## 2022-05-14 NOTE — ED Notes (Signed)
Pt signed esignature  d/c inst to pt.   

## 2022-05-14 NOTE — ED Provider Notes (Signed)
[facility] Provider Note    Event Date/Time   First MD Initiated Contact with Patient 05/14/22 1553     (approximate)   History   Chief Complaint Fall   HPI Traci Duran is a 66 y.o. female, history of hyperlipidemia, PAD, CAD, hypertension, depression, tobacco use, presents to the emergency department for evaluation of injury sustained from fall.  She states that she was getting up to go to the restroom 2 days prior when she accidentally slipped and hit the left side of her chest up against the bathtub.  Denies any head injury or LOC.  She states that she thought it is nothing at first, however the pain has worsened over the past couple days on the left side of her chest.  She states it hurts to cough.  Denies fever/chills, shortness of breath, abdominal pain, flank pain, nausea/vomiting, diarrhea, dysuria, headache, dizziness/lightheadedness, or rash/lesions.  History Limitations: No limitations.        Physical Exam  Triage Vital Signs: ED Triage Vitals  Enc Vitals Group     BP 05/14/22 1524 (!) 117/99     Pulse Rate 05/14/22 1524 69     Resp 05/14/22 1524 18     Temp 05/14/22 1524 98 F (36.7 C)     Temp src --      SpO2 05/14/22 1524 97 %     Weight --      Height --      Head Circumference --      Peak Flow --      Pain Score 05/14/22 1523 10     Pain Loc --      Pain Edu? --      Excl. in [place]? --     Most recent vital signs: Vitals:   05/14/22 1524  BP: (!) 117/99  Pulse: 69  Resp: 18  Temp: 98 F (36.7 C)  SpO2: 97%    General: Awake, NAD.  Skin: Warm, dry. No rashes or lesions.  Eyes: PERRL. Conjunctivae normal.  CV: Good peripheral perfusion.  Resp: Normal effort.  Lung sounds are clear bilaterally in the apices and bases. Abd: Soft, non-tender. No distention.  Neuro: At baseline. No gross neurological deficits.   Focused Exam: No erythema, ecchymosis, or lesions present along the left side of the chest wall.   Tenderness noted along the lateral aspect of the left side, particular around ribs 8 through 12.  Physical Exam    ED Results / Procedures / Treatments  Labs (all labs ordered are listed, but only abnormal results are displayed) Labs Reviewed - No data to display   EKG N/A.   RADIOLOGY  ED Provider Interpretation: I personally reviewed this chest x-ray, no evidence of acute fractures or pneumothorax.  DG Chest 2 View  Result Date: 05/14/2022 CLINICAL DATA:  Suspected fractures. Trip and fall a few days ago. Left-sided rib pain since fall. EXAM: CHEST - 2 VIEW COMPARISON:  Chest two views 07/16/2020 FINDINGS: Cardiac silhouette is at the upper limits of normal size. Mediastinal contours are within normal limits. Mild chronic bilateral lower lung interstitial thickening. No focal airspace opacity to indicate pneumonia. No pleural effusion or pneumothorax. Diffuse decreased bone mineralization. Severe anterior height loss of the anterior T4 vertebral body is unchanged and chronic. Within limitations of diffuse decreased bone mineralization, no acute displaced left-sided rib fracture is identified. IMPRESSION: 1. No acute displaced left-sided rib fracture is seen. 2. No acute lung process. Electronically Signed  By: Yvonne Kendall M.D.   On: 05/14/2022 16:00    PROCEDURES:  Critical Care performed: N/A.  Procedures    MEDICATIONS ORDERED IN ED: Medications  oxyCODONE-acetaminophen (PERCOCET/ROXICET) 5-325 MG per tablet 1 tablet (1 tablet Oral Given 05/14/22 1529)  oxyCODONE-acetaminophen (PERCOCET/ROXICET) 5-325 MG per tablet 1 tablet (1 tablet Oral Given 05/14/22 1623)     IMPRESSION / MDM / ASSESSMENT AND PLAN / ED COURSE  I reviewed the triage vital signs and the nursing notes.                              Differential diagnosis includes, but is not limited to, rib fractures, pneumothorax, rib contusion  ED Course Patient appears well, vitals within normal limits.  NAD.   Currently endorsing moderate pain.  We will go ahead treat with oxycodone/acetaminophen.  Assessment/Plan Patient presents with left-sided chest wall pain following mechanical fall 2 days prior.  Chest x-ray shows no evidence of acute nondisplaced fractures or evidence of pneumothorax.  I suspect that she may have some level of rib injury, whether be contusion or subtle rib fractures not picked up on x-ray.  We will provide her with a prescription for lidocaine patches and oxycodone acetaminophen to take as needed while she recovers.  Encouraged her to continue taking deep breaths as she heals to prevent pneumonia or further complications.  Encouraged her to follow-up with her primary care provider within the next week to ensure improvement in her symptoms.  We will plan to discharge.  Patient's presentation is most consistent with acute complicated illness / injury requiring diagnostic workup.  Provided the patient with anticipatory guidance, return precautions, and educational material. Encouraged the patient to return to the emergency department at any time if they begin to experience any new or worsening symptoms. Patient expressed understanding and agreed with the plan.       FINAL CLINICAL IMPRESSION(S) / ED DIAGNOSES   Final diagnoses:  None     Rx / DC Orders   ED Discharge Orders          Ordered    oxyCODONE-acetaminophen (PERCOCET/ROXICET) 5-325 MG tablet  Every 6 hours PRN        05/14/22 1619    lidocaine (LIDODERM) 5 %  Every 12 hours        05/14/22 1619             Note:  This document was prepared using Dragon voice recognition software and may include unintentional dictation errors.   Teodoro Spray, [place] 05/14/22 1703    Vanessa [place], MD 05/15/22 1147

## 2022-05-27 ENCOUNTER — Telehealth: Payer: Self-pay

## 2022-05-27 ENCOUNTER — Other Ambulatory Visit: Payer: Self-pay

## 2022-05-27 DIAGNOSIS — D509 Iron deficiency anemia, unspecified: Secondary | ICD-10-CM

## 2022-05-27 NOTE — Telephone Encounter (Signed)
Patient lvm on 05/26/22 stated her colonoscopy is scheduled with Dr. Allen Norris on 05/29/22 at [facility] however she is in severe pain as she fell and injured her rib.  I lvm asking her to call me back to reschedule her colonoscopy.  Thanks, [place], [place]

## 2022-05-27 NOTE — Telephone Encounter (Signed)
Patient left vm to reschedule colonoscopy. Requesting call back.

## 2022-05-27 NOTE — Telephone Encounter (Signed)
Patients colonoscopy with EGD has been rescheduled to Tuesday 07/29/22 due to broken ribs.  Traci Duran has been informed that patient should have colon with/EGD 07/29/22 with Dr. Allen Norris at [facility].  Thanks, [place], [place]

## 2022-05-27 NOTE — Telephone Encounter (Signed)
She had been already scheduled

## 2022-05-29 ENCOUNTER — Ambulatory Visit: Admission: RE | Admit: 2022-05-29 | Payer: Medicare Other | Source: Ambulatory Visit | Admitting: Gastroenterology

## 2022-05-29 ENCOUNTER — Ambulatory Visit: Admit: 2022-05-29 | Payer: Medicare Other | Admitting: Gastroenterology

## 2022-05-29 SURGERY — COLONOSCOPY WITH PROPOFOL
Anesthesia: General

## 2022-06-12 ENCOUNTER — Other Ambulatory Visit: Payer: Self-pay | Admitting: Pediatrics

## 2022-06-12 DIAGNOSIS — Z780 Asymptomatic menopausal state: Secondary | ICD-10-CM

## 2022-06-17 ENCOUNTER — Encounter: Payer: Self-pay | Admitting: Oncology

## 2022-06-17 ENCOUNTER — Inpatient Hospital Stay: Payer: Medicare Other

## 2022-06-17 ENCOUNTER — Inpatient Hospital Stay: Payer: Medicare Other | Admitting: Oncology

## 2022-07-10 ENCOUNTER — Ambulatory Visit (INDEPENDENT_AMBULATORY_CARE_PROVIDER_SITE_OTHER): Payer: Medicare Other | Admitting: Vascular Surgery

## 2022-07-10 ENCOUNTER — Encounter (INDEPENDENT_AMBULATORY_CARE_PROVIDER_SITE_OTHER): Payer: Medicare Other

## 2022-07-14 ENCOUNTER — Other Ambulatory Visit: Payer: Medicare Other

## 2022-07-28 ENCOUNTER — Telehealth: Payer: Self-pay | Admitting: Gastroenterology

## 2022-07-28 NOTE — Telephone Encounter (Signed)
Patient calling to cancel colonoscopy, states she will call later to reschedule.

## 2022-07-29 ENCOUNTER — Encounter: Admission: RE | Payer: Self-pay | Source: Ambulatory Visit

## 2022-07-29 ENCOUNTER — Ambulatory Visit: Admission: RE | Admit: 2022-07-29 | Payer: Medicare Other | Source: Ambulatory Visit | Admitting: Gastroenterology

## 2022-07-29 SURGERY — COLONOSCOPY WITH PROPOFOL
Anesthesia: General

## 2022-08-01 ENCOUNTER — Inpatient Hospital Stay: Payer: Medicare Other | Attending: Oncology

## 2022-08-01 ENCOUNTER — Inpatient Hospital Stay: Payer: Medicare Other | Admitting: Nurse Practitioner

## 2022-08-14 ENCOUNTER — Ambulatory Visit: Payer: Medicare Other | Attending: Cardiology | Admitting: Cardiology

## 2022-08-14 ENCOUNTER — Encounter: Payer: Self-pay | Admitting: Cardiology

## 2022-08-14 VITALS — BP 134/72 | HR 58 | Ht 67.0 in | Wt 146.4 lb

## 2022-08-14 DIAGNOSIS — I340 Nonrheumatic mitral (valve) insufficiency: Secondary | ICD-10-CM | POA: Diagnosis not present

## 2022-08-14 DIAGNOSIS — K922 Gastrointestinal hemorrhage, unspecified: Secondary | ICD-10-CM

## 2022-08-14 DIAGNOSIS — I2510 Atherosclerotic heart disease of native coronary artery without angina pectoris: Secondary | ICD-10-CM

## 2022-08-14 DIAGNOSIS — E782 Mixed hyperlipidemia: Secondary | ICD-10-CM

## 2022-08-14 DIAGNOSIS — I10 Essential (primary) hypertension: Secondary | ICD-10-CM | POA: Diagnosis not present

## 2022-08-14 NOTE — Progress Notes (Signed)
Cardiology Office Note:    Date:  08/14/2022   ID:  Traci Duran, DOB 1956/09/01, MRN [id number]  PCP:  [name]  Cardiologist:  None  Electrophysiologist:  None   Referring MD: [name]*   No chief complaint on file.   History of Present Illness:    Traci Duran is a 66 y.o. female with a hx of CAD (three-vessel coronary calcification on chest CT ), anxiety, COPD, PAD, GI bleed, anemia, current smoker x40+ years, who presents for follow-up.    Being seen due to CAD, denies chest pain, states feeling fatigued.  Endorses having blood in her stool, has a history of GI bleed.  Colonoscopy has been referred by primary care physician, patient states she has been pushing lots of.  States not taking aspirin due to blood in stool.  She still smokes, is working on quitting.   Prior notes  Echo 10/2021 EF 60 to 65%, moderate MR Lexiscan Myoview 08/2018 small apical anterior ischemia Chest CT lung cancer screening 09/26/2017 three-vessel coronary artery calcification.  Invasive work-up not planned due to history of anemia/GI bleeds.  Patient has a history of GI bleed requiring transfusions .   Past Medical History:  Diagnosis Date   Anemia 05/2020   Thought to be related to GI bleed.   Anxiety    Bilateral carotid artery stenosis without cerebral infarction 10/2010    November 2011, CTA of the head and neck: Near complete occlusion versus severe stenosis of nearly the entire basilar artery, complete occlusion of the right vertebral artery from its origin off the right SCA, moderate noncalcified atherosclerotic in the proximal left SCA which results in a 40% stenosis. Right ICA with approximately 15% stenosis, left   Nov. 2012 Carotid Duplex: >75% diameter re   CHF (congestive heart failure), chronic HFrEF NYHA class II, chronic, diastolic (HCC)    Chronic HFrEF   COPD (chronic obstructive pulmonary disease) (HCC)    Coronary artery disease,  non-occlusive 10/2009   Cardiac Cath: EF 85%;  40% prox &mid RCA -> 80% RPDA and AMrg, 60% RPAV.  40% prox & distal LAD w. 60% dLAD and D2, - Med Rx; Myoview 9/19: Small size, Mod severity Apical Anterior defect  c/w ISCHEMIA. -> INTERMEDIATE RISK (med Rx because of anemia, and known distal LAD and PDA disease. ->  Similar defect noted in August 2009)   Depression    Gastritis    GERD (gastroesophageal reflux disease)    Hemorrhoids    Hyperlipidemia    Hypertension    Mild aortic stenosis by prior echocardiogram 06/2020   Mild aortic stenosis by echo   Moderate to severe mitral regurgitation 06/2020   E[facility] at [facility]: Moderate-severe MR with mild RV enlargement and severely elevated PA 95 mmHg.   Myocardial infarction [facility])    Demand ischemia infarction initially in 2010   PAD (peripheral artery disease) (HCC) 07/2008   a) 8/'09: normal ABIs; b) post procedure Dopplers December 2010: Suggestion of left PFA/CFA-SFV AV fistula.; c) 02/2010: Left CFA-PFA AV fistula still present.; d) 04/2010: CTA Abd/Pelvis- LE Runoff: Extensive Mixed plaque in the Abd Ao.  Patent visceral As.  Bilat 2V runnoff-feet ATA & Peroneal A;  (confirmed by Arteriogram 03/2011); e) ABIs October 2017 R ABI 0.88, L ABI 0.79.   Pulmonary hypertension (HCC) 06/2020   [facility] echo shows moderate-severe MR, moderate to severe TR with RVP estimated 95 mmHg.   Stenosis of left subclavian artery (HCC) 10/27/2010   ~  75% by recent doppler -- CHECK BP ON R ARM (or bilaterally for confirmation)    Past Surgical History:  Procedure Laterality Date   APPENDECTOMY     COLONOSCOPY WITH PROPOFOL N/A 06/09/2017   Procedure: COLONOSCOPY WITH PROPOFOL;  Surgeon: Midge Minium, MD;  Location: [facility] ENDOSCOPY;  Service: Endoscopy;  Laterality: N/A;   COLONOSCOPY WITH PROPOFOL N/A 10/08/2019   Procedure: COLONOSCOPY WITH PROPOFOL;  Surgeon: Midge Minium, MD;  Location: [facility] ENDOSCOPY;  Service: Endoscopy;  Laterality: N/A;   ENTEROSCOPY N/A  10/08/2019   Procedure: ENTEROSCOPY;  Surgeon: Midge Minium, MD;  Location: [facility] ENDOSCOPY;  Service: Endoscopy;  Laterality: N/A;   ENTEROSCOPY N/A 07/18/2020   Procedure: ENTEROSCOPY;  Surgeon: Toney Reil, MD;  Location: [facility] ENDOSCOPY;  Service: Gastroenterology;  Laterality: N/A;   ESOPHAGOGASTRODUODENOSCOPY N/A 07/30/2017   Procedure: ESOPHAGOGASTRODUODENOSCOPY (EGD);  Surgeon: Toney Reil, MD;  Location: [facility] ENDOSCOPY;  Service: Gastroenterology;  Laterality: N/A;   ESOPHAGOGASTRODUODENOSCOPY (EGD) WITH PROPOFOL N/A 04/09/2017   Procedure: ESOPHAGOGASTRODUODENOSCOPY (EGD) WITH PROPOFOL;  Surgeon: Midge Minium, MD;  Location: [facility] ENDOSCOPY;  Service: Endoscopy;  Laterality: N/A;   GIVENS CAPSULE STUDY N/A 10/08/2019   Procedure: GIVENS CAPSULE STUDY;  Surgeon: Midge Minium, MD;  Location: [facility] ENDOSCOPY;  Service: Endoscopy;  Laterality: N/A;   HIP FRACTURE SURGERY     LEFT HEART CATH AND CORONARY ANGIOGRAPHY  10/2009   [facility]: EF 85%;  40% prox &mid RCA -> 80% RPDA and AMrg, 60% RPAV.  40% prox & distal LAD w. 60% dLAD and D2, - Med Rx   LOWER EXTREMITY ARTERIOGRAM Bilateral 03/28/2011   ([facility]) RLE: moderate focal prox. R> CIA stenosis, patent PFA, SFA, CFA, 2 vessel runoff via PT/peroneal, AT proximally occluded. LLE: moderate CFA stenosis, patent PFA, SFA and PA, 2 vessel runoff via AT/peroneal, PT occluded.   NM GATED MYOVIEW ([facility] HX)  08/2018   Small sized mild severity defect in the apical anterior wall concerning for ischemia.  INTERMEDIATE RISK. (Appears to be no change from 2009)   NM MYOVIEW LTD  07/2008   ([facility]): A) 07/2008: Minimal reversible defect in the apex with possible ischemia.  Likely represents artifact.  EF 59% -> Cath: 80% PDA, 60% PAV and distal LAD dZ); B) 08/2010: Lexiscan - > ischemia the Ant & Inf Apex.  EF 64%.(Med Rx); C) Jan 2013: + Apical Ischemia - unchanged; D) Jan 2016: EF 76%. No Ischemia /Infarct.   TRANSTHORACIC  ECHOCARDIOGRAM  11/2009   [facility]) A) December 2010, 2D echo: EF >55%, mild concentric LVH, diastolic dysfunction Gr. I, LVIDd 4.2 cm, LVIDs 2.3 cm, mild TR (peak RVP 28 mmHg).; B) November 2011, 2D echo: EF >55%, mild concentric LVH, diastolic dysfunction Gr. I, LVIDd 4.3 cm, LVIDs 1.7 cm, mild PR (peak RVP 25 mmHg).; C) 12/2014 Echo: EF >55%, mild LVH, DD gr. I, LAE 3.9cm, Moderate TR (PRVP 35 mmHg)   TRANSTHORACIC ECHOCARDIOGRAM  06/13/2020   [facility]: EF 60-65%.  Normal LV function.  Indeterminate diastolic pressures.  Mild RV enlargement with severely elevated PAP estimated 95. moderate biatrial dilation.  Moderate to severe MR.  Moderate to severe TR.  Mild AS.    Current Medications: Current Meds  Medication Sig   albuterol (PROVENTIL) (2.5 MG/3ML) 0.083% nebulizer solution Take 3 mLs (2.5 mg total) by nebulization every 6 (six) hours as needed for wheezing or shortness of breath.   alprazolam (XANAX) 2 MG tablet Take 2 mg by mouth 2 (two) times daily.  atorvastatin (LIPITOR) 80 MG tablet Take 80 mg by mouth daily.   busPIRone (BUSPAR) 10 MG tablet Take 10 mg by mouth 2 (two) times daily.    carvedilol (COREG) 6.25 MG tablet Take 6.25 mg by mouth 2 (two) times daily.   citalopram (CELEXA) 40 MG tablet Take 40 mg by mouth daily.   DULoxetine (CYMBALTA) 60 MG capsule Take 60 mg by mouth daily.   ezetimibe (ZETIA) 10 MG tablet TAKE ONE TABLET BY MOUTH EVERY DAY   ferrous gluconate (FERGON) 324 MG tablet Take 1 tablet (324 mg total) by mouth daily with breakfast.   furosemide (LASIX) 20 MG tablet Take 1 tablet (20 mg total) by mouth daily.   gabapentin (NEURONTIN) 400 MG capsule Take 400 mg by mouth 3 (three) times daily.   ipratropium-albuterol (DUONEB) 0.5-2.5 (3) MG/3ML SOLN Take 3 mLs by nebulization every 6 (six) hours as needed.   isosorbide mononitrate (IMDUR) 30 MG 24 hr tablet Take 1 tablet (30 mg total) by mouth daily.   lisinopril (ZESTRIL) 10 MG tablet TAKE ONE  TABLET BY MOUTH EVERY DAY   nitroGLYCERIN (NITROSTAT) 0.4 MG SL tablet Place 0.4 mg under the tongue as needed.   pantoprazole (PROTONIX) 20 MG tablet Take 20 mg by mouth daily.   SYMBICORT 80-4.5 MCG/ACT inhaler Inhale 2 puffs into the lungs 2 (two) times daily.   [DISCONTINUED] aspirin EC 81 MG tablet Take 81 mg by mouth daily. Swallow whole.     Allergies:   Chantix [varenicline], Diphenhydramine hcl, Potassium, Benadryl [diphenhydramine hcl], Niacin, and Trazodone   Social History   Socioeconomic History   Marital status: Widowed    Spouse name: Not on file   Number of children: 3   Years of education: Not on file   Highest education level: Not on file  Occupational History   Not on file  Tobacco Use   Smoking status: Every Day    Packs/day: 1.00    Years: 46.00    Total pack years: 46.00    Types: Cigarettes   Smokeless tobacco: Never   Tobacco comments:    0.5PPD 02/17/2022  Vaping Use   Vaping Use: Never used  Substance and Sexual Activity   Alcohol use: No   Drug use: Yes    Types: Marijuana    Comment: monthly   Sexual activity: Not Currently  Other Topics Concern   Not on file  Social History Narrative   She is a widowed mother of at least 3 daughters.  Unfortunately all 3 daughters have deceased.        2015: Her youngest daughter and son-in-law were run over by an [place] train   2017/10/31: Middle daughter died of complications of DKA   03-31-21: Oldest daughter died cancer after being on palliative care.   She also had a dog that died after being with her for 18 years.   Social Determinants of Health   Financial Resource Strain: Low Risk  (10/11/2017)   Overall Financial Resource Strain (CARDIA)    Difficulty of Paying Living Expenses: Not hard at all  Food Insecurity: No Food Insecurity (10/11/2017)   Hunger Vital Sign    Worried About Running Out of Food in the Last Year: Never true    Ran Out of Food in the Last Year: Never true  Transportation Needs:  No Transportation Needs (10/11/2017)   PRAPARE - Administrator, Civil Service (Medical): No    Lack of Transportation (Non-Medical): No  Physical Activity:  Inactive (10/11/2017)   Exercise Vital Sign    Days of Exercise per Week: 0 days    Minutes of Exercise per Session: 0 min  Stress: Stress Concern Present (10/11/2017)   [employer] of Occupational Health - Occupational Stress Questionnaire    Feeling of Stress : Very much  Social Connections: Unknown (10/11/2017)   Social Connection and Isolation Panel [NHANES]    Frequency of Communication with Friends and Family: Patient refused    Frequency of Social Gatherings with Friends and Family: Patient refused    Attends Religious Services: Patient refused    Database administrator or Organizations: Patient refused    Attends Banker Meetings: Patient refused    Marital Status: Patient refused     Family History: The patient's family history includes Heart attack in her brother, father, and mother; Heart failure in her mother.  ROS:   Please see the history of present illness.     All other systems reviewed and are negative.  EKGs/Labs/Other Studies Reviewed:    The following studies were reviewed today:   EKG:  EKG is  ordered today.  The ekg ordered today demonstrates sinus bradycardia, heart rate 58  Recent Labs: 02/11/2022: ALT 23; BUN 11; Creatinine, Ser 1.06; Hemoglobin 7.7; Platelets 266; Potassium 3.9; Sodium 133  Recent Lipid Panel    Component Value Date/Time   CHOL 214 (H) 10/15/2011 0506   TRIG 225 (H) 10/15/2011 0506   HDL 45 10/15/2011 0506   CHOLHDL 4.8 10/15/2011 0506   VLDL 45 (H) 10/15/2011 0506   LDLCALC 124 (H) 10/15/2011 0506   Outside lipid panel 01/2022 total cholesterol 132, LDL 74.  Physical Exam:    VS:  BP 134/72   Pulse (!) 58   Ht 5\' 7"  (1.702 m)   Wt 146 lb 6.4 oz (66.4 kg)   SpO2 99%   BMI 22.93 kg/m     Wt Readings from Last 3 Encounters:  08/14/22 146  lb 6.4 oz (66.4 kg)  03/17/22 141 lb (64 kg)  02/17/22 142 lb 3.2 oz (64.5 kg)     GEN:  Well nourished, appears fatigued HEENT: Normal NECK: No JVD; No carotid bruits CARDIAC: RRR, 2/6 systolic murmur, rubs, gallops RESPIRATORY:  Clear to auscultation without rales, wheezing or rhonchi  ABDOMEN: Soft, non-tender, non-distended MUSCULOSKELETAL:  No edema; No deformity  SKIN: Warm and dry NEUROLOGIC:  Alert and oriented x 3 PSYCHIATRIC:  Normal affect   ASSESSMENT:    1. Coronary artery disease involving native coronary artery of native heart without angina pectoris   2. Mitral valve insufficiency, unspecified etiology   3. Primary hypertension   4. Mixed hyperlipidemia   5. Gastrointestinal hemorrhage, unspecified gastrointestinal hemorrhage type    PLAN:    In order of problems listed above:  CAD, three-vessel coronary calcification, prior abnormal stress test.  EF 60 to 65%, severe pulmonary hypertension likely WHO class III from COPD.   GI bleeding, stop aspirin, continue Imdur, Coreg, Lipitor 80 mg.  Denies chest pain.  LDL at goal.   Due to history of GI bleeds, requiring transfusions, will refrain from any invasive strategy at this time.  Moderate mitral regurgitation, monitor with serial echocardiograms.  Plan to obtain echo in 1 year. Hypertension, BP controlled, continue Coreg, lisinopril 10, Imdur. Mixed hyperlipidemia, cholesterol controlled.  Continue Lipitor 80 mg daily. GI bleeding, advised to follow-up with PCP regarding colonoscopy and possible GI referral.  Follow-up in 12 months  Total encounter time 40  minutes  Greater than 50% was spent in counseling and coordination of care with the patient    Medication Adjustments/Labs and Tests Ordered: Current medicines are reviewed at length with the patient today.  Concerns regarding medicines are outlined above.  Orders Placed This Encounter  Procedures   EKG 12-Lead   No orders of the defined types were  placed in this encounter.   Patient Instructions  Medication Instructions:   Your physician has recommended you make the following change in your medication:    STOP taking your Aspirin.  *If you need a refill on your cardiac medications before your next appointment, please call your pharmacy*   - You will need to be fasting. Please do not have anything to eat or drink after midnight the morning you have the lab work. You may only have water or black coffee with no cream or sugar.   - Please go to the [facility]. You will check in at the front desk to the right as you walk into the atrium. Valet Parking is offered if needed. - No appointment needed. You may go any day between 7 am and 6 pm.     Follow-Up: At [facility], you and your health needs are our priority.  As part of our continuing mission to provide you with exceptional heart care, we have created designated Provider Care Teams.  These Care Teams include your primary Cardiologist (physician) and Advanced Practice Providers (APPs -  Physician Assistants and Nurse Practitioners) who all work together to provide you with the care you need, when you need it.  We recommend signing up for the patient portal called "MyChart".  Sign up information is provided on this After Visit Summary.  MyChart is used to connect with patients for Virtual Visits (Telemedicine).  Patients are able to view lab/test results, encounter notes, upcoming appointments, etc.  Non-urgent messages can be sent to your provider as well.   To learn more about what you can do with MyChart, go to [email or web address].    Your next appointment:   1 year(s)  The format for your next appointment:   In Person  Provider:   Debbe Odea, MD    Other Instructions   Important Information About Sugar         Signed, Debbe Odea, MD  08/14/2022 12:33 PM    [facility]

## 2022-08-14 NOTE — Patient Instructions (Addendum)
Medication Instructions:   Your physician has recommended you make the following change in your medication:    STOP taking your Aspirin.  *If you need a refill on your cardiac medications before your next appointment, please call your pharmacy*   - You will need to be fasting. Please do not have anything to eat or drink after midnight the morning you have the lab work. You may only have water or black coffee with no cream or sugar.   - Please go to the [facility]. You will check in at the front desk to the right as you walk into the atrium. Valet Parking is offered if needed. - No appointment needed. You may go any day between 7 am and 6 pm.     Follow-Up: At [facility], you and your health needs are our priority.  As part of our continuing mission to provide you with exceptional heart care, we have created designated Provider Care Teams.  These Care Teams include your primary Cardiologist (physician) and Advanced Practice Providers (APPs -  Physician Assistants and Nurse Practitioners) who all work together to provide you with the care you need, when you need it.  We recommend signing up for the patient portal called "MyChart".  Sign up information is provided on this After Visit Summary.  MyChart is used to connect with patients for Virtual Visits (Telemedicine).  Patients are able to view lab/test results, encounter notes, upcoming appointments, etc.  Non-urgent messages can be sent to your provider as well.   To learn more about what you can do with MyChart, go to [email or web address].    Your next appointment:   1 year(s)  The format for your next appointment:   In Person  Provider:   Kate Sable, MD    Other Instructions   Important Information About Sugar

## 2022-09-05 ENCOUNTER — Other Ambulatory Visit: Payer: Self-pay | Admitting: Cardiology

## 2022-10-09 ENCOUNTER — Inpatient Hospital Stay
Admission: EM | Admit: 2022-10-09 | Discharge: 2022-10-12 | DRG: 378 | Disposition: A | Discharge status: Home Health | Payer: Medicare Other | Attending: Internal Medicine | Admitting: Internal Medicine

## 2022-10-09 ENCOUNTER — Other Ambulatory Visit: Payer: Self-pay

## 2022-10-09 ENCOUNTER — Emergency Department: Payer: Medicare Other

## 2022-10-09 DIAGNOSIS — D62 Acute posthemorrhagic anemia: Secondary | ICD-10-CM | POA: Diagnosis present

## 2022-10-09 DIAGNOSIS — I252 Old myocardial infarction: Secondary | ICD-10-CM

## 2022-10-09 DIAGNOSIS — Z5982 Transportation insecurity: Secondary | ICD-10-CM

## 2022-10-09 DIAGNOSIS — D500 Iron deficiency anemia secondary to blood loss (chronic): Secondary | ICD-10-CM

## 2022-10-09 DIAGNOSIS — R195 Other fecal abnormalities: Secondary | ICD-10-CM

## 2022-10-09 DIAGNOSIS — E860 Dehydration: Secondary | ICD-10-CM | POA: Diagnosis present

## 2022-10-09 DIAGNOSIS — E538 Deficiency of other specified B group vitamins: Secondary | ICD-10-CM | POA: Diagnosis present

## 2022-10-09 DIAGNOSIS — J449 Chronic obstructive pulmonary disease, unspecified: Secondary | ICD-10-CM | POA: Diagnosis present

## 2022-10-09 DIAGNOSIS — N179 Acute kidney failure, unspecified: Secondary | ICD-10-CM | POA: Diagnosis present

## 2022-10-09 DIAGNOSIS — K921 Melena: Secondary | ICD-10-CM | POA: Diagnosis present

## 2022-10-09 DIAGNOSIS — K5731 Diverticulosis of large intestine without perforation or abscess with bleeding: Principal | ICD-10-CM | POA: Diagnosis present

## 2022-10-09 DIAGNOSIS — K449 Diaphragmatic hernia without obstruction or gangrene: Secondary | ICD-10-CM | POA: Diagnosis present

## 2022-10-09 DIAGNOSIS — K635 Polyp of colon: Secondary | ICD-10-CM | POA: Diagnosis present

## 2022-10-09 DIAGNOSIS — I2510 Atherosclerotic heart disease of native coronary artery without angina pectoris: Secondary | ICD-10-CM | POA: Diagnosis present

## 2022-10-09 DIAGNOSIS — N390 Urinary tract infection, site not specified: Secondary | ICD-10-CM | POA: Diagnosis present

## 2022-10-09 DIAGNOSIS — D649 Anemia, unspecified: Secondary | ICD-10-CM | POA: Diagnosis not present

## 2022-10-09 DIAGNOSIS — I272 Pulmonary hypertension, unspecified: Secondary | ICD-10-CM | POA: Diagnosis present

## 2022-10-09 DIAGNOSIS — E785 Hyperlipidemia, unspecified: Secondary | ICD-10-CM | POA: Diagnosis present

## 2022-10-09 DIAGNOSIS — K922 Gastrointestinal hemorrhage, unspecified: Secondary | ICD-10-CM | POA: Diagnosis not present

## 2022-10-09 DIAGNOSIS — F17210 Nicotine dependence, cigarettes, uncomplicated: Secondary | ICD-10-CM | POA: Diagnosis present

## 2022-10-09 DIAGNOSIS — Z79899 Other long term (current) drug therapy: Secondary | ICD-10-CM | POA: Diagnosis not present

## 2022-10-09 DIAGNOSIS — I110 Hypertensive heart disease with heart failure: Secondary | ICD-10-CM | POA: Diagnosis present

## 2022-10-09 DIAGNOSIS — E871 Hypo-osmolality and hyponatremia: Secondary | ICD-10-CM | POA: Diagnosis present

## 2022-10-09 DIAGNOSIS — F418 Other specified anxiety disorders: Secondary | ICD-10-CM | POA: Diagnosis present

## 2022-10-09 DIAGNOSIS — K644 Residual hemorrhoidal skin tags: Secondary | ICD-10-CM | POA: Diagnosis present

## 2022-10-09 DIAGNOSIS — F419 Anxiety disorder, unspecified: Secondary | ICD-10-CM

## 2022-10-09 DIAGNOSIS — F32A Depression, unspecified: Secondary | ICD-10-CM | POA: Diagnosis not present

## 2022-10-09 DIAGNOSIS — Z8249 Family history of ischemic heart disease and other diseases of the circulatory system: Secondary | ICD-10-CM

## 2022-10-09 DIAGNOSIS — D529 Folate deficiency anemia, unspecified: Secondary | ICD-10-CM | POA: Diagnosis present

## 2022-10-09 DIAGNOSIS — I5042 Chronic combined systolic (congestive) and diastolic (congestive) heart failure: Secondary | ICD-10-CM | POA: Diagnosis present

## 2022-10-09 DIAGNOSIS — K2100 Gastro-esophageal reflux disease with esophagitis, without bleeding: Secondary | ICD-10-CM | POA: Diagnosis present

## 2022-10-09 LAB — CBC WITH DIFFERENTIAL/PLATELET
Abs Immature Granulocytes: 0.03 10*3/uL (ref 0.00–0.07)
Basophils Absolute: 0.1 10*3/uL (ref 0.0–0.1)
Basophils Relative: 1 %
Eosinophils Absolute: 0.1 10*3/uL (ref 0.0–0.5)
Eosinophils Relative: 2 %
HCT: 25.4 % — ABNORMAL LOW (ref 36.0–46.0)
Hemoglobin: 8.1 g/dL — ABNORMAL LOW (ref 12.0–15.0)
Immature Granulocytes: 0 %
Lymphocytes Relative: 29 %
Lymphs Abs: 2.6 10*3/uL (ref 0.7–4.0)
MCH: 27.2 pg (ref 26.0–34.0)
MCHC: 31.9 g/dL (ref 30.0–36.0)
MCV: 85.2 fL (ref 80.0–100.0)
Monocytes Absolute: 0.6 10*3/uL (ref 0.1–1.0)
Monocytes Relative: 6 %
Neutro Abs: 5.5 10*3/uL (ref 1.7–7.7)
Neutrophils Relative %: 62 %
Platelets: 346 10*3/uL (ref 150–400)
RBC: 2.98 MIL/uL — ABNORMAL LOW (ref 3.87–5.11)
RDW: 13.6 % (ref 11.5–15.5)
WBC: 8.8 10*3/uL (ref 4.0–10.5)
nRBC: 0 % (ref 0.0–0.2)

## 2022-10-09 LAB — COMPREHENSIVE METABOLIC PANEL
ALT: 13 U/L (ref 0–44)
AST: 21 U/L (ref 15–41)
Albumin: 3.6 g/dL (ref 3.5–5.0)
Alkaline Phosphatase: 104 U/L (ref 38–126)
Anion gap: 11 (ref 5–15)
BUN: 24 mg/dL — ABNORMAL HIGH (ref 8–23)
CO2: 23 mmol/L (ref 22–32)
Calcium: 8.9 mg/dL (ref 8.9–10.3)
Chloride: 98 mmol/L (ref 98–111)
Creatinine, Ser: 1.73 mg/dL — ABNORMAL HIGH (ref 0.44–1.00)
GFR, Estimated: 32 mL/min — ABNORMAL LOW (ref >60)
Glucose, Bld: 72 mg/dL (ref 70–99)
Potassium: 3.8 mmol/L (ref 3.5–5.1)
Sodium: 132 mmol/L — ABNORMAL LOW (ref 135–145)
Total Bilirubin: 0.6 mg/dL (ref 0.3–1.2)
Total Protein: 7 g/dL (ref 6.5–8.1)

## 2022-10-09 LAB — URINALYSIS, ROUTINE W REFLEX MICROSCOPIC
Bilirubin Urine: NEGATIVE
Glucose, UA: NEGATIVE mg/dL
Hgb urine dipstick: NEGATIVE
Ketones, ur: NEGATIVE mg/dL
Leukocytes,Ua: NEGATIVE
Nitrite: POSITIVE — AB
Protein, ur: NEGATIVE mg/dL
Specific Gravity, Urine: 1.003 — ABNORMAL LOW (ref 1.005–1.030)
pH: 5 (ref 5.0–8.0)

## 2022-10-09 LAB — LIPASE, BLOOD: Lipase: 41 U/L (ref 11–51)

## 2022-10-09 LAB — PREPARE RBC (CROSSMATCH)

## 2022-10-09 LAB — HEMOGLOBIN AND HEMATOCRIT, BLOOD
HCT: 25.5 % — ABNORMAL LOW (ref 36.0–46.0)
Hemoglobin: 8.1 g/dL — ABNORMAL LOW (ref 12.0–15.0)

## 2022-10-09 LAB — TROPONIN I (HIGH SENSITIVITY)
Troponin I (High Sensitivity): 7 ng/L (ref <18)
Troponin I (High Sensitivity): 9 ng/L (ref <18)

## 2022-10-09 MED ORDER — ONDANSETRON HCL 4 MG/2ML IJ SOLN: 4.0000 mg | Freq: Four times a day (QID) | INTRAMUSCULAR | Status: DC | PRN

## 2022-10-09 MED ORDER — OXYCODONE-ACETAMINOPHEN 5-325 MG PO TABS
1.0000 | ORAL_TABLET | Freq: Once | ORAL | Status: AC
  Administered 2022-10-09: 1 via ORAL
  Filled 2022-10-09: qty 1

## 2022-10-09 MED ORDER — SODIUM CHLORIDE 0.9 % IV SOLN: INTRAVENOUS | Status: DC

## 2022-10-09 MED ORDER — DULOXETINE HCL 30 MG PO CPEP
60.0000 mg | ORAL_CAPSULE | Freq: Every day | ORAL | Status: DC
  Administered 2022-10-10 – 2022-10-12 (×3): 60 mg via ORAL
  Filled 2022-10-09: qty 1
  Filled 2022-10-09 (×2): qty 2

## 2022-10-09 MED ORDER — CARVEDILOL 6.25 MG PO TABS
6.2500 mg | ORAL_TABLET | Freq: Two times a day (BID) | ORAL | Status: DC
  Administered 2022-10-09 – 2022-10-12 (×6): 6.25 mg via ORAL
  Filled 2022-10-09 (×6): qty 1

## 2022-10-09 MED ORDER — ALPRAZOLAM 0.5 MG PO TABS
2.0000 mg | ORAL_TABLET | Freq: Two times a day (BID) | ORAL | Status: DC
  Administered 2022-10-09 – 2022-10-12 (×6): 2 mg via ORAL
  Filled 2022-10-09 (×6): qty 4

## 2022-10-09 MED ORDER — PANTOPRAZOLE SODIUM 40 MG IV SOLR
40.0000 mg | Freq: Once | INTRAVENOUS | Status: AC
  Administered 2022-10-09: 40 mg via INTRAVENOUS
  Filled 2022-10-09: qty 10

## 2022-10-09 MED ORDER — ALBUTEROL SULFATE (2.5 MG/3ML) 0.083% IN NEBU: 2.5000 mg | INHALATION_SOLUTION | Freq: Four times a day (QID) | RESPIRATORY_TRACT | Status: DC | PRN

## 2022-10-09 MED ORDER — MELATONIN 5 MG PO TABS: 5.0000 mg | ORAL_TABLET | Freq: Every evening | ORAL | Status: DC | PRN

## 2022-10-09 MED ORDER — IPRATROPIUM-ALBUTEROL 0.5-2.5 (3) MG/3ML IN SOLN: 3.0000 mL | Freq: Four times a day (QID) | RESPIRATORY_TRACT | Status: DC | PRN

## 2022-10-09 MED ORDER — CITALOPRAM HYDROBROMIDE 20 MG PO TABS
40.0000 mg | ORAL_TABLET | Freq: Every day | ORAL | Status: DC
  Administered 2022-10-10 – 2022-10-12 (×3): 40 mg via ORAL
  Filled 2022-10-09 (×3): qty 2

## 2022-10-09 MED ORDER — BUSPIRONE HCL 10 MG PO TABS
10.0000 mg | ORAL_TABLET | Freq: Two times a day (BID) | ORAL | Status: DC
  Administered 2022-10-09 – 2022-10-12 (×6): 10 mg via ORAL
  Filled 2022-10-09 (×2): qty 1
  Filled 2022-10-09: qty 2
  Filled 2022-10-09: qty 1
  Filled 2022-10-09: qty 2
  Filled 2022-10-09: qty 1

## 2022-10-09 MED ORDER — ACETAMINOPHEN 650 MG RE SUPP: 650.0000 mg | Freq: Four times a day (QID) | RECTAL | Status: DC | PRN

## 2022-10-09 MED ORDER — NITROGLYCERIN 0.4 MG SL SUBL: 0.4000 mg | SUBLINGUAL_TABLET | SUBLINGUAL | Status: DC | PRN

## 2022-10-09 MED ORDER — SODIUM CHLORIDE 0.9 % IV BOLUS: 500.0000 mL | Freq: Once | INTRAVENOUS | Status: DC

## 2022-10-09 MED ORDER — ONDANSETRON HCL 4 MG PO TABS: 4.0000 mg | ORAL_TABLET | Freq: Four times a day (QID) | ORAL | Status: DC | PRN

## 2022-10-09 MED ORDER — PANTOPRAZOLE SODIUM 40 MG PO TBEC: 40.0000 mg | DELAYED_RELEASE_TABLET | Freq: Every day | ORAL | 1 refills | Status: DC

## 2022-10-09 MED ORDER — ACETAMINOPHEN 325 MG PO TABS
650.0000 mg | ORAL_TABLET | Freq: Four times a day (QID) | ORAL | Status: DC | PRN
  Administered 2022-10-11: 650 mg via ORAL
  Filled 2022-10-09: qty 2

## 2022-10-09 MED ORDER — ATORVASTATIN CALCIUM 80 MG PO TABS
80.0000 mg | ORAL_TABLET | Freq: Every day | ORAL | Status: DC
  Administered 2022-10-10 – 2022-10-12 (×3): 80 mg via ORAL
  Filled 2022-10-09 (×2): qty 1
  Filled 2022-10-09: qty 4

## 2022-10-09 MED ORDER — SODIUM CHLORIDE 0.9 % IV SOLN
10.0000 mL/h | Freq: Once | INTRAVENOUS | Status: AC
  Administered 2022-10-09: 10 mL/h via INTRAVENOUS

## 2022-10-09 MED ORDER — GABAPENTIN 300 MG PO CAPS
400.0000 mg | ORAL_CAPSULE | Freq: Three times a day (TID) | ORAL | Status: DC
  Administered 2022-10-09: 400 mg via ORAL
  Filled 2022-10-09 (×2): qty 1

## 2022-10-09 MED ORDER — IOHEXOL 300 MG/ML  SOLN
75.0000 mL | Freq: Once | INTRAMUSCULAR | Status: AC | PRN
  Administered 2022-10-09: 75 mL via INTRAVENOUS

## 2022-10-09 MED ORDER — ISOSORBIDE MONONITRATE ER 30 MG PO TB24
30.0000 mg | ORAL_TABLET | Freq: Every day | ORAL | Status: DC
  Administered 2022-10-10 – 2022-10-12 (×3): 30 mg via ORAL
  Filled 2022-10-09 (×3): qty 1

## 2022-10-09 MED ORDER — EZETIMIBE 10 MG PO TABS
10.0000 mg | ORAL_TABLET | Freq: Every day | ORAL | Status: DC
  Administered 2022-10-10 – 2022-10-12 (×3): 10 mg via ORAL
  Filled 2022-10-09 (×3): qty 1

## 2022-10-09 NOTE — ED Triage Notes (Signed)
First Nurse Note:  Arrives with multiple complaints.  C/O lower left back pain, bilateral leg heaviness, and intermittent palpitations at night time when laying down.  AAOx3.  Skin warm and dry. NAD.

## 2022-10-09 NOTE — ED Notes (Signed)
Pt transported to CT ?

## 2022-10-09 NOTE — ED Triage Notes (Signed)
Pt comes with c/o dizziness, lower back pain and kidney pain. Pt state she feels dizzy when she stands. Pt states she just doesn't feel right.

## 2022-10-09 NOTE — ED Provider Notes (Signed)
[facility] Provider Note    Event Date/Time   First MD Initiated Contact with Patient 10/09/22 1700     (approximate)   History   Back Pain   HPI  Traci Duran is a 66 y.o. female   Past medical history of [place] hypertension, PAD, CAD, anxiety, CHF, COPD, GERD, hyperlipidemia and hypertension who presents to the emergency department with 4 days of bilateral flank pain left greater than right, diarrhea watery nonbloody, nausea but no vomiting, generalized weakness.  Denies bleeding.  She is a smoker but is trying to quit.  Chest pain, shortness of breath, cough or fever.  She denies dysuria.  She denies trauma.  He has been compliant with all medications.  She has had profuse diarrhea multiple episodes per day without bleeding, and has had poor p.o. intake due to nausea.  She got lightheaded when standing earlier today.  No syncope or trauma or head strike.  History was obtained via the patient. Review of external medical notes including cardiology note dated 08/14/2022 with recommendations for ongoing medical management of hypertension, mitral regurgitation and CAD.  At that time it was noted that she has lower GI bleeding and was advised to follow-up with PCP regarding colonoscopy and possible GI referral.  She denies GI bleeding today.      Physical Exam   Triage Vital Signs: ED Triage Vitals  Enc Vitals Group     BP 10/09/22 1623 119/84     Pulse Rate 10/09/22 1623 83     Resp 10/09/22 1623 18     Temp 10/09/22 1623 98 F (36.7 C)     Temp src --      SpO2 10/09/22 1623 100 %     Weight --      Height --      Head Circumference --      Peak Flow --      Pain Score 10/09/22 1622 3     Pain Loc --      Pain Edu? --      Excl. in [place]? --     Most recent vital signs: Vitals:   10/09/22 1623  BP: 119/84  Pulse: 83  Resp: 18  Temp: 98 F (36.7 C)  SpO2: 100%    General: Awake, no distress.  CV:  Appears dehydrated with poor skin  turgor and dry mucous membranes. Resp:  Normal effort.  No rales, wheezing, focalities. Abd:  No distention.  Nontender to palpation in all quadrants. Other:  Awake alert pleasant with normal hemodynamics, afebrile, normal rate and normotensive.  Neurological exam significant for no focal neurological deficits, extraocular movements intact, pupils equal round and reactive, no motor or sensory deficits.  Rectal exam with yellow stool guaiac positive   ED Results / Procedures / Treatments   Labs (all labs ordered are listed, but only abnormal results are displayed) Labs Reviewed  COMPREHENSIVE METABOLIC PANEL - Abnormal; Notable for the following components:      Result Value   Sodium 132 (*)    BUN 24 (*)    Creatinine, Ser 1.73 (*)    GFR, Estimated 32 (*)    All other components within normal limits  CBC WITH DIFFERENTIAL/PLATELET - Abnormal; Notable for the following components:   RBC 2.98 (*)    Hemoglobin 8.1 (*)    HCT 25.4 (*)    All other components within normal limits  URINE CULTURE  LIPASE, BLOOD  URINALYSIS, ROUTINE W REFLEX MICROSCOPIC  PREPARE RBC (CROSSMATCH)  TYPE AND SCREEN  TROPONIN I (HIGH SENSITIVITY)  TROPONIN I (HIGH SENSITIVITY)     I reviewed labs and they are notable for creatinine 1.73 from 1.06, hemoglobin of 8.1  EKG  ED ECG REPORT I, Lucillie Garfinkel, the attending physician, personally viewed and interpreted this ECG.   Date: 10/09/2022  EKG Time: 1625  Rate: 81  Rhythm: normal EKG, normal sinus rhythm  Axis: nl  Intervals:none  ST&T Change: No ischemic changes    RADIOLOGY I independently reviewed and interpreted chest x-ray and see no obvious focalities or pneumothorax.   PROCEDURES:  Critical Care performed: No  Procedures   MEDICATIONS ORDERED IN ED: Medications  sodium chloride 0.9 % bolus 500 mL ( Intravenous Canceled Entry 10/09/22 1838)  0.9 %  sodium chloride infusion (has no administration in time range)  iohexol  (OMNIPAQUE) 300 MG/ML solution 75 mL (75 mLs Intravenous Contrast Given 10/09/22 1826)  pantoprazole (PROTONIX) injection 40 mg (40 mg Intravenous Given 10/09/22 1926)  oxyCODONE-acetaminophen (PERCOCET/ROXICET) 5-325 MG per tablet 1 tablet (1 tablet Oral Given 10/09/22 1926)    Consultants:  I spoke with GI Dr Vicente Males regarding care plan for this patient.   IMPRESSION / MDM / ASSESSMENT AND PLAN / ED COURSE  I reviewed the triage vital signs and the nursing notes.                              Differential diagnosis includes, but is not limited to, infection, pyelonephritis, colitis, dehydration or metabolic derangements, ACS, anemia in the setting of history of GI bleeding, abdominal infection     MDM: Overall well-appearing patient who presents with generalized weakness in the setting of diarrhea over the past several days.  She appears clinically dehydrated with some dry mucous membranes and poor skin turgor, will give gentle fluid hydration via IV.  Also will work-up her diarrhea and flank pain with basic labs, urinalysis, CT scan of the abdomen pelvis with IV contrast for infection or intra-abdominal infections.  I doubt ACS but with cardiac risk factors and lightheadedness upon standing, global weakness will check EKG and troponins.  Patient with guaiac positive stools, anemia of 8.1 with CAD history and symptomatic from blood loss with generalized weakness lightheadedness we will give a unit of packed red blood cell transfusion at this time.  She was lost to follow-up of supposed to have colonoscopy outpatient by GI, given evidence of AKI and symptomatic anemia requiring packed red blood cell and suspected GI bleeding and this otherwise stable patient will admit to hospitalist service, consulted with GI Dr. Vicente Males at this time.  CT scan with nonsurgical abdomen.  Patient's presentation is most consistent with acute presentation with potential threat to life or bodily function.       FINAL  CLINICAL IMPRESSION(S) / ED DIAGNOSES   Final diagnoses:  Guaiac positive stools  Anemia, blood loss  Symptomatic anemia     Rx / DC Orders   ED Discharge Orders          Ordered    pantoprazole (PROTONIX) 40 MG tablet  Daily        10/09/22 1829             Note:  This document was prepared using Dragon voice recognition software and may include unintentional dictation errors.    Lucillie Garfinkel, MD 10/09/22 1929

## 2022-10-09 NOTE — Discharge Instructions (Signed)
Call GI for colonoscopy and endoscopy.  Take protonix as prescribed.   Thank you for choosing [place] for your health care today!  Please see your primary doctor this week for a follow up appointment.   If you do not have a primary doctor call the following clinics to establish care:  If you have insurance:  [facility] [phone] [place] [address]   [facility]  [phone] [place] [address]   If you do not have insurance:  [facility]  [phone] [address] [place] [address]  Sometimes, in the early stages of certain disease courses it is difficult to detect in the emergency department evaluation -- so, it is important that you continue to monitor your symptoms and call your doctor right away or return to the emergency department if you develop any new or worsening symptoms.  It was my pleasure to care for you today.   Hoover Brunette Jacelyn Grip, MD

## 2022-10-10 DIAGNOSIS — N179 Acute kidney failure, unspecified: Secondary | ICD-10-CM

## 2022-10-10 DIAGNOSIS — D649 Anemia, unspecified: Secondary | ICD-10-CM | POA: Diagnosis not present

## 2022-10-10 DIAGNOSIS — D500 Iron deficiency anemia secondary to blood loss (chronic): Secondary | ICD-10-CM | POA: Diagnosis not present

## 2022-10-10 DIAGNOSIS — N390 Urinary tract infection, site not specified: Secondary | ICD-10-CM

## 2022-10-10 DIAGNOSIS — F32A Depression, unspecified: Secondary | ICD-10-CM

## 2022-10-10 LAB — BASIC METABOLIC PANEL
Anion gap: 7 (ref 5–15)
Anion gap: 4 — ABNORMAL LOW (ref 5–15)
BUN: 24 mg/dL — ABNORMAL HIGH (ref 8–23)
BUN: 20 mg/dL (ref 8–23)
CO2: 25 mmol/L (ref 22–32)
CO2: 25 mmol/L (ref 22–32)
Calcium: 8.8 mg/dL — ABNORMAL LOW (ref 8.9–10.3)
Calcium: 8.6 mg/dL — ABNORMAL LOW (ref 8.9–10.3)
Chloride: 104 mmol/L (ref 98–111)
Chloride: 106 mmol/L (ref 98–111)
Creatinine, Ser: 1.74 mg/dL — ABNORMAL HIGH (ref 0.44–1.00)
Creatinine, Ser: 1.68 mg/dL — ABNORMAL HIGH (ref 0.44–1.00)
GFR, Estimated: 32 mL/min — ABNORMAL LOW (ref >60)
GFR, Estimated: 34 mL/min — ABNORMAL LOW (ref >60)
Glucose, Bld: 88 mg/dL (ref 70–99)
Glucose, Bld: 117 mg/dL — ABNORMAL HIGH (ref 70–99)
Potassium: 3.6 mmol/L (ref 3.5–5.1)
Potassium: 4 mmol/L (ref 3.5–5.1)
Sodium: 136 mmol/L (ref 135–145)
Sodium: 135 mmol/L (ref 135–145)

## 2022-10-10 LAB — VITAMIN B12: Vitamin B-12: 353 pg/mL (ref 180–914)

## 2022-10-10 LAB — FOLATE: Folate: 6 ng/mL (ref >5.9)

## 2022-10-10 LAB — CBC WITH DIFFERENTIAL/PLATELET
Abs Immature Granulocytes: 0.01 10*3/uL (ref 0.00–0.07)
Basophils Absolute: 0 10*3/uL (ref 0.0–0.1)
Basophils Relative: 1 %
Eosinophils Absolute: 0.1 10*3/uL (ref 0.0–0.5)
Eosinophils Relative: 2 %
HCT: 26.8 % — ABNORMAL LOW (ref 36.0–46.0)
Hemoglobin: 8.5 g/dL — ABNORMAL LOW (ref 12.0–15.0)
Immature Granulocytes: 0 %
Lymphocytes Relative: 30 %
Lymphs Abs: 1.8 10*3/uL (ref 0.7–4.0)
MCH: 27.2 pg (ref 26.0–34.0)
MCHC: 31.7 g/dL (ref 30.0–36.0)
MCV: 85.9 fL (ref 80.0–100.0)
Monocytes Absolute: 0.5 10*3/uL (ref 0.1–1.0)
Monocytes Relative: 9 %
Neutro Abs: 3.5 10*3/uL (ref 1.7–7.7)
Neutrophils Relative %: 58 %
Platelets: 248 10*3/uL (ref 150–400)
RBC: 3.12 MIL/uL — ABNORMAL LOW (ref 3.87–5.11)
RDW: 13.6 % (ref 11.5–15.5)
WBC: 6 10*3/uL (ref 4.0–10.5)
nRBC: 0 % (ref 0.0–0.2)

## 2022-10-10 LAB — TYPE AND SCREEN
ABO/RH(D): O POS
Antibody Screen: NEGATIVE
Unit division: 0

## 2022-10-10 LAB — CBC
HCT: 29.3 % — ABNORMAL LOW (ref 36.0–46.0)
Hemoglobin: 9.5 g/dL — ABNORMAL LOW (ref 12.0–15.0)
MCH: 27.5 pg (ref 26.0–34.0)
MCHC: 32.4 g/dL (ref 30.0–36.0)
MCV: 84.9 fL (ref 80.0–100.0)
Platelets: 268 10*3/uL (ref 150–400)
RBC: 3.45 MIL/uL — ABNORMAL LOW (ref 3.87–5.11)
RDW: 13.6 % (ref 11.5–15.5)
WBC: 7 10*3/uL (ref 4.0–10.5)
nRBC: 0 % (ref 0.0–0.2)

## 2022-10-10 LAB — BPAM RBC
Blood Product Expiration Date: 202312072359
ISSUE DATE / TIME: 202311022301
Unit Type and Rh: 5100

## 2022-10-10 LAB — IRON AND TIBC
Iron: 21 ug/dL — ABNORMAL LOW (ref 28–170)
Saturation Ratios: 6 % — ABNORMAL LOW (ref 10.4–31.8)
TIBC: 328 ug/dL (ref 250–450)
UIBC: 307 ug/dL

## 2022-10-10 LAB — LACTIC ACID, PLASMA: Lactic Acid, Venous: 1.2 mmol/L (ref 0.5–1.9)

## 2022-10-10 LAB — HIV ANTIBODY (ROUTINE TESTING W REFLEX): HIV Screen 4th Generation wRfx: NONREACTIVE

## 2022-10-10 LAB — FERRITIN: Ferritin: 9 ng/mL — ABNORMAL LOW (ref 11–307)

## 2022-10-10 MED ORDER — SODIUM CHLORIDE 0.9 % IV SOLN: INTRAVENOUS | Status: DC

## 2022-10-10 MED ORDER — SODIUM CHLORIDE 0.9 % IV SOLN
300.0000 mg | Freq: Once | INTRAVENOUS | Status: AC
  Administered 2022-10-10: 300 mg via INTRAVENOUS
  Filled 2022-10-10: qty 300

## 2022-10-10 MED ORDER — PANTOPRAZOLE SODIUM 40 MG IV SOLR
40.0000 mg | Freq: Two times a day (BID) | INTRAVENOUS | Status: DC
  Administered 2022-10-10 – 2022-10-12 (×5): 40 mg via INTRAVENOUS
  Filled 2022-10-10 (×5): qty 10

## 2022-10-10 MED ORDER — GABAPENTIN 300 MG PO CAPS
600.0000 mg | ORAL_CAPSULE | Freq: Three times a day (TID) | ORAL | Status: DC
  Administered 2022-10-10 – 2022-10-12 (×7): 600 mg via ORAL
  Filled 2022-10-10 (×7): qty 2

## 2022-10-10 MED ORDER — PANTOPRAZOLE SODIUM 40 MG IV SOLR: 40.0000 mg | Freq: Two times a day (BID) | INTRAVENOUS | Status: DC

## 2022-10-10 MED ORDER — LIDOCAINE 5 % EX PTCH
1.0000 | MEDICATED_PATCH | CUTANEOUS | Status: DC
  Administered 2022-10-10 – 2022-10-11 (×2): 1 via TRANSDERMAL
  Filled 2022-10-10 (×3): qty 1

## 2022-10-10 MED ORDER — FOLIC ACID 1 MG PO TABS
1.0000 mg | ORAL_TABLET | Freq: Every day | ORAL | Status: DC
  Administered 2022-10-10 – 2022-10-12 (×3): 1 mg via ORAL
  Filled 2022-10-10 (×3): qty 1

## 2022-10-10 MED ORDER — PANTOPRAZOLE 80MG IVPB - SIMPLE MED
80.0000 mg | Freq: Once | INTRAVENOUS | Status: AC
  Administered 2022-10-10: 80 mg via INTRAVENOUS
  Filled 2022-10-10: qty 100

## 2022-10-10 MED ORDER — POLYETHYLENE GLYCOL 3350 17 G PO PACK
17.0000 g | PACK | Freq: Every day | ORAL | Status: DC
  Administered 2022-10-10: 17 g via ORAL
  Filled 2022-10-10: qty 1

## 2022-10-10 MED ORDER — OXYCODONE HCL 5 MG PO TABS
5.0000 mg | ORAL_TABLET | ORAL | Status: DC | PRN
  Administered 2022-10-10 – 2022-10-12 (×9): 10 mg via ORAL
  Filled 2022-10-10 (×9): qty 2

## 2022-10-10 MED ORDER — PANTOPRAZOLE INFUSION (NEW) - SIMPLE MED
8.0000 mg/h | INTRAVENOUS | Status: DC
  Administered 2022-10-10: 8 mg/h via INTRAVENOUS
  Filled 2022-10-10: qty 100

## 2022-10-10 MED ORDER — PANTOPRAZOLE SODIUM 40 MG IV SOLR
40.0000 mg | Freq: Once | INTRAVENOUS | Status: AC
  Administered 2022-10-10: 40 mg via INTRAVENOUS
  Filled 2022-10-10: qty 10

## 2022-10-10 MED ORDER — SODIUM CHLORIDE 0.9 % IV SOLN
1.0000 g | Freq: Every day | INTRAVENOUS | Status: AC
  Administered 2022-10-10 – 2022-10-12 (×3): 1 g via INTRAVENOUS
  Filled 2022-10-10 (×3): qty 10

## 2022-10-10 NOTE — Assessment & Plan Note (Addendum)
-   The patient will be admitted to a PCU bed. - She was typed and crossmatched will be transfused 1 unit of packed red blood cells. - We will follow posttransfusion H&H as well as serial levels. - She has subsequent acute blood loss anemia on top of chronic anemia. - We will place her on IV PPI therapy with Protonix. - GI consult will be obtained. - Dr. Vicente Males was notified about the patient.

## 2022-10-10 NOTE — Assessment & Plan Note (Signed)
-   She will be placed on IV Rocephin. - We will follow urine culture and sensitivity.

## 2022-10-10 NOTE — Progress Notes (Signed)
Brief hospitalist update note.  This is a nonbillable note.  Please see same-day H&P for full billable details.  Briefly, this is a 66 year old female who presents to the emergency department with chief complaint of worsening back pain associated with With fatigue and dizziness and generalized weakness while ambulating.  Patient does endorse diarrhea over the last few weeks.  No bright red blood however does endorse black stools.  No history of oral iron supplementation or Pepto-Bismol use.  No excessive NSAID use.  Does endorse nausea and GERD symptoms.  Patient received 1 unit PRBC on admission for symptomatic anemia.  Follow-up hemoglobin this morning 9.5.  GI consulted on admission.  Their evaluation is pending.  Patient is markedly iron deficient.  Will receive iron sucrose 300 mg IV x1 followed by daily iron replacement.  Defer to GI regarding plans for endoscopic evaluation.  Patient also noted to have relative folate deficiency.  We will supplement with folic acid 1 mg p.o. daily.  Ralene Muskrat MD  No charge

## 2022-10-10 NOTE — Assessment & Plan Note (Deleted)
-   We will continue statin therapy. 

## 2022-10-10 NOTE — Assessment & Plan Note (Signed)
-   This likely secondary to GI bleeding. - She will be hydrated with IV normal saline and will follow BMP. - We will avoid nephrotoxins.

## 2022-10-10 NOTE — Consult Note (Signed)
[name], MD [address]  [place]  [place], [place] [address]  Main: [phone]  Fax: [phone] Pager: [phone]   Consultation  Referring Provider:     No ref. provider found Primary Care Physician:  [facility] Primary Gastroenterologist:  Dr. Lucilla Lame Reason for Consultation: Melena, worsening anemia  Date of Admission:  10/09/2022 Date of Consultation:  10/10/2022         HPI:   Traci Duran is a 66 y.o. female with history of COPD, coronary disease, GERD, hiatal hernia, history of Cameron ulcers, peripheral vascular disease presents to the emergency room with back pain, generalized weakness as well as dark stools, pt states she took [facility] powder for HA, resulted in acif reflux and dark stools.  In the ER, patient was hemodynamically stable, serum sodium 132, BUN/creatinine 20/1.40 which is worse compared to 02/2022.  Hemoglobin 8.1 compared to 10.7 since July 2022.  However, it was 7.7 in March 2023.  Patient underwent CT abdomen pelvis with contrast secondary to back pain, found to have healing fractures of the left seventh through 11th ribs.  Extensive atherosclerosis of the coronary arteries, celiac trunk, SMA, IMA, common iliac arteries.  Sigmoid diverticulosis.  Patient received 1 unit of PRBCs for symptomatic anemia, hemoglobin responded appropriately to 9.5 today.  Found to have severe iron deficiency, serum ferritin was 9.  Patient is not on any oral iron supplementation.  Received IV iron as well. Patient was last evaluated by Dr. Allen Norris in April 2023.  Given history of colonic AVMs and persistent iron deficiency anemia, recommendation was made to undergo EGD and colonoscopy.  However, patient rescheduled her procedures several times due to lack of transportation.  NSAIDs: BC powder for headaches  Antiplts/Anticoagulants/Anti thrombotics: None  GI Procedures:  EGD and colonoscopy 10/08/2019 - Medium-sized hiatal hernia with a single  Cameron ulcer. Treated with argon plasma coagulation (APC). 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 Treated with argon plasma coagulation (APC). - Gastritis. - Normal examined duodenum. - No specimens collected.   - One 6 mm polyp in the sigmoid colon, removed with a cold snare. Resected and retrieved. - Two non-bleeding colonic angiodysplastic lesions. Treated with argon plasma coagulation (APC). - Diverticulosis in the sigmoid colon. - Non-bleeding internal hemorrhoids.   Video capsule endoscopy 10/12/2019 Proximal small bowel AVMs, nonbleeding  Small bowel enteroscopy 07/18/2020, unremarkable   Past Medical History:  Diagnosis Date   Anemia 05/2020   Thought to be related to GI bleed.   Anxiety    Bilateral carotid artery stenosis without cerebral infarction 10/2010    November 2011, CTA of the head and neck: Near complete occlusion versus severe stenosis of nearly the entire basilar artery, complete occlusion of the right vertebral artery from its origin off the right SCA, moderate noncalcified atherosclerotic in the proximal left SCA which results in a 40% stenosis. Right ICA with approximately 15% stenosis, left   Nov. 2012 Carotid Duplex: >75% diameter re   CHF (congestive heart failure), chronic HFrEF NYHA class II, chronic, diastolic (HCC)    Chronic HFrEF   COPD (chronic obstructive pulmonary disease) (HCC)    Coronary artery disease, non-occlusive 10/2009   Cardiac Cath: EF 85%;  40% prox &mid RCA -> 80% RPDA and AMrg, 60% RPAV.  40% prox & distal LAD w. 60% dLAD and D2, - Med Rx; Myoview 9/19: Small size, Mod severity Apical Anterior defect  c/w ISCHEMIA. -> INTERMEDIATE RISK (med Rx because of  anemia, and known distal LAD and PDA disease. ->  Similar defect noted in August 2009)   Depression    Gastritis    GERD (gastroesophageal reflux disease)    Hemorrhoids    Hyperlipidemia    Hypertension    Mild aortic stenosis by prior echocardiogram  06/2020   Mild aortic stenosis by echo   Moderate to severe mitral regurgitation 06/2020   Echo at [facility]: Moderate-severe MR with mild RV enlargement and severely elevated PA 95 mmHg.   Myocardial infarction [facility])    Demand ischemia infarction initially in 2010   PAD (peripheral artery disease) ([place]) 07/2008   a) 8/'09: normal ABIs; b) post procedure Dopplers December 2010: Suggestion of left PFA/CFA-SFV AV fistula.; c) 02/2010: Left CFA-PFA AV fistula still present.; d) 04/2010: CTA Abd/Pelvis- LE Runoff: Extensive Mixed plaque in the Abd Ao.  Patent visceral As.  Bilat 2V runnoff-feet ATA & Peroneal A;  (confirmed by Arteriogram 03/2011); e) ABIs October 2017 R ABI 0.88, L ABI 0.79.   Pulmonary hypertension ([place]) 06/2020   [facility] echo shows moderate-severe MR, moderate to severe TR with RVP estimated 95 mmHg.   Stenosis of left subclavian artery ([place]) 10/27/2010   ~75% by recent doppler -- CHECK BP ON R ARM (or bilaterally for confirmation)    Past Surgical History:  Procedure Laterality Date   APPENDECTOMY     COLONOSCOPY WITH PROPOFOL N/A 06/09/2017   Procedure: COLONOSCOPY WITH PROPOFOL;  Surgeon: Lucilla Lame, MD;  Location: [facility] ENDOSCOPY;  Service: Endoscopy;  Laterality: N/A;   COLONOSCOPY WITH PROPOFOL N/A 10/08/2019   Procedure: COLONOSCOPY WITH PROPOFOL;  Surgeon: Lucilla Lame, MD;  Location: [facility] ENDOSCOPY;  Service: Endoscopy;  Laterality: N/A;   ENTEROSCOPY N/A 10/08/2019   Procedure: ENTEROSCOPY;  Surgeon: Lucilla Lame, MD;  Location: [facility] ENDOSCOPY;  Service: Endoscopy;  Laterality: N/A;   ENTEROSCOPY N/A 07/18/2020   Procedure: ENTEROSCOPY;  Surgeon: Lin Landsman, MD;  Location: [facility] ENDOSCOPY;  Service: Gastroenterology;  Laterality: N/A;   ESOPHAGOGASTRODUODENOSCOPY N/A 07/30/2017   Procedure: ESOPHAGOGASTRODUODENOSCOPY (EGD);  Surgeon: Lin Landsman, MD;  Location: [facility] ENDOSCOPY;  Service: Gastroenterology;  Laterality: N/A;   ESOPHAGOGASTRODUODENOSCOPY (EGD)  WITH PROPOFOL N/A 04/09/2017   Procedure: ESOPHAGOGASTRODUODENOSCOPY (EGD) WITH PROPOFOL;  Surgeon: Lucilla Lame, MD;  Location: [facility] ENDOSCOPY;  Service: Endoscopy;  Laterality: N/A;   GIVENS CAPSULE STUDY N/A 10/08/2019   Procedure: GIVENS CAPSULE STUDY;  Surgeon: Lucilla Lame, MD;  Location: [facility] ENDOSCOPY;  Service: Endoscopy;  Laterality: N/A;   HIP FRACTURE SURGERY     LEFT HEART CATH AND CORONARY ANGIOGRAPHY  10/2009   [facility]: EF 85%;  40% prox &mid RCA -> 80% RPDA and AMrg, 60% RPAV.  40% prox & distal LAD w. 60% dLAD and D2, - Med Rx   LOWER EXTREMITY ARTERIOGRAM Bilateral 03/28/2011   ([facility]) RLE: moderate focal prox. R> CIA stenosis, patent PFA, SFA, CFA, 2 vessel runoff via PT/peroneal, AT proximally occluded. LLE: moderate CFA stenosis, patent PFA, SFA and PA, 2 vessel runoff via AT/peroneal, PT occluded.   NM GATED MYOVIEW ([place] [facility])  08/2018   Small sized mild severity defect in the apical anterior wall concerning for ischemia.  INTERMEDIATE RISK. (Appears to be no change from 2009)   NM MYOVIEW LTD  07/2008   ([facility]): A) 07/2008: Minimal reversible defect in the apex with possible ischemia.  Likely represents artifact.  EF 59% -> Cath: 80% PDA, 60% PAV and distal LAD dZ); B) 08/2010: Lexiscan - > ischemia the Ant &  Inf Apex.  EF 64%.(Med Rx); C) Jan 2013: + Apical Ischemia - unchanged; D) Jan 2016: EF 76%. No Ischemia /Infarct.   TRANSTHORACIC ECHOCARDIOGRAM  11/2009   ([place]) A) December 2010, 2D echo: EF >55%, mild concentric LVH, diastolic dysfunction Gr. I, LVIDd 4.2 cm, LVIDs 2.3 cm, mild TR (peak RVP 28 mmHg).; B) November 2011, 2D echo: EF >55%, mild concentric LVH, diastolic dysfunction Gr. I, LVIDd 4.3 cm, LVIDs 1.7 cm, mild PR (peak RVP 25 mmHg).; C) 12/2014 Echo: EF >55%, mild LVH, DD gr. I, LAE 3.9cm, Moderate TR (PRVP 35 mmHg)   TRANSTHORACIC ECHOCARDIOGRAM  06/13/2020   [facility]: EF 60-65%.  Normal LV function.  Indeterminate diastolic  pressures.  Mild RV enlargement with severely elevated PAP estimated 95. moderate biatrial dilation.  Moderate to severe MR.  Moderate to severe TR.  Mild AS.     Current Facility-Administered Medications:    0.9 %  sodium chloride infusion, , Intravenous, Continuous, Mansy, Jan A, MD, Last Rate: 100 mL/hr at 10/09/22 2322, New Bag at 10/09/22 2322   0.9 %  sodium chloride infusion, , Intravenous, Continuous, Davien Malone, Tally Due, MD, Last Rate: 10 mL/hr at 10/10/22 1457, New Bag at 10/10/22 1457   acetaminophen (TYLENOL) tablet 650 mg, 650 mg, Oral, Q6H PRN **OR** acetaminophen (TYLENOL) suppository 650 mg, 650 mg, Rectal, Q6H PRN, Mansy, Jan A, MD   albuterol (PROVENTIL) (2.5 MG/3ML) 0.083% nebulizer solution 2.5 mg, 2.5 mg, Nebulization, Q6H PRN, Mansy, Jan A, MD   ALPRAZolam Duanne Moron) tablet 2 mg, 2 mg, Oral, BID, Mansy, Jan A, MD, 2 mg at 10/10/22 0925   atorvastatin (LIPITOR) tablet 80 mg, 80 mg, Oral, Daily, Mansy, Jan A, MD, 80 mg at 10/10/22 8250   busPIRone (BUSPAR) tablet 10 mg, 10 mg, Oral, BID, Mansy, Jan A, MD, 10 mg at 10/10/22 5397   carvedilol (COREG) tablet 6.25 mg, 6.25 mg, Oral, BID, Mansy, Jan A, MD, 6.25 mg at 10/10/22 6734   cefTRIAXone (ROCEPHIN) 1 g in sodium chloride 0.9 % 100 mL IVPB, 1 g, Intravenous, Q0600, Mansy, Jan A, MD, Stopped at 10/10/22 0529   citalopram (CELEXA) tablet 40 mg, 40 mg, Oral, Daily, Mansy, Jan A, MD, 40 mg at 10/10/22 1937   DULoxetine (CYMBALTA) DR capsule 60 mg, 60 mg, Oral, Daily, Mansy, Jan A, MD, 60 mg at 10/10/22 9024   ezetimibe (ZETIA) tablet 10 mg, 10 mg, Oral, Daily, Mansy, Jan A, MD, 10 mg at 09/73/53 2992   folic acid (FOLVITE) tablet 1 mg, 1 mg, Oral, Daily, Sreenath, Sudheer B, MD, 1 mg at 10/10/22 1304   gabapentin (NEURONTIN) capsule 600 mg, 600 mg, Oral, TID, Priscella Mann, Sudheer B, MD, 600 mg at 10/10/22 1553   ipratropium-albuterol (DUONEB) 0.5-2.5 (3) MG/3ML nebulizer solution 3 mL, 3 mL, Nebulization, Q6H PRN, Mansy, Jan A, MD    isosorbide mononitrate (IMDUR) 24 hr tablet 30 mg, 30 mg, Oral, Daily, Mansy, Jan A, MD, 30 mg at 10/10/22 0925   lidocaine (LIDODERM) 5 % 1 patch, 1 patch, Transdermal, Q24H, Sreenath, Sudheer B, MD, 1 patch at 10/10/22 0824   melatonin tablet 5 mg, 5 mg, Oral, QHS PRN, Mansy, Jan A, MD   nitroGLYCERIN (NITROSTAT) SL tablet 0.4 mg, 0.4 mg, Sublingual, PRN, Mansy, Jan A, MD   ondansetron (ZOFRAN) tablet 4 mg, 4 mg, Oral, Q6H PRN **OR** ondansetron (ZOFRAN) injection 4 mg, 4 mg, Intravenous, Q6H PRN, Mansy, Jan A, MD   oxyCODONE (Oxy IR/ROXICODONE) immediate release tablet 5-10 mg, 5-10 mg,  Oral, Q4H PRN, Ralene Muskrat B, MD, 10 mg at 10/10/22 1557   pantoprazole (PROTONIX) injection 40 mg, 40 mg, Intravenous, Q12H, Sreenath, Sudheer B, MD, 40 mg at 10/10/22 0932   polyethylene glycol (MIRALAX / GLYCOLAX) packet 17 g, 17 g, Oral, Daily, Damire Remedios, Tally Due, MD, 17 g at 10/10/22 1553   sodium chloride 0.9 % bolus 500 mL, 500 mL, Intravenous, Once, Lucillie Garfinkel, MD  Facility-Administered Medications Ordered in Other Encounters:    alteplase (CATHFLO ACTIVASE) injection 2 mg, 2 mg, Intracatheter, Once PRN, Sindy Guadeloupe, MD   cyanocobalamin ((VITAMIN B-12)) injection 1,000 mcg, 1,000 mcg, Intramuscular, Q30 days, Sindy Guadeloupe, MD, 1,000 mcg at 02/19/21 1024   heparin lock flush 100 unit/mL, 500 Units, Intracatheter, Once PRN, Sindy Guadeloupe, MD   heparin lock flush 100 unit/mL, 250 Units, Intracatheter, Once PRN, Sindy Guadeloupe, MD   sodium chloride flush (NS) 0.9 % injection 10 mL, 10 mL, Intracatheter, Once PRN, Sindy Guadeloupe, MD   sodium chloride flush (NS) 0.9 % injection 3 mL, 3 mL, Intracatheter, Once PRN, Sindy Guadeloupe, MD   Family History  Problem Relation Age of Onset   Heart failure Mother    Heart attack Mother        mother died at 56 of an MI   Heart attack Father        father died at 60 of an MI   Heart attack Brother        Brother had an MI in his 42s     Social  History   Tobacco Use   Smoking status: Every Day    Packs/day: 1.00    Years: 46.00    Total pack years: 46.00    Types: Cigarettes   Smokeless tobacco: Never   Tobacco comments:    0.5PPD 02/17/2022  Vaping Use   Vaping Use: Never used  Substance Use Topics   Alcohol use: No   Drug use: Yes    Types: Marijuana    Comment: monthly    Allergies as of 10/09/2022 - Review Complete 10/09/2022  Allergen Reaction Noted   Chantix [varenicline] Hives, Shortness Of Breath, and Palpitations 09/06/2012   Diphenhydramine hcl Shortness Of Breath    Benadryl [diphenhydramine hcl] Rash 10/14/2011   Niacin Rash    Trazodone Palpitations 05/23/2015    Review of Systems:    All systems reviewed and negative except where noted in HPI.   Physical Exam:  Vital signs in last 24 hours: Temp:  [97.5 F (36.4 C)-98 F (36.7 C)] 98 F (36.7 C) (11/03 1558) Pulse Rate:  [58-83] 72 (11/03 1558) Resp:  [16-18] 17 (11/03 1558) BP: (87-128)/(43-84) 126/76 (11/03 1558) SpO2:  [92 %-100 %] 96 % (11/03 1558) Last BM Date : 10/10/22 General:   Pleasant, cooperative in NAD Head:  Normocephalic and atraumatic. Eyes:   No icterus.   Conjunctiva pink. PERRLA. Ears:  Normal auditory acuity. Neck:  Supple; no masses or thyroidomegaly Lungs: Respirations even and unlabored. Lungs clear to auscultation bilaterally.   No wheezes, crackles, or rhonchi.  Heart:  Regular rate and rhythm;  Without murmur, clicks, rubs or gallops Abdomen:  Soft, nondistended, nontender. Normal bowel sounds. No appreciable masses or hepatomegaly.  No rebound or guarding.  Rectal:  Not performed. Msk:  Symmetrical without gross deformities.  Strength generalized weakness Extremities:  Without edema, cyanosis or clubbing. Neurologic:  Alert and oriented x3;  grossly normal neurologically. Skin:  Intact without significant lesions or rashes.  Psych:  Alert and cooperative. Normal affect.  LAB RESULTS:    Latest Ref Rng & Units  10/10/2022    3:14 PM 10/10/2022    4:49 AM 10/09/2022    8:33 PM  CBC  WBC 4.0 - 10.5 K/uL 6.0  7.0    Hemoglobin 12.0 - 15.0 g/dL 8.5  9.5  8.1   Hematocrit 36.0 - 46.0 % 26.8  29.3  25.5   Platelets 150 - 400 K/uL 248  268      BMET    Latest Ref Rng & Units 10/10/2022    3:14 PM 10/10/2022    4:49 AM 10/09/2022    5:11 PM  BMP  Glucose 70 - 99 mg/dL 117  88  72   BUN 8 - 23 mg/dL _0 Creatinine 0.44 - 1.00 mg/dL 1.68  1.74  1.73   Sodium 135 - 145 mmol/L 135  136  132   Potassium 3.5 - 5.1 mmol/L 4.0  3.6  3.8   Chloride 98 - 111 mmol/L 106  104  98   CO2 22 - 32 mmol/L _1 Calcium 8.9 - 10.3 mg/dL 8.6  8.8  8.9     LFT    Latest Ref Rng & Units 10/09/2022    5:11 PM 02/11/2022    2:38 PM 11/13/2019   12:19 AM  Hepatic Function  Total Protein 6.5 - 8.1 g/dL 7.0  6.6  7.6   Albumin 3.5 - 5.0 g/dL 3.6  3.5  4.2   AST 15 - 41 U/L _2 ALT 0 - 44 U/L _3 Alk Phosphatase 38 - 126 U/L 104  106  99   Total Bilirubin 0.3 - 1.2 mg/dL 0.6  0.3  0.5      STUDIES: CT Abdomen Pelvis W Contrast  Result Date: 10/09/2022 CLINICAL DATA:  Flank pain, left back pain. EXAM: CT ABDOMEN AND PELVIS WITH CONTRAST TECHNIQUE: Multidetector CT imaging of the abdomen and pelvis was performed using the standard protocol following bolus administration of intravenous contrast. RADIATION DOSE REDUCTION: This exam was performed according to the departmental dose-optimization program which includes automated exposure control, adjustment of the mA and/or kV according to patient size and/or use of iterative reconstruction technique. CONTRAST:  37m OMNIPAQUE IOHEXOL 300 MG/ML  SOLN COMPARISON:  CT pelvis 11/13/2019 FINDINGS: Lower chest: Circumflex and right coronary artery atherosclerosis with descending thoracic aortic atherosclerosis. Small type 1 hiatal hernia. Hepatobiliary: Contracted gallbladder. No significant hepatic parenchymal lesion identified. Pancreas:  Unremarkable Spleen: Numerous punctate calcifications in the spleen compatible with old granulomatous disease. Adrenals/Urinary Tract: Adrenal glands unremarkable. Atrophic right kidney with vascular calcifications along the right renal hilum. Vascular calcification along the margin of the right distal ureter but not within the right ureter. There vascular calcifications of the left renal hilum. No urinary tract calculi observed. Urinary bladder unremarkable. Stomach/Bowel: Fatty prominence of the wall of the ascending colon, this is frequently incidental but can have a weak association with inflammatory bowel disease. No findings of inflammation involving the terminal ileum. Sigmoid colon diverticulosis without findings of acute diverticulitis. Vascular/Lymphatic: Atherosclerosis is present, including aortoiliac atherosclerotic disease. Extensive atheromatous plaque in the celiac trunk along with notable atheromatous plaque narrowing the proximal SMA as well, and with atherosclerotic calcification in distal SMA branches. Neither vessel appears overtly occluded but the degree of stenosis is considerable. There is atheromatous vascular disease  in the proximal IMA which does not appear overtly occluded either. Prominent atherosclerotic vascular calcification and atherosclerotic narrowing in the common iliac and external iliac arteries and extending into the superficial femoral arteries. Please note that today's exam is not a CT angiogram. No pathologic adenopathy is observed. Reproductive: Unremarkable Other: No supplemental non-categorized findings. Musculoskeletal: Left femoral nail with femoral head lag screw. Broad Schmorl's nodes along the superior endplates of E99 and B71. No lumbar spine fracture or subluxation. There is likely a disc bulge at L4-5 but no substantial impingement. No impinging lesion observed along the sacral plexus or proximal sciatic nerves. Healing fractures of the left lateral seventh,  eighth, ninth, and tenth ribs with healing fracture the left posterolateral eleventh rib. IMPRESSION: 1. Healing fractures the left seventh through eleventh ribs, possibly accounting for left flank pain. These appears subacute with some early callus formation. 2. 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 3. Atrophic right kidney. 4. Small type 1 hiatal hernia. 5. Sigmoid colon diverticulosis. 6. Broad Schmorl's nodes along the superior endplates of I96 and V89. 7. Disc bulge at L4-5 without substantial impingement. 8. Aortic atherosclerosis. Aortic Atherosclerosis (ICD10-I70.0). Electronically Signed   By: Van Clines M.D.   On: 10/09/2022 18:58   DG Chest Port 1 View  Result Date: 10/09/2022 CLINICAL DATA:  Weakness. EXAM: PORTABLE CHEST 1 VIEW COMPARISON:  Chest radiograph dated 05/15/2019. FINDINGS: There is diffuse chronic interstitial coarsening. No focal consolidation, pleural effusion, pneumothorax. The cardiac silhouette is within normal limits. No acute osseous pathology. IMPRESSION: No active disease. Electronically Signed   By: Anner Crete M.D.   On: 10/09/2022 17:44      Impression / Plan:   Traci Duran is a 66 y.o. female with history of COPD, chronic tobacco use, coronary artery disease, GERD, hiatal hernia, history of Cameron ulcers, peripheral vascular disease, history of colonic, small bowel AVMs, chronic iron deficiency anemia presents to the emergency room with back pain, generalized weakness as well as dark stools secondary to acute on chronic iron deficiency anemia  Recommend upper endoscopy and colonoscopy for further  evaluation Monitor CBC closely to maintain hemoglobin above 8 Patient responded appropriately to 1 unit of PRBCs Continue Protonix 40 mg IV twice daily S/p IV iron Start clear liquid diet today MiraLAX bowel prep ordered for today Avoid NSAID use  I have discussed alternative options, risks & benefits,  which include, but are not limited to, bleeding, infection, perforation,respiratory complication & drug reaction.  The patient agrees with this plan & written consent will be obtained.     Thank you for involving me in the care of this patient.  GI will follow along with you    LOS: 1 day   Sherri Sear, MD  10/10/2022, 4:08 PM    Note: This dictation was prepared with Dragon dictation along with smaller phrase technology. Any transcriptional errors that result from this process are unintentional.

## 2022-10-10 NOTE — Progress Notes (Addendum)
PT Cancellation Note  Patient Details Name: Traci Duran MRN: [id number] DOB: 1956-10-03   Cancelled Treatment:      PT did not evaluate and treat the pt 2/2 to pt is hypotensive 76/62. Nurse advised to hold the pt for today. PT will attempt tomorrow if pt is  appropriate.   Joaquin Music PT DPT 2:38 PM,10/10/22

## 2022-10-10 NOTE — Assessment & Plan Note (Signed)
-  Continue Zetia and Lipitor 

## 2022-10-10 NOTE — Assessment & Plan Note (Signed)
-   We will continue Xanax, BuSpar and Celexa.

## 2022-10-10 NOTE — ED Notes (Signed)
Informed RN bed assigned 

## 2022-10-10 NOTE — H&P (Signed)
[place]   PATIENT NAME: Traci Duran    MR#:  [id number]  DATE OF BIRTH:  27-Feb-1956  DATE OF ADMISSION:  10/09/2022  PRIMARY CARE PHYSICIAN: [place], [facility]   Patient is coming from: Home  REQUESTING/REFERRING PHYSICIAN: Pilar Jarvis, MD  CHIEF COMPLAINT:   Chief Complaint  Patient presents with   Back Pain    HISTORY OF PRESENT ILLNESS:  Traci Duran is a 66 y.o. Caucasian female with medical history significant for COPD, coronary artery disease, GERD, hypertension, dyslipidemia and peripheral vascular disease as well as systolic CHF, presented to emergency room with acute onset of bilateral back pain mainly on the left as well as fatigue and dizziness with generalized weakness while ambulating.  She admits to diarrhea over the last couple weeks with loose bowel movements without bright red bleeding per rectum however with melena.  She admits to heartburn without nausea or vomiting.  No cough or wheezing or hemoptysis.  No other bleeding diathesis.  No chest pain or palpitations.  No dysuria, oliguria or hematuria or flank pain.  ED Course: When she had the ER, vital signs were within normal.  Labs revealed mild hyponatremia of 132 with a BUN of 24 and creatinine 1.73 up from 1.06 on 02/11/2022.  High-sensitivity troponin I was 7.  CBC showed hemoglobin of 8.1 and hematocrit 25.4 compared to 7.7 and 26.5 on 02/11/2022 and 10.7/33.9 on 06/17/2021.  UA showed a specific gravity 1003 and positive nitrite with many bacteria with 0-5 WBCs. EKG as reviewed by me : EKG showed normal sinus rhythm rate of 81 Imaging: Portable chest x-ray showed no acute cardiopulmonary disease.  Abdominal and pelvic CT scan revealed the following: 1. Healing fractures the left seventh through eleventh ribs, possibly accounting for left flank pain. These appears subacute with some early callus formation. 2. Extensive atheromatous vascular disease including the coronary arteries, celiac  trunk, SMA, IMA, common iliac arteries, external iliac arteries, and superficial femoral arteries. 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 3. Atrophic right kidney. 4. Small type 1 hiatal hernia. 5. Sigmoid colon diverticulosis. 6. Broad Schmorl's nodes along the superior endplates of T11 and T12. 7. Disc bulge at L4-5 without substantial impingement. 8. Aortic atherosclerosis.  The patient was given 40 mg IV Protonix and 1 p.o. Percocet.  She will be admitted to a program of unit bed for further evaluation and management PAST MEDICAL HISTORY:   Past Medical History:  Diagnosis Date   Anemia 05/2020   Thought to be related to GI bleed.   Anxiety    Bilateral carotid artery stenosis without cerebral infarction 10/2010    November 2011, CTA of the head and neck: Near complete occlusion versus severe stenosis of nearly the entire basilar artery, complete occlusion of the right vertebral artery from its origin off the right SCA, moderate noncalcified atherosclerotic in the proximal left SCA which results in a 40% stenosis. Right ICA with approximately 15% stenosis, left   Nov. 2012 Carotid Duplex: >75% diameter re   CHF (congestive heart failure), chronic HFrEF NYHA class II, chronic, diastolic (HCC)    Chronic HFrEF   COPD (chronic obstructive pulmonary disease) (HCC)    Coronary artery disease, non-occlusive 10/2009   Cardiac Cath: EF 85%;  40% prox &mid RCA -> 80% RPDA and AMrg, 60% RPAV.  40% prox &  distal LAD w. 60% dLAD and D2, - Med Rx; Myoview 9/19: Small size, Mod severity Apical Anterior defect  c/w ISCHEMIA. -> INTERMEDIATE RISK (med Rx because of anemia, and known distal LAD and PDA disease. ->  Similar defect noted in August 2009)   Depression    Gastritis    GERD (gastroesophageal reflux disease)    Hemorrhoids     Hyperlipidemia    Hypertension    Mild aortic stenosis by prior echocardiogram 06/2020   Mild aortic stenosis by echo   Moderate to severe mitral regurgitation 06/2020   Echo at [facility]: Moderate-severe MR with mild RV enlargement and severely elevated PA 95 mmHg.   Myocardial infarction [facility])    Demand ischemia infarction initially in 2010   PAD (peripheral artery disease) (HCC) 07/2008   a) 8/'09: normal ABIs; b) post procedure Dopplers December 2010: Suggestion of left PFA/CFA-SFV AV fistula.; c) 02/2010: Left CFA-PFA AV fistula still present.; d) 04/2010: CTA Abd/Pelvis- LE Runoff: Extensive Mixed plaque in the Abd Ao.  Patent visceral As.  Bilat 2V runnoff-feet ATA & Peroneal A;  (confirmed by Arteriogram 03/2011); e) ABIs October 2017 R ABI 0.88, L ABI 0.79.   Pulmonary hypertension (HCC) 06/2020   [facility] echo shows moderate-severe MR, moderate to severe TR with RVP estimated 95 mmHg.   Stenosis of left subclavian artery (HCC) 10/27/2010   ~75% by recent doppler -- CHECK BP ON R ARM (or bilaterally for confirmation)    PAST SURGICAL HISTORY:   Past Surgical History:  Procedure Laterality Date   APPENDECTOMY     COLONOSCOPY WITH PROPOFOL N/A 06/09/2017   Procedure: COLONOSCOPY WITH PROPOFOL;  Surgeon: Midge Minium, MD;  Location: [facility] ENDOSCOPY;  Service: Endoscopy;  Laterality: N/A;   COLONOSCOPY WITH PROPOFOL N/A 10/08/2019   Procedure: COLONOSCOPY WITH PROPOFOL;  Surgeon: Midge Minium, MD;  Location: [facility] ENDOSCOPY;  Service: Endoscopy;  Laterality: N/A;   ENTEROSCOPY N/A 10/08/2019   Procedure: ENTEROSCOPY;  Surgeon: Midge Minium, MD;  Location: [facility] ENDOSCOPY;  Service: Endoscopy;  Laterality: N/A;   ENTEROSCOPY N/A 07/18/2020   Procedure: ENTEROSCOPY;  Surgeon: Toney Reil, MD;  Location: [facility] ENDOSCOPY;  Service: Gastroenterology;  Laterality: N/A;   ESOPHAGOGASTRODUODENOSCOPY N/A 07/30/2017   Procedure: ESOPHAGOGASTRODUODENOSCOPY (EGD);  Surgeon: Toney Reil,  MD;  Location: [facility] ENDOSCOPY;  Service: Gastroenterology;  Laterality: N/A;   ESOPHAGOGASTRODUODENOSCOPY (EGD) WITH PROPOFOL N/A 04/09/2017   Procedure: ESOPHAGOGASTRODUODENOSCOPY (EGD) WITH PROPOFOL;  Surgeon: Midge Minium, MD;  Location: [facility] ENDOSCOPY;  Service: Endoscopy;  Laterality: N/A;   GIVENS CAPSULE STUDY N/A 10/08/2019   Procedure: GIVENS CAPSULE STUDY;  Surgeon: Midge Minium, MD;  Location: [facility] ENDOSCOPY;  Service: Endoscopy;  Laterality: N/A;   HIP FRACTURE SURGERY     LEFT HEART CATH AND CORONARY ANGIOGRAPHY  10/2009   [facility]: EF 85%;  40% prox &mid RCA -> 80% RPDA and AMrg, 60% RPAV.  40% prox & distal LAD w. 60% dLAD and D2, - Med Rx   LOWER EXTREMITY ARTERIOGRAM Bilateral 03/28/2011   ([facility]) RLE: moderate focal prox. R> CIA stenosis, patent PFA, SFA, CFA, 2 vessel runoff via PT/peroneal, AT proximally occluded. LLE: moderate CFA stenosis, patent PFA, SFA and PA, 2 vessel runoff via AT/peroneal, PT occluded.   NM GATED MYOVIEW ([facility] HX)  08/2018   Small sized mild severity defect in the apical anterior wall concerning for ischemia.  INTERMEDIATE RISK. (Appears to be no change from 2009)   NM MYOVIEW LTD  07/2008   ([facility]): A) 07/2008: Minimal  reversible defect in the apex with possible ischemia.  Likely represents artifact.  EF 59% -> Cath: 80% PDA, 60% PAV and distal LAD dZ); B) 08/2010: Lexiscan - > ischemia the Ant & Inf Apex.  EF 64%.(Med Rx); C) Baltazar Pekala 2013: + Apical Ischemia - unchanged; D) Hilberto Burzynski 2016: EF 76%. No Ischemia /Infarct.   TRANSTHORACIC ECHOCARDIOGRAM  11/2009   [facility]) A) December 2010, 2D echo: EF >55%, mild concentric LVH, diastolic dysfunction Gr. I, LVIDd 4.2 cm, LVIDs 2.3 cm, mild TR (peak RVP 28 mmHg).; B) November 2011, 2D echo: EF >55%, mild concentric LVH, diastolic dysfunction Gr. I, LVIDd 4.3 cm, LVIDs 1.7 cm, mild PR (peak RVP 25 mmHg).; C) 12/2014 Echo: EF >55%, mild LVH, DD gr. I, LAE 3.9cm, Moderate TR (PRVP 35 mmHg)    TRANSTHORACIC ECHOCARDIOGRAM  06/13/2020   [facility]: EF 60-65%.  Normal LV function.  Indeterminate diastolic pressures.  Mild RV enlargement with severely elevated PAP estimated 95. moderate biatrial dilation.  Moderate to severe MR.  Moderate to severe TR.  Mild AS.    SOCIAL HISTORY:   Social History   Tobacco Use   Smoking status: Every Day    Packs/day: 1.00    Years: 46.00    Total pack years: 46.00    Types: Cigarettes   Smokeless tobacco: Never   Tobacco comments:    0.5PPD 02/17/2022  Substance Use Topics   Alcohol use: No    FAMILY HISTORY:   Family History  Problem Relation Age of Onset   Heart failure Mother    Heart attack Mother        mother died at 38 of an MI   Heart attack Father        father died at 78 of an MI   Heart attack Brother        Brother had an MI in his 34s    DRUG ALLERGIES:   Allergies  Allergen Reactions   Chantix [Varenicline] Hives, Shortness Of Breath and Palpitations   Diphenhydramine Hcl Shortness Of Breath   Benadryl [Diphenhydramine Hcl] Rash   Niacin Rash   Trazodone Palpitations    REVIEW OF SYSTEMS:   ROS As per history of present illness. All pertinent systems were reviewed above. Constitutional, HEENT, cardiovascular, respiratory, GI, GU, musculoskeletal, neuro, psychiatric, endocrine, integumentary and hematologic systems were reviewed and are otherwise negative/unremarkable except for positive findings mentioned above in the HPI.   MEDICATIONS AT HOME:   Prior to Admission medications   Medication Sig Start Date End Date Taking? Authorizing Provider  albuterol (PROVENTIL) (2.5 MG/3ML) 0.083% nebulizer solution Take 3 mLs (2.5 mg total) by nebulization every 6 (six) hours as needed for wheezing or shortness of breath. 05/18/19  Yes Mayo, Allyn Kenner, MD  alprazolam Prudy Feeler) 2 MG tablet Take 2 mg by mouth 2 (two) times daily.    Yes [provider]  atorvastatin (LIPITOR) 80 MG tablet Take 80 mg by mouth daily.    Yes [provider]  busPIRone (BUSPAR) 10 MG tablet Take 10 mg by mouth 2 (two) times daily.    Yes [provider]  carvedilol (COREG) 6.25 MG tablet Take 6.25 mg by mouth 2 (two) times daily. 05/28/17  Yes [provider]  citalopram (CELEXA) 40 MG tablet Take 40 mg by mouth daily.   Yes [provider]  DULoxetine (CYMBALTA) 60 MG capsule Take 60 mg by mouth daily. 09/19/21  Yes [provider]  ezetimibe (ZETIA) 10 MG tablet Take  1 tablet (10 mg total) by mouth daily. 09/05/22  Yes Marykay Lex, MD  furosemide (LASIX) 20 MG tablet Take 1 tablet (20 mg total) by mouth daily. 02/11/22  Yes Agbor-Etang, Arlys John, MD  gabapentin (NEURONTIN) 400 MG capsule Take 400 mg by mouth 3 (three) times daily. 09/23/21  Yes [provider]  isosorbide mononitrate (IMDUR) 30 MG 24 hr tablet Take 1 tablet (30 mg total) by mouth daily. 06/13/20  Yes Debbe Odea, MD  lisinopril (ZESTRIL) 10 MG tablet TAKE ONE TABLET BY MOUTH EVERY DAY 03/11/22  Yes Marykay Lex, MD  pantoprazole (PROTONIX) 40 MG tablet Take 1 tablet (40 mg total) by mouth daily. 10/09/22 10/09/23 Yes Pilar Jarvis, MD  ferrous gluconate (FERGON) 324 MG tablet Take 1 tablet (324 mg total) by mouth daily with breakfast. Patient not taking: Reported on 10/09/2022 07/19/20   Swayze, Ava, DO  ipratropium-albuterol (DUONEB) 0.5-2.5 (3) MG/3ML SOLN Take 3 mLs by nebulization every 6 (six) hours as needed. 05/18/19   Jimmye Norman, NP  nitroGLYCERIN (NITROSTAT) 0.4 MG SL tablet Place 0.4 mg under the tongue as needed. 10/06/19   [provider]  SYMBICORT 80-4.5 MCG/ACT inhaler Inhale 2 puffs into the lungs 2 (two) times daily. Patient not taking: Reported on 10/09/2022 08/12/18   [provider]      VITAL SIGNS:  Blood pressure 121/70, pulse (!) 58, temperature 97.9 F (36.6 C), temperature source Oral, resp. rate 18, SpO2 96 %.  PHYSICAL EXAMINATION:  Physical  Exam  GENERAL:  66 y.o.-year-old Caucasian female patient lying in the bed with no acute distress.  EYES: Pupils equal, round, reactive to light and accommodation. No scleral icterus.  Positive pallor.  Extraocular muscles intact.  HEENT: Head atraumatic, normocephalic. Oropharynx and nasopharynx clear.  NECK:  Supple, no jugular venous distention. No thyroid enlargement, no tenderness.  LUNGS: Normal breath sounds bilaterally, no wheezing, rales,rhonchi or crepitation. No use of accessory muscles of respiration.  CARDIOVASCULAR: Regular rate and rhythm, S1, S2 normal. No murmurs, rubs, or gallops.  ABDOMEN: Soft, nondistended, nontender. Bowel sounds present. No organomegaly or mass.  EXTREMITIES: No pedal edema, cyanosis, or clubbing.  NEUROLOGIC: Cranial nerves II through XII are intact. Muscle strength 5/5 in all extremities. Sensation intact. Gait not checked.  PSYCHIATRIC: The patient is alert and oriented x 3.  Normal affect and good eye contact. SKIN: No obvious rash, lesion, or ulcer.   LABORATORY PANEL:   CBC Recent Labs  Lab 10/09/22 1711 10/09/22 2033  WBC 8.8  --   HGB 8.1* 8.1*  HCT 25.4* 25.5*  PLT 346  --    ------------------------------------------------------------------------------------------------------------------  Chemistries  Recent Labs  Lab 10/09/22 1711  NA 132*  K 3.8  CL 98  CO2 23  GLUCOSE 72  BUN 24*  CREATININE 1.73*  CALCIUM 8.9  AST 21  ALT 13  ALKPHOS 104  BILITOT 0.6   ------------------------------------------------------------------------------------------------------------------  Cardiac Enzymes No results for input(s): "TROPONINI" in the last 168 hours. ------------------------------------------------------------------------------------------------------------------  RADIOLOGY:  CT Abdomen Pelvis W Contrast  Result Date: 10/09/2022 CLINICAL DATA:  Flank pain, left back pain. EXAM: CT ABDOMEN AND PELVIS WITH CONTRAST  TECHNIQUE: Multidetector CT imaging of the abdomen and pelvis was performed using the standard protocol following bolus administration of intravenous contrast. RADIATION DOSE REDUCTION: This exam was performed according to the departmental dose-optimization program which includes automated exposure control, adjustment of the mA and/or kV according to patient size and/or use of iterative reconstruction technique. CONTRAST:  75mL  OMNIPAQUE IOHEXOL 300 MG/ML  SOLN COMPARISON:  CT pelvis 11/13/2019 FINDINGS: Lower chest: Circumflex and right coronary artery atherosclerosis with descending thoracic aortic atherosclerosis. Small type 1 hiatal hernia. Hepatobiliary: Contracted gallbladder. No significant hepatic parenchymal lesion identified. Pancreas: Unremarkable Spleen: Numerous punctate calcifications in the spleen compatible with old granulomatous disease. Adrenals/Urinary Tract: Adrenal glands unremarkable. Atrophic right kidney with vascular calcifications along the right renal hilum. Vascular calcification along the margin of the right distal ureter but not within the right ureter. There vascular calcifications of the left renal hilum. No urinary tract calculi observed. Urinary bladder unremarkable. Stomach/Bowel: Fatty prominence of the wall of the ascending colon, this is frequently incidental but can have a weak association with inflammatory bowel disease. No findings of inflammation involving the terminal ileum. Sigmoid colon diverticulosis without findings of acute diverticulitis. Vascular/Lymphatic: Atherosclerosis is present, including aortoiliac atherosclerotic disease. Extensive atheromatous plaque in the celiac trunk along with notable atheromatous plaque narrowing the proximal SMA as well, and with atherosclerotic calcification in distal SMA branches. Neither vessel appears overtly occluded but the degree of stenosis is considerable. There is atheromatous vascular disease in the proximal IMA which does  not appear overtly occluded either. Prominent atherosclerotic vascular calcification and atherosclerotic narrowing in the common iliac and external iliac arteries and extending into the superficial femoral arteries. Please note that today's exam is not a CT angiogram. No pathologic adenopathy is observed. Reproductive: Unremarkable Other: No supplemental non-categorized findings. Musculoskeletal: Left femoral nail with femoral head lag screw. Broad Schmorl's nodes along the superior endplates of T11 and T12. No lumbar spine fracture or subluxation. There is likely a disc bulge at L4-5 but no substantial impingement. No impinging lesion observed along the sacral plexus or proximal sciatic nerves. Healing fractures of the left lateral seventh, eighth, ninth, and tenth ribs with healing fracture the left posterolateral eleventh rib. IMPRESSION: 1. Healing fractures the left seventh through eleventh ribs, possibly accounting for left flank pain. These appears subacute with some early callus formation. 2. 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 3. Atrophic right kidney. 4. Small type 1 hiatal hernia. 5. Sigmoid colon diverticulosis. 6. Broad Schmorl's nodes along the superior endplates of T11 and T12. 7. Disc bulge at L4-5 without substantial impingement. 8. Aortic atherosclerosis. Aortic Atherosclerosis (ICD10-I70.0). Electronically Signed   By: Gaylyn Rong M.D.   On: 10/09/2022 18:58   DG Chest Port 1 View  Result Date: 10/09/2022 CLINICAL DATA:  Weakness. EXAM: PORTABLE CHEST 1 VIEW COMPARISON:  Chest radiograph dated 05/15/2019. FINDINGS: There is diffuse chronic  interstitial coarsening. No focal consolidation, pleural effusion, pneumothorax. The cardiac silhouette is within normal limits. No acute osseous pathology. IMPRESSION: No active disease. Electronically Signed   By: Elgie Collard M.D.   On: 10/09/2022 17:44      IMPRESSION AND PLAN:  Assessment and Plan: * GI bleeding - The patient will be admitted to a PCU bed. - She was typed and crossmatched will be transfused 1 unit of packed red blood cells. - We will follow posttransfusion H&H as well as serial levels. - She has subsequent acute blood loss anemia on top of chronic anemia. - We will place her on IV PPI therapy with Protonix. - GI consult will be obtained. -  Dr. Tobi Bastos was notified about the patient.  AKI (acute kidney injury) (HCC) - This likely secondary to GI bleeding. - She will be hydrated with IV normal saline and will follow BMP. - We will avoid nephrotoxins.  Acute lower UTI - She will be placed on IV Rocephin. - We will follow urine culture and sensitivity.  Anxiety and depression - We will continue Xanax, BuSpar and Celexa.  Hyperlipidemia LDL goal <70 - We will continue statin therapy and Zetia.   DVT prophylaxis: SCDs.  Medical prophylaxis currently contraindicated due to GI bleeding. Advanced Care Planning:  Code Status: full code. Family Communication:  The plan of care was discussed in details with the patient (and family). I answered all questions. The patient agreed to proceed with the above mentioned plan. Further management will depend upon hospital course. Disposition Plan: Back to previous home environment Consults called: GI All the records are reviewed and case discussed with ED provider.  Status is: Inpatient   At the time of the admission, it appears that the appropriate admission status for this patient is inpatient.  This is judged to be reasonable and necessary in order to provide the required intensity of service to ensure the patient's  safety given the presenting symptoms, physical exam findings and initial radiographic and laboratory data in the context of comorbid conditions.  The patient requires inpatient status due to high intensity of service, high risk of further deterioration and high frequency of surveillance required.  I certify that at the time of admission, it is my clinical judgment that the patient will require inpatient hospital care extending more than 2 midnights.                            Dispo: The patient is from: Home              Anticipated d/c is to: Home              Patient currently is not medically stable to d/c.              Difficult to place patient: No  Hannah Beat M.D on 10/10/2022 at 3:41 AM  [facility]   From 7 PM-7 AM, contact night-coverage [email or web address]  CC: Primary care physician; [name]

## 2022-10-10 NOTE — ED Notes (Signed)
ED TO INPATIENT HANDOFF REPORT  ED Nurse Name and Phone #: Baxter Flattery, RN  S Name/Age/Gender Traci Duran 66 y.o. female Room/Bed: ED34A/ED34A  Code Status   Code Status: Full Code  Home/SNF/Other Home Patient oriented to: self, place, time, and situation Is this baseline? Yes   Triage Complete: Triage complete  Chief Complaint GI bleeding [K92.2]  Triage Note First Nurse Note:  Arrives with multiple complaints.  C/O lower left back pain, bilateral leg heaviness, and intermittent palpitations at night time when laying down.  AAOx3.  Skin warm and dry. NAD.  Pt comes with c/o dizziness, lower back pain and kidney pain. Pt state she feels dizzy when she stands. Pt states she just doesn't feel right.    Allergies Allergies  Allergen Reactions   Chantix [Varenicline] Hives, Shortness Of Breath and Palpitations   Diphenhydramine Hcl Shortness Of Breath   Benadryl [Diphenhydramine Hcl] Rash   Niacin Rash   Trazodone Palpitations    Level of Care/Admitting Diagnosis ED Disposition     ED Disposition  Admit   Condition  --   [place]: [place] [[id number]]  Level of Care: Progressive [102]  Admit to Progressive based on following criteria: GI, ENDOCRINE disease patients with GI bleeding, acute liver failure or pancreatitis, stable with diabetic ketoacidosis or thyrotoxicosis (hypothyroid) state.  Covid Evaluation: Asymptomatic - no recent exposure (last 10 days) testing not required  Diagnosis: GI bleeding [[id number]]  Admitting Physician: Christel Mormon [[id number]]  Attending Physician: Christel Mormon [[id number]]  Certification:: I certify this patient will need inpatient services for at least 2 midnights  Estimated Length of Stay: 2          B Medical/Surgery History Past Medical History:  Diagnosis Date   Anemia 05/2020   Thought to be related to GI bleed.   Anxiety    Bilateral carotid artery stenosis without cerebral infarction  10/2010    November 2011, CTA of the head and neck: Near complete occlusion versus severe stenosis of nearly the entire basilar artery, complete occlusion of the right vertebral artery from its origin off the right SCA, moderate noncalcified atherosclerotic in the proximal left SCA which results in a 40% stenosis. Right ICA with approximately 15% stenosis, left   Nov. 2012 Carotid Duplex: >75% diameter re   CHF (congestive heart failure), chronic HFrEF NYHA class II, chronic, diastolic (HCC)    Chronic HFrEF   COPD (chronic obstructive pulmonary disease) (HCC)    Coronary artery disease, non-occlusive 10/2009   Cardiac Cath: EF 85%;  40% prox &mid RCA -> 80% RPDA and AMrg, 60% RPAV.  40% prox & distal LAD w. 60% dLAD and D2, - Med Rx; Myoview 9/19: Small size, Mod severity Apical Anterior defect  c/w ISCHEMIA. -> INTERMEDIATE RISK (med Rx because of anemia, and known distal LAD and PDA disease. ->  Similar defect noted in August 2009)   Depression    Gastritis    GERD (gastroesophageal reflux disease)    Hemorrhoids    Hyperlipidemia    Hypertension    Mild aortic stenosis by prior echocardiogram 06/2020   Mild aortic stenosis by echo   Moderate to severe mitral regurgitation 06/2020   Echo at [facility]: Moderate-severe MR with mild RV enlargement and severely elevated PA 95 mmHg.   Myocardial infarction [facility])    Demand ischemia infarction initially in 2010   PAD (peripheral artery disease) ([place]) 07/2008   a) 8/'09: normal ABIs; b) post procedure Dopplers  December 2010: Suggestion of left PFA/CFA-SFV AV fistula.; c) 02/2010: Left CFA-PFA AV fistula still present.; d) 04/2010: CTA Abd/Pelvis- LE Runoff: Extensive Mixed plaque in the Abd Ao.  Patent visceral As.  Bilat 2V runnoff-feet ATA & Peroneal A;  (confirmed by Arteriogram 03/2011); e) ABIs October 2017 R ABI 0.88, L ABI 0.79.   Pulmonary hypertension ([place]) 06/2020   [facility] echo shows moderate-severe MR, moderate to severe TR with RVP estimated 95  mmHg.   Stenosis of left subclavian artery ([place]) 10/27/2010   ~75% by recent doppler -- CHECK BP ON R ARM (or bilaterally for confirmation)   Past Surgical History:  Procedure Laterality Date   APPENDECTOMY     COLONOSCOPY WITH PROPOFOL N/A 06/09/2017   Procedure: COLONOSCOPY WITH PROPOFOL;  Surgeon: Lucilla Lame, MD;  Location: [facility] ENDOSCOPY;  Service: Endoscopy;  Laterality: N/A;   COLONOSCOPY WITH PROPOFOL N/A 10/08/2019   Procedure: COLONOSCOPY WITH PROPOFOL;  Surgeon: Lucilla Lame, MD;  Location: [facility] ENDOSCOPY;  Service: Endoscopy;  Laterality: N/A;   ENTEROSCOPY N/A 10/08/2019   Procedure: ENTEROSCOPY;  Surgeon: Lucilla Lame, MD;  Location: [facility] ENDOSCOPY;  Service: Endoscopy;  Laterality: N/A;   ENTEROSCOPY N/A 07/18/2020   Procedure: ENTEROSCOPY;  Surgeon: Lin Landsman, MD;  Location: [facility] ENDOSCOPY;  Service: Gastroenterology;  Laterality: N/A;   ESOPHAGOGASTRODUODENOSCOPY N/A 07/30/2017   Procedure: ESOPHAGOGASTRODUODENOSCOPY (EGD);  Surgeon: Lin Landsman, MD;  Location: [facility] ENDOSCOPY;  Service: Gastroenterology;  Laterality: N/A;   ESOPHAGOGASTRODUODENOSCOPY (EGD) WITH PROPOFOL N/A 04/09/2017   Procedure: ESOPHAGOGASTRODUODENOSCOPY (EGD) WITH PROPOFOL;  Surgeon: Lucilla Lame, MD;  Location: [facility] ENDOSCOPY;  Service: Endoscopy;  Laterality: N/A;   GIVENS CAPSULE STUDY N/A 10/08/2019   Procedure: GIVENS CAPSULE STUDY;  Surgeon: Lucilla Lame, MD;  Location: [facility] ENDOSCOPY;  Service: Endoscopy;  Laterality: N/A;   HIP FRACTURE SURGERY     LEFT HEART CATH AND CORONARY ANGIOGRAPHY  10/2009   [facility]: EF 85%;  40% prox &mid RCA -> 80% RPDA and AMrg, 60% RPAV.  40% prox & distal LAD w. 60% dLAD and D2, - Med Rx   LOWER EXTREMITY ARTERIOGRAM Bilateral 03/28/2011   ([facility]) RLE: moderate focal prox. R> CIA stenosis, patent PFA, SFA, CFA, 2 vessel runoff via PT/peroneal, AT proximally occluded. LLE: moderate CFA stenosis, patent PFA, SFA and PA, 2 vessel runoff via  AT/peroneal, PT occluded.   NM GATED MYOVIEW ([place] [facility])  08/2018   Small sized mild severity defect in the apical anterior wall concerning for ischemia.  INTERMEDIATE RISK. (Appears to be no change from 2009)   NM MYOVIEW LTD  07/2008   ([facility]): A) 07/2008: Minimal reversible defect in the apex with possible ischemia.  Likely represents artifact.  EF 59% -> Cath: 80% PDA, 60% PAV and distal LAD dZ); B) 08/2010: Lexiscan - > ischemia the Ant & Inf Apex.  EF 64%.(Med Rx); C) Jan 2013: + Apical Ischemia - unchanged; D) Jan 2016: EF 76%. No Ischemia /Infarct.   TRANSTHORACIC ECHOCARDIOGRAM  11/2009   ([place]) A) December 2010, 2D echo: EF >55%, mild concentric LVH, diastolic dysfunction Gr. I, LVIDd 4.2 cm, LVIDs 2.3 cm, mild TR (peak RVP 28 mmHg).; B) November 2011, 2D echo: EF >55%, mild concentric LVH, diastolic dysfunction Gr. I, LVIDd 4.3 cm, LVIDs 1.7 cm, mild PR (peak RVP 25 mmHg).; C) 12/2014 Echo: EF >55%, mild LVH, DD gr. I, LAE 3.9cm, Moderate TR (PRVP 35 mmHg)   TRANSTHORACIC ECHOCARDIOGRAM  06/13/2020   [facility]: EF 60-65%.  Normal LV function.  Indeterminate diastolic pressures.  Mild RV enlargement with severely elevated PAP estimated 95. moderate biatrial dilation.  Moderate to severe MR.  Moderate to severe TR.  Mild AS.     A IV Location/Drains/Wounds Patient Lines/Drains/Airways Status     Active Line/Drains/Airways     Name Placement date Placement time Site Days   Peripheral IV 10/09/22 20 G Left Antecubital 10/09/22  --  Antecubital  1   Peripheral IV 10/10/22 20 G Anterior;Left Forearm 10/10/22  0450  Forearm  less than 1            Intake/Output Last 24 hours  Intake/Output Summary (Last 24 hours) at 10/10/2022 1129 Last data filed at 10/10/2022 0529 Gross per 24 hour  Intake 510 ml  Output --  Net 510 ml    Labs/Imaging Results for orders placed or performed during the hospital encounter of 10/09/22 (from the past 48 hour(s))   Comprehensive metabolic panel     Status: Abnormal   Collection Time: 10/09/22  5:11 PM  Result Value Ref Range   Sodium 132 (L) 135 - 145 mmol/L   Potassium 3.8 3.5 - 5.1 mmol/L   Chloride 98 98 - 111 mmol/L   CO2 23 22 - 32 mmol/L   Glucose, Bld 72 70 - 99 mg/dL    Comment: Glucose reference range applies only to samples taken after fasting for at least 8 hours.   BUN 24 (H) 8 - 23 mg/dL   Creatinine, Ser 1.73 (H) 0.44 - 1.00 mg/dL   Calcium 8.9 8.9 - 10.3 mg/dL   Total Protein 7.0 6.5 - 8.1 g/dL   Albumin 3.6 3.5 - 5.0 g/dL   AST 21 15 - 41 U/L   ALT 13 0 - 44 U/L   Alkaline Phosphatase 104 38 - 126 U/L   Total Bilirubin 0.6 0.3 - 1.2 mg/dL   GFR, Estimated 32 (L) >60 mL/min    Comment: (NOTE) Calculated using the CKD-EPI Creatinine Equation (2021)    Anion gap 11 5 - 15    Comment: Performed at [facility], [address] [place], [place] [address]  Lipase, blood     Status: None   Collection Time: 10/09/22  5:11 PM  Result Value Ref Range   Lipase 41 11 - 51 U/L    Comment: Performed at [facility], [address] [place],  [address]  CBC with Differential     Status: Abnormal   Collection Time: 10/09/22  5:11 PM  Result Value Ref Range   WBC 8.8 4.0 - 10.5 K/uL   RBC 2.98 (L) 3.87 - 5.11 MIL/uL   Hemoglobin 8.1 (L) 12.0 - 15.0 g/dL   HCT 25.4 (L) 36.0 - 46.0 %   MCV 85.2 80.0 - 100.0 fL   MCH 27.2 26.0 - 34.0 pg   MCHC 31.9 30.0 - 36.0 g/dL   RDW 13.6 11.5 - 15.5 %   Platelets 346 150 - 400 K/uL   nRBC 0.0 0.0 - 0.2 %   Neutrophils Relative % 62 %   Neutro Abs 5.5 1.7 - 7.7 K/uL   Lymphocytes Relative 29 %   Lymphs Abs 2.6 0.7 - 4.0 K/uL   Monocytes Relative 6 %   Monocytes Absolute 0.6 0.1 - 1.0 K/uL   Eosinophils Relative 2 %   Eosinophils Absolute 0.1 0.0 - 0.5 K/uL   Basophils Relative 1 %   Basophils Absolute 0.1 0.0 - 0.1 K/uL   Immature Granulocytes 0 %  Abs Immature Granulocytes 0.03 0.00 - 0.07 K/uL    Comment:  Performed at [facility], [address] [place], [place] [address]  Troponin I (High Sensitivity)     Status: None   Collection Time: 10/09/22  5:11 PM  Result Value Ref Range   Troponin I (High Sensitivity) 7 <18 ng/L    Comment: (NOTE) Elevated high sensitivity troponin I (hsTnI) values and significant  changes across serial measurements may suggest ACS but many other  chronic and acute conditions are known to elevate hsTnI results.  Refer to the "Links" section for chest pain algorithms and additional  guidance. Performed at [facility], [address] [place], [place] [address]   Prepare RBC (crossmatch)     Status: None   Collection Time: 10/09/22  6:28 PM  Result Value Ref Range   Order Confirmation      ORDER PROCESSED BY BLOOD BANK Performed at [facility], [address] [place], [place] [address]   Urinalysis, Routine w reflex microscopic Urine, Clean Catch     Status: Abnormal   Collection Time: 10/09/22  7:17 PM  Result Value Ref Range   Color, Urine YELLOW (A) YELLOW   APPearance HAZY (A) CLEAR   Specific Gravity, Urine 1.003 (L) 1.005 - 1.030   pH 5.0 5.0 - 8.0   Glucose, UA NEGATIVE NEGATIVE mg/dL   Hgb urine dipstick NEGATIVE NEGATIVE   Bilirubin Urine NEGATIVE NEGATIVE   Ketones, ur NEGATIVE NEGATIVE mg/dL   Protein, ur NEGATIVE NEGATIVE mg/dL   Nitrite POSITIVE (A) NEGATIVE   Leukocytes,Ua NEGATIVE NEGATIVE   RBC / HPF 0-5 0 - 5 RBC/hpf   WBC, UA 0-5 0 - 5 WBC/hpf   Bacteria, UA MANY (A) NONE SEEN   Squamous Epithelial / LPF 0-5 0 - 5    Comment: Performed at [facility], [place], [place] [address]  Troponin I (High Sensitivity)     Status: None   Collection Time: 10/09/22  7:17 PM  Result Value Ref Range   Troponin I (High Sensitivity) 9 <18 ng/L    Comment: (NOTE) Elevated high sensitivity troponin I (hsTnI) values and significant  changes across serial measurements may suggest ACS but  many other  chronic and acute conditions are known to elevate hsTnI results.  Refer to the "Links" section for chest pain algorithms and additional  guidance. Performed at [facility], [address] [place],  [address]   Type and screen     Status: None   Collection Time: 10/09/22  8:33 PM  Result Value Ref Range   ABO/RH(D) O POS    Antibody Screen NEG    Sample Expiration 10/12/2022,2359    Unit Number [id number]    Blood Component Type RED CELLS,LR    Unit division 00    Status of Unit ISSUED,FINAL    Transfusion Status OK TO TRANSFUSE    Crossmatch Result      Compatible Performed at [facility], [place],  [address]   HIV Antibody (routine testing w rflx)     Status: None   Collection Time: 10/09/22  8:33 PM  Result Value Ref Range   HIV Screen 4th Generation wRfx Non Reactive Non Reactive    Comment: Performed at [facility], [place] [address], [place],  [address]  Hemoglobin and hematocrit, blood     Status: Abnormal   Collection Time: 10/09/22  8:33 PM  Result Value Ref  Range   Hemoglobin 8.1 (L) 12.0 - 15.0 g/dL   HCT 25.5 (L) 36.0 - 46.0 %    Comment: Performed at [facility], [address] [place], [place] [address]  Basic metabolic panel     Status: Abnormal   Collection Time: 10/10/22  4:49 AM  Result Value Ref Range   Sodium 136 135 - 145 mmol/L   Potassium 3.6 3.5 - 5.1 mmol/L   Chloride 104 98 - 111 mmol/L   CO2 25 22 - 32 mmol/L   Glucose, Bld 88 70 - 99 mg/dL    Comment: Glucose reference range applies only to samples taken after fasting for at least 8 hours.   BUN 24 (H) 8 - 23 mg/dL   Creatinine, Ser 1.74 (H) 0.44 - 1.00 mg/dL   Calcium 8.8 (L) 8.9 - 10.3 mg/dL   GFR, Estimated 32 (L) >60 mL/min    Comment: (NOTE) Calculated using the CKD-EPI Creatinine Equation (2021)    Anion gap 7 5 - 15    Comment: Performed at [facility], [address]  [place], [place] [address]  CBC     Status: Abnormal   Collection Time: 10/10/22  4:49 AM  Result Value Ref Range   WBC 7.0 4.0 - 10.5 K/uL   RBC 3.45 (L) 3.87 - 5.11 MIL/uL   Hemoglobin 9.5 (L) 12.0 - 15.0 g/dL   HCT 29.3 (L) 36.0 - 46.0 %   MCV 84.9 80.0 - 100.0 fL   MCH 27.5 26.0 - 34.0 pg   MCHC 32.4 30.0 - 36.0 g/dL   RDW 13.6 11.5 - 15.5 %   Platelets 268 150 - 400 K/uL   nRBC 0.0 0.0 - 0.2 %    Comment: Performed at [facility], [address] [place], [place] [address]  Iron and TIBC     Status: Abnormal   Collection Time: 10/10/22  8:44 AM  Result Value Ref Range   Iron 21 (L) 28 - 170 ug/dL   TIBC 328 250 - 450 ug/dL   Saturation Ratios 6 (L) 10.4 - 31.8 %   UIBC 307 ug/dL    Comment: Performed at [facility], [address] [place], [place] [address]  Ferritin     Status: Abnormal   Collection Time: 10/10/22  8:44 AM  Result Value Ref Range   Ferritin 9 (L) 11 - 307 ng/mL    Comment: Performed at [facility], [address], [place], [place] [address]  Folate     Status: None   Collection Time: 10/10/22  8:44 AM  Result Value Ref Range   Folate 6.0 >5.9 ng/mL    Comment: Performed at [facility], [address], [place], [place] [address]   CT Abdomen Pelvis W Contrast  Result Date: 10/09/2022 CLINICAL DATA:  Flank pain, left back pain. EXAM: CT ABDOMEN AND PELVIS WITH CONTRAST TECHNIQUE: Multidetector CT imaging of the abdomen and pelvis was performed using the standard protocol following bolus administration of intravenous contrast. RADIATION DOSE REDUCTION: This exam was performed according to the departmental dose-optimization program which includes automated exposure control, adjustment of the mA and/or kV according to patient size and/or use of iterative reconstruction technique. CONTRAST:  38m OMNIPAQUE IOHEXOL 300 MG/ML  SOLN COMPARISON:  CT pelvis 11/13/2019 FINDINGS: Lower chest: Circumflex and right coronary artery  atherosclerosis with descending thoracic aortic atherosclerosis. Small type 1 hiatal hernia. Hepatobiliary: Contracted gallbladder. No significant hepatic parenchymal lesion identified. Pancreas: Unremarkable Spleen: Numerous  punctate calcifications in the spleen compatible with old granulomatous disease. Adrenals/Urinary Tract: Adrenal glands unremarkable. Atrophic right kidney with vascular calcifications along the right renal hilum. Vascular calcification along the margin of the right distal ureter but not within the right ureter. There vascular calcifications of the left renal hilum. No urinary tract calculi observed. Urinary bladder unremarkable. Stomach/Bowel: Fatty prominence of the wall of the ascending colon, this is frequently incidental but can have a weak association with inflammatory bowel disease. No findings of inflammation involving the terminal ileum. Sigmoid colon diverticulosis without findings of acute diverticulitis. Vascular/Lymphatic: Atherosclerosis is present, including aortoiliac atherosclerotic disease. Extensive atheromatous plaque in the celiac trunk along with notable atheromatous plaque narrowing the proximal SMA as well, and with atherosclerotic calcification in distal SMA branches. Neither vessel appears overtly occluded but the degree of stenosis is considerable. There is atheromatous vascular disease in the proximal IMA which does not appear overtly occluded either. Prominent atherosclerotic vascular calcification and atherosclerotic narrowing in the common iliac and external iliac arteries and extending into the superficial femoral arteries. Please note that today's exam is not a CT angiogram. No pathologic adenopathy is observed. Reproductive: Unremarkable Other: No supplemental non-categorized findings. Musculoskeletal: Left femoral nail with femoral head lag screw. Broad Schmorl's nodes along the superior endplates of W96 and E45. No lumbar spine fracture or subluxation. There  is likely a disc bulge at L4-5 but no substantial impingement. No impinging lesion observed along the sacral plexus or proximal sciatic nerves. Healing fractures of the left lateral seventh, eighth, ninth, and tenth ribs with healing fracture the left posterolateral eleventh rib. IMPRESSION: 1. Healing fractures the left seventh through eleventh ribs, possibly accounting for left flank pain. These appears subacute with some early callus formation. 2. 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 3. Atrophic right kidney. 4. Small type 1 hiatal hernia. 5. Sigmoid colon diverticulosis. 6. Broad Schmorl's nodes along the superior endplates of W09 and W11. 7. Disc bulge at L4-5 without substantial impingement. 8. Aortic atherosclerosis. Aortic Atherosclerosis (ICD10-I70.0). Electronically Signed   By: Van Clines M.D.   On: 10/09/2022 18:58   DG Chest Port 1 View  Result Date: 10/09/2022 CLINICAL DATA:  Weakness. EXAM: PORTABLE CHEST 1 VIEW COMPARISON:  Chest radiograph dated 05/15/2019. FINDINGS: There is diffuse chronic interstitial coarsening. No focal consolidation, pleural effusion, pneumothorax. The cardiac silhouette is within normal limits. No acute osseous pathology. IMPRESSION: No active disease. Electronically Signed   By: Anner Crete M.D.   On: 10/09/2022 17:44    Pending Labs Unresulted Labs (From admission, onward)     Start     Ordered   10/10/22 0812  Vitamin B12  Once,   R        10/10/22 0811   10/09/22 1711  Urine Culture  Once,   URGENT       Question:  Indication  Answer:  Flank Pain   10/09/22 1711            Vitals/Pain Today's  Vitals   10/10/22 0700 10/10/22 0924 10/10/22 1000 10/10/22 1124  BP: (!) 115/43  124/76   Pulse: 64  68   Resp: 18  16   Temp: (!) 97.5 F (36.4 C)   97.7 F (  36.5 C)  TempSrc:    Oral  SpO2: 95%  94%   PainSc: 0-No pain 3       Isolation Precautions No active isolations  Medications Medications  sodium chloride 0.9 % bolus 500 mL ( Intravenous Canceled Entry 10/09/22 1838)  0.9 %  sodium chloride infusion ( Intravenous New Bag/Given 10/09/22 2322)  acetaminophen (TYLENOL) tablet 650 mg (has no administration in time range)    Or  acetaminophen (TYLENOL) suppository 650 mg (has no administration in time range)  ondansetron (ZOFRAN) tablet 4 mg (has no administration in time range)    Or  ondansetron (ZOFRAN) injection 4 mg (has no administration in time range)  melatonin tablet 5 mg (has no administration in time range)  atorvastatin (LIPITOR) tablet 80 mg (80 mg Oral Given 10/10/22 0927)  carvedilol (COREG) tablet 6.25 mg (6.25 mg Oral Given 10/10/22 0928)  ezetimibe (ZETIA) tablet 10 mg (10 mg Oral Given 10/10/22 0928)  isosorbide mononitrate (IMDUR) 24 hr tablet 30 mg (30 mg Oral Given 10/10/22 0925)  nitroGLYCERIN (NITROSTAT) SL tablet 0.4 mg (has no administration in time range)  ALPRAZolam (XANAX) tablet 2 mg (2 mg Oral Given 10/10/22 0925)  busPIRone (BUSPAR) tablet 10 mg (10 mg Oral Given 10/10/22 0925)  citalopram (CELEXA) tablet 40 mg (40 mg Oral Given 10/10/22 0928)  DULoxetine (CYMBALTA) DR capsule 60 mg (60 mg Oral Given 10/10/22 0928)  albuterol (PROVENTIL) (2.5 MG/3ML) 0.083% nebulizer solution 2.5 mg (has no administration in time range)  ipratropium-albuterol (DUONEB) 0.5-2.5 (3) MG/3ML nebulizer solution 3 mL (has no administration in time range)  cefTRIAXone (ROCEPHIN) 1 g in sodium chloride 0.9 % 100 mL IVPB (0 g Intravenous Stopped 10/10/22 0529)  lidocaine (LIDODERM) 5 % 1 patch (1 patch Transdermal Patch Applied 10/10/22 0824)  pantoprazole (PROTONIX) injection 40  mg (40 mg Intravenous Given 10/10/22 0932)  gabapentin (NEURONTIN) capsule 600 mg (600 mg Oral Given 10/10/22 0932)  oxyCODONE (Oxy IR/ROXICODONE) immediate release tablet 5-10 mg (10 mg Oral Given 10/10/22 0840)  iohexol (OMNIPAQUE) 300 MG/ML solution 75 mL (75 mLs Intravenous Contrast Given 10/09/22 1826)  0.9 %  sodium chloride infusion (0 mL/hr Intravenous Stopped 10/10/22 0240)  pantoprazole (PROTONIX) injection 40 mg (40 mg Intravenous Given 10/09/22 1926)  oxyCODONE-acetaminophen (PERCOCET/ROXICET) 5-325 MG per tablet 1 tablet (1 tablet Oral Given 10/09/22 1926)  pantoprazole (PROTONIX) 80 mg /NS 100 mL IVPB (0 mg Intravenous Stopped 10/10/22 0522)  pantoprazole (PROTONIX) injection 40 mg (40 mg Intravenous Given 10/10/22 0446)    Mobility walks High fall risk   Focused Assessments Cardiac Assessment Handoff:    Lab Results  Component Value Date   CKTOTAL 40 03/09/2016   CKMB 2.2 10/15/2011   TROPONINI <0.03 05/16/2019   No results found for: "DDIMER" Does the Patient currently have chest pain? No    R Recommendations: See Admitting Provider Note  Report given to:   Additional Notes:

## 2022-10-10 NOTE — Evaluation (Signed)
Occupational Therapy Evaluation Patient Details Name: Traci Duran MRN: [id number] DOB: [birth year] Today's Date: 10/10/2022   History of Present Illness Traci Duran is a 66 y.o female with medical history significant for COPD, coronary artery disease, GERD, hypertension, dyslipidemia and peripheral vascular disease as well as systolic CHF, presented to emergency room with acute onset of bilateral back pain mainly on the left as well as fatigue and dizziness with generalized weakness while ambulating.   Clinical Impression   Patient presenting with decreased independence in self-care, functional mobility, safety, balance, and strength. Patient reports she lives at home with her grandson. At baseline, patient reports she is independent with ADLs, but requires some assistance with IADLs. Patient uses SPC to ambulate in the home and in the community when she's feeling lightheaded/dizzy. Patient currently functioning at supervision for bed mobility supine<>long sitting, rolling in bed, and repositioning self in bed. Further movement deferred due to low BP. (Supine) 76/62 MAP 69, 82/65 MAP 70. Formal recomendations pending further mobility RN reports patient has been ambulating to bathroom with SPC-anticipate potential discharge home with [facility])        Recommendations for follow up therapy are one component of a multi-disciplinary discharge planning process, led by the attending physician.  Recommendations may be updated based on patient status, additional functional criteria and insurance authorization.   Follow Up Recommendations  Other (comment) (formal recomendations pending further mobility RN reports patient has been ambulating to bathroom with SPC-anticipate potential discharge home with [facility])    Assistance Recommended at Discharge Frequent or constant Supervision/Assistance  Patient can return home with the following A little help with walking and/or transfers;A little help with  bathing/dressing/bathroom;Assist for transportation;Assistance with cooking/housework    Functional Status Assessment  Patient has had a recent decline in their functional status and demonstrates the ability to make significant improvements in function in a reasonable and predictable amount of time.  Equipment Recommendations  None recommended by OT       Precautions / Restrictions Precautions Precautions: Fall Precaution Comments: monitor BP Restrictions Weight Bearing Restrictions: No      Mobility Bed Mobility Overal bed mobility: Needs Assistance Bed Mobility: Rolling, Supine to Sit Rolling: Supervision   Supine to sit: Supervision     General bed mobility comments: supine to long sitting    Transfers                   General transfer comment: deferred      Balance Overall balance assessment: Needs assistance Sitting-balance support: Feet unsupported, No upper extremity supported Sitting balance-Leahy Scale: Fair         Standing balance comment: deferred                           ADL either performed or assessed with clinical judgement   ADL Overall ADL's : Needs assistance/impaired                     Lower Body Dressing: Set up Lower Body Dressing Details (indicate cue type and reason): Patient reports she was able to donn socks independently. Toilet Transfer: Radiographer, therapeutic Details (indicate cue type and reason): Per RN report patient was able to ambulate to bathroom using SPC with supervision. Toileting- Clothing Manipulation and Hygiene: Supervision/safety               Vision Patient Visual Report: No change from baseline  Pertinent Vitals/Pain Pain Assessment Pain Assessment: No/denies pain        Extremity/Trunk Assessment Upper Extremity Assessment Upper Extremity Assessment: Generalized weakness   Lower Extremity Assessment Lower Extremity Assessment: Generalized  weakness       Communication Communication Communication: No difficulties   Cognition Arousal/Alertness: Awake/alert Behavior During Therapy: WFL for tasks assessed/performed Overall Cognitive Status: Within Functional Limits for tasks assessed                                                  Home Living Family/patient expects to be discharged to:: Private residence Living Arrangements: Other relatives Technical sales engineer) Available Help at Discharge: Family;Available PRN/intermittently Type of Home: House       Home Layout: One level     Bathroom Shower/Tub: Chief Strategy Officer: Standard     Home Equipment: Cane - single Librarian, academic (2 wheels)          Prior Functioning/Environment Prior Level of Function : Independent/Modified Independent;History of Falls (last six months)             Mobility Comments: ambulates with SPC when feeling lightheaded/ dizzy. Reporting 2 falls ADLs Comments: Per batient report Independent with ADLs. Grandson assists with grocery shopping, but patient is able to cook.        OT Problem List: Decreased strength;Impaired balance (sitting and/or standing);Decreased safety awareness;Decreased activity tolerance         OT Goals(Current goals can be found in the care plan section) Acute Rehab OT Goals Patient Stated Goal: to get better OT Goal Formulation: With patient Time For Goal Achievement: 10/24/22 Potential to Achieve Goals: Good ADL Goals Pt Will Perform Lower Body Bathing: with modified independence Pt Will Perform Lower Body Dressing: with modified independence Pt Will Transfer to Toilet: with modified independence Pt Will Perform Toileting - Clothing Manipulation and hygiene: with modified independence  OT Frequency: Min 2X/week       AM-PAC OT "6 Clicks" Daily Activity     Outcome Measure Help from another person eating meals?: None Help from another person taking care of personal  grooming?: None Help from another person toileting, which includes using toliet, bedpan, or urinal?: A Little Help from another person bathing (including washing, rinsing, drying)?: A Little Help from another person to put on and taking off regular upper body clothing?: None Help from another person to put on and taking off regular lower body clothing?: A Little 6 Click Score: 21   End of Session Equipment Utilized During Treatment: Other (comment) (none) Nurse Communication: Mobility status;Other (comment) (BP)  Activity Tolerance: Treatment limited secondary to medical complications (Comment) Patient left: in bed;with call bell/phone within reach;with bed alarm set;with nursing/sitter in room  OT Visit Diagnosis: Muscle weakness (generalized) (M62.81);History of falling (Z91.81);Dizziness and giddiness (R42)                Time: 9562-1308 OT Time Calculation (min): 19 min Charges:  OT General Charges $OT Visit: 1 Visit OT Evaluation $OT Eval Low Complexity: 1 Low    Torrie Namba, OTS 10/10/2022, 3:38 PM

## 2022-10-11 DIAGNOSIS — K922 Gastrointestinal hemorrhage, unspecified: Secondary | ICD-10-CM | POA: Diagnosis not present

## 2022-10-11 DIAGNOSIS — D500 Iron deficiency anemia secondary to blood loss (chronic): Secondary | ICD-10-CM | POA: Diagnosis not present

## 2022-10-11 LAB — URINE CULTURE: Culture: >=100000 — AB

## 2022-10-11 MED ORDER — POLYETHYLENE GLYCOL 3350 17 GM/SCOOP PO POWD
1.0000 | Freq: Once | ORAL | Status: AC
  Administered 2022-10-11: 255 g via ORAL
  Filled 2022-10-11: qty 255

## 2022-10-11 MED ORDER — SODIUM CHLORIDE 0.9 % IV SOLN
300.0000 mg | Freq: Once | INTRAVENOUS | Status: AC
  Administered 2022-10-12: 300 mg via INTRAVENOUS
  Filled 2022-10-11: qty 300

## 2022-10-11 NOTE — Progress Notes (Signed)
[name], MD [address]  [place]  [place], [place] [address]  Main: [phone]  Fax: [phone] Pager: [phone]   Subjective: No acute events overnight.  She is complaining of back pain.  Apparently, patient did not receive bowel prep yesterday.  She received only 1 dose of MiraLAX last night.   Objective: Vital signs in last 24 hours: Vitals:   10/10/22 2332 10/11/22 0425 10/11/22 0752 10/11/22 1225  BP:  (!) 141/68 (!) 149/80 (!) 109/56  Pulse: 79 92 98 78  Resp:  '18 18 19  '$ Temp:  98 F (36.7 C) 98.6 F (37 C)   TempSrc:      SpO2: 90% 93% 94% 95%   Weight change:   Intake/Output Summary (Last 24 hours) at 10/11/2022 1228 Last data filed at 10/10/2022 1500 Gross per 24 hour  Intake 1773.44 ml  Output --  Net 1773.44 ml    Exam: Heart:: Regular rate and rhythm, S1S2 present, or without murmur or extra heart sounds Lungs: normal and clear to auscultation Abdomen: soft, nontender, normal bowel sounds   Lab Results:    Latest Ref Rng & Units 10/10/2022    3:14 PM 10/10/2022    4:49 AM 10/09/2022    8:33 PM  CBC  WBC 4.0 - 10.5 K/uL 6.0  7.0    Hemoglobin 12.0 - 15.0 g/dL 8.5  9.5  8.1   Hematocrit 36.0 - 46.0 % 26.8  29.3  25.5   Platelets 150 - 400 K/uL 248  268        Latest Ref Rng & Units 10/10/2022    3:14 PM 10/10/2022    4:49 AM 10/09/2022    5:11 PM  CMP  Glucose 70 - 99 mg/dL 117  88  72   BUN 8 - 23 mg/dL '20  24  24   '$ Creatinine 0.44 - 1.00 mg/dL 1.68  1.74  1.73   Sodium 135 - 145 mmol/L 135  136  132   Potassium 3.5 - 5.1 mmol/L 4.0  3.6  3.8   Chloride 98 - 111 mmol/L 106  104  98   CO2 22 - 32 mmol/L '25  25  23   '$ Calcium 8.9 - 10.3 mg/dL 8.6  8.8  8.9   Total Protein 6.5 - 8.1 g/dL   7.0   Total Bilirubin 0.3 - 1.2 mg/dL   0.6   Alkaline Phos 38 - 126 U/L   104   AST 15 - 41 U/L   21   ALT 0 - 44 U/L   13     Micro Results: Recent Results (from the past 240 hour(s))  Urine Culture     Status: Abnormal    Collection Time: 10/09/22  7:17 PM   Specimen: Urine, Clean Catch  Result Value Ref Range Status   Specimen Description   Final    URINE, CLEAN CATCH Performed at [facility], [address] [place], [place] [address]    Special Requests   Final    NONE Performed at [facility], [address] [place], [place] [address]    Culture >=100,000 COLONIES/mL KLEBSIELLA PNEUMONIAE (A)  Final   Report Status 10/11/2022 FINAL  Final   Organism ID, Bacteria KLEBSIELLA PNEUMONIAE (A)  Final      Susceptibility   Klebsiella pneumoniae - MIC*    AMPICILLIN RESISTANT Resistant     CEFAZOLIN <=4 SENSITIVE Sensitive     CEFEPIME <=0.12 SENSITIVE Sensitive  CEFTRIAXONE <=0.25 SENSITIVE Sensitive     CIPROFLOXACIN <=0.25 SENSITIVE Sensitive     GENTAMICIN <=1 SENSITIVE Sensitive     IMIPENEM <=0.25 SENSITIVE Sensitive     NITROFURANTOIN <=16 SENSITIVE Sensitive     TRIMETH/SULFA <=20 SENSITIVE Sensitive     AMPICILLIN/SULBACTAM 4 SENSITIVE Sensitive     PIP/TAZO <=4 SENSITIVE Sensitive     * >=100,000 COLONIES/mL KLEBSIELLA PNEUMONIAE   Studies/Results: CT Abdomen Pelvis W Contrast  Result Date: 10/09/2022 CLINICAL DATA:  Flank pain, left back pain. EXAM: CT ABDOMEN AND PELVIS WITH CONTRAST TECHNIQUE: Multidetector CT imaging of the abdomen and pelvis was performed using the standard protocol following bolus administration of intravenous contrast. RADIATION DOSE REDUCTION: This exam was performed according to the departmental dose-optimization program which includes automated exposure control, adjustment of the mA and/or kV according to patient size and/or use of iterative reconstruction technique. CONTRAST:  62m OMNIPAQUE IOHEXOL 300 MG/ML  SOLN COMPARISON:  CT pelvis 11/13/2019 FINDINGS: Lower chest: Circumflex and right coronary artery atherosclerosis with descending thoracic aortic atherosclerosis. Small type 1 hiatal hernia. Hepatobiliary: Contracted gallbladder. No  significant hepatic parenchymal lesion identified. Pancreas: Unremarkable Spleen: Numerous punctate calcifications in the spleen compatible with old granulomatous disease. Adrenals/Urinary Tract: Adrenal glands unremarkable. Atrophic right kidney with vascular calcifications along the right renal hilum. Vascular calcification along the margin of the right distal ureter but not within the right ureter. There vascular calcifications of the left renal hilum. No urinary tract calculi observed. Urinary bladder unremarkable. Stomach/Bowel: Fatty prominence of the wall of the ascending colon, this is frequently incidental but can have a weak association with inflammatory bowel disease. No findings of inflammation involving the terminal ileum. Sigmoid colon diverticulosis without findings of acute diverticulitis. Vascular/Lymphatic: Atherosclerosis is present, including aortoiliac atherosclerotic disease. Extensive atheromatous plaque in the celiac trunk along with notable atheromatous plaque narrowing the proximal SMA as well, and with atherosclerotic calcification in distal SMA branches. Neither vessel appears overtly occluded but the degree of stenosis is considerable. There is atheromatous vascular disease in the proximal IMA which does not appear overtly occluded either. Prominent atherosclerotic vascular calcification and atherosclerotic narrowing in the common iliac and external iliac arteries and extending into the superficial femoral arteries. Please note that today's exam is not a CT angiogram. No pathologic adenopathy is observed. Reproductive: Unremarkable Other: No supplemental non-categorized findings. Musculoskeletal: Left femoral nail with femoral head lag screw. Broad Schmorl's nodes along the superior endplates of TZ76and TB34 No lumbar spine fracture or subluxation. There is likely a disc bulge at L4-5 but no substantial impingement. No impinging lesion observed along the sacral plexus or proximal sciatic  nerves. Healing fractures of the left lateral seventh, eighth, ninth, and tenth ribs with healing fracture the left posterolateral eleventh rib. IMPRESSION: 1. Healing fractures the left seventh through eleventh ribs, possibly accounting for left flank pain. These appears subacute with some early callus formation. 2. 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 3. Atrophic right kidney. 4. Small type 1 hiatal hernia. 5. Sigmoid colon diverticulosis. 6. Broad Schmorl's nodes along the superior endplates of TL93and TX90 7. Disc bulge at L4-5 without substantial impingement. 8. Aortic atherosclerosis. Aortic Atherosclerosis (ICD10-I70.0). Electronically Signed   By: WCindra EvesD.  On: 10/09/2022 18:58   DG Chest Port 1 View  Result Date: 10/09/2022 CLINICAL DATA:  Weakness. EXAM: PORTABLE CHEST 1 VIEW COMPARISON:  Chest radiograph dated 05/15/2019. FINDINGS: There is diffuse chronic interstitial coarsening. No focal consolidation, pleural effusion, pneumothorax. The cardiac silhouette is within normal limits. No acute osseous pathology. IMPRESSION: No active disease. Electronically Signed   By: Anner Crete M.D.   On: 10/09/2022 17:44   Medications: I have reviewed the patient's current medications. Prior to Admission:  Medications Prior to Admission  Medication Sig Dispense Refill Last Dose   albuterol (PROVENTIL) (2.5 MG/3ML) 0.083% nebulizer solution Take 3 mLs (2.5 mg total) by nebulization every 6 (six) hours as needed for wheezing or shortness of breath. 75 mL 0 10/09/2022   alprazolam (XANAX) 2 MG tablet Take 2 mg by mouth 2 (two) times daily.     10/09/2022   atorvastatin (LIPITOR) 80 MG tablet Take 80 mg by mouth daily.   10/09/2022   busPIRone (BUSPAR) 10 MG tablet Take 10 mg by mouth 2 (two) times daily.   2 10/09/2022   carvedilol (COREG) 6.25 MG tablet Take 6.25 mg by mouth 2 (two) times daily.   10/09/2022   citalopram (CELEXA) 40 MG tablet Take 40 mg by mouth daily.   10/09/2022   DULoxetine (CYMBALTA) 60 MG capsule Take 60 mg by mouth daily.   10/09/2022   ezetimibe (ZETIA) 10 MG tablet Take 1 tablet (10 mg total) by mouth daily. 90 tablet 3 10/09/2022   furosemide (LASIX) 20 MG tablet Take 1 tablet (20 mg total) by mouth daily. 90 tablet 1 10/09/2022   gabapentin (NEURONTIN) 400 MG capsule Take 400 mg by mouth 3 (three) times daily.   10/09/2022   isosorbide mononitrate (IMDUR) 30 MG 24 hr tablet Take 1 tablet (30 mg total) by mouth daily. 30 tablet 6 10/09/2022   lisinopril (ZESTRIL) 10 MG tablet TAKE ONE TABLET BY MOUTH EVERY DAY 90 tablet 2 10/09/2022   ferrous gluconate (FERGON) 324 MG tablet Take 1 tablet (324 mg total) by mouth daily with breakfast. (Patient not taking: Reported on 10/09/2022) 30 tablet 0 Not Taking   ipratropium-albuterol (DUONEB) 0.5-2.5 (3) MG/3ML SOLN Take 3 mLs by nebulization every 6 (six) hours as needed. 360 mL 0 prn   nitroGLYCERIN (NITROSTAT) 0.4 MG SL tablet Place 0.4 mg under the tongue as needed.   prn   SYMBICORT 80-4.5 MCG/ACT inhaler Inhale 2 puffs into the lungs 2 (two) times daily. (Patient not taking: Reported on 10/09/2022)   Not Taking   Scheduled:  alprazolam  2 mg Oral BID   atorvastatin  80 mg Oral Daily   busPIRone  10 mg Oral BID   carvedilol  6.25 mg Oral BID   citalopram  40 mg Oral Daily   DULoxetine  60 mg Oral Daily   ezetimibe  10 mg Oral Daily   folic acid  1 mg Oral Daily   gabapentin  600 mg Oral TID   isosorbide mononitrate  30 mg Oral Daily   lidocaine  1 patch Transdermal Q24H   pantoprazole  40 mg Intravenous Q12H   polyethylene glycol  17 g Oral Daily   polyethylene  glycol powder  1 Container Oral Once   Continuous:  sodium chloride 100 mL/hr at 10/11/22 0638   sodium chloride 10 mL/hr at 10/10/22 1457   cefTRIAXone (ROCEPHIN)  IV 1 g (10/11/22 0528)   sodium chloride     NIO:[id number] **OR** acetaminophen, albuterol, ipratropium-albuterol, melatonin, nitroGLYCERIN,  ondansetron **OR** ondansetron (ZOFRAN) IV, oxyCODONE Anti-infectives (From admission, onward)    Start     Dose/Rate Route Frequency Ordered Stop   10/10/22 0400  cefTRIAXone (ROCEPHIN) 1 g in sodium chloride 0.9 % 100 mL IVPB        1 g 200 mL/hr over 30 Minutes Intravenous Daily 10/10/22 0345        Scheduled Meds:  alprazolam  2 mg Oral BID   atorvastatin  80 mg Oral Daily   busPIRone  10 mg Oral BID   carvedilol  6.25 mg Oral BID   citalopram  40 mg Oral Daily   DULoxetine  60 mg Oral Daily   ezetimibe  10 mg Oral Daily   folic acid  1 mg Oral Daily   gabapentin  600 mg Oral TID   isosorbide mononitrate  30 mg Oral Daily   lidocaine  1 patch Transdermal Q24H   pantoprazole  40 mg Intravenous Q12H   polyethylene glycol  17 g Oral Daily   polyethylene glycol powder  1 Container Oral Once   Continuous Infusions:  sodium chloride 100 mL/hr at 10/11/22 6256   sodium chloride 10 mL/hr at 10/10/22 1457   cefTRIAXone (ROCEPHIN)  IV 1 g (10/11/22 0528)   sodium chloride     PRN Meds:.acetaminophen **OR** acetaminophen, albuterol, ipratropium-albuterol, melatonin, nitroGLYCERIN, ondansetron **OR** ondansetron (ZOFRAN) IV, oxyCODONE   Assessment: Principal Problem:   GI bleeding Active Problems:   Hyperlipidemia LDL goal <70   AKI (acute kidney injury) ([place])   Acute lower UTI   Anxiety and depression   Anemia, blood loss  Traci Duran is a 66 y.o. female with history of COPD, chronic tobacco use, coronary artery disease, GERD, hiatal hernia, history of [place] ulcers, peripheral vascular disease, history of colonic small bowel AVMs, chronic iron deficiency  anemia presents to the emergency room with back pain, generalized weakness as well as dark stools resulting in acute on chronic iron deficiency anemia   Plan: Acute on chronic iron deficiency anemia Patient did not undergo bowel prep yesterday Recommend bowel prep today, MiraLAX prep ordered Clear liquid diet today We will proceed with upper endoscopy and colonoscopy tomorrow if patient is able to finish her bowel prep Continue Protonix 40 mg IV twice daily Recommend another dose of IV iron tomorrow N.p.o. effective 5 AM tomorrow   LOS: 2 days   Belmont Valli 10/11/2022, 12:28 PM

## 2022-10-11 NOTE — Progress Notes (Signed)
Patient reports stools still not clear.  Patient has finished her bowel prep.   Paged Dr. Marius Ditch.  Gave verbal order for another Miralax bowel prep

## 2022-10-11 NOTE — Progress Notes (Signed)
Patient going for EGD and Colonoscopy tomorrow.  Since patient started back on Clear liquids, Dr. Marius Ditch gave ok to stop IV fluids.

## 2022-10-11 NOTE — TOC Initial Note (Signed)
Transition of Care [facility]) - Initial/Assessment Note    Patient Details  Name: Traci Duran MRN: [id number] Date of Birth: 1956-11-16  Transition of Care [facility]) CM/SW Contact:    Bing Quarry, RN Phone Number: 10/11/2022, 4:21 PM  Clinical Narrative: 11/4: Spoke with patient re: HH PT/OT/HH aide recommendations and to obtain initial TOC note/assessment. Patient has not used HH before and has no preference. No preference on DME for RW and BSC/3:1. Lives with Lucila Maine who helps with transportation, no issues obtaining medications. One step into house. Uses [employer] transport as well but could not for procedure due to sedation. Grandson work part time Saturday and Monday and she feels she need extra help on those days. [facility] declined [facility]. RX and PCP listed below.  Gabriel Cirri RN CM                     McCullock,Cody (Grandson) [phone] (Mobile)  RX: [facility] [phone], [place], [place] [address] [place], [place] [address] PCP: [name] [phone]                    Expected Discharge Plan: Home w Home Health Services Barriers to Discharge: Continued Medical Work up   Patient Goals and CMS Choice     Choice offered to / list presented to : Patient  Expected Discharge Plan and Services Expected Discharge Plan: Home w Home Health Services   Discharge Planning Services: CM Consult Post Acute Care Choice: Durable Medical Equipment, Home Health Living arrangements for the past 2 months: Single Family Home                   DME Agency: [employer] (Patient had no preference)         HH Agency:  (Patient has no preference)        Prior Living Arrangements/Services Living arrangements for the past 2 months: Single Family Home Lives with:: Other (Comment) (Grandson) Patient language and need for interpreter reviewed:: No        Need for Family Participation in Patient Care: Yes (Comment) Care giver support system in place?: Yes (comment)    Criminal Activity/Legal Involvement Pertinent to Current Situation/Hospitalization: No - Comment as needed  Activities of Daily Living Home Assistive Devices/Equipment: None ADL Screening (condition at time of admission) Patient's cognitive ability adequate to safely complete daily activities?: Yes Is the patient deaf or have difficulty hearing?: No Does the patient have difficulty seeing, even when wearing glasses/contacts?: No Does the patient have difficulty concentrating, remembering, or making decisions?: No Patient able to express need for assistance with ADLs?: No Does the patient have difficulty dressing or bathing?: No Independently performs ADLs?: Yes (appropriate for developmental age) Does the patient have difficulty walking or climbing stairs?: No Weakness of Legs: None Weakness of Arms/Hands: None  Permission Sought/Granted Permission sought to share information with : Case Manager Permission granted to share information with : Yes, Verbal Permission Granted (To obtain [facility] services.)              Emotional Assessment     Affect (typically observed): Accepting Orientation: : Oriented to Self, Oriented to Place, Oriented to Situation, Oriented to  Time Alcohol / Substance Use: Not Applicable Psych Involvement: No (comment)  Admission diagnosis:  GI bleeding [K92.2] Guaiac positive stools [R19.5] Anemia, blood loss [D50.0] Symptomatic anemia [D64.9] Patient Active Problem List   Diagnosis Date Noted   AKI (acute kidney injury) (HCC)  10/10/2022   Acute lower UTI 10/10/2022   Anxiety and depression 10/10/2022   Anemia, blood loss 10/10/2022   GI bleeding 10/09/2022   Carotid stenosis 01/08/2022   Moderate to severe mitral regurgitation 09/26/2021   Iron deficiency anemia 07/31/2020   History of GI bleed 07/17/2020   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   Trochanteric fracture of right femur (HCC) 11/13/2019   Occult GI bleeding     Symptomatic anemia 10/06/2019   Chronic heart failure with preserved ejection fraction (HFpEF) (HCC) 05/16/2019   Rectal bleed 11/19/2018   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   Benzodiazepine abuse (HCC) 09/03/2018   Moderate recurrent major depression (HCC) 09/03/2018   Hyponatremia 10/11/2017   Chest pain 07/28/2017   Acute blood loss anemia 07/28/2017   Demand ischemia    Benign neoplasm of descending colon    Polyp of sigmoid colon    Age related osteoporosis 04/16/2017   Iron deficiency anemia secondary to blood loss (chronic)    GI bleed 04/06/2017   Diastolic dysfunction 09/30/2016   Opioid contract exists 06/27/2016   Incidental lung nodule 01/18/2016   Traumatic hemorrhage of cerebrum (HCC) 12/04/2015   Hx of traumatic brain injury 12/04/2015   Hypovitaminosis D 08/29/2015   Thyroid lesion 08/29/2015   Major depressive disorder, single episode, unspecified 08/27/2015   Basal ganglia hemorrhage (HCC) 08/25/2015   Gastroesophageal reflux disease 01/26/2015   Osteopenia 01/26/2015   Anemia, unspecified 12/27/2014   History of fall 12/27/2014   Stenosis of left subclavian artery (HCC) 09/26/2014   Greater tuberosity of humerus fracture 10/24/2013   AVF (arteriovenous fistula) (HCC) 11/08/2011   Anxiety 10/24/2011   Cerebrovascular disease 10/24/2011   Dyslipidemia 10/24/2011   Essential hypertension - check pressures on R arm 10/24/2011   Tobacco abuse 10/24/2011   Palpitations 10/24/2011   Renal artery stenosis (HCC) 10/24/2011   Unstable angina (HCC) 10/14/2011   Hyperlipidemia LDL goal <70    Chronic obstructive pulmonary disease (HCC)    Depression    PAD (peripheral artery disease) (HCC)    Coronary artery disease, non-occlusive    Bilateral carotid artery stenosis without cerebral infarction 10/2010   PCP:  [name]:   [facility] [place], [place] - [address]  [place] [address] Phone: [phone] Fax: [phone]  [id number] [place], [place] - [address] [place] [address] Phone: [phone] Fax: [phone]  [facility], [place] - [address] [place] [address] Phone: [phone] Fax: [phone]  [facility] [address] [place] - [address] [place] [address] Phone: [phone] Fax: [phone]  [facility] - [place], [place] - [address] [place] [address] Phone: [phone] Fax: [phone]     Social Determinants of Health (SDOH) Interventions    Readmission Risk Interventions     No data to display

## 2022-10-11 NOTE — Evaluation (Signed)
Physical Therapy Evaluation Patient Details Name: Traci Duran MRN: [id number] DOB: [birth year] Today's Date: 10/11/2022  History of Present Illness  Traci Duran is a 66 y.o female with medical history significant for COPD, coronary artery disease, GERD, hypertension, dyslipidemia and peripheral vascular disease as well as systolic CHF, presented to emergency room with acute onset of bilateral back pain mainly on the left as well as fatigue and dizziness with generalized weakness while ambulating.  Clinical Impression  Pt received in bed agreeable to PT evaluation. Pt's PLOF Independent at household and community level activity participation and lives with grandson who works full time. PT assessment reveals weakness and low BP with change of position. Supine 104/70, sitting 86/67, Standing 82/54 with dizziness thorough out the time without LOB. Pt unsafe to get OOB to chair or attempt ambulation at this point. Pt has abdominal pain which was managed with Medication. Pt scheduled for further investigation for GI bleed tomorrow and is on liquid diet today. PT will continue to monitor BP and mobility while in Acute and will benefit from HHPT and HHAide if  BP is stabilized.      Recommendations for follow up therapy are one component of a multi-disciplinary discharge planning process, led by the attending physician.  Recommendations may be updated based on patient status, additional functional criteria and insurance authorization.  Follow Up Recommendations Home health PT (HH max( PT/OT/HHAide)) Can patient physically be transported by private vehicle: No    Assistance Recommended at Discharge Intermittent Supervision/Assistance  Patient can return home with the following  A little help with walking and/or transfers;A little help with bathing/dressing/bathroom;Assistance with cooking/housework;Assistance with feeding;Assist for transportation;Help with stairs or ramp for entrance     Equipment Recommendations Rolling walker (2 wheels);BSC/3in1  Recommendations for Other Services       Functional Status Assessment Patient has had a recent decline in their functional status and demonstrates the ability to make significant improvements in function in a reasonable and predictable amount of time.     Precautions / Restrictions Precautions Precautions: Fall Restrictions Weight Bearing Restrictions: No      Mobility  Bed Mobility Overal bed mobility: Needs Assistance Bed Mobility: Rolling, Supine to Sit Rolling: Supervision   Supine to sit: Supervision          Transfers Overall transfer level: Needs assistance Equipment used: Rolling walker (2 wheels) Transfers: Sit to/from Stand Sit to Stand: Min guard           General transfer comment: deferred    Ambulation/Gait               General Gait Details: pt unsafe to attempt  Stairs: unsafe            Wheelchair Mobility: N/A    Modified Rankin (Stroke Patients Only)       Balance Overall balance assessment: Needs assistance Sitting-balance support: Feet unsupported, No upper extremity supported Sitting balance-Leahy Scale: Good Sitting balance - Comments: good but dizzy. Needs Sup   Standing balance support: Bilateral upper extremity supported Standing balance-Leahy Scale: Fair Standing balance comment: with dizziness                             Pertinent Vitals/Pain Pain Assessment Pain Assessment: No/denies pain (Pt premedicated)    Home Living Family/patient expects to be discharged to:: Private residence Living Arrangements: Other relatives Available Help at Discharge: Family;Available PRN/intermittently Type of Home: House Home Access: Stairs to  enter Entrance Stairs-Rails: None Entrance Stairs-Number of Steps: 1   Home Layout: One level        Prior Function Prior Level of Function : Independent/Modified Independent;History of Falls (last six  months)             Mobility Comments: ambulates with SPC when feeling lightheaded/ dizzy. Reporting 2 falls ADLs Comments: Per batient report Independent with ADLs. Grandson assists with grocery shopping, but patient is able to cook.     Hand Dominance   Dominant Hand: Right    Extremity/Trunk Assessment   Upper Extremity Assessment Upper Extremity Assessment: Generalized weakness    Lower Extremity Assessment Lower Extremity Assessment: Generalized weakness       Communication   Communication: No difficulties  Cognition Arousal/Alertness: Awake/alert Behavior During Therapy: WFL for tasks assessed/performed Overall Cognitive Status: Within Functional Limits for tasks assessed                                          General Comments      Exercises     Assessment/Plan    PT Assessment Patient needs continued PT services  PT Problem List Decreased strength;Decreased activity tolerance;Decreased balance;Decreased mobility;Cardiopulmonary status limiting activity;Pain       PT Treatment Interventions Gait training;Stair training;Therapeutic activities;Therapeutic exercise;Balance training;Neuromuscular re-education;Patient/family education    PT Goals (Current goals can be found in the Care Plan section)  Acute Rehab PT Goals Patient Stated Goal: " I want to go home with help." PT Goal Formulation: With patient Time For Goal Achievement: 10/25/22 Potential to Achieve Goals: Good    Frequency Min 2X/week     Co-evaluation               AM-PAC PT "6 Clicks" Mobility  Outcome Measure Help needed turning from your back to your side while in a flat bed without using bedrails?: None Help needed moving from lying on your back to sitting on the side of a flat bed without using bedrails?: A Little Help needed moving to and from a bed to a chair (including a wheelchair)?: A Little Help needed standing up from a chair using your arms (e.g.,  wheelchair or bedside chair)?: A Little Help needed to walk in hospital room?: A Lot Help needed climbing 3-5 steps with a railing? : Total 6 Click Score: 16    End of Session Equipment Utilized During Treatment: Gait belt Activity Tolerance: Treatment limited secondary to medical complications (Comment) Patient left: in bed;with call bell/phone within reach;with bed alarm set Nurse Communication: Mobility status;Precautions PT Visit Diagnosis: Unsteadiness on feet (R26.81);Muscle weakness (generalized) (M62.81);History of falling (Z91.81);Repeated falls (R29.6);Dizziness and giddiness (R42);Pain    Time: 1914-7829 PT Time Calculation (min) (ACUTE ONLY): 19 min   Charges:   PT Evaluation $PT Eval Moderate Complexity: 1 Mod PT Treatments $Therapeutic Activity: 8-22 mins       Ross Bender PT DPT 1:27 PM,10/11/22

## 2022-10-11 NOTE — Progress Notes (Signed)
PROGRESS NOTE    Traci Duran  UYQ:[id number] DOB: 10/09/1956 DOA: 10/09/2022 PCP: [name]    Brief Narrative:  66 year old female who presents to the emergency department with chief complaint of worsening back pain associated with With fatigue and dizziness and generalized weakness while ambulating.  Patient does endorse diarrhea over the last few weeks.  No bright red blood however does endorse black stools.  No history of oral iron supplementation or Pepto-Bismol use.  No excessive NSAID use.  Does endorse nausea and GERD symptoms.   Patient received 1 unit PRBC on admission for symptomatic anemia.  Follow-up hemoglobin this morning 9.5.  GI consulted on admission.  Their evaluation is pending.  Patient is markedly iron deficient.  Will receive iron sucrose 300 mg IV x1 followed by daily iron replacement.  Defer to GI regarding plans for endoscopic evaluation.  Patient also noted to have relative folate deficiency.  We will supplement with folic acid 1 mg p.o. daily.  11/4: NPO for endoscopic evaluation.  Patient states she did not receive prep, has not had BM     Assessment & Plan:   Principal Problem:   GI bleeding Active Problems:   AKI (acute kidney injury) (HCC)   Acute lower UTI   Hyperlipidemia LDL goal <70   Anxiety and depression   Anemia, blood loss  GI bleed Acute on chronic blood loss anemia Severe iron deficiency anemia Folate deficiency anemia Patient received 1 unit PRBC on admission with appropriate rise in hemoglobin.  Received 300 mg IV Venofer x1 on 11/3.  Patient was scheduled for upper and lower endoscopy 11/4 however prep was not ordered.  Prep ordered for today with plan for upper and lower endoscopy 11/5.  Will continue IV PPI.  Will order another dose of IV iron for 11/5.  Daily p.o. folic acid.    AKI Likely prerenal azotemia in the setting of GI bleed.  Creatinine improving.  Continue IVF for today.  Check hemoglobin again in  a.m.  Acute UTI Rocephin IV x3 doses  Anxiety Depression PTA Xanax, BuSpar, Celexa  Hyperlipidemia PTA statin and Zetia  DVT prophylaxis: SCD Code Status: Full Family Communication: None today Disposition Plan: Status is: Inpatient Remains inpatient appropriate because: Acute on chronic anemia.  Suspected GI bleed.  Plan for upper and lower endoscopy 11/5.  Possible discharge tomorrow or Monday.   Level of care: Progressive  Consultants:  GI  Procedures:  None  Antimicrobials: Ceftriaxone   Subjective: Seen and examined.  Resting comfortably in bed.  No visible distress.  No problems reported.  Objective: Vitals:   10/10/22 2332 10/10/22 2332 10/11/22 0425 10/11/22 0752  BP: 101/65  (!) 141/68 (!) 149/80  Pulse: 81 79 92 98  Resp: 18  18 18   Temp: 98.3 F (36.8 C)  98 F (36.7 C) 98.6 F (37 C)  TempSrc:      SpO2: (!) 89% 90% 93% 94%    Intake/Output Summary (Last 24 hours) at 10/11/2022 1149 Last data filed at 10/10/2022 1500 Gross per 24 hour  Intake 1773.44 ml  Output --  Net 1773.44 ml   There were no vitals filed for this visit.  Examination:  General exam: Appears calm and comfortable  Respiratory system: Clear to auscultation. Respiratory effort normal. Cardiovascular system: S1-S2, RRR, no murmurs, no pedal edema Gastrointestinal system: Soft, NT/ND, normal bowel sounds Central nervous system: Alert and oriented. No focal neurological deficits. Extremities: Symmetric 5 x 5 power. Skin: No rashes,  lesions or ulcers Psychiatry: Judgement and insight appear normal. Mood & affect appropriate.     Data Reviewed: I have personally reviewed following labs and imaging studies  CBC: Recent Labs  Lab 10/09/22 1711 10/09/22 2033 10/10/22 0449 10/10/22 1514  WBC 8.8  --  7.0 6.0  NEUTROABS 5.5  --   --  3.5  HGB 8.1* 8.1* 9.5* 8.5*  HCT 25.4* 25.5* 29.3* 26.8*  MCV 85.2  --  84.9 85.9  PLT 346  --  268 248   Basic Metabolic  Panel: Recent Labs  Lab 10/09/22 1711 10/10/22 0449 10/10/22 1514  NA 132* 136 135  K 3.8 3.6 4.0  CL 98 104 106  CO2 23 25 25   GLUCOSE 72 88 117*  BUN 24* 24* 20  CREATININE 1.73* 1.74* 1.68*  CALCIUM 8.9 8.8* 8.6*   GFR: CrCl cannot be calculated (Unknown ideal weight.). Liver Function Tests: Recent Labs  Lab 10/09/22 1711  AST 21  ALT 13  ALKPHOS 104  BILITOT 0.6  PROT 7.0  ALBUMIN 3.6   Recent Labs  Lab 10/09/22 1711  LIPASE 41   No results for input(s): "AMMONIA" in the last 168 hours. Coagulation Profile: No results for input(s): "INR", "PROTIME" in the last 168 hours. Cardiac Enzymes: No results for input(s): "CKTOTAL", "CKMB", "CKMBINDEX", "TROPONINI" in the last 168 hours. BNP (last 3 results) No results for input(s): "PROBNP" in the last 8760 hours. HbA1C: No results for input(s): "HGBA1C" in the last 72 hours. CBG: No results for input(s): "GLUCAP" in the last 168 hours. Lipid Profile: No results for input(s): "CHOL", "HDL", "LDLCALC", "TRIG", "CHOLHDL", "LDLDIRECT" in the last 72 hours. Thyroid Function Tests: No results for input(s): "TSH", "T4TOTAL", "FREET4", "T3FREE", "THYROIDAB" in the last 72 hours. Anemia Panel: Recent Labs    10/10/22 0844  VITAMINB12 353  FOLATE 6.0  FERRITIN 9*  TIBC 328  IRON 21*   Sepsis Labs: Recent Labs  Lab 10/10/22 1514  LATICACIDVEN 1.2    Recent Results (from the past 240 hour(s))  Urine Culture     Status: Abnormal   Collection Time: 10/09/22  7:17 PM   Specimen: Urine, Clean Catch  Result Value Ref Range Status   Specimen Description   Final    URINE, CLEAN CATCH Performed at [facility], [address], [place], [place] [address]    Special Requests   Final    NONE Performed at [facility], [address], [place], [place] [address]    Culture >=100,000 COLONIES/mL KLEBSIELLA PNEUMONIAE (A)  Final   Report Status 10/11/2022 FINAL  Final   Organism ID, Bacteria  KLEBSIELLA PNEUMONIAE (A)  Final      Susceptibility   Klebsiella pneumoniae - MIC*    AMPICILLIN RESISTANT Resistant     CEFAZOLIN <=4 SENSITIVE Sensitive     CEFEPIME <=0.12 SENSITIVE Sensitive     CEFTRIAXONE <=0.25 SENSITIVE Sensitive     CIPROFLOXACIN <=0.25 SENSITIVE Sensitive     GENTAMICIN <=1 SENSITIVE Sensitive     IMIPENEM <=0.25 SENSITIVE Sensitive     NITROFURANTOIN <=16 SENSITIVE Sensitive     TRIMETH/SULFA <=20 SENSITIVE Sensitive     AMPICILLIN/SULBACTAM 4 SENSITIVE Sensitive     PIP/TAZO <=4 SENSITIVE Sensitive     * >=100,000 COLONIES/mL KLEBSIELLA PNEUMONIAE         Radiology Studies: CT Abdomen Pelvis W Contrast  Result Date: 10/09/2022 CLINICAL DATA:  Flank pain, left back pain. EXAM: CT ABDOMEN AND PELVIS WITH CONTRAST TECHNIQUE: Multidetector  CT imaging of the abdomen and pelvis was performed using the standard protocol following bolus administration of intravenous contrast. RADIATION DOSE REDUCTION: This exam was performed according to the departmental dose-optimization program which includes automated exposure control, adjustment of the mA and/or kV according to patient size and/or use of iterative reconstruction technique. CONTRAST:  75mL OMNIPAQUE IOHEXOL 300 MG/ML  SOLN COMPARISON:  CT pelvis 11/13/2019 FINDINGS: Lower chest: Circumflex and right coronary artery atherosclerosis with descending thoracic aortic atherosclerosis. Small type 1 hiatal hernia. Hepatobiliary: Contracted gallbladder. No significant hepatic parenchymal lesion identified. Pancreas: Unremarkable Spleen: Numerous punctate calcifications in the spleen compatible with old granulomatous disease. Adrenals/Urinary Tract: Adrenal glands unremarkable. Atrophic right kidney with vascular calcifications along the right renal hilum. Vascular calcification along the margin of the right distal ureter but not within the right ureter. There vascular calcifications of the left renal hilum. No urinary tract  calculi observed. Urinary bladder unremarkable. Stomach/Bowel: Fatty prominence of the wall of the ascending colon, this is frequently incidental but can have a weak association with inflammatory bowel disease. No findings of inflammation involving the terminal ileum. Sigmoid colon diverticulosis without findings of acute diverticulitis. Vascular/Lymphatic: Atherosclerosis is present, including aortoiliac atherosclerotic disease. Extensive atheromatous plaque in the celiac trunk along with notable atheromatous plaque narrowing the proximal SMA as well, and with atherosclerotic calcification in distal SMA branches. Neither vessel appears overtly occluded but the degree of stenosis is considerable. There is atheromatous vascular disease in the proximal IMA which does not appear overtly occluded either. Prominent atherosclerotic vascular calcification and atherosclerotic narrowing in the common iliac and external iliac arteries and extending into the superficial femoral arteries. Please note that today's exam is not a CT angiogram. No pathologic adenopathy is observed. Reproductive: Unremarkable Other: No supplemental non-categorized findings. Musculoskeletal: Left femoral nail with femoral head lag screw. Broad Schmorl's nodes along the superior endplates of T11 and T12. No lumbar spine fracture or subluxation. There is likely a disc bulge at L4-5 but no substantial impingement. No impinging lesion observed along the sacral plexus or proximal sciatic nerves. Healing fractures of the left lateral seventh, eighth, ninth, and tenth ribs with healing fracture the left posterolateral eleventh rib. IMPRESSION: 1. Healing fractures the left seventh through eleventh ribs, possibly accounting for left flank pain. These appears subacute with some early callus formation. 2. Extensive atheromatous vascular disease including the coronary arteries, celiac trunk, SMA, IMA, common iliac arteries, external iliac arteries, and  superficial femoral arteries. 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 3. Atrophic right kidney. 4. Small type 1 hiatal hernia. 5. Sigmoid colon diverticulosis. 6. Broad Schmorl's nodes along the superior endplates of T11 and T12. 7. Disc bulge at L4-5 without substantial impingement. 8. Aortic atherosclerosis. Aortic Atherosclerosis (ICD10-I70.0). Electronically Signed   By: Gaylyn Rong M.D.   On: 10/09/2022 18:58   DG Chest Port 1 View  Result Date: 10/09/2022 CLINICAL DATA:  Weakness. EXAM: PORTABLE CHEST 1 VIEW COMPARISON:  Chest radiograph dated 05/15/2019. FINDINGS: There is diffuse chronic interstitial coarsening. No focal consolidation, pleural effusion, pneumothorax. The cardiac silhouette is within normal limits. No acute osseous pathology. IMPRESSION: No active disease. Electronically Signed   By: Elgie Collard M.D.   On: 10/09/2022 17:44        Scheduled Meds:  alprazolam  2 mg Oral BID   atorvastatin  80 mg Oral Daily  busPIRone  10 mg Oral BID   carvedilol  6.25 mg Oral BID   citalopram  40 mg Oral Daily   DULoxetine  60 mg Oral Daily   ezetimibe  10 mg Oral Daily   folic acid  1 mg Oral Daily   gabapentin  600 mg Oral TID   isosorbide mononitrate  30 mg Oral Daily   lidocaine  1 patch Transdermal Q24H   pantoprazole  40 mg Intravenous Q12H   polyethylene glycol  17 g Oral Daily   polyethylene glycol powder  1 Container Oral Once   Continuous Infusions:  sodium chloride 100 mL/hr at 10/11/22 2440   sodium chloride 10 mL/hr at 10/10/22 1457   cefTRIAXone (ROCEPHIN)  IV 1 g (10/11/22 0528)   sodium chloride       LOS: 2 days     Tresa Moore, MD [facility]   If 7PM-7AM, please contact night-coverage  10/11/2022, 11:49 AM

## 2022-10-11 NOTE — Progress Notes (Signed)
Explained to patient the need to call and wait for someone to get to room before getting up.  Patient stated she would not wait.  If she needs to go, she needs to go.   Explained the risk of falling due to being orthostatic.  Patient understands the risk.  Mat placed below bed.

## 2022-10-12 ENCOUNTER — Encounter
Admission: EM | Disposition: A | Discharge status: Home Health | Payer: Self-pay | Source: Home / Self Care | Attending: Internal Medicine

## 2022-10-12 ENCOUNTER — Inpatient Hospital Stay: Payer: Medicare Other | Admitting: Anesthesiology

## 2022-10-12 DIAGNOSIS — K922 Gastrointestinal hemorrhage, unspecified: Secondary | ICD-10-CM | POA: Diagnosis not present

## 2022-10-12 DIAGNOSIS — D500 Iron deficiency anemia secondary to blood loss (chronic): Secondary | ICD-10-CM | POA: Diagnosis not present

## 2022-10-12 DIAGNOSIS — D649 Anemia, unspecified: Secondary | ICD-10-CM | POA: Diagnosis not present

## 2022-10-12 HISTORY — PX: ESOPHAGOGASTRODUODENOSCOPY (EGD) WITH PROPOFOL: SHX5813

## 2022-10-12 HISTORY — PX: COLONOSCOPY WITH PROPOFOL: SHX5780

## 2022-10-12 LAB — CBC WITH DIFFERENTIAL/PLATELET
Abs Immature Granulocytes: 0.03 10*3/uL (ref 0.00–0.07)
Basophils Absolute: 0 10*3/uL (ref 0.0–0.1)
Basophils Relative: 0 %
Eosinophils Absolute: 0.1 10*3/uL (ref 0.0–0.5)
Eosinophils Relative: 1 %
HCT: 31.6 % — ABNORMAL LOW (ref 36.0–46.0)
Hemoglobin: 10 g/dL — ABNORMAL LOW (ref 12.0–15.0)
Immature Granulocytes: 0 %
Lymphocytes Relative: 16 %
Lymphs Abs: 1.5 10*3/uL (ref 0.7–4.0)
MCH: 27.2 pg (ref 26.0–34.0)
MCHC: 31.6 g/dL (ref 30.0–36.0)
MCV: 85.9 fL (ref 80.0–100.0)
Monocytes Absolute: 0.7 10*3/uL (ref 0.1–1.0)
Monocytes Relative: 7 %
Neutro Abs: 6.8 10*3/uL (ref 1.7–7.7)
Neutrophils Relative %: 76 %
Platelets: 265 10*3/uL (ref 150–400)
RBC: 3.68 MIL/uL — ABNORMAL LOW (ref 3.87–5.11)
RDW: 13.8 % (ref 11.5–15.5)
WBC: 9.2 10*3/uL (ref 4.0–10.5)
nRBC: 0 % (ref 0.0–0.2)

## 2022-10-12 LAB — BASIC METABOLIC PANEL
Anion gap: 6 (ref 5–15)
BUN: 10 mg/dL (ref 8–23)
CO2: 21 mmol/L — ABNORMAL LOW (ref 22–32)
Calcium: 9.4 mg/dL (ref 8.9–10.3)
Chloride: 112 mmol/L — ABNORMAL HIGH (ref 98–111)
Creatinine, Ser: 1.43 mg/dL — ABNORMAL HIGH (ref 0.44–1.00)
GFR, Estimated: 41 mL/min — ABNORMAL LOW (ref >60)
Glucose, Bld: 86 mg/dL (ref 70–99)
Potassium: 4.6 mmol/L (ref 3.5–5.1)
Sodium: 139 mmol/L (ref 135–145)

## 2022-10-12 LAB — TROPONIN I (HIGH SENSITIVITY): Troponin I (High Sensitivity): 125 ng/L (ref <18)

## 2022-10-12 SURGERY — ESOPHAGOGASTRODUODENOSCOPY (EGD) WITH PROPOFOL
Anesthesia: General

## 2022-10-12 MED ORDER — LIDOCAINE 2% (20 MG/ML) 5 ML SYRINGE
INTRAMUSCULAR | Status: DC | PRN
  Administered 2022-10-12: 100 mg via INTRAVENOUS

## 2022-10-12 MED ORDER — OXYCODONE HCL 5 MG PO TABS: 5.0000 mg | ORAL_TABLET | ORAL | 0 refills | Status: DC | PRN

## 2022-10-12 MED ORDER — IPRATROPIUM-ALBUTEROL 0.5-2.5 (3) MG/3ML IN SOLN
RESPIRATORY_TRACT | Status: AC
  Filled 2022-10-12: qty 3

## 2022-10-12 MED ORDER — FERROUS GLUCONATE 324 (38 FE) MG PO TABS: 324.0000 mg | ORAL_TABLET | Freq: Every day | ORAL | 1 refills | Status: DC

## 2022-10-12 MED ORDER — PROPOFOL 1000 MG/100ML IV EMUL
INTRAVENOUS | Status: AC
  Filled 2022-10-12: qty 100

## 2022-10-12 MED ORDER — PHENYLEPHRINE HCL (PRESSORS) 10 MG/ML IV SOLN
INTRAVENOUS | Status: DC | PRN
  Administered 2022-10-12 (×5): 160 ug via INTRAVENOUS

## 2022-10-12 MED ORDER — PROPOFOL 500 MG/50ML IV EMUL
INTRAVENOUS | Status: DC | PRN
  Administered 2022-10-12: 150 ug/kg/min via INTRAVENOUS

## 2022-10-12 MED ORDER — LIDOCAINE HCL (PF) 2 % IJ SOLN
INTRAMUSCULAR | Status: AC
  Filled 2022-10-12: qty 5

## 2022-10-12 MED ORDER — IPRATROPIUM-ALBUTEROL 0.5-2.5 (3) MG/3ML IN SOLN
3.0000 mL | Freq: Once | RESPIRATORY_TRACT | Status: AC
  Administered 2022-10-12: 3 mL via RESPIRATORY_TRACT

## 2022-10-12 MED ORDER — PROPOFOL 10 MG/ML IV BOLUS
INTRAVENOUS | Status: DC | PRN
  Administered 2022-10-12: 70 mg via INTRAVENOUS

## 2022-10-12 NOTE — Anesthesia Preprocedure Evaluation (Signed)
Anesthesia Evaluation  Patient identified by MRN, date of birth, ID band Patient awake    Reviewed: Allergy & Precautions, H&P , NPO status , Patient's Chart, lab work & pertinent test results, reviewed documented beta blocker date and time   Airway Mallampati: II   Neck ROM: full    Dental  (+) Poor Dentition   Pulmonary neg pulmonary ROS, COPD, Current Smoker   Pulmonary exam normal        Cardiovascular hypertension, + angina  + CAD, + Past MI, + Peripheral Vascular Disease and +CHF  negative cardio ROS Normal cardiovascular exam Rhythm:regular Rate:Normal     Neuro/Psych  PSYCHIATRIC DISORDERS Anxiety Depression    negative neurological ROS  negative psych ROS   GI/Hepatic negative GI ROS, Neg liver ROS,GERD  ,,  Endo/Other  negative endocrine ROS    Renal/GU Renal diseasenegative Renal ROS  negative genitourinary   Musculoskeletal   Abdominal   Peds  Hematology negative hematology ROS (+) Blood dyscrasia, anemia   Anesthesia Other Findings Past Medical History: 05/2020: Anemia     Comment:  Thought to be related to GI bleed. No date: Anxiety 10/2010: Bilateral carotid artery stenosis without cerebral infarction     Comment:   November 2011, CTA of the head and neck: Near complete              occlusion versus severe stenosis of nearly the entire               basilar artery, complete occlusion of the right vertebral              artery from its origin off the right SCA, moderate               noncalcified atherosclerotic in the proximal left SCA               which results in a 40% stenosis. Right ICA with               approximately 15% stenosis, left   Nov. 2012 Carotid               Duplex: >75% diameter re No date: CHF (congestive heart failure), chronic HFrEF NYHA class II,  chronic, diastolic (HCC)     Comment:  Chronic HFrEF No date: COPD (chronic obstructive pulmonary disease) ([place]) 10/2009:  Coronary artery disease, non-occlusive     Comment:  Cardiac Cath: EF 85%;  40% prox &mid RCA -> 80% RPDA and              AMrg, 60% RPAV.  40% prox & distal LAD w. 60% dLAD and               D2, - Med Rx; Myoview 9/19: Small size, Mod severity               Apical Anterior defect  c/w ISCHEMIA. -> INTERMEDIATE               RISK (med Rx because of anemia, and known distal LAD and               PDA disease. ->  Similar defect noted in August 2009) No date: Depression No date: Gastritis No date: GERD (gastroesophageal reflux disease) No date: Hemorrhoids No date: Hyperlipidemia No date: Hypertension 06/2020: Mild aortic stenosis by prior echocardiogram     Comment:  Mild aortic stenosis by echo 06/2020: Moderate to severe mitral regurgitation     Comment:  Echo at [facility]: Moderate-severe MR with mild RV               enlargement and severely elevated PA 95 mmHg. No date: Myocardial infarction [facility])     Comment:  Demand ischemia infarction initially in 2010 07/2008: PAD (peripheral artery disease) ([place])     Comment:  a) 8/'09: normal ABIs; b) post procedure Dopplers               December 2010: Suggestion of left PFA/CFA-SFV AV               fistula.; c) 02/2010: Left CFA-PFA AV fistula still               present.; d) 04/2010: CTA Abd/Pelvis- LE Runoff: Extensive              Mixed plaque in the Abd Ao.  Patent visceral As.  Bilat               2V runnoff-feet ATA & Peroneal A;  (confirmed by               Arteriogram 03/2011); e) ABIs October 2017 R ABI 0.88, L               ABI 0.79. 06/2020: Pulmonary hypertension (HCC)     Comment:  [facility] echo shows moderate-severe MR, moderate to severe               TR with RVP estimated 95 mmHg. 10/27/2010: Stenosis of left subclavian artery (HCC)     Comment:  ~75% by recent doppler -- CHECK BP ON R ARM (or               bilaterally for confirmation) Past Surgical History: No date: APPENDECTOMY 06/09/2017: COLONOSCOPY WITH PROPOFOL; N/A      Comment:  Procedure: COLONOSCOPY WITH PROPOFOL;  Surgeon: Lucilla Lame, MD;  Location: [facility] ENDOSCOPY;  Service:               Endoscopy;  Laterality: N/A; 10/08/2019: COLONOSCOPY WITH PROPOFOL; N/A     Comment:  Procedure: COLONOSCOPY WITH PROPOFOL;  Surgeon: Lucilla Lame, MD;  Location: [facility] ENDOSCOPY;  Service:               Endoscopy;  Laterality: N/A; 10/08/2019: ENTEROSCOPY; N/A     Comment:  Procedure: ENTEROSCOPY;  Surgeon: Lucilla Lame, MD;                Location: [facility] ENDOSCOPY;  Service: Endoscopy;                Laterality: N/A; 07/18/2020: ENTEROSCOPY; N/A     Comment:  Procedure: ENTEROSCOPY;  Surgeon: Lin Landsman,               MD;  Location: [facility] ENDOSCOPY;  Service:               Gastroenterology;  Laterality: N/A; 07/30/2017: ESOPHAGOGASTRODUODENOSCOPY; N/A     Comment:  Procedure: ESOPHAGOGASTRODUODENOSCOPY (EGD);  Surgeon:               Lin Landsman, MD;  Location: [facility] ENDOSCOPY;                Service: Gastroenterology;  Laterality: N/A; 04/09/2017: ESOPHAGOGASTRODUODENOSCOPY (EGD) WITH PROPOFOL; N/A     Comment:  Procedure: ESOPHAGOGASTRODUODENOSCOPY (  EGD) WITH               PROPOFOL;  Surgeon: Lucilla Lame, MD;  Location: [facility]               ENDOSCOPY;  Service: Endoscopy;  Laterality: N/A; 10/08/2019: GIVENS CAPSULE STUDY; N/A     Comment:  Procedure: GIVENS CAPSULE STUDY;  Surgeon: Lucilla Lame,              MD;  Location: [facility] ENDOSCOPY;  Service: Endoscopy;                Laterality: N/A; No date: HIP FRACTURE SURGERY 10/2009: LEFT HEART CATH AND CORONARY ANGIOGRAPHY     Comment:  [facility]: EF 85%;  40% prox &mid RCA -> 80% RPDA               and AMrg, 60% RPAV.  40% prox & distal LAD w. 60% dLAD               and D2, - Med Rx 03/28/2011: LOWER EXTREMITY ARTERIOGRAM; Bilateral     Comment:  ([facility]) RLE: moderate focal prox. R> CIA stenosis, patent              PFA, SFA, CFA, 2 vessel runoff via PT/peroneal,  AT               proximally occluded. LLE: moderate CFA stenosis, patent               PFA, SFA and PA, 2 vessel runoff via AT/peroneal, PT               occluded. 08/2018: NM GATED MYOVIEW ([facility])     Comment:  Small sized mild severity defect in the apical anterior               wall concerning for ischemia.  INTERMEDIATE RISK.               (Appears to be no change from 2009) 07/2008: NM MYOVIEW LTD     Comment:  ([facility]): A) 07/2008: Minimal reversible defect               in the apex with possible ischemia.  Likely represents               artifact.  EF 59% -> Cath: 80% PDA, 60% PAV and distal               LAD dZ); B) 08/2010: Lexiscan - > ischemia the Ant & Inf               Apex.  EF 64%.(Med Rx); C) Jan 2013: + Apical Ischemia -               unchanged; D) Jan 2016: EF 76%. No Ischemia /Infarct. 11/2009: TRANSTHORACIC ECHOCARDIOGRAM     Comment:  ([facility]) A) December 2010, 2D echo:               EF >55%, mild concentric LVH, diastolic dysfunction Gr.               I, LVIDd 4.2 cm, LVIDs 2.3 cm, mild TR (peak RVP 28               mmHg).; B) November 2011, 2D echo: EF >55%, mild               concentric LVH, diastolic dysfunction Gr. I, LVIDd 4.3  cm, LVIDs 1.7 cm, mild PR (peak RVP 25 mmHg).; C) 12/2014              Echo: EF >55%, mild LVH, DD gr. I, LAE 3.9cm, Moderate TR              (PRVP 35 mmHg) 06/13/2020: TRANSTHORACIC ECHOCARDIOGRAM     Comment:  [facility]: EF 60-65%.  Normal LV function.  Indeterminate               diastolic pressures.  Mild RV enlargement with severely               elevated PAP estimated 95. moderate biatrial dilation.                Moderate to severe MR.  Moderate to severe TR.  Mild AS.   Reproductive/Obstetrics negative OB ROS                             Anesthesia Physical Anesthesia Plan  ASA: 4 and emergent  Anesthesia Plan: General   Post-op Pain Management:     Induction:   PONV Risk Score and Plan:   Airway Management Planned:   Additional Equipment:   Intra-op Plan:   Post-operative Plan:   Informed Consent: I have reviewed the patients History and Physical, chart, labs and discussed the procedure including the risks, benefits and alternatives for the proposed anesthesia with the patient or authorized representative who has indicated his/her understanding and acceptance.     Dental Advisory Given  Plan Discussed with: CRNA  Anesthesia Plan Comments:        Anesthesia Quick Evaluation

## 2022-10-12 NOTE — Transfer of Care (Signed)
Immediate Anesthesia Transfer of Care Note  Patient: AVALEY COOP  Procedure(s) Performed: ESOPHAGOGASTRODUODENOSCOPY (EGD) WITH PROPOFOL COLONOSCOPY WITH PROPOFOL  Patient Location: PACU  Anesthesia Type:General  Level of Consciousness: drowsy  Airway & Oxygen Therapy: Patient Spontanous Breathing and Patient connected to nasal cannula oxygen  Post-op Assessment: Report given to RN and Post -op Vital signs reviewed and stable  Post vital signs: Reviewed and stable  Last Vitals:  Vitals Value Taken Time  BP 92/64 10/12/22 1212  Temp 36.7 C 10/12/22 1207  Pulse 100 10/12/22 1212  Resp 12 10/12/22 1212  SpO2 97 % 10/12/22 1212    Last Pain:  Vitals:   10/12/22 1207  TempSrc:   PainSc: Asleep      Patients Stated Pain Goal: 3 (05/09/55 1537)  Complications: No notable events documented.

## 2022-10-12 NOTE — Op Note (Signed)
[facility] Patient Name: Traci Duran Procedure Date: 10/12/2022 10:07 AM MRN: [id number] Account #: [id number] Date of Birth: 09-19-1956 Admit Type: Outpatient Age: 66 Room: [facility] ENDO ROOM 4 Gender: Female Note Status: Finalized Instrument Name: Prentice Docker [id number] Procedure:             Colonoscopy Indications:           Iron deficiency anemia secondary to chronic blood                         loss, , Iron deficiency anemia, acute on chronic IDA Providers:             Toney Reil MD, MD Medicines:             General Anesthesia Complications:         No immediate complications. Estimated blood loss: None. Procedure:             Pre-Anesthesia Assessment:                        - Prior to the procedure, a History and Physical was                         performed, and patient medications and allergies were                         reviewed. The patient is competent. The risks and                         benefits of the procedure and the sedation options and                         risks were discussed with the patient. All questions                         were answered and informed consent was obtained.                         Patient identification and proposed procedure were                         verified by the physician, the nurse, the                         anesthesiologist, the anesthetist and the technician                         in the pre-procedure area in the procedure room in the                         endoscopy suite. Mental Status Examination: alert and                         oriented. Airway Examination: normal oropharyngeal                         airway and neck mobility. Respiratory Examination:  clear to auscultation. CV Examination: normal.                         Prophylactic Antibiotics: The patient does not require                         prophylactic antibiotics. Prior Anticoagulants:  The                         patient has taken no anticoagulant or antiplatelet                         agents. ASA Grade Assessment: III - A patient with                         severe systemic disease. After reviewing the risks and                         benefits, the patient was deemed in satisfactory                         condition to undergo the procedure. The anesthesia                         plan was to use general anesthesia. Immediately prior                         to administration of medications, the patient was                         re-assessed for adequacy to receive sedatives. The                         heart rate, respiratory rate, oxygen saturations,                         blood pressure, adequacy of pulmonary ventilation, and                         response to care were monitored throughout the                         procedure. The physical status of the patient was                         re-assessed after the procedure.                        After obtaining informed consent, the colonoscope was                         passed under direct vision. Throughout the procedure,                         the patient's blood pressure, pulse, and oxygen                         saturations were monitored continuously. The  Colonoscope was introduced through the anus and                         advanced to the the cecum, identified by appendiceal                         orifice and ileocecal valve. The colonoscopy was                         performed without difficulty. The patient tolerated                         the procedure well. The quality of the bowel                         preparation was good. The ileocecal valve, appendiceal                         orifice, and rectum were photographed. Findings:      Hemorrhoids were found on perianal exam.      Two sessile polyps were found in the transverse colon. The polyps were 3       to 5 mm in  size. These polyps were removed with a cold snare. Resection       and retrieval were complete. Estimated blood loss: none.      Multiple diverticula were found in the left colon.      Non-bleeding external hemorrhoids were found during retroflexion. The       hemorrhoids were medium-sized.      The exam was otherwise without abnormality. Impression:            - Hemorrhoids found on perianal exam.                        - Two 3 to 5 mm polyps in the transverse colon,                         removed with a cold snare. Resected and retrieved.                        - Diverticulosis in the left colon.                        - Non-bleeding external hemorrhoids.                        - The examination was otherwise normal. Recommendation:        - Return patient to hospital ward for possible                         discharge same day.                        - Resume regular diet today.                        - Continue present medications.                        - Await pathology results.                        -  Return to GI clinic in 1 month. Procedure Code(s):     --- Professional ---                        [phone], Colonoscopy, flexible; with removal of                         tumor(s), polyp(s), or other lesion(s) by snare                         technique Diagnosis Code(s):     --- Professional ---                        D12.3, Benign neoplasm of transverse colon (hepatic                         flexure or splenic flexure)                        D50.0, Iron deficiency anemia secondary to blood loss                         (chronic)                        K64.4, Residual hemorrhoidal skin tags                        D50.9, Iron deficiency anemia, unspecified                        K57.30, Diverticulosis of large intestine without                         perforation or abscess without bleeding CPT copyright 2022 [employer]. All rights reserved. The codes documented in  this report are preliminary and upon coder review may  be revised to meet current compliance requirements. Dr. Libby Maw Toney Reil MD, MD 10/12/2022 12:02:54 PM This report has been signed electronically. Number of Addenda: 0 Note Initiated On: 10/12/2022 10:07 AM Total Procedure Duration: 0 hours 15 minutes 40 seconds  Estimated Blood Loss:  Estimated blood loss: none.      [facility]

## 2022-10-12 NOTE — Op Note (Signed)
[facility] Patient Name: Traci Duran Procedure Date: 10/12/2022 11:25 AM MRN: [id number] Account #: [id number] Date of Birth: 08-17-1956 Admit Type: Outpatient Age: 66 Room: [facility] ENDO ROOM 4 Gender: Female Note Status: Finalized Instrument Name: Upper Endoscope [id number] Procedure:             Upper GI endoscopy Indications:           Iron deficiency anemia secondary to chronic blood loss Providers:             Toney Reil MD, MD Medicines:             General Anesthesia Complications:         No immediate complications. Estimated blood loss: None. Procedure:             Pre-Anesthesia Assessment:                        - Prior to the procedure, a History and Physical was                         performed, and patient medications and allergies were                         reviewed. The patient is competent. The risks and                         benefits of the procedure and the sedation options and                         risks were discussed with the patient. All questions                         were answered and informed consent was obtained.                         Patient identification and proposed procedure were                         verified by the physician, the nurse, the                         anesthesiologist, the anesthetist and the technician                         in the pre-procedure area in the procedure room.                         Mental Status Examination: alert and oriented. Airway                         Examination: normal oropharyngeal airway and neck                         mobility. Respiratory Examination: clear to                         auscultation. CV Examination: normal. Prophylactic  Antibiotics: The patient does not require prophylactic                         antibiotics. Prior Anticoagulants: The patient has                         taken no anticoagulant or antiplatelet  agents. ASA                         Grade Assessment: III - A patient with severe systemic                         disease. After reviewing the risks and benefits, the                         patient was deemed in satisfactory condition to                         undergo the procedure. The anesthesia plan was to use                         general anesthesia. Immediately prior to                         administration of medications, the patient was                         re-assessed for adequacy to receive sedatives. The                         heart rate, respiratory rate, oxygen saturations,                         blood pressure, adequacy of pulmonary ventilation, and                         response to care were monitored throughout the                         procedure. The physical status of the patient was                         re-assessed after the procedure.                        After obtaining informed consent, the endoscope was                         passed under direct vision. Throughout the procedure,                         the patient's blood pressure, pulse, and oxygen                         saturations were monitored continuously. The Endoscope                         was introduced through the mouth, and advanced to the  second part of duodenum. The upper GI endoscopy was                         accomplished without difficulty. The patient tolerated                         the procedure well. Findings:      The duodenal bulb and second portion of the duodenum were normal.      The entire examined stomach was normal.      A small hiatal hernia was present.      The cardia and gastric fundus were normal on retroflexion.      Esophagogastric landmarks were identified: the gastroesophageal junction       was found at 35 cm from the incisors.      LA Grade A (one or more mucosal breaks less than 5 mm, not extending       between tops of 2  mucosal folds) esophagitis with no bleeding was found       at the gastroesophageal junction. Impression:            - Normal duodenal bulb and second portion of the                         duodenum.                        - Normal stomach.                        - Small hiatal hernia.                        - Esophagogastric landmarks identified.                        - LA Grade A reflux esophagitis with no bleeding.                        - No specimens collected. Recommendation:        - Follow an antireflux regimen for the rest of the                         patient's life.                        - Use a proton pump inhibitor PO daily indefinitely.                        - Return to GI clinic in 1 month.                        - Proceed with colonoscopy as scheduled                        See colonoscopy report Procedure Code(s):     --- Professional ---                        [phone], Esophagogastroduodenoscopy, flexible,                         transoral;  diagnostic, including collection of                         specimen(s) by brushing or washing, when performed                         (separate procedure) Diagnosis Code(s):     --- Professional ---                        K44.9, Diaphragmatic hernia without obstruction or                         gangrene                        K21.00, Gastro-esophageal reflux disease with                         esophagitis, without bleeding                        D50.0, Iron deficiency anemia secondary to blood loss                         (chronic) CPT copyright 2022 American [employer] Association. All rights reserved. The codes documented in this report are preliminary and upon coder review may  be revised to meet current compliance requirements. Dr. Libby Maw Toney Reil MD, MD 10/12/2022 11:41:58 AM This report has been signed electronically. Number of Addenda: 0 Note Initiated On: 10/12/2022 11:25 AM Estimated Blood Loss:   Estimated blood loss: none.      [facility]

## 2022-10-12 NOTE — Anesthesia Postprocedure Evaluation (Signed)
Anesthesia Post Note  Patient: Traci Duran  Procedure(s) Performed: ESOPHAGOGASTRODUODENOSCOPY (EGD) WITH PROPOFOL COLONOSCOPY WITH PROPOFOL  Anesthetic complications: no   No notable events documented.   Last Vitals:  Vitals:   10/12/22 1225 10/12/22 1230  BP: 94/67 93/71  Pulse: (!) 102 (!) 104  Resp: 10 12  Temp:  37.2 C  SpO2: 98% 99%    Last Pain:  Vitals:   10/12/22 1230  TempSrc:   PainSc: 0-No pain                 Molli Barrows

## 2022-10-12 NOTE — Discharge Summary (Signed)
Physician Discharge Summary  RATASHA FICKEN OHY:[id number] DOB: 01/31/1956 DOA: 10/09/2022  PCP: [name]  Admit date: 10/09/2022 Discharge date: 10/12/2022  Admitted From: Home Disposition:  Home with home health  Recommendations for Outpatient Follow-up:  Follow up with PCP in 1-2 weeks Follow up GI 1 month  Home Health:Yes PT OT  Equipment/Devices:None   Discharge Condition:Stable  CODE STATUS:FULL  Diet recommendation: Reg  Brief/Interim Summary:  66 year old female who presents to the emergency department with chief complaint of worsening back pain associated with With fatigue and dizziness and generalized weakness while ambulating.  Patient does endorse diarrhea over the last few weeks.  No bright red blood however does endorse black stools.  No history of oral iron supplementation or Pepto-Bismol use.  No excessive NSAID use.  Does endorse nausea and GERD symptoms.   Patient received 1 unit PRBC on admission for symptomatic anemia.  Follow-up hemoglobin this morning 9.5.  GI consulted on admission.  Their evaluation is pending.  Patient is markedly iron deficient.  Will receive iron sucrose 300 mg IV x1 followed by daily iron replacement.  Defer to GI regarding plans for endoscopic evaluation.  Patient also noted to have relative folate deficiency.  We will supplement with folic acid 1 mg p.o. daily.   11/4: NPO for endoscopic evaluation.  Patient states she did not receive prep, has not had BM 11/5: s/p EGD and colonoscopy.  No bleeding noted.  Stable for dc home.  Daily PPI prescribed.  Daily PO iron prescribed.  FU OP GI 1 month   Discharge Diagnoses:  Principal Problem:   GI bleeding Active Problems:   AKI (acute kidney injury) (HCC)   Acute lower UTI   Hyperlipidemia LDL goal <70   Anxiety and depression   Anemia, blood loss  GI bleed Acute on chronic blood loss anemia Severe iron deficiency anemia Folate deficiency anemia Patient received 1  unit PRBC on admission with appropriate rise in hemoglobin.  Received 300 mg IV Venofer x1 on 11/3.  Patient was scheduled for upper and lower endoscopy 11/4 however prep was not completed.  Prep ordered 11/4.  S/p cscope and EGD on 11/5.  No bleeding noted.  Stable for DC.  Daily PO iron and daily PPI on dc.  Followup outpatient GI 1 month.   Discharge Instructions  Discharge Instructions     Diet - low sodium heart healthy   Complete by: As directed    Increase activity slowly   Complete by: As directed       Allergies as of 10/12/2022       Reactions   Chantix [varenicline] Hives, Shortness Of Breath, Palpitations   Diphenhydramine Hcl Shortness Of Breath   Benadryl [diphenhydramine Hcl] Rash   Niacin Rash   Trazodone Palpitations        Medication List     TAKE these medications    albuterol (2.5 MG/3ML) 0.083% nebulizer solution Commonly known as: PROVENTIL Take 3 mLs (2.5 mg total) by nebulization every 6 (six) hours as needed for wheezing or shortness of breath.   alprazolam 2 MG tablet Commonly known as: XANAX Take 2 mg by mouth 2 (two) times daily.   atorvastatin 80 MG tablet Commonly known as: LIPITOR Take 80 mg by mouth daily.   busPIRone 10 MG tablet Commonly known as: BUSPAR Take 10 mg by mouth 2 (two) times daily.   carvedilol 6.25 MG tablet Commonly known as: COREG Take 6.25 mg by mouth 2 (two) times daily.  citalopram 40 MG tablet Commonly known as: CELEXA Take 40 mg by mouth daily.   DULoxetine 60 MG capsule Commonly known as: CYMBALTA Take 60 mg by mouth daily.   ezetimibe 10 MG tablet Commonly known as: ZETIA Take 1 tablet (10 mg total) by mouth daily.   ferrous gluconate 324 MG tablet Commonly known as: FERGON Take 1 tablet (324 mg total) by mouth daily with breakfast.   furosemide 20 MG tablet Commonly known as: LASIX Take 1 tablet (20 mg total) by mouth daily.   gabapentin 400 MG capsule Commonly known as: NEURONTIN Take  400 mg by mouth 3 (three) times daily.   ipratropium-albuterol 0.5-2.5 (3) MG/3ML Soln Commonly known as: DUONEB Take 3 mLs by nebulization every 6 (six) hours as needed.   isosorbide mononitrate 30 MG 24 hr tablet Commonly known as: IMDUR Take 1 tablet (30 mg total) by mouth daily.   lisinopril 10 MG tablet Commonly known as: ZESTRIL TAKE ONE TABLET BY MOUTH EVERY DAY   nitroGLYCERIN 0.4 MG SL tablet Commonly known as: NITROSTAT Place 0.4 mg under the tongue as needed.   oxyCODONE 5 MG immediate release tablet Commonly known as: Oxy IR/ROXICODONE Take 1-2 tablets (5-10 mg total) by mouth every 4 (four) hours as needed for moderate pain or severe pain.   pantoprazole 40 MG tablet Commonly known as: Protonix Take 1 tablet (40 mg total) by mouth daily. What changed:  medication strength how much to take   Symbicort 80-4.5 MCG/ACT inhaler Generic drug: budesonide-formoterol Inhale 2 puffs into the lungs 2 (two) times daily.        Allergies  Allergen Reactions   Chantix [Varenicline] Hives, Shortness Of Breath and Palpitations   Diphenhydramine Hcl Shortness Of Breath   Benadryl [Diphenhydramine Hcl] Rash   Niacin Rash   Trazodone Palpitations    Consultations: GI   Procedures/Studies: CT Abdomen Pelvis W Contrast  Result Date: 10/09/2022 CLINICAL DATA:  Flank pain, left back pain. EXAM: CT ABDOMEN AND PELVIS WITH CONTRAST TECHNIQUE: Multidetector CT imaging of the abdomen and pelvis was performed using the standard protocol following bolus administration of intravenous contrast. RADIATION DOSE REDUCTION: This exam was performed according to the departmental dose-optimization program which includes automated exposure control, adjustment of the mA and/or kV according to patient size and/or use of iterative reconstruction technique. CONTRAST:  75mL OMNIPAQUE IOHEXOL 300 MG/ML  SOLN COMPARISON:  CT pelvis 11/13/2019 FINDINGS: Lower chest: Circumflex and right coronary  artery atherosclerosis with descending thoracic aortic atherosclerosis. Small type 1 hiatal hernia. Hepatobiliary: Contracted gallbladder. No significant hepatic parenchymal lesion identified. Pancreas: Unremarkable Spleen: Numerous punctate calcifications in the spleen compatible with old granulomatous disease. Adrenals/Urinary Tract: Adrenal glands unremarkable. Atrophic right kidney with vascular calcifications along the right renal hilum. Vascular calcification along the margin of the right distal ureter but not within the right ureter. There vascular calcifications of the left renal hilum. No urinary tract calculi observed. Urinary bladder unremarkable. Stomach/Bowel: Fatty prominence of the wall of the ascending colon, this is frequently incidental but can have a weak association with inflammatory bowel disease. No findings of inflammation involving the terminal ileum. Sigmoid colon diverticulosis without findings of acute diverticulitis. Vascular/Lymphatic: Atherosclerosis is present, including aortoiliac atherosclerotic disease. Extensive atheromatous plaque in the celiac trunk along with notable atheromatous plaque narrowing the proximal SMA as well, and with atherosclerotic calcification in distal SMA branches. Neither vessel appears overtly occluded but the degree of stenosis is considerable. There is atheromatous vascular disease in the proximal IMA which  does not appear overtly occluded either. Prominent atherosclerotic vascular calcification and atherosclerotic narrowing in the common iliac and external iliac arteries and extending into the superficial femoral arteries. Please note that today's exam is not a CT angiogram. No pathologic adenopathy is observed. Reproductive: Unremarkable Other: No supplemental non-categorized findings. Musculoskeletal: Left femoral nail with femoral head lag screw. Broad Schmorl's nodes along the superior endplates of T11 and T12. No lumbar spine fracture or subluxation.  There is likely a disc bulge at L4-5 but no substantial impingement. No impinging lesion observed along the sacral plexus or proximal sciatic nerves. Healing fractures of the left lateral seventh, eighth, ninth, and tenth ribs with healing fracture the left posterolateral eleventh rib. IMPRESSION: 1. Healing fractures the left seventh through eleventh ribs, possibly accounting for left flank pain. These appears subacute with some early callus formation. 2. 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 3. Atrophic right kidney. 4. Small type 1 hiatal hernia. 5. Sigmoid colon diverticulosis. 6. Broad Schmorl's nodes along the superior endplates of T11 and T12. 7. Disc bulge at L4-5 without substantial impingement. 8. Aortic atherosclerosis. Aortic Atherosclerosis (ICD10-I70.0). Electronically Signed   By: Gaylyn Rong M.D.   On: 10/09/2022 18:58   DG Chest Port 1 View  Result Date: 10/09/2022 CLINICAL DATA:  Weakness. EXAM: PORTABLE CHEST 1 VIEW COMPARISON:  Chest radiograph dated 05/15/2019. FINDINGS: There is diffuse chronic interstitial coarsening. No focal consolidation, pleural effusion, pneumothorax. The cardiac silhouette is within normal limits. No acute osseous pathology. IMPRESSION: No active disease. Electronically Signed   By: Elgie Collard M.D.   On: 10/09/2022 17:44      Subjective: Seen and examined prior to DC.  Stable, appropriate for dc home.  Discharge Exam: Vitals:   10/12/22 1245 10/12/22 1317  BP: 113/80 (!) 109/91  Pulse: (!) 108 (!) 108  Resp: 15 16  Temp: 99 F (37.2 C) 99.1 F (37.3 C)  SpO2: 93% 91%   Vitals:   10/12/22 1225  10/12/22 1230 10/12/22 1245 10/12/22 1317  BP: 94/67 93/71 113/80 (!) 109/91  Pulse: (!) 102 (!) 104 (!) 108 (!) 108  Resp: 10 12 15 16   Temp:  99 F (37.2 C) 99 F (37.2 C) 99.1 F (37.3 C)  TempSrc:      SpO2: 98% 99% 93% 91%    General: Pt is alert, awake, not in acute distress Cardiovascular: RRR, S1/S2 +, no rubs, no gallops Respiratory: CTA bilaterally, no wheezing, no rhonchi Abdominal: Soft, NT, ND, bowel sounds + Extremities: no edema, no cyanosis    The results of significant diagnostics from this hospitalization (including imaging, microbiology, ancillary and laboratory) are listed below for reference.     Microbiology: Recent Results (from the past 240 hour(s))  Urine Culture     Status: Abnormal   Collection Time: 10/09/22  7:17 PM   Specimen: Urine, Clean Catch  Result Value Ref Range Status   Specimen Description   Final    URINE, CLEAN CATCH Performed at [facility] [address], [place], [place] [address]    Special Requests   Final    NONE Performed at [facility], [address], [place], [place] [address]    Culture >=100,000 COLONIES/mL KLEBSIELLA PNEUMONIAE (  A)  Final   Report Status 10/11/2022 FINAL  Final   Organism ID, Bacteria KLEBSIELLA PNEUMONIAE (A)  Final      Susceptibility   Klebsiella pneumoniae - MIC*    AMPICILLIN RESISTANT Resistant     CEFAZOLIN <=4 SENSITIVE Sensitive     CEFEPIME <=0.12 SENSITIVE Sensitive     CEFTRIAXONE <=0.25 SENSITIVE Sensitive     CIPROFLOXACIN <=0.25 SENSITIVE Sensitive     GENTAMICIN <=1 SENSITIVE Sensitive     IMIPENEM <=0.25 SENSITIVE Sensitive     NITROFURANTOIN <=16 SENSITIVE Sensitive     TRIMETH/SULFA <=20 SENSITIVE Sensitive     AMPICILLIN/SULBACTAM 4 SENSITIVE Sensitive     PIP/TAZO <=4 SENSITIVE Sensitive     * >=100,000 COLONIES/mL KLEBSIELLA PNEUMONIAE     Labs: BNP (last 3 results) No results for input(s): "BNP" in the last 8760 hours. Basic Metabolic  Panel: Recent Labs  Lab 10/09/22 1711 10/10/22 0449 10/10/22 1514 10/12/22 0508  NA 132* 136 135 139  K 3.8 3.6 4.0 4.6  CL 98 104 106 112*  CO2 23 25 25  21*  GLUCOSE 72 88 117* 86  BUN 24* 24* 20 10  CREATININE 1.73* 1.74* 1.68* 1.43*  CALCIUM 8.9 8.8* 8.6* 9.4   Liver Function Tests: Recent Labs  Lab 10/09/22 1711  AST 21  ALT 13  ALKPHOS 104  BILITOT 0.6  PROT 7.0  ALBUMIN 3.6   Recent Labs  Lab 10/09/22 1711  LIPASE 41   No results for input(s): "AMMONIA" in the last 168 hours. CBC: Recent Labs  Lab 10/09/22 1711 10/09/22 2033 10/10/22 0449 10/10/22 1514 10/12/22 0508  WBC 8.8  --  7.0 6.0 9.2  NEUTROABS 5.5  --   --  3.5 6.8  HGB 8.1* 8.1* 9.5* 8.5* 10.0*  HCT 25.4* 25.5* 29.3* 26.8* 31.6*  MCV 85.2  --  84.9 85.9 85.9  PLT 346  --  268 248 265   Cardiac Enzymes: No results for input(s): "CKTOTAL", "CKMB", "CKMBINDEX", "TROPONINI" in the last 168 hours. BNP: Invalid input(s): "POCBNP" CBG: No results for input(s): "GLUCAP" in the last 168 hours. D-Dimer No results for input(s): "DDIMER" in the last 72 hours. Hgb A1c No results for input(s): "HGBA1C" in the last 72 hours. Lipid Profile No results for input(s): "CHOL", "HDL", "LDLCALC", "TRIG", "CHOLHDL", "LDLDIRECT" in the last 72 hours. Thyroid function studies No results for input(s): "TSH", "T4TOTAL", "T3FREE", "THYROIDAB" in the last 72 hours.  Invalid input(s): "FREET3" Anemia work up Recent Labs    10/10/22 0844  VITAMINB12 353  FOLATE 6.0  FERRITIN 9*  TIBC 328  IRON 21*   Urinalysis    Component Value Date/Time   COLORURINE YELLOW (A) 10/09/2022 1917   APPEARANCEUR HAZY (A) 10/09/2022 1917   LABSPEC 1.003 (L) 10/09/2022 1917   PHURINE 5.0 10/09/2022 1917   GLUCOSEU NEGATIVE 10/09/2022 1917   HGBUR NEGATIVE 10/09/2022 1917   BILIRUBINUR NEGATIVE 10/09/2022 1917   KETONESUR NEGATIVE 10/09/2022 1917   PROTEINUR NEGATIVE 10/09/2022 1917   UROBILINOGEN 0.2 10/15/2011 0840    NITRITE POSITIVE (A) 10/09/2022 1917   LEUKOCYTESUR NEGATIVE 10/09/2022 1917   Sepsis Labs Recent Labs  Lab 10/09/22 1711 10/10/22 0449 10/10/22 1514 10/12/22 0508  WBC 8.8 7.0 6.0 9.2   Microbiology Recent Results (from the past 240 hour(s))  Urine Culture     Status: Abnormal   Collection Time: 10/09/22  7:17 PM   Specimen: Urine, Clean Catch  Result Value Ref Range Status   Specimen Description  Final    URINE, CLEAN CATCH Performed at [facility], [address] [place], [place] [address]    Special Requests   Final    NONE Performed at [facility], [address] [place], [place] [address]    Culture >=100,000 COLONIES/mL KLEBSIELLA PNEUMONIAE (A)  Final   Report Status 10/11/2022 FINAL  Final   Organism ID, Bacteria KLEBSIELLA PNEUMONIAE (A)  Final      Susceptibility   Klebsiella pneumoniae - MIC*    AMPICILLIN RESISTANT Resistant     CEFAZOLIN <=4 SENSITIVE Sensitive     CEFEPIME <=0.12 SENSITIVE Sensitive     CEFTRIAXONE <=0.25 SENSITIVE Sensitive     CIPROFLOXACIN <=0.25 SENSITIVE Sensitive     GENTAMICIN <=1 SENSITIVE Sensitive     IMIPENEM <=0.25 SENSITIVE Sensitive     NITROFURANTOIN <=16 SENSITIVE Sensitive     TRIMETH/SULFA <=20 SENSITIVE Sensitive     AMPICILLIN/SULBACTAM 4 SENSITIVE Sensitive     PIP/TAZO <=4 SENSITIVE Sensitive     * >=100,000 COLONIES/mL KLEBSIELLA PNEUMONIAE     Time coordinating discharge: Over 30 minutes  SIGNED:   Tresa Moore, MD  [facility] 10/12/2022, 3:54 PM Pager   If 7PM-7AM, please contact night-coverage

## 2022-10-12 NOTE — TOC Progression Note (Signed)
Transition of Care [facility]) - Progression Note    Patient Details  Name: Traci Duran MRN: [id number] Date of Birth: 02-17-1956  Transition of [facility]) CM/SW Contact  Izola Price, RN Phone Number: 10/12/2022, 12:43 PM  Clinical Narrative:  11/5: Lajean [facility] [place] per [facility] [place] has accepted for [facility] PT/OT/OT aide in place of [facility] aide. Will update provider and get order change to reflect this. Patient is in Endo this am. Simmie Davies RN CM      Expected Discharge Plan: [place] Barriers to Discharge: Continued Medical Work up  Expected Discharge Plan and Services Expected Discharge Plan: [place]   Discharge Planning Services: CM Consult Post Acute Care Choice: Durable Medical Equipment, Home Health Living arrangements for the past 2 months: Single Family Home                   DME Agency: [employer] (Patient had no preference)         [place] Agency:  (Patient has no preference)         Social Determinants of Health (SDOH) Interventions    Readmission Risk Interventions     No data to display

## 2022-10-12 NOTE — Progress Notes (Signed)
Discussed discharge instruction with patient, including medications and follow up appointments.   Encouraged patient to monitor blood pressure and heart rate at home once a day and if symptomatic.  Also encouraged patient to transition from laying to sitting and sitting to standing slowly.    Paperwork sent home with patient, including AVS and GI information paperwork.

## 2022-10-13 ENCOUNTER — Encounter: Payer: Self-pay | Admitting: Gastroenterology

## 2022-10-14 ENCOUNTER — Telehealth: Payer: Self-pay

## 2022-10-14 LAB — SURGICAL PATHOLOGY

## 2022-10-14 NOTE — Telephone Encounter (Signed)
Tried to call patient but mailbox is full  

## 2022-10-14 NOTE — Telephone Encounter (Signed)
Per Colonoscopy patient needs to return to the GI clinic in 1 month. Tried to call patient but voicemail is full

## 2022-10-15 ENCOUNTER — Encounter: Payer: Self-pay | Admitting: Gastroenterology

## 2022-10-15 NOTE — Telephone Encounter (Signed)
Patient states she does not want to schedule the appointment at this time but will give [place] a call back when she is ready to schedule

## 2022-11-24 ENCOUNTER — Other Ambulatory Visit: Payer: Self-pay | Admitting: Cardiology

## 2022-12-10 ENCOUNTER — Emergency Department: Payer: Medicare Other

## 2022-12-10 ENCOUNTER — Other Ambulatory Visit: Payer: Self-pay

## 2022-12-10 ENCOUNTER — Emergency Department
Admission: EM | Admit: 2022-12-10 | Discharge: 2022-12-10 | Disposition: A | Discharge status: Home / Self Care | Payer: Medicare Other | Attending: Emergency Medicine | Admitting: Emergency Medicine

## 2022-12-10 DIAGNOSIS — Y9389 Activity, other specified: Secondary | ICD-10-CM | POA: Diagnosis not present

## 2022-12-10 DIAGNOSIS — W19XXXA Unspecified fall, initial encounter: Secondary | ICD-10-CM

## 2022-12-10 DIAGNOSIS — S8001XA Contusion of right knee, initial encounter: Secondary | ICD-10-CM | POA: Diagnosis not present

## 2022-12-10 DIAGNOSIS — W1830XA Fall on same level, unspecified, initial encounter: Secondary | ICD-10-CM | POA: Diagnosis not present

## 2022-12-10 DIAGNOSIS — S8991XA Unspecified injury of right lower leg, initial encounter: Secondary | ICD-10-CM | POA: Diagnosis present

## 2022-12-10 LAB — URINALYSIS, ROUTINE W REFLEX MICROSCOPIC
Bilirubin Urine: NEGATIVE
Glucose, UA: NEGATIVE mg/dL
Hgb urine dipstick: NEGATIVE
Ketones, ur: NEGATIVE mg/dL
Leukocytes,Ua: NEGATIVE
Nitrite: NEGATIVE
Protein, ur: NEGATIVE mg/dL
Specific Gravity, Urine: 1.003 — ABNORMAL LOW (ref 1.005–1.030)
pH: 5 (ref 5.0–8.0)

## 2022-12-10 MED ORDER — NAPROXEN 500 MG PO TABS
500.0000 mg | ORAL_TABLET | Freq: Once | ORAL | Status: AC
  Administered 2022-12-10: 500 mg via ORAL
  Filled 2022-12-10: qty 1

## 2022-12-10 MED ORDER — NAPROXEN 500 MG PO TABS: 500.0000 mg | ORAL_TABLET | Freq: Two times a day (BID) | ORAL | 2 refills | Status: DC

## 2022-12-10 NOTE — ED Provider Triage Note (Signed)
Emergency Medicine Provider Triage Evaluation Note  Traci Duran, a 66 y.o. female  was evaluated in triage.  Pt complains of multiple complaints including a mechanical fall resulting in left knee and calf pain.  She also endorses 2 weeks of intermittent itchy lesions to her extremities and trunk.  He also notes some mild dysuria and malodorous urine.  Review of Systems  Positive: Knee pain, dysuria, insect bites Negative: FCS  Physical Exam  BP (!) 126/107 (BP Location: Left Arm)   Pulse 88   Temp 97.7 F (36.5 C) (Oral)   Resp 18   Ht '5\' 7"'$  (1.702 m)   Wt 66.4 kg   SpO2 97%   BMI 22.93 kg/m  Gen:   Awake, no distress  NAD Resp:  Normal effort CTA MSK:   Moves extremities without difficulty  Other:  Multiple, erythematous papules and scabbed lesions to extremities and trunk  Medical Decision Making  Medically screening exam initiated at 6:35 PM.  Appropriate orders placed.  LEANETTE EUTSLER was informed that the remainder of the evaluation will be completed by another provider, this initial triage assessment does not replace that evaluation, and the importance of remaining in the ED until their evaluation is complete.  Patient to the ED for multiple complaints including dysuria, insect bites, and left knee pain secondary to mechanical fall.   Melvenia Needles, PA-C 12/10/22 [phone]

## 2022-12-10 NOTE — ED Triage Notes (Signed)
Fell yesterday while cleaning out litter box.  C/o right knee/ leg pain.

## 2022-12-10 NOTE — ED Provider Notes (Signed)
[facility] Provider Note    Event Date/Time   First MD Initiated Contact with Patient 12/10/22 1948     (approximate)   History   Fall   HPI  Traci Duran is a 67 y.o. female who presents after a fall.  Patient reports she fell yesterday while cleaning the litter box, she injured her right knee.  She is able to ambulate.     Physical Exam   Triage Vital Signs: ED Triage Vitals  Enc Vitals Group     BP 12/10/22 1825 (!) 126/107     Pulse Rate 12/10/22 1825 88     Resp 12/10/22 1825 18     Temp 12/10/22 1825 97.7 F (36.5 C)     Temp Source 12/10/22 1825 Oral     SpO2 12/10/22 1825 97 %     Weight 12/10/22 1829 66.4 kg (146 lb 6.2 oz)     Height 12/10/22 1829 1.702 m ('5\' 7"'$ )     Head Circumference --      Peak Flow --      Pain Score 12/10/22 2029 4     Pain Loc --      Pain Edu? --      Excl. in [place]? --     Most recent vital signs: Vitals:   12/10/22 1825 12/10/22 2029  BP: (!) 126/107 128/88  Pulse: 88 90  Resp: 18 17  Temp: 97.7 F (36.5 C) 98 F (36.7 C)  SpO2: 97% 98%     General: Awake, no distress.  CV:  Good peripheral perfusion.  Resp:  Normal effort.  Abd:  No distention.  Other:  Right knee: Normal extension and flexion without pain, mild tenderness to palpation to the superior patella, no bony normalities.  No other injuries noted   ED Results / Procedures / Treatments   Labs (all labs ordered are listed, but only abnormal results are displayed) Labs Reviewed  URINALYSIS, ROUTINE W REFLEX MICROSCOPIC - Abnormal; Notable for the following components:      Result Value   Color, Urine STRAW (*)    APPearance CLEAR (*)    Specific Gravity, Urine 1.003 (*)    Bacteria, UA RARE (*)    All other components within normal limits     EKG     RADIOLOGY Knee x-ray viewed interpret by me, no fracture    PROCEDURES:  Critical Care performed:   Procedures   MEDICATIONS ORDERED IN ED: Medications   naproxen (NAPROSYN) tablet 500 mg (500 mg Oral Given 12/10/22 2029)     IMPRESSION / MDM / ASSESSMENT AND PLAN / ED COURSE  I reviewed the triage vital signs and the nursing notes. Patient's presentation is most consistent with acute complicated illness / injury requiring diagnostic workup.  Patient presents for fall with knee injury, x-ray is negative for fracture but does show osteopenia, discussed with patient the need to follow-up with PCP for treatment of osteopenia.  No further workup necessary here, analgesics provided.        FINAL CLINICAL IMPRESSION(S) / ED DIAGNOSES   Final diagnoses:  Fall, initial encounter  Contusion of right knee, initial encounter     Rx / DC Orders   ED Discharge Orders          Ordered    naproxen (NAPROSYN) 500 MG tablet  2 times daily with meals        12/10/22 2020  Note:  This document was prepared using Dragon voice recognition software and may include unintentional dictation errors.   Lavonia Drafts, MD 12/10/22 2248

## 2022-12-10 NOTE — ED Notes (Signed)
Light blue, light green and lavendar sent to lab

## 2022-12-24 ENCOUNTER — Encounter: Payer: Self-pay | Admitting: Oncology

## 2022-12-30 ENCOUNTER — Inpatient Hospital Stay: Payer: 59 | Attending: Nurse Practitioner | Admitting: Nurse Practitioner

## 2022-12-30 ENCOUNTER — Other Ambulatory Visit: Payer: Self-pay

## 2022-12-30 ENCOUNTER — Encounter: Payer: Self-pay | Admitting: Oncology

## 2022-12-30 ENCOUNTER — Encounter: Payer: Self-pay | Admitting: Nurse Practitioner

## 2022-12-30 ENCOUNTER — Inpatient Hospital Stay: Payer: 59

## 2022-12-30 VITALS — BP 104/66 | HR 60 | Temp 97.2°F | Wt 146.0 lb

## 2022-12-30 DIAGNOSIS — E538 Deficiency of other specified B group vitamins: Secondary | ICD-10-CM

## 2022-12-30 DIAGNOSIS — D500 Iron deficiency anemia secondary to blood loss (chronic): Secondary | ICD-10-CM | POA: Diagnosis present

## 2022-12-30 DIAGNOSIS — Z79899 Other long term (current) drug therapy: Secondary | ICD-10-CM | POA: Diagnosis not present

## 2022-12-30 LAB — CBC WITH DIFFERENTIAL/PLATELET
Abs Immature Granulocytes: 0.02 10*3/uL (ref 0.00–0.07)
Basophils Absolute: 0.1 10*3/uL (ref 0.0–0.1)
Basophils Relative: 1 %
Eosinophils Absolute: 0.2 10*3/uL (ref 0.0–0.5)
Eosinophils Relative: 2 %
HCT: 30 % — ABNORMAL LOW (ref 36.0–46.0)
Hemoglobin: 10.1 g/dL — ABNORMAL LOW (ref 12.0–15.0)
Immature Granulocytes: 0 %
Lymphocytes Relative: 23 %
Lymphs Abs: 1.5 10*3/uL (ref 0.7–4.0)
MCH: 30 pg (ref 26.0–34.0)
MCHC: 33.7 g/dL (ref 30.0–36.0)
MCV: 89 fL (ref 80.0–100.0)
Monocytes Absolute: 0.6 10*3/uL (ref 0.1–1.0)
Monocytes Relative: 9 %
Neutro Abs: 4.3 10*3/uL (ref 1.7–7.7)
Neutrophils Relative %: 65 %
Platelets: 248 10*3/uL (ref 150–400)
RBC: 3.37 MIL/uL — ABNORMAL LOW (ref 3.87–5.11)
RDW: 15.3 % (ref 11.5–15.5)
WBC: 6.6 10*3/uL (ref 4.0–10.5)
nRBC: 0 % (ref 0.0–0.2)

## 2022-12-30 LAB — IRON AND TIBC
Iron: 51 ug/dL (ref 28–170)
Saturation Ratios: 15 % (ref 10.4–31.8)
TIBC: 337 ug/dL (ref 250–450)
UIBC: 286 ug/dL

## 2022-12-30 LAB — FERRITIN: Ferritin: 20 ng/mL (ref 11–307)

## 2022-12-30 NOTE — Progress Notes (Signed)
Hematology/Oncology Consult Note [facility]  Telephone:([phone] [phone]  Patient Care Team: [name] as PCP - General   Name of the patient: Traci Duran  [id number]  Jul 09, 1956   Date of visit: 12/30/22  Diagnosis-and B12 deficiency anemia  Chief complaint/ Reason for visit- routine follow up for iron and b12 deficiency  Heme/Onc history: Patient is a 67 year old female referred for iron deficiency anemia.  Most recent CBC from 07/16/2020 showed white count of 7.9, H&H of 7.2/25.6 with an MCV of 74.9 and a platelet count of 280.  Ferritin levels were low at 19 and iron studies showed low iron saturation of 4%.  B12 was low at 187.  Folate was normal.  She also underwent small bowel endoscopy for obscure GI bleeding which showed no evidence of any pathology in proximal jejunum medium sized hiatal hernia she has previously undergone EGD and colonoscopy in October and November 2020 as well she is here for consideration of IV iron.  Patient has tolerated IV iron well in the past  Interval history- Traci Duran is a 67 y.o. female who returns to clinic for routine follow up and discussion of lab results. She was last seen by Dr. Smith Robert in March 2023. She has history of colonic small bowel avms, hiatal hernia, who presented to ER for back pain, weakness, and dark stools on 10/10/22 and diagnosed with GI bleed. She underwent EGD and colonoscopy while admitted. During hospitalization she received 1 unit pRBCs and iron sucrose 300 mg x 1. No bleeding was noted on EGD and colonoscopy. She was discharged to home with PPI and daily iron. She has not followed up with GI outpatient.  Today, patient reports ongoing intermittent GI bleeds which is a chronic problem. Feels that she is generally weak and needs infusion.   ECOG PS- 1 Pain scale- 5   Review of systems- Review of Systems  Constitutional:  Positive for malaise/fatigue. Negative for  chills, fever and weight loss.  HENT:  Negative for congestion, ear discharge and nosebleeds.   Eyes:  Negative for blurred vision.  Respiratory:  Negative for cough, hemoptysis, sputum production, shortness of breath and wheezing.   Cardiovascular:  Negative for chest pain, palpitations, orthopnea and claudication.  Gastrointestinal:  Negative for abdominal pain, blood in stool, constipation, diarrhea, heartburn, melena, nausea and vomiting.  Genitourinary:  Negative for dysuria, flank pain, frequency, hematuria and urgency.  Musculoskeletal:  Positive for joint pain. Negative for back pain and myalgias.  Skin:  Negative for rash.  Neurological:  Negative for dizziness, tingling, focal weakness, seizures, weakness and headaches.  Endo/Heme/Allergies:  Does not bruise/bleed easily.  Psychiatric/Behavioral:  Negative for depression and suicidal ideas. The patient does not have insomnia.      Allergies  Allergen Reactions   Chantix [Varenicline] Hives, Shortness Of Breath and Palpitations   Diphenhydramine Hcl Shortness Of Breath   Benadryl [Diphenhydramine Hcl] Rash   Niacin Rash   Trazodone Palpitations     Past Medical History:  Diagnosis Date   Anemia 05/2020   Thought to be related to GI bleed.   Anxiety    Bilateral carotid artery stenosis without cerebral infarction 10/2010    November 2011, CTA of the head and neck: Near complete occlusion versus severe stenosis of nearly the entire basilar artery, complete occlusion of the right vertebral artery from its origin off the right SCA, moderate noncalcified atherosclerotic in the proximal left SCA which results in a 40% stenosis. Right  ICA with approximately 15% stenosis, left   Nov. 2012 Carotid Duplex: >75% diameter re   CHF (congestive heart failure), chronic HFrEF NYHA class II, chronic, diastolic (HCC)    Chronic HFrEF   COPD (chronic obstructive pulmonary disease) (HCC)    Coronary artery disease, non-occlusive 10/2009    Cardiac Cath: EF 85%;  40% prox &mid RCA -> 80% RPDA and AMrg, 60% RPAV.  40% prox & distal LAD w. 60% dLAD and D2, - Med Rx; Myoview 9/19: Small size, Mod severity Apical Anterior defect  c/w ISCHEMIA. -> INTERMEDIATE RISK (med Rx because of anemia, and known distal LAD and PDA disease. ->  Similar defect noted in August 2009)   Depression    Gastritis    GERD (gastroesophageal reflux disease)    Hemorrhoids    Hyperlipidemia    Hypertension    Mild aortic stenosis by prior echocardiogram 06/2020   Mild aortic stenosis by echo   Moderate to severe mitral regurgitation 06/2020   Echo at [facility]: Moderate-severe MR with mild RV enlargement and severely elevated PA 95 mmHg.   Myocardial infarction [facility])    Demand ischemia infarction initially in 2010   PAD (peripheral artery disease) (HCC) 07/2008   a) 8/'09: normal ABIs; b) post procedure Dopplers December 2010: Suggestion of left PFA/CFA-SFV AV fistula.; c) 02/2010: Left CFA-PFA AV fistula still present.; d) 04/2010: CTA Abd/Pelvis- LE Runoff: Extensive Mixed plaque in the Abd Ao.  Patent visceral As.  Bilat 2V runnoff-feet ATA & Peroneal A;  (confirmed by Arteriogram 03/2011); e) ABIs October 2017 R ABI 0.88, L ABI 0.79.   Pulmonary hypertension (HCC) 06/2020   [facility] echo shows moderate-severe MR, moderate to severe TR with RVP estimated 95 mmHg.   Stenosis of left subclavian artery (HCC) 10/27/2010   ~75% by recent doppler -- CHECK BP ON R ARM (or bilaterally for confirmation)     Past Surgical History:  Procedure Laterality Date   APPENDECTOMY     COLONOSCOPY WITH PROPOFOL N/A 06/09/2017   Procedure: COLONOSCOPY WITH PROPOFOL;  Surgeon: Midge Minium, MD;  Location: [facility] ENDOSCOPY;  Service: Endoscopy;  Laterality: N/A;   COLONOSCOPY WITH PROPOFOL N/A 10/08/2019   Procedure: COLONOSCOPY WITH PROPOFOL;  Surgeon: Midge Minium, MD;  Location: [facility] ENDOSCOPY;  Service: Endoscopy;  Laterality: N/A;   COLONOSCOPY WITH PROPOFOL N/A 10/12/2022    Procedure: COLONOSCOPY WITH PROPOFOL;  Surgeon: Toney Reil, MD;  Location: [facility] ENDOSCOPY;  Service: Gastroenterology;  Laterality: N/A;   ENTEROSCOPY N/A 10/08/2019   Procedure: ENTEROSCOPY;  Surgeon: Midge Minium, MD;  Location: [facility] ENDOSCOPY;  Service: Endoscopy;  Laterality: N/A;   ENTEROSCOPY N/A 07/18/2020   Procedure: ENTEROSCOPY;  Surgeon: Toney Reil, MD;  Location: [facility] ENDOSCOPY;  Service: Gastroenterology;  Laterality: N/A;   ESOPHAGOGASTRODUODENOSCOPY N/A 07/30/2017   Procedure: ESOPHAGOGASTRODUODENOSCOPY (EGD);  Surgeon: Toney Reil, MD;  Location: [facility] ENDOSCOPY;  Service: Gastroenterology;  Laterality: N/A;   ESOPHAGOGASTRODUODENOSCOPY (EGD) WITH PROPOFOL N/A 04/09/2017   Procedure: ESOPHAGOGASTRODUODENOSCOPY (EGD) WITH PROPOFOL;  Surgeon: Midge Minium, MD;  Location: [facility] ENDOSCOPY;  Service: Endoscopy;  Laterality: N/A;   ESOPHAGOGASTRODUODENOSCOPY (EGD) WITH PROPOFOL N/A 10/12/2022   Procedure: ESOPHAGOGASTRODUODENOSCOPY (EGD) WITH PROPOFOL;  Surgeon: Toney Reil, MD;  Location: [facility] ENDOSCOPY;  Service: Gastroenterology;  Laterality: N/A;   GIVENS CAPSULE STUDY N/A 10/08/2019   Procedure: GIVENS CAPSULE STUDY;  Surgeon: Midge Minium, MD;  Location: [facility] ENDOSCOPY;  Service: Endoscopy;  Laterality: N/A;   HIP FRACTURE SURGERY     LEFT HEART CATH AND  CORONARY ANGIOGRAPHY  November 18, 2009   [facility]: EF 85%;  40% prox &mid RCA -> 80% RPDA and AMrg, 60% RPAV.  40% prox & distal LAD w. 60% dLAD and D2, - Med Rx   LOWER EXTREMITY ARTERIOGRAM Bilateral Apr 19, 2011   ([facility]) RLE: moderate focal prox. R> CIA stenosis, patent PFA, SFA, CFA, 2 vessel runoff via PT/peroneal, AT proximally occluded. LLE: moderate CFA stenosis, patent PFA, SFA and PA, 2 vessel runoff via AT/peroneal, PT occluded.   NM GATED MYOVIEW ([facility] HX)  08/2018   Small sized mild severity defect in the apical anterior wall concerning for ischemia.  INTERMEDIATE RISK. (Appears to be no  change from 18-Nov-2008)   NM MYOVIEW LTD  07/2008   ([facility]): A) 07/2008: Minimal reversible defect in the apex with possible ischemia.  Likely represents artifact.  EF 59% -> Cath: 80% PDA, 60% PAV and distal LAD dZ); B) 08/2010: Lexiscan - > ischemia the Ant & Inf Apex.  EF 64%.(Med Rx); C) Jan 2013: + Apical Ischemia - unchanged; D) Jan 2016: EF 76%. No Ischemia /Infarct.   TRANSTHORACIC ECHOCARDIOGRAM  11/2009   [facility]) A) December 2010, 2D echo: EF >55%, mild concentric LVH, diastolic dysfunction Gr. I, LVIDd 4.2 cm, LVIDs 2.3 cm, mild TR (peak RVP 28 mmHg).; B) Dec 12, 20112D echo: EF >55%, mild concentric LVH, diastolic dysfunction Gr. I, LVIDd 4.3 cm, LVIDs 1.7 cm, mild PR (peak RVP 25 mmHg).; C) 12/2014 Echo: EF >55%, mild LVH, DD gr. I, LAE 3.9cm, Moderate TR (PRVP 35 mmHg)   TRANSTHORACIC ECHOCARDIOGRAM  06/13/2020   [facility]: EF 60-65%.  Normal LV function.  Indeterminate diastolic pressures.  Mild RV enlargement with severely elevated PAP estimated 95. moderate biatrial dilation.  Moderate to severe MR.  Moderate to severe TR.  Mild AS.    Social History   Socioeconomic History   Marital status: Widowed    Spouse name: Not on file   Number of children: 3   Years of education: Not on file   Highest education level: Not on file  Occupational History   Not on file  Tobacco Use   Smoking status: Every Day    Packs/day: 1.00    Years: 46.00    Total pack years: 46.00    Types: Cigarettes   Smokeless tobacco: Never   Tobacco comments:    0.5PPD 02/17/2022  Vaping Use   Vaping Use: Never used  Substance and Sexual Activity   Alcohol use: No   Drug use: Yes    Types: Marijuana    Comment: monthly   Sexual activity: Not Currently  Other Topics Concern   Not on file  Social History Narrative   She is a widowed mother of at least 3 daughters.  Unfortunately all 3 daughters have deceased.        2015: Her youngest daughter and son-in-law were run over by  an [place] train   18-Nov-2017: Middle daughter died of complications of DKA   04/18/2021: Oldest daughter died cancer after being on palliative care.   She also had a dog that died after being with her for 18 years.   Social Determinants of Health   Financial Resource Strain: Low Risk  (10/11/2017)   Overall Financial Resource Strain (CARDIA)    Difficulty of Paying Living Expenses: Not hard at all  Food Insecurity: No Food Insecurity (10/10/2022)   Hunger Vital Sign    Worried About Running Out of Food in the Last Year: Never true  Ran Out of Food in the Last Year: Never true  Transportation Needs: No Transportation Needs (10/10/2022)   PRAPARE - Administrator, Civil Service (Medical): No    Lack of Transportation (Non-Medical): No  Physical Activity: Inactive (10/11/2017)   Exercise Vital Sign    Days of Exercise per Week: 0 days    Minutes of Exercise per Session: 0 min  Stress: Stress Concern Present (10/11/2017)   [employer] of Occupational Health - Occupational Stress Questionnaire    Feeling of Stress : Very much  Social Connections: Unknown (10/11/2017)   Social Connection and Isolation Panel [NHANES]    Frequency of Communication with Friends and Family: Patient refused    Frequency of Social Gatherings with Friends and Family: Patient refused    Attends Religious Services: Patient refused    Active Member of Clubs or Organizations: Patient refused    Attends Banker Meetings: Patient refused    Marital Status: Patient refused  Intimate Partner Violence: Not At Risk (10/10/2022)   Humiliation, Afraid, Rape, and Kick questionnaire    Fear of Current or Ex-Partner: No    Emotionally Abused: No    Physically Abused: No    Sexually Abused: No    Family History  Problem Relation Age of Onset   Heart failure Mother    Heart attack Mother        mother died at 62 of an MI   Heart attack Father        father died at 76 of an MI   Heart attack  Brother        Brother had an MI in his 16s     Current Outpatient Medications:    albuterol (PROVENTIL) (2.5 MG/3ML) 0.083% nebulizer solution, Take 3 mLs (2.5 mg total) by nebulization every 6 (six) hours as needed for wheezing or shortness of breath., Disp: 75 mL, Rfl: 0   alprazolam (XANAX) 2 MG tablet, Take 2 mg by mouth 2 (two) times daily. , Disp: , Rfl:    atorvastatin (LIPITOR) 80 MG tablet, Take 80 mg by mouth daily., Disp: , Rfl:    busPIRone (BUSPAR) 10 MG tablet, Take 10 mg by mouth 2 (two) times daily. , Disp: , Rfl: 2   carvedilol (COREG) 6.25 MG tablet, Take 6.25 mg by mouth 2 (two) times daily., Disp: , Rfl:    citalopram (CELEXA) 40 MG tablet, Take 40 mg by mouth daily., Disp: , Rfl:    DULoxetine (CYMBALTA) 60 MG capsule, Take 60 mg by mouth daily., Disp: , Rfl:    ezetimibe (ZETIA) 10 MG tablet, Take 1 tablet (10 mg total) by mouth daily., Disp: 90 tablet, Rfl: 3   ferrous gluconate (FERGON) 324 MG tablet, Take 1 tablet (324 mg total) by mouth daily with breakfast., Disp: 30 tablet, Rfl: 1   furosemide (LASIX) 20 MG tablet, Take 1 tablet (20 mg total) by mouth daily., Disp: 90 tablet, Rfl: 1   gabapentin (NEURONTIN) 400 MG capsule, Take 400 mg by mouth 3 (three) times daily., Disp: , Rfl:    ipratropium-albuterol (DUONEB) 0.5-2.5 (3) MG/3ML SOLN, Take 3 mLs by nebulization every 6 (six) hours as needed., Disp: 360 mL, Rfl: 0   isosorbide mononitrate (IMDUR) 30 MG 24 hr tablet, Take 1 tablet (30 mg total) by mouth daily., Disp: 30 tablet, Rfl: 6   lisinopril (ZESTRIL) 10 MG tablet, TAKE ONE TABLET BY MOUTH EVERY DAY, Disp: 90 tablet, Rfl: 3   naproxen (NAPROSYN) 500 MG  tablet, Take 1 tablet (500 mg total) by mouth 2 (two) times daily with a meal., Disp: 20 tablet, Rfl: 2   nitroGLYCERIN (NITROSTAT) 0.4 MG SL tablet, Place 0.4 mg under the tongue as needed., Disp: , Rfl:    oxyCODONE (OXY IR/ROXICODONE) 5 MG immediate release tablet, Take 1-2 tablets (5-10 mg total) by mouth  every 4 (four) hours as needed for moderate pain or severe pain., Disp: 15 tablet, Rfl: 0   pantoprazole (PROTONIX) 40 MG tablet, Take 1 tablet (40 mg total) by mouth daily., Disp: 30 tablet, Rfl: 1   SYMBICORT 80-4.5 MCG/ACT inhaler, Inhale 2 puffs into the lungs 2 (two) times daily., Disp: , Rfl:  No current facility-administered medications for this visit.  Facility-Administered Medications Ordered in Other Visits:    alteplase (CATHFLO ACTIVASE) injection 2 mg, 2 mg, Intracatheter, Once PRN, Creig Hines, MD   heparin lock flush 100 unit/mL, 500 Units, Intracatheter, Once PRN, Creig Hines, MD   heparin lock flush 100 unit/mL, 250 Units, Intracatheter, Once PRN, Creig Hines, MD   sodium chloride flush (NS) 0.9 % injection 10 mL, 10 mL, Intracatheter, Once PRN, Creig Hines, MD   sodium chloride flush (NS) 0.9 % injection 3 mL, 3 mL, Intracatheter, Once PRN, Creig Hines, MD  Physical exam:  Vitals:   12/30/22 1342  BP: 104/66  Pulse: 60  Temp: (!) 97.2 F (36.2 C)  TempSrc: Tympanic  Weight: 146 lb (66.2 kg)   Physical Exam Constitutional:      General: She is not in acute distress. Cardiovascular:     Rate and Rhythm: Normal rate and regular rhythm.     Heart sounds: Normal heart sounds.  Pulmonary:     Effort: Pulmonary effort is normal.     Breath sounds: Normal breath sounds.  Skin:    General: Skin is warm and dry.  Neurological:     Mental Status: She is alert and oriented to person, place, and time.         Latest Ref Rng & Units 10/12/2022    5:08 AM  CMP  Glucose 70 - 99 mg/dL 86   BUN 8 - 23 mg/dL 10   Creatinine 1.61 - 1.00 mg/dL 0.96   Sodium 045 - 409 mmol/L 139   Potassium 3.5 - 5.1 mmol/L 4.6   Chloride 98 - 111 mmol/L 112   CO2 22 - 32 mmol/L 21   Calcium 8.9 - 10.3 mg/dL 9.4       Latest Ref Rng & Units 12/30/2022    1:26 PM  CBC  WBC 4.0 - 10.5 K/uL 6.6   Hemoglobin 12.0 - 15.0 g/dL 81.1   Hematocrit 91.4 - 46.0 % 30.0    Platelets 150 - 400 K/uL 248    Iron/TIBC/Ferritin/ %Sat    Component Value Date/Time   IRON 51 12/30/2022 1326   TIBC 337 12/30/2022 1326   FERRITIN 20 12/30/2022 1326   IRONPCTSAT 15 12/30/2022 1326     Assessment and plan- Patient is a 67 y.o. female   Iron Deficiency anemia- d/t chronic gi bleeds and hiatal hernia. Ferritin 30 with pcp on 12/19/22. Hmg improved to 10.1. Ferritin now 20, iron sat 15%. She is symptomatic. Plan for venofer 200 mg x 3.  B12 deficiency- chronic hx. Unable to add on b12 level to labs today. Will check at next visit. Not on b12 supplement or injections but may need to consider.  Blood loss- I have encourage dpatient to  see GI outpatient. She has not followed up as recommended and we discussed that she needs to see GI to determine underlying cause of her ongoing and recurrent blood losses. She agrees and will call to schedule appointment.   Disposition: Venofer x 3  Rtc in 3 mo for labs (cbc, cmp, ferritin, iron studies, b12) Few days to week later see myself or Dr Smith Robert, +/- venofer & b12- la  Visit Diagnosis 1. Iron deficiency anemia due to chronic blood loss   2. B12 deficiency    Consuello Masse, DNP, AGNP-C, [facility] [phone] (clinic) 12/30/2022

## 2023-01-06 ENCOUNTER — Inpatient Hospital Stay: Payer: 59

## 2023-01-06 VITALS — BP 134/59 | HR 66 | Temp 97.5°F | Resp 16

## 2023-01-06 DIAGNOSIS — D500 Iron deficiency anemia secondary to blood loss (chronic): Secondary | ICD-10-CM | POA: Diagnosis not present

## 2023-01-06 MED ORDER — SODIUM CHLORIDE 0.9 % IV SOLN
Freq: Once | INTRAVENOUS | Status: AC
  Filled 2023-01-06: qty 250

## 2023-01-06 MED ORDER — SODIUM CHLORIDE 0.9 % IV SOLN
300.0000 mg | INTRAVENOUS | Status: DC
  Administered 2023-01-06: 300 mg via INTRAVENOUS
  Filled 2023-01-06: qty 200

## 2023-01-06 NOTE — Patient Instructions (Signed)
Iron Sucrose Injection What is this medication? IRON SUCROSE (EYE ern SOO krose) treats low levels of iron (iron deficiency anemia) in people with kidney disease. Iron is a mineral that plays an important role in making red blood cells, which carry oxygen from your lungs to the rest of your body. This medicine may be used for other purposes; ask your health care provider or pharmacist if you have questions. COMMON BRAND NAME(S): Venofer What should I tell my care team before I take this medication? They need to know if you have any of these conditions: Anemia not caused by low iron levels Heart disease High levels of iron in the blood Kidney disease Liver disease An unusual or allergic reaction to iron, other medications, foods, dyes, or preservatives Pregnant or trying to get pregnant Breastfeeding How should I use this medication? This medication is for infusion into a vein. It is given in a hospital or clinic setting. Talk to your care team about the use of this medication in children. While this medication may be prescribed for children as young as 2 years for selected conditions, precautions do apply. Overdosage: If you think you have taken too much of this medicine contact a poison control center or emergency room at once. NOTE: This medicine is only for you. Do not share this medicine with others. What if I miss a dose? Keep appointments for follow-up doses. It is important not to miss your dose. Call your care team if you are unable to keep an appointment. What may interact with this medication? Do not take this medication with any of the following: Deferoxamine Dimercaprol Other iron products This medication may also interact with the following: Chloramphenicol Deferasirox This list may not describe all possible interactions. Give your health care provider a list of all the medicines, herbs, non-prescription drugs, or dietary supplements you use. Also tell them if you smoke,  drink alcohol, or use illegal drugs. Some items may interact with your medicine. What should I watch for while using this medication? Visit your care team regularly. Tell your care team if your symptoms do not start to get better or if they get worse. You may need blood work done while you are taking this medication. You may need to follow a special diet. Talk to your care team. Foods that contain iron include: whole grains/cereals, dried fruits, beans, or peas, leafy green vegetables, and organ meats (liver, kidney). What side effects may I notice from receiving this medication? Side effects that you should report to your care team as soon as possible: Allergic reactions--skin rash, itching, hives, swelling of the face, lips, tongue, or throat Low blood pressure--dizziness, feeling faint or lightheaded, blurry vision Shortness of breath Side effects that usually do not require medical attention (report to your care team if they continue or are bothersome): Flushing Headache Joint pain Muscle pain Nausea Pain, redness, or irritation at injection site This list may not describe all possible side effects. Call your doctor for medical advice about side effects. You may report side effects to FDA at 1-800-FDA-1088. Where should I keep my medication? This medication is given in a hospital or clinic and will not be stored at home. NOTE: This sheet is a summary. It may not cover all possible information. If you have questions about this medicine, talk to your doctor, pharmacist, or health care provider.  2023 Elsevier/Gold Standard (2021-03-07 00:00:00)

## 2023-01-12 ENCOUNTER — Ambulatory Visit: Payer: 59

## 2023-01-13 ENCOUNTER — Inpatient Hospital Stay: Payer: 59 | Attending: Nurse Practitioner

## 2023-01-15 ENCOUNTER — Inpatient Hospital Stay: Payer: 59

## 2023-01-15 ENCOUNTER — Ambulatory Visit: Payer: 59

## 2023-02-14 ENCOUNTER — Encounter: Payer: Self-pay | Admitting: Pulmonary Disease

## 2023-03-23 ENCOUNTER — Other Ambulatory Visit: Payer: Self-pay | Admitting: *Deleted

## 2023-03-23 DIAGNOSIS — D500 Iron deficiency anemia secondary to blood loss (chronic): Secondary | ICD-10-CM

## 2023-03-23 DIAGNOSIS — E538 Deficiency of other specified B group vitamins: Secondary | ICD-10-CM

## 2023-03-24 ENCOUNTER — Inpatient Hospital Stay: Payer: 59 | Attending: Nurse Practitioner

## 2023-03-24 DIAGNOSIS — Z79899 Other long term (current) drug therapy: Secondary | ICD-10-CM | POA: Insufficient documentation

## 2023-03-24 DIAGNOSIS — E538 Deficiency of other specified B group vitamins: Secondary | ICD-10-CM | POA: Insufficient documentation

## 2023-03-24 DIAGNOSIS — D500 Iron deficiency anemia secondary to blood loss (chronic): Secondary | ICD-10-CM | POA: Insufficient documentation

## 2023-03-26 ENCOUNTER — Other Ambulatory Visit: Payer: Self-pay

## 2023-03-26 ENCOUNTER — Emergency Department: Payer: 59

## 2023-03-26 ENCOUNTER — Telehealth: Payer: Self-pay | Admitting: Cardiology

## 2023-03-26 ENCOUNTER — Emergency Department
Admission: EM | Admit: 2023-03-26 | Discharge: 2023-03-26 | Disposition: A | Discharge status: Home / Self Care | Payer: 59 | Attending: Emergency Medicine | Admitting: Emergency Medicine

## 2023-03-26 DIAGNOSIS — R079 Chest pain, unspecified: Secondary | ICD-10-CM | POA: Diagnosis not present

## 2023-03-26 DIAGNOSIS — I5032 Chronic diastolic (congestive) heart failure: Secondary | ICD-10-CM | POA: Insufficient documentation

## 2023-03-26 DIAGNOSIS — I110 Hypertensive heart disease with heart failure: Secondary | ICD-10-CM | POA: Insufficient documentation

## 2023-03-26 DIAGNOSIS — J449 Chronic obstructive pulmonary disease, unspecified: Secondary | ICD-10-CM | POA: Insufficient documentation

## 2023-03-26 DIAGNOSIS — D649 Anemia, unspecified: Secondary | ICD-10-CM | POA: Diagnosis not present

## 2023-03-26 DIAGNOSIS — I2510 Atherosclerotic heart disease of native coronary artery without angina pectoris: Secondary | ICD-10-CM | POA: Insufficient documentation

## 2023-03-26 LAB — CBC WITH DIFFERENTIAL/PLATELET
Abs Immature Granulocytes: 0.01 10*3/uL (ref 0.00–0.07)
Basophils Absolute: 0.1 10*3/uL (ref 0.0–0.1)
Basophils Relative: 1 %
Eosinophils Absolute: 0.2 10*3/uL (ref 0.0–0.5)
Eosinophils Relative: 3 %
HCT: 27 % — ABNORMAL LOW (ref 36.0–46.0)
Hemoglobin: 8.1 g/dL — ABNORMAL LOW (ref 12.0–15.0)
Immature Granulocytes: 0 %
Lymphocytes Relative: 37 %
Lymphs Abs: 2.2 10*3/uL (ref 0.7–4.0)
MCH: 26.6 pg (ref 26.0–34.0)
MCHC: 30 g/dL (ref 30.0–36.0)
MCV: 88.5 fL (ref 80.0–100.0)
Monocytes Absolute: 0.5 10*3/uL (ref 0.1–1.0)
Monocytes Relative: 8 %
Neutro Abs: 3 10*3/uL (ref 1.7–7.7)
Neutrophils Relative %: 51 %
Platelets: 266 10*3/uL (ref 150–400)
RBC: 3.05 MIL/uL — ABNORMAL LOW (ref 3.87–5.11)
RDW: 14.7 % (ref 11.5–15.5)
WBC: 6 10*3/uL (ref 4.0–10.5)
nRBC: 0 % (ref 0.0–0.2)

## 2023-03-26 LAB — COMPREHENSIVE METABOLIC PANEL
ALT: 12 U/L (ref 0–44)
AST: 19 U/L (ref 15–41)
Albumin: 3.3 g/dL — ABNORMAL LOW (ref 3.5–5.0)
Alkaline Phosphatase: 111 U/L (ref 38–126)
Anion gap: 7 (ref 5–15)
BUN: 14 mg/dL (ref 8–23)
CO2: 21 mmol/L — ABNORMAL LOW (ref 22–32)
Calcium: 8.8 mg/dL — ABNORMAL LOW (ref 8.9–10.3)
Chloride: 108 mmol/L (ref 98–111)
Creatinine, Ser: 1.29 mg/dL — ABNORMAL HIGH (ref 0.44–1.00)
GFR, Estimated: 46 mL/min — ABNORMAL LOW (ref >60)
Glucose, Bld: 85 mg/dL (ref 70–99)
Potassium: 4 mmol/L (ref 3.5–5.1)
Sodium: 136 mmol/L (ref 135–145)
Total Bilirubin: 0.4 mg/dL (ref 0.3–1.2)
Total Protein: 6.2 g/dL — ABNORMAL LOW (ref 6.5–8.1)

## 2023-03-26 LAB — TROPONIN I (HIGH SENSITIVITY): Troponin I (High Sensitivity): 4 ng/L (ref <18)

## 2023-03-26 LAB — TYPE AND SCREEN
ABO/RH(D): O POS
Antibody Screen: NEGATIVE

## 2023-03-26 LAB — PROTIME-INR
INR: 1 (ref 0.8–1.2)
Prothrombin Time: 12.9 seconds (ref 11.4–15.2)

## 2023-03-26 LAB — APTT: aPTT: 35 seconds (ref 24–36)

## 2023-03-26 MED ORDER — ACETAMINOPHEN 500 MG PO TABS
1000.0000 mg | ORAL_TABLET | Freq: Once | ORAL | Status: AC
  Administered 2023-03-26: 1000 mg via ORAL
  Filled 2023-03-26: qty 2

## 2023-03-26 NOTE — ED Notes (Signed)
When asking patient how she is going to get home states she does not have a ride home. Informed patient after talking with charge nurse that I could call any of her family members for her including her grandson that lives with her and that EMS most likely will not take her home due to that fact she is ambulatory and stable and if she did she would be billed for the ambulance ride home. Pt stated she would make a few calls and let me know.

## 2023-03-26 NOTE — Telephone Encounter (Signed)
Pt c/o of Chest Pain: STAT if CP now or developed within 24 hours  1. Are you having CP right now? Earlier  2. Are you experiencing any other symptoms (ex. SOB, nausea, vomiting, sweating)? SOB and lightheadedness  3. How long have you been experiencing CP? About a week now  4. Is your CP continuous or coming and going? Coming and going  5. Have you taken Nitroglycerin? No ?

## 2023-03-26 NOTE — ED Provider Notes (Signed)
[facility] Provider Note    Event Date/Time   First MD Initiated Contact with Patient 03/26/23 1839     (approximate)   History   Chest Pain   HPI  Traci Duran is a 67 y.o. female past medical history of carotid stenosis, COPD, coronary disease, hypertension hyperlipidemia heart failure with reduced EF who presents with chest pain.  3 days ago patient had sharp chest pain overnight that resolved.  No chest pain.  Today around this afternoon she felt sharp stabbing pain in the left side of her chest lasted for seconds and then resolved.  Has not had any severe pain since but does have an ongoing burning sensation in her chest.  Denies dyspnea diaphoresis nausea.  She does feel weak for about a week and has had some lightheadedness.  Also has had black stool for about a week.  Tells me she is history of GI bleeding but they have not been able to localize and gets her infusions at the cancer center for anemia.  Denies vomiting blood she is not on blood thinners.     Past Medical History:  Diagnosis Date   Anemia 05/2020   Thought to be related to GI bleed.   Anxiety    Bilateral carotid artery stenosis without cerebral infarction 10/2010    November 2011, CTA of the head and neck: Near complete occlusion versus severe stenosis of nearly the entire basilar artery, complete occlusion of the right vertebral artery from its origin off the right SCA, moderate noncalcified atherosclerotic in the proximal left SCA which results in a 40% stenosis. Right ICA with approximately 15% stenosis, left   Nov. 2012 Carotid Duplex: >75% diameter re   CHF (congestive heart failure), chronic HFrEF NYHA class II, chronic, diastolic (HCC)    Chronic HFrEF   COPD (chronic obstructive pulmonary disease) (HCC)    Coronary artery disease, non-occlusive 10/2009   Cardiac Cath: EF 85%;  40% prox &mid RCA -> 80% RPDA and AMrg, 60% RPAV.  40% prox & distal LAD w. 60% dLAD and D2, - Med  Rx; Myoview 9/19: Small size, Mod severity Apical Anterior defect  c/w ISCHEMIA. -> INTERMEDIATE RISK (med Rx because of anemia, and known distal LAD and PDA disease. ->  Similar defect noted in August 2009)   Depression    Gastritis    GERD (gastroesophageal reflux disease)    Hemorrhoids    Hyperlipidemia    Hypertension    Mild aortic stenosis by prior echocardiogram 06/2020   Mild aortic stenosis by echo   Moderate to severe mitral regurgitation 06/2020   Echo at [facility]: Moderate-severe MR with mild RV enlargement and severely elevated PA 95 mmHg.   Myocardial infarction [facility])    Demand ischemia infarction initially in 2010   PAD (peripheral artery disease) (HCC) 07/2008   a) 8/'09: normal ABIs; b) post procedure Dopplers December 2010: Suggestion of left PFA/CFA-SFV AV fistula.; c) 02/2010: Left CFA-PFA AV fistula still present.; d) 04/2010: CTA Abd/Pelvis- LE Runoff: Extensive Mixed plaque in the Abd Ao.  Patent visceral As.  Bilat 2V runnoff-feet ATA & Peroneal A;  (confirmed by Arteriogram 03/2011); e) ABIs October 2017 R ABI 0.88, L ABI 0.79.   Pulmonary hypertension (HCC) 06/2020   [facility] echo shows moderate-severe MR, moderate to severe TR with RVP estimated 95 mmHg.   Stenosis of left subclavian artery (HCC) 10/27/2010   ~75% by recent doppler -- CHECK BP ON R ARM (or bilaterally for confirmation)  Patient Active Problem List   Diagnosis Date Noted   AKI (acute kidney injury) 10/10/2022   Acute lower UTI 10/10/2022   Anxiety and depression 10/10/2022   Anemia, blood loss 10/10/2022   GI bleeding 10/09/2022   Carotid stenosis 01/08/2022   Moderate to severe mitral regurgitation 09/26/2021   Iron deficiency anemia 07/31/2020   History of GI bleed 07/17/2020   Other secondary pulmonary hypertension 06/2020   Trochanteric fracture of right femur 11/13/2019   Occult GI bleeding    Symptomatic anemia 10/06/2019   Chronic heart failure with preserved ejection fraction (HFpEF)  05/16/2019   Rectal bleed 11/19/2018   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   Benzodiazepine abuse 09/03/2018   Moderate recurrent major depression 09/03/2018   Hyponatremia 10/11/2017   Chest pain 07/28/2017   Acute blood loss anemia 07/28/2017   Demand ischemia    Benign neoplasm of descending colon    Polyp of sigmoid colon    Age related osteoporosis 04/16/2017   Iron deficiency anemia secondary to blood loss (chronic)    GI bleed 04/06/2017   Diastolic dysfunction 09/30/2016   Opioid contract exists 06/27/2016   Incidental lung nodule 01/18/2016   Traumatic hemorrhage of cerebrum 12/04/2015   Hx of traumatic brain injury 12/04/2015   Hypovitaminosis D 08/29/2015   Thyroid lesion 08/29/2015   Major depressive disorder, single episode, unspecified 08/27/2015   Basal ganglia hemorrhage 08/25/2015   Gastroesophageal reflux disease 01/26/2015   Osteopenia 01/26/2015   Anemia, unspecified 12/27/2014   History of fall 12/27/2014   Stenosis of left subclavian artery 09/26/2014   Greater tuberosity of humerus fracture 10/24/2013   AVF (arteriovenous fistula) 11/08/2011   Anxiety 10/24/2011   Cerebrovascular disease 10/24/2011   Dyslipidemia 10/24/2011   Essential hypertension - check pressures on R arm 10/24/2011   Tobacco abuse 10/24/2011   Palpitations 10/24/2011   Renal artery stenosis 10/24/2011   Unstable angina 10/14/2011   Hyperlipidemia LDL goal <70    Chronic obstructive pulmonary disease    Depression    PAD (peripheral artery disease)    Coronary artery disease, non-occlusive    Bilateral carotid artery stenosis without cerebral infarction 10/2010     Physical Exam  Triage Vital Signs: ED Triage Vitals  Enc Vitals Group     BP --      Pulse Rate 03/26/23 1836 77     Resp 03/26/23 1836 (!) 22     Temp 03/26/23 1836 98.3 F (36.8 C)     Temp Source 03/26/23 1836 Oral     SpO2 03/26/23 1836 99 %     Weight 03/26/23 1836 142 lb (64.4 kg)     Height  03/26/23 1836 5\' 7"  (1.702 m)     Head Circumference --      Peak Flow --      Pain Score 03/26/23 1833 8     Pain Loc --      Pain Edu? --      Excl. in GC? --     Most recent vital signs: Vitals:   03/26/23 2040 03/26/23 2139  BP: (!) 178/64 (!) 171/62  Pulse:    Resp: 17 14  Temp:    SpO2:       General: Awake, no distress.  CV:  Good peripheral perfusion.  No peripheral edema Resp:  Normal effort.  Lung sounds are clear Abd:  No distention.  Abdomen is soft and nontender throughout Neuro:  Awake, Alert, Oriented x 3  Other:  Aox3, nml speech  PERRL, EOMI, face symmetric, nml tongue movement  5/5 strength in the BL upper and lower extremities  Sensation grossly intact in the BL upper and lower extremities  Finger-nose-finger intact BL    ED Results / Procedures / Treatments  Labs (all labs ordered are listed, but only abnormal results are displayed) Labs Reviewed  COMPREHENSIVE METABOLIC PANEL - Abnormal; Notable for the following components:      Result Value   CO2 21 (*)    Creatinine, Ser 1.29 (*)    Calcium 8.8 (*)    Total Protein 6.2 (*)    Albumin 3.3 (*)    GFR, Estimated 46 (*)    All other components within normal limits  CBC WITH DIFFERENTIAL/PLATELET - Abnormal; Notable for the following components:   RBC 3.05 (*)    Hemoglobin 8.1 (*)    HCT 27.0 (*)    All other components within normal limits  PROTIME-INR  APTT  BRAIN NATRIURETIC PEPTIDE  TYPE AND SCREEN  TROPONIN I (HIGH SENSITIVITY)  TROPONIN I (HIGH SENSITIVITY)     EKG  EKG interpretation performed by myself: NSR, nml axis, nml intervals, no acute ischemic changes    RADIOLOGY    PROCEDURES:  Critical Care performed: No  Procedures  The patient is on the cardiac monitor to evaluate for evidence of arrhythmia and/or significant heart rate changes.   MEDICATIONS ORDERED IN ED: Medications  acetaminophen (TYLENOL) tablet 1,000 mg (1,000 mg Oral Given  03/26/23 2037)     IMPRESSION / MDM / ASSESSMENT AND PLAN / ED COURSE  I reviewed the triage vital signs and the nursing notes.                              Patient's presentation is most consistent with acute presentation with potential threat to life or bodily function.  Differential diagnosis includes, but is not limited to, symptomatic anemia, upper GI bleed, ACS, demand ischemia   Patient is a 67 year old female who presents with fatigue lightheadedness black stool and an episode of chest pain.  She had an episode of chest pain several nights ago which was short-lived and then an episode around 2:00 today which was sharp lasted for seconds and was not associated with any other symptoms.  She has been feeling fatigued and somewhat dizzy and lightheaded and is having dyspnea on exertion which is worse than normal.  She also is endorsing black stools x 1 week.  Patient is hypertensive vitals otherwise reassuring.  On exam she looks well abdominal exam is benign no peripheral edema lung sounds are clear and she has brown stool on rectal exam.  Hemoglobin today is 8.  It was 10.12 months ago.  BUN is normal.  She has been around 8 in the past.  EKG is nonischemic and first troponin is negative.  Coags are normal.  Chest x-ray is clear. On repeat assessment patient tells me she is feeling improved and she would like to go home.  I discussed with her that I would like to repeat the troponin but she was concerned about getting home and did not want to stay for repeat troponin.  Her chest pain was fleeting and lasted for seconds greater than 3 hours from the time when first troponin was drawn so I do think that she can safely be discharged.  I discussed with her her hemoglobin is  down trended from several months ago at 8.1 stressed the importance of continue to take her iron supplement and follow-up with oncology.  However given she is not having an active GI bleed do not feel that she needs admission.   Do not think she needs blood transfusion at this time.       FINAL CLINICAL IMPRESSION(S) / ED DIAGNOSES   Final diagnoses:  Anemia, unspecified type     Rx / DC Orders   ED Discharge Orders     None        Note:  This document was prepared using Dragon voice recognition software and may include unintentional dictation errors.   Georga Hacking, MD 03/26/23 [phone]

## 2023-03-26 NOTE — Telephone Encounter (Signed)
Spoke with patient and she reports chest pain radiating to her back. Other symptoms include SOB, lightheadedness, weakness, with pain that comes and goes. She wanted to schedule appointment with cardiologist for iron infusions and potassium. Advised that we do not manage iron infusions. Irregardless given her chest pain with shortness of breath instructed her to go to local ED for further evaluation and treatment.    Scheduled appointment for patient to see her cardiologist next week.

## 2023-03-26 NOTE — Discharge Instructions (Addendum)
Your hemoglobin is 8.1.  Supportive follow-up with oncology and continue to take the iron supplementation.  Please follow-up with your cardiologist.  If you have any additional episodes of chest pain that lasts more than just seconds then please return to the emergency department.

## 2023-03-26 NOTE — ED Notes (Signed)
Pt going to Xray

## 2023-03-26 NOTE — ED Notes (Signed)
Pt complaining she needs to get out of here. MD stated she was going to discharge patient due to patient wishes. Pt states "I hope she doesn't take forever with my discharge papers."

## 2023-03-26 NOTE — ED Notes (Signed)
Up with assistance to the toilet.

## 2023-03-26 NOTE — ED Triage Notes (Signed)
Pt coming from home via [facility]. Pt has been experiencing intermittent CP over the past week. Two days ago experienced CP for approximately 15 minutes and then it went away. Today, pain shot through L side chest today for approximately 30 seconds not radiating anywhere else in the body. Pt takes 81 mg of ASA every morning. Has hx of CHF, MI and peptic ulcers. Patient unaware of timeline but a few years ago with previous MI they decided they could not do stent due to vessels being too small. Pt also complains of black/tarry stools for about a week also, L kidney has been hurting for 3 weeks and always having a constant severe headache.

## 2023-03-26 NOTE — ED Notes (Addendum)
Patient called out saying her roommate is going to pick her up when she gets out. Is ripping off all leads, BP cuff and SPO2. And request IV's be removed. States "she knew she shouldn't have come here she could have gotten tylenol and BP meds at home if that was all we were going to do for her." MD aware of latest BP. Pt also has not had night dose of BP meds.

## 2023-03-31 ENCOUNTER — Inpatient Hospital Stay: Payer: 59

## 2023-03-31 ENCOUNTER — Other Ambulatory Visit: Payer: Self-pay | Admitting: *Deleted

## 2023-03-31 ENCOUNTER — Encounter: Payer: Self-pay | Admitting: Oncology

## 2023-03-31 ENCOUNTER — Ambulatory Visit: Payer: 59

## 2023-03-31 ENCOUNTER — Inpatient Hospital Stay (HOSPITAL_BASED_OUTPATIENT_CLINIC_OR_DEPARTMENT_OTHER): Payer: 59 | Admitting: Oncology

## 2023-03-31 VITALS — BP 110/62 | HR 81 | Resp 18

## 2023-03-31 VITALS — BP 140/65 | HR 76 | Temp 95.4°F | Resp 18 | Ht 67.0 in | Wt 148.0 lb

## 2023-03-31 DIAGNOSIS — D500 Iron deficiency anemia secondary to blood loss (chronic): Secondary | ICD-10-CM

## 2023-03-31 DIAGNOSIS — D509 Iron deficiency anemia, unspecified: Secondary | ICD-10-CM | POA: Diagnosis not present

## 2023-03-31 DIAGNOSIS — R519 Headache, unspecified: Secondary | ICD-10-CM

## 2023-03-31 DIAGNOSIS — E538 Deficiency of other specified B group vitamins: Secondary | ICD-10-CM

## 2023-03-31 DIAGNOSIS — D649 Anemia, unspecified: Secondary | ICD-10-CM | POA: Diagnosis not present

## 2023-03-31 DIAGNOSIS — Z79899 Other long term (current) drug therapy: Secondary | ICD-10-CM | POA: Diagnosis not present

## 2023-03-31 LAB — CBC WITH DIFFERENTIAL (CANCER CENTER ONLY)
Abs Immature Granulocytes: 0.02 10*3/uL (ref 0.00–0.07)
Basophils Absolute: 0 10*3/uL (ref 0.0–0.1)
Basophils Relative: 1 %
Eosinophils Absolute: 0.1 10*3/uL (ref 0.0–0.5)
Eosinophils Relative: 2 %
HCT: 23.6 % — ABNORMAL LOW (ref 36.0–46.0)
Hemoglobin: 7 g/dL — ABNORMAL LOW (ref 12.0–15.0)
Immature Granulocytes: 0 %
Lymphocytes Relative: 29 %
Lymphs Abs: 1.8 10*3/uL (ref 0.7–4.0)
MCH: 26 pg (ref 26.0–34.0)
MCHC: 29.7 g/dL — ABNORMAL LOW (ref 30.0–36.0)
MCV: 87.7 fL (ref 80.0–100.0)
Monocytes Absolute: 0.4 10*3/uL (ref 0.1–1.0)
Monocytes Relative: 7 %
Neutro Abs: 3.7 10*3/uL (ref 1.7–7.7)
Neutrophils Relative %: 61 %
Platelet Count: 270 10*3/uL (ref 150–400)
RBC: 2.69 MIL/uL — ABNORMAL LOW (ref 3.87–5.11)
RDW: 15.4 % (ref 11.5–15.5)
WBC Count: 6.1 10*3/uL (ref 4.0–10.5)
nRBC: 0 % (ref 0.0–0.2)

## 2023-03-31 LAB — IRON AND TIBC
Iron: 13 ug/dL — ABNORMAL LOW (ref 28–170)
Saturation Ratios: 4 % — ABNORMAL LOW (ref 10.4–31.8)
TIBC: 356 ug/dL (ref 250–450)
UIBC: 343 ug/dL

## 2023-03-31 LAB — BPAM RBC
Blood Product Expiration Date: 202405272359
Unit Type and Rh: 5100

## 2023-03-31 LAB — RETICULOCYTES
Immature Retic Fract: 11.8 % (ref 2.3–15.9)
RBC.: 2.7 MIL/uL — ABNORMAL LOW (ref 3.87–5.11)
Retic Count, Absolute: 85.5 10*3/uL (ref 19.0–186.0)
Retic Ct Pct: 3.2 % — ABNORMAL HIGH (ref 0.4–3.1)

## 2023-03-31 LAB — FOLATE: Folate: 6.6 ng/mL (ref >5.9)

## 2023-03-31 LAB — TYPE AND SCREEN

## 2023-03-31 LAB — LACTATE DEHYDROGENASE: LDH: 130 U/L (ref 98–192)

## 2023-03-31 LAB — FERRITIN: Ferritin: 9 ng/mL — ABNORMAL LOW (ref 11–307)

## 2023-03-31 LAB — PREPARE RBC (CROSSMATCH)

## 2023-03-31 MED ORDER — CYANOCOBALAMIN 1000 MCG/ML IJ SOLN
1000.0000 ug | INTRAMUSCULAR | Status: DC
  Administered 2023-03-31: 1000 ug via INTRAMUSCULAR
  Filled 2023-03-31: qty 1

## 2023-03-31 MED ORDER — ACETAMINOPHEN 325 MG PO TABS: 650.0000 mg | ORAL_TABLET | Freq: Once | ORAL | Status: DC

## 2023-03-31 MED ORDER — ACETAMINOPHEN 325 MG PO TABS
650.0000 mg | ORAL_TABLET | Freq: Once | ORAL | Status: AC
  Administered 2023-03-31: 650 mg via ORAL
  Filled 2023-03-31: qty 2

## 2023-03-31 MED ORDER — SODIUM CHLORIDE 0.9 % IV SOLN
Freq: Once | INTRAVENOUS | Status: AC
  Filled 2023-03-31: qty 250

## 2023-03-31 MED ORDER — SODIUM CHLORIDE 0.9 % IV SOLN
300.0000 mg | INTRAVENOUS | Status: DC
  Administered 2023-03-31: 300 mg via INTRAVENOUS
  Filled 2023-03-31: qty 300

## 2023-03-31 NOTE — Progress Notes (Signed)
Patient has had a lot of shortness of breathe, she was just seen in the emergency room 2 night ago.

## 2023-03-31 NOTE — Patient Instructions (Signed)

## 2023-03-31 NOTE — Progress Notes (Signed)
Hematology/Oncology Consult note [facility]  Telephone:([phone] [phone]  Patient Care Team: [name] as PCP - General   Name of the patient: Traci Duran  [id number]  01/07/1956   Date of visit: 03/31/23  Diagnosis-iron and B12 deficiency anemia  Chief complaint/ Reason for visit-routine follow-up of anemia  Heme/Onc history: Patient is a 67 year old female referred for iron deficiency anemia.  Most recent CBC from 07/16/2020 showed white count of 7.9, H&H of 7.2/25.6 with an MCV of 74.9 and a platelet count of 280.  Ferritin levels were low at 19 and iron studies showed low iron saturation of 4%.  B12 was low at 187.  Folate was normal.  She also underwent small bowel endoscopy for obscure GI bleeding which showed no evidence of any pathology in proximal jejunum medium sized hiatal hernia she has previously undergone EGD and colonoscopy in October and November 2020 as well she is here for consideration of IV iron.Patient had a repeat EGD and colonoscopy in November 2023.  EGD showed normal stomach and duodenal bulb.  Reflux esophagitis with no bleeding.  Colonoscopy showed nonbleeding external hemorrhoids.  2 polyps in the transverse colon which were resected and was negative for malignancy.   Patient has tolerated IV iron well in the past  Interval history-patient was in the ER a couple of days ago for symptoms of chest pain.  Cardiac workup was negative.  Today she reports feeling significant fatigue.  Denies any blood loss in her stool or urine.  Denies any dark melanotic stools  ECOG PS- 2 Pain scale- 0   Review of systems- Review of Systems  Constitutional:  Positive for malaise/fatigue. Negative for chills, fever and weight loss.  HENT:  Negative for congestion, ear discharge and nosebleeds.   Eyes:  Negative for blurred vision.  Respiratory:  Negative for cough, hemoptysis, sputum production, shortness of breath and  wheezing.   Cardiovascular:  Negative for chest pain, palpitations, orthopnea and claudication.  Gastrointestinal:  Negative for abdominal pain, blood in stool, constipation, diarrhea, heartburn, melena, nausea and vomiting.  Genitourinary:  Negative for dysuria, flank pain, frequency, hematuria and urgency.  Musculoskeletal:  Negative for back pain, joint pain and myalgias.  Skin:  Negative for rash.  Neurological:  Negative for dizziness, tingling, focal weakness, seizures, weakness and headaches.  Endo/Heme/Allergies:  Does not bruise/bleed easily.  Psychiatric/Behavioral:  Negative for depression and suicidal ideas. The patient does not have insomnia.       Allergies  Allergen Reactions   Chantix [Varenicline] Hives, Shortness Of Breath and Palpitations   Diphenhydramine Hcl Shortness Of Breath   Benadryl [Diphenhydramine Hcl] Rash   Niacin Rash   Trazodone Palpitations     Past Medical History:  Diagnosis Date   Anemia 05/2020   Thought to be related to GI bleed.   Anxiety    Bilateral carotid artery stenosis without cerebral infarction 10/2010    November 2011, CTA of the head and neck: Near complete occlusion versus severe stenosis of nearly the entire basilar artery, complete occlusion of the right vertebral artery from its origin off the right SCA, moderate noncalcified atherosclerotic in the proximal left SCA which results in a 40% stenosis. Right ICA with approximately 15% stenosis, left   Nov. 2012 Carotid Duplex: >75% diameter re   CHF (congestive heart failure), chronic HFrEF NYHA class II, chronic, diastolic (HCC)    Chronic HFrEF   COPD (chronic obstructive pulmonary disease)    Coronary  artery disease, non-occlusive 10/2009   Cardiac Cath: EF 85%;  40% prox &mid RCA -> 80% RPDA and AMrg, 60% RPAV.  40% prox & distal LAD w. 60% dLAD and D2, - Med Rx; Myoview 9/19: Small size, Mod severity Apical Anterior defect  c/w ISCHEMIA. -> INTERMEDIATE RISK (med Rx because of  anemia, and known distal LAD and PDA disease. ->  Similar defect noted in August 2009)   Depression    Gastritis    GERD (gastroesophageal reflux disease)    Hemorrhoids    Hyperlipidemia    Hypertension    Mild aortic stenosis by prior echocardiogram 06/2020   Mild aortic stenosis by echo   Moderate to severe mitral regurgitation 06/2020   Echo at [facility]: Moderate-severe MR with mild RV enlargement and severely elevated PA 95 mmHg.   Myocardial infarction    Demand ischemia infarction initially in 2010   PAD (peripheral artery disease) 07/2008   a) 8/'09: normal ABIs; b) post procedure Dopplers December 2010: Suggestion of left PFA/CFA-SFV AV fistula.; c) 02/2010: Left CFA-PFA AV fistula still present.; d) 04/2010: CTA Abd/Pelvis- LE Runoff: Extensive Mixed plaque in the Abd Ao.  Patent visceral As.  Bilat 2V runnoff-feet ATA & Peroneal A;  (confirmed by Arteriogram 03/2011); e) ABIs October 2017 R ABI 0.88, L ABI 0.79.   Pulmonary hypertension 06/2020   [facility] echo shows moderate-severe MR, moderate to severe TR with RVP estimated 95 mmHg.   Stenosis of left subclavian artery 10/27/2010   ~75% by recent doppler -- CHECK BP ON R ARM (or bilaterally for confirmation)     Past Surgical History:  Procedure Laterality Date   APPENDECTOMY     COLONOSCOPY WITH PROPOFOL N/A 06/09/2017   Procedure: COLONOSCOPY WITH PROPOFOL;  Surgeon: Midge Minium, MD;  Location[facility] ENDOSCOPY;  Service: Endoscopy;  Laterality: N/A;   COLONOSCOPY WITH PROPOFOL N/A 10/08/2019   Procedure: COLONOSCOPY WITH PROPOFOL;  Surgeon: Midge Minium, MD;  Location: [facility] ENDOSCOPY;  Service: Endoscopy;  Laterality: N/A;   COLONOSCOPY WITH PROPOFOL N/A 10/12/2022   Procedure: COLONOSCOPY WITH PROPOFOL;  Surgeon: Toney Reil, MD;  Location: [facility] ENDOSCOPY;  Service: Gastroenterology;  Laterality: N/A;   ENTEROSCOPY N/A 10/08/2019   Procedure: ENTEROSCOPY;  Surgeon: Midge Minium, MD;  Location: [facility] ENDOSCOPY;  Service:  Endoscopy;  Laterality: N/A;   ENTEROSCOPY N/A 07/18/2020   Procedure: ENTEROSCOPY;  Surgeon: Toney Reil, MD;  Location: [facility] ENDOSCOPY;  Service: Gastroenterology;  Laterality: N/A;   ESOPHAGOGASTRODUODENOSCOPY N/A 07/30/2017   Procedure: ESOPHAGOGASTRODUODENOSCOPY (EGD);  Surgeon: Toney Reil, MD;  Location: [facility] ENDOSCOPY;  Service: Gastroenterology;  Laterality: N/A;   ESOPHAGOGASTRODUODENOSCOPY (EGD) WITH PROPOFOL N/A 04/09/2017   Procedure: ESOPHAGOGASTRODUODENOSCOPY (EGD) WITH PROPOFOL;  Surgeon: Midge Minium, MD;  Location: [facility] ENDOSCOPY;  Service: Endoscopy;  Laterality: N/A;   ESOPHAGOGASTRODUODENOSCOPY (EGD) WITH PROPOFOL N/A 10/12/2022   Procedure: ESOPHAGOGASTRODUODENOSCOPY (EGD) WITH PROPOFOL;  Surgeon: Toney Reil, MD;  Location: [facility] ENDOSCOPY;  Service: Gastroenterology;  Laterality: N/A;   GIVENS CAPSULE STUDY N/A 10/08/2019   Procedure: GIVENS CAPSULE STUDY;  Surgeon: Midge Minium, MD;  Location: [facility] ENDOSCOPY;  Service: Endoscopy;  Laterality: N/A;   HIP FRACTURE SURGERY     LEFT HEART CATH AND CORONARY ANGIOGRAPHY  10/2009   [facility]: EF 85%;  40% prox &mid RCA -> 80% RPDA and AMrg, 60% RPAV.  40% prox & distal LAD w. 60% dLAD and D2, - Med Rx   LOWER EXTREMITY ARTERIOGRAM Bilateral 03/28/2011   ([facility]) RLE: moderate focal prox.  R> CIA stenosis, patent PFA, SFA, CFA, 2 vessel runoff via PT/peroneal, AT proximally occluded. LLE: moderate CFA stenosis, patent PFA, SFA and PA, 2 vessel runoff via AT/peroneal, PT occluded.   NM GATED MYOVIEW ([facility] HX)  08/2018   Small sized mild severity defect in the apical anterior wall concerning for ischemia.  INTERMEDIATE RISK. (Appears to be no change from 10-28-08)   NM MYOVIEW LTD  07/2008   ([facility]): A) 07/2008: Minimal reversible defect in the apex with possible ischemia.  Likely represents artifact.  EF 59% -> Cath: 80% PDA, 60% PAV and distal LAD dZ); B) 08/2010: Lexiscan - > ischemia the Ant & Inf  Apex.  EF 64%.(Med Rx); C) Jan 2013: + Apical Ischemia - unchanged; D) Jan 2016: EF 76%. No Ischemia /Infarct.   TRANSTHORACIC ECHOCARDIOGRAM  11/2009   [facility]) A) December 2010, 2D echo: EF >55%, mild concentric LVH, diastolic dysfunction Gr. I, LVIDd 4.2 cm, LVIDs 2.3 cm, mild TR (peak RVP 28 mmHg).; B) 2011/11/212D echo: EF >55%, mild concentric LVH, diastolic dysfunction Gr. I, LVIDd 4.3 cm, LVIDs 1.7 cm, mild PR (peak RVP 25 mmHg).; C) 12/2014 Echo: EF >55%, mild LVH, DD gr. I, LAE 3.9cm, Moderate TR (PRVP 35 mmHg)   TRANSTHORACIC ECHOCARDIOGRAM  06/13/2020   [facility]: EF 60-65%.  Normal LV function.  Indeterminate diastolic pressures.  Mild RV enlargement with severely elevated PAP estimated 95. moderate biatrial dilation.  Moderate to severe MR.  Moderate to severe TR.  Mild AS.    Social History   Socioeconomic History   Marital status: Widowed    Spouse name: Not on file   Number of children: 3   Years of education: Not on file   Highest education level: Not on file  Occupational History   Not on file  Tobacco Use   Smoking status: Every Day    Packs/day: 1.00    Years: 46.00    Additional pack years: 0.00    Total pack years: 46.00    Types: Cigarettes   Smokeless tobacco: Never   Tobacco comments:    0.5PPD 02/17/2022  Vaping Use   Vaping Use: Never used  Substance and Sexual Activity   Alcohol use: No   Drug use: Yes    Types: Marijuana    Comment: monthly   Sexual activity: Not Currently  Other Topics Concern   Not on file  Social History Narrative   She is a widowed mother of at least 3 daughters.  Unfortunately all 3 daughters have deceased.        2015: Her youngest daughter and son-in-law were run over by an [place] train   10/28/2017: Middle daughter died of complications of DKA   03/28/21: Oldest daughter died cancer after being on palliative care.   She also had a dog that died after being with her for 18 years.   Social Determinants  of Health   Financial Resource Strain: Low Risk  (10/11/2017)   Overall Financial Resource Strain (CARDIA)    Difficulty of Paying Living Expenses: Not hard at all  Food Insecurity: No Food Insecurity (10/10/2022)   Hunger Vital Sign    Worried About Running Out of Food in the Last Year: Never true    Ran Out of Food in the Last Year: Never true  Transportation Needs: No Transportation Needs (10/10/2022)   PRAPARE - Administrator, Civil Service (Medical): No    Lack of Transportation (Non-Medical): No  Physical  Activity: Inactive (10/11/2017)   Exercise Vital Sign    Days of Exercise per Week: 0 days    Minutes of Exercise per Session: 0 min  Stress: Stress Concern Present (10/11/2017)   [employer] of Occupational Health - Occupational Stress Questionnaire    Feeling of Stress : Very much  Social Connections: Unknown (10/11/2017)   Social Connection and Isolation Panel [NHANES]    Frequency of Communication with Friends and Family: Patient declined    Frequency of Social Gatherings with Friends and Family: Patient declined    Attends Religious Services: Patient declined    Database administrator or Organizations: Patient declined    Attends Banker Meetings: Patient declined    Marital Status: Patient declined  Intimate Partner Violence: Not At Risk (10/10/2022)   Humiliation, Afraid, Rape, and Kick questionnaire    Fear of Current or Ex-Partner: No    Emotionally Abused: No    Physically Abused: No    Sexually Abused: No    Family History  Problem Relation Age of Onset   Heart failure Mother    Heart attack Mother        mother died at 28 of an MI   Heart attack Father        father died at 32 of an MI   Heart attack Brother        Brother had an MI in his 46s     Current Outpatient Medications:    albuterol (PROVENTIL) (2.5 MG/3ML) 0.083% nebulizer solution, Take 3 mLs (2.5 mg total) by nebulization every 6 (six) hours as needed for wheezing  or shortness of breath., Disp: 75 mL, Rfl: 0   alprazolam (XANAX) 2 MG tablet, Take 2 mg by mouth 2 (two) times daily. , Disp: , Rfl:    atorvastatin (LIPITOR) 80 MG tablet, Take 80 mg by mouth daily., Disp: , Rfl:    busPIRone (BUSPAR) 10 MG tablet, Take 10 mg by mouth 2 (two) times daily. , Disp: , Rfl: 2   carvedilol (COREG) 6.25 MG tablet, Take 6.25 mg by mouth 2 (two) times daily., Disp: , Rfl:    citalopram (CELEXA) 40 MG tablet, Take 40 mg by mouth daily., Disp: , Rfl:    DULoxetine (CYMBALTA) 60 MG capsule, Take 60 mg by mouth daily., Disp: , Rfl:    ezetimibe (ZETIA) 10 MG tablet, Take 1 tablet (10 mg total) by mouth daily., Disp: 90 tablet, Rfl: 3   ferrous gluconate (FERGON) 324 MG tablet, Take 1 tablet (324 mg total) by mouth daily with breakfast., Disp: 30 tablet, Rfl: 1   furosemide (LASIX) 20 MG tablet, Take 1 tablet (20 mg total) by mouth daily., Disp: 90 tablet, Rfl: 1   gabapentin (NEURONTIN) 400 MG capsule, Take 400 mg by mouth 3 (three) times daily., Disp: , Rfl:    ipratropium-albuterol (DUONEB) 0.5-2.5 (3) MG/3ML SOLN, Take 3 mLs by nebulization every 6 (six) hours as needed., Disp: 360 mL, Rfl: 0   isosorbide mononitrate (IMDUR) 30 MG 24 hr tablet, Take 1 tablet (30 mg total) by mouth daily., Disp: 30 tablet, Rfl: 6   lisinopril (ZESTRIL) 10 MG tablet, TAKE ONE TABLET BY MOUTH EVERY DAY, Disp: 90 tablet, Rfl: 3   naproxen (NAPROSYN) 500 MG tablet, Take 1 tablet (500 mg total) by mouth 2 (two) times daily with a meal., Disp: 20 tablet, Rfl: 2   nitroGLYCERIN (NITROSTAT) 0.4 MG SL tablet, Place 0.4 mg under the tongue as needed., Disp: , Rfl:  oxyCODONE (OXY IR/ROXICODONE) 5 MG immediate release tablet, Take 1-2 tablets (5-10 mg total) by mouth every 4 (four) hours as needed for moderate pain or severe pain., Disp: 15 tablet, Rfl: 0   pantoprazole (PROTONIX) 40 MG tablet, Take 1 tablet (40 mg total) by mouth daily., Disp: 30 tablet, Rfl: 1   SYMBICORT 80-4.5 MCG/ACT inhaler,  Inhale 2 puffs into the lungs 2 (two) times daily., Disp: , Rfl:    Vitamin D, Ergocalciferol, (DRISDOL) 1.25 MG (50000 UNIT) CAPS capsule, Take by mouth., Disp: , Rfl:  No current facility-administered medications for this visit.  Facility-Administered Medications Ordered in Other Visits:    alteplase (CATHFLO ACTIVASE) injection 2 mg, 2 mg, Intracatheter, Once PRN, Creig Hines, MD   heparin lock flush 100 unit/mL, 500 Units, Intracatheter, Once PRN, Creig Hines, MD   heparin lock flush 100 unit/mL, 250 Units, Intracatheter, Once PRN, Creig Hines, MD   sodium chloride flush (NS) 0.9 % injection 10 mL, 10 mL, Intracatheter, Once PRN, Creig Hines, MD   sodium chloride flush (NS) 0.9 % injection 3 mL, 3 mL, Intracatheter, Once PRN, Creig Hines, MD  Physical exam: There were no vitals filed for this visit. Physical Exam Constitutional:      Comments: Sitting in a wheelchair.  Appears fatigued  Cardiovascular:     Rate and Rhythm: Regular rhythm. Tachycardia present.     Heart sounds: Normal heart sounds.  Pulmonary:     Effort: Pulmonary effort is normal.     Breath sounds: Normal breath sounds.  Abdominal:     General: Bowel sounds are normal.     Palpations: Abdomen is soft.  Skin:    General: Skin is warm and dry.  Neurological:     Mental Status: She is alert and oriented to person, place, and time.         Latest Ref Rng & Units 03/26/2023    7:19 PM  CMP  Glucose 70 - 99 mg/dL 85   BUN 8 - 23 mg/dL 14   Creatinine 1.61 - 1.00 mg/dL 0.96   Sodium 045 - 409 mmol/L 136   Potassium 3.5 - 5.1 mmol/L 4.0   Chloride 98 - 111 mmol/L 108   CO2 22 - 32 mmol/L 21   Calcium 8.9 - 10.3 mg/dL 8.8   Total Protein 6.5 - 8.1 g/dL 6.2   Total Bilirubin 0.3 - 1.2 mg/dL 0.4   Alkaline Phos 38 - 126 U/L 111   AST 15 - 41 U/L 19   ALT 0 - 44 U/L 12       Latest Ref Rng & Units 03/26/2023    7:19 PM  CBC  WBC 4.0 - 10.5 K/uL 6.0   Hemoglobin 12.0 - 15.0 g/dL 8.1    Hematocrit 81.1 - 46.0 % 27.0   Platelets 150 - 400 K/uL 266     No images are attached to the encounter.  DG Chest 2 View  Result Date: 03/26/2023 CLINICAL DATA:  Chest pain. EXAM: CHEST - 2 VIEW COMPARISON:  Chest radiograph dated 10/09/2022. FINDINGS: No focal consolidation, pleural effusion or pneumothorax. The cardiac silhouette is within normal limits. Atherosclerotic calcification of the aortic arch. No acute osseous pathology. IMPRESSION: No active cardiopulmonary disease. Electronically Signed   By: Elgie Collard M.D.   On: 03/26/2023 19:56     Assessment and plan- Patient is a 67 y.o. female here for routine follow-up of iron and B12 deficiency anemia  Normocytic anemia: Hemoglobin  has Continued to drift down from 10 in November 20 23 to 8.14 days ago and is presently at 7.  I am proceeding with 1 unit of PRBC transfusion later this week.  We have checked her iron studies and are currently pending and she will also receive another dose of 300 mg of Venofer today.  If labs are consistent with iron deficiency I will reach out to GI to see if she needs a capsule study or repeat endoscopy.  I am also doing additional anemia workup today including B12, myeloma panel, serum free light chains.  Reticulocyte count and LDH is normal therefore making hemolysis unlikely.  Haptoglobin is pending.  CBC ferritin and iron studies in 2 and 4 months and I will see her back in 4 months   Visit Diagnosis 1. Iron deficiency anemia, unspecified iron deficiency anemia type   2. B12 deficiency      Dr. Owens Shark, MD, MPH [facility] [phone] 03/31/2023 1:06 PM

## 2023-04-01 ENCOUNTER — Other Ambulatory Visit: Payer: Self-pay

## 2023-04-01 ENCOUNTER — Telehealth: Payer: Self-pay

## 2023-04-01 ENCOUNTER — Ambulatory Visit: Payer: 59 | Admitting: Cardiology

## 2023-04-01 ENCOUNTER — Encounter: Payer: Self-pay | Admitting: Oncology

## 2023-04-01 DIAGNOSIS — D509 Iron deficiency anemia, unspecified: Secondary | ICD-10-CM

## 2023-04-01 LAB — HAPTOGLOBIN: Haptoglobin: 138 mg/dL (ref 37–355)

## 2023-04-01 LAB — KAPPA/LAMBDA LIGHT CHAINS
Kappa free light chain: 41.7 mg/L — ABNORMAL HIGH (ref 3.3–19.4)
Kappa, lambda light chain ratio: 1.64 (ref 0.26–1.65)
Lambda free light chains: 25.5 mg/L (ref 5.7–26.3)

## 2023-04-01 LAB — VITAMIN B12: Vitamin B-12: 265 pg/mL (ref 180–914)

## 2023-04-01 NOTE — Telephone Encounter (Signed)
-----   Message from Toney Reil, MD sent at 04/01/2023  9:49 AM EDT ----- Reading from recent ER visit notes, she had melena  Morrie Sheldon, please schedule push enteroscopy tomorrow or Friday after blood transfusion  Thanks RV  ----- Message ----- From: Creig Hines, MD Sent: 04/01/2023   8:44 AM EDT To: Toney Reil, MD  Hello Rohini, You scoped her last. She now has severe iron deficiency and worsening H/H. Please see her asap. Thanks, Ovidio Kin

## 2023-04-01 NOTE — Telephone Encounter (Signed)
-----   Message from Toney Reil, MD sent at 04/01/2023 10:11 AM EDT ----- Morrie Sheldon, I want to see her today this afternoon  RV ----- Message ----- From: Creig Hines, MD Sent: 04/01/2023  10:01 AM EDT To: Toney Reil, MD; Denman George, CMA  She is getting blood tomorrow at cancer center

## 2023-04-01 NOTE — Telephone Encounter (Signed)
called patient but unable to leave a message because voicemail is full.

## 2023-04-01 NOTE — Telephone Encounter (Signed)
Patient states she has no transportation to come in the office today she is okay with scheduling a procedure. Schedule for 04/07/2023 went over instructions  and she wrote them down. She states her grandson will bring her to the procedure.

## 2023-04-01 NOTE — Telephone Encounter (Signed)
Called patient but unable to leave a message because voicemail is full. Tried 2 times to call patient

## 2023-04-02 ENCOUNTER — Inpatient Hospital Stay: Payer: 59

## 2023-04-02 DIAGNOSIS — D500 Iron deficiency anemia secondary to blood loss (chronic): Secondary | ICD-10-CM | POA: Diagnosis not present

## 2023-04-02 DIAGNOSIS — D649 Anemia, unspecified: Secondary | ICD-10-CM

## 2023-04-02 MED ORDER — ACETAMINOPHEN 325 MG PO TABS
650.0000 mg | ORAL_TABLET | Freq: Once | ORAL | Status: AC
  Administered 2023-04-02: 650 mg via ORAL
  Filled 2023-04-02: qty 2

## 2023-04-02 NOTE — Patient Instructions (Signed)

## 2023-04-03 LAB — TYPE AND SCREEN
ABO/RH(D): O POS
Antibody Screen: NEGATIVE
Unit division: 0

## 2023-04-03 LAB — BPAM RBC: Blood Product Expiration Date: 202405272359

## 2023-04-05 LAB — MULTIPLE MYELOMA PANEL, SERUM
Albumin SerPl Elph-Mcnc: 3.3 g/dL (ref 2.9–4.4)
Albumin/Glob SerPl: 1.3 (ref 0.7–1.7)
Alpha 1: 0.3 g/dL (ref 0.0–0.4)
Alpha2 Glob SerPl Elph-Mcnc: 0.8 g/dL (ref 0.4–1.0)
B-Globulin SerPl Elph-Mcnc: 0.8 g/dL (ref 0.7–1.3)
Gamma Glob SerPl Elph-Mcnc: 0.8 g/dL (ref 0.4–1.8)
Globulin, Total: 2.6 g/dL (ref 2.2–3.9)
IgA: 174 mg/dL (ref 87–352)
IgG (Immunoglobin G), Serum: 796 mg/dL (ref 586–1602)
IgM (Immunoglobulin M), Srm: 106 mg/dL (ref 26–217)
Total Protein ELP: 5.9 g/dL — ABNORMAL LOW (ref 6.0–8.5)

## 2023-04-06 ENCOUNTER — Encounter: Payer: Self-pay | Admitting: Gastroenterology

## 2023-04-06 ENCOUNTER — Telehealth: Payer: Self-pay

## 2023-04-06 DIAGNOSIS — D509 Iron deficiency anemia, unspecified: Secondary | ICD-10-CM

## 2023-04-06 NOTE — Addendum Note (Signed)
Addended by: Radene Knee L on: 04/06/2023 10:02 AM   Modules accepted: Orders

## 2023-04-06 NOTE — Telephone Encounter (Signed)
Order CBC for patient for tomorrow

## 2023-04-06 NOTE — Telephone Encounter (Signed)
Can she get the blood work after her procedure tomorrow because she has transportation issues.

## 2023-04-06 NOTE — Telephone Encounter (Signed)
In that case, will check it once she arrives before the procedure  RV

## 2023-04-06 NOTE — Telephone Encounter (Signed)
-----   Message from Toney Reil, MD sent at 04/06/2023  8:32 AM EDT ----- Let's check her Hb today or tomorrow  RV ----- Message ----- From: Creig Hines, MD Sent: 04/01/2023  10:01 AM EDT To: Toney Reil, MD; Denman George, CMA  She is getting blood tomorrow at cancer center

## 2023-04-07 ENCOUNTER — Encounter: Payer: Self-pay | Admitting: Gastroenterology

## 2023-04-07 ENCOUNTER — Ambulatory Visit
Admission: RE | Admit: 2023-04-07 | Discharge: 2023-04-07 | Disposition: A | Discharge status: Home / Self Care | Payer: 59 | Attending: Gastroenterology | Admitting: Gastroenterology

## 2023-04-07 ENCOUNTER — Ambulatory Visit: Payer: 59 | Admitting: Anesthesiology

## 2023-04-07 ENCOUNTER — Encounter
Admission: RE | Disposition: A | Discharge status: Home / Self Care | Payer: Self-pay | Source: Home / Self Care | Attending: Gastroenterology

## 2023-04-07 ENCOUNTER — Other Ambulatory Visit: Payer: Self-pay

## 2023-04-07 DIAGNOSIS — D500 Iron deficiency anemia secondary to blood loss (chronic): Secondary | ICD-10-CM | POA: Diagnosis present

## 2023-04-07 DIAGNOSIS — D509 Iron deficiency anemia, unspecified: Secondary | ICD-10-CM

## 2023-04-07 DIAGNOSIS — K31819 Angiodysplasia of stomach and duodenum without bleeding: Secondary | ICD-10-CM | POA: Insufficient documentation

## 2023-04-07 DIAGNOSIS — K5520 Angiodysplasia of colon without hemorrhage: Secondary | ICD-10-CM | POA: Diagnosis not present

## 2023-04-07 DIAGNOSIS — F17210 Nicotine dependence, cigarettes, uncomplicated: Secondary | ICD-10-CM | POA: Insufficient documentation

## 2023-04-07 DIAGNOSIS — D649 Anemia, unspecified: Secondary | ICD-10-CM

## 2023-04-07 HISTORY — PX: ENTEROSCOPY: SHX5533

## 2023-04-07 LAB — HEMOGLOBIN AND HEMATOCRIT, BLOOD
HCT: 32.9 % — ABNORMAL LOW (ref 36.0–46.0)
Hemoglobin: 10 g/dL — ABNORMAL LOW (ref 12.0–15.0)

## 2023-04-07 SURGERY — ENTEROSCOPY
Anesthesia: General

## 2023-04-07 MED ORDER — PROPOFOL 10 MG/ML IV BOLUS
INTRAVENOUS | Status: DC | PRN
  Administered 2023-04-07: 60 mg via INTRAVENOUS

## 2023-04-07 MED ORDER — SODIUM CHLORIDE 0.9% FLUSH: 10.0000 mL | INTRAVENOUS | Status: DC | PRN

## 2023-04-07 MED ORDER — SODIUM CHLORIDE 0.9 % IV SOLN: INTRAVENOUS | Status: DC

## 2023-04-07 MED ORDER — LABETALOL HCL 5 MG/ML IV SOLN
INTRAVENOUS | Status: DC | PRN
  Administered 2023-04-07: 5 mg via INTRAVENOUS

## 2023-04-07 MED ORDER — SODIUM CHLORIDE 0.9% FLUSH: 3.0000 mL | INTRAVENOUS | Status: DC | PRN

## 2023-04-07 MED ORDER — HEPARIN SOD (PORK) LOCK FLUSH 100 UNIT/ML IV SOLN: 500.0000 [IU] | Freq: Every day | INTRAVENOUS | Status: DC | PRN

## 2023-04-07 MED ORDER — SODIUM CHLORIDE 0.9% IV SOLUTION: 250.0000 mL | Freq: Once | INTRAVENOUS | Status: DC

## 2023-04-07 MED ORDER — LIDOCAINE HCL (CARDIAC) PF 100 MG/5ML IV SOSY
PREFILLED_SYRINGE | INTRAVENOUS | Status: DC | PRN
  Administered 2023-04-07: 50 mg via INTRAVENOUS

## 2023-04-07 MED ORDER — PROPOFOL 500 MG/50ML IV EMUL
INTRAVENOUS | Status: DC | PRN
  Administered 2023-04-07: 150 ug/kg/min via INTRAVENOUS

## 2023-04-07 MED ORDER — HEPARIN SOD (PORK) LOCK FLUSH 100 UNIT/ML IV SOLN: 250.0000 [IU] | INTRAVENOUS | Status: DC | PRN

## 2023-04-07 NOTE — Anesthesia Postprocedure Evaluation (Signed)
Anesthesia Post Note  Patient: Traci Duran  Procedure(s) Performed: ENTEROSCOPY  Patient location during evaluation: Endoscopy Anesthesia Type: General Level of consciousness: awake and alert Pain management: pain level controlled Vital Signs Assessment: post-procedure vital signs reviewed and stable Respiratory status: spontaneous breathing, nonlabored ventilation, respiratory function stable and patient connected to nasal cannula oxygen Cardiovascular status: blood pressure returned to baseline and stable Postop Assessment: no apparent nausea or vomiting Anesthetic complications: no   No notable events documented.   Last Vitals:  Vitals:   04/07/23 0911 04/07/23 0930  BP: (!) 95/52 99/75  Pulse: 62   Resp: 13   Temp:    SpO2: 90% 94%    Last Pain:  Vitals:   04/07/23 0940  TempSrc:   PainSc: 0-No pain                 Corinda Gubler

## 2023-04-07 NOTE — Anesthesia Preprocedure Evaluation (Addendum)
Anesthesia Evaluation  Patient identified by MRN, date of birth, ID band Patient awake    Reviewed: Allergy & Precautions, NPO status , Patient's Chart, lab work & pertinent test results  History of Anesthesia Complications Negative for: history of anesthetic complications  Airway Mallampati: II  TM Distance: >3 FB Neck ROM: Full    Dental no notable dental hx. (+) Teeth Intact   Pulmonary neg sleep apnea, COPD,  COPD inhaler, Current SmokerPatient did not abstain from smoking.   Pulmonary exam normal breath sounds clear to auscultation       Cardiovascular Exercise Tolerance: Good METShypertension, + CAD, + Past MI, + Peripheral Vascular Disease and +CHF  (-) dysrhythmias + Valvular Problems/Murmurs MR and AS  Rhythm:Regular Rate:Normal - Systolic murmurs  1. Left ventricular ejection fraction, by estimation, is 60 to 65%. The  left ventricle has normal function. The left ventricle has no regional  wall motion abnormalities. There is moderate left ventricular hypertrophy.  Left ventricular diastolic  parameters are indeterminate.   2. Right ventricular systolic function is normal. The right ventricular  size is normal. There is mildly elevated pulmonary artery systolic  pressure. The estimated right ventricular systolic pressure is 42.0 mmHg.   3. Left atrial size was mildly dilated.   4. The mitral valve is rheumatic. Moderate mitral valve regurgitation.  Mild mitral stenosis. The mean mitral valve gradient is 5.0 mmHg.   5. Tricuspid valve regurgitation is mild to moderate.   6. The aortic valve is calcified. There is moderate calcification of the  aortic valve. Aortic valve regurgitation is not visualized. Mild aortic  valve stenosis.   7. The inferior vena cava is normal in size with greater than 50%  respiratory variability, suggesting right atrial pressure of 3 mmHg.     Neuro/Psych  PSYCHIATRIC DISORDERS Anxiety  Depression    negative neurological ROS     GI/Hepatic ,GERD  ,,(+)     (-) substance abuse    Endo/Other  neg diabetes    Renal/GU negative Renal ROS     Musculoskeletal   Abdominal   Peds  Hematology  (+) Blood dyscrasia, anemia   Anesthesia Other Findings Past Medical History: 05/2020: Anemia     Comment:  Thought to be related to GI bleed. No date: Anxiety 10/2010: Bilateral carotid artery stenosis without cerebral infarction     Comment:   November 2011, CTA of the head and neck: Near complete              occlusion versus severe stenosis of nearly the entire               basilar artery, complete occlusion of the right vertebral              artery from its origin off the right SCA, moderate               noncalcified atherosclerotic in the proximal left SCA               which results in a 40% stenosis. Right ICA with               approximately 15% stenosis, left   Nov. 2012 Carotid               Duplex: >75% diameter re No date: CHF (congestive heart failure), chronic HFrEF NYHA class II,  chronic, diastolic (HCC)     Comment:  Chronic HFrEF No date: COPD (chronic obstructive pulmonary disease) (HCC) 10/2009:  Coronary artery disease, non-occlusive     Comment:  Cardiac Cath: EF 85%;  40% prox &mid RCA -> 80% RPDA and              AMrg, 60% RPAV.  40% prox & distal LAD w. 60% dLAD and               D2, - Med Rx; Myoview 9/19: Small size, Mod severity               Apical Anterior defect  c/w ISCHEMIA. -> INTERMEDIATE               RISK (med Rx because of anemia, and known distal LAD and               PDA disease. ->  Similar defect noted in August 2009) No date: Depression No date: Gastritis No date: GERD (gastroesophageal reflux disease) No date: Hemorrhoids No date: Hyperlipidemia No date: Hypertension 06/2020: Mild aortic stenosis by prior echocardiogram     Comment:  Mild aortic stenosis by echo 06/2020: Moderate to severe mitral regurgitation      Comment:  Echo at [facility]: Moderate-severe MR with mild RV               enlargement and severely elevated PA 95 mmHg. No date: Myocardial infarction [facility])     Comment:  Demand ischemia infarction initially in 2010 07/2008: PAD (peripheral artery disease) (HCC)     Comment:  a) 8/'09: normal ABIs; b) post procedure Dopplers               December 2010: Suggestion of left PFA/CFA-SFV AV               fistula.; c) 02/2010: Left CFA-PFA AV fistula still               present.; d) 04/2010: CTA Abd/Pelvis- LE Runoff: Extensive              Mixed plaque in the Abd Ao.  Patent visceral As.  Bilat               2V runnoff-feet ATA & Peroneal A;  (confirmed by               Arteriogram 03/2011); e) ABIs October 2017 R ABI 0.88, L               ABI 0.79. 06/2020: Pulmonary hypertension (HCC)     Comment:  [facility] echo shows moderate-severe MR, moderate to severe               TR with RVP estimated 95 mmHg. 10/27/2010: Stenosis of left subclavian artery (HCC)     Comment:  ~75% by recent doppler -- CHECK BP ON R ARM (or               bilaterally for confirmation)  Reproductive/Obstetrics                              Anesthesia Physical Anesthesia Plan  ASA: 3  Anesthesia Plan: General   Post-op Pain Management: Minimal or no pain anticipated   Induction: Intravenous  PONV Risk Score and Plan: 2 and Propofol infusion, TIVA and Ondansetron  Airway Management Planned: Nasal Cannula  Additional Equipment: None  Intra-op Plan:   Post-operative Plan:   Informed Consent: I have reviewed the patients History and Physical, chart, labs and discussed the procedure including the  risks, benefits and alternatives for the proposed anesthesia with the patient or authorized representative who has indicated his/her understanding and acceptance.     Dental advisory given  Plan Discussed with: CRNA and Surgeon  Anesthesia Plan Comments: (Discussed risks of anesthesia with patient,  including possibility of difficulty with spontaneous ventilation under anesthesia necessitating airway intervention, PONV, and rare risks such as cardiac or respiratory or neurological events, and allergic reactions. Discussed the role of CRNA in patient's perioperative care. Patient understands. Patient counseled on benefits of smoking cessation, and increased perioperative risks associated with continued smoking. )         Anesthesia Quick Evaluation

## 2023-04-07 NOTE — Op Note (Signed)
[facility] Gastroenterology Patient Name: Traci Duran Procedure Date: 04/07/2023 8:35 AM MRN: [id number] Account #: [id number] Date of Birth: 1956-07-12 Admit Type: Outpatient Age: 67 Room: [facility] ENDO ROOM 3 Gender: Female Note Status: Finalized Instrument Name: Peds Colonoscope [id number] Procedure:             Small bowel enteroscopy Indications:           Iron deficiency anemia secondary to chronic blood                         loss, Iron deficiency anemia, Melena, GI bleeding                         source not documented by previous EGD and colonoscopy,                         Recurrent bleeding in the small bowel, Arteriovenous                         malformation in the small intestine Providers:             Toney Reil MD, MD Referring MD:          Kateri Mc [facility] [place] (Referring MD) Medicines:             General Anesthesia Complications:         No immediate complications. Estimated blood loss: None. Procedure:             Pre-Anesthesia Assessment:                        - Prior to the procedure, a History and Physical was                         performed, and patient medications and allergies were                         reviewed. The patient is competent. The risks and                         benefits of the procedure and the sedation options and                         risks were discussed with the patient. All questions                         were answered and informed consent was obtained.                         Patient identification and proposed procedure were                         verified by the physician, the nurse, the                         anesthesiologist, the anesthetist and the technician                         in the pre-procedure area in the procedure room in the  endoscopy suite. Mental Status Examination: alert and                         oriented. Airway Examination: normal oropharyngeal                          airway and neck mobility. Respiratory Examination:                         clear to auscultation. CV Examination: normal.                         Prophylactic Antibiotics: The patient does not require                         prophylactic antibiotics. Prior Anticoagulants: The                         patient has taken no anticoagulant or antiplatelet                         agents. ASA Grade Assessment: III - A patient with                         severe systemic disease. After reviewing the risks and                         benefits, the patient was deemed in satisfactory                         condition to undergo the procedure. The anesthesia                         plan was to use general anesthesia. Immediately prior                         to administration of medications, the patient was                         re-assessed for adequacy to receive sedatives. The                         heart rate, respiratory rate, oxygen saturations,                         blood pressure, adequacy of pulmonary ventilation, and                         response to care were monitored throughout the                         procedure. The physical status of the patient was                         re-assessed after the procedure.                        After obtaining informed consent, the endoscope was  passed under direct vision. Throughout the procedure,                         the patient's blood pressure, pulse, and oxygen                         saturations were monitored continuously. The                         Colonoscope was introduced through the mouth and                         advanced to the mid-jejunum. The small bowel                         enteroscopy was accomplished without difficulty. The                         patient tolerated the procedure well. Findings:      Two angiodysplastic lesions with no bleeding were found in the proximal        jejunum and in the mid-jejunum. Coagulation for hemostasis using bipolar       probe was successful. Estimated blood loss: none.      There was no evidence of significant pathology in the entire examined       duodenum.      The esophagus was normal.      The stomach was normal. Impression:            - Two non-bleeding angiodysplastic lesions in the                         duodenum. Treated with bipolar cautery.                        - Normal examined duodenum.                        - Normal esophagus.                        - Normal stomach.                        - No specimens collected. Recommendation:        - Discharge patient to home (with escort).                        - Continue present medications.                        - Follow an antireflux regimen for the rest of the                         patient's life.                        - Avoid heavy NSAID use, BC powder or Goody powder Procedure Code(s):     --- Professional ---                        [phone], Small intestinal endoscopy,  enteroscopy beyond                         second portion of duodenum, not including ileum; with                         control of bleeding (eg, injection, bipolar cautery,                         unipolar cautery, laser, heater probe, stapler, plasma                         coagulator) Diagnosis Code(s):     --- Professional ---                        K31.819, Angiodysplasia of stomach and duodenum                         without bleeding                        D50.0, Iron deficiency anemia secondary to blood loss                         (chronic)                        D50.9, Iron deficiency anemia, unspecified                        K92.1, Melena (includes Hematochezia)                        K92.2, Gastrointestinal hemorrhage, unspecified CPT copyright 2022 [employer]. All rights reserved. The codes documented in this report are preliminary and upon coder review may   be revised to meet current compliance requirements. Dr. Libby Maw Toney Reil MD, MD 04/07/2023 9:09:33 AM This report has been signed electronically. Number of Addenda: 0 Note Initiated On: 04/07/2023 8:35 AM Estimated Blood Loss:  Estimated blood loss: none.      [facility]

## 2023-04-07 NOTE — H&P (Signed)
Arlyss Repress, MD [address]  [address]  [place], [place] [address]  Main: [phone]  Fax: [phone] Pager: [phone]  Primary Care Physician:  [name] Primary Gastroenterologist:  Dr. Arlyss Repress  Pre-Procedure History & Physical: HPI:  Traci Duran is a 67 y.o. female is here for a push enteroscopy.  Patient reported that 3 weeks ago, she had 2 days of black tarry stools and that she was taking BC powder for migraine headaches.  She currently stopped taking it.  She is currently having brown bowel movements.  Received 1 unit of PRBCs at cancer center day before yesterday, also received parenteral iron   Past Medical History:  Diagnosis Date   Anemia 05/2020   Thought to be related to GI bleed.   Anxiety    Bilateral carotid artery stenosis without cerebral infarction 10/2010    November 2011, CTA of the head and neck: Near complete occlusion versus severe stenosis of nearly the entire basilar artery, complete occlusion of the right vertebral artery from its origin off the right SCA, moderate noncalcified atherosclerotic in the proximal left SCA which results in a 40% stenosis. Right ICA with approximately 15% stenosis, left   Nov. 2012 Carotid Duplex: >75% diameter re   CHF (congestive heart failure), chronic HFrEF NYHA class II, chronic, diastolic (HCC)    Chronic HFrEF   COPD (chronic obstructive pulmonary disease) (HCC)    Coronary artery disease, non-occlusive 10/2009   Cardiac Cath: EF 85%;  40% prox &mid RCA -> 80% RPDA and AMrg, 60% RPAV.  40% prox & distal LAD w. 60% dLAD and D2, - Med Rx; Myoview 9/19: Small size, Mod severity Apical Anterior defect  c/w ISCHEMIA. -> INTERMEDIATE RISK (med Rx because of anemia, and known distal LAD and PDA disease. ->  Similar defect noted in August 2009)   Depression    Gastritis    GERD (gastroesophageal reflux disease)    Hemorrhoids    Hyperlipidemia    Hypertension    Mild aortic stenosis by  prior echocardiogram 06/2020   Mild aortic stenosis by echo   Moderate to severe mitral regurgitation 06/2020   Echo at [facility] Moderate-severe MR with mild RV enlargement and severely elevated PA 95 mmHg.   Myocardial infarction [facility])    Demand ischemia infarction initially in 2010   PAD (peripheral artery disease) (HCC) 07/2008   a) 8/'09: normal ABIs; b) post procedure Dopplers December 2010: Suggestion of left PFA/CFA-SFV AV fistula.; c) 02/2010: Left CFA-PFA AV fistula still present.; d) 04/2010: CTA Abd/Pelvis- LE Runoff: Extensive Mixed plaque in the Abd Ao.  Patent visceral As.  Bilat 2V runnoff-feet ATA & Peroneal A;  (confirmed by Arteriogram 03/2011); e) ABIs October 2017 R ABI 0.88, L ABI 0.79.   Pulmonary hypertension (HCC) 06/2020   [facility] echo shows moderate-severe MR, moderate to severe TR with RVP estimated 95 mmHg.   Stenosis of left subclavian artery (HCC) 10/27/2010   ~75% by recent doppler -- CHECK BP ON R ARM (or bilaterally for confirmation)    Past Surgical History:  Procedure Laterality Date   APPENDECTOMY     COLONOSCOPY WITH PROPOFOL N/A 06/09/2017   Procedure: COLONOSCOPY WITH PROPOFOL;  Surgeon: Midge Minium, MD;  Location: [facility] ENDOSCOPY;  Service: Endoscopy;  Laterality: N/A;   COLONOSCOPY WITH PROPOFOL N/A 10/08/2019   Procedure: COLONOSCOPY WITH PROPOFOL;  Surgeon: Midge Minium, MD;  Location: [facility] ENDOSCOPY;  Service: Endoscopy;  Laterality: N/A;   COLONOSCOPY WITH PROPOFOL N/A  10/12/2022   Procedure: COLONOSCOPY WITH PROPOFOL;  Surgeon: Toney Reil, MD;  Location: [facility] ENDOSCOPY;  Service: Gastroenterology;  Laterality: N/A;   ENTEROSCOPY N/A 10/08/2019   Procedure: ENTEROSCOPY;  Surgeon: Midge Minium, MD;  Location: [facility] ENDOSCOPY;  Service: Endoscopy;  Laterality: N/A;   ENTEROSCOPY N/A 07/18/2020   Procedure: ENTEROSCOPY;  Surgeon: Toney Reil, MD;  Location: [facility] ENDOSCOPY;  Service: Gastroenterology;  Laterality: N/A;    ESOPHAGOGASTRODUODENOSCOPY N/A 07/30/2017   Procedure: ESOPHAGOGASTRODUODENOSCOPY (EGD);  Surgeon: Toney Reil, MD;  Location: [facility] ENDOSCOPY;  Service: Gastroenterology;  Laterality: N/A;   ESOPHAGOGASTRODUODENOSCOPY (EGD) WITH PROPOFOL N/A 04/09/2017   Procedure: ESOPHAGOGASTRODUODENOSCOPY (EGD) WITH PROPOFOL;  Surgeon: Midge Minium, MD;  Location: [facility] ENDOSCOPY;  Service: Endoscopy;  Laterality: N/A;   ESOPHAGOGASTRODUODENOSCOPY (EGD) WITH PROPOFOL N/A 10/12/2022   Procedure: ESOPHAGOGASTRODUODENOSCOPY (EGD) WITH PROPOFOL;  Surgeon: Toney Reil, MD;  Location: [facility] ENDOSCOPY;  Service: Gastroenterology;  Laterality: N/A;   GIVENS CAPSULE STUDY N/A 10/08/2019   Procedure: GIVENS CAPSULE STUDY;  Surgeon: Midge Minium, MD;  Location: [facility] ENDOSCOPY;  Service: Endoscopy;  Laterality: N/A;   HIP FRACTURE SURGERY     LEFT HEART CATH AND CORONARY ANGIOGRAPHY  10/2009   [facility]: EF 85%;  40% prox &mid RCA -> 80% RPDA and AMrg, 60% RPAV.  40% prox & distal LAD w. 60% dLAD and D2, - Med Rx   LOWER EXTREMITY ARTERIOGRAM Bilateral 03/28/2011   ([facility]) RLE: moderate focal prox. R> CIA stenosis, patent PFA, SFA, CFA, 2 vessel runoff via PT/peroneal, AT proximally occluded. LLE: moderate CFA stenosis, patent PFA, SFA and PA, 2 vessel runoff via AT/peroneal, PT occluded.   NM GATED MYOVIEW ([facility] HX)  08/2018   Small sized mild severity defect in the apical anterior wall concerning for ischemia.  INTERMEDIATE RISK. (Appears to be no change from 2009)   NM MYOVIEW LTD  07/2008   ([facility]): A) 07/2008: Minimal reversible defect in the apex with possible ischemia.  Likely represents artifact.  EF 59% -> Cath: 80% PDA, 60% PAV and distal LAD dZ); B) 08/2010: Lexiscan - > ischemia the Ant & Inf Apex.  EF 64%.(Med Rx); C) Jan 2013: + Apical Ischemia - unchanged; D) Jan 2016: EF 76%. No Ischemia /Infarct.   TRANSTHORACIC ECHOCARDIOGRAM  11/2009   [facility]) A) December  2010, 2D echo: EF >55%, mild concentric LVH, diastolic dysfunction Gr. I, LVIDd 4.2 cm, LVIDs 2.3 cm, mild TR (peak RVP 28 mmHg).; B) November 2011, 2D echo: EF >55%, mild concentric LVH, diastolic dysfunction Gr. I, LVIDd 4.3 cm, LVIDs 1.7 cm, mild PR (peak RVP 25 mmHg).; C) 12/2014 Echo: EF >55%, mild LVH, DD gr. I, LAE 3.9cm, Moderate TR (PRVP 35 mmHg)   TRANSTHORACIC ECHOCARDIOGRAM  06/13/2020   [facility]: EF 60-65%.  Normal LV function.  Indeterminate diastolic pressures.  Mild RV enlargement with severely elevated PAP estimated 95. moderate biatrial dilation.  Moderate to severe MR.  Moderate to severe TR.  Mild AS.     Current Facility-Administered Medications:    0.9 %  sodium chloride infusion (Manually program via Guardrails IV Fluids), 250 mL, Intravenous, Once, Creig Hines, MD   0.9 %  sodium chloride infusion, , Intravenous, Continuous, Midori Dado, Loel Dubonnet, MD, Last Rate: 20 mL/hr at 04/07/23 0823, New Bag at 04/07/23 0823   heparin lock flush 100 unit/mL, 500 Units, Intracatheter, Daily PRN, Creig Hines, MD   heparin lock flush 100 unit/mL, 250 Units, Intracatheter, PRN, Creig Hines, MD  sodium chloride flush (NS) 0.9 % injection 10 mL, 10 mL, Intracatheter, PRN, Creig Hines, MD   sodium chloride flush (NS) 0.9 % injection 3 mL, 3 mL, Intracatheter, PRN, Creig Hines, MD  Facility-Administered Medications Ordered in Other Encounters:    alteplase (CATHFLO ACTIVASE) injection 2 mg, 2 mg, Intracatheter, Once PRN, Creig Hines, MD   heparin lock flush 100 unit/mL, 500 Units, Intracatheter, Once PRN, Creig Hines, MD   heparin lock flush 100 unit/mL, 250 Units, Intracatheter, Once PRN, Creig Hines, MD   sodium chloride flush (NS) 0.9 % injection 10 mL, 10 mL, Intracatheter, Once PRN, Creig Hines, MD   sodium chloride flush (NS) 0.9 % injection 3 mL, 3 mL, Intracatheter, Once PRN, Creig Hines, MD   Allergies as of 04/01/2023 - Review Complete 03/31/2023   Allergen Reaction Noted   Chantix [varenicline] Hives, Shortness Of Breath, and Palpitations 09/06/2012   Diphenhydramine hcl Shortness Of Breath    Benadryl [diphenhydramine hcl] Rash 10/14/2011   Niacin Rash    Trazodone Palpitations 05/23/2015    Family History  Problem Relation Age of Onset   Heart failure Mother    Heart attack Mother        mother died at 50 of an MI   Heart attack Father        father died at 71 of an MI   Heart attack Brother        Brother had an MI in his 68s    Social History   Socioeconomic History   Marital status: Widowed    Spouse name: Not on file   Number of children: 3   Years of education: Not on file   Highest education level: Not on file  Occupational History   Not on file  Tobacco Use   Smoking status: Every Day    Packs/day: 1.00    Years: 46.00    Additional pack years: 0.00    Total pack years: 46.00    Types: Cigarettes   Smokeless tobacco: Never   Tobacco comments:    0.5PPD 02/17/2022  Vaping Use   Vaping Use: Never used  Substance and Sexual Activity   Alcohol use: No   Drug use: Yes    Types: Marijuana    Comment: occasional   Sexual activity: Not Currently  Other Topics Concern   Not on file  Social History Narrative   She is a widowed mother of at least 3 daughters.  Unfortunately all 3 daughters have deceased.        2015: Her youngest daughter and son-in-law were run over by an [place] train   04-22-2017: Middle daughter died of complications of DKA   03/23/21: Oldest daughter died cancer after being on palliative care.   She also had a dog that died after being with her for 18 years.   Social Determinants of Health   Financial Resource Strain: Low Risk  (10/11/2017)   Overall Financial Resource Strain (CARDIA)    Difficulty of Paying Living Expenses: Not hard at all  Food Insecurity: No Food Insecurity (10/10/2022)   Hunger Vital Sign    Worried About Running Out of Food in the Last Year: Never true    Ran  Out of Food in the Last Year: Never true  Transportation Needs: No Transportation Needs (10/10/2022)   PRAPARE - Administrator, Civil Service (Medical): No    Lack of Transportation (Non-Medical): No  Physical Activity: Inactive (  10/11/2017)   Exercise Vital Sign    Days of Exercise per Week: 0 days    Minutes of Exercise per Session: 0 min  Stress: Stress Concern Present (10/11/2017)   [employer] of Occupational Health - Occupational Stress Questionnaire    Feeling of Stress : Very much  Social Connections: Unknown (10/11/2017)   Social Connection and Isolation Panel [NHANES]    Frequency of Communication with Friends and Family: Patient declined    Frequency of Social Gatherings with Friends and Family: Patient declined    Attends Religious Services: Patient declined    Database administrator or Organizations: Patient declined    Attends Banker Meetings: Patient declined    Marital Status: Patient declined  Intimate Partner Violence: Not At Risk (10/10/2022)   Humiliation, Afraid, Rape, and Kick questionnaire    Fear of Current or Ex-Partner: No    Emotionally Abused: No    Physically Abused: No    Sexually Abused: No    Review of Systems: See HPI, otherwise negative ROS  Physical Exam: BP 137/72   Pulse 63   Temp (!) 96.2 F (35.7 C) (Temporal)   Resp 18   Ht 5\' 7"  (1.702 m)   Wt 65.3 kg   SpO2 98%   BMI 22.55 kg/m  General:   Alert,  pleasant and cooperative in NAD Head:  Normocephalic and atraumatic. Neck:  Supple; no masses or thyromegaly. Lungs:  Clear throughout to auscultation.    Heart:  Regular rate and rhythm. Abdomen:  Soft, nontender and nondistended. Normal bowel sounds, without guarding, and without rebound.   Neurologic:  Alert and  oriented x4;  grossly normal neurologically.  Impression/Plan: Traci Duran is here for a push enteroscopy to be performed for acute on chronic blood loss anemia, history of small bowel  AVMs  Risks, benefits, limitations, and alternatives regarding push enteroscopy have been reviewed with the patient.  Questions have been answered.  All parties agreeable.   Lannette Donath, MD  04/07/2023, 8:33 AM

## 2023-04-07 NOTE — Transfer of Care (Signed)
Immediate Anesthesia Transfer of Care Note  Patient: Traci Duran  Procedure(s) Performed: ENTEROSCOPY  Patient Location: Endoscopy Unit  Anesthesia Type:General  Level of Consciousness: sedated and responds to stimulation  Airway & Oxygen Therapy: Patient Spontanous Breathing and Patient connected to nasal cannula oxygen  Post-op Assessment: Report given to RN and Post -op Vital signs reviewed and stable  Post vital signs: Reviewed and stable  Last Vitals:  Vitals Value Taken Time  BP 95/52 04/07/23 0911  Temp 36.3 C 04/07/23 0909  Pulse 62 04/07/23 0912  Resp 12 04/07/23 0912  SpO2 94 % 04/07/23 0912  Vitals shown include unvalidated device data.  Last Pain:  Vitals:   04/07/23 0909  TempSrc: Temporal  PainSc: Asleep         Complications: No notable events documented.

## 2023-04-07 NOTE — Anesthesia Procedure Notes (Signed)
Date/Time: 04/07/2023 8:38 AM  Performed by: Ginger Carne, CRNAPre-anesthesia Checklist: Patient identified, Emergency Drugs available, Suction available, Patient being monitored and Timeout performed Patient Re-evaluated:Patient Re-evaluated prior to induction Oxygen Delivery Method: Nasal cannula Preoxygenation: Pre-oxygenation with 100% oxygen Induction Type: IV induction

## 2023-04-08 ENCOUNTER — Encounter: Payer: Self-pay | Admitting: Gastroenterology

## 2023-04-14 ENCOUNTER — Encounter: Payer: Self-pay | Admitting: Gastroenterology

## 2023-06-01 ENCOUNTER — Inpatient Hospital Stay: Payer: 59

## 2023-06-02 ENCOUNTER — Encounter: Payer: Self-pay | Admitting: Oncology

## 2023-06-02 NOTE — Telephone Encounter (Signed)
Error

## 2023-06-03 ENCOUNTER — Inpatient Hospital Stay: Payer: 59 | Attending: Nurse Practitioner

## 2023-06-03 DIAGNOSIS — D500 Iron deficiency anemia secondary to blood loss (chronic): Secondary | ICD-10-CM

## 2023-06-03 DIAGNOSIS — D509 Iron deficiency anemia, unspecified: Secondary | ICD-10-CM | POA: Diagnosis present

## 2023-06-03 DIAGNOSIS — E538 Deficiency of other specified B group vitamins: Secondary | ICD-10-CM | POA: Diagnosis not present

## 2023-06-03 LAB — BASIC METABOLIC PANEL
Anion gap: 9 (ref 5–15)
BUN: 12 mg/dL (ref 8–23)
CO2: 21 mmol/L — ABNORMAL LOW (ref 22–32)
Calcium: 8.8 mg/dL — ABNORMAL LOW (ref 8.9–10.3)
Chloride: 104 mmol/L (ref 98–111)
Creatinine, Ser: 1.39 mg/dL — ABNORMAL HIGH (ref 0.44–1.00)
GFR, Estimated: 42 mL/min — ABNORMAL LOW (ref >60)
Glucose, Bld: 112 mg/dL — ABNORMAL HIGH (ref 70–99)
Potassium: 3.7 mmol/L (ref 3.5–5.1)
Sodium: 134 mmol/L — ABNORMAL LOW (ref 135–145)

## 2023-06-03 LAB — FERRITIN: Ferritin: 44 ng/mL (ref 11–307)

## 2023-06-03 LAB — CBC
HCT: 33.9 % — ABNORMAL LOW (ref 36.0–46.0)
Hemoglobin: 10.4 g/dL — ABNORMAL LOW (ref 12.0–15.0)
MCH: 25.8 pg — ABNORMAL LOW (ref 26.0–34.0)
MCHC: 30.7 g/dL (ref 30.0–36.0)
MCV: 84.1 fL (ref 80.0–100.0)
Platelets: 404 10*3/uL — ABNORMAL HIGH (ref 150–400)
RBC: 4.03 MIL/uL (ref 3.87–5.11)
RDW: 16.3 % — ABNORMAL HIGH (ref 11.5–15.5)
WBC: 9.8 10*3/uL (ref 4.0–10.5)
nRBC: 0 % (ref 0.0–0.2)

## 2023-06-03 LAB — IRON AND TIBC
Iron: 25 ug/dL — ABNORMAL LOW (ref 28–170)
Saturation Ratios: 8 % — ABNORMAL LOW (ref 10.4–31.8)
TIBC: 332 ug/dL (ref 250–450)
UIBC: 307 ug/dL

## 2023-06-05 ENCOUNTER — Other Ambulatory Visit: Payer: Self-pay | Admitting: Medical Oncology

## 2023-06-10 ENCOUNTER — Inpatient Hospital Stay: Payer: 59 | Attending: Nurse Practitioner

## 2023-06-10 VITALS — BP 119/74 | HR 80 | Temp 98.2°F

## 2023-06-10 DIAGNOSIS — D509 Iron deficiency anemia, unspecified: Secondary | ICD-10-CM | POA: Diagnosis present

## 2023-06-10 DIAGNOSIS — D500 Iron deficiency anemia secondary to blood loss (chronic): Secondary | ICD-10-CM

## 2023-06-10 MED ORDER — SODIUM CHLORIDE 0.9 % IV SOLN
200.0000 mg | INTRAVENOUS | Status: DC
  Administered 2023-06-10: 200 mg via INTRAVENOUS
  Filled 2023-06-10: qty 200

## 2023-06-10 MED ORDER — SODIUM CHLORIDE 0.9 % IV SOLN
Freq: Once | INTRAVENOUS | Status: AC
  Filled 2023-06-10: qty 250

## 2023-06-15 ENCOUNTER — Inpatient Hospital Stay: Payer: 59

## 2023-06-16 ENCOUNTER — Inpatient Hospital Stay: Payer: 59

## 2023-06-16 MED FILL — Iron Sucrose Inj 20 MG/ML (Fe Equiv): INTRAVENOUS | Qty: 10 | Status: AC

## 2023-06-17 ENCOUNTER — Inpatient Hospital Stay: Payer: 59

## 2023-06-17 VITALS — BP 124/77 | HR 71 | Temp 97.5°F | Resp 18

## 2023-06-17 DIAGNOSIS — D509 Iron deficiency anemia, unspecified: Secondary | ICD-10-CM | POA: Diagnosis not present

## 2023-06-17 DIAGNOSIS — D500 Iron deficiency anemia secondary to blood loss (chronic): Secondary | ICD-10-CM

## 2023-06-17 MED ORDER — SODIUM CHLORIDE 0.9 % IV SOLN
200.0000 mg | INTRAVENOUS | Status: DC
  Administered 2023-06-17: 200 mg via INTRAVENOUS
  Filled 2023-06-17: qty 200

## 2023-06-17 MED ORDER — SODIUM CHLORIDE 0.9 % IV SOLN
Freq: Once | INTRAVENOUS | Status: AC
  Filled 2023-06-17: qty 250

## 2023-06-17 NOTE — Patient Instructions (Signed)
[facility]  Discharge Instructions: Thank you for choosing [facility] to provide your oncology and hematology care.  If you have a lab appointment with the Cancer Center, please go directly to the Cancer Center and check in at the registration area.  Wear comfortable clothing and clothing appropriate for easy access to any Portacath or PICC line.   We strive to give you quality time with your provider. You may need to reschedule your appointment if you arrive late (15 or more minutes).  Arriving late affects you and other patients whose appointments are after yours.  Also, if you miss three or more appointments without notifying the office, you may be dismissed from the clinic at the provider's discretion.      For prescription refill requests, have your pharmacy contact our office and allow 72 hours for refills to be completed.    Today you received the following chemotherapy and/or immunotherapy agents venofer      To help prevent nausea and vomiting after your treatment, we encourage you to take your nausea medication as directed.  BELOW ARE SYMPTOMS THAT SHOULD BE REPORTED IMMEDIATELY: *FEVER GREATER THAN 100.4 F (38 C) OR HIGHER *CHILLS OR SWEATING *NAUSEA AND VOMITING THAT IS NOT CONTROLLED WITH YOUR NAUSEA MEDICATION *UNUSUAL SHORTNESS OF BREATH *UNUSUAL BRUISING OR BLEEDING *URINARY PROBLEMS (pain or burning when urinating, or frequent urination) *BOWEL PROBLEMS (unusual diarrhea, constipation, pain near the anus) TENDERNESS IN MOUTH AND THROAT WITH OR WITHOUT PRESENCE OF ULCERS (sore throat, sores in mouth, or a toothache) UNUSUAL RASH, SWELLING OR PAIN  UNUSUAL VAGINAL DISCHARGE OR ITCHING   Items with * indicate a potential emergency and should be followed up as soon as possible or go to the Emergency Department if any problems should occur.  Please show the CHEMOTHERAPY ALERT CARD or IMMUNOTHERAPY ALERT CARD at check-in to  the Emergency Department and triage nurse.  Should you have questions after your visit or need to cancel or reschedule your appointment, please contact [facility]  [phone] and follow the prompts.  Office hours are 8:00 a.m. to 4:30 p.m. Monday - Friday. Please note that voicemails left after 4:00 p.m. may not be returned until the following business day.  We are closed weekends and major holidays. You have access to a nurse at all times for urgent questions. Please call the main number to the clinic [phone] and follow the prompts.  For any non-urgent questions, you may also contact your provider using MyChart. We now offer e-Visits for anyone 18 and older to request care online for non-urgent symptoms. For details visit [email or web address].   Also download the MyChart app! Go to the app store, search "MyChart", open the app, select [facility], and log in with your MyChart username and password.    

## 2023-06-18 ENCOUNTER — Inpatient Hospital Stay: Payer: 59

## 2023-06-24 ENCOUNTER — Emergency Department
Admission: EM | Admit: 2023-06-24 | Discharge: 2023-06-24 | Disposition: A | Discharge status: Home / Self Care | Payer: 59 | Attending: Emergency Medicine | Admitting: Emergency Medicine

## 2023-06-24 ENCOUNTER — Emergency Department: Payer: 59

## 2023-06-24 ENCOUNTER — Inpatient Hospital Stay: Payer: 59

## 2023-06-24 ENCOUNTER — Encounter: Payer: Self-pay | Admitting: Emergency Medicine

## 2023-06-24 ENCOUNTER — Other Ambulatory Visit: Payer: Self-pay

## 2023-06-24 DIAGNOSIS — R9431 Abnormal electrocardiogram [ECG] [EKG]: Secondary | ICD-10-CM | POA: Diagnosis not present

## 2023-06-24 DIAGNOSIS — S42292A Other displaced fracture of upper end of left humerus, initial encounter for closed fracture: Secondary | ICD-10-CM | POA: Diagnosis not present

## 2023-06-24 DIAGNOSIS — R4182 Altered mental status, unspecified: Secondary | ICD-10-CM | POA: Insufficient documentation

## 2023-06-24 DIAGNOSIS — I509 Heart failure, unspecified: Secondary | ICD-10-CM | POA: Insufficient documentation

## 2023-06-24 DIAGNOSIS — N390 Urinary tract infection, site not specified: Secondary | ICD-10-CM | POA: Diagnosis not present

## 2023-06-24 DIAGNOSIS — W19XXXA Unspecified fall, initial encounter: Secondary | ICD-10-CM | POA: Diagnosis not present

## 2023-06-24 DIAGNOSIS — I2510 Atherosclerotic heart disease of native coronary artery without angina pectoris: Secondary | ICD-10-CM | POA: Insufficient documentation

## 2023-06-24 DIAGNOSIS — S4992XA Unspecified injury of left shoulder and upper arm, initial encounter: Secondary | ICD-10-CM | POA: Diagnosis present

## 2023-06-24 DIAGNOSIS — J449 Chronic obstructive pulmonary disease, unspecified: Secondary | ICD-10-CM | POA: Insufficient documentation

## 2023-06-24 DIAGNOSIS — I110 Hypertensive heart disease with heart failure: Secondary | ICD-10-CM | POA: Diagnosis not present

## 2023-06-24 LAB — COMPREHENSIVE METABOLIC PANEL
ALT: 17 U/L (ref 0–44)
AST: 30 U/L (ref 15–41)
Albumin: 3.2 g/dL — ABNORMAL LOW (ref 3.5–5.0)
Alkaline Phosphatase: 96 U/L (ref 38–126)
Anion gap: 6 (ref 5–15)
BUN: 13 mg/dL (ref 8–23)
CO2: 20 mmol/L — ABNORMAL LOW (ref 22–32)
Calcium: 8.6 mg/dL — ABNORMAL LOW (ref 8.9–10.3)
Chloride: 105 mmol/L (ref 98–111)
Creatinine, Ser: 1.21 mg/dL — ABNORMAL HIGH (ref 0.44–1.00)
GFR, Estimated: 49 mL/min — ABNORMAL LOW (ref >60)
Glucose, Bld: 87 mg/dL (ref 70–99)
Potassium: 5 mmol/L (ref 3.5–5.1)
Sodium: 131 mmol/L — ABNORMAL LOW (ref 135–145)
Total Bilirubin: 0.6 mg/dL (ref 0.3–1.2)
Total Protein: 7.1 g/dL (ref 6.5–8.1)

## 2023-06-24 LAB — CBC WITH DIFFERENTIAL/PLATELET
Abs Immature Granulocytes: 0.04 10*3/uL (ref 0.00–0.07)
Basophils Absolute: 0 10*3/uL (ref 0.0–0.1)
Basophils Relative: 0 %
Eosinophils Absolute: 0.1 10*3/uL (ref 0.0–0.5)
Eosinophils Relative: 1 %
HCT: 34.9 % — ABNORMAL LOW (ref 36.0–46.0)
Hemoglobin: 10.7 g/dL — ABNORMAL LOW (ref 12.0–15.0)
Immature Granulocytes: 0 %
Lymphocytes Relative: 13 %
Lymphs Abs: 1.4 10*3/uL (ref 0.7–4.0)
MCH: 25.7 pg — ABNORMAL LOW (ref 26.0–34.0)
MCHC: 30.7 g/dL (ref 30.0–36.0)
MCV: 83.7 fL (ref 80.0–100.0)
Monocytes Absolute: 0.4 10*3/uL (ref 0.1–1.0)
Monocytes Relative: 4 %
Neutro Abs: 8.5 10*3/uL — ABNORMAL HIGH (ref 1.7–7.7)
Neutrophils Relative %: 82 %
Platelets: 343 10*3/uL (ref 150–400)
RBC: 4.17 MIL/uL (ref 3.87–5.11)
RDW: 17.7 % — ABNORMAL HIGH (ref 11.5–15.5)
WBC: 10.5 10*3/uL (ref 4.0–10.5)
nRBC: 0 % (ref 0.0–0.2)

## 2023-06-24 LAB — URINALYSIS, ROUTINE W REFLEX MICROSCOPIC
Bilirubin Urine: NEGATIVE
Glucose, UA: NEGATIVE mg/dL
Hgb urine dipstick: NEGATIVE
Ketones, ur: NEGATIVE mg/dL
Nitrite: POSITIVE — AB
Protein, ur: NEGATIVE mg/dL
Specific Gravity, Urine: 1.006 (ref 1.005–1.030)
pH: 5 (ref 5.0–8.0)

## 2023-06-24 LAB — CK: Total CK: 70 U/L (ref 38–234)

## 2023-06-24 LAB — TROPONIN I (HIGH SENSITIVITY): Troponin I (High Sensitivity): 9 ng/L (ref <18)

## 2023-06-24 MED ORDER — OXYCODONE-ACETAMINOPHEN 5-325 MG PO TABS: 1.0000 | ORAL_TABLET | ORAL | 0 refills | Status: DC | PRN

## 2023-06-24 MED ORDER — CEPHALEXIN 500 MG PO CAPS: 500.0000 mg | ORAL_CAPSULE | Freq: Three times a day (TID) | ORAL | 0 refills | Status: DC

## 2023-06-24 MED ORDER — OXYCODONE-ACETAMINOPHEN 5-325 MG PO TABS
1.0000 | ORAL_TABLET | Freq: Once | ORAL | Status: AC
  Administered 2023-06-24: 1 via ORAL
  Filled 2023-06-24: qty 1

## 2023-06-24 MED ORDER — SODIUM CHLORIDE 0.9 % IV SOLN
1.0000 g | Freq: Once | INTRAVENOUS | Status: AC
  Administered 2023-06-24: 1 g via INTRAVENOUS
  Filled 2023-06-24: qty 10

## 2023-06-24 MED ORDER — FENTANYL CITRATE PF 50 MCG/ML IJ SOSY
50.0000 ug | PREFILLED_SYRINGE | Freq: Once | INTRAMUSCULAR | Status: AC
  Administered 2023-06-24: 50 ug via INTRAVENOUS
  Filled 2023-06-24: qty 1

## 2023-06-24 MED ORDER — ONDANSETRON HCL 4 MG/2ML IJ SOLN
4.0000 mg | Freq: Once | INTRAMUSCULAR | Status: AC
  Administered 2023-06-24: 4 mg via INTRAVENOUS
  Filled 2023-06-24: qty 2

## 2023-06-24 NOTE — ED Notes (Signed)
Pt's L shoulder deformed; pt reports pain at site; pt trying to brace it with her R arm; pt assisted to restroom as requested. Pt's skin dry, resp reg/unlabored and pt is calm.

## 2023-06-24 NOTE — ED Notes (Signed)
Requested pain medication for patient at this time to ED provider

## 2023-06-24 NOTE — ED Notes (Signed)
Pt away at imaging but to be brought back to room 54 when done.

## 2023-06-24 NOTE — ED Provider Notes (Signed)
[facility] Provider Note    Event Date/Time   First MD Initiated Contact with Patient 06/24/23 1306     (approximate)   History   Fall   HPI  MADYN FULLEN is a 67 y.o. female presenting to the emergency department with history of bilateral carotid artery stenosis, CHF, COPD, CAD, GERD, hypertension, MI, PAD, pulmonary hypertension and as listed in EMR.  Patient states that she tripped over her shoes and hit the wall with her left shoulder.  She denies striking her head or loss of consciousness. She states that she is on a blood thinner. She denies pain other than in the left shoulder.     Physical Exam   Triage Vital Signs: ED Triage Vitals  Encounter Vitals Group     BP 06/24/23 1255 (!) 174/83     Systolic BP Percentile --      Diastolic BP Percentile --      Pulse Rate 06/24/23 1255 72     Resp 06/24/23 1255 18     Temp 06/24/23 1255 97.8 F (36.6 C)     Temp Source 06/24/23 1255 Oral     SpO2 06/24/23 1255 98 %     Weight 06/24/23 1256 140 lb (63.5 kg)     Height 06/24/23 1256 5\' 7"  (1.702 m)     Head Circumference --      Peak Flow --      Pain Score 06/24/23 1256 10     Pain Loc --      Pain Education --      Exclude from Growth Chart --     Most recent vital signs: Vitals:   06/24/23 1639 06/24/23 1918  BP: 137/80 (!) 167/83  Pulse: 69 68  Resp: 17 15  Temp:  97.6 F (36.4 C)  SpO2: 97% 100%    General: Awake, no distress. Alert to person and place CV:  Good peripheral perfusion. Left radial pulse 2+ Resp:  Normal effort. Breath sounds are clear. Abd:  No distention. Soft. No tenderness to palpation. Other:  No reported tenderness to palpation along the length of the spine.  No pain with palpation of either hip.  Patient able to demonstrate flexion and extension of the lower extremities without complaint of pain.   ED Results / Procedures / Treatments   Labs (all labs ordered are listed, but only abnormal results  are displayed) Labs Reviewed  URINE CULTURE - Abnormal; Notable for the following components:      Result Value   Culture   (*)    Value: >=100,000 COLONIES/mL KLEBSIELLA ORNITHINOLYTICA SUSCEPTIBILITIES TO FOLLOW Performed at [facility], [address] [place], [place], [place] [address]    All other components within normal limits  COMPREHENSIVE METABOLIC PANEL - Abnormal; Notable for the following components:   Sodium 131 (*)    CO2 20 (*)    Creatinine, Ser 1.21 (*)    Calcium 8.6 (*)    Albumin 3.2 (*)    GFR, Estimated 49 (*)    All other components within normal limits  URINALYSIS, ROUTINE W REFLEX MICROSCOPIC - Abnormal; Notable for the following components:   Color, Urine YELLOW (*)    APPearance HAZY (*)    Nitrite POSITIVE (*)    Leukocytes,Ua MODERATE (*)    Bacteria, UA MANY (*)    All other components within normal limits  CBC WITH DIFFERENTIAL/PLATELET - Abnormal; Notable for the following components:   Hemoglobin 10.7 (*)  HCT 34.9 (*)    MCH 25.7 (*)    RDW 17.7 (*)    Neutro Abs 8.5 (*)    All other components within normal limits  CK  TROPONIN I (HIGH SENSITIVITY)     EKG  Sinus rhythm with a rate of 66. New T wave inversions in V2 through V6.    RADIOLOGY  Image and radiology report reviewed and interpreted by me. Radiology report consistent with the same.  Comminuted, displaced fracture of the humeral neck of the left upper extremity.  PROCEDURES:  Critical Care performed: No  Procedures   MEDICATIONS ORDERED IN ED:  Medications  fentaNYL (SUBLIMAZE) injection 50 mcg (50 mcg Intravenous Given 06/24/23 1422)  ondansetron (ZOFRAN) injection 4 mg (4 mg Intravenous Given 06/24/23 1421)  fentaNYL (SUBLIMAZE) injection 50 mcg (50 mcg Intravenous Given 06/24/23 1540)  cefTRIAXone (ROCEPHIN) 1 g in sodium chloride 0.9 % 100 mL IVPB (0 g Intravenous Stopped 06/24/23 2005)  oxyCODONE-acetaminophen (PERCOCET/ROXICET) 5-325 MG per tablet 1 tablet  (1 tablet Oral Given 06/24/23 1902)     IMPRESSION / MDM / ASSESSMENT AND PLAN / ED COURSE   I have reviewed the triage note.  Differential diagnosis includes, but is not limited to, altered mental status, head injury, shoulder dislocation, humeral head fracture.  Patient's presentation is most consistent with acute presentation with potential threat to life or bodily function.  67 year old female presenting to the emergency department for treatment and evaluation after falling at home earlier today.  See HPI for further details.  Patient states that she was scheduled to have iron infusion today in the cancer center, but because she fell and had pain in her arm she decided to come to the ER. She also reports falling 3 weeks ago and hit her head on the hard floor. Afterward, she opened her eyes and it seemed that the refrigerator and cabinet had been knocked over. She closed her eyes for a few minutes and when she opened them, everything was where it was supposed to be.   I called to speak with her grandson, Clifton Custard, who advises that she is at her cognitive baseline. He lives with her, but did not see her fall today. He states that she has a walker, but won't use it. He confirms her reported fall 3 weeks ago and repeated practically verbatim the way she described to me today.   Patient states that she is taking a blood thinner, however I do not see it in her medication list.  Either way, she will have a CT of her head and cervical spine to ensure no acute concerns are found.  Will we will also get some labs and a urinalysis.  Patient is requesting pain medication and "not just some Tylenol."  Medication ordered.  New T wave inversions on her EKG in V2 through V6.  Patient has denied chest pain or shortness of breath.  She does continue to have significant pain in the left arm secondary to humerus fracture.  Additional medications ordered. Troponin added to labs.  Troponin is normal. Urinalysis and  CT head and cervical spine results pending. Care relinquished to K. Paduchowski, MD who will follow up on results with likely plan to discharge home to follow up with cardiology for EKG change and orthopedics for humerus fracture.     FINAL CLINICAL IMPRESSION(S) / ED DIAGNOSES   Final diagnoses:  Other closed displaced fracture of proximal end of left humerus, initial encounter  EKG abnormality  Lower urinary tract  infectious disease     Rx / DC Orders   ED Discharge Orders          Ordered    oxyCODONE-acetaminophen (PERCOCET) 5-325 MG tablet  Every 4 hours PRN        06/24/23 1832    cephALEXin (KEFLEX) 500 MG capsule  3 times daily        06/24/23 1832             Note:  This document was prepared using Dragon voice recognition software and may include unintentional dictation errors.   Chinita Pester, FNP 06/25/23 1542    Minna Antis, MD 06/25/23 1925

## 2023-06-24 NOTE — ED Notes (Signed)
Pt assisted back to room and onto stretcher. Rail up, stretcher locked low. Pt's L arm elevated on 2 pillows. Pt given warm blanket. Monitor applied with pulse ox attached to finger on injured arm.

## 2023-06-24 NOTE — ED Provider Notes (Signed)
-----------------------------------------   6:31 PM on 06/24/2023 ----------------------------------------- Patient care assumed from prior provider.  CT scan of the head has come back looking normal.  Urinalysis shows urinary tract infection.  Will dose IV Rocephin and discharged on Keflex.  Patient to follow-up with orthopedics regarding her humerus fracture.  Patient agreeable to plan.   Minna Antis, MD 06/24/23 [phone]

## 2023-06-24 NOTE — Discharge Instructions (Addendum)
Please call the number provided for orthopedics to arrange a follow-up appointment as soon as possible.  Please take your pain medication as needed but only as prescribed.  Please complete your entire course of antibiotics as written.  Please wear your sling.  Return to the emergency department for any symptom concerning to yourself.

## 2023-06-24 NOTE — ED Notes (Signed)
EKG to EDP Paduchowski in person.  

## 2023-06-24 NOTE — ED Triage Notes (Signed)
Patient to ED via POV after a fall. Patient states she tripped over her shoes and her left shoulder hit the wall. Denies LOC or hitting head.

## 2023-06-24 NOTE — ED Notes (Signed)
Discharge instructions provided by edp were discussed with pt. Pt verbalized understanding with no additional questions at this time. Called pt's grandson Clifton Custard per pt request who is agreeable to pick up pt.

## 2023-06-24 NOTE — ED Notes (Signed)
Notified provider CBT in person that pt reports nearly unchanged pain level so far from fentanyl. No new orders at this time.

## 2023-06-26 LAB — URINE CULTURE: Culture: >=100000 — AB

## 2023-06-29 NOTE — Consult Note (Signed)
ED Antimicrobial Stewardship Positive Culture Follow Up   Traci Duran is an 67 y.o. female who presented to [facility] on 06/24/2023 with a chief complaint of fall. She had no complaints of urinary symptoms on presentation. She was discharged on Keflex for UTI after review of UA taken in the ED.  Urine cultures now positive for Klebsiella orinthinolytica. Culture report does not show sensitivities for cefazolin, so Keflex sensitivity cannot be assumed. Per discussion with patient on 7/22, she is having some urinary hesitancy. She denies other urinary symptoms. Her primary concern was pain management in relation to her recent fall. Patient was directed to follow up with PCP or return to ED for pain management if pain is unbearable. Patient made aware that the new prescription would be sent to patient's preferred pharmacy.   Chief Complaint  Patient presents with   Fall    Recent Results (from the past 720 hour(s))  Urine Culture     Status: Abnormal   Collection Time: 06/24/23  2:24 PM   Specimen: Urine, Clean Catch  Result Value Ref Range Status   Specimen Description   Final    URINE, CLEAN CATCH Performed at [facility], [address], [place], [place] [address]    Special Requests   Final    NONE Performed at [facility], [address] [place], [place] [address]    Culture >=100,000 COLONIES/mL KLEBSIELLA ORNITHINOLYTICA (A)  Final   Report Status 06/26/2023 FINAL  Final   Organism ID, Bacteria KLEBSIELLA ORNITHINOLYTICA (A)  Final      Susceptibility   Klebsiella ornithinolytica - MIC*    AMPICILLIN RESISTANT Resistant     CEFEPIME <=0.12 SENSITIVE Sensitive     CEFTRIAXONE <=0.25 SENSITIVE Sensitive     CIPROFLOXACIN <=0.25 SENSITIVE Sensitive     GENTAMICIN <=1 SENSITIVE Sensitive     IMIPENEM 1 SENSITIVE Sensitive     NITROFURANTOIN 32 SENSITIVE Sensitive     TRIMETH/SULFA <=20 SENSITIVE Sensitive     AMPICILLIN/SULBACTAM <=2 SENSITIVE  Sensitive     PIP/TAZO <=4 SENSITIVE Sensitive     * >=100,000 COLONIES/mL KLEBSIELLA ORNITHINOLYTICA    [x]  Treated with Keflex, cannot confirm organism sensitivity to prescribed antimicrobial []  Patient discharged originally without antimicrobial agent and treatment is now indicated  New antibiotic prescription: Macrobid 100 mg BID x5d  ED Provider: Pilar Jarvis, MD  Celene Squibb, PharmD Clinical Pharmacist 06/29/2023 1:23 PM

## 2023-07-30 ENCOUNTER — Encounter: Payer: Self-pay | Admitting: Oncology

## 2023-07-30 ENCOUNTER — Other Ambulatory Visit: Payer: Self-pay | Admitting: Student

## 2023-07-30 DIAGNOSIS — S42292A Other displaced fracture of upper end of left humerus, initial encounter for closed fracture: Secondary | ICD-10-CM

## 2023-07-31 ENCOUNTER — Inpatient Hospital Stay: Payer: 59 | Admitting: Oncology

## 2023-07-31 ENCOUNTER — Inpatient Hospital Stay: Payer: 59

## 2023-08-04 ENCOUNTER — Emergency Department
Admission: EM | Admit: 2023-08-04 | Discharge: 2023-08-04 | Disposition: A | Discharge status: Home / Self Care | Payer: 59 | Attending: Emergency Medicine | Admitting: Emergency Medicine

## 2023-08-04 ENCOUNTER — Other Ambulatory Visit: Payer: Self-pay

## 2023-08-04 ENCOUNTER — Ambulatory Visit
Admission: RE | Admit: 2023-08-04 | Discharge: 2023-08-04 | Disposition: A | Discharge status: Home / Self Care | Payer: 59 | Source: Ambulatory Visit | Attending: Student | Admitting: Student

## 2023-08-04 DIAGNOSIS — K625 Hemorrhage of anus and rectum: Secondary | ICD-10-CM

## 2023-08-04 DIAGNOSIS — S42292A Other displaced fracture of upper end of left humerus, initial encounter for closed fracture: Secondary | ICD-10-CM

## 2023-08-04 DIAGNOSIS — W19XXXA Unspecified fall, initial encounter: Secondary | ICD-10-CM | POA: Diagnosis not present

## 2023-08-04 DIAGNOSIS — J449 Chronic obstructive pulmonary disease, unspecified: Secondary | ICD-10-CM | POA: Insufficient documentation

## 2023-08-04 DIAGNOSIS — M25512 Pain in left shoulder: Secondary | ICD-10-CM

## 2023-08-04 DIAGNOSIS — I509 Heart failure, unspecified: Secondary | ICD-10-CM | POA: Insufficient documentation

## 2023-08-04 DIAGNOSIS — K649 Unspecified hemorrhoids: Secondary | ICD-10-CM

## 2023-08-04 LAB — CBC
HCT: 36.6 % (ref 36.0–46.0)
Hemoglobin: 11.4 g/dL — ABNORMAL LOW (ref 12.0–15.0)
MCH: 27.4 pg (ref 26.0–34.0)
MCHC: 31.1 g/dL (ref 30.0–36.0)
MCV: 88 fL (ref 80.0–100.0)
Platelets: 262 10*3/uL (ref 150–400)
RBC: 4.16 MIL/uL (ref 3.87–5.11)
RDW: 19.3 % — ABNORMAL HIGH (ref 11.5–15.5)
WBC: 6.6 10*3/uL (ref 4.0–10.5)
nRBC: 0 % (ref 0.0–0.2)

## 2023-08-04 LAB — BASIC METABOLIC PANEL
Anion gap: 7 (ref 5–15)
BUN: 13 mg/dL (ref 8–23)
CO2: 22 mmol/L (ref 22–32)
Calcium: 8.6 mg/dL — ABNORMAL LOW (ref 8.9–10.3)
Chloride: 103 mmol/L (ref 98–111)
Creatinine, Ser: 1.21 mg/dL — ABNORMAL HIGH (ref 0.44–1.00)
GFR, Estimated: 49 mL/min — ABNORMAL LOW (ref >60)
Glucose, Bld: 127 mg/dL — ABNORMAL HIGH (ref 70–99)
Potassium: 3.7 mmol/L (ref 3.5–5.1)
Sodium: 132 mmol/L — ABNORMAL LOW (ref 135–145)

## 2023-08-04 MED ORDER — OXYCODONE HCL 5 MG PO TABS
5.0000 mg | ORAL_TABLET | Freq: Once | ORAL | Status: AC
  Administered 2023-08-04: 5 mg via ORAL
  Filled 2023-08-04: qty 1

## 2023-08-04 NOTE — Discharge Instructions (Signed)
You were seen in the emergency department today for your shoulder pain and rectal bleeding.  Please continue to follow-up with orthopedics for your shoulder pain.  For your rectal bleeding, if you begin to notice more bleeding than just a few drops when you wipe, please return to the ER for further evaluation.  Otherwise, I recommend starting a bowel regimen to try and prevent straining.  Have included more information about this below.  Please also return to the ER for any other new or concerning symptoms.   To help prevent constipation in the future please: Eat a high-fiber diet Drink water and other fluids during the day Go to the bathroom at regular times every day  To treat your constipation:  Take a capful of MiraLAX 1-2 times daily You can also try over-the-counter stool softener such as Dulcolax You can use over-the-counter medications such as senna, but only use this for a few days, do not use this for prolonged period If you are still having constipation, you can try an over-the-counter suppository or enema

## 2023-08-04 NOTE — ED Provider Notes (Signed)
[facility] Provider Note    Event Date/Time   First MD Initiated Contact with Patient 08/04/23 1606     (approximate)   History   Rectal Bleeding   HPI  Traci Duran is a 67 y.o. female with history of anemia, COPD, CHF presenting to the emergency department for evaluation of shoulder pain and rectal bleeding. Patient was seen in our ER on 7/17 after a fall.  At that time she was found to have a humerus fracture for which she has been following with orthopedics.  She had an MRI earlier today but reports that she has been having ongoing pain not improved with Tylenol.  No new injuries.  In addition, patient reports that 2 days ago she noticed a small amount of bright red blood after wiping after a bowel movement. Has had a couple more episodes of noting bright red blood when she wipes since then.  No maroon, black stool noted.  Does have hemorrhoids and thinks that it may be coming from this.  Denies abdominal pain.  No vomiting or diarrhea.  Has noted some increased straining in the setting of her recent pain medication use.  No fevers or chills.  No chest pain or shortness of breath.      Physical Exam   Triage Vital Signs: ED Triage Vitals  Encounter Vitals Group     BP 08/04/23 1308 (!) 158/108     Systolic BP Percentile --      Diastolic BP Percentile --      Pulse Rate 08/04/23 1308 70     Resp 08/04/23 1308 18     Temp 08/04/23 1308 98.8 F (37.1 C)     Temp Source 08/04/23 1700 Oral     SpO2 08/04/23 1308 99 %     Weight 08/04/23 1308 138 lb 14.2 oz (63 kg)     Height 08/04/23 1308 5\' 7"  (1.702 m)     Head Circumference --      Peak Flow --      Pain Score 08/04/23 1308 6     Pain Loc --      Pain Education --      Exclude from Growth Chart --     Most recent vital signs: Vitals:   08/04/23 1630 08/04/23 1700  BP: (!) 192/130 (!) 194/115  Pulse: 65 66  Resp:  17  Temp:  98.6 F (37 C)  SpO2: 98% 98%     General: Awake,  interactive  CV:  Regular rate, good peripheral perfusion.  Resp:  Lungs clear, unlabored respirations.  Abd:  Soft, nondistended, nontender to palpation diffusely, brown stool on rectal exam with a small speck of bright red blood, Hemoccult positive, multiple hemorrhoids noted without visible active bleeding of the external hemorrhoids or thrombosis Neuro:  Symmetric facial movement, fluid speech MSK:  Tenderness palpation of the left humerus consistent with location of patient's known fracture.  2+ radial pulses, sensation intact throughout the extremity.   ED Results / Procedures / Treatments   Labs (all labs ordered are listed, but only abnormal results are displayed) Labs Reviewed  CBC - Abnormal; Notable for the following components:      Result Value   Hemoglobin 11.4 (*)    RDW 19.3 (*)    All other components within normal limits  BASIC METABOLIC PANEL - Abnormal; Notable for the following components:   Sodium 132 (*)    Glucose, Bld 127 (*)    Creatinine,  Ser 1.21 (*)    Calcium 8.6 (*)    GFR, Estimated 49 (*)    All other components within normal limits     EKG EKG independently reviewed interpreted by myself (ER attending) demonstrates:    RADIOLOGY Imaging independently reviewed and interpreted by myself demonstrates:    PROCEDURES:  Critical Care performed: No  Procedures   MEDICATIONS ORDERED IN ED: Medications  oxyCODONE (Oxy IR/ROXICODONE) immediate release tablet 5 mg (5 mg Oral Given 08/04/23 1714)     IMPRESSION / MDM / ASSESSMENT AND PLAN / ED COURSE  I reviewed the triage vital signs and the nursing notes.  Differential diagnosis includes, but is not limited to, rectal bleeding secondary hemorrhoids, consideration for other lower GI bleeding sources including AVM, upper GI bleeding though lower suspicion based on history, shoulder pain likely secondary to known fracture, no recent trauma or evidence of neurovascular compromise on  exam  Patient's presentation is most consistent with acute presentation with potential threat to life or bodily function.  67 year old female presenting to the emergency department for evaluation of shoulder pain and rectal bleeding.  Regarding her shoulder pain, likely related to known fracture.  Had an MRI earlier today, do not think there is an indication for further imaging or workup here.    For her rectal bleeding, does have a history of prior GI bleeds, but hemoglobin today is actually improved from her priors at 11.4.  She does have hemorrhoids on exam with reported straining.  I suspect that her small amount of bright red bleeding is likely related to this.  With her history of prior GI bleeding, I did discuss the possibility of admission for further workup, but patient is comfortable with plan for discharge home with strict return precautions which I do think is reasonable.  Patient discharged in stable condition.      FINAL CLINICAL IMPRESSION(S) / ED DIAGNOSES   Final diagnoses:  Bright red blood per rectum  Hemorrhoids, unspecified hemorrhoid type  Left shoulder pain, unspecified chronicity     Rx / DC Orders   ED Discharge Orders     None        Note:  This document was prepared using Dragon voice recognition software and may include unintentional dictation errors.   Trinna Post, MD 08/05/23 [phone]

## 2023-08-04 NOTE — ED Triage Notes (Signed)
Pt to ED for rectal bleeding for the past 2 days. Also c/o left shoulder pain from previous break.

## 2023-08-05 ENCOUNTER — Encounter: Payer: Self-pay | Admitting: Oncology

## 2023-08-05 ENCOUNTER — Inpatient Hospital Stay: Payer: 59 | Attending: Nurse Practitioner

## 2023-08-05 ENCOUNTER — Inpatient Hospital Stay (HOSPITAL_BASED_OUTPATIENT_CLINIC_OR_DEPARTMENT_OTHER): Payer: 59 | Admitting: Oncology

## 2023-08-05 VITALS — BP 140/67 | HR 73 | Temp 94.6°F | Resp 18 | Ht 67.0 in | Wt 140.8 lb

## 2023-08-05 DIAGNOSIS — D519 Vitamin B12 deficiency anemia, unspecified: Secondary | ICD-10-CM | POA: Insufficient documentation

## 2023-08-05 DIAGNOSIS — D509 Iron deficiency anemia, unspecified: Secondary | ICD-10-CM | POA: Diagnosis present

## 2023-08-05 DIAGNOSIS — Z79899 Other long term (current) drug therapy: Secondary | ICD-10-CM | POA: Insufficient documentation

## 2023-08-05 DIAGNOSIS — D500 Iron deficiency anemia secondary to blood loss (chronic): Secondary | ICD-10-CM

## 2023-08-05 LAB — CBC
HCT: 37.2 % (ref 36.0–46.0)
Hemoglobin: 11.7 g/dL — ABNORMAL LOW (ref 12.0–15.0)
MCH: 27.7 pg (ref 26.0–34.0)
MCHC: 31.5 g/dL (ref 30.0–36.0)
MCV: 87.9 fL (ref 80.0–100.0)
Platelets: 208 10*3/uL (ref 150–400)
RBC: 4.23 MIL/uL (ref 3.87–5.11)
RDW: 19.8 % — ABNORMAL HIGH (ref 11.5–15.5)
WBC: 5.7 10*3/uL (ref 4.0–10.5)
nRBC: 0 % (ref 0.0–0.2)

## 2023-08-05 LAB — IRON AND TIBC
Iron: 59 ug/dL (ref 28–170)
Saturation Ratios: 22 % (ref 10.4–31.8)
TIBC: 270 ug/dL (ref 250–450)
UIBC: 211 ug/dL

## 2023-08-05 LAB — FERRITIN: Ferritin: 89 ng/mL (ref 11–307)

## 2023-08-06 ENCOUNTER — Encounter: Payer: Self-pay | Admitting: Oncology

## 2023-08-06 NOTE — Progress Notes (Signed)
Hematology/Oncology Consult note [facility]  Telephone:([phone] [phone]  Patient Care Team: [name] as PCP - General Creig Hines, MD as Consulting Physician (Oncology)   Name of the patient: Traci Duran  [id number]  1956-01-26   Date of visit: 08/06/23  Diagnosis- iron and B12 deficiency anemia    Chief complaint/ Reason for visit- routine f/u of anemia  Heme/Onc history:  Patient is a 67 year old female referred for iron deficiency anemia.  Most recent CBC from 07/16/2020 showed white count of 7.9, H&H of 7.2/25.6 with an MCV of 74.9 and a platelet count of 280.  Ferritin levels were low at 19 and iron studies showed low iron saturation of 4%.  B12 was low at 187.  Folate was normal.  She also underwent small bowel endoscopy for obscure GI bleeding which showed no evidence of any pathology in proximal jejunum medium sized hiatal hernia she has previously undergone EGD and colonoscopy in October and November 2020 as well she is here for consideration of IV iron.Patient had a repeat EGD and colonoscopy in November 2023.  EGD showed normal stomach and duodenal bulb.  Reflux esophagitis with no bleeding.  Colonoscopy showed nonbleeding external hemorrhoids.  2 polyps in the transverse colon which were resected and was negative for malignancy.   Patient has tolerated IV iron well in the past  Interval history-patient recently had of fallIn July when she hurt her left shoulder.  She was found to have comminuted displaced fracture of the left humerus.  She is following up with orthopedics and continues to report significant pain in that area  ECOG PS- 2 Pain scale- 7 Opioid associated constipation- no  Review of systems- Review of Systems  Constitutional:  Positive for malaise/fatigue. Negative for chills, fever and weight loss.  HENT:  Negative for congestion, ear discharge and nosebleeds.   Eyes:  Negative for blurred  vision.  Respiratory:  Negative for cough, hemoptysis, sputum production, shortness of breath and wheezing.   Cardiovascular:  Negative for chest pain, palpitations, orthopnea and claudication.  Gastrointestinal:  Negative for abdominal pain, blood in stool, constipation, diarrhea, heartburn, melena, nausea and vomiting.  Genitourinary:  Negative for dysuria, flank pain, frequency, hematuria and urgency.  Musculoskeletal:  Positive for joint pain. Negative for back pain and myalgias.       Left shoulder pain  Skin:  Negative for rash.  Neurological:  Negative for dizziness, tingling, focal weakness, seizures, weakness and headaches.  Endo/Heme/Allergies:  Does not bruise/bleed easily.  Psychiatric/Behavioral:  Negative for depression and suicidal ideas. The patient does not have insomnia.       Allergies  Allergen Reactions   Chantix [Varenicline] Hives, Shortness Of Breath and Palpitations   Diphenhydramine Hcl Shortness Of Breath   Benadryl [Diphenhydramine Hcl] Rash   Niacin Rash   Trazodone Palpitations     Past Medical History:  Diagnosis Date   Anemia 05/2020   Thought to be related to GI bleed.   Anxiety    Bilateral carotid artery stenosis without cerebral infarction 10/2010    November 2011, CTA of the head and neck: Near complete occlusion versus severe stenosis of nearly the entire basilar artery, complete occlusion of the right vertebral artery from its origin off the right SCA, moderate noncalcified atherosclerotic in the proximal left SCA which results in a 40% stenosis. Right ICA with approximately 15% stenosis, left   Nov. 2012 Carotid Duplex: >75% diameter re   CHF (congestive heart  failure), chronic HFrEF NYHA class II, chronic, diastolic (HCC)    Chronic HFrEF   COPD (chronic obstructive pulmonary disease) (HCC)    Coronary artery disease, non-occlusive 10/2009   Cardiac Cath: EF 85%;  40% prox &mid RCA -> 80% RPDA and AMrg, 60% RPAV.  40% prox & distal LAD w.  60% dLAD and D2, - Med Rx; Myoview 9/19: Small size, Mod severity Apical Anterior defect  c/w ISCHEMIA. -> INTERMEDIATE RISK (med Rx because of anemia, and known distal LAD and PDA disease. ->  Similar defect noted in August 2009)   Depression    Gastritis    GERD (gastroesophageal reflux disease)    Hemorrhoids    Hyperlipidemia    Hypertension    Mild aortic stenosis by prior echocardiogram 06/2020   Mild aortic stenosis by echo   Moderate to severe mitral regurgitation 06/2020   Echo at [facility]: Moderate-severe MR with mild RV enlargement and severely elevated PA 95 mmHg.   Myocardial infarction [facility])    Demand ischemia infarction initially in 2010   PAD (peripheral artery disease) (HCC) 07/2008   a) 8/'09: normal ABIs; b) post procedure Dopplers December 2010: Suggestion of left PFA/CFA-SFV AV fistula.; c) 02/2010: Left CFA-PFA AV fistula still present.; d) 04/2010: CTA Abd/Pelvis- LE Runoff: Extensive Mixed plaque in the Abd Ao.  Patent visceral As.  Bilat 2V runnoff-feet ATA & Peroneal A;  (confirmed by Arteriogram 03/2011); e) ABIs October 2017 R ABI 0.88, L ABI 0.79.   Pulmonary hypertension (HCC) 06/2020   [facility] echo shows moderate-severe MR, moderate to severe TR with RVP estimated 95 mmHg.   Stenosis of left subclavian artery (HCC) 10/27/2010   ~75% by recent doppler -- CHECK BP ON R ARM (or bilaterally for confirmation)     Past Surgical History:  Procedure Laterality Date   APPENDECTOMY     COLONOSCOPY WITH PROPOFOL N/A 06/09/2017   Procedure: COLONOSCOPY WITH PROPOFOL;  Surgeon: Midge Minium, MD;  Location: [facility] ENDOSCOPY;  Service: Endoscopy;  Laterality: N/A;   COLONOSCOPY WITH PROPOFOL N/A 10/08/2019   Procedure: COLONOSCOPY WITH PROPOFOL;  Surgeon: Midge Minium, MD;  Location: [facility] ENDOSCOPY;  Service: Endoscopy;  Laterality: N/A;   COLONOSCOPY WITH PROPOFOL N/A 10/12/2022   Procedure: COLONOSCOPY WITH PROPOFOL;  Surgeon: Toney Reil, MD;  Location: [facility] ENDOSCOPY;   Service: Gastroenterology;  Laterality: N/A;   ENTEROSCOPY N/A 10/08/2019   Procedure: ENTEROSCOPY;  Surgeon: Midge Minium, MD;  Location: [facility] ENDOSCOPY;  Service: Endoscopy;  Laterality: N/A;   ENTEROSCOPY N/A 07/18/2020   Procedure: ENTEROSCOPY;  Surgeon: Toney Reil, MD;  Location: [facility] ENDOSCOPY;  Service: Gastroenterology;  Laterality: N/A;   ENTEROSCOPY N/A 04/07/2023   Procedure: ENTEROSCOPY;  Surgeon: Toney Reil, MD;  Location: [facility] ENDOSCOPY;  Service: Gastroenterology;  Laterality: N/A;  push   ESOPHAGOGASTRODUODENOSCOPY N/A 07/30/2017   Procedure: ESOPHAGOGASTRODUODENOSCOPY (EGD);  Surgeon: Toney Reil, MD;  Location: [facility] ENDOSCOPY;  Service: Gastroenterology;  Laterality: N/A;   ESOPHAGOGASTRODUODENOSCOPY (EGD) WITH PROPOFOL N/A 04/09/2017   Procedure: ESOPHAGOGASTRODUODENOSCOPY (EGD) WITH PROPOFOL;  Surgeon: Midge Minium, MD;  Location: [facility] ENDOSCOPY;  Service: Endoscopy;  Laterality: N/A;   ESOPHAGOGASTRODUODENOSCOPY (EGD) WITH PROPOFOL N/A 10/12/2022   Procedure: ESOPHAGOGASTRODUODENOSCOPY (EGD) WITH PROPOFOL;  Surgeon: Toney Reil, MD;  Location: [facility] ENDOSCOPY;  Service: Gastroenterology;  Laterality: N/A;   GIVENS CAPSULE STUDY N/A 10/08/2019   Procedure: GIVENS CAPSULE STUDY;  Surgeon: Midge Minium, MD;  Location: [facility] ENDOSCOPY;  Service: Endoscopy;  Laterality: N/A;   HIP FRACTURE SURGERY  LEFT HEART CATH AND CORONARY ANGIOGRAPHY  November 17, 2009   [facility]: EF 85%;  40% prox &mid RCA -> 80% RPDA and AMrg, 60% RPAV.  40% prox & distal LAD w. 60% dLAD and D2, - Med Rx   LOWER EXTREMITY ARTERIOGRAM Bilateral 03/28/2011   ([facility]) RLE: moderate focal prox. R> CIA stenosis, patent PFA, SFA, CFA, 2 vessel runoff via PT/peroneal, AT proximally occluded. LLE: moderate CFA stenosis, patent PFA, SFA and PA, 2 vessel runoff via AT/peroneal, PT occluded.   NM GATED MYOVIEW ([facility] HX)  08/2018   Small sized mild severity defect in the apical anterior wall  concerning for ischemia.  INTERMEDIATE RISK. (Appears to be no change from 2008/11/17)   NM MYOVIEW LTD  07/2008   ([facility]): A) 07/2008: Minimal reversible defect in the apex with possible ischemia.  Likely represents artifact.  EF 59% -> Cath: 80% PDA, 60% PAV and distal LAD dZ); B) 08/2010: Lexiscan - > ischemia the Ant & Inf Apex.  EF 64%.(Med Rx); C) Jan 2013: + Apical Ischemia - unchanged; D) Jan 2016: EF 76%. No Ischemia /Infarct.   TRANSTHORACIC ECHOCARDIOGRAM  11/2009   [facility]) A) December 2010, 2D echo: EF >55%, mild concentric LVH, diastolic dysfunction Gr. I, LVIDd 4.2 cm, LVIDs 2.3 cm, mild TR (peak RVP 28 mmHg).; B) 2011/12/112D echo: EF >55%, mild concentric LVH, diastolic dysfunction Gr. I, LVIDd 4.3 cm, LVIDs 1.7 cm, mild PR (peak RVP 25 mmHg).; C) 12/2014 Echo: EF >55%, mild LVH, DD gr. I, LAE 3.9cm, Moderate TR (PRVP 35 mmHg)   TRANSTHORACIC ECHOCARDIOGRAM  06/13/2020   [facility]: EF 60-65%.  Normal LV function.  Indeterminate diastolic pressures.  Mild RV enlargement with severely elevated PAP estimated 95. moderate biatrial dilation.  Moderate to severe MR.  Moderate to severe TR.  Mild AS.    Social History   Socioeconomic History   Marital status: Widowed    Spouse name: Not on file   Number of children: 3   Years of education: Not on file   Highest education level: Not on file  Occupational History   Not on file  Tobacco Use   Smoking status: Every Day    Current packs/day: 1.00    Average packs/day: 1 pack/day for 46.0 years (46.0 ttl pk-yrs)    Types: Cigarettes   Smokeless tobacco: Never   Tobacco comments:    0.5PPD 02/17/2022  Vaping Use   Vaping status: Never Used  Substance and Sexual Activity   Alcohol use: No   Drug use: Yes    Types: Marijuana    Comment: occasional   Sexual activity: Not Currently  Other Topics Concern   Not on file  Social History Narrative   She is a widowed mother of at least 3 daughters.  Unfortunately  all 3 daughters have deceased.        2015: Her youngest daughter and son-in-law were run over by an [place] train   Nov 17, 2017: Middle daughter died of complications of DKA   2021-04-17: Oldest daughter died cancer after being on palliative care.   She also had a dog that died after being with her for 18 years.   Social Determinants of Health   Financial Resource Strain: Low Risk  (06/03/2022)   Received from [facility], [facility]   Overall Financial Resource Strain (CARDIA)    Difficulty of Paying Living Expenses: Not hard at all  Food Insecurity: No Food Insecurity (10/10/2022)  Hunger Vital Sign    Worried About Running Out of Food in the Last Year: Never true    Ran Out of Food in the Last Year: Never true  Transportation Needs: No Transportation Needs (10/10/2022)   PRAPARE - Administrator, Civil Service (Medical): No    Lack of Transportation (Non-Medical): No  Physical Activity: Inactive (11/09/2020)   Received from [facility], [facility]   Exercise Vital Sign    Days of Exercise per Week: 0 days    Minutes of Exercise per Session: 0 min  Stress: Stress Concern Present (11/09/2020)   Received from [facility], [facility]   [employer] of Occupational Health - Occupational Stress Questionnaire    Feeling of Stress : Rather much  Social Connections: Socially Isolated (11/09/2020)   Received from [facility], [facility]   Social Connection and Isolation Panel [NHANES]    Frequency of Communication with Friends and Family: Three times a week    Frequency of Social Gatherings with Friends and Family: Never    Attends Religious Services: Never    Database administrator or Organizations: No    Attends Banker Meetings: Never    Marital Status: Widowed  Intimate Partner Violence: Not At Risk (10/10/2022)    Humiliation, Afraid, Rape, and Kick questionnaire    Fear of Current or Ex-Partner: No    Emotionally Abused: No    Physically Abused: No    Sexually Abused: No    Family History  Problem Relation Age of Onset   Heart failure Mother    Heart attack Mother        mother died at 20 of an MI   Heart attack Father        father died at 36 of an MI   Heart attack Brother        Brother had an MI in his 77s     Current Outpatient Medications:    albuterol (PROVENTIL) (2.5 MG/3ML) 0.083% nebulizer solution, Take 3 mLs (2.5 mg total) by nebulization every 6 (six) hours as needed for wheezing or shortness of breath., Disp: 75 mL, Rfl: 0   alprazolam (XANAX) 2 MG tablet, Take 2 mg by mouth 2 (two) times daily. , Disp: , Rfl:    atorvastatin (LIPITOR) 80 MG tablet, Take 80 mg by mouth daily., Disp: , Rfl:    busPIRone (BUSPAR) 10 MG tablet, Take 10 mg by mouth 2 (two) times daily. , Disp: , Rfl: 2   carvedilol (COREG) 6.25 MG tablet, Take 6.25 mg by mouth 2 (two) times daily., Disp: , Rfl:    cephALEXin (KEFLEX) 500 MG capsule, Take 1 capsule (500 mg total) by mouth 3 (three) times daily., Disp: 30 capsule, Rfl: 0   citalopram (CELEXA) 40 MG tablet, Take 40 mg by mouth daily., Disp: , Rfl:    DULoxetine (CYMBALTA) 60 MG capsule, Take 60 mg by mouth daily., Disp: , Rfl:    ezetimibe (ZETIA) 10 MG tablet, Take 1 tablet (10 mg total) by mouth daily., Disp: 90 tablet, Rfl: 3   ferrous gluconate (FERGON) 324 MG tablet, Take 1 tablet (324 mg total) by mouth daily with breakfast., Disp: 30 tablet, Rfl: 1   furosemide (LASIX) 20 MG tablet, Take 1 tablet (20 mg total) by mouth daily., Disp: 90 tablet, Rfl: 1   gabapentin (NEURONTIN) 400 MG capsule, Take 400 mg by mouth 3 (three)  times daily., Disp: , Rfl:    ipratropium-albuterol (DUONEB) 0.5-2.5 (3) MG/3ML SOLN, Take 3 mLs by nebulization every 6 (six) hours as needed., Disp: 360 mL, Rfl: 0   isosorbide mononitrate (IMDUR) 30 MG 24 hr tablet, Take 1  tablet (30 mg total) by mouth daily., Disp: 30 tablet, Rfl: 6   lisinopril (ZESTRIL) 10 MG tablet, TAKE ONE TABLET BY MOUTH EVERY DAY, Disp: 90 tablet, Rfl: 3   naproxen (NAPROSYN) 500 MG tablet, Take 1 tablet (500 mg total) by mouth 2 (two) times daily with a meal., Disp: 20 tablet, Rfl: 2   nitroGLYCERIN (NITROSTAT) 0.4 MG SL tablet, Place 0.4 mg under the tongue as needed., Disp: , Rfl:    oxyCODONE (OXY IR/ROXICODONE) 5 MG immediate release tablet, Take 1-2 tablets (5-10 mg total) by mouth every 4 (four) hours as needed for moderate pain or severe pain., Disp: 15 tablet, Rfl: 0   oxyCODONE-acetaminophen (PERCOCET) 5-325 MG tablet, Take 1 tablet by mouth every 4 (four) hours as needed for severe pain., Disp: 20 tablet, Rfl: 0   pantoprazole (PROTONIX) 40 MG tablet, Take 1 tablet (40 mg total) by mouth daily., Disp: 30 tablet, Rfl: 1   SYMBICORT 80-4.5 MCG/ACT inhaler, Inhale 2 puffs into the lungs 2 (two) times daily., Disp: , Rfl:    Vitamin D, Ergocalciferol, (DRISDOL) 1.25 MG (50000 UNIT) CAPS capsule, Take by mouth., Disp: , Rfl:  No current facility-administered medications for this visit.  Facility-Administered Medications Ordered in Other Visits:    alteplase (CATHFLO ACTIVASE) injection 2 mg, 2 mg, Intracatheter, Once PRN, Creig Hines, MD   heparin lock flush 100 unit/mL, 500 Units, Intracatheter, Once PRN, Creig Hines, MD   heparin lock flush 100 unit/mL, 250 Units, Intracatheter, Once PRN, Creig Hines, MD   sodium chloride flush (NS) 0.9 % injection 10 mL, 10 mL, Intracatheter, Once PRN, Creig Hines, MD   sodium chloride flush (NS) 0.9 % injection 3 mL, 3 mL, Intracatheter, Once PRN, Creig Hines, MD  Physical exam:  Vitals:   08/05/23 1540  BP: (!) 140/67  Pulse: 73  Resp: 18  Temp: (!) 94.6 F (34.8 C)  TempSrc: Tympanic  SpO2: 99%  Weight: 140 lb 12.8 oz (63.9 kg)  Height: 5\' 7"  (1.702 m)   Physical Exam Constitutional:      Comments: Sitting in a  wheelchair. Appears in no acute distress  Cardiovascular:     Rate and Rhythm: Normal rate and regular rhythm.     Heart sounds: Normal heart sounds.  Pulmonary:     Effort: Pulmonary effort is normal.     Breath sounds: Normal breath sounds.  Abdominal:     General: Bowel sounds are normal.     Palpations: Abdomen is soft.  Skin:    General: Skin is warm and dry.  Neurological:     Mental Status: She is alert and oriented to person, place, and time.        Latest Ref Rng & Units 08/04/2023    1:10 PM  CMP  Glucose 70 - 99 mg/dL 161   BUN 8 - 23 mg/dL 13   Creatinine 0.96 - 1.00 mg/dL 0.45   Sodium 409 - 811 mmol/L 132   Potassium 3.5 - 5.1 mmol/L 3.7   Chloride 98 - 111 mmol/L 103   CO2 22 - 32 mmol/L 22   Calcium 8.9 - 10.3 mg/dL 8.6       Latest Ref Rng & Units 08/05/2023  3:05 PM  CBC  WBC 4.0 - 10.5 K/uL 5.7   Hemoglobin 12.0 - 15.0 g/dL 36.6   Hematocrit 44.0 - 46.0 % 37.2   Platelets 150 - 400 K/uL 208     No images are attached to the encounter.  No results found.   Assessment and plan- Patient is a 67 y.o. female here for routine follow-up of Iron B12 deficiency anemia  Patient's hemoglobin is presently at 11.7 and near normal.  Ferritin levels are normal at 89 with normal iron studies.  She does not require any IV iron at this time.  I will repeat her CBC ferritin and iron studies in 2 4 and 6 months and see her back in 6 months  Left shoulder pain: Secondary to recent humerus fracture.  Patient reports having significant pain and would like pain medications.  Have asked her to discuss this further with orthopedics and her primary care   Visit Diagnosis 1. Iron deficiency anemia due to chronic blood loss   2. Iron deficiency anemia, unspecified iron deficiency anemia type      Dr. Owens Shark, MD, MPH [facility] [phone] 08/06/2023 10:55 AM

## 2023-08-20 ENCOUNTER — Telehealth: Payer: Self-pay | Admitting: Cardiology

## 2023-08-20 NOTE — Telephone Encounter (Signed)
*  STAT* If patient is at the pharmacy, call can be transferred to refill team.   1. Which medications need to be refilled? (please list name of each medication and dose if known)   lisinopril (ZESTRIL) 10 MG tablet    2. Which pharmacy/location (including street and city if local pharmacy) is medication to be sent to?  [facility] [place] [address]      3. Do they need a 30 day or 90 day supply? 90 Day

## 2023-08-20 NOTE — Telephone Encounter (Signed)
Please contact pt for future appointment. Pt due for 12 month f/u. 

## 2023-08-21 ENCOUNTER — Other Ambulatory Visit: Payer: Self-pay

## 2023-08-21 NOTE — Telephone Encounter (Signed)
Please contact patient for a follow up appointment with Dr. Azucena Cecil.  The patient has a recall for September.

## 2023-08-24 MED ORDER — LISINOPRIL 10 MG PO TABS: 10.0000 mg | ORAL_TABLET | Freq: Every day | ORAL | 1 refills | Status: DC

## 2023-08-24 NOTE — Telephone Encounter (Signed)
Scheduled 10/18

## 2023-09-23 ENCOUNTER — Emergency Department: Payer: 59

## 2023-09-23 ENCOUNTER — Other Ambulatory Visit: Payer: Self-pay

## 2023-09-23 ENCOUNTER — Emergency Department
Admission: EM | Admit: 2023-09-23 | Discharge: 2023-09-23 | Disposition: A | Discharge status: Home / Self Care | Payer: 59 | Attending: Emergency Medicine | Admitting: Emergency Medicine

## 2023-09-23 DIAGNOSIS — I110 Hypertensive heart disease with heart failure: Secondary | ICD-10-CM | POA: Diagnosis not present

## 2023-09-23 DIAGNOSIS — I2510 Atherosclerotic heart disease of native coronary artery without angina pectoris: Secondary | ICD-10-CM | POA: Diagnosis not present

## 2023-09-23 DIAGNOSIS — I5030 Unspecified diastolic (congestive) heart failure: Secondary | ICD-10-CM | POA: Insufficient documentation

## 2023-09-23 DIAGNOSIS — J449 Chronic obstructive pulmonary disease, unspecified: Secondary | ICD-10-CM | POA: Diagnosis not present

## 2023-09-23 DIAGNOSIS — M25522 Pain in left elbow: Secondary | ICD-10-CM | POA: Diagnosis present

## 2023-09-23 DIAGNOSIS — S52032A Displaced fracture of olecranon process with intraarticular extension of left ulna, initial encounter for closed fracture: Secondary | ICD-10-CM | POA: Diagnosis not present

## 2023-09-23 DIAGNOSIS — S52022A Displaced fracture of olecranon process without intraarticular extension of left ulna, initial encounter for closed fracture: Secondary | ICD-10-CM

## 2023-09-23 DIAGNOSIS — W228XXA Striking against or struck by other objects, initial encounter: Secondary | ICD-10-CM | POA: Diagnosis not present

## 2023-09-23 MED ORDER — ONDANSETRON 4 MG PO TBDP
4.0000 mg | ORAL_TABLET | Freq: Once | ORAL | Status: AC
  Administered 2023-09-23: 4 mg via ORAL
  Filled 2023-09-23: qty 1

## 2023-09-23 MED ORDER — OXYCODONE HCL 5 MG PO TABS
5.0000 mg | ORAL_TABLET | Freq: Once | ORAL | Status: AC
  Administered 2023-09-23: 5 mg via ORAL
  Filled 2023-09-23: qty 1

## 2023-09-23 MED ORDER — OXYCODONE HCL 5 MG PO TABS
5.0000 mg | ORAL_TABLET | ORAL | 0 refills | Status: DC | PRN
Start: 2023-09-23 — End: 2023-09-28

## 2023-09-23 NOTE — ED Provider Notes (Signed)
[facility] Provider Note    Event Date/Time   First MD Initiated Contact with Patient 09/23/23 1312     (approximate)   History   Fall   HPI  Traci Duran is a 67 y.o. female with a past medical history of depression, benzodiazepine abuse recent proximal humerus fracture, who presents today with a another left upper extremity injury.  Patient reports that she tripped over her cat and hit her arm against a wall.  She denies head strike or LOC.  She reports that she has pain to her elbow and her shoulder.  She reports that she has pain with movement.  No numbness or tingling.  She denies taking anticoagulation.  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   Trochanteric fracture of right femur (HCC) 11/13/2019   Occult GI bleeding    Symptomatic anemia 10/06/2019   Chronic heart failure with preserved ejection fraction (HFpEF) (HCC) 05/16/2019   Rectal bleed 11/19/2018   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   Benzodiazepine abuse (HCC) 09/03/2018   Moderate recurrent major depression (HCC) 09/03/2018   Hyponatremia 10/11/2017   Chest pain 07/28/2017   Acute blood loss anemia 07/28/2017   Demand ischemia (HCC)    Benign neoplasm of descending colon    Polyp of sigmoid colon    Age related osteoporosis 04/16/2017   Iron deficiency anemia secondary to blood loss (chronic)    GI bleed 04/06/2017   Diastolic dysfunction 09/30/2016   Opioid contract exists 06/27/2016   Incidental lung nodule  01/18/2016   Traumatic hemorrhage of cerebrum (HCC) 12/04/2015   Hx of traumatic brain injury 12/04/2015   Hypovitaminosis D 08/29/2015   Thyroid lesion 08/29/2015   Major depressive disorder, single episode, unspecified 08/27/2015   Basal ganglia hemorrhage (HCC) 08/25/2015   Gastroesophageal reflux disease 01/26/2015   Osteopenia 01/26/2015   Anemia, unspecified 12/27/2014   History of fall 12/27/2014   Stenosis of left subclavian artery (HCC) 09/26/2014   Greater tuberosity of humerus fracture 10/24/2013   AVF (arteriovenous fistula) (HCC) 11/08/2011   Anxiety 10/24/2011   Cerebrovascular disease 10/24/2011   Dyslipidemia 10/24/2011   Essential hypertension - check pressures on R arm 10/24/2011   Tobacco abuse 10/24/2011   Palpitations 10/24/2011   Renal artery stenosis (HCC) 10/24/2011   Unstable angina (HCC) 10/14/2011   Hyperlipidemia LDL goal <70    Chronic obstructive pulmonary disease (HCC)    Depression    PAD (peripheral artery disease) (HCC)    Coronary artery disease, non-occlusive    Bilateral carotid artery stenosis without cerebral infarction 10/2010          Physical Exam   Triage Vital Signs: ED Triage Vitals  Encounter Vitals Group     BP 09/23/23 1300 114/86     Systolic BP Percentile --      Diastolic BP Percentile --      Pulse Rate 09/23/23 1300 86     Resp 09/23/23 1300 16     Temp 09/23/23 1300 98.1 F (36.7 C)  Temp Source 09/23/23 1300 Oral     SpO2 09/23/23 1300 98 %     Weight 09/23/23 1301 140 lb (63.5 kg)     Height 09/23/23 1301 5\' 7"  (1.702 m)     Head Circumference --      Peak Flow --      Pain Score 09/23/23 1300 10     Pain Loc --      Pain Education --      Exclude from Growth Chart --     Most recent vital signs: Vitals:   09/23/23 1300  BP: 114/86  Pulse: 86  Resp: 16  Temp: 98.1 F (36.7 C)  SpO2: 98%    Physical Exam Vitals and nursing note reviewed.  Constitutional:      General: Awake and alert. No  acute distress.    Appearance: Normal appearance. The patient is normal weight.  HENT:     Head: Normocephalic and atraumatic.     Mouth: Mucous membranes are moist.  Eyes:     General: PERRL. Normal EOMs        Right eye: No discharge.        Left eye: No discharge.     Conjunctiva/sclera: Conjunctivae normal.  Cardiovascular:     Rate and Rhythm: Normal rate and regular rhythm.     Pulses: Normal pulses.  Pulmonary:     Effort: Pulmonary effort is normal. No respiratory distress.     Breath sounds: Normal breath sounds.  Abdominal:     Abdomen is soft. There is no abdominal tenderness. No rebound or guarding. No distention. Musculoskeletal:        General: No swelling. Normal range of motion.     Cervical back: Normal range of motion and neck supple.  Left upper extremity: Swelling noted to the elbow extending to the mid forearm with tenderness to palpation.  Sensation is intact to light touch.  Compartments are soft and compressible.  She is able to extend her elbow to 160 degrees.  She has unable to lift her shoulder past 30 degrees forward flexion and 30 degrees abduction secondary to pain.  Normal radial pulse.  Normal intrinsic muscle function of the hand.  Superficial abrasion over the olecranon. Skin:    General: Skin is warm and dry.     Capillary Refill: Capillary refill takes less than 2 seconds.     Findings: No rash.  Neurological:     Mental Status: The patient is awake and alert.      ED Results / Procedures / Treatments   Labs (all labs ordered are listed, but only abnormal results are displayed) Labs Reviewed - No data to display   EKG     RADIOLOGY I independently reviewed and interpreted imaging and agree with radiologists findings.     PROCEDURES:  Critical Care performed:   Procedures   MEDICATIONS ORDERED IN ED: Medications  ondansetron (ZOFRAN-ODT) disintegrating tablet 4 mg (4 mg Oral Given 09/23/23 1309)  oxyCODONE (Oxy  IR/ROXICODONE) immediate release tablet 5 mg (5 mg Oral Given 09/23/23 1320)     IMPRESSION / MDM / ASSESSMENT AND PLAN / ED COURSE  I reviewed the triage vital signs and the nursing notes.   Differential diagnosis includes, but is not limited to, fracture, dislocation, hematoma, contusion.  I reviewed the patient's chart.  Patient follows with the [facility] clinic, Dr. Ernest Pine for a recent displaced fracture of the proximal end of the left humerus and resultant compression fracture of the left  glenoid without displacement.  She was managed nonoperative with PT and OT.  Patient is awake and alert, hemodynamically stable and afebrile.  She is nontoxic in appearance.  She has obvious swelling to her left upper extremity.  Compartments are soft and compressible throughout, normal radial pulse, normal grip strength, sensation intact, do not suspect compartment syndrome at this time.  X-rays were ordered in triage.  X-rays are revealing for a displaced olecranon fracture.  I originally discussed this with [facility] orthopedics on-call, Dr. Joice Lofts, however he reports that Dr. Ernest Pine does not do these surgeries and he is unable to put the surgery into his schedule and recommended I speak with orthopedics on-call, Dr. Steward Drone.  Subsequently discussed with Dr. Steward Drone who reports that he is able to see the patient in his clinic tomorrow.  He agrees with posterior long-arm splint.  Recommended rest, ice, elevation.  Patient understands where to go tomorrow, the address was written down for her.  She understands return precautions in the meantime.  She requested a prescription for pain medication, her creatinine is slightly out of the normal range, therefore I am reluctant to prescribe her NSAIDs.  Discussed with her that oxycodone can increase her fall risk, however she reports that she has someone at home who can help her manage this.  She reports that she has taken it in the past and feels comfortable with this  medication.  She was reminded that this medication is highly addictive and should only be used for breakthrough pain.  She was also advised that she cannot drive, operate heavy machinery, or perform any test that require concentration while taking this medication.  She understands and agrees.  She was discharged in stable condition.   Patient's presentation is most consistent with acute presentation with potential threat to life or bodily function.   Clinical Course as of 09/23/23 1502  Wed Sep 23, 2023  1352 Orthopedics paged [JP]  1421 Discussed with Gavin Potters first given that she is followed by Dr. Ernest Pine, however Dr. Ernest Pine does not do this type of injury and it was advised that I speak with Dr. Steward Drone.  I subsequently discussed with Dr. Steward Drone who agrees with posterior long-arm splint and he will see the patient tomorrow [JP]    Clinical Course User Index [JP] Hamlet Lasecki, Herb Grays, PA-C     FINAL CLINICAL IMPRESSION(S) / ED DIAGNOSES   Final diagnoses:  Closed fracture of olecranon process of left ulna, initial encounter     Rx / DC Orders   ED Discharge Orders          Ordered    oxyCODONE (ROXICODONE) 5 MG immediate release tablet  Every 4 hours PRN        09/23/23 1454             Note:  This document was prepared using Dragon voice recognition software and may include unintentional dictation errors.   Keturah Shavers 09/23/23 1502    Minna Antis, MD 09/24/23 [phone]

## 2023-09-23 NOTE — Discharge Instructions (Signed)
Please go to [address] tomorrow to see Dr. Steward Drone with orthopedics.  Keep your splint clean and dry.  Keep your arm elevated to help with the swelling.  Please return for any new, worsening, or change in symptoms or other concerns.  It was a pleasure caring for you today.

## 2023-09-23 NOTE — ED Triage Notes (Signed)
Pt fell yesterday and thinks her left shoulder is broken. Thought it was just sprained so delayed coming. Upper arm to elbow swollen. Broke left shoulder a couple months ago.

## 2023-09-24 ENCOUNTER — Other Ambulatory Visit (HOSPITAL_BASED_OUTPATIENT_CLINIC_OR_DEPARTMENT_OTHER): Payer: Self-pay | Admitting: Orthopaedic Surgery

## 2023-09-24 ENCOUNTER — Ambulatory Visit: Payer: 59

## 2023-09-24 ENCOUNTER — Ambulatory Visit (HOSPITAL_BASED_OUTPATIENT_CLINIC_OR_DEPARTMENT_OTHER): Payer: Self-pay | Admitting: Orthopaedic Surgery

## 2023-09-24 DIAGNOSIS — S52022A Displaced fracture of olecranon process without intraarticular extension of left ulna, initial encounter for closed fracture: Secondary | ICD-10-CM | POA: Diagnosis not present

## 2023-09-24 DIAGNOSIS — S52021A Displaced fracture of olecranon process without intraarticular extension of right ulna, initial encounter for closed fracture: Secondary | ICD-10-CM

## 2023-09-24 MED ORDER — OXYCODONE-ACETAMINOPHEN 5-325 MG PO TABS: 1.0000 | ORAL_TABLET | ORAL | 0 refills | Status: DC | PRN

## 2023-09-24 NOTE — Progress Notes (Signed)
Chief Complaint: Left elbow fracture     History of Present Illness:    Traci Duran is a 67 y.o. female presents the left lower condyle fracture after a fall the day prior.  She states that she is currently being treated for possible humerus fracture at [facility] manage estimated to the point where she is able to overhead lift.  She subsequently was going to feed her cat and had a fall directly onto the side.  She is here today for further discussion of her olecranon fracture.  She was placed in a posterior slab splint.    Surgical History:   None.  PMH/PSH/Family History/Social History/Meds/Allergies:    Past Medical History:  Diagnosis Date   Anemia 05/2020   Thought to be related to GI bleed.   Anxiety    Bilateral carotid artery stenosis without cerebral infarction 10/2010    November 2011, CTA of the head and neck: Near complete occlusion versus severe stenosis of nearly the entire basilar artery, complete occlusion of the right vertebral artery from its origin off the right SCA, moderate noncalcified atherosclerotic in the proximal left SCA which results in a 40% stenosis. Right ICA with approximately 15% stenosis, left   Nov. 2012 Carotid Duplex: >75% diameter re   CHF (congestive heart failure), chronic HFrEF NYHA class II, chronic, diastolic (HCC)    Chronic HFrEF   COPD (chronic obstructive pulmonary disease) (HCC)    Coronary artery disease, non-occlusive 10/2009   Cardiac Cath: EF 85%;  40% prox &mid RCA -> 80% RPDA and AMrg, 60% RPAV.  40% prox & distal LAD w. 60% dLAD and D2, - Med Rx; Myoview 9/19: Small size, Mod severity Apical Anterior defect  c/w ISCHEMIA. -> INTERMEDIATE RISK (med Rx because of anemia, and known distal LAD and PDA disease. ->  Similar defect noted in August 2009)   Depression    Gastritis    GERD (gastroesophageal reflux disease)    Hemorrhoids    Hyperlipidemia    Hypertension    Mild aortic stenosis  by prior echocardiogram 06/2020   Mild aortic stenosis by echo   Moderate to severe mitral regurgitation 06/2020   Echo at [facility]: Moderate-severe MR with mild RV enlargement and severely elevated PA 95 mmHg.   Myocardial infarction [facility])    Demand ischemia infarction initially in 2010   PAD (peripheral artery disease) (HCC) 07/2008   a) 8/'09: normal ABIs; b) post procedure Dopplers December 2010: Suggestion of left PFA/CFA-SFV AV fistula.; c) 02/2010: Left CFA-PFA AV fistula still present.; d) 04/2010: CTA Abd/Pelvis- LE Runoff: Extensive Mixed plaque in the Abd Ao.  Patent visceral As.  Bilat 2V runnoff-feet ATA & Peroneal A;  (confirmed by Arteriogram 03/2011); e) ABIs October 2017 R ABI 0.88, L ABI 0.79.   Pulmonary hypertension (HCC) 06/2020   [facility] echo shows moderate-severe MR, moderate to severe TR with RVP estimated 95 mmHg.   Stenosis of left subclavian artery (HCC) 10/27/2010   ~75% by recent doppler -- CHECK BP ON R ARM (or bilaterally for confirmation)   Past Surgical History:  Procedure Laterality Date   APPENDECTOMY     COLONOSCOPY WITH PROPOFOL N/A 06/09/2017   Procedure: COLONOSCOPY WITH PROPOFOL;  Surgeon: Midge Minium, MD;  Location: [facility] ENDOSCOPY;  Service: Endoscopy;  Laterality: N/A;   COLONOSCOPY WITH PROPOFOL  N/A 10/08/2019   Procedure: COLONOSCOPY WITH PROPOFOL;  Surgeon: Midge Minium, MD;  Location: [facility] ENDOSCOPY;  Service: Endoscopy;  Laterality: N/A;   COLONOSCOPY WITH PROPOFOL N/A 10/12/2022   Procedure: COLONOSCOPY WITH PROPOFOL;  Surgeon: Toney Reil, MD;  Location: [facility] ENDOSCOPY;  Service: Gastroenterology;  Laterality: N/A;   ENTEROSCOPY N/A 10/08/2019   Procedure: ENTEROSCOPY;  Surgeon: Midge Minium, MD;  Location: [facility] ENDOSCOPY;  Service: Endoscopy;  Laterality: N/A;   ENTEROSCOPY N/A 07/18/2020   Procedure: ENTEROSCOPY;  Surgeon: Toney Reil, MD;  Location: [facility] ENDOSCOPY;  Service: Gastroenterology;  Laterality: N/A;   ENTEROSCOPY N/A  04/07/2023   Procedure: ENTEROSCOPY;  Surgeon: Toney Reil, MD;  Location: [facility] ENDOSCOPY;  Service: Gastroenterology;  Laterality: N/A;  push   ESOPHAGOGASTRODUODENOSCOPY N/A 07/30/2017   Procedure: ESOPHAGOGASTRODUODENOSCOPY (EGD);  Surgeon: Toney Reil, MD;  Location: [facility] ENDOSCOPY;  Service: Gastroenterology;  Laterality: N/A;   ESOPHAGOGASTRODUODENOSCOPY (EGD) WITH PROPOFOL N/A 04/09/2017   Procedure: ESOPHAGOGASTRODUODENOSCOPY (EGD) WITH PROPOFOL;  Surgeon: Midge Minium, MD;  Location: [facility] ENDOSCOPY;  Service: Endoscopy;  Laterality: N/A;   ESOPHAGOGASTRODUODENOSCOPY (EGD) WITH PROPOFOL N/A 10/12/2022   Procedure: ESOPHAGOGASTRODUODENOSCOPY (EGD) WITH PROPOFOL;  Surgeon: Toney Reil, MD;  Location: [facility] ENDOSCOPY;  Service: Gastroenterology;  Laterality: N/A;   GIVENS CAPSULE STUDY N/A 10/08/2019   Procedure: GIVENS CAPSULE STUDY;  Surgeon: Midge Minium, MD;  Location: [facility] ENDOSCOPY;  Service: Endoscopy;  Laterality: N/A;   HIP FRACTURE SURGERY     LEFT HEART CATH AND CORONARY ANGIOGRAPHY  10/2009   [facility]: EF 85%;  40% prox &mid RCA -> 80% RPDA and AMrg, 60% RPAV.  40% prox & distal LAD w. 60% dLAD and D2, - Med Rx   LOWER EXTREMITY ARTERIOGRAM Bilateral 03/28/2011   ([facility]) RLE: moderate focal prox. R> CIA stenosis, patent PFA, SFA, CFA, 2 vessel runoff via PT/peroneal, AT proximally occluded. LLE: moderate CFA stenosis, patent PFA, SFA and PA, 2 vessel runoff via AT/peroneal, PT occluded.   NM GATED MYOVIEW ([facility] HX)  08/2018   Small sized mild severity defect in the apical anterior wall concerning for ischemia.  INTERMEDIATE RISK. (Appears to be no change from 2009)   NM MYOVIEW LTD  07/2008   ([facility]): A) 07/2008: Minimal reversible defect in the apex with possible ischemia.  Likely represents artifact.  EF 59% -> Cath: 80% PDA, 60% PAV and distal LAD dZ); B) 08/2010: Lexiscan - > ischemia the Ant & Inf Apex.  EF 64%.(Med Rx); C) Jan 2013: + Apical  Ischemia - unchanged; D) Jan 2016: EF 76%. No Ischemia /Infarct.   TRANSTHORACIC ECHOCARDIOGRAM  11/2009   [facility]) A) December 2010, 2D echo: EF >55%, mild concentric LVH, diastolic dysfunction Gr. I, LVIDd 4.2 cm, LVIDs 2.3 cm, mild TR (peak RVP 28 mmHg).; B) November 2011, 2D echo: EF >55%, mild concentric LVH, diastolic dysfunction Gr. I, LVIDd 4.3 cm, LVIDs 1.7 cm, mild PR (peak RVP 25 mmHg).; C) 12/2014 Echo: EF >55%, mild LVH, DD gr. I, LAE 3.9cm, Moderate TR (PRVP 35 mmHg)   TRANSTHORACIC ECHOCARDIOGRAM  06/13/2020   [facility]: EF 60-65%.  Normal LV function.  Indeterminate diastolic pressures.  Mild RV enlargement with severely elevated PAP estimated 95. moderate biatrial dilation.  Moderate to severe MR.  Moderate to severe TR.  Mild AS.   Social History   Socioeconomic History   Marital status: Widowed    Spouse name: Not on file   Number of children: 3   Years of education: Not  on file   Highest education level: Not on file  Occupational History   Not on file  Tobacco Use   Smoking status: Every Day    Current packs/day: 1.00    Average packs/day: 1 pack/day for 46.0 years (46.0 ttl pk-yrs)    Types: Cigarettes   Smokeless tobacco: Never   Tobacco comments:    0.5PPD 02/17/2022  Vaping Use   Vaping status: Never Used  Substance and Sexual Activity   Alcohol use: No   Drug use: Yes    Types: Marijuana    Comment: occasional   Sexual activity: Not Currently  Other Topics Concern   Not on file  Social History Narrative   She is a widowed mother of at least 3 daughters.  Unfortunately all 3 daughters have deceased.        2015: Her youngest daughter and son-in-law were run over by an [place] train   2017-10-16: Middle daughter died of complications of DKA   Apr 16, 2021: Oldest daughter died cancer after being on palliative care.   She also had a dog that died after being with her for 18 years.   Social Determinants of Health   Financial Resource Strain: Low  Risk  (06/03/2022)   Received from [facility], [facility]   Overall Financial Resource Strain (CARDIA)    Difficulty of Paying Living Expenses: Not hard at all  Food Insecurity: No Food Insecurity (10/10/2022)   Hunger Vital Sign    Worried About Running Out of Food in the Last Year: Never true    Ran Out of Food in the Last Year: Never true  Transportation Needs: No Transportation Needs (10/10/2022)   PRAPARE - Administrator, Civil Service (Medical): No    Lack of Transportation (Non-Medical): No  Physical Activity: Inactive (11/09/2020)   Received from [facility], [facility]   Exercise Vital Sign    Days of Exercise per Week: 0 days    Minutes of Exercise per Session: 0 min  Stress: Stress Concern Present (11/09/2020)   Received from [facility], [facility]   [employer] of Occupational Health - Occupational Stress Questionnaire    Feeling of Stress : Rather much  Social Connections: Socially Isolated (11/09/2020)   Received from [facility], [facility]   Social Connection and Isolation Panel [NHANES]    Frequency of Communication with Friends and Family: Three times a week    Frequency of Social Gatherings with Friends and Family: Never    Attends Religious Services: Never    Database administrator or Organizations: No    Attends Banker Meetings: Never    Marital Status: Widowed   Family History  Problem Relation Age of Onset   Heart failure Mother    Heart attack Mother        mother died at 91 of an MI   Heart attack Father        father died at 51 of an MI   Heart attack Brother        Brother had an MI in his 61s   Allergies  Allergen Reactions   Chantix [Varenicline] Hives, Shortness Of Breath and Palpitations   Diphenhydramine Hcl Shortness Of Breath   Benadryl [Diphenhydramine Hcl] Rash    Niacin Rash   Trazodone Palpitations   Current Outpatient Medications  Medication Sig Dispense Refill   albuterol (PROVENTIL) (  2.5 MG/3ML) 0.083% nebulizer solution Take 3 mLs (2.5 mg total) by nebulization every 6 (six) hours as needed for wheezing or shortness of breath. 75 mL 0   alprazolam (XANAX) 2 MG tablet Take 2 mg by mouth 2 (two) times daily.      atorvastatin (LIPITOR) 80 MG tablet Take 80 mg by mouth daily.     busPIRone (BUSPAR) 10 MG tablet Take 10 mg by mouth 2 (two) times daily.   2   carvedilol (COREG) 6.25 MG tablet Take 6.25 mg by mouth 2 (two) times daily.     cephALEXin (KEFLEX) 500 MG capsule Take 1 capsule (500 mg total) by mouth 3 (three) times daily. 30 capsule 0   citalopram (CELEXA) 40 MG tablet Take 40 mg by mouth daily.     DULoxetine (CYMBALTA) 60 MG capsule Take 60 mg by mouth daily.     ezetimibe (ZETIA) 10 MG tablet Take 1 tablet (10 mg total) by mouth daily. 90 tablet 3   ferrous gluconate (FERGON) 324 MG tablet Take 1 tablet (324 mg total) by mouth daily with breakfast. 30 tablet 1   furosemide (LASIX) 20 MG tablet Take 1 tablet (20 mg total) by mouth daily. 90 tablet 1   gabapentin (NEURONTIN) 400 MG capsule Take 400 mg by mouth 3 (three) times daily.     ipratropium-albuterol (DUONEB) 0.5-2.5 (3) MG/3ML SOLN Take 3 mLs by nebulization every 6 (six) hours as needed. 360 mL 0   isosorbide mononitrate (IMDUR) 30 MG 24 hr tablet Take 1 tablet (30 mg total) by mouth daily. 30 tablet 6   lisinopril (ZESTRIL) 10 MG tablet Take 1 tablet (10 mg total) by mouth daily. 30 tablet 1   naproxen (NAPROSYN) 500 MG tablet Take 1 tablet (500 mg total) by mouth 2 (two) times daily with a meal. 20 tablet 2   nitroGLYCERIN (NITROSTAT) 0.4 MG SL tablet Place 0.4 mg under the tongue as needed.     oxyCODONE (OXY IR/ROXICODONE) 5 MG immediate release tablet Take 1-2 tablets (5-10 mg total) by mouth every 4 (four) hours as needed for moderate pain or severe pain. 15 tablet 0    oxyCODONE (ROXICODONE) 5 MG immediate release tablet Take 1 tablet (5 mg total) by mouth every 4 (four) hours as needed for severe pain (pain score 7-10). 5 tablet 0   oxyCODONE-acetaminophen (PERCOCET) 5-325 MG tablet Take 1 tablet by mouth every 4 (four) hours as needed for severe pain. 20 tablet 0   pantoprazole (PROTONIX) 40 MG tablet Take 1 tablet (40 mg total) by mouth daily. 30 tablet 1   SYMBICORT 80-4.5 MCG/ACT inhaler Inhale 2 puffs into the lungs 2 (two) times daily.     Vitamin D, Ergocalciferol, (DRISDOL) 1.25 MG (50000 UNIT) CAPS capsule Take by mouth.     No current facility-administered medications for this visit.   Facility-Administered Medications Ordered in Other Visits  Medication Dose Route Frequency Provider Last Rate Last Admin   alteplase (CATHFLO ACTIVASE) injection 2 mg  2 mg Intracatheter Once PRN Creig Hines, MD       heparin lock flush 100 unit/mL  500 Units Intracatheter Once PRN Creig Hines, MD       heparin lock flush 100 unit/mL  250 Units Intracatheter Once PRN Creig Hines, MD       sodium chloride flush (NS) 0.9 % injection 10 mL  10 mL Intracatheter Once PRN Creig Hines, MD       sodium chloride flush (  NS) 0.9 % injection 3 mL  3 mL Intracatheter Once PRN Creig Hines, MD       DG Elbow Complete Left  Result Date: 09/23/2023 CLINICAL DATA:  Left elbow pain and swelling after fall. EXAM: LEFT ELBOW - COMPLETE 3+ VIEW COMPARISON:  None Available. FINDINGS: Severely displaced fracture is seen involving the olecranon. Visualized portions of the distal humerus and radius are unremarkable. IMPRESSION: Severely displaced olecranon fracture. Electronically Signed   By: Lupita Raider M.D.   On: 09/23/2023 15:53   DG Shoulder Left  Result Date: 09/23/2023 CLINICAL DATA:  Left shoulder pain after fall. EXAM: LEFT SHOULDER - 2+ VIEW COMPARISON:  June 24, 2023. FINDINGS: Old proximal left humeral neck fracture is again noted. No acute fracture or  dislocation is noted. Mild degenerative changes seen involving the left acromioclavicular joint. IMPRESSION: Old proximal left humeral neck fracture. Electronically Signed   By: Lupita Raider M.D.   On: 09/23/2023 15:51    Review of Systems:   A ROS was performed including pertinent positives and negatives as documented in the HPI.  Physical Exam :   Constitutional: NAD and appears stated age Neurological: Alert and oriented Psych: Appropriate affect and cooperative There were no vitals taken for this visit.   Comprehensive Musculoskeletal Exam:    Splint is in place which is clean dry intact.  There is swelling of the fingers with 2 rings in place.  There is no ischemia to the digits.  No tenderness about the shoulder although there is some tenderness about the [facility] joint.  Her splint is clean dry intact  Imaging:   Xray (3 views left elbow): Displaced left olecranon fracture    I personally reviewed and interpreted the radiographs.   Assessment:   67 y.o. female with a displaced left olecranon fracture after a fall while feeding her cat.  Overall I did discuss that given the displacement profile I would recommend open reduction internal fixation in order to allow for optimal extension of the elbow.  I did discuss that overall she does have quite obvious osteopenia on x-ray and given her multiple fragility fractures that we would recommend treatment in the future for this.  Will plan to begin with open reduction internal fixation of the left elbow  Plan :    -Plan for left elbow olecranon open reduction internal fixation   After a lengthy discussion of treatment options, including risks, benefits, alternatives, complications of surgical and nonsurgical conservative options, the patient elected surgical repair.   The patient  is aware of the material risks  and complications including, but not limited to injury to adjacent structures, neurovascular injury, infection, numbness,  bleeding, implant failure, thermal burns, stiffness, persistent pain, failure to heal, disease transmission from allograft, need for further surgery, dislocation, anesthetic risks, blood clots, risks of death,and others. The probabilities of surgical success and failure discussed with patient given their particular co-morbidities.The time and nature of expected rehabilitation and recovery was discussed.The patient's questions were all answered preoperatively.  No barriers to understanding were noted. I explained the natural history of the disease process and Rx rationale.  I explained to the patient what I considered to be reasonable expectations given their personal situation.  The final treatment plan was arrived at through a shared patient decision making process model.      I personally saw and evaluated the patient, and participated in the management and treatment plan.  Huel Cote, MD Attending Physician, Orthopedic Surgery  This  document was dictated using Conservation officer, historic buildings. A reasonable attempt at proof reading has been made to minimize errors.

## 2023-09-25 ENCOUNTER — Ambulatory Visit: Payer: 59 | Admitting: Cardiology

## 2023-09-27 NOTE — Plan of Care (Signed)
[facility] Tonsillectomy/Adenoidectomy, Postoperative PEDS care plan entered in error.

## 2023-09-28 ENCOUNTER — Ambulatory Visit: Payer: 59

## 2023-09-28 ENCOUNTER — Ambulatory Visit: Payer: 59 | Admitting: Certified Registered Nurse Anesthetist

## 2023-09-28 ENCOUNTER — Encounter: Payer: Self-pay | Admitting: Orthopaedic Surgery

## 2023-09-28 ENCOUNTER — Other Ambulatory Visit: Payer: Self-pay

## 2023-09-28 ENCOUNTER — Ambulatory Visit
Admission: RE | Admit: 2023-09-28 | Discharge: 2023-09-28 | Disposition: A | Discharge status: Home / Self Care | Payer: 59 | Attending: Orthopaedic Surgery | Admitting: Orthopaedic Surgery

## 2023-09-28 ENCOUNTER — Encounter
Admission: RE | Disposition: A | Discharge status: Home / Self Care | Payer: Self-pay | Source: Home / Self Care | Attending: Orthopaedic Surgery

## 2023-09-28 DIAGNOSIS — F17210 Nicotine dependence, cigarettes, uncomplicated: Secondary | ICD-10-CM | POA: Insufficient documentation

## 2023-09-28 DIAGNOSIS — S52021A Displaced fracture of olecranon process without intraarticular extension of right ulna, initial encounter for closed fracture: Secondary | ICD-10-CM

## 2023-09-28 DIAGNOSIS — I110 Hypertensive heart disease with heart failure: Secondary | ICD-10-CM | POA: Insufficient documentation

## 2023-09-28 DIAGNOSIS — J449 Chronic obstructive pulmonary disease, unspecified: Secondary | ICD-10-CM | POA: Diagnosis not present

## 2023-09-28 DIAGNOSIS — W19XXXA Unspecified fall, initial encounter: Secondary | ICD-10-CM | POA: Diagnosis not present

## 2023-09-28 DIAGNOSIS — I272 Pulmonary hypertension, unspecified: Secondary | ICD-10-CM | POA: Insufficient documentation

## 2023-09-28 DIAGNOSIS — I5042 Chronic combined systolic (congestive) and diastolic (congestive) heart failure: Secondary | ICD-10-CM | POA: Insufficient documentation

## 2023-09-28 DIAGNOSIS — I2510 Atherosclerotic heart disease of native coronary artery without angina pectoris: Secondary | ICD-10-CM | POA: Insufficient documentation

## 2023-09-28 DIAGNOSIS — S52022A Displaced fracture of olecranon process without intraarticular extension of left ulna, initial encounter for closed fracture: Secondary | ICD-10-CM | POA: Insufficient documentation

## 2023-09-28 DIAGNOSIS — Z8249 Family history of ischemic heart disease and other diseases of the circulatory system: Secondary | ICD-10-CM | POA: Diagnosis not present

## 2023-09-28 DIAGNOSIS — I739 Peripheral vascular disease, unspecified: Secondary | ICD-10-CM | POA: Diagnosis not present

## 2023-09-28 HISTORY — PX: ORIF ELBOW FRACTURE: SHX5031

## 2023-09-28 SURGERY — OPEN REDUCTION INTERNAL FIXATION (ORIF) ELBOW/OLECRANON FRACTURE
Anesthesia: Regional | Site: Elbow | Laterality: Left

## 2023-09-28 MED ORDER — MIDAZOLAM HCL 2 MG/2ML IJ SOLN
INTRAMUSCULAR | Status: AC
  Filled 2023-09-28: qty 2

## 2023-09-28 MED ORDER — ROCURONIUM BROMIDE 10 MG/ML (PF) SYRINGE
PREFILLED_SYRINGE | INTRAVENOUS | Status: AC
  Filled 2023-09-28: qty 10

## 2023-09-28 MED ORDER — EPHEDRINE SULFATE-NACL 50-0.9 MG/10ML-% IV SOSY
PREFILLED_SYRINGE | INTRAVENOUS | Status: DC | PRN
Start: 2023-09-28 — End: 2023-09-28
  Administered 2023-09-28: 5 mg via INTRAVENOUS

## 2023-09-28 MED ORDER — ACETAMINOPHEN 500 MG PO TABS
ORAL_TABLET | ORAL | Status: AC
  Filled 2023-09-28: qty 2

## 2023-09-28 MED ORDER — DEXAMETHASONE SODIUM PHOSPHATE 10 MG/ML IJ SOLN
INTRAMUSCULAR | Status: AC
  Filled 2023-09-28: qty 1

## 2023-09-28 MED ORDER — OXYCODONE HCL 5 MG PO TABS: 5.0000 mg | ORAL_TABLET | ORAL | 0 refills | Status: DC | PRN

## 2023-09-28 MED ORDER — GABAPENTIN 300 MG PO CAPS
300.0000 mg | ORAL_CAPSULE | Freq: Once | ORAL | Status: AC
  Administered 2023-09-28: 300 mg via ORAL

## 2023-09-28 MED ORDER — FENTANYL CITRATE PF 50 MCG/ML IJ SOSY
50.0000 ug | PREFILLED_SYRINGE | Freq: Once | INTRAMUSCULAR | Status: AC
  Administered 2023-09-28: 50 ug via INTRAVENOUS

## 2023-09-28 MED ORDER — ACETAMINOPHEN 500 MG PO TABS
1000.0000 mg | ORAL_TABLET | Freq: Once | ORAL | Status: AC
  Administered 2023-09-28: 1000 mg via ORAL

## 2023-09-28 MED ORDER — LIDOCAINE HCL (PF) 1 % IJ SOLN
INTRAMUSCULAR | Status: DC | PRN
Start: 2023-09-28 — End: 2023-09-28
  Administered 2023-09-28: 5 mL via SUBCUTANEOUS

## 2023-09-28 MED ORDER — LIDOCAINE HCL (PF) 1 % IJ SOLN
INTRAMUSCULAR | Status: AC
  Filled 2023-09-28: qty 5

## 2023-09-28 MED ORDER — CARVEDILOL 3.125 MG PO TABS
ORAL_TABLET | ORAL | Status: AC
  Filled 2023-09-28: qty 2

## 2023-09-28 MED ORDER — SUGAMMADEX SODIUM 200 MG/2ML IV SOLN
INTRAVENOUS | Status: DC | PRN
Start: 2023-09-28 — End: 2023-09-28
  Administered 2023-09-28: 180 mg via INTRAVENOUS

## 2023-09-28 MED ORDER — ONDANSETRON HCL 4 MG/2ML IJ SOLN
INTRAMUSCULAR | Status: DC | PRN
  Administered 2023-09-28: 4 mg via INTRAVENOUS

## 2023-09-28 MED ORDER — ORAL CARE MOUTH RINSE: 15.0000 mL | Freq: Once | OROMUCOSAL | Status: AC

## 2023-09-28 MED ORDER — FENTANYL CITRATE PF 50 MCG/ML IJ SOSY
PREFILLED_SYRINGE | INTRAMUSCULAR | Status: AC
  Filled 2023-09-28: qty 1

## 2023-09-28 MED ORDER — PHENYLEPHRINE HCL (PRESSORS) 10 MG/ML IV SOLN
INTRAVENOUS | Status: DC | PRN
  Administered 2023-09-28: 240 ug via INTRAVENOUS

## 2023-09-28 MED ORDER — PROPOFOL 1000 MG/100ML IV EMUL
INTRAVENOUS | Status: AC
  Filled 2023-09-28: qty 100

## 2023-09-28 MED ORDER — PROPOFOL 10 MG/ML IV BOLUS
INTRAVENOUS | Status: DC | PRN
  Administered 2023-09-28: 100 mg via INTRAVENOUS
  Administered 2023-09-28: 75 ug/kg/min via INTRAVENOUS

## 2023-09-28 MED ORDER — CEFAZOLIN SODIUM-DEXTROSE 2-4 GM/100ML-% IV SOLN
2.0000 g | INTRAVENOUS | Status: AC
  Administered 2023-09-28: 2 g via INTRAVENOUS

## 2023-09-28 MED ORDER — ROPIVACAINE HCL 5 MG/ML IJ SOLN
INTRAMUSCULAR | Status: DC | PRN
  Administered 2023-09-28: 30 mL via PERINEURAL

## 2023-09-28 MED ORDER — CHLORHEXIDINE GLUCONATE 0.12 % MT SOLN
OROMUCOSAL | Status: AC
  Filled 2023-09-28: qty 15

## 2023-09-28 MED ORDER — ONDANSETRON HCL 4 MG/2ML IJ SOLN
INTRAMUSCULAR | Status: AC
  Filled 2023-09-28: qty 2

## 2023-09-28 MED ORDER — LABETALOL HCL 5 MG/ML IV SOLN
INTRAVENOUS | Status: DC | PRN
  Administered 2023-09-28 (×2): 5 mg via INTRAVENOUS

## 2023-09-28 MED ORDER — FENTANYL CITRATE (PF) 100 MCG/2ML IJ SOLN
25.0000 ug | INTRAMUSCULAR | Status: DC | PRN
  Administered 2023-09-28 (×3): 25 ug via INTRAVENOUS
  Administered 2023-09-28: 50 ug via INTRAVENOUS

## 2023-09-28 MED ORDER — 0.9 % SODIUM CHLORIDE (POUR BTL) OPTIME
TOPICAL | Status: DC | PRN
  Administered 2023-09-28: 500 mL

## 2023-09-28 MED ORDER — LIDOCAINE HCL (PF) 2 % IJ SOLN
INTRAMUSCULAR | Status: AC
  Filled 2023-09-28: qty 5

## 2023-09-28 MED ORDER — TRANEXAMIC ACID-NACL 1000-0.7 MG/100ML-% IV SOLN
1000.0000 mg | INTRAVENOUS | Status: AC
  Administered 2023-09-28: 1000 mg via INTRAVENOUS

## 2023-09-28 MED ORDER — ACETAMINOPHEN 500 MG PO TABS: 500.0000 mg | ORAL_TABLET | Freq: Three times a day (TID) | ORAL | 0 refills | Status: AC

## 2023-09-28 MED ORDER — CARVEDILOL 6.25 MG PO TABS
6.2500 mg | ORAL_TABLET | Freq: Once | ORAL | Status: AC
  Administered 2023-09-28: 6.25 mg via ORAL

## 2023-09-28 MED ORDER — ROPIVACAINE HCL 5 MG/ML IJ SOLN
INTRAMUSCULAR | Status: AC
  Filled 2023-09-28: qty 30

## 2023-09-28 MED ORDER — FENTANYL CITRATE (PF) 100 MCG/2ML IJ SOLN
INTRAMUSCULAR | Status: AC
  Filled 2023-09-28: qty 2

## 2023-09-28 MED ORDER — BUPIVACAINE HCL (PF) 0.5 % IJ SOLN
INTRAMUSCULAR | Status: AC
  Filled 2023-09-28: qty 30

## 2023-09-28 MED ORDER — PROPOFOL 10 MG/ML IV BOLUS
INTRAVENOUS | Status: AC
  Filled 2023-09-28: qty 20

## 2023-09-28 MED ORDER — LISINOPRIL 10 MG PO TABS
10.0000 mg | ORAL_TABLET | Freq: Once | ORAL | Status: AC
  Administered 2023-09-28: 10 mg via ORAL

## 2023-09-28 MED ORDER — MIDAZOLAM HCL 2 MG/2ML IJ SOLN
1.0000 mg | INTRAMUSCULAR | Status: AC | PRN
Start: 2023-09-28 — End: 2023-09-28
  Administered 2023-09-28 (×2): 1 mg via INTRAVENOUS

## 2023-09-28 MED ORDER — FENTANYL CITRATE (PF) 100 MCG/2ML IJ SOLN
INTRAMUSCULAR | Status: DC | PRN
Start: 2023-09-28 — End: 2023-09-28
  Administered 2023-09-28 (×2): 50 ug via INTRAVENOUS

## 2023-09-28 MED ORDER — ROCURONIUM BROMIDE 100 MG/10ML IV SOLN
INTRAVENOUS | Status: DC | PRN
Start: 2023-09-28 — End: 2023-09-28
  Administered 2023-09-28: 50 mg via INTRAVENOUS
  Administered 2023-09-28: 10 mg via INTRAVENOUS

## 2023-09-28 MED ORDER — LISINOPRIL 5 MG PO TABS
ORAL_TABLET | ORAL | Status: AC
  Filled 2023-09-28: qty 2

## 2023-09-28 MED ORDER — GABAPENTIN 300 MG PO CAPS
ORAL_CAPSULE | ORAL | Status: AC
  Filled 2023-09-28: qty 1

## 2023-09-28 MED ORDER — CHLORHEXIDINE GLUCONATE 0.12 % MT SOLN
15.0000 mL | Freq: Once | OROMUCOSAL | Status: AC
  Administered 2023-09-28: 15 mL via OROMUCOSAL

## 2023-09-28 MED ORDER — CEFAZOLIN SODIUM-DEXTROSE 2-4 GM/100ML-% IV SOLN
INTRAVENOUS | Status: AC
  Filled 2023-09-28: qty 100

## 2023-09-28 MED ORDER — DEXAMETHASONE SODIUM PHOSPHATE 10 MG/ML IJ SOLN
INTRAMUSCULAR | Status: DC | PRN
Start: 2023-09-28 — End: 2023-09-28
  Administered 2023-09-28: 10 mg via INTRAVENOUS

## 2023-09-28 MED ORDER — LACTATED RINGERS IV SOLN: INTRAVENOUS | Status: DC

## 2023-09-28 MED ORDER — TRANEXAMIC ACID-NACL 1000-0.7 MG/100ML-% IV SOLN
INTRAVENOUS | Status: AC
  Filled 2023-09-28: qty 100

## 2023-09-28 SURGICAL SUPPLY — 72 items
APL PRP STRL LF DISP 70% ISPRP (MISCELLANEOUS) ×1
BIT DRILL QC 2.0 SHORT EVOS SM (DRILL) IMPLANT
BIT DRILL QC 2.5MM SHRT EVO SM (DRILL) IMPLANT
BLADE SURG 15 STRL LF DISP TIS (BLADE) ×1 IMPLANT
BLADE SURG 15 STRL SS (BLADE) ×1
BNDG CMPR 5X4 CHSV STRCH STRL (GAUZE/BANDAGES/DRESSINGS) ×1
BNDG CMPR STD VLCR NS LF 5.8X4 (GAUZE/BANDAGES/DRESSINGS) ×2
BNDG COHESIVE 4X5 TAN STRL LF (GAUZE/BANDAGES/DRESSINGS) ×1 IMPLANT
BNDG ELASTIC 4X5.8 VLCR NS LF (GAUZE/BANDAGES/DRESSINGS) ×2 IMPLANT
BNDG ESMARCH 4 X 12 STRL LF (GAUZE/BANDAGES/DRESSINGS) ×2
BNDG ESMARCH 4X12 STRL LF (GAUZE/BANDAGES/DRESSINGS) ×2 IMPLANT
CHLORAPREP W/TINT 26 (MISCELLANEOUS) ×1 IMPLANT
COVER MAYO STAND STRL (DRAPES) ×1 IMPLANT
CUFF TOURN SGL QUICK 18X4 (TOURNIQUET CUFF) ×1 IMPLANT
CUFF TOURN SGL QUICK 24 (TOURNIQUET CUFF)
CUFF TRNQT CYL 24X4X16.5-23 (TOURNIQUET CUFF) ×1 IMPLANT
DRAPE C-ARM XRAY 36X54 (DRAPES) ×1 IMPLANT
DRAPE SHEET LG 3/4 BI-LAMINATE (DRAPES) ×1 IMPLANT
DRAPE U-SHAPE 47X51 STRL (DRAPES) ×1 IMPLANT
DRILL QC 2.0 SHORT EVOS SM (DRILL) ×1
DRILL QC 2.5MM SHORT EVOS SM (DRILL) ×1
ELECT CAUTERY BLADE 6.4 (BLADE) ×1 IMPLANT
ELECT REM PT RETURN 9FT ADLT (ELECTROSURGICAL) ×1
ELECTRODE REM PT RTRN 9FT ADLT (ELECTROSURGICAL) ×1 IMPLANT
GAUZE SPONGE 4X4 12PLY STRL (GAUZE/BANDAGES/DRESSINGS) ×1 IMPLANT
GAUZE XEROFORM 1X8 LF (GAUZE/BANDAGES/DRESSINGS) ×1 IMPLANT
GLOVE BIO SURGEON STRL SZ8 (GLOVE) ×3 IMPLANT
GLOVE BIOGEL PI IND STRL 8 (GLOVE) ×1 IMPLANT
GLOVE INDICATOR 8.0 STRL GRN (GLOVE) ×1 IMPLANT
GOWN SRG 2XL LVL 4 RGLN SLV (GOWNS) ×1 IMPLANT
GOWN STRL NON-REIN 2XL LVL4 (GOWNS) ×1
GOWN STRL REUS W/ TWL LRG LVL3 (GOWN DISPOSABLE) ×1 IMPLANT
GOWN STRL REUS W/ TWL XL LVL3 (GOWN DISPOSABLE) ×1 IMPLANT
GOWN STRL REUS W/TWL LRG LVL3 (GOWN DISPOSABLE) ×1
GOWN STRL REUS W/TWL XL LVL3 (GOWN DISPOSABLE) ×1
HANDLE YANKAUER SUCT BULB TIP (MISCELLANEOUS) IMPLANT
K-WIRE 1.6 (WIRE) ×5
K-WIRE FX150X1.6XTROC PNT (WIRE) ×5
KIT TURNOVER KIT A (KITS) ×1 IMPLANT
KWIRE FX150X1.6XTROC PNT (WIRE) IMPLANT
MANIFOLD NEPTUNE II (INSTRUMENTS) ×1 IMPLANT
NDL FILTER BLUNT 18X1 1/2 (NEEDLE) ×1 IMPLANT
NEEDLE FILTER BLUNT 18X1 1/2 (NEEDLE) ×1 IMPLANT
NS IRRIG 500ML POUR BTL (IV SOLUTION) ×1 IMPLANT
PACK EXTREMITY ARMC (MISCELLANEOUS) ×1 IMPLANT
PAD ABD DERMACEA PRESS 5X9 (GAUZE/BANDAGES/DRESSINGS) ×2 IMPLANT
PAD PREP OB/GYN DISP 24X41 (PERSONAL CARE ITEMS) ×1 IMPLANT
PADDING CAST BLEND 4X4 STRL (MISCELLANEOUS) ×2 IMPLANT
PLATE OLECR 2.7X61 3H LT (Plate) IMPLANT
PLATE OLECRANON 3 HOLE (Plate) ×1 IMPLANT
SCREW CORT 2.7X14 T8 EVOS (Screw) IMPLANT
SCREW CORT 3.5X30 ST EVOS (Screw) IMPLANT
SCREW CORT EVOS ST 3.5X20 (Screw) IMPLANT
SCREW CORT EVOS ST 3.5X28 (Screw) IMPLANT
SCREW CORTEX 3.5X26 (Screw) IMPLANT
SCREW CTX 3.5X36MM EVOS (Screw) IMPLANT
SCREW EVOS 2.7X18 LOCK T8 (Screw) IMPLANT
SPLINT CAST 1 STEP 5X30 WHT (MISCELLANEOUS) ×1 IMPLANT
SPONGE T-LAP 18X18 ~~LOC~~+RFID (SPONGE) ×1 IMPLANT
STAPLER SKIN PROX 35W (STAPLE) ×1 IMPLANT
STOCKINETTE IMPERVIOUS 9X36 MD (GAUZE/BANDAGES/DRESSINGS) ×1 IMPLANT
STRIP CLOSURE SKIN 1/2X4 (GAUZE/BANDAGES/DRESSINGS) ×1 IMPLANT
SUT ETHILON 3-0 FS-10 30 BLK (SUTURE) ×2
SUT FIBERWIRE #2 38 T-5 BLUE (SUTURE) ×1
SUT VIC AB 0 CT2 27 (SUTURE) ×1 IMPLANT
SUT VIC AB 2-0 CT2 27 (SUTURE) ×1 IMPLANT
SUTURE EHLN 3-0 FS-10 30 BLK (SUTURE) ×1 IMPLANT
SUTURE FIBERWR #2 38 T-5 BLUE (SUTURE) IMPLANT
SYR 5ML LL (SYRINGE) ×1 IMPLANT
TAPE TRANSPORE STRL 2 31045 (GAUZE/BANDAGES/DRESSINGS) ×1 IMPLANT
TRAP FLUID SMOKE EVACUATOR (MISCELLANEOUS) ×1 IMPLANT
WATER STERILE IRR 500ML POUR (IV SOLUTION) ×1 IMPLANT

## 2023-09-28 NOTE — Interval H&P Note (Signed)
History and Physical Interval Note:  09/28/2023 11:21 AM  Traci Duran  has presented today for surgery, with the diagnosis of LEFT DISPLACED OLECRANON FRACTURE.  The various methods of treatment have been discussed with the patient and family. After consideration of risks, benefits and other options for treatment, the patient has consented to  Procedure(s): LEFT Open Reduction Internal fixation (ORIF), fracture, elbow/olecranon (Left) as a surgical intervention.  The patient's history has been reviewed, patient examined, no change in status, stable for surgery.  I have reviewed the patient's chart and labs.  Questions were answered to the patient's satisfaction.     Huel Cote

## 2023-09-28 NOTE — Progress Notes (Signed)
Went over d/c instructions with grandson, Andy Gauss, via phone call.  Grandson stated understanding and all questions and concerns were addressed and answered to his satisfaction.  Grandson stated there is a person living in the house with the patient and will be there when pt goes home.

## 2023-09-28 NOTE — Brief Op Note (Signed)
   Brief Op Note  Date of Surgery: 09/28/2023  Preoperative Diagnosis: LEFT DISPLACED OLECRANON FRACTURE  Postoperative Diagnosis: same  Procedure: Procedure(s): LEFT Open Reduction Internal fixation (ORIF), fracture, elbow/olecranon  Implants: Implant Name Type Inv. Item Serial No. Manufacturer Lot No. LRB No. Used Action  3.5 26mm Cortex Screw      Left 1 Implanted  PLATE OLECRANON 3 HOLE - [id number] Plate PLATE OLECRANON 3 HOLE  [employer]  Left 1 Implanted  3.5 20mm Locking Screw      Left 1 Implanted  SCREW CORT EVOS ST 3.5X28 - [id number] Screw SCREW CORT EVOS ST 3.5X28  [employer]  Left 1 Implanted  SCREW EVOS 2.7X18 LOCK T8 - [id number] Screw SCREW EVOS 2.7X18 LOCK T8  [employer]  Left 1 Implanted  2.7 14mm Locking Screw     [id number] Left 3 Implanted    Surgeons: Surgeon(s): Huel Cote, MD  Anesthesia: General    Estimated Blood Loss: See anesthesia record  Complications: None  Condition to PACU: Stable  Benancio Deeds, MD 09/28/2023 1:50 PM

## 2023-09-28 NOTE — Op Note (Signed)
Date of Surgery: 09/28/2023  INDICATIONS: Ms. Selkirk is a 67 y.o.-year-old female with left displaced olecranon fracture.  The risk and benefits of the procedure were discussed in detail and documented in the pre-operative evaluation.   PREOPERATIVE DIAGNOSIS: 1. Left displaced olecranon fracture  POSTOPERATIVE DIAGNOSIS: Same.  PROCEDURE: 1.  Open reduction internal fixation left olecranon, proximal ulna  SURGEON: Benancio Deeds MD  ASSISTANT: Kerby Less, ATC  ANESTHESIA:  general plus interscalene nerve block  IV FLUIDS AND URINE: See anesthesia record.  ANTIBIOTICS: Ancef  ESTIMATED BLOOD LOSS: 50 mL.  IMPLANTS:  Implant Name Type Inv. Item Serial No. Manufacturer Lot No. LRB No. Used Action  3.5 26mm Cortex Screw      Left 1 Implanted  PLATE OLECRANON 3 HOLE - [id number] Plate PLATE OLECRANON 3 HOLE  [employer]  Left 1 Implanted  3.5 20mm Locking Screw      Left 1 Implanted  SCREW CORT EVOS ST 3.5X28 - [id number] Screw SCREW CORT EVOS ST 3.5X28  [employer]  Left 1 Implanted  SCREW EVOS 2.7X18 LOCK T8 - [id number] Screw SCREW EVOS 2.7X18 LOCK T8  [employer]  Left 1 Implanted  2.7 14mm Locking Screw     [id number] Left 3 Implanted    DRAINS: None  CULTURES: None  COMPLICATIONS: none  DESCRIPTION OF PROCEDURE:   The patient was identified in the preoperative holding area.  The correct site was marked with nursing.  Antibiotics were given 1 hour prior to skin incision.  She is subsequently taken back to the emergency room.  Anesthesia was induced.  She was moved over to the operating room table.  She was placed in the lateral decubitus position with careful padding of the peroneal nerve and placement of an axillary roll.  This time she is prepped and draped in the usual sterile fashion.  Final timeout was again performed.  15 blade was used to make a central midline incision over the ulna.  This was used to  dissect down to the level of bone.  Electrocautery was used to achieve hemostasis.  Fracture hematoma was irrigated with normal saline.  At this time the fracture identified and provisionally reduced using a dental pick as well as to 2.0 K wires.  The plate was selected based on intraoperative visualization and fluoroscopy.  This was placed.  This was temporarily pinned in place using K wires.  AP and lateral fluoroscopy confirmed anatomic reduction with appropriate plate fixation.  At this time the distal screws were all placed with 3.5 nonlocking screws.  These all had good bite.  Approximately fluoroscopy was used to obtain the longest trajectory.  2.7 mm locking screws were placed proximally.  At this time a #2 FiberWire was used to whipstitch the triceps tendon up and down and then into the plate.  This provided excellent apposition of the triceps to the plate.   The wound was thoroughly irrigated.  Final fluoroscopy was again showed anatomic reduction.  The wound was closed with 2-0 Vicryl and 3-0 nylon.  Dressing was placed with Xeroform gauze Webril Ace wrap and a posterior slab splint.  All counts correct at the end of the case.  She is taken the PACU without complication    POSTOPERATIVE PLAN: She will be nonweightbearing for total of 2 weeks.  At 2 weeks we will discontinue a posterior slab splint and allow her to begin active range of motion.  I will plan to see  her back in 2 weeks for wound check and suture removal  Benancio Deeds, MD 1:50 PM

## 2023-09-28 NOTE — Anesthesia Procedure Notes (Signed)
Anesthesia Regional Block: Supraclavicular block   Pre-Anesthetic Checklist: , timeout performed,  Correct Patient, Correct Site, Correct Laterality,  Correct Procedure, Correct Position, site marked,  Risks and benefits discussed,  Surgical consent,  Pre-op evaluation,  At surgeon's request and post-op pain management  Laterality: Left and Upper  Prep: chloraprep       Needles:  Injection technique: Single-shot  Needle Type: Stimiplex     Needle Length: 5cm  Needle Gauge: 22     Additional Needles:   Procedures:,,,, ultrasound used (permanent image in chart),,    Narrative:  Start time: 09/28/2023 10:51 AM End time: 09/28/2023 10:54 AM Injection made incrementally with aspirations every 5 mL.  Performed by: Personally  Anesthesiologist: Lenard Simmer, MD  Additional Notes: Functioning IV was confirmed and monitors were applied.  A 50mm 22ga Stimuplex needle was used. Sterile prep and drape,hand hygiene and sterile gloves were used.  Negative aspiration and negative test dose prior to incremental administration of local anesthetic. The patient tolerated the procedure well.

## 2023-09-28 NOTE — Anesthesia Procedure Notes (Signed)
Procedure Name: Intubation Date/Time: 09/28/2023 12:00 PM  Performed by: Ginger Carne, CRNAPre-anesthesia Checklist: Patient identified, Emergency Drugs available, Suction available, Patient being monitored and Timeout performed Patient Re-evaluated:Patient Re-evaluated prior to induction Oxygen Delivery Method: Circle system utilized Preoxygenation: Pre-oxygenation with 100% oxygen Induction Type: IV induction Ventilation: Mask ventilation without difficulty Laryngoscope Size: McGraph and 3 Grade View: Grade II Tube type: Oral Tube size: 6.5 mm Number of attempts: 1 Airway Equipment and Method: Stylet and Video-laryngoscopy Placement Confirmation: ETT inserted through vocal cords under direct vision, positive ETCO2 and breath sounds checked- equal and bilateral Secured at: 21 cm Tube secured with: Tape Dental Injury: Teeth and Oropharynx as per pre-operative assessment

## 2023-09-28 NOTE — Anesthesia Preprocedure Evaluation (Signed)
Anesthesia Evaluation  Patient identified by MRN, date of birth, ID band Patient awake    Reviewed: Allergy & Precautions, NPO status , Patient's Chart, lab work & pertinent test results  History of Anesthesia Complications Negative for: history of anesthetic complications  Airway Mallampati: II  TM Distance: >3 FB Neck ROM: Full    Dental  (+) Teeth Intact, Dental Advidsory Given   Pulmonary neg shortness of breath, neg sleep apnea, COPD,  COPD inhaler, Recent URI , Residual Cough, Current SmokerPatient did not abstain from smoking.   Pulmonary exam normal breath sounds clear to auscultation       Cardiovascular Exercise Tolerance: Good METShypertension, pulmonary hypertension (mild)(-) angina + CAD, + Past MI, + Peripheral Vascular Disease and +CHF  (-) Cardiac Stents and (-) CABG (-) dysrhythmias + Valvular Problems/Murmurs MR and AS  Rhythm:Regular Rate:Normal - Systolic murmurs  1. Left ventricular ejection fraction, by estimation, is 60 to 65%. The  left ventricle has normal function. The left ventricle has no regional  wall motion abnormalities. There is moderate left ventricular hypertrophy.  Left ventricular diastolic  parameters are indeterminate.   2. Right ventricular systolic function is normal. The right ventricular  size is normal. There is mildly elevated pulmonary artery systolic  pressure. The estimated right ventricular systolic pressure is 42.0 mmHg.   3. Left atrial size was mildly dilated.   4. The mitral valve is rheumatic. Moderate mitral valve regurgitation.  Mild mitral stenosis. The mean mitral valve gradient is 5.0 mmHg.   5. Tricuspid valve regurgitation is mild to moderate.   6. The aortic valve is calcified. There is moderate calcification of the  aortic valve. Aortic valve regurgitation is not visualized. Mild aortic  valve stenosis.   7. The inferior vena cava is normal in size with greater than  50%  respiratory variability, suggesting right atrial pressure of 3 mmHg.     Neuro/Psych  PSYCHIATRIC DISORDERS Anxiety Depression    negative neurological ROS     GI/Hepatic ,GERD  ,,(+)     (-) substance abuse    Endo/Other  neg diabetes    Renal/GU negative Renal ROS     Musculoskeletal   Abdominal   Peds  Hematology  (+) Blood dyscrasia, anemia   Anesthesia Other Findings Past Medical History: 05/2020: Anemia     Comment:  Thought to be related to GI bleed. No date: Anxiety 10/2010: Bilateral carotid artery stenosis without cerebral infarction     Comment:   November 2011, CTA of the head and neck: Near complete              occlusion versus severe stenosis of nearly the entire               basilar artery, complete occlusion of the right vertebral              artery from its origin off the right SCA, moderate               noncalcified atherosclerotic in the proximal left SCA               which results in a 40% stenosis. Right ICA with               approximately 15% stenosis, left   Nov. 2012 Carotid               Duplex: >75% diameter re No date: CHF (congestive heart failure), chronic HFrEF NYHA class II,  chronic,  diastolic (HCC)     Comment:  Chronic HFrEF No date: COPD (chronic obstructive pulmonary disease) (HCC) 10/2009: Coronary artery disease, non-occlusive     Comment:  Cardiac Cath: EF 85%;  40% prox &mid RCA -> 80% RPDA and              AMrg, 60% RPAV.  40% prox & distal LAD w. 60% dLAD and               D2, - Med Rx; Myoview 9/19: Small size, Mod severity               Apical Anterior defect  c/w ISCHEMIA. -> INTERMEDIATE               RISK (med Rx because of anemia, and known distal LAD and               PDA disease. ->  Similar defect noted in August 2009) No date: Depression No date: Gastritis No date: GERD (gastroesophageal reflux disease) No date: Hemorrhoids No date: Hyperlipidemia No date: Hypertension 06/2020: Mild aortic  stenosis by prior echocardiogram     Comment:  Mild aortic stenosis by echo 06/2020: Moderate to severe mitral regurgitation     Comment:  Echo at [facility]: Moderate-severe MR with mild RV               enlargement and severely elevated PA 95 mmHg. No date: Myocardial infarction [facility])     Comment:  Demand ischemia infarction initially in 2010 07/2008: PAD (peripheral artery disease) (HCC)     Comment:  a) 8/'09: normal ABIs; b) post procedure Dopplers               December 2010: Suggestion of left PFA/CFA-SFV AV               fistula.; c) 02/2010: Left CFA-PFA AV fistula still               present.; d) 04/2010: CTA Abd/Pelvis- LE Runoff: Extensive              Mixed plaque in the Abd Ao.  Patent visceral As.  Bilat               2V runnoff-feet ATA & Peroneal A;  (confirmed by               Arteriogram 03/2011); e) ABIs October 2017 R ABI 0.88, L               ABI 0.79. 06/2020: Pulmonary hypertension (HCC)     Comment:  [facility] echo shows moderate-severe MR, moderate to severe               TR with RVP estimated 95 mmHg. 10/27/2010: Stenosis of left subclavian artery (HCC)     Comment:  ~75% by recent doppler -- CHECK BP ON R ARM (or               bilaterally for confirmation)  Reproductive/Obstetrics                             Anesthesia Physical Anesthesia Plan  ASA: 3  Anesthesia Plan: General and Regional   Post-op Pain Management: Regional block*   Induction: Intravenous  PONV Risk Score and Plan: 2 and Propofol infusion and TIVA  Airway Management Planned: Simple Face Mask and Natural Airway  Additional Equipment: None  Intra-op Plan:   Post-operative Plan:  Informed Consent: I have reviewed the patients History and Physical, chart, labs and discussed the procedure including the risks, benefits and alternatives for the proposed anesthesia with the patient or authorized representative who has indicated his/her understanding and acceptance.      Dental advisory given  Plan Discussed with: CRNA and Surgeon  Anesthesia Plan Comments: (Discussed risks of anesthesia with patient, including possibility of difficulty with spontaneous ventilation under anesthesia necessitating airway intervention, PONV, and rare risks such as cardiac or respiratory or neurological events, and allergic reactions. Discussed the role of CRNA in patient's perioperative care. Patient understands. Patient counseled on benefits of smoking cessation, and increased perioperative risks associated with continued smoking. )        Anesthesia Quick Evaluation

## 2023-09-28 NOTE — H&P (Signed)
Chief Complaint: Left elbow fracture        History of Present Illness:      Traci Duran is a 67 y.o. female presents the left lower condyle fracture after a fall the day prior.  She states that she is currently being treated for possible humerus fracture at [facility] manage estimated to the point where she is able to overhead lift.  She subsequently was going to feed her cat and had a fall directly onto the side.  She is here today for further discussion of her olecranon fracture.  She was placed in a posterior slab splint.       Surgical History:   None.   PMH/PSH/Family History/Social History/Meds/Allergies:         Past Medical History:  Diagnosis Date   Anemia 05/2020    Thought to be related to GI bleed.   Anxiety     Bilateral carotid artery stenosis without cerebral infarction 10/2010     November 2011, CTA of the head and neck: Near complete occlusion versus severe stenosis of nearly the entire basilar artery, complete occlusion of the right vertebral artery from its origin off the right SCA, moderate noncalcified atherosclerotic in the proximal left SCA which results in a 40% stenosis. Right ICA with approximately 15% stenosis, left   Nov. 2012 Carotid Duplex: >75% diameter re   CHF (congestive heart failure), chronic HFrEF NYHA class II, chronic, diastolic (HCC)      Chronic HFrEF   COPD (chronic obstructive pulmonary disease) (HCC)     Coronary artery disease, non-occlusive 10/2009    Cardiac Cath: EF 85%;  40% prox &mid RCA -> 80% RPDA and AMrg, 60% RPAV.  40% prox & distal LAD w. 60% dLAD and D2, - Med Rx; Myoview 9/19: Small size, Mod severity Apical Anterior defect  c/w ISCHEMIA. -> INTERMEDIATE RISK (med Rx because of anemia, and known distal LAD and PDA disease. ->  Similar defect noted in August 2009)   Depression     Gastritis     GERD (gastroesophageal reflux disease)     Hemorrhoids     Hyperlipidemia     Hypertension     Mild aortic  stenosis by prior echocardiogram 06/2020    Mild aortic stenosis by echo   Moderate to severe mitral regurgitation 06/2020    Echo at [facility]: Moderate-severe MR with mild RV enlargement and severely elevated PA 95 mmHg.   Myocardial infarction [facility])      Demand ischemia infarction initially in 2010   PAD (peripheral artery disease) (HCC) 07/2008    a) 8/'09: normal ABIs; b) post procedure Dopplers December 2010: Suggestion of left PFA/CFA-SFV AV fistula.; c) 02/2010: Left CFA-PFA AV fistula still present.; d) 04/2010: CTA Abd/Pelvis- LE Runoff: Extensive Mixed plaque in the Abd Ao.  Patent visceral As.  Bilat 2V runnoff-feet ATA & Peroneal A;  (confirmed by Arteriogram 03/2011); e) ABIs October 2017 R ABI 0.88, L ABI 0.79.   Pulmonary hypertension (HCC) 06/2020    [facility] echo shows moderate-severe MR, moderate to severe TR with RVP estimated 95 mmHg.   Stenosis of left subclavian artery (HCC) 10/27/2010    ~75% by recent doppler -- CHECK BP ON R ARM (or bilaterally for confirmation)             Past Surgical History:  Procedure Laterality Date   APPENDECTOMY       COLONOSCOPY WITH PROPOFOL N/A 06/09/2017    Procedure: COLONOSCOPY WITH PROPOFOL;  Surgeon: Midge Minium, MD;  Location: [facility] ENDOSCOPY;  Service: Endoscopy;  Laterality: N/A;   COLONOSCOPY WITH PROPOFOL N/A 10/08/2019    Procedure: COLONOSCOPY WITH PROPOFOL;  Surgeon: Midge Minium, MD;  Location: [facility] ENDOSCOPY;  Service: Endoscopy;  Laterality: N/A;   COLONOSCOPY WITH PROPOFOL N/A 10/12/2022    Procedure: COLONOSCOPY WITH PROPOFOL;  Surgeon: Toney Reil, MD;  Location: [facility] ENDOSCOPY;  Service: Gastroenterology;  Laterality: N/A;   ENTEROSCOPY N/A 10/08/2019    Procedure: ENTEROSCOPY;  Surgeon: Midge Minium, MD;  Location: [facility] ENDOSCOPY;  Service: Endoscopy;  Laterality: N/A;   ENTEROSCOPY N/A 07/18/2020    Procedure: ENTEROSCOPY;  Surgeon: Toney Reil, MD;  Location: [facility] ENDOSCOPY;  Service: Gastroenterology;   Laterality: N/A;   ENTEROSCOPY N/A 04/07/2023    Procedure: ENTEROSCOPY;  Surgeon: Toney Reil, MD;  Location: [facility] ENDOSCOPY;  Service: Gastroenterology;  Laterality: N/A;  push   ESOPHAGOGASTRODUODENOSCOPY N/A 07/30/2017    Procedure: ESOPHAGOGASTRODUODENOSCOPY (EGD);  Surgeon: Toney Reil, MD;  Location: [facility] ENDOSCOPY;  Service: Gastroenterology;  Laterality: N/A;   ESOPHAGOGASTRODUODENOSCOPY (EGD) WITH PROPOFOL N/A 04/09/2017    Procedure: ESOPHAGOGASTRODUODENOSCOPY (EGD) WITH PROPOFOL;  Surgeon: Midge Minium, MD;  Location: [facility] ENDOSCOPY;  Service: Endoscopy;  Laterality: N/A;   ESOPHAGOGASTRODUODENOSCOPY (EGD) WITH PROPOFOL N/A 10/12/2022    Procedure: ESOPHAGOGASTRODUODENOSCOPY (EGD) WITH PROPOFOL;  Surgeon: Toney Reil, MD;  Location: [facility] ENDOSCOPY;  Service: Gastroenterology;  Laterality: N/A;   GIVENS CAPSULE STUDY N/A 10/08/2019    Procedure: GIVENS CAPSULE STUDY;  Surgeon: Midge Minium, MD;  Location: [facility] ENDOSCOPY;  Service: Endoscopy;  Laterality: N/A;   HIP FRACTURE SURGERY       LEFT HEART CATH AND CORONARY ANGIOGRAPHY   10/2009    [facility]: EF 85%;  40% prox &mid RCA -> 80% RPDA and AMrg, 60% RPAV.  40% prox & distal LAD w. 60% dLAD and D2, - Med Rx   LOWER EXTREMITY ARTERIOGRAM Bilateral 03/28/2011    ([facility]) RLE: moderate focal prox. R> CIA stenosis, patent PFA, SFA, CFA, 2 vessel runoff via PT/peroneal, AT proximally occluded. LLE: moderate CFA stenosis, patent PFA, SFA and PA, 2 vessel runoff via AT/peroneal, PT occluded.   NM GATED MYOVIEW ([facility] HX)   08/2018    Small sized mild severity defect in the apical anterior wall concerning for ischemia.  INTERMEDIATE RISK. (Appears to be no change from 2009)   NM MYOVIEW LTD   07/2008    ([facility]): A) 07/2008: Minimal reversible defect in the apex with possible ischemia.  Likely represents artifact.  EF 59% -> Cath: 80% PDA, 60% PAV and distal LAD dZ); B) 08/2010: Lexiscan - > ischemia the Ant &  Inf Apex.  EF 64%.(Med Rx); C) Jan 2013: + Apical Ischemia - unchanged; D) Jan 2016: EF 76%. No Ischemia /Infarct.   TRANSTHORACIC ECHOCARDIOGRAM   11/2009    [facility]) A) December 2010, 2D echo: EF >55%, mild concentric LVH, diastolic dysfunction Gr. I, LVIDd 4.2 cm, LVIDs 2.3 cm, mild TR (peak RVP 28 mmHg).; B) November 2011, 2D echo: EF >55%, mild concentric LVH, diastolic dysfunction Gr. I, LVIDd 4.3 cm, LVIDs 1.7 cm, mild PR (peak RVP 25 mmHg).; C) 12/2014 Echo: EF >55%, mild LVH, DD gr. I, LAE 3.9cm, Moderate TR (PRVP 35 mmHg)   TRANSTHORACIC ECHOCARDIOGRAM   06/13/2020    [facility]: EF 60-65%.  Normal LV function.  Indeterminate diastolic pressures.  Mild RV enlargement with severely elevated PAP estimated 95. moderate biatrial dilation.  Moderate to severe MR.  Moderate to severe TR.  Mild AS.        Social History         Socioeconomic History   Marital status: Widowed      Spouse name: Not on file   Number of children: 3   Years of education: Not on file   Highest education level: Not on file  Occupational History   Not on file  Tobacco Use   Smoking status: Every Day      Current packs/day: 1.00      Average packs/day: 1 pack/day for 46.0 years (46.0 ttl pk-yrs)      Types: Cigarettes   Smokeless tobacco: Never   Tobacco comments:      0.5PPD 02/17/2022  Vaping Use   Vaping status: Never Used  Substance and Sexual Activity   Alcohol use: No   Drug use: Yes      Types: Marijuana      Comment: occasional   Sexual activity: Not Currently  Other Topics Concern   Not on file  Social History Narrative    She is a widowed mother of at least 3 daughters.  Unfortunately all 3 daughters have deceased.           2015: Her youngest daughter and son-in-law were run over by an [place] train    04-Nov-2017: Middle daughter died of complications of DKA    May 05, 2021: Oldest daughter died cancer after being on palliative care.    She also had a dog that died after being  with her for 18 years.    Social Determinants of Health        Financial Resource Strain: Low Risk  (06/03/2022)    Received from [facility], [facility]    Overall Financial Resource Strain (CARDIA)     Difficulty of Paying Living Expenses: Not hard at all  Food Insecurity: No Food Insecurity (10/10/2022)    Hunger Vital Sign     Worried About Running Out of Food in the Last Year: Never true     Ran Out of Food in the Last Year: Never true  Transportation Needs: No Transportation Needs (10/10/2022)    PRAPARE - Therapist, art (Medical): No     Lack of Transportation (Non-Medical): No  Physical Activity: Inactive (11/09/2020)    Received from [facility], [facility]    Exercise Vital Sign     Days of Exercise per Week: 0 days     Minutes of Exercise per Session: 0 min  Stress: Stress Concern Present (11/09/2020)    Received from [facility], [facility]    [employer] of Occupational Health - Occupational Stress Questionnaire     Feeling of Stress : Rather much  Social Connections: Socially Isolated (11/09/2020)    Received from [facility], [facility]    Social Connection and Isolation Panel [NHANES]     Frequency of Communication with Friends and Family: Three times a week     Frequency of Social Gatherings with Friends and Family: Never     Attends Religious Services: Never     Database administrator or Organizations: No     Attends Banker Meetings: Never     Marital Status: Widowed         Family History  Problem Relation Age of Onset   Heart failure Mother  Heart attack Mother          mother died at 56 of an MI   Heart attack Father          father died at 73 of an MI   Heart attack Brother          Brother had an MI in his 74s        Allergies      Allergies  Allergen  Reactions   Chantix [Varenicline] Hives, Shortness Of Breath and Palpitations   Diphenhydramine Hcl Shortness Of Breath   Benadryl [Diphenhydramine Hcl] Rash   Niacin Rash   Trazodone Palpitations            Current Outpatient Medications  Medication Sig Dispense Refill   albuterol (PROVENTIL) (2.5 MG/3ML) 0.083% nebulizer solution Take 3 mLs (2.5 mg total) by nebulization every 6 (six) hours as needed for wheezing or shortness of breath. 75 mL 0   alprazolam (XANAX) 2 MG tablet Take 2 mg by mouth 2 (two) times daily.        atorvastatin (LIPITOR) 80 MG tablet Take 80 mg by mouth daily.       busPIRone (BUSPAR) 10 MG tablet Take 10 mg by mouth 2 (two) times daily.    2   carvedilol (COREG) 6.25 MG tablet Take 6.25 mg by mouth 2 (two) times daily.       cephALEXin (KEFLEX) 500 MG capsule Take 1 capsule (500 mg total) by mouth 3 (three) times daily. 30 capsule 0   citalopram (CELEXA) 40 MG tablet Take 40 mg by mouth daily.       DULoxetine (CYMBALTA) 60 MG capsule Take 60 mg by mouth daily.       ezetimibe (ZETIA) 10 MG tablet Take 1 tablet (10 mg total) by mouth daily. 90 tablet 3   ferrous gluconate (FERGON) 324 MG tablet Take 1 tablet (324 mg total) by mouth daily with breakfast. 30 tablet 1   furosemide (LASIX) 20 MG tablet Take 1 tablet (20 mg total) by mouth daily. 90 tablet 1   gabapentin (NEURONTIN) 400 MG capsule Take 400 mg by mouth 3 (three) times daily.       ipratropium-albuterol (DUONEB) 0.5-2.5 (3) MG/3ML SOLN Take 3 mLs by nebulization every 6 (six) hours as needed. 360 mL 0   isosorbide mononitrate (IMDUR) 30 MG 24 hr tablet Take 1 tablet (30 mg total) by mouth daily. 30 tablet 6   lisinopril (ZESTRIL) 10 MG tablet Take 1 tablet (10 mg total) by mouth daily. 30 tablet 1   naproxen (NAPROSYN) 500 MG tablet Take 1 tablet (500 mg total) by mouth 2 (two) times daily with a meal. 20 tablet 2   nitroGLYCERIN (NITROSTAT) 0.4 MG SL tablet Place 0.4 mg under the tongue as needed.        oxyCODONE (OXY IR/ROXICODONE) 5 MG immediate release tablet Take 1-2 tablets (5-10 mg total) by mouth every 4 (four) hours as needed for moderate pain or severe pain. 15 tablet 0   oxyCODONE (ROXICODONE) 5 MG immediate release tablet Take 1 tablet (5 mg total) by mouth every 4 (four) hours as needed for severe pain (pain score 7-10). 5 tablet 0   oxyCODONE-acetaminophen (PERCOCET) 5-325 MG tablet Take 1 tablet by mouth every 4 (four) hours as needed for severe pain. 20 tablet 0   pantoprazole (PROTONIX) 40 MG tablet Take 1 tablet (40 mg total) by mouth daily. 30 tablet 1   SYMBICORT 80-4.5 MCG/ACT inhaler Inhale 2  puffs into the lungs 2 (two) times daily.       Vitamin D, Ergocalciferol, (DRISDOL) 1.25 MG (50000 UNIT) CAPS capsule Take by mouth.          No current facility-administered medications for this visit.             Facility-Administered Medications Ordered in Other Visits  Medication Dose Route Frequency Provider Last Rate Last Admin   alteplase (CATHFLO ACTIVASE) injection 2 mg  2 mg Intracatheter Once PRN Creig Hines, MD       heparin lock flush 100 unit/mL  500 Units Intracatheter Once PRN Creig Hines, MD       heparin lock flush 100 unit/mL  250 Units Intracatheter Once PRN Creig Hines, MD       sodium chloride flush (NS) 0.9 % injection 10 mL  10 mL Intracatheter Once PRN Creig Hines, MD       sodium chloride flush (NS) 0.9 % injection 3 mL  3 mL Intracatheter Once PRN Creig Hines, MD           Imaging Results (Last 48 hours)  DG Elbow Complete Left   Result Date: 09/23/2023 CLINICAL DATA:  Left elbow pain and swelling after fall. EXAM: LEFT ELBOW - COMPLETE 3+ VIEW COMPARISON:  None Available. FINDINGS: Severely displaced fracture is seen involving the olecranon. Visualized portions of the distal humerus and radius are unremarkable. IMPRESSION: Severely displaced olecranon fracture. Electronically Signed   By: Lupita Raider M.D.   On: 09/23/2023 15:53     DG Shoulder Left   Result Date: 09/23/2023 CLINICAL DATA:  Left shoulder pain after fall. EXAM: LEFT SHOULDER - 2+ VIEW COMPARISON:  June 24, 2023. FINDINGS: Old proximal left humeral neck fracture is again noted. No acute fracture or dislocation is noted. Mild degenerative changes seen involving the left acromioclavicular joint. IMPRESSION: Old proximal left humeral neck fracture. Electronically Signed   By: Lupita Raider M.D.   On: 09/23/2023 15:51       Review of Systems:   A ROS was performed including pertinent positives and negatives as documented in the HPI.   Physical Exam :   Constitutional: NAD and appears stated age Neurological: Alert and oriented Psych: Appropriate affect and cooperative There were no vitals taken for this visit.    Comprehensive Musculoskeletal Exam:     Splint is in place which is clean dry intact.  There is swelling of the fingers with 2 rings in place.  There is no ischemia to the digits.  No tenderness about the shoulder although there is some tenderness about the [facility] joint.  Her splint is clean dry intact  Lungs CTAB  No MGR   Imaging:   Xray (3 views left elbow): Displaced left olecranon fracture       I personally reviewed and interpreted the radiographs.     Assessment:   67 y.o. female with a displaced left olecranon fracture after a fall while feeding her cat.  Overall I did discuss that given the displacement profile I would recommend open reduction internal fixation in order to allow for optimal extension of the elbow.  I did discuss that overall she does have quite obvious osteopenia on x-ray and given her multiple fragility fractures that we would recommend treatment in the future for this.  Will plan to begin with open reduction internal fixation of the left elbow   Plan :     -Plan for left elbow  olecranon open reduction internal fixation     After a lengthy discussion of treatment options, including risks, benefits,  alternatives, complications of surgical and nonsurgical conservative options, the patient elected surgical repair.    The patient  is aware of the material risks  and complications including, but not limited to injury to adjacent structures, neurovascular injury, infection, numbness, bleeding, implant failure, thermal burns, stiffness, persistent pain, failure to heal, disease transmission from allograft, need for further surgery, dislocation, anesthetic risks, blood clots, risks of death,and others. The probabilities of surgical success and failure discussed with patient given their particular co-morbidities.The time and nature of expected rehabilitation and recovery was discussed.The patient's questions were all answered preoperatively.  No barriers to understanding were noted. I explained the natural history of the disease process and Rx rationale.  I explained to the patient what I considered to be reasonable expectations given their personal situation.  The final treatment plan was arrived at through a shared patient decision making process model.           I personally saw and evaluated the patient, and participated in the management and treatment plan.   Huel Cote, MD Attending Physician, Orthopedic Surgery

## 2023-09-28 NOTE — Anesthesia Postprocedure Evaluation (Signed)
Anesthesia Post Note  Patient: Traci Duran  Procedure(s) Performed: LEFT Open Reduction Internal fixation (ORIF), fracture, elbow/olecranon (Left: Elbow)  Anesthesia Type: Regional Anesthetic complications: no   No notable events documented.   Last Vitals:  Vitals:   09/28/23 1100 09/28/23 1347  BP: (!) 199/98 (!) 135/96  Pulse: 77 77  Resp: 17 (!) 21  Temp:  36.6 C  SpO2: 98% 100%    Last Pain:  Vitals:   09/28/23 1347  TempSrc:   PainSc: Asleep                 Ginger Carne

## 2023-09-28 NOTE — Discharge Instructions (Addendum)
Discharge Instructions    Attending Surgeon: Huel Cote, MD Office Phone Number: [phone]   Diagnosis and Procedures:    Surgeries Performed: Left olecranon fixation  Discharge Plan:    Diet: Resume usual diet. Begin with light or bland foods.  Drink plenty of fluids.  Activity:  Keep sling and dressing in place until your follow up visit in Physical Therapy You are advised to go home directly from the hospital or surgical center. Restrict your activities.  GENERAL INSTRUCTIONS: 1.  Keep your surgical site elevated above your heart for at least 5-7 days or longer to prevent swelling. This will improve your comfort and your overall recovery following surgery.     2. Please call Dr. Serena Croissant office at [phone] with questions Monday-Friday during business hours. If no one answers, please leave a message and someone should get back to the patient within 24 hours. For emergencies please call 911 or proceed to the emergency room.   3. Patient to notify surgical team if experiences any of the following: Bowel/Bladder dysfunction, uncontrolled pain, nerve/muscle weakness, incision with increased drainage or redness, nausea/vomiting and Fever greater than 101.0 F.  Be alert for signs of infection including redness, streaking, odor, fever or chills. Be alert for excessive pain or bleeding and notify your surgeon immediately.  WOUND INSTRUCTIONS:   Leave your dressing/cast/splint in place until your post operative visit.  Keep it clean and dry.  Always keep the incision clean and dry until the staples/sutures are removed. If there is no drainage from the incision you should keep it open to air. If there is drainage from the incision you must keep it covered at all times until the drainage stops  Do not soak in a bath tub, hot tub, pool, lake or other body of water until 21 days after your surgery and your incision is completely dry and healed.  If you have removable  sutures (or staples) they must be removed 10-14 days (unless otherwise instructed) from the day of your surgery.     1)  Elevate the extremity as much as possible.  2)  Keep the dressing clean and dry.  3)  Please call us if the dressing becomes wet or dirty.  4)  If you are experiencing worsening pain or worsening swelling, please call.     MEDICATIONS: Resume all previous home medications at the previous prescribed dose and frequency unless otherwise noted Start taking the  pain medications on an as-needed basis as prescribed  Please taper down pain medication over the next week following surgery.  Ideally you should not require a refill of any narcotic pain medication.  Take pain medication with food to minimize nausea. In addition to the prescribed pain medication, you may take over-the-counter pain relievers such as Tylenol.  Do NOT take additional tylenol if your pain medication already has tylenol in it.  Aspirin 325mg  daily for four weeks.      FOLLOWUP INSTRUCTIONS: 1. Follow up at the Physical Therapy Clinic 3-4 days following surgery. This appointment should be scheduled unless other arrangements have been made.The Physical Therapy scheduling number is [phone] if an appointment has not already been arranged.  2. Contact Dr. Serena Croissant office during office hours at [phone] or the practice after hours line at [phone] for non-emergencies. For medical emergencies call 911.   Discharge Location: Home AMBULATORY SURGERY  DISCHARGE INSTRUCTIONS   The drugs that you were given will stay in your system until tomorrow so for  the next 24 hours you should not:  Drive an automobile Make any legal decisions Drink any alcoholic beverage   You may resume regular meals tomorrow.  Today it is better to start with liquids and gradually work up to solid foods.  You may eat anything you prefer, but it is better to start with liquids, then soup and crackers, and gradually  work up to solid foods.   Please notify your doctor immediately if you have any unusual bleeding, trouble breathing, redness and pain at the surgery site, drainage, fever, or pain not relieved by medication.    Additional Instructions:        Please contact your physician with any problems or Same Day Surgery at [phone], Monday through Friday 6 am to 4 pm, or [place] at [facility] number at [phone].

## 2023-09-28 NOTE — Anesthesia Procedure Notes (Signed)
Date/Time: 09/28/2023 11:29 AM  Performed by: Ginger Carne, CRNAPre-anesthesia Checklist: Patient identified, Emergency Drugs available, Patient being monitored, Suction available and Timeout performed Patient Re-evaluated:Patient Re-evaluated prior to induction Oxygen Delivery Method: Simple face mask Preoxygenation: Pre-oxygenation with 100% oxygen Induction Type: IV induction

## 2023-09-28 NOTE — Transfer of Care (Signed)
Immediate Anesthesia Transfer of Care Note  Patient: Traci Duran  Procedure(s) Performed: LEFT Open Reduction Internal fixation (ORIF), fracture, elbow/olecranon (Left: Elbow)  Patient Location: PACU  Anesthesia Type:General  Level of Consciousness: awake, alert , and oriented  Airway & Oxygen Therapy: Patient Spontanous Breathing and Patient connected to face mask oxygen  Post-op Assessment: Report given to RN and Post -op Vital signs reviewed and stable  Post vital signs: Reviewed and stable  Last Vitals:  Vitals Value Taken Time  BP 135/96 09/28/23 1347  Temp 36.6 C 09/28/23 1347  Pulse 77 09/28/23 1354  Resp 22 09/28/23 1354  SpO2 100 % 09/28/23 1354  Vitals shown include unfiled device data.  Last Pain:  Vitals:   09/28/23 1347  TempSrc:   PainSc: Asleep         Complications: No notable events documented.

## 2023-09-29 ENCOUNTER — Telehealth (HOSPITAL_BASED_OUTPATIENT_CLINIC_OR_DEPARTMENT_OTHER): Payer: Self-pay

## 2023-09-29 ENCOUNTER — Encounter: Payer: Self-pay | Admitting: Orthopaedic Surgery

## 2023-09-29 NOTE — Telephone Encounter (Signed)
Pt called wanting to know what she is suppose to start PT. Her discharge paperwork says 3-4 days but she feels this is too early. Please advise

## 2023-09-29 NOTE — Telephone Encounter (Signed)
Spoke to patient's family member who answered phone stating patient does not need PT until 2 week follow up 11/7

## 2023-09-30 NOTE — Plan of Care (Signed)
[facility] Tonsillectomy/Adenoidectomy, Postoperative PEDS care plan entered in error.

## 2023-10-01 ENCOUNTER — Ambulatory Visit: Payer: 59 | Admitting: Cardiology

## 2023-10-05 ENCOUNTER — Inpatient Hospital Stay: Payer: 59 | Attending: Nurse Practitioner

## 2023-10-12 ENCOUNTER — Other Ambulatory Visit (HOSPITAL_BASED_OUTPATIENT_CLINIC_OR_DEPARTMENT_OTHER): Payer: Self-pay | Admitting: Orthopaedic Surgery

## 2023-10-12 DIAGNOSIS — S52021A Displaced fracture of olecranon process without intraarticular extension of right ulna, initial encounter for closed fracture: Secondary | ICD-10-CM

## 2023-10-14 ENCOUNTER — Encounter (HOSPITAL_BASED_OUTPATIENT_CLINIC_OR_DEPARTMENT_OTHER): Payer: 59 | Admitting: Orthopaedic Surgery

## 2023-10-15 ENCOUNTER — Telehealth: Payer: Self-pay | Admitting: Orthopaedic Surgery

## 2023-10-15 ENCOUNTER — Ambulatory Visit: Payer: 59 | Admitting: Orthopaedic Surgery

## 2023-10-15 ENCOUNTER — Other Ambulatory Visit (HOSPITAL_BASED_OUTPATIENT_CLINIC_OR_DEPARTMENT_OTHER): Payer: Self-pay | Admitting: Student

## 2023-10-15 ENCOUNTER — Telehealth (HOSPITAL_BASED_OUTPATIENT_CLINIC_OR_DEPARTMENT_OTHER): Payer: Self-pay | Admitting: Orthopaedic Surgery

## 2023-10-15 DIAGNOSIS — S52021A Displaced fracture of olecranon process without intraarticular extension of right ulna, initial encounter for closed fracture: Secondary | ICD-10-CM

## 2023-10-15 MED ORDER — OXYCODONE HCL 5 MG PO TABS: 5.0000 mg | ORAL_TABLET | Freq: Four times a day (QID) | ORAL | 0 refills | Status: DC | PRN

## 2023-10-15 MED ORDER — OXYCODONE HCL 5 MG PO TABS: 5.0000 mg | ORAL_TABLET | ORAL | 0 refills | Status: DC | PRN

## 2023-10-15 NOTE — Telephone Encounter (Signed)
Pt called in advising she needs her Oxycodone sent to the [facility] on [address] at [address] please advise

## 2023-10-15 NOTE — Telephone Encounter (Signed)
Pt called stating Dr Steward Drone sent her prescription to wrong pharmacy. Please resend pt [facility] [address] [place]. Pt phone number is [phone].

## 2023-10-15 NOTE — Telephone Encounter (Signed)
Double message

## 2023-10-15 NOTE — Addendum Note (Signed)
Addended by: Benancio Deeds on: 10/15/2023 10:38 AM   Modules accepted: Orders

## 2023-10-15 NOTE — Progress Notes (Signed)
Post Operative Evaluation    Procedure/Date of Surgery: Left olecranon open reduction internal fixation 10/21  Interval History:    Presents today 2 weeks status post the above procedure.  Overall she is doing extremely well.  Denies any wound drainage or issues with the wound.  She has been compliant with brace usage.  She is here today for there is a discussion   PMH/PSH/Family History/Social History/Meds/Allergies:    Past Medical History:  Diagnosis Date   Anemia 05/2020   Thought to be related to GI bleed.   Anxiety    Bilateral carotid artery stenosis without cerebral infarction 10/2010    November 2011, CTA of the head and neck: Near complete occlusion versus severe stenosis of nearly the entire basilar artery, complete occlusion of the right vertebral artery from its origin off the right SCA, moderate noncalcified atherosclerotic in the proximal left SCA which results in a 40% stenosis. Right ICA with approximately 15% stenosis, left   Nov. 2012 Carotid Duplex: >75% diameter re   CHF (congestive heart failure), chronic HFrEF NYHA class II, chronic, diastolic (HCC)    Chronic HFrEF   COPD (chronic obstructive pulmonary disease) (HCC)    Coronary artery disease, non-occlusive 10/2009   Cardiac Cath: EF 85%;  40% prox &mid RCA -> 80% RPDA and AMrg, 60% RPAV.  40% prox & distal LAD w. 60% dLAD and D2, - Med Rx; Myoview 9/19: Small size, Mod severity Apical Anterior defect  c/w ISCHEMIA. -> INTERMEDIATE RISK (med Rx because of anemia, and known distal LAD and PDA disease. ->  Similar defect noted in August 2009)   Depression    Gastritis    GERD (gastroesophageal reflux disease)    Hemorrhoids    Hyperlipidemia    Hypertension    Mild aortic stenosis by prior echocardiogram 06/2020   Mild aortic stenosis by echo   Moderate to severe mitral regurgitation 06/2020   Echo at [facility]: Moderate-severe MR with mild RV enlargement and severely elevated  PA 95 mmHg.   Myocardial infarction [facility])    Demand ischemia infarction initially in 2010   PAD (peripheral artery disease) (HCC) 07/2008   a) 8/'09: normal ABIs; b) post procedure Dopplers December 2010: Suggestion of left PFA/CFA-SFV AV fistula.; c) 02/2010: Left CFA-PFA AV fistula still present.; d) 04/2010: CTA Abd/Pelvis- LE Runoff: Extensive Mixed plaque in the Abd Ao.  Patent visceral As.  Bilat 2V runnoff-feet ATA & Peroneal A;  (confirmed by Arteriogram 03/2011); e) ABIs October 2017 R ABI 0.88, L ABI 0.79.   Pulmonary hypertension (HCC) 06/2020   [facility] echo shows moderate-severe MR, moderate to severe TR with RVP estimated 95 mmHg.   Stenosis of left subclavian artery (HCC) 10/27/2010   ~75% by recent doppler -- CHECK BP ON R ARM (or bilaterally for confirmation)   Past Surgical History:  Procedure Laterality Date   APPENDECTOMY     COLONOSCOPY WITH PROPOFOL N/A 06/09/2017   Procedure: COLONOSCOPY WITH PROPOFOL;  Surgeon: Midge Minium, MD;  Location: [facility] ENDOSCOPY;  Service: Endoscopy;  Laterality: N/A;   COLONOSCOPY WITH PROPOFOL N/A 10/08/2019   Procedure: COLONOSCOPY WITH PROPOFOL;  Surgeon: Midge Minium, MD;  Location: [facility] ENDOSCOPY;  Service: Endoscopy;  Laterality: N/A;   COLONOSCOPY WITH PROPOFOL N/A 10/12/2022   Procedure: COLONOSCOPY WITH PROPOFOL;  Surgeon: Toney Reil, MD;  Location: [facility] ENDOSCOPY;  Service: Gastroenterology;  Laterality: N/A;   ENTEROSCOPY N/A 10/08/2019   Procedure: ENTEROSCOPY;  Surgeon: Midge Minium, MD;  Location: [facility] ENDOSCOPY;  Service: Endoscopy;  Laterality: N/A;   ENTEROSCOPY N/A 07/18/2020   Procedure: ENTEROSCOPY;  Surgeon: Toney Reil, MD;  Location: [facility] ENDOSCOPY;  Service: Gastroenterology;  Laterality: N/A;   ENTEROSCOPY N/A 04/07/2023   Procedure: ENTEROSCOPY;  Surgeon: Toney Reil, MD;  Location: [facility] ENDOSCOPY;  Service: Gastroenterology;  Laterality: N/A;  push   ESOPHAGOGASTRODUODENOSCOPY N/A 07/30/2017    Procedure: ESOPHAGOGASTRODUODENOSCOPY (EGD);  Surgeon: Toney Reil, MD;  Location: [facility] ENDOSCOPY;  Service: Gastroenterology;  Laterality: N/A;   ESOPHAGOGASTRODUODENOSCOPY (EGD) WITH PROPOFOL N/A 04/09/2017   Procedure: ESOPHAGOGASTRODUODENOSCOPY (EGD) WITH PROPOFOL;  Surgeon: Midge Minium, MD;  Location: [facility] ENDOSCOPY;  Service: Endoscopy;  Laterality: N/A;   ESOPHAGOGASTRODUODENOSCOPY (EGD) WITH PROPOFOL N/A 10/12/2022   Procedure: ESOPHAGOGASTRODUODENOSCOPY (EGD) WITH PROPOFOL;  Surgeon: Toney Reil, MD;  Location: [facility] ENDOSCOPY;  Service: Gastroenterology;  Laterality: N/A;   GIVENS CAPSULE STUDY N/A 10/08/2019   Procedure: GIVENS CAPSULE STUDY;  Surgeon: Midge Minium, MD;  Location: [facility] ENDOSCOPY;  Service: Endoscopy;  Laterality: N/A;   HIP FRACTURE SURGERY     LEFT HEART CATH AND CORONARY ANGIOGRAPHY  10/2009   [facility]: EF 85%;  40% prox &mid RCA -> 80% RPDA and AMrg, 60% RPAV.  40% prox & distal LAD w. 60% dLAD and D2, - Med Rx   LOWER EXTREMITY ARTERIOGRAM Bilateral 03/28/2011   ([facility]) RLE: moderate focal prox. R> CIA stenosis, patent PFA, SFA, CFA, 2 vessel runoff via PT/peroneal, AT proximally occluded. LLE: moderate CFA stenosis, patent PFA, SFA and PA, 2 vessel runoff via AT/peroneal, PT occluded.   NM GATED MYOVIEW ([facility] HX)  08/2018   Small sized mild severity defect in the apical anterior wall concerning for ischemia.  INTERMEDIATE RISK. (Appears to be no change from 2009)   NM MYOVIEW LTD  07/2008   ([facility]): A) 07/2008: Minimal reversible defect in the apex with possible ischemia.  Likely represents artifact.  EF 59% -> Cath: 80% PDA, 60% PAV and distal LAD dZ); B) 08/2010: Lexiscan - > ischemia the Ant & Inf Apex.  EF 64%.(Med Rx); C) Jan 2013: + Apical Ischemia - unchanged; D) Jan 2016: EF 76%. No Ischemia /Infarct.   ORIF ELBOW FRACTURE Left 09/28/2023   Procedure: LEFT Open Reduction Internal fixation (ORIF), fracture, elbow/olecranon;   Surgeon: Huel Cote, MD;  Location: [facility] ORS;  Service: Orthopedics;  Laterality: Left;   TRANSTHORACIC ECHOCARDIOGRAM  11/2009   ([facility]) A) December 2010, 2D echo: EF >55%, mild concentric LVH, diastolic dysfunction Gr. I, LVIDd 4.2 cm, LVIDs 2.3 cm, mild TR (peak RVP 28 mmHg).; B) November 2011, 2D echo: EF >55%, mild concentric LVH, diastolic dysfunction Gr. I, LVIDd 4.3 cm, LVIDs 1.7 cm, mild PR (peak RVP 25 mmHg).; C) 12/2014 Echo: EF >55%, mild LVH, DD gr. I, LAE 3.9cm, Moderate TR (PRVP 35 mmHg)   TRANSTHORACIC ECHOCARDIOGRAM  06/13/2020   [facility]: EF 60-65%.  Normal LV function.  Indeterminate diastolic pressures.  Mild RV enlargement with severely elevated PAP estimated 95. moderate biatrial dilation.  Moderate to severe MR.  Moderate to severe TR.  Mild AS.   Social History   Socioeconomic History   Marital status: Widowed    Spouse name: Not on file   Number of children: 3   Years of education: Not on file   Highest education level: Not on file  Occupational History   Not on file  Tobacco Use   Smoking status: Every Day    Current packs/day: 1.00    Average packs/day: 1 pack/day for 46.0 years (46.0 ttl pk-yrs)    Types: Cigarettes   Smokeless tobacco: Never   Tobacco comments:    0.5PPD 02/17/2022  Vaping Use   Vaping status: Never Used  Substance and Sexual Activity   Alcohol use: No   Drug use: Yes    Types: Marijuana    Comment: occasional   Sexual activity: Not Currently  Other Topics Concern   Not on file  Social History Narrative   She is a widowed mother of at least 3 daughters.  Unfortunately all 3 daughters have deceased.        2015: Her youngest daughter and son-in-law were run over by an [place] train   November 02, 2017: Middle daughter died of complications of DKA   04/02/2021: Oldest daughter died cancer after being on palliative care.   She also had a dog that died after being with her for 18 years.   Social Determinants of Health    Financial Resource Strain: Low Risk  (06/03/2022)   Received from [facility], [facility]   Overall Financial Resource Strain (CARDIA)    Difficulty of Paying Living Expenses: Not hard at all  Food Insecurity: No Food Insecurity (10/10/2022)   Hunger Vital Sign    Worried About Running Out of Food in the Last Year: Never true    Ran Out of Food in the Last Year: Never true  Transportation Needs: No Transportation Needs (10/10/2022)   PRAPARE - Administrator, Civil Service (Medical): No    Lack of Transportation (Non-Medical): No  Physical Activity: Inactive (11/09/2020)   Received from [facility], [facility]   Exercise Vital Sign    Days of Exercise per Week: 0 days    Minutes of Exercise per Session: 0 min  Stress: Stress Concern Present (11/09/2020)   Received from [facility], [facility]   [employer] of Occupational Health - Occupational Stress Questionnaire    Feeling of Stress : Rather much  Social Connections: Socially Isolated (11/09/2020)   Received from [facility], [facility]   Social Connection and Isolation Panel [NHANES]    Frequency of Communication with Friends and Family: Three times a week    Frequency of Social Gatherings with Friends and Family: Never    Attends Religious Services: Never    Database administrator or Organizations: No    Attends Banker Meetings: Never    Marital Status: Widowed   Family History  Problem Relation Age of Onset   Heart failure Mother    Heart attack Mother        mother died at 83 of an MI   Heart attack Father        father died at 51 of an MI   Heart attack Brother        Brother had an MI in his 44s   Allergies  Allergen Reactions   Chantix [Varenicline] Hives, Shortness Of Breath and Palpitations   Diphenhydramine Hcl Shortness Of Breath    Benadryl [Diphenhydramine Hcl] Rash   Niacin Rash   Trazodone Palpitations   Current Outpatient Medications  Medication Sig Dispense Refill   albuterol (PROVENTIL) (2.5 MG/3ML) 0.083% nebulizer solution Take 3 mLs (2.5 mg total)  by nebulization every 6 (six) hours as needed for wheezing or shortness of breath. 75 mL 0   alprazolam (XANAX) 2 MG tablet Take 2 mg by mouth 2 (two) times daily.      atorvastatin (LIPITOR) 80 MG tablet Take 80 mg by mouth daily.     busPIRone (BUSPAR) 10 MG tablet Take 10 mg by mouth 2 (two) times daily.   2   carvedilol (COREG) 6.25 MG tablet Take 6.25 mg by mouth 2 (two) times daily.     cephALEXin (KEFLEX) 500 MG capsule Take 1 capsule (500 mg total) by mouth 3 (three) times daily. (Patient not taking: Reported on 09/28/2023) 30 capsule 0   citalopram (CELEXA) 40 MG tablet Take 40 mg by mouth daily.     DULoxetine (CYMBALTA) 60 MG capsule Take 60 mg by mouth daily.     ezetimibe (ZETIA) 10 MG tablet Take 1 tablet (10 mg total) by mouth daily. 90 tablet 3   ferrous gluconate (FERGON) 324 MG tablet Take 1 tablet (324 mg total) by mouth daily with breakfast. 30 tablet 1   furosemide (LASIX) 20 MG tablet Take 1 tablet (20 mg total) by mouth daily. 90 tablet 1   gabapentin (NEURONTIN) 400 MG capsule Take 400 mg by mouth 3 (three) times daily.     ipratropium-albuterol (DUONEB) 0.5-2.5 (3) MG/3ML SOLN Take 3 mLs by nebulization every 6 (six) hours as needed. 360 mL 0   isosorbide mononitrate (IMDUR) 30 MG 24 hr tablet Take 1 tablet (30 mg total) by mouth daily. 30 tablet 6   lisinopril (ZESTRIL) 10 MG tablet Take 1 tablet (10 mg total) by mouth daily. 30 tablet 1   naproxen (NAPROSYN) 500 MG tablet Take 1 tablet (500 mg total) by mouth 2 (two) times daily with a meal. 20 tablet 2   nitroGLYCERIN (NITROSTAT) 0.4 MG SL tablet Place 0.4 mg under the tongue as needed.     oxyCODONE (OXY IR/ROXICODONE) 5 MG immediate release tablet Take 1-2 tablets (5-10 mg total) by mouth  every 4 (four) hours as needed for moderate pain (pain score 4-6) or severe pain (pain score 7-10). 15 tablet 0   pantoprazole (PROTONIX) 40 MG tablet Take 1 tablet (40 mg total) by mouth daily. 30 tablet 1   SYMBICORT 80-4.5 MCG/ACT inhaler Inhale 2 puffs into the lungs 2 (two) times daily.     Vitamin D, Ergocalciferol, (DRISDOL) 1.25 MG (50000 UNIT) CAPS capsule Take by mouth.     No current facility-administered medications for this visit.   Facility-Administered Medications Ordered in Other Visits  Medication Dose Route Frequency Provider Last Rate Last Admin   alteplase (CATHFLO ACTIVASE) injection 2 mg  2 mg Intracatheter Once PRN Creig Hines, MD       heparin lock flush 100 unit/mL  500 Units Intracatheter Once PRN Creig Hines, MD       heparin lock flush 100 unit/mL  250 Units Intracatheter Once PRN Creig Hines, MD       sodium chloride flush (NS) 0.9 % injection 10 mL  10 mL Intracatheter Once PRN Creig Hines, MD       sodium chloride flush (NS) 0.9 % injection 3 mL  3 mL Intracatheter Once PRN Creig Hines, MD       No results found.  Review of Systems:   A ROS was performed including pertinent positives and negatives as documented in the HPI.   Musculoskeletal Exam:    There were no  vitals taken for this visit.  Left elbow is well-appearing wound is without erythema or drainage.  Range of motion is from 20 to 60 degrees.  Sensation is intact distally.  Makes a strong fist as well as fires wrist extensors  Imaging:      I personally reviewed and interpreted the radiographs.   Assessment:   2-week status post left elbow olecranon open reduction internal fixation doing extremely well.  At this time I will plan to allow her active range of motion as tolerated.  She will still not lift anything greater than 5 pounds.  I will plan to see her back in 4 weeks with x-rays at that time  Plan :    -Return to clinic in 4 weeks for reassessment      I  personally saw and evaluated the patient, and participated in the management and treatment plan.  Huel Cote, MD Attending Physician, Orthopedic Surgery  This document was dictated using Dragon voice recognition software. A reasonable attempt at proof reading has been made to minimize errors.

## 2023-10-15 NOTE — Telephone Encounter (Signed)
[facility] patient oxycodone [facility] [address]  [facility] [place]

## 2023-10-15 NOTE — Telephone Encounter (Signed)
Lvm advising  

## 2023-10-16 ENCOUNTER — Other Ambulatory Visit (HOSPITAL_BASED_OUTPATIENT_CLINIC_OR_DEPARTMENT_OTHER): Payer: Self-pay | Admitting: Student

## 2023-10-16 ENCOUNTER — Telehealth: Payer: Self-pay | Admitting: Orthopaedic Surgery

## 2023-10-16 MED ORDER — OXYCODONE HCL 5 MG PO TABS: 5.0000 mg | ORAL_TABLET | ORAL | 0 refills | Status: DC | PRN

## 2023-10-16 NOTE — Telephone Encounter (Signed)
Advised pt

## 2023-10-16 NOTE — Telephone Encounter (Signed)
Patient called advised [facility] [address] and [facility] Patient asked if the Rx for Oxycodone can be sent to the [facility] in [place] on [address]. Patient asked for a call when Rx is sent to the pharmacy.  The number to contact patient is [phone]

## 2023-10-16 NOTE — Progress Notes (Deleted)
Cardiology Clinic Note   Date: 10/16/2023 ID: Traci Duran, DOB Aug 05, 1956, MRN [id number]  Primary Cardiologist:  None  Patient Profile    Traci Duran is a 67 y.o. female who presents to the clinic today for ***    Past medical history significant for: Nonobstructive CAD. LHC 11/06/2009 (performed at [facility]): D2 60%.  Distal LAD 80%. Nuclear stress test 09/04/2018: There is a small defect of mild severity present in the apical anterior location consistent with ischemia.  No ST segment deviation noted during stress.  Intermediate risk study. Chronic HFpEF. Echo 11/05/2021: EF 60 to 65%.  No RWMA.  Moderate LVH.  Indeterminate diastolic parameters.  Normal RV function.  Mildly elevated PA pressure.  Mild LAE.  Rheumatic mitral valve, moderate MR, mild mitral stenosis with mean gradient 5 mmHg.  Mild to moderate TR.  Moderate aortic valve calcification.  Mild aortic valve stenosis with mean gradient 7 mmHg. Mitral/aortic stenosis. PAD. ABI 01/09/2022: Mild bilateral lower extremity arterial disease. Carotid artery stenosis/left subclavian artery stenosis. Carotid duplex 01/09/2022: Bilateral ICA 60 to 79% stenosis.  Right vertebral artery demonstrates no discernible flow.  Left subclavian artery flow was disturbed. Right upper extremity arterial ultrasound 01/09/2022: Mid ulnar stenosis, distal ulnar occlusion. Renal artery stenosis. Hypertension. Hyperlipidemia.*** Stroke. AVM of small bowel. GI bleed. COPD. GERD. Tobacco abuse.  In summary, patient was previously followed by [facility].  She was first by Dr. Azucena Cecil on 05/03/2020 after an ED visit for chest pain.  Her workup was unremarkable with normal EKG and troponin negative x 2.  Echo from June 2020 showed normal systolic function with moderate pulmonary hypertension and EF 55 to 60%.  Past angiography November 2010 showed distal LAD disease and disease in D2 otherwise nonobstructive CAD.  Nuclear stress  test from September 2019 showed ischemia however invasive workup not planned due to history of anemia/GI bleed.  Patient with history of GI bleeding requiring transfusion in 2020.  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  Last echo November 2022 showed normal biventricular function with mildly elevated PA pressure, mitral/aortic stenosis (details above).  She has a history of PAD with claudication for which she is followed by Dr. Gilda Crease and vascular surgery.  Last ABI February 2023 showed mild arterial disease bilaterally.     History of Present Illness    Traci Duran is followed by Dr. Azucena Cecil for the above outlined history.  Patient was last seen in the office by Dr. Azucena Cecil on 08/14/2022 for routine follow-up.  She reported fatigue but no chest pain.  She noted blood in her stool and was referred by primary care for colonoscopy.  Aspirin was stopped due to GI bleed.   Discussed the use of AI scribe software for clinical note transcription with the patient, who gave verbal consent to proceed.         Nonobstructive CAD LHC November 2010 showed D2 60%, distal LAD 80%.  Abnormal nuclear stress test September 2019 with no invasive workup secondary to GI bleeding/anemia.  Patient*** -Continue isosorbide, carvedilol, atorvastatin, Zetia, as needed SL NTG.  Not on aspirin secondary to GI bleeding.  Chronic HFpEF Echo November 2022 showed normal biventricular function, mildly elevated PA pressure, moderate MR, mild mitral stenosis, mild to moderate TR, mild aortic valve stenosis.  Patient*** Euvolemic and well compensated on exam. -Continue lisinopril, isosorbide, carvedilol, Lasix.  Hypertension BP today*** -Continue lisinopril, carvedilol.  Hyperlipidemia *** -  Continue atorvastatin and Zetia.  PAD ABI February 2023 showed mild bilateral lower extremity arterial disease.  Patient*** -Continue atorvastatin and  Zetia. -Continue to follow with vascular surgery.  Tobacco abuse Patient***    ROS: All other systems reviewed and are otherwise negative except as noted in History of Present Illness.  Studies Reviewed       ***  Risk Assessment/Calculations    {Does this patient have ATRIAL FIBRILLATION?:[id number]} No BP recorded.  {Refresh Note OR Click here to enter BP  :1}***        Physical Exam    VS:  There were no vitals taken for this visit. , BMI There is no height or weight on file to calculate BMI.  GEN: Well nourished, well developed, in no acute distress. Neck: No JVD or carotid bruits. Cardiac: *** RRR. No murmurs. No rubs or gallops.   Respiratory:  Respirations regular and unlabored. Clear to auscultation without rales, wheezing or rhonchi. GI: Soft, nontender, nondistended. Extremities: Radials/DP/PT 2+ and equal bilaterally. No clubbing or cyanosis. No edema ***  Skin: Warm and dry, no rash. Neuro: Strength intact.  Assessment & Plan   Assessment and Plan               Disposition: ***     {Are you ordering a CV Procedure (e.g. stress test, cath, DCCV, TEE, etc)?   Press F2        :[id number]}   Signed, Etta Grandchild. Lorilyn Laitinen, DNP, NP-C

## 2023-10-16 NOTE — Telephone Encounter (Signed)
Patient called and said she needs her pain medication. Can you send it to the [facility] on [address] In [place]. [phone]

## 2023-10-16 NOTE — Telephone Encounter (Signed)
Double message

## 2023-10-19 ENCOUNTER — Other Ambulatory Visit: Payer: Self-pay | Admitting: *Deleted

## 2023-10-19 ENCOUNTER — Ambulatory Visit: Payer: 59 | Attending: Cardiology | Admitting: Student

## 2023-10-19 MED ORDER — LISINOPRIL 10 MG PO TABS: 10.0000 mg | ORAL_TABLET | Freq: Every day | ORAL | 0 refills | Status: DC

## 2023-10-26 ENCOUNTER — Telehealth: Payer: Self-pay | Admitting: Cardiology

## 2023-10-26 NOTE — Telephone Encounter (Signed)
*  STAT* If patient is at the pharmacy, call can be transferred to refill team.   1. Which medications need to be refilled? (please list name of each medication and dose if known)   lisinopril (ZESTRIL) 10 MG tablet   2. Would you like to learn more about the convenience, safety, & potential cost savings by using the [facility]?   3. Are you open to using the [facility] (Type [facility]. ).   4. Which pharmacy/location (including street and city if local pharmacy) is medication to be sent to?  [facility] [place] - [address]   5. Do they need a 30 day or 90 day supply?   90 day  Caller Toniann Fail) stated patient is out of this medication.

## 2023-10-26 NOTE — Telephone Encounter (Addendum)
This call came from mail order pharmacy not the patient.    Call placed to patient to inquire need for refill.  If she needs a refill it will need to go to local pharmacy for short term refill since she is overdue yearly follow up.  Patient/grandson stated that she does not need a refill on Lisinopril at this time.

## 2023-10-26 NOTE — Telephone Encounter (Signed)
Last office visit 08/14/22 with plan to f/u in 12 months  next office visit: none/active recall  Please schedule f/u appt.  Thanks!

## 2023-10-27 NOTE — Telephone Encounter (Signed)
Scheduled 12/02

## 2023-10-30 ENCOUNTER — Telehealth: Payer: Self-pay | Admitting: Cardiology

## 2023-10-30 MED ORDER — LISINOPRIL 10 MG PO TABS: 10.0000 mg | ORAL_TABLET | Freq: Every day | ORAL | 0 refills | Status: DC

## 2023-10-30 NOTE — Telephone Encounter (Signed)
*  STAT* If patient is at the pharmacy, call can be transferred to refill team.   1. Which medications need to be refilled? (please list name of each medication and dose if known)   lisinopril (ZESTRIL) 10 MG tablet   2. Would you like to learn more about the convenience, safety, & potential cost savings by using the [facility]?   3. Are you open to using the [facility] ([facility]. ).  4. Which pharmacy/location (including street and city if local pharmacy) is medication to be sent to?  [facility] - [place] - [address]   5. Do they need a 30 day or 90 day supply?   90 day  Caller Kathlene November) stated they had not received the prescription for patient's refill and wants refill prescription re-sent.

## 2023-10-30 NOTE — Telephone Encounter (Signed)
Requested Prescriptions   Signed Prescriptions Disp Refills   lisinopril (ZESTRIL) 10 MG tablet 90 tablet 0    Sig: Take 1 tablet (10 mg total) by mouth daily.    Authorizing Provider: Antonieta Iba    Ordering User: Guerry Minors

## 2023-10-30 NOTE — Telephone Encounter (Signed)
No refill needed noted on 10/26/23 phone note.  Will send refill today.   last visit: 08/14/22 with plan to f/u in 12 months.  next visit:  11/09/23

## 2023-11-04 ENCOUNTER — Other Ambulatory Visit: Payer: Self-pay

## 2023-11-04 ENCOUNTER — Emergency Department
Admission: EM | Admit: 2023-11-04 | Discharge: 2023-11-04 | Discharge status: Against Medical Advice | Payer: 59 | Attending: Emergency Medicine | Admitting: Emergency Medicine

## 2023-11-04 DIAGNOSIS — Z5321 Procedure and treatment not carried out due to patient leaving prior to being seen by health care provider: Secondary | ICD-10-CM | POA: Diagnosis not present

## 2023-11-04 DIAGNOSIS — R42 Dizziness and giddiness: Secondary | ICD-10-CM | POA: Insufficient documentation

## 2023-11-04 DIAGNOSIS — R109 Unspecified abdominal pain: Secondary | ICD-10-CM | POA: Diagnosis not present

## 2023-11-04 DIAGNOSIS — K921 Melena: Secondary | ICD-10-CM | POA: Insufficient documentation

## 2023-11-04 LAB — CBC
HCT: 34.4 % — ABNORMAL LOW (ref 36.0–46.0)
Hemoglobin: 10.7 g/dL — ABNORMAL LOW (ref 12.0–15.0)
MCH: 27.4 pg (ref 26.0–34.0)
MCHC: 31.1 g/dL (ref 30.0–36.0)
MCV: 88.2 fL (ref 80.0–100.0)
Platelets: 277 10*3/uL (ref 150–400)
RBC: 3.9 MIL/uL (ref 3.87–5.11)
RDW: 14.3 % (ref 11.5–15.5)
WBC: 6 10*3/uL (ref 4.0–10.5)
nRBC: 0 % (ref 0.0–0.2)

## 2023-11-04 LAB — COMPREHENSIVE METABOLIC PANEL
ALT: 7 U/L (ref 0–44)
AST: 12 U/L — ABNORMAL LOW (ref 15–41)
Albumin: 3.5 g/dL (ref 3.5–5.0)
Alkaline Phosphatase: 101 U/L (ref 38–126)
Anion gap: 6 (ref 5–15)
BUN: 10 mg/dL (ref 8–23)
CO2: 23 mmol/L (ref 22–32)
Calcium: 8.8 mg/dL — ABNORMAL LOW (ref 8.9–10.3)
Chloride: 101 mmol/L (ref 98–111)
Creatinine, Ser: 1.19 mg/dL — ABNORMAL HIGH (ref 0.44–1.00)
GFR, Estimated: 50 mL/min — ABNORMAL LOW (ref >60)
Glucose, Bld: 54 mg/dL — ABNORMAL LOW (ref 70–99)
Potassium: 4.4 mmol/L (ref 3.5–5.1)
Sodium: 130 mmol/L — ABNORMAL LOW (ref 135–145)
Total Bilirubin: 0.3 mg/dL (ref <1.2)
Total Protein: 6.4 g/dL — ABNORMAL LOW (ref 6.5–8.1)

## 2023-11-04 LAB — TYPE AND SCREEN
ABO/RH(D): O POS
Antibody Screen: NEGATIVE

## 2023-11-04 NOTE — ED Triage Notes (Signed)
Pt arrives via POV. Pt reports since last Wednesday, she has noticed bright red blood every time she has had a bowel movement. Reports associated abdominal pain and dizziness as well. Pt AxOx4. No blood thinners.

## 2023-11-04 NOTE — Progress Notes (Unsigned)
Cardiology Clinic Note   Date: 11/04/2023 ID: ARDETH SIELING, DOB 1956/11/01, MRN [id number]  Primary Cardiologist:  Debbe Odea, MD  Patient Profile    Traci Duran is a 67 y.o. female who presents to the clinic today for ***    Past medical history significant for: Nonobstructive CAD. LHC 11/06/2009 (performed at [facility]): D2 60%.  Distal LAD 80%. Nuclear stress test 09/04/2018: There is a small defect of mild severity present in the apical anterior location consistent with ischemia.  No ST segment deviation noted during stress.  Intermediate risk study. Chronic HFpEF. Echo 11/05/2021: EF 60 to 65%.  No RWMA.  Moderate LVH.  Indeterminate diastolic parameters.  Normal RV function.  Mildly elevated PA pressure.  Mild LAE.  Rheumatic mitral valve, moderate MR, mild mitral stenosis with mean gradient 5 mmHg.  Mild to moderate TR.  Moderate aortic valve calcification.  Mild aortic valve stenosis with mean gradient 7 mmHg. Mitral/aortic stenosis. PAD. ABI 01/09/2022: Mild bilateral lower extremity arterial disease. Carotid artery stenosis/left subclavian artery stenosis. Carotid duplex 01/09/2022: Bilateral ICA 60 to 79% stenosis.  Right vertebral artery demonstrates no discernible flow.  Left subclavian artery flow was disturbed. Right upper extremity arterial ultrasound 01/09/2022: Mid ulnar stenosis, distal ulnar occlusion. Renal artery stenosis. Hypertension. Hyperlipidemia.*** Stroke. AVM of small bowel. GI bleed. COPD. GERD. Tobacco abuse.  In summary, patient was previously followed by [facility] health.  She was first by Dr. Azucena Cecil on 05/03/2020 after an ED visit for chest pain.  Her workup was unremarkable with normal EKG and troponin negative x 2.  Echo from June 2020 showed normal systolic function with moderate pulmonary hypertension and EF 55 to 60%.  Past angiography November 2010 showed distal LAD disease and disease in D2 otherwise nonobstructive  CAD.  Nuclear stress test from September 2019 showed ischemia however invasive workup not planned due to history of anemia/GI bleed.  Patient with history of GI bleeding requiring transfusion in 2020.  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  Last echo November 2022 showed normal biventricular function with mildly elevated PA pressure, mitral/aortic stenosis (details above).  She has a history of PAD with claudication for which she is followed by Dr. Gilda Crease and vascular surgery.  Last ABI February 2023 showed mild arterial disease bilaterally.  In summary, ***     History of Present Illness    [name] is followed by Dr. Azucena Cecil for the above outlined history.   Patient was last seen in the office by Dr. Azucena Cecil on 08/14/2022 for routine follow-up.  She reported fatigue but no chest pain.  She noted blood in her stool and was referred by primary care for colonoscopy.  Aspirin was stopped due to GI bleed.   Today,***  Nonobstructive CAD LHC November 2010 showed D2 60%, distal LAD 80%.  Abnormal nuclear stress test September 2019 with no invasive workup secondary to GI bleeding/anemia.  Patient*** -Continue isosorbide, carvedilol, atorvastatin, Zetia, as needed SL NTG.  Not on aspirin secondary to GI bleeding.   Chronic HFpEF Echo November 2022 showed normal biventricular function, mildly elevated PA pressure, moderate MR, mild mitral stenosis, mild to moderate TR, mild aortic valve stenosis.  Patient*** Euvolemic and well compensated on exam. -Continue lisinopril, isosorbide, carvedilol, Lasix.   Hypertension BP today*** -Continue lisinopril, carvedilol.   Hyperlipidemia *** -Continue atorvastatin and Zetia.   PAD ABI February 2023 showed mild bilateral lower extremity arterial  disease.  Patient*** -Continue atorvastatin and Zetia. -Continue to follow with vascular surgery.   Tobacco abuse Patient***   ROS:  All other systems reviewed and are otherwise negative except as noted in History of Present Illness.  Studies Reviewed       ***  Risk Assessment/Calculations    {Does this patient have ATRIAL FIBRILLATION?:[phone]} No BP recorded.  {Refresh Note OR Click here to enter BP  :1}***        Physical Exam    VS:  There were no vitals taken for this visit. , BMI There is no height or weight on file to calculate BMI.  GEN: Well nourished, well developed, in no acute distress. Neck: No JVD or carotid bruits. Cardiac: *** RRR. No murmurs. No rubs or gallops.   Respiratory:  Respirations regular and unlabored. Clear to auscultation without rales, wheezing or rhonchi. GI: Soft, nontender, nondistended. Extremities: Radials/DP/PT 2+ and equal bilaterally. No clubbing or cyanosis. No edema ***  Skin: Warm and dry, no rash. Neuro: Strength intact.  Assessment & Plan   ***  Disposition: ***     {Are you ordering a CV Procedure (e.g. stress test, cath, DCCV, TEE, etc)?   Press F2        :[id number]}   Signed, Etta Grandchild. Nalaysia Manganiello, DNP, NP-C

## 2023-11-09 ENCOUNTER — Ambulatory Visit: Payer: 59 | Attending: Student | Admitting: Student

## 2023-11-11 ENCOUNTER — Other Ambulatory Visit: Payer: Self-pay

## 2023-11-11 MED ORDER — LISINOPRIL 10 MG PO TABS: 10.0000 mg | ORAL_TABLET | Freq: Every day | ORAL | 0 refills | Status: DC

## 2023-11-24 ENCOUNTER — Other Ambulatory Visit: Payer: Self-pay

## 2023-11-24 NOTE — Telephone Encounter (Signed)
Please contact pt for future appointment. Pt overdue for 12 month f/u. 

## 2023-11-24 NOTE — Telephone Encounter (Signed)
Last office visit 08/14/22 with plan to f/u in 12 months   next office visit: none NS 11/09/23 appt  Please schedule f/u appt.  Prior to 90 day supply rx.  Thanks!

## 2023-11-24 NOTE — Telephone Encounter (Signed)
Grandson stated patient has our number and will advise her again to return our call.

## 2023-11-25 ENCOUNTER — Ambulatory Visit (HOSPITAL_BASED_OUTPATIENT_CLINIC_OR_DEPARTMENT_OTHER): Payer: 59

## 2023-11-25 ENCOUNTER — Other Ambulatory Visit (HOSPITAL_BASED_OUTPATIENT_CLINIC_OR_DEPARTMENT_OTHER): Payer: Self-pay | Admitting: Orthopaedic Surgery

## 2023-11-25 DIAGNOSIS — S52021A Displaced fracture of olecranon process without intraarticular extension of right ulna, initial encounter for closed fracture: Secondary | ICD-10-CM

## 2023-11-25 NOTE — Telephone Encounter (Signed)
Called patient again but grandson answered, provided me with her direct phone number, left voicemail for return call

## 2023-11-26 ENCOUNTER — Other Ambulatory Visit: Payer: Self-pay | Admitting: *Deleted

## 2023-11-26 MED ORDER — LISINOPRIL 10 MG PO TABS: 10.0000 mg | ORAL_TABLET | Freq: Every day | ORAL | 0 refills | Status: DC

## 2023-11-30 NOTE — Telephone Encounter (Signed)
Patient scheduled for 01/25/24.

## 2023-12-04 ENCOUNTER — Other Ambulatory Visit: Payer: 59

## 2023-12-07 ENCOUNTER — Other Ambulatory Visit: Payer: 59 | Attending: Nurse Practitioner

## 2024-01-22 ENCOUNTER — Telehealth: Payer: Self-pay | Admitting: *Deleted

## 2024-01-22 NOTE — Telephone Encounter (Signed)
Lmovm to complete [facility] verification no answer.

## 2024-01-25 ENCOUNTER — Ambulatory Visit: Payer: 59 | Admitting: Cardiology

## 2024-02-04 ENCOUNTER — Other Ambulatory Visit: Payer: Self-pay | Admitting: *Deleted

## 2024-02-04 NOTE — Telephone Encounter (Signed)
 Left voicemail for return

## 2024-02-05 NOTE — Telephone Encounter (Signed)
 Left voicemail for return call

## 2024-02-08 ENCOUNTER — Inpatient Hospital Stay: Payer: 59

## 2024-02-08 ENCOUNTER — Inpatient Hospital Stay: Payer: 59 | Admitting: Oncology

## 2024-02-08 NOTE — Telephone Encounter (Signed)
 Left voicemail for return call

## 2024-02-11 ENCOUNTER — Telehealth: Payer: Self-pay | Admitting: Cardiology

## 2024-02-11 NOTE — Telephone Encounter (Signed)
 Mailbox full

## 2024-02-11 NOTE — Telephone Encounter (Signed)
 Hx of N/S and cancelling appointment. Pt will need appointment for refills.

## 2024-02-11 NOTE — Telephone Encounter (Signed)
 Pt hasn't been seen since 08/2022. Pt overdue for follow up.  Pt has been contacted several times no answer/VM full.

## 2024-02-11 NOTE — Telephone Encounter (Signed)
 Please contact pt for future follow up. Pt overdue for appointment. Pt must be seen yearly for refills.

## 2024-02-11 NOTE — Telephone Encounter (Signed)
*  STAT* If patient is at the pharmacy, call can be transferred to refill team.   1. Which medications need to be refilled? (please list name of each medication and dose if known) Lisinopril 10 mg   2. Would you like to learn more about the convenience, safety, & potential cost savings by using the [facility]?    3. Are you open to using the [facility] (Type [facility].    4. Which pharmacy/location (including street and city if local pharmacy) is medication to be sent to?[facility]   5. Do they need a 30 day or 90 day supply? 90 days and refills

## 2024-02-12 NOTE — Telephone Encounter (Signed)
 Mailbox full. Unable to reach after multiple attempts.c

## 2024-03-01 ENCOUNTER — Telehealth: Payer: Self-pay | Admitting: *Deleted

## 2024-03-01 NOTE — Telephone Encounter (Signed)
 I called 2 times and she dd not answer and the voicemail is full

## 2024-03-07 ENCOUNTER — Telehealth: Payer: Self-pay | Admitting: *Deleted

## 2024-03-07 NOTE — Telephone Encounter (Signed)
 She called that someone called her today. No notes and I looked at her next appt is 4/1at :45 for labs and then see Smith Robert and she will be there

## 2024-03-08 ENCOUNTER — Inpatient Hospital Stay

## 2024-03-08 ENCOUNTER — Telehealth: Payer: Self-pay | Admitting: Oncology

## 2024-03-08 ENCOUNTER — Inpatient Hospital Stay: Admitting: Oncology

## 2024-03-08 NOTE — Telephone Encounter (Signed)
 Patient called to reschedule appointment due to being sick. Appointment rescheduled as requested

## 2024-03-14 ENCOUNTER — Inpatient Hospital Stay (HOSPITAL_BASED_OUTPATIENT_CLINIC_OR_DEPARTMENT_OTHER): Admitting: Oncology

## 2024-03-14 ENCOUNTER — Inpatient Hospital Stay: Attending: Oncology

## 2024-03-14 ENCOUNTER — Encounter: Payer: Self-pay | Admitting: Oncology

## 2024-03-14 VITALS — BP 142/62 | HR 68 | Temp 97.7°F | Resp 14 | Wt 141.0 lb

## 2024-03-14 DIAGNOSIS — Z79899 Other long term (current) drug therapy: Secondary | ICD-10-CM | POA: Insufficient documentation

## 2024-03-14 DIAGNOSIS — D509 Iron deficiency anemia, unspecified: Secondary | ICD-10-CM | POA: Insufficient documentation

## 2024-03-14 DIAGNOSIS — E538 Deficiency of other specified B group vitamins: Secondary | ICD-10-CM

## 2024-03-14 LAB — CBC
HCT: 29.3 % — ABNORMAL LOW (ref 36.0–46.0)
Hemoglobin: 8.8 g/dL — ABNORMAL LOW (ref 12.0–15.0)
MCH: 25.4 pg — ABNORMAL LOW (ref 26.0–34.0)
MCHC: 30 g/dL (ref 30.0–36.0)
MCV: 84.7 fL (ref 80.0–100.0)
Platelets: 230 10*3/uL (ref 150–400)
RBC: 3.46 MIL/uL — ABNORMAL LOW (ref 3.87–5.11)
RDW: 15.7 % — ABNORMAL HIGH (ref 11.5–15.5)
WBC: 9.2 10*3/uL (ref 4.0–10.5)
nRBC: 0 % (ref 0.0–0.2)

## 2024-03-14 LAB — IRON AND TIBC
Iron: 27 ug/dL — ABNORMAL LOW (ref 28–170)
Saturation Ratios: 8 % — ABNORMAL LOW (ref 10.4–31.8)
TIBC: 354 ug/dL (ref 250–450)
UIBC: 327 ug/dL

## 2024-03-14 LAB — FERRITIN: Ferritin: 10 ng/mL — ABNORMAL LOW (ref 11–307)

## 2024-03-14 NOTE — Progress Notes (Unsigned)
 Hematology/Oncology Consult note [facility]  [phone] [phone]  Patient Care Team: [name] as PCP - General Debbe Odea, MD as PCP - Cardiology (Cardiology) Creig Hines, MD as Consulting Physician (Oncology)   Name of the patient: Traci Duran  [id number]  December 03, 1956   Date of visit: 03/14/24  Diagnosis-iron and B12 deficiency anemia  Chief complaint/ Reason for visit-routine follow-up of anemia  Heme/Onc history: Patient is a 68 year old female referred for iron deficiency anemia.  Most recent CBC from 07/16/2020 showed white count of 7.9, H&H of 7.2/25.6 with an MCV of 74.9 and a platelet count of 280.  Ferritin levels were low at 19 and iron studies showed low iron saturation of 4%.  B12 was low at 187.  Folate was normal.  She also underwent small bowel endoscopy for obscure GI bleeding which showed no evidence of any pathology in proximal jejunum medium sized hiatal hernia she has previously undergone EGD and colonoscopy in October and November 2020 as well she is here for consideration of IV iron.Patient had a repeat EGD and colonoscopy in November 2023.  EGD showed normal stomach and duodenal bulb.  Reflux esophagitis with no bleeding.  Colonoscopy showed nonbleeding external hemorrhoids.  2 polyps in the transverse colon which were resected and was negative for malignancy. Patient has had complete anemia workup in the past in April 2024 as well when myeloma panel showed no M protein.  Serum free light chain was normal.  B12 levels were mildly low at 265.  Folate levels normal haptoglobin and reticulocyte count normal. Patient has tolerated IV iron well in the past    Interval history-patient had surgery for her left elbow about 2 months ago and is recovering from the same.  No recent falls.  Denies any blood loss in her stool or urine.  ECOG PS- 1 Pain scale- 4 Opioid associated constipation-  no  Review of systems- Review of Systems  Constitutional:  Positive for malaise/fatigue. Negative for chills, fever and weight loss.  HENT:  Negative for congestion, ear discharge and nosebleeds.   Eyes:  Negative for blurred vision.  Respiratory:  Negative for cough, hemoptysis, sputum production, shortness of breath and wheezing.   Cardiovascular:  Negative for chest pain, palpitations, orthopnea and claudication.  Gastrointestinal:  Negative for abdominal pain, blood in stool, constipation, diarrhea, heartburn, melena, nausea and vomiting.  Genitourinary:  Negative for dysuria, flank pain, frequency, hematuria and urgency.  Musculoskeletal:  Negative for back pain, joint pain and myalgias.  Skin:  Negative for rash.  Neurological:  Negative for dizziness, tingling, focal weakness, seizures, weakness and headaches.  Endo/Heme/Allergies:  Does not bruise/bleed easily.  Psychiatric/Behavioral:  Negative for depression and suicidal ideas. The patient does not have insomnia.       Allergies  Allergen Reactions   Chantix [Varenicline] Hives, Shortness Of Breath and Palpitations   Diphenhydramine Hcl Shortness Of Breath   Benadryl [Diphenhydramine Hcl] Rash   Niacin Rash   Trazodone Palpitations     Past Medical History:  Diagnosis Date   Anemia 05/2020   Thought to be related to GI bleed.   Anxiety    Bilateral carotid artery stenosis without cerebral infarction 10/2010    November 2011, CTA of the head and neck: Near complete occlusion versus severe stenosis of nearly the entire basilar artery, complete occlusion of the right vertebral artery from its origin off the right SCA, moderate noncalcified atherosclerotic in the proximal left SCA  which results in a 40% stenosis. Right ICA with approximately 15% stenosis, left   Nov. 2012 Carotid Duplex: >75% diameter re   CHF (congestive heart failure), chronic HFrEF NYHA class II, chronic, diastolic (HCC)    Chronic HFrEF   COPD  (chronic obstructive pulmonary disease) (HCC)    Coronary artery disease, non-occlusive 10/2009   Cardiac Cath: EF 85%;  40% prox &mid RCA -> 80% RPDA and AMrg, 60% RPAV.  40% prox & distal LAD w. 60% dLAD and D2, - Med Rx; Myoview 9/19: Small size, Mod severity Apical Anterior defect  c/w ISCHEMIA. -> INTERMEDIATE RISK (med Rx because of anemia, and known distal LAD and PDA disease. ->  Similar defect noted in August 2009)   Depression    Gastritis    GERD (gastroesophageal reflux disease)    Hemorrhoids    Hyperlipidemia    Hypertension    Mild aortic stenosis by prior echocardiogram 06/2020   Mild aortic stenosis by echo   Moderate to severe mitral regurgitation 06/2020   Echo at [facility]: Moderate-severe MR with mild RV enlargement and severely elevated PA 95 mmHg.   Myocardial infarction [facility])    Demand ischemia infarction initially in 2010   PAD (peripheral artery disease) (HCC) 07/2008   a) 8/'09: normal ABIs; b) post procedure Dopplers December 2010: Suggestion of left PFA/CFA-SFV AV fistula.; c) 02/2010: Left CFA-PFA AV fistula still present.; d) 04/2010: CTA Abd/Pelvis- LE Runoff: Extensive Mixed plaque in the Abd Ao.  Patent visceral As.  Bilat 2V runnoff-feet ATA & Peroneal A;  (confirmed by Arteriogram 03/2011); e) ABIs October 2017 R ABI 0.88, L ABI 0.79.   Pulmonary hypertension (HCC) 06/2020   [facility] echo shows moderate-severe MR, moderate to severe TR with RVP estimated 95 mmHg.   Stenosis of left subclavian artery (HCC) 10/27/2010   ~75% by recent doppler -- CHECK BP ON R ARM (or bilaterally for confirmation)     Past Surgical History:  Procedure Laterality Date   APPENDECTOMY     COLONOSCOPY WITH PROPOFOL N/A 06/09/2017   Procedure: COLONOSCOPY WITH PROPOFOL;  Surgeon: Midge Minium, MD;  Location: [facility] ENDOSCOPY;  Service: Endoscopy;  Laterality: N/A;   COLONOSCOPY WITH PROPOFOL N/A 10/08/2019   Procedure: COLONOSCOPY WITH PROPOFOL;  Surgeon: Midge Minium, MD;  Location: [facility]  ENDOSCOPY;  Service: Endoscopy;  Laterality: N/A;   COLONOSCOPY WITH PROPOFOL N/A 10/12/2022   Procedure: COLONOSCOPY WITH PROPOFOL;  Surgeon: Toney Reil, MD;  Location: [facility] ENDOSCOPY;  Service: Gastroenterology;  Laterality: N/A;   ENTEROSCOPY N/A 10/08/2019   Procedure: ENTEROSCOPY;  Surgeon: Midge Minium, MD;  Location: [facility] ENDOSCOPY;  Service: Endoscopy;  Laterality: N/A;   ENTEROSCOPY N/A 07/18/2020   Procedure: ENTEROSCOPY;  Surgeon: Toney Reil, MD;  Location: [facility] ENDOSCOPY;  Service: Gastroenterology;  Laterality: N/A;   ENTEROSCOPY N/A 04/07/2023   Procedure: ENTEROSCOPY;  Surgeon: Toney Reil, MD;  Location: [facility] ENDOSCOPY;  Service: Gastroenterology;  Laterality: N/A;  push   ESOPHAGOGASTRODUODENOSCOPY N/A 07/30/2017   Procedure: ESOPHAGOGASTRODUODENOSCOPY (EGD);  Surgeon: Toney Reil, MD;  Location: [facility] ENDOSCOPY;  Service: Gastroenterology;  Laterality: N/A;   ESOPHAGOGASTRODUODENOSCOPY (EGD) WITH PROPOFOL N/A 04/09/2017   Procedure: ESOPHAGOGASTRODUODENOSCOPY (EGD) WITH PROPOFOL;  Surgeon: Midge Minium, MD;  Location: [facility] ENDOSCOPY;  Service: Endoscopy;  Laterality: N/A;   ESOPHAGOGASTRODUODENOSCOPY (EGD) WITH PROPOFOL N/A 10/12/2022   Procedure: ESOPHAGOGASTRODUODENOSCOPY (EGD) WITH PROPOFOL;  Surgeon: Toney Reil, MD;  Location: [facility] ENDOSCOPY;  Service: Gastroenterology;  Laterality: N/A;   GIVENS CAPSULE STUDY N/A 10/08/2019  Procedure: GIVENS CAPSULE STUDY;  Surgeon: Midge Minium, MD;  Location: [facility] ENDOSCOPY;  Service: Endoscopy;  Laterality: N/A;   HIP FRACTURE SURGERY     LEFT HEART CATH AND CORONARY ANGIOGRAPHY  10/2009   [facility]: EF 85%;  40% prox &mid RCA -> 80% RPDA and AMrg, 60% RPAV.  40% prox & distal LAD w. 60% dLAD and D2, - Med Rx   LOWER EXTREMITY ARTERIOGRAM Bilateral 03/28/2011   ([facility]) RLE: moderate focal prox. R> CIA stenosis, patent PFA, SFA, CFA, 2 vessel runoff via PT/peroneal, AT proximally occluded.  LLE: moderate CFA stenosis, patent PFA, SFA and PA, 2 vessel runoff via AT/peroneal, PT occluded.   NM GATED MYOVIEW ([facility] HX)  08/2018   Small sized mild severity defect in the apical anterior wall concerning for ischemia.  INTERMEDIATE RISK. (Appears to be no change from March 31, 2008)   NM MYOVIEW LTD  07/2008   ([facility]): A) 07/2008: Minimal reversible defect in the apex with possible ischemia.  Likely represents artifact.  EF 59% -> Cath: 80% PDA, 60% PAV and distal LAD dZ); B) 08/2010: Lexiscan - > ischemia the Ant & Inf Apex.  EF 64%.(Med Rx); C) Jan 2013: + Apical Ischemia - unchanged; D) Jan 2016: EF 76%. No Ischemia /Infarct.   ORIF ELBOW FRACTURE Left 09/28/2023   Procedure: LEFT Open Reduction Internal fixation (ORIF), fracture, elbow/olecranon;  Surgeon: Huel Cote, MD;  Location: [facility] ORS;  Service: Orthopedics;  Laterality: Left;   TRANSTHORACIC ECHOCARDIOGRAM  11/2009   ([facility]) A) December 2010, 2D echo: EF >55%, mild concentric LVH, diastolic dysfunction Gr. I, LVIDd 4.2 cm, LVIDs 2.3 cm, mild TR (peak RVP 28 mmHg).; B) November 2011, 2D echo: EF >55%, mild concentric LVH, diastolic dysfunction Gr. I, LVIDd 4.3 cm, LVIDs 1.7 cm, mild PR (peak RVP 25 mmHg).; C) 12/2014 Echo: EF >55%, mild LVH, DD gr. I, LAE 3.9cm, Moderate TR (PRVP 35 mmHg)   TRANSTHORACIC ECHOCARDIOGRAM  06/13/2020   [facility]: EF 60-65%.  Normal LV function.  Indeterminate diastolic pressures.  Mild RV enlargement with severely elevated PAP estimated 95. moderate biatrial dilation.  Moderate to severe MR.  Moderate to severe TR.  Mild AS.    Social History   Socioeconomic History   Marital status: Widowed    Spouse name: Not on file   Number of children: 3   Years of education: Not on file   Highest education level: Not on file  Occupational History   Not on file  Tobacco Use   Smoking status: Every Day    Current packs/day: 1.00    Average packs/day: 1 pack/day for 46.0 years (46.0 ttl  pk-yrs)    Types: Cigarettes   Smokeless tobacco: Never   Tobacco comments:    0.5PPD 02/17/2022  Vaping Use   Vaping status: Never Used  Substance and Sexual Activity   Alcohol use: No   Drug use: Yes    Types: Marijuana    Comment: occasional   Sexual activity: Not Currently  Other Topics Concern   Not on file  Social History Narrative   She is a widowed mother of at least 3 daughters.  Unfortunately all 3 daughters have deceased.        2015: Her youngest daughter and son-in-law were run over by an [place] train   03-31-2017: Middle daughter died of complications of DKA   03-31-21: Oldest daughter died cancer after being on palliative care.   She also had a dog that died after  being with her for 18 years.   Social Drivers of Corporate investment banker Strain: Low Risk  (06/03/2022)   Received from [facility], [facility]   Overall Financial Resource Strain (CARDIA)    Difficulty of Paying Living Expenses: Not hard at all  Food Insecurity: No Food Insecurity (10/10/2022)   Hunger Vital Sign    Worried About Running Out of Food in the Last Year: Never true    Ran Out of Food in the Last Year: Never true  Transportation Needs: No Transportation Needs (10/10/2022)   PRAPARE - Administrator, Civil Service (Medical): No    Lack of Transportation (Non-Medical): No  Physical Activity: Inactive (11/09/2020)   Received from [facility], [facility]   Exercise Vital Sign    Days of Exercise per Week: 0 days    Minutes of Exercise per Session: 0 min  Stress: Stress Concern Present (11/09/2020)   Received from [facility], [facility]   [employer] of Occupational Health - Occupational Stress Questionnaire    Feeling of Stress : Rather much  Social Connections: Socially Isolated (11/09/2020)   Received from [facility], [facility]   Social Connection and Isolation Panel [NHANES]    Frequency of Communication with Friends and Family: Three times a week    Frequency of Social Gatherings with Friends and Family: Never    Attends Religious Services: Never    Database administrator or Organizations: No    Attends Banker Meetings: Never    Marital Status: Widowed  Intimate Partner Violence: Not At Risk (10/10/2022)   Humiliation, Afraid, Rape, and Kick questionnaire    Fear of Current or Ex-Partner: No    Emotionally Abused: No    Physically Abused: No    Sexually Abused: No    Family History  Problem Relation Age of Onset   Heart failure Mother    Heart attack Mother        mother died at 78 of an MI   Heart attack Father        father died at 79 of an MI   Heart attack Brother        Brother had an MI in his 37s     Current Outpatient Medications:    albuterol (PROVENTIL) (2.5 MG/3ML) 0.083% nebulizer solution, Take 3 mLs (2.5 mg total) by nebulization every 6 (six) hours as needed for wheezing or shortness of breath., Disp: 75 mL, Rfl: 0   alprazolam (XANAX) 2 MG tablet, Take 2 mg by mouth 2 (two) times daily. , Disp: , Rfl:    atorvastatin (LIPITOR) 80 MG tablet, Take 80 mg by mouth daily., Disp: , Rfl:    busPIRone (BUSPAR) 10 MG tablet, Take 10 mg by mouth 2 (two) times daily. , Disp: , Rfl: 2   carvedilol (COREG) 6.25 MG tablet, Take 6.25 mg by mouth 2 (two) times daily., Disp: , Rfl:    citalopram (CELEXA) 40 MG tablet, Take 40 mg by mouth daily., Disp: , Rfl:    DULoxetine (CYMBALTA) 60 MG capsule, Take 60 mg by mouth daily., Disp: , Rfl:    ezetimibe (ZETIA) 10 MG tablet, Take 1 tablet (10 mg total) by mouth daily., Disp: 90 tablet, Rfl: 3   ferrous gluconate (FERGON) 324 MG tablet, Take 1 tablet (324 mg total) by mouth daily with breakfast., Disp: 30 tablet,  Rfl: 1   furosemide (LASIX) 20 MG tablet, Take 1 tablet (20 mg total) by mouth daily., Disp: 90 tablet, Rfl: 1   gabapentin  (NEURONTIN) 400 MG capsule, Take 400 mg by mouth 3 (three) times daily., Disp: , Rfl:    ipratropium-albuterol (DUONEB) 0.5-2.5 (3) MG/3ML SOLN, Take 3 mLs by nebulization every 6 (six) hours as needed., Disp: 360 mL, Rfl: 0   isosorbide mononitrate (IMDUR) 30 MG 24 hr tablet, Take 1 tablet (30 mg total) by mouth daily., Disp: 30 tablet, Rfl: 6   lisinopril (ZESTRIL) 10 MG tablet, Take 1 tablet (10 mg total) by mouth daily., Disp: 15 tablet, Rfl: 0   naproxen (NAPROSYN) 500 MG tablet, Take 1 tablet (500 mg total) by mouth 2 (two) times daily with a meal., Disp: 20 tablet, Rfl: 2   nitroGLYCERIN (NITROSTAT) 0.4 MG SL tablet, Place 0.4 mg under the tongue as needed., Disp: , Rfl:    pantoprazole (PROTONIX) 40 MG tablet, Take 1 tablet (40 mg total) by mouth daily., Disp: 30 tablet, Rfl: 1   SYMBICORT 80-4.5 MCG/ACT inhaler, Inhale 2 puffs into the lungs 2 (two) times daily., Disp: , Rfl:    oxyCODONE (OXY IR/ROXICODONE) 5 MG immediate release tablet, Take 1-2 tablets (5-10 mg total) by mouth every 4 (four) hours as needed for moderate pain (pain score 4-6) or severe pain (pain score 7-10). (Patient not taking: Reported on 03/14/2024), Disp: 15 tablet, Rfl: 0   Vitamin D, Ergocalciferol, (DRISDOL) 1.25 MG (50000 UNIT) CAPS capsule, Take by mouth. (Patient not taking: Reported on 03/14/2024), Disp: , Rfl:  No current facility-administered medications for this visit.  Facility-Administered Medications Ordered in Other Visits:    alteplase (CATHFLO ACTIVASE) injection 2 mg, 2 mg, Intracatheter, Once PRN, Creig Hines, MD   heparin lock flush 100 unit/mL, 500 Units, Intracatheter, Once PRN, Creig Hines, MD   heparin lock flush 100 unit/mL, 250 Units, Intracatheter, Once PRN, Creig Hines, MD   sodium chloride flush (NS) 0.9 % injection 10 mL, 10 mL, Intracatheter, Once PRN, Creig Hines, MD   sodium chloride flush (NS) 0.9 % injection 3 mL, 3 mL, Intracatheter, Once PRN, Creig Hines, MD  Physical  exam:  Vitals:   03/14/24 1517  BP: (!) 142/62  Pulse: 68  Resp: 14  Temp: 97.7 F (36.5 C)  TempSrc: Tympanic  SpO2: 99%  Weight: 141 lb (64 kg)   Physical Exam Cardiovascular:     Rate and Rhythm: Normal rate and regular rhythm.     Heart sounds: Normal heart sounds.  Pulmonary:     Effort: Pulmonary effort is normal.     Breath sounds: Normal breath sounds.  Skin:    General: Skin is warm and dry.  Neurological:     Mental Status: She is alert and oriented to person, place, and time.      I have personally reviewed labs listed below:    Latest Ref Rng & Units 11/04/2023    2:59 PM  CMP  Glucose 70 - 99 mg/dL 54   BUN 8 - 23 mg/dL 10   Creatinine 5.40 - 1.00 mg/dL 9.81   Sodium 191 - 478 mmol/L 130   Potassium 3.5 - 5.1 mmol/L 4.4   Chloride 98 - 111 mmol/L 101   CO2 22 - 32 mmol/L 23   Calcium 8.9 - 10.3 mg/dL 8.8   Total Protein 6.5 - 8.1 g/dL 6.4   Total Bilirubin <2.9 mg/dL 0.3   Alkaline  Phos 38 - 126 U/L 101   AST 15 - 41 U/L 12   ALT 0 - 44 U/L 7       Latest Ref Rng & Units 03/14/2024    2:48 PM  CBC  WBC 4.0 - 10.5 K/uL 9.2   Hemoglobin 12.0 - 15.0 g/dL 8.8   Hematocrit 30.8 - 46.0 % 29.3   Platelets 150 - 400 K/uL 230     Assessment and plan- Patient is a 68 y.o. female with history of chronic normocytic anemia with a component of iron deficiency here for routine follow-up  Patient's baseline hemoglobin runs around 10 and is presently down to 8.8.  Anemia workup in the past has been unremarkable except for component of iron and B12 deficiency.  Iron studies from today are pending.  If she needs IV iron we will give her a call.  CBC ferritin and iron studies in 3 and 6 months and I will see her back in 6 months   Visit Diagnosis 1. Iron deficiency anemia, unspecified iron deficiency anemia type      Dr. Owens Shark, MD, MPH [facility] [phone] 03/14/2024 3:14 PM

## 2024-03-14 NOTE — Progress Notes (Unsigned)
 Patient is feeling very weak and fatigued, she has been urinating more frequently the past 3 days. She just feels like she is just getting more weak everyday.

## 2024-03-15 ENCOUNTER — Encounter: Payer: Self-pay | Admitting: Oncology

## 2024-03-16 ENCOUNTER — Ambulatory Visit: Admitting: Medical

## 2024-03-17 ENCOUNTER — Emergency Department

## 2024-03-17 ENCOUNTER — Encounter: Payer: Self-pay | Admitting: Medical Oncology

## 2024-03-17 ENCOUNTER — Inpatient Hospital Stay
Admission: EM | Admit: 2024-03-17 | Discharge: 2024-03-21 | DRG: 872 | Disposition: A | Discharge status: Home Health | Attending: Internal Medicine | Admitting: Internal Medicine

## 2024-03-17 ENCOUNTER — Other Ambulatory Visit: Payer: Self-pay

## 2024-03-17 DIAGNOSIS — Z87440 Personal history of urinary (tract) infections: Secondary | ICD-10-CM

## 2024-03-17 DIAGNOSIS — Z720 Tobacco use: Secondary | ICD-10-CM

## 2024-03-17 DIAGNOSIS — I130 Hypertensive heart and chronic kidney disease with heart failure and stage 1 through stage 4 chronic kidney disease, or unspecified chronic kidney disease: Secondary | ICD-10-CM | POA: Diagnosis present

## 2024-03-17 DIAGNOSIS — I5032 Chronic diastolic (congestive) heart failure: Secondary | ICD-10-CM | POA: Diagnosis present

## 2024-03-17 DIAGNOSIS — I252 Old myocardial infarction: Secondary | ICD-10-CM

## 2024-03-17 DIAGNOSIS — Z888 Allergy status to other drugs, medicaments and biological substances status: Secondary | ICD-10-CM

## 2024-03-17 DIAGNOSIS — A4151 Sepsis due to Escherichia coli [E. coli]: Secondary | ICD-10-CM | POA: Diagnosis not present

## 2024-03-17 DIAGNOSIS — Z7951 Long term (current) use of inhaled steroids: Secondary | ICD-10-CM

## 2024-03-17 DIAGNOSIS — G8929 Other chronic pain: Secondary | ICD-10-CM | POA: Diagnosis present

## 2024-03-17 DIAGNOSIS — R531 Weakness: Secondary | ICD-10-CM

## 2024-03-17 DIAGNOSIS — E785 Hyperlipidemia, unspecified: Secondary | ICD-10-CM | POA: Diagnosis present

## 2024-03-17 DIAGNOSIS — D509 Iron deficiency anemia, unspecified: Secondary | ICD-10-CM | POA: Diagnosis present

## 2024-03-17 DIAGNOSIS — F17210 Nicotine dependence, cigarettes, uncomplicated: Secondary | ICD-10-CM | POA: Diagnosis present

## 2024-03-17 DIAGNOSIS — F411 Generalized anxiety disorder: Secondary | ICD-10-CM | POA: Diagnosis present

## 2024-03-17 DIAGNOSIS — R7881 Bacteremia: Secondary | ICD-10-CM | POA: Diagnosis present

## 2024-03-17 DIAGNOSIS — R296 Repeated falls: Secondary | ICD-10-CM

## 2024-03-17 DIAGNOSIS — J449 Chronic obstructive pulmonary disease, unspecified: Secondary | ICD-10-CM | POA: Diagnosis present

## 2024-03-17 DIAGNOSIS — N3090 Cystitis, unspecified without hematuria: Principal | ICD-10-CM

## 2024-03-17 DIAGNOSIS — I70209 Unspecified atherosclerosis of native arteries of extremities, unspecified extremity: Secondary | ICD-10-CM | POA: Diagnosis present

## 2024-03-17 DIAGNOSIS — Z9181 History of falling: Secondary | ICD-10-CM

## 2024-03-17 DIAGNOSIS — N1832 Chronic kidney disease, stage 3b: Secondary | ICD-10-CM | POA: Diagnosis present

## 2024-03-17 DIAGNOSIS — R2689 Other abnormalities of gait and mobility: Secondary | ICD-10-CM | POA: Diagnosis present

## 2024-03-17 DIAGNOSIS — R32 Unspecified urinary incontinence: Secondary | ICD-10-CM | POA: Diagnosis present

## 2024-03-17 DIAGNOSIS — I739 Peripheral vascular disease, unspecified: Secondary | ICD-10-CM | POA: Diagnosis present

## 2024-03-17 DIAGNOSIS — I951 Orthostatic hypotension: Secondary | ICD-10-CM | POA: Diagnosis present

## 2024-03-17 DIAGNOSIS — Z8719 Personal history of other diseases of the digestive system: Secondary | ICD-10-CM

## 2024-03-17 DIAGNOSIS — K219 Gastro-esophageal reflux disease without esophagitis: Secondary | ICD-10-CM | POA: Diagnosis present

## 2024-03-17 DIAGNOSIS — I10 Essential (primary) hypertension: Secondary | ICD-10-CM | POA: Diagnosis present

## 2024-03-17 DIAGNOSIS — Z8249 Family history of ischemic heart disease and other diseases of the circulatory system: Secondary | ICD-10-CM

## 2024-03-17 DIAGNOSIS — Z791 Long term (current) use of non-steroidal anti-inflammatories (NSAID): Secondary | ICD-10-CM

## 2024-03-17 DIAGNOSIS — M5440 Lumbago with sciatica, unspecified side: Secondary | ICD-10-CM | POA: Diagnosis present

## 2024-03-17 DIAGNOSIS — I5042 Chronic combined systolic (congestive) and diastolic (congestive) heart failure: Secondary | ICD-10-CM | POA: Diagnosis present

## 2024-03-17 DIAGNOSIS — W19XXXA Unspecified fall, initial encounter: Secondary | ICD-10-CM | POA: Diagnosis present

## 2024-03-17 DIAGNOSIS — B9620 Unspecified Escherichia coli [E. coli] as the cause of diseases classified elsewhere: Secondary | ICD-10-CM | POA: Diagnosis present

## 2024-03-17 DIAGNOSIS — N10 Acute tubulo-interstitial nephritis: Principal | ICD-10-CM | POA: Diagnosis present

## 2024-03-17 DIAGNOSIS — M79605 Pain in left leg: Secondary | ICD-10-CM | POA: Diagnosis present

## 2024-03-17 DIAGNOSIS — Z79899 Other long term (current) drug therapy: Secondary | ICD-10-CM

## 2024-03-17 DIAGNOSIS — I2510 Atherosclerotic heart disease of native coronary artery without angina pectoris: Secondary | ICD-10-CM | POA: Diagnosis present

## 2024-03-17 DIAGNOSIS — I509 Heart failure, unspecified: Secondary | ICD-10-CM

## 2024-03-17 LAB — CBC
HCT: 25.3 % — ABNORMAL LOW (ref 36.0–46.0)
Hemoglobin: 8 g/dL — ABNORMAL LOW (ref 12.0–15.0)
MCH: 25.7 pg — ABNORMAL LOW (ref 26.0–34.0)
MCHC: 31.6 g/dL (ref 30.0–36.0)
MCV: 81.4 fL (ref 80.0–100.0)
Platelets: 218 10*3/uL (ref 150–400)
RBC: 3.11 MIL/uL — ABNORMAL LOW (ref 3.87–5.11)
RDW: 16.1 % — ABNORMAL HIGH (ref 11.5–15.5)
WBC: 14.5 10*3/uL — ABNORMAL HIGH (ref 4.0–10.5)
nRBC: 0 % (ref 0.0–0.2)

## 2024-03-17 LAB — URINALYSIS, ROUTINE W REFLEX MICROSCOPIC
Bilirubin Urine: NEGATIVE
Glucose, UA: NEGATIVE mg/dL
Ketones, ur: NEGATIVE mg/dL
Nitrite: NEGATIVE
Protein, ur: 30 mg/dL — AB
Specific Gravity, Urine: 1.004 — ABNORMAL LOW (ref 1.005–1.030)
WBC, UA: >50 WBC/hpf (ref 0–5)
pH: 6 (ref 5.0–8.0)

## 2024-03-17 LAB — BASIC METABOLIC PANEL WITH GFR
Anion gap: 10 (ref 5–15)
BUN: 18 mg/dL (ref 8–23)
CO2: 21 mmol/L — ABNORMAL LOW (ref 22–32)
Calcium: 8.2 mg/dL — ABNORMAL LOW (ref 8.9–10.3)
Chloride: 99 mmol/L (ref 98–111)
Creatinine, Ser: 1.47 mg/dL — ABNORMAL HIGH (ref 0.44–1.00)
GFR, Estimated: 39 mL/min — ABNORMAL LOW (ref >60)
Glucose, Bld: 86 mg/dL (ref 70–99)
Potassium: 3.2 mmol/L — ABNORMAL LOW (ref 3.5–5.1)
Sodium: 130 mmol/L — ABNORMAL LOW (ref 135–145)

## 2024-03-17 MED ORDER — POTASSIUM CHLORIDE 20 MEQ PO PACK
60.0000 meq | PACK | Freq: Once | ORAL | Status: AC
  Administered 2024-03-18: 60 meq via ORAL
  Filled 2024-03-17: qty 3

## 2024-03-17 MED ORDER — ONDANSETRON HCL 4 MG/2ML IJ SOLN
4.0000 mg | Freq: Once | INTRAMUSCULAR | Status: AC
  Administered 2024-03-17: 4 mg via INTRAVENOUS
  Filled 2024-03-17: qty 2

## 2024-03-17 MED ORDER — ACETAMINOPHEN 500 MG PO TABS
1000.0000 mg | ORAL_TABLET | Freq: Once | ORAL | Status: AC
  Administered 2024-03-17: 1000 mg via ORAL
  Filled 2024-03-17: qty 2

## 2024-03-17 MED ORDER — ACETAMINOPHEN 650 MG RE SUPP: 650.0000 mg | Freq: Four times a day (QID) | RECTAL | Status: DC | PRN

## 2024-03-17 MED ORDER — KETOROLAC TROMETHAMINE 15 MG/ML IJ SOLN
7.5000 mg | Freq: Once | INTRAMUSCULAR | Status: AC
Start: 2024-03-17 — End: 2024-03-17
  Administered 2024-03-17: 7.5 mg via INTRAVENOUS
  Filled 2024-03-17: qty 1

## 2024-03-17 MED ORDER — FLUTICASONE FUROATE-VILANTEROL 200-25 MCG/ACT IN AEPB
1.0000 | INHALATION_SPRAY | Freq: Every day | RESPIRATORY_TRACT | Status: DC
  Filled 2024-03-17: qty 28

## 2024-03-17 MED ORDER — SODIUM CHLORIDE 0.9% FLUSH
3.0000 mL | Freq: Two times a day (BID) | INTRAVENOUS | Status: DC
  Administered 2024-03-18 – 2024-03-21 (×6): 3 mL via INTRAVENOUS

## 2024-03-17 MED ORDER — ENOXAPARIN SODIUM 40 MG/0.4ML IJ SOSY
40.0000 mg | PREFILLED_SYRINGE | INTRAMUSCULAR | Status: DC
  Administered 2024-03-18: 40 mg via SUBCUTANEOUS
  Filled 2024-03-17: qty 0.4

## 2024-03-17 MED ORDER — POLYETHYLENE GLYCOL 3350 17 G PO PACK: 17.0000 g | PACK | Freq: Every day | ORAL | Status: DC | PRN

## 2024-03-17 MED ORDER — ALPRAZOLAM 0.5 MG PO TABS
1.0000 mg | ORAL_TABLET | Freq: Two times a day (BID) | ORAL | Status: DC
  Administered 2024-03-18: 1 mg via ORAL
  Filled 2024-03-17: qty 2

## 2024-03-17 MED ORDER — ACETAMINOPHEN 325 MG PO TABS
650.0000 mg | ORAL_TABLET | Freq: Four times a day (QID) | ORAL | Status: DC | PRN
  Administered 2024-03-18: 650 mg via ORAL
  Filled 2024-03-17: qty 2

## 2024-03-17 MED ORDER — SODIUM CHLORIDE 0.9 % IV SOLN
1.0000 g | INTRAVENOUS | Status: DC
  Administered 2024-03-18 (×2): 1 g via INTRAVENOUS
  Filled 2024-03-17 (×3): qty 10

## 2024-03-17 MED ORDER — CEPHALEXIN 500 MG PO CAPS
500.0000 mg | ORAL_CAPSULE | Freq: Once | ORAL | Status: AC
  Administered 2024-03-17: 500 mg via ORAL
  Filled 2024-03-17: qty 1

## 2024-03-17 MED ORDER — DULOXETINE HCL 60 MG PO CPEP: 60.0000 mg | ORAL_CAPSULE | Freq: Every day | ORAL | Status: DC

## 2024-03-17 MED ORDER — CITALOPRAM HYDROBROMIDE 20 MG PO TABS
40.0000 mg | ORAL_TABLET | Freq: Every day | ORAL | Status: DC
Start: 2024-03-18 — End: 2024-03-18

## 2024-03-17 MED ORDER — SODIUM CHLORIDE 0.9 % IV BOLUS
1000.0000 mL | Freq: Once | INTRAVENOUS | Status: AC
  Administered 2024-03-17: 1000 mL via INTRAVENOUS

## 2024-03-17 NOTE — ED Notes (Signed)
 Pt stated she wanted to speak to the doctor with regards if she will be staying or leaving. The pt states she is feeling better but she is afraid of getting home and falling again. She states she lives with a roommate but, he is difficult to reach due to having to call him.  MD stafford made aware of the pt's concerns and states he will be speaking with her.

## 2024-03-17 NOTE — Assessment & Plan Note (Signed)
 X 2 weeks. Patient reports no back pain, no focal weakness. Recalls some pain with micturition. Has elevated WBC. Most consistent with urgency/frequency in the setitng of uti. Will check urine and blood culutres. Rx. With ceftriaxone.

## 2024-03-17 NOTE — ED Triage Notes (Signed)
 Pt from home via ems reports that she has had 3 falls in the last 2 days, states that it feels like her legs just give out. Also pt reports left flank pain with dysuria and BLE pain.  Initial BP was 89/57 with EMS- LR was given BP up to 98/54.  NSR 80 99.1 97RA  20 RT AC

## 2024-03-17 NOTE — Assessment & Plan Note (Addendum)
 Worse/new. Not a.w. focal weakness or orthostasis (per ER verbal report). Presented with BP of 93/66. Overall sounds fatigue or exertional related. Check troponin. Check ambulatory pulse ox in AM. S/p 1 liter saline in ER. BP improved. Treat uti as mentioned above.  To be thorough - pending CXR, abd xray (c/o nasuea) and thoraco/lumbar spinal xray. Check TSH b12 and phos

## 2024-03-17 NOTE — H&P (Signed)
 History and Physical    Patient: Traci Duran QMV:[id number] DOB: 24-Aug-1956 DOA: 03/17/2024 DOS: the patient was seen and examined on 03/17/2024 PCP: [name]  Patient coming from: Home  Chief Complaint:  Chief Complaint  Patient presents with   Flank Pain   Weakness   HPI: Traci Duran is a 68 y.o. 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 Inspite of this, the only fall that she recalls (besides her HPI falls) is about 3-4 months ago.  For the last couple of weeks, patient described cough and "congestion". Some expectoration of mucous.  no chest pain, no fever. Reports some   sensatin of sob with ambulation. She also described fear of passing out - althgouth this has never actually happened - over the same period of time. She also says that she is incontinent of urine over the same period of time. Last couple of days she has had 3 episodes of her legs giving out while ambulating causing her to fall directly downwards, no truama. Not felt to be due to new weaknes, presycnope or vertigo by patient. She has been nasueas for last 2 days. But no votming, abd pain or diahrea. Patient initiay said she has some pain with mictuition, then denies same.  Patient lives with room mate and is evaluated in ER for frequent falls. Per report - negative orthostatics and patient was markedly unsteady during ambulation trial. Medical eval is sought.  Review of Systems: As mentioned in the history of present illness. All other systems reviewed and are negative. Past Medical History:  Diagnosis Date   Anemia 05/2020   Thought to be related to GI bleed.   Anxiety    Bilateral carotid artery stenosis without cerebral infarction 10/2010    November 2011, CTA of the head and neck: Near complete occlusion versus severe stenosis of nearly the entire basilar artery, complete occlusion of the right vertebral artery from its origin off  the right SCA, moderate noncalcified atherosclerotic in the proximal left SCA which results in a 40% stenosis. Right ICA with approximately 15% stenosis, left   Nov. 2012 Carotid Duplex: >75% diameter re   CHF (congestive heart failure), chronic HFrEF NYHA class II, chronic, diastolic (HCC)    Chronic HFrEF   COPD (chronic obstructive pulmonary disease) (HCC)    Coronary artery disease, non-occlusive 10/2009   Cardiac Cath: EF 85%;  40% prox &mid RCA -> 80% RPDA and AMrg, 60% RPAV.  40% prox & distal LAD w. 60% dLAD and D2, - Med Rx; Myoview 9/19: Small size, Mod severity Apical Anterior defect  c/w ISCHEMIA. -> INTERMEDIATE RISK (med Rx because of anemia, and known distal LAD and PDA disease. ->  Similar defect noted in August 2009)   Depression    Gastritis    GERD (gastroesophageal reflux disease)    Hemorrhoids    Hyperlipidemia    Hypertension    Mild aortic stenosis by prior echocardiogram 06/2020   Mild aortic stenosis by echo   Moderate to severe mitral regurgitation 06/2020   Echo at [facility]: Moderate-severe MR with mild RV enlargement and severely elevated PA 95 mmHg.   Myocardial infarction [facility])    Demand ischemia infarction initially in 2010   PAD (peripheral artery disease) (HCC) 07/2008   a) 8/'09: normal ABIs; b) post procedure Dopplers December 2010: Suggestion of left PFA/CFA-SFV AV fistula.; c) 02/2010: Left CFA-PFA AV fistula still present.; d) 04/2010: CTA Abd/Pelvis- LE Runoff:  Extensive Mixed plaque in the Abd Ao.  Patent visceral As.  Bilat 2V runnoff-feet ATA & Peroneal A;  (confirmed by Arteriogram 03/2011); e) ABIs October 2017 R ABI 0.88, L ABI 0.79.   Pulmonary hypertension (HCC) 06/2020   [facility] echo shows moderate-severe MR, moderate to severe TR with RVP estimated 95 mmHg.   Stenosis of left subclavian artery (HCC) 10/27/2010   ~75% by recent doppler -- CHECK BP ON R ARM (or bilaterally for confirmation)   Past Surgical History:  Procedure Laterality Date    APPENDECTOMY     COLONOSCOPY WITH PROPOFOL N/A 06/09/2017   Procedure: COLONOSCOPY WITH PROPOFOL;  Surgeon: Midge Minium, MD;  Location: [facility] ENDOSCOPY;  Service: Endoscopy;  Laterality: N/A;   COLONOSCOPY WITH PROPOFOL N/A 10/08/2019   Procedure: COLONOSCOPY WITH PROPOFOL;  Surgeon: Midge Minium, MD;  Location: [facility] ENDOSCOPY;  Service: Endoscopy;  Laterality: N/A;   COLONOSCOPY WITH PROPOFOL N/A 10/12/2022   Procedure: COLONOSCOPY WITH PROPOFOL;  Surgeon: Toney Reil, MD;  Location: [facility] ENDOSCOPY;  Service: Gastroenterology;  Laterality: N/A;   ENTEROSCOPY N/A 10/08/2019   Procedure: ENTEROSCOPY;  Surgeon: Midge Minium, MD;  Location: [facility] ENDOSCOPY;  Service: Endoscopy;  Laterality: N/A;   ENTEROSCOPY N/A 07/18/2020   Procedure: ENTEROSCOPY;  Surgeon: Toney Reil, MD;  Location: [facility] ENDOSCOPY;  Service: Gastroenterology;  Laterality: N/A;   ENTEROSCOPY N/A 04/07/2023   Procedure: ENTEROSCOPY;  Surgeon: Toney Reil, MD;  Location: [facility] ENDOSCOPY;  Service: Gastroenterology;  Laterality: N/A;  push   ESOPHAGOGASTRODUODENOSCOPY N/A 07/30/2017   Procedure: ESOPHAGOGASTRODUODENOSCOPY (EGD);  Surgeon: Toney Reil, MD;  Location: [facility] ENDOSCOPY;  Service: Gastroenterology;  Laterality: N/A;   ESOPHAGOGASTRODUODENOSCOPY (EGD) WITH PROPOFOL N/A 04/09/2017   Procedure: ESOPHAGOGASTRODUODENOSCOPY (EGD) WITH PROPOFOL;  Surgeon: Midge Minium, MD;  Location: [facility] ENDOSCOPY;  Service: Endoscopy;  Laterality: N/A;   ESOPHAGOGASTRODUODENOSCOPY (EGD) WITH PROPOFOL N/A 10/12/2022   Procedure: ESOPHAGOGASTRODUODENOSCOPY (EGD) WITH PROPOFOL;  Surgeon: Toney Reil, MD;  Location: [facility] ENDOSCOPY;  Service: Gastroenterology;  Laterality: N/A;   GIVENS CAPSULE STUDY N/A 10/08/2019   Procedure: GIVENS CAPSULE STUDY;  Surgeon: Midge Minium, MD;  Location: [facility] ENDOSCOPY;  Service: Endoscopy;  Laterality: N/A;   HIP FRACTURE SURGERY     LEFT HEART CATH AND CORONARY ANGIOGRAPHY   10/2009   [facility]: EF 85%;  40% prox &mid RCA -> 80% RPDA and AMrg, 60% RPAV.  40% prox & distal LAD w. 60% dLAD and D2, - Med Rx   LOWER EXTREMITY ARTERIOGRAM Bilateral 03/28/2011   ([facility]) RLE: moderate focal prox. R> CIA stenosis, patent PFA, SFA, CFA, 2 vessel runoff via PT/peroneal, AT proximally occluded. LLE: moderate CFA stenosis, patent PFA, SFA and PA, 2 vessel runoff via AT/peroneal, PT occluded.   NM GATED MYOVIEW ([facility] HX)  08/2018   Small sized mild severity defect in the apical anterior wall concerning for ischemia.  INTERMEDIATE RISK. (Appears to be no change from 2009)   NM MYOVIEW LTD  07/2008   ([facility]): A) 07/2008: Minimal reversible defect in the apex with possible ischemia.  Likely represents artifact.  EF 59% -> Cath: 80% PDA, 60% PAV and distal LAD dZ); B) 08/2010: Lexiscan - > ischemia the Ant & Inf Apex.  EF 64%.(Med Rx); C) Jan 2013: + Apical Ischemia - unchanged; D) Jan 2016: EF 76%. No Ischemia /Infarct.   ORIF ELBOW FRACTURE Left 09/28/2023   Procedure: LEFT Open Reduction Internal fixation (ORIF), fracture, elbow/olecranon;  Surgeon: Huel Cote, MD;  Location: [facility] ORS;  Service: Orthopedics;  Laterality: Left;   TRANSTHORACIC ECHOCARDIOGRAM  11/2009   ([facility]) A) December 2010, 2D echo: EF >55%, mild concentric LVH, diastolic dysfunction Gr. I, LVIDd 4.2 cm, LVIDs 2.3 cm, mild TR (peak RVP 28 mmHg).; B) November 2011, 2D echo: EF >55%, mild concentric LVH, diastolic dysfunction Gr. I, LVIDd 4.3 cm, LVIDs 1.7 cm, mild PR (peak RVP 25 mmHg).; C) 12/2014 Echo: EF >55%, mild LVH, DD gr. I, LAE 3.9cm, Moderate TR (PRVP 35 mmHg)   TRANSTHORACIC ECHOCARDIOGRAM  06/13/2020   [facility]: EF 60-65%.  Normal LV function.  Indeterminate diastolic pressures.  Mild RV enlargement with severely elevated PAP estimated 95. moderate biatrial dilation.  Moderate to severe MR.  Moderate to severe TR.  Mild AS.   Social History:  reports that she has been  smoking cigarettes. She has a 46 pack-year smoking history. She has never used smokeless tobacco. She reports current drug use. Drug: Marijuana. She reports that she does not drink alcohol.  Allergies  Allergen Reactions   Chantix [Varenicline] Hives, Shortness Of Breath and Palpitations   Diphenhydramine Hcl Shortness Of Breath   Benadryl [Diphenhydramine Hcl] Rash   Niacin Rash   Trazodone Palpitations    Family History  Problem Relation Age of Onset   Heart failure Mother    Heart attack Mother        mother died at 72 of an MI   Heart attack Father        father died at 57 of an MI   Heart attack Brother        Brother had an MI in his 13s    Prior to Admission medications   Medication Sig Start Date End Date Taking? Authorizing Provider  albuterol (PROVENTIL) (2.5 MG/3ML) 0.083% nebulizer solution Take 3 mLs (2.5 mg total) by nebulization every 6 (six) hours as needed for wheezing or shortness of breath. 05/18/19   Mayo, Allyn Kenner, MD  alprazolam Prudy Feeler) 2 MG tablet Take 2 mg by mouth 2 (two) times daily.     [provider]  atorvastatin (LIPITOR) 80 MG tablet Take 80 mg by mouth daily.    [provider]  busPIRone (BUSPAR) 10 MG tablet Take 10 mg by mouth 2 (two) times daily.     [provider]  carvedilol (COREG) 6.25 MG tablet Take 6.25 mg by mouth 2 (two) times daily. 05/28/17   [provider]  citalopram (CELEXA) 40 MG tablet Take 40 mg by mouth daily.    [provider]  DULoxetine (CYMBALTA) 60 MG capsule Take 60 mg by mouth daily. 09/19/21   [provider]  ezetimibe (ZETIA) 10 MG tablet Take 1 tablet (10 mg total) by mouth daily. 09/05/22   Marykay Lex, MD  ferrous gluconate (FERGON) 324 MG tablet Take 1 tablet (324 mg total) by mouth daily with breakfast. 10/12/22   Lolita Patella B, MD  furosemide (LASIX) 20 MG tablet Take 1 tablet (20 mg total) by mouth daily. 02/11/22   Debbe Odea, MD  gabapentin  (NEURONTIN) 400 MG capsule Take 400 mg by mouth 3 (three) times daily. 09/23/21   [provider]  ipratropium-albuterol (DUONEB) 0.5-2.5 (3) MG/3ML SOLN Take 3 mLs by nebulization every 6 (six) hours as needed. 05/18/19   Jimmye Norman, NP  isosorbide mononitrate (IMDUR) 30 MG 24 hr tablet Take 1 tablet (30 mg total) by mouth daily. 06/13/20   Debbe Odea, MD  lisinopril (ZESTRIL) 10 MG tablet Take 1 tablet (10 mg  total) by mouth daily. 11/26/23   Debbe Odea, MD  naproxen (NAPROSYN) 500 MG tablet Take 1 tablet (500 mg total) by mouth 2 (two) times daily with a meal. 12/10/22   Jene Every, MD  nitroGLYCERIN (NITROSTAT) 0.4 MG SL tablet Place 0.4 mg under the tongue as needed. 10/06/19   [provider]  oxyCODONE (OXY IR/ROXICODONE) 5 MG immediate release tablet Take 1-2 tablets (5-10 mg total) by mouth every 4 (four) hours as needed for moderate pain (pain score 4-6) or severe pain (pain score 7-10). Patient not taking: Reported on 03/14/2024 09/28/23   Huel Cote, MD  pantoprazole (PROTONIX) 40 MG tablet Take 1 tablet (40 mg total) by mouth daily. 10/09/22 03/14/24  Pilar Jarvis, MD  SYMBICORT 80-4.5 MCG/ACT inhaler Inhale 2 puffs into the lungs 2 (two) times daily. 08/12/18   [provider]  Vitamin D, Ergocalciferol, (DRISDOL) 1.25 MG (50000 UNIT) CAPS capsule Take by mouth. Patient not taking: Reported on 03/14/2024 03/06/23   [provider]    Physical Exam: Vitals:   03/17/24 2000 03/17/24 2030 03/17/24 2100 03/17/24 2130  BP: 117/66 94/62 115/85 (!) 130/93  Pulse: 81 80 82 85  Resp: (!) 33 (!) 22 20 (!) 25  Temp:      TempSrc:      SpO2: 93% 94% 97% 97%  Weight:      Height:       Genreal - slow soft speaking lady. Appears frail. But eventually answered all questions coherently. No immediate distress apprent. Resp - b/l a/e vesicular Cvs-s1s2 normal Abdomen - osft non tender Extrmeity - no edema. Felt ot have no focal  deficit to motor exam. Spine - no focal tenderness.  Data Reviewed:  Labs on Admission:  Results for orders placed or performed during the hospital encounter of 03/17/24 (from the past 24 hours)  Basic metabolic panel     Status: Abnormal   Collection Time: 03/17/24  2:10 PM  Result Value Ref Range   Sodium 130 (L) 135 - 145 mmol/L   Potassium 3.2 (L) 3.5 - 5.1 mmol/L   Chloride 99 98 - 111 mmol/L   CO2 21 (L) 22 - 32 mmol/L   Glucose, Bld 86 70 - 99 mg/dL   BUN 18 8 - 23 mg/dL   Creatinine, Ser 1.61 (H) 0.44 - 1.00 mg/dL   Calcium 8.2 (L) 8.9 - 10.3 mg/dL   GFR, Estimated 39 (L) >60 mL/min   Anion gap 10 5 - 15  CBC     Status: Abnormal   Collection Time: 03/17/24  2:10 PM  Result Value Ref Range   WBC 14.5 (H) 4.0 - 10.5 K/uL   RBC 3.11 (L) 3.87 - 5.11 MIL/uL   Hemoglobin 8.0 (L) 12.0 - 15.0 g/dL   HCT 09.6 (L) 04.5 - 40.9 %   MCV 81.4 80.0 - 100.0 fL   MCH 25.7 (L) 26.0 - 34.0 pg   MCHC 31.6 30.0 - 36.0 g/dL   RDW 81.1 (H) 91.4 - 78.2 %   Platelets 218 150 - 400 K/uL   nRBC 0.0 0.0 - 0.2 %  Urinalysis, Routine w reflex microscopic -Urine, Clean Catch     Status: Abnormal   Collection Time: 03/17/24  5:42 PM  Result Value Ref Range   Color, Urine YELLOW (A) YELLOW   APPearance CLOUDY (A) CLEAR   Specific Gravity, Urine 1.004 (L) 1.005 - 1.030   pH 6.0 5.0 - 8.0   Glucose, UA NEGATIVE NEGATIVE mg/dL  Hgb urine dipstick MODERATE (A) NEGATIVE   Bilirubin Urine NEGATIVE NEGATIVE   Ketones, ur NEGATIVE NEGATIVE mg/dL   Protein, ur 30 (A) NEGATIVE mg/dL   Nitrite NEGATIVE NEGATIVE   Leukocytes,Ua LARGE (A) NEGATIVE   RBC / HPF 11-20 0 - 5 RBC/hpf   WBC, UA >50 0 - 5 WBC/hpf   Bacteria, UA RARE (A) NONE SEEN   Squamous Epithelial / HPF 0-5 0 - 5 /HPF   WBC Clumps PRESENT    Basic Metabolic Panel: Recent Labs  Lab 03/17/24 1410  NA 130*  K 3.2*  CL 99  CO2 21*  GLUCOSE 86  BUN 18  CREATININE 1.47*  CALCIUM 8.2*   Liver Function Tests: No results for  input(s): "AST", "ALT", "ALKPHOS", "BILITOT", "PROT", "ALBUMIN" in the last 168 hours. No results for input(s): "LIPASE", "AMYLASE" in the last 168 hours. No results for input(s): "AMMONIA" in the last 168 hours. CBC: Recent Labs  Lab 03/14/24 1448 03/17/24 1410  WBC 9.2 14.5*  HGB 8.8* 8.0*  HCT 29.3* 25.3*  MCV 84.7 81.4  PLT 230 218   Cardiac Enzymes: No results for input(s): "CKTOTAL", "CKMB", "CKMBINDEX", "TROPONINIHS" in the last 168 hours.  BNP (last 3 results) No results for input(s): "PROBNP" in the last 8760 hours. CBG: No results for input(s): "GLUCAP" in the last 168 hours.  Radiological Exams on Admission:  CT Renal Stone Study Result Date: 03/17/2024 CLINICAL DATA:  Left flank pain and dysuria. EXAM: CT ABDOMEN AND PELVIS WITHOUT CONTRAST TECHNIQUE: Multidetector CT imaging of the abdomen and pelvis was performed following the standard protocol without IV contrast. RADIATION DOSE REDUCTION: This exam was performed according to the departmental dose-optimization program which includes automated exposure control, adjustment of the mA and/or kV according to patient size and/or use of iterative reconstruction technique. COMPARISON:  October 09, 2022 FINDINGS: Lower chest: No acute abnormality. Hepatobiliary: No focal liver abnormality is seen. No gallstones, gallbladder wall thickening, or biliary dilatation. Pancreas: Unremarkable. No pancreatic ductal dilatation or surrounding inflammatory changes. Spleen: Numerous punctate parenchymal calcifications are seen scattered throughout the parenchyma of an otherwise normal-appearing spleen. Adrenals/Urinary Tract: Adrenal glands are unremarkable. The right kidney is atrophic in appearance. The left kidney is normal in size, without renal calculi, focal lesion, or hydronephrosis. The urinary bladder is poorly distended and subsequently limited in evaluation. Mild to moderate severity, predominantly anterior urinary bladder wall  thickening is noted. Stomach/Bowel: There is a small hiatal hernia. The appendix is surgically absent. No evidence of bowel wall thickening, distention, or inflammatory changes. Numerous noninflamed diverticula are seen throughout the descending and sigmoid colon. Vascular/Lymphatic: Aortic atherosclerosis. No enlarged abdominal or pelvic lymph nodes. Reproductive: Uterus and bilateral adnexa are unremarkable. Other: No abdominal wall hernia or abnormality. No abdominopelvic ascites. Musculoskeletal: A metallic density intramedullary rod and compression screw device are seen within the proximal left femur. There is associated streak artifact with subsequently limited evaluation of the adjacent osseous and soft tissue structures. Multilevel degenerative changes are seen involving predominantly the lower thoracic spine. IMPRESSION: 1. Anterior urinary bladder wall thickening which may be secondary to underdistention. Correlation with urinalysis is recommended to exclude the presence of an underlying infectious or inflammatory process. 2. Atrophic right kidney. 3. Colonic diverticulosis. 4. Small hiatal hernia. 5. Evidence of prior appendectomy. 6. Aortic atherosclerosis. Electronically Signed   By: Aram Candela M.D.   On: 03/17/2024 21:45    chest X-ray  EKG: Independently reviewed. NSR  No intake/output data recorded. No intake/output data recorded.  Assessment and Plan: Falls Worse/new. Not a.w. focal weakness or orthostasis (per ER verbal report). Presented with BP of 93/66. Overall sounds fatigue or exertional related. Check troponin. Check ambulatory pulse ox in AM. S/p 1 liter saline in ER. BP improved. Treat uti as mentioned above.  To be thorough - pending CXR, abd xray (c/o nasuea) and thoraco/lumbar spinal xray. Check TSH b12 and phos  Urinary incontinence X 2 weeks. Patient reports no back pain, no focal weakness. Recalls some pain with micturition. Has elevated WBC. Most  consistent with urgency/frequency in the setitng of uti. Will check urine and blood culutres. Rx. With ceftriaxone.   Paitnet seesm to be taking xanax bid since decemebr - I will taper it to 1 bid. C.w. slow taper.  Med rec pending pharmcy input. Orderedd celexa, cumbalta and xanax and symbicort (substitute) after discussion with patient.  Ppx - lovenox for DVT    Advance Care Planning:   Code Status: Prior patient advises fll code status.  Consults: none at this time.  Family Communication: in AM  Severity of Illness: The appropriate patient status for this patient is OBSERVATION. Observation status is judged to be reasonable and necessary in order to provide the required intensity of service to ensure the patient's safety. The patient's presenting symptoms, physical exam findings, and initial radiographic and laboratory data in the context of their medical condition is felt to place them at decreased risk for further clinical deterioration. Furthermore, it is anticipated that the patient will be medically stable for discharge from the hospital within 2 midnights of admission.   Author: Nolberto Hanlon, MD 03/17/2024 10:59 PM  For on call review [email or web address].

## 2024-03-17 NOTE — ED Provider Notes (Signed)
 [facility] Provider Note    Event Date/Time   First MD Initiated Contact with Patient 03/17/24 1545     (approximate)   History   Chief Complaint: Flank Pain and Weakness   HPI  Traci Duran is a 68 y.o. female with a history of CAD, anxiety, COPD, CHF who comes to the ED complaining of fatigue, several falls over the last few days due to feeling weak on her legs.  Endorses decreased appetite and decreased oral intake as well as some lightheadedness with standing.  Denies any significant injury from the falls.  No head injury, no neck pain no headache.  Patient also endorses dysuria and urinary frequency.  Also endorses left flank pain.  Per EMS blood pressure was borderline and patient was given fluid bolus prior to arrival in the ED.          Physical Exam   Triage Vital Signs: ED Triage Vitals  Encounter Vitals Group     BP 03/17/24 1405 105/73     Systolic BP Percentile --      Diastolic BP Percentile --      Pulse Rate 03/17/24 1405 81     Resp 03/17/24 1405 16     Temp 03/17/24 1405 99.3 F (37.4 C)     Temp Source 03/17/24 1405 Oral     SpO2 03/17/24 1405 100 %     Weight 03/17/24 1407 141 lb 1.5 oz (64 kg)     Height 03/17/24 1407 5\' 7"  (1.702 m)     Head Circumference --      Peak Flow --      Pain Score 03/17/24 1407 8     Pain Loc --      Pain Education --      Exclude from Growth Chart --     Most recent vital signs: Vitals:   03/17/24 2100 03/17/24 2130  BP: 115/85 (!) 130/93  Pulse: 82 85  Resp: 20 (!) 25  Temp:    SpO2: 97% 97%    General: Awake, no distress.  CV:  Good peripheral perfusion.  Regular rate rhythm Resp:  Normal effort.  Clear to auscultation bilaterally Abd:  No distention.  Soft with suprapubic tenderness.  No CVA tenderness. Other:  Dry oral mucosa.  Abrasion on right forearm, no lacerations.  No other wounds.  Full range of motion all extremities, long bones stable.   ED Results /  Procedures / Treatments   Labs (all labs ordered are listed, but only abnormal results are displayed) Labs Reviewed  BASIC METABOLIC PANEL WITH GFR - Abnormal; Notable for the following components:      Result Value   Sodium 130 (*)    Potassium 3.2 (*)    CO2 21 (*)    Creatinine, Ser 1.47 (*)    Calcium 8.2 (*)    GFR, Estimated 39 (*)    All other components within normal limits  CBC - Abnormal; Notable for the following components:   WBC 14.5 (*)    RBC 3.11 (*)    Hemoglobin 8.0 (*)    HCT 25.3 (*)    MCH 25.7 (*)    RDW 16.1 (*)    All other components within normal limits  URINALYSIS, ROUTINE W REFLEX MICROSCOPIC - Abnormal; Notable for the following components:   Color, Urine YELLOW (*)    APPearance CLOUDY (*)    Specific Gravity, Urine 1.004 (*)    Hgb urine dipstick MODERATE (*)  Protein, ur 30 (*)    Leukocytes,Ua LARGE (*)    Bacteria, UA RARE (*)    All other components within normal limits  CBG MONITORING, ED     EKG Interpreted by me Normal sinus rhythm rate of 81.  Normal axis, normal intervals.  Normal QRS ST segments and T waves   RADIOLOGY CT abdomen pelvis interpreted by me, negative for ureteral obstruction/stone.  Radiology report reviewed   PROCEDURES:  Procedures   MEDICATIONS ORDERED IN ED: Medications  sodium chloride 0.9 % bolus 1,000 mL (0 mLs Intravenous Stopped 03/17/24 1741)  ondansetron (ZOFRAN) injection 4 mg (4 mg Intravenous Given 03/17/24 1626)  ketorolac (TORADOL) 15 MG/ML injection 7.5 mg (7.5 mg Intravenous Given 03/17/24 1704)  cephALEXin (KEFLEX) capsule 500 mg (500 mg Oral Given 03/17/24 2042)  acetaminophen (TYLENOL) tablet 1,000 mg (1,000 mg Oral Given 03/17/24 2042)     IMPRESSION / MDM / ASSESSMENT AND PLAN / ED COURSE  I reviewed the triage vital signs and the nursing notes.  DDx: UTI, kidney stone, diverticulitis, appendicitis, dehydration, electrolyte derangement, anemia  Patient's presentation is most  consistent with acute presentation with potential threat to life or bodily function.  Patient presents with fatigue, malaise over the past few days along with some orthostatic symptoms, appears dehydrated.  Initial labs show baseline CKD, mild leukocytosis of 14,000.  Hemoglobin 8.0.  Will give IV fluids while obtaining UA  ----------------------------------------- 6:16 PM on 03/17/2024 ----------------------------------------- UA consistent with UTI.  Will obtain CT to evaluate for obstructing stone.   Clinical Course as of 03/17/24 2252  Thu Mar 17, 2024  2035 DRE = brown stool, hemoccult neg. [PS]  2212 Orthostatics negative after IVF, but pt still very weak, unsteady on feet, feels like she will fall.  Minimal support at home, lives with a roommate. Will request hospitalist eval. [PS]  2252 Case discussed with hospitalist Dr. Maryjean Ka who will evaluate the patient. [PS]    Clinical Course User Index [PS] Sharman Cheek, MD     FINAL CLINICAL IMPRESSION(S) / ED DIAGNOSES   Final diagnoses:  Cystitis  Generalized weakness  Chronic congestive heart failure, unspecified heart failure type [facility])     Rx / DC Orders   ED Discharge Orders     None        Note:  This document was prepared using Dragon voice recognition software and may include unintentional dictation errors.   Sharman Cheek, MD 03/17/24 2252

## 2024-03-17 NOTE — ED Notes (Addendum)
 LT and LAV sent to the lab.

## 2024-03-18 ENCOUNTER — Observation Stay

## 2024-03-18 DIAGNOSIS — W19XXXA Unspecified fall, initial encounter: Secondary | ICD-10-CM | POA: Diagnosis not present

## 2024-03-18 DIAGNOSIS — I130 Hypertensive heart and chronic kidney disease with heart failure and stage 1 through stage 4 chronic kidney disease, or unspecified chronic kidney disease: Secondary | ICD-10-CM | POA: Diagnosis present

## 2024-03-18 DIAGNOSIS — M5440 Lumbago with sciatica, unspecified side: Secondary | ICD-10-CM | POA: Diagnosis present

## 2024-03-18 DIAGNOSIS — I252 Old myocardial infarction: Secondary | ICD-10-CM | POA: Diagnosis not present

## 2024-03-18 DIAGNOSIS — Z791 Long term (current) use of non-steroidal anti-inflammatories (NSAID): Secondary | ICD-10-CM | POA: Diagnosis not present

## 2024-03-18 DIAGNOSIS — I2510 Atherosclerotic heart disease of native coronary artery without angina pectoris: Secondary | ICD-10-CM | POA: Diagnosis present

## 2024-03-18 DIAGNOSIS — I5042 Chronic combined systolic (congestive) and diastolic (congestive) heart failure: Secondary | ICD-10-CM | POA: Diagnosis present

## 2024-03-18 DIAGNOSIS — A4151 Sepsis due to Escherichia coli [E. coli]: Secondary | ICD-10-CM | POA: Diagnosis present

## 2024-03-18 DIAGNOSIS — F411 Generalized anxiety disorder: Secondary | ICD-10-CM | POA: Diagnosis present

## 2024-03-18 DIAGNOSIS — Z79899 Other long term (current) drug therapy: Secondary | ICD-10-CM | POA: Diagnosis not present

## 2024-03-18 DIAGNOSIS — R296 Repeated falls: Secondary | ICD-10-CM | POA: Diagnosis present

## 2024-03-18 DIAGNOSIS — K219 Gastro-esophageal reflux disease without esophagitis: Secondary | ICD-10-CM | POA: Diagnosis present

## 2024-03-18 DIAGNOSIS — J449 Chronic obstructive pulmonary disease, unspecified: Secondary | ICD-10-CM | POA: Diagnosis present

## 2024-03-18 DIAGNOSIS — N10 Acute tubulo-interstitial nephritis: Secondary | ICD-10-CM | POA: Diagnosis present

## 2024-03-18 DIAGNOSIS — E785 Hyperlipidemia, unspecified: Secondary | ICD-10-CM | POA: Diagnosis present

## 2024-03-18 DIAGNOSIS — G8929 Other chronic pain: Secondary | ICD-10-CM | POA: Diagnosis present

## 2024-03-18 DIAGNOSIS — D509 Iron deficiency anemia, unspecified: Secondary | ICD-10-CM | POA: Diagnosis present

## 2024-03-18 DIAGNOSIS — B9620 Unspecified Escherichia coli [E. coli] as the cause of diseases classified elsewhere: Secondary | ICD-10-CM | POA: Diagnosis present

## 2024-03-18 DIAGNOSIS — F17210 Nicotine dependence, cigarettes, uncomplicated: Secondary | ICD-10-CM | POA: Diagnosis present

## 2024-03-18 DIAGNOSIS — N1832 Chronic kidney disease, stage 3b: Secondary | ICD-10-CM | POA: Insufficient documentation

## 2024-03-18 DIAGNOSIS — R32 Unspecified urinary incontinence: Secondary | ICD-10-CM | POA: Diagnosis present

## 2024-03-18 DIAGNOSIS — I70209 Unspecified atherosclerosis of native arteries of extremities, unspecified extremity: Secondary | ICD-10-CM | POA: Diagnosis present

## 2024-03-18 DIAGNOSIS — M79605 Pain in left leg: Secondary | ICD-10-CM | POA: Diagnosis present

## 2024-03-18 DIAGNOSIS — I951 Orthostatic hypotension: Secondary | ICD-10-CM | POA: Diagnosis present

## 2024-03-18 DIAGNOSIS — R531 Weakness: Secondary | ICD-10-CM | POA: Diagnosis not present

## 2024-03-18 DIAGNOSIS — R2689 Other abnormalities of gait and mobility: Secondary | ICD-10-CM | POA: Diagnosis present

## 2024-03-18 LAB — RESPIRATORY PANEL BY PCR

## 2024-03-18 LAB — BASIC METABOLIC PANEL WITH GFR
Anion gap: 9 (ref 5–15)
BUN: 21 mg/dL (ref 8–23)
CO2: 19 mmol/L — ABNORMAL LOW (ref 22–32)
Calcium: 8 mg/dL — ABNORMAL LOW (ref 8.9–10.3)
Chloride: 103 mmol/L (ref 98–111)
Creatinine, Ser: 1.47 mg/dL — ABNORMAL HIGH (ref 0.44–1.00)
GFR, Estimated: 39 mL/min — ABNORMAL LOW (ref >60)
Glucose, Bld: 90 mg/dL (ref 70–99)
Potassium: 4 mmol/L (ref 3.5–5.1)
Sodium: 131 mmol/L — ABNORMAL LOW (ref 135–145)

## 2024-03-18 LAB — RESP PANEL BY RT-PCR (RSV, FLU A&B, COVID)  RVPGX2
Influenza A by PCR: NEGATIVE
Influenza B by PCR: NEGATIVE
Resp Syncytial Virus by PCR: NEGATIVE
SARS Coronavirus 2 by RT PCR: NEGATIVE

## 2024-03-18 LAB — HEPATIC FUNCTION PANEL
ALT: 8 U/L (ref 0–44)
AST: 14 U/L — ABNORMAL LOW (ref 15–41)
Albumin: 2.8 g/dL — ABNORMAL LOW (ref 3.5–5.0)
Alkaline Phosphatase: 72 U/L (ref 38–126)
Bilirubin, Direct: 0.2 mg/dL (ref 0.0–0.2)
Indirect Bilirubin: 0.8 mg/dL (ref 0.3–0.9)
Total Bilirubin: 1 mg/dL (ref 0.0–1.2)
Total Protein: 6 g/dL — ABNORMAL LOW (ref 6.5–8.1)

## 2024-03-18 LAB — PHOSPHORUS: Phosphorus: 1.5 mg/dL — ABNORMAL LOW (ref 2.5–4.6)

## 2024-03-18 LAB — TROPONIN I (HIGH SENSITIVITY)
Troponin I (High Sensitivity): 69 ng/L — ABNORMAL HIGH (ref <18)
Troponin I (High Sensitivity): 45 ng/L — ABNORMAL HIGH (ref <18)

## 2024-03-18 LAB — CBC
HCT: 25.7 % — ABNORMAL LOW (ref 36.0–46.0)
Hemoglobin: 8.1 g/dL — ABNORMAL LOW (ref 12.0–15.0)
MCH: 26 pg (ref 26.0–34.0)
MCHC: 31.5 g/dL (ref 30.0–36.0)
MCV: 82.6 fL (ref 80.0–100.0)
Platelets: 190 10*3/uL (ref 150–400)
RBC: 3.11 MIL/uL — ABNORMAL LOW (ref 3.87–5.11)
RDW: 16.2 % — ABNORMAL HIGH (ref 11.5–15.5)
WBC: 11.2 10*3/uL — ABNORMAL HIGH (ref 4.0–10.5)
nRBC: 0 % (ref 0.0–0.2)

## 2024-03-18 LAB — PROTIME-INR
INR: 1.1 (ref 0.8–1.2)
Prothrombin Time: 14.4 s (ref 11.4–15.2)

## 2024-03-18 LAB — TSH: TSH: 1.068 u[IU]/mL (ref 0.350–4.500)

## 2024-03-18 LAB — HIV ANTIBODY (ROUTINE TESTING W REFLEX): HIV Screen 4th Generation wRfx: NONREACTIVE

## 2024-03-18 LAB — APTT: aPTT: 40 s — ABNORMAL HIGH (ref 24–36)

## 2024-03-18 LAB — CK: Total CK: 84 U/L (ref 38–234)

## 2024-03-18 LAB — VITAMIN B12: Vitamin B-12: 192 pg/mL (ref 180–914)

## 2024-03-18 MED ORDER — DULOXETINE HCL 30 MG PO CPEP
60.0000 mg | ORAL_CAPSULE | Freq: Every day | ORAL | Status: DC
  Administered 2024-03-18 – 2024-03-21 (×4): 60 mg via ORAL
  Filled 2024-03-18 (×3): qty 2
  Filled 2024-03-18: qty 1

## 2024-03-18 MED ORDER — GABAPENTIN 400 MG PO CAPS
400.0000 mg | ORAL_CAPSULE | Freq: Three times a day (TID) | ORAL | Status: DC
  Administered 2024-03-18 – 2024-03-21 (×10): 400 mg via ORAL
  Filled 2024-03-18 (×10): qty 1

## 2024-03-18 MED ORDER — ATORVASTATIN CALCIUM 20 MG PO TABS
80.0000 mg | ORAL_TABLET | Freq: Every day | ORAL | Status: DC
  Administered 2024-03-18 – 2024-03-21 (×4): 80 mg via ORAL
  Filled 2024-03-18 (×4): qty 4

## 2024-03-18 MED ORDER — IOHEXOL 300 MG/ML  SOLN
75.0000 mL | Freq: Once | INTRAMUSCULAR | Status: AC | PRN
  Administered 2024-03-18: 75 mL via INTRAVENOUS

## 2024-03-18 MED ORDER — ALPRAZOLAM 0.5 MG PO TABS
2.0000 mg | ORAL_TABLET | Freq: Two times a day (BID) | ORAL | Status: DC
  Administered 2024-03-18 – 2024-03-21 (×6): 2 mg via ORAL
  Filled 2024-03-18 (×6): qty 4

## 2024-03-18 MED ORDER — CITALOPRAM HYDROBROMIDE 20 MG PO TABS
40.0000 mg | ORAL_TABLET | Freq: Every day | ORAL | Status: DC
  Administered 2024-03-18 – 2024-03-21 (×4): 40 mg via ORAL
  Filled 2024-03-18 (×4): qty 2

## 2024-03-18 MED ORDER — CARVEDILOL 6.25 MG PO TABS
6.2500 mg | ORAL_TABLET | Freq: Two times a day (BID) | ORAL | Status: DC
  Administered 2024-03-18 – 2024-03-21 (×7): 6.25 mg via ORAL
  Filled 2024-03-18 (×7): qty 1

## 2024-03-18 MED ORDER — ENOXAPARIN SODIUM 30 MG/0.3ML IJ SOSY
30.0000 mg | PREFILLED_SYRINGE | INTRAMUSCULAR | Status: DC
  Administered 2024-03-19 – 2024-03-21 (×3): 30 mg via SUBCUTANEOUS
  Filled 2024-03-18 (×3): qty 0.3

## 2024-03-18 MED ORDER — BUSPIRONE HCL 10 MG PO TABS
10.0000 mg | ORAL_TABLET | Freq: Two times a day (BID) | ORAL | Status: DC
  Administered 2024-03-18 – 2024-03-21 (×7): 10 mg via ORAL
  Filled 2024-03-18 (×6): qty 1
  Filled 2024-03-18: qty 2

## 2024-03-18 MED ORDER — MOMETASONE FURO-FORMOTEROL FUM 200-5 MCG/ACT IN AERO
2.0000 | INHALATION_SPRAY | Freq: Two times a day (BID) | RESPIRATORY_TRACT | Status: DC
  Administered 2024-03-18 – 2024-03-21 (×7): 2 via RESPIRATORY_TRACT
  Filled 2024-03-18: qty 8.8

## 2024-03-18 MED ORDER — IBUPROFEN 600 MG PO TABS
600.0000 mg | ORAL_TABLET | Freq: Once | ORAL | Status: AC
  Administered 2024-03-18: 600 mg via ORAL
  Filled 2024-03-18: qty 1

## 2024-03-18 NOTE — Evaluation (Signed)
 Physical Therapy Evaluation Patient Details Name: Traci Duran MRN: [id number] DOB: 03/26/1956 Today's Date: 03/18/2024  History of Present Illness  presented to ER secondary to congestion, cough and LEs giving way/falls x3; admitted for management of acute cystitis, recurrent falls  Clinical Impression  Patient resting in bed upon arrival to room; alert and oriented, follows commands and agreeable to participation with session.  Denies pain; does endorse need for toileting.  Generally weak and deconditioned throughout extremities, but no focal weakness appreciated.  Able to complete bed mobility with sup/mod indep; sit/stand, basic transfers and gait (25' x2) without assist device, cga/min assist.  Demonstrates mild forward trunk flexion; broad BOS with choppy, staggered steps. Limited balance reactions; constantly reaching for walls/furniture for support as needed. Min manual cuing for optimal safety/stability. Would benefit from trial of RW in subsequent sessions (limited by bladder incontinence/toileting needs today)  BP generally soft/hypotensive throughout session, but steady with position change. Would benefit from skilled PT to address above deficits and promote optimal return to PLOF.; recommend post-acute PT follow up as indicated by interdisciplinary care team.     Orthostatic VS for the past 24 hrs:  BP- Sitting Pulse- Sitting BP- Standing at 0 minutes Pulse- Standing at 0 minutes  03/18/24 2121 95/84 75 94/71 82            If plan is discharge home, recommend the following: A little help with walking and/or transfers;A little help with bathing/dressing/bathroom   Can travel by private vehicle        Equipment Recommendations Rolling walker (2 wheels)  Recommendations for Other Services       Functional Status Assessment Patient has had a recent decline in their functional status and demonstrates the ability to make significant improvements in function in a  reasonable and predictable amount of time.     Precautions / Restrictions Precautions Precautions: Fall Precaution/Restrictions Comments: chronic compression fractures T4, T9, T12 Restrictions Weight Bearing Restrictions Per Provider Order: No      Mobility  Bed Mobility Overal bed mobility: Modified Independent                  Transfers Overall transfer level: Needs assistance Equipment used: None, 1 person hand held assist Transfers: Sit to/from Stand Sit to Stand: Supervision, Contact guard assist                Ambulation/Gait Ambulation/Gait assistance: Contact guard assist Gait Distance (Feet):  (25' x2) Assistive device: None         General Gait Details: mild forward trunk flexion; broad BOS with choppy, staggered steps.  Limited balance reactions; constantly reaching for walls/furniture for support as needed.  Min manual cuing for optimal safety/stability.  Would benefit from trial of RW in subsequent sessions (limited by bladder incontinence/toileting needs today)  Stairs            Wheelchair Mobility     Tilt Bed    Modified Rankin (Stroke Patients Only)       Balance Overall balance assessment: Needs assistance Sitting-balance support: No upper extremity supported, Feet supported Sitting balance-Leahy Scale: Good     Standing balance support: Reliant on assistive device for balance, During functional activity, Single extremity supported Standing balance-Leahy Scale: Fair                               Pertinent Vitals/Pain Pain Assessment Pain Assessment: No/denies pain    Home Living  Family/patient expects to be discharged to:: Private residence Living Arrangements: Non-relatives/Friends Available Help at Discharge: Family;Available PRN/intermittently;Friend(s) Type of Home: House Home Access: Stairs to enter   [employer] of Steps: 1   Home Layout: One level Home Equipment: Cane - single  Librarian, academic (2 wheels)      Prior Function Prior Level of Function : Independent/Modified Independent;History of Falls (last six months)             Mobility Comments: Ambulatory with SPC at baseline; intermittent use of RW when feeling weak or when falls increase.  Does endorse multiple fall history ADLs Comments: Pt reports being Ind with self care tasks. Grandson assists with grocery shopping. She shares IADL tasks with roommate.     Extremity/Trunk Assessment   Upper Extremity Assessment Upper Extremity Assessment: Generalized weakness    Lower Extremity Assessment Lower Extremity Assessment: Generalized weakness (grossly at least 4/5 throughout; no focal weakness appreciated)       Communication   Communication Communication: No apparent difficulties    Cognition Arousal: Alert Behavior During Therapy: Impulsive                             Following commands: Intact       Cueing Cueing Techniques: Verbal cues, Tactile cues     General Comments      Exercises Other Exercises Other Exercises: Toilet transfer, ambulatory without assist device, cga/min assist; generally impulsive and unsteady.  Min assist for optimal safety throughout.  Does require UE support/grab bar for successful sit/stand from lower (standard toilet) surface height Other Exercises: Dep for management of bladder incontinence during session; min/mod assist to thread, pull undergarments   Assessment/Plan    PT Assessment Patient needs continued PT services  PT Problem List Decreased strength;Decreased activity tolerance;Decreased range of motion;Decreased balance;Decreased mobility;Decreased coordination;Decreased knowledge of use of DME;Decreased safety awareness;Decreased knowledge of precautions;Cardiopulmonary status limiting activity       PT Treatment Interventions DME instruction;Gait training;Patient/family education;Stair training;Functional mobility  training;Therapeutic activities;Therapeutic exercise;Balance training;Cognitive remediation    PT Goals (Current goals can be found in the Care Plan section)  Acute Rehab PT Goals Patient Stated Goal: to return home PT Goal Formulation: With patient Time For Goal Achievement: 04/01/24 Potential to Achieve Goals: Good    Frequency Min 2X/week     Co-evaluation               AM-PAC PT "6 Clicks" Mobility  Outcome Measure Help needed turning from your back to your side while in a flat bed without using bedrails?: None Help needed moving from lying on your back to sitting on the side of a flat bed without using bedrails?: None Help needed moving to and from a bed to a chair (including a wheelchair)?: A Little Help needed standing up from a chair using your arms (e.g., wheelchair or bedside chair)?: A Little Help needed to walk in hospital room?: A Little Help needed climbing 3-5 steps with a railing? : A Little 6 Click Score: 20    End of Session   Activity Tolerance: Patient limited by fatigue Patient left: in chair;with call bell/phone within reach;with chair alarm set Nurse Communication: Mobility status PT Visit Diagnosis: Muscle weakness (generalized) (M62.81);Difficulty in walking, not elsewhere classified (R26.2)    Time: 1610-9604 PT Time Calculation (min) (ACUTE ONLY): 31 min   Charges:   PT Evaluation $PT Eval Moderate Complexity: 1 Mod PT Treatments $Therapeutic Activity: 8-22 mins  PT General Charges $$ ACUTE PT VISIT: 1 Visit        Koya Hunger H. Manson Passey, PT, DPT, NCS 03/18/24, 9:29 PM [phone]

## 2024-03-18 NOTE — ED Notes (Signed)
 Report received, care assumed.

## 2024-03-18 NOTE — Progress Notes (Addendum)
 PROGRESS NOTE    [name]  Duran:[id number] DOB: 06/26/1956 DOA: 03/17/2024 PCP: [name]     Brief Narrative:   From admission h and p  Traci Duran is a 68 y.o. 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 Inspite of this, the only fall that she recalls (besides her HPI falls) is about 3-4 months ago.   For the last couple of weeks, patient described cough and "congestion". Some expectoration of mucous.  no chest pain, no fever. Reports some   sensatin of sob with ambulation. She also described fear of passing out - althgouth this has never actually happened - over the same period of time. She also says that she is incontinent of urine over the same period of time. Last couple of days she has had 3 episodes of her legs giving out while ambulating causing her to fall directly downwards, no truama. Not felt to be due to new weaknes, presycnope or vertigo by patient. She has been nasueas for last 2 days. But no votming, abd pain or diahrea. Patient initiay said she has some pain with mictuition, then denies same.   Patient lives with room mate and is evaluated in ER for frequent falls. Per report - negative orthostatics and patient was markedly unsteady during ambulation trial. Medical eval is sought.    Assessment & Plan:   Principal Problem:   Fall Active Problems:   Chronic obstructive pulmonary disease (HCC)   PAD (peripheral artery disease) (HCC)   Coronary artery disease, non-occlusive   Essential hypertension - check pressures on R arm   Chronic heart failure with preserved ejection fraction (HFpEF) (HCC)   History of GI bleed   Urinary incontinence   Falls   CKD stage 3b, GFR 30-44 ml/min (HCC)  # Fever # Pyelonephritis At least a week of frequency, incontinence. UA equivocal for infection. Febrile and with leukocytosis. No current abd pain but said did have LLQ pain. Covid/flu/rsv neg. No neck  pain or headache and is alert and oriented, doubt meningitis. CT scan obtained this morning and shows uncomplicated left pyelonephritis - f/u blood and urine cultures - continue ceftriaxone (history klebsiella uti sensitive to this)  # Ambulatory dysfunction With low back pain. X-rays of lumbar and thoracic spine show old compression fractures of thoracic spine, no tenderness there, does have some diffuse tenderness around low back - PT/OT consults  # CAD # Tropinemia Initial troponin elevated, no chest pain. Suspect demand - repeat troponin ordered - home atorvastatin  # GAD - home xanax, citalopram, buspar  # Chronic pain - home duloxetine, gabapentin  # CKD 3b Kidney function is at baseline  # HTN Bp mildly elevated - home coreg - home lisinopril on hold  # HFpEF Euvolemic - hold home lasix given plan for IV contrast  # Iron deficiency anemia # History GI bleed Followed by gi and onc as outpt, saw onc earlier this week, possible repeat iron transfusion planned. Hgb 8s here, stable from priors, no report of bleeding - monitor  # COPD No apparent exacerbation, nothing acute on cxr, no hypoxia or dyspnea - dulera for home symbicort   DVT prophylaxis: lovenox Code Status: full Family Communication: none at bedside. Telephoned both listed contacts (grandsons), neither answered.  Level of care: Telemetry Medical Status is: inpt    Consultants:  none  Procedures: none  Antimicrobials:  ceftriaxone    Subjective: Reports feeling fatigued  Objective:  Vitals:   03/18/24 0645 03/18/24 0700 03/18/24 0715 03/18/24 0800  BP:  104/69  (!) 147/75  Pulse: 91 91 90 87  Resp: 16 16 (!) 30 (!) 8  Temp:      TempSrc:      SpO2: 93% 94% 94% 94%  Weight:      Height:       No intake or output data in the 24 hours ending 03/18/24 0850 Filed Weights   03/17/24 1407  Weight: 64 kg    Examination:  General exam: Appears calm and comfortable  Respiratory  system: rhonchi throughout Cardiovascular system: S1 & S2 heard, RRR.   Gastrointestinal system: Abdomen is nondistended, soft and nontender.   Central nervous system: Alert and oriented. No focal neurological deficits. Extremities: Symmetric 5 x 5 power. Skin: No rashes, lesions or ulcers Psychiatry: Judgement and insight appear normal. Mood & affect appropriate.     Data Reviewed: I have personally reviewed following labs and imaging studies  CBC: Recent Labs  Lab 03/14/24 1448 03/17/24 1410 03/18/24 0311  WBC 9.2 14.5* 11.2*  HGB 8.8* 8.0* 8.1*  HCT 29.3* 25.3* 25.7*  MCV 84.7 81.4 82.6  PLT 230 218 190   Basic Metabolic Panel: Recent Labs  Lab 03/17/24 1410 03/18/24 0311  NA 130* 131*  K 3.2* 4.0  CL 99 103  CO2 21* 19*  GLUCOSE 86 90  BUN 18 21  CREATININE 1.47* 1.47*  CALCIUM 8.2* 8.0*  PHOS 1.5*  --    GFR: Estimated Creatinine Clearance: 36.1 mL/min (A) (by C-G formula based on SCr of 1.47 mg/dL (H)). Liver Function Tests: Recent Labs  Lab 03/17/24 1410  AST 14*  ALT 8  ALKPHOS 72  BILITOT 1.0  PROT 6.0*  ALBUMIN 2.8*   No results for input(s): "LIPASE", "AMYLASE" in the last 168 hours. No results for input(s): "AMMONIA" in the last 168 hours. Coagulation Profile: Recent Labs  Lab 03/18/24 0311  INR 1.1   Cardiac Enzymes: Recent Labs  Lab 03/18/24 0311  CKTOTAL 84   BNP (last 3 results) No results for input(s): "PROBNP" in the last 8760 hours. HbA1C: No results for input(s): "HGBA1C" in the last 72 hours. CBG: No results for input(s): "GLUCAP" in the last 168 hours. Lipid Profile: No results for input(s): "CHOL", "HDL", "LDLCALC", "TRIG", "CHOLHDL", "LDLDIRECT" in the last 72 hours. Thyroid Function Tests: Recent Labs    03/18/24 0311  TSH 1.068   Anemia Panel: No results for input(s): "VITAMINB12", "FOLATE", "FERRITIN", "TIBC", "IRON", "RETICCTPCT" in the last 72 hours. Urine analysis:    Component Value Date/Time    COLORURINE YELLOW (A) 03/17/2024 1742   APPEARANCEUR CLOUDY (A) 03/17/2024 1742   LABSPEC 1.004 (L) 03/17/2024 1742   PHURINE 6.0 03/17/2024 1742   GLUCOSEU NEGATIVE 03/17/2024 1742   HGBUR MODERATE (A) 03/17/2024 1742   BILIRUBINUR NEGATIVE 03/17/2024 1742   KETONESUR NEGATIVE 03/17/2024 1742   PROTEINUR 30 (A) 03/17/2024 1742   UROBILINOGEN 0.2 10/15/2011 0840   NITRITE NEGATIVE 03/17/2024 1742   LEUKOCYTESUR LARGE (A) 03/17/2024 1742   Sepsis Labs: @LABRCNTIP (procalcitonin:4,lacticidven:4)  ) Recent Results (from the past 240 hours)  Resp panel by RT-PCR (RSV, Flu A&B, Covid) Anterior Nasal Swab     Status: None   Collection Time: 03/18/24  3:11 AM   Specimen: Anterior Nasal Swab  Result Value Ref Range Status   SARS Coronavirus 2 by RT PCR NEGATIVE NEGATIVE Final    Comment: (NOTE) SARS-CoV-2 target nucleic acids are NOT DETECTED.  The SARS-CoV-2 RNA is generally detectable in upper respiratory specimens during the acute phase of infection. The lowest concentration of SARS-CoV-2 viral copies this assay can detect is 138 copies/mL. A negative result does not preclude SARS-Cov-2 infection and should not be used as the sole basis for treatment or other patient management decisions. A negative result may occur with  improper specimen collection/handling, submission of specimen other than nasopharyngeal swab, presence of viral mutation(s) within the areas targeted by this assay, and inadequate number of viral copies(<138 copies/mL). A negative result must be combined with clinical observations, patient history, and epidemiological information. The expected result is Negative.  Fact Sheet for Patients:  BloggerCourse.com  Fact Sheet for Healthcare Providers:  SeriousBroker.it  This test is no t yet approved or cleared by the [place] FDA and  has been authorized for detection and/or diagnosis of SARS-CoV-2 by FDA under  an Emergency Use Authorization (EUA). This EUA will remain  in effect (meaning this test can be used) for the duration of the COVID-19 declaration under Section 564(b)(1) of the Act, 21 U.S.C.section 360bbb-3(b)(1), unless the authorization is terminated  or revoked sooner.       Influenza A by PCR NEGATIVE NEGATIVE Final   Influenza B by PCR NEGATIVE NEGATIVE Final    Comment: (NOTE) The Xpert Xpress SARS-CoV-2/FLU/RSV plus assay is intended as an aid in the diagnosis of influenza from Nasopharyngeal swab specimens and should not be used as a sole basis for treatment. Nasal washings and aspirates are unacceptable for Xpert Xpress SARS-CoV-2/FLU/RSV testing.  Fact Sheet for Patients: BloggerCourse.com  Fact Sheet for Healthcare Providers: SeriousBroker.it  This test is not yet approved or cleared by the [place] FDA and has been authorized for detection and/or diagnosis of SARS-CoV-2 by FDA under an Emergency Use Authorization (EUA). This EUA will remain in effect (meaning this test can be used) for the duration of the COVID-19 declaration under Section 564(b)(1) of the Act, 21 U.S.C. section 360bbb-3(b)(1), unless the authorization is terminated or revoked.     Resp Syncytial Virus by PCR NEGATIVE NEGATIVE Final    Comment: (NOTE) Fact Sheet for Patients: BloggerCourse.com  Fact Sheet for Healthcare Providers: SeriousBroker.it  This test is not yet approved or cleared by the [place] FDA and has been authorized for detection and/or diagnosis of SARS-CoV-2 by FDA under an Emergency Use Authorization (EUA). This EUA will remain in effect (meaning this test can be used) for the duration of the COVID-19 declaration under Section 564(b)(1) of the Act, 21 U.S.C. section 360bbb-3(b)(1), unless the authorization is terminated or revoked.  Performed at [facility], [address], [place], [place] [address]          Radiology Studies: DG Abd 2 Views Result Date: 03/18/2024 CLINICAL DATA:  Vomiting back pain EXAM: ABDOMEN - 2 VIEW COMPARISON:  09/03/2018 FINDINGS: Few prominent gas-filled loops central small bowel with scattered colon gas and mild stool in the colon. Partially visualized left femoral hardware. Extensive vascular calcifications. No radiopaque calculi. IMPRESSION: Few prominent gas-filled loops of central small bowel with scattered colon gas and mild stool in the colon. Findings are nonspecific, question mild ileus. Electronically Signed   By: Jasmine Pang M.D.   On: 03/18/2024 00:17   DG Thoracic Spine 2 View Result Date: 03/18/2024 CLINICAL DATA:  Multiple fall with back pain EXAM: THORACIC SPINE 2 VIEWS COMPARISON:  CT 09/30/2017 FINDINGS: Thoracic alignment is within normal limits. Severe chronic compression fracture at T4. Mild chronic compression  deformity at T9. Chronic mild superior endplate deformity at T12. Mild degenerative changes. IMPRESSION: Chronic compression fractures at T4, T9, and T12. Electronically Signed   By: Jasmine Pang M.D.   On: 03/18/2024 00:16   DG Lumbar Spine 2-3 Views Result Date: 03/18/2024 CLINICAL DATA:  Vomiting back pain EXAM: LUMBAR SPINE - 2-3 VIEW COMPARISON:  CT 10/09/2022 FINDINGS: Five lumbar type vertebra. Vertebral body heights are maintained. Mild disc space narrowing at L4-L5. Dense aortic atherosclerosis. IMPRESSION: Mild degenerative change at L4-L5. Electronically Signed   By: Jasmine Pang M.D.   On: 03/18/2024 00:14   DG Chest Port 1 View Result Date: 03/18/2024 CLINICAL DATA:  Multiple fall EXAM: PORTABLE CHEST 1 VIEW COMPARISON:  03/26/2023, 10/09/2022 FINDINGS: Borderline to mild cardiomegaly. Diffuse bronchitic changes which appear chronic. No acute airspace disease, pleural effusion or pneumothorax. Chronic fracture deformity of the proximal left humerus. IMPRESSION: No active  disease. Chronic bronchitic changes. Electronically Signed   By: Jasmine Pang M.D.   On: 03/18/2024 00:13   CT Renal Stone Study Result Date: 03/17/2024 CLINICAL DATA:  Left flank pain and dysuria. EXAM: CT ABDOMEN AND PELVIS WITHOUT CONTRAST TECHNIQUE: Multidetector CT imaging of the abdomen and pelvis was performed following the standard protocol without IV contrast. RADIATION DOSE REDUCTION: This exam was performed according to the departmental dose-optimization program which includes automated exposure control, adjustment of the mA and/or kV according to patient size and/or use of iterative reconstruction technique. COMPARISON:  October 09, 2022 FINDINGS: Lower chest: No acute abnormality. Hepatobiliary: No focal liver abnormality is seen. No gallstones, gallbladder wall thickening, or biliary dilatation. Pancreas: Unremarkable. No pancreatic ductal dilatation or surrounding inflammatory changes. Spleen: Numerous punctate parenchymal calcifications are seen scattered throughout the parenchyma of an otherwise normal-appearing spleen. Adrenals/Urinary Tract: Adrenal glands are unremarkable. The right kidney is atrophic in appearance. The left kidney is normal in size, without renal calculi, focal lesion, or hydronephrosis. The urinary bladder is poorly distended and subsequently limited in evaluation. Mild to moderate severity, predominantly anterior urinary bladder wall thickening is noted. Stomach/Bowel: There is a small hiatal hernia. The appendix is surgically absent. No evidence of bowel wall thickening, distention, or inflammatory changes. Numerous noninflamed diverticula are seen throughout the descending and sigmoid colon. Vascular/Lymphatic: Aortic atherosclerosis. No enlarged abdominal or pelvic lymph nodes. Reproductive: Uterus and bilateral adnexa are unremarkable. Other: No abdominal wall hernia or abnormality. No abdominopelvic ascites. Musculoskeletal: A metallic density intramedullary rod and  compression screw device are seen within the proximal left femur. There is associated streak artifact with subsequently limited evaluation of the adjacent osseous and soft tissue structures. Multilevel degenerative changes are seen involving predominantly the lower thoracic spine. IMPRESSION: 1. Anterior urinary bladder wall thickening which may be secondary to underdistention. Correlation with urinalysis is recommended to exclude the presence of an underlying infectious or inflammatory process. 2. Atrophic right kidney. 3. Colonic diverticulosis. 4. Small hiatal hernia. 5. Evidence of prior appendectomy. 6. Aortic atherosclerosis. Electronically Signed   By: Aram Candela M.D.   On: 03/17/2024 21:45        Scheduled Meds:  ALPRAZolam  1 mg Oral BID   citalopram  40 mg Oral Daily   DULoxetine  60 mg Oral Daily   enoxaparin (LOVENOX) injection  40 mg Subcutaneous Q24H   fluticasone furoate-vilanterol  1 puff Inhalation Daily   sodium chloride flush  3 mL Intravenous Q12H   Continuous Infusions:  cefTRIAXone (ROCEPHIN)  IV Stopped (03/18/24 0120)     LOS: 0 days  Silvano Bilis, MD [facility]   If 7PM-7AM, please contact night-coverage [email or web address] Password [facility] 03/18/2024, 8:50 AM

## 2024-03-18 NOTE — ED Notes (Signed)
 Pt cleaned per request. Clean brief applied. Bed locked in lowest position with call light in reach.

## 2024-03-18 NOTE — ED Notes (Signed)
 Moved to [address] from Room 9. Report given. Remains on monitor. CCMD notified. Pt sleeping, arousable to voice, remains alert, NAD, calm, interactive, pt updated, move explained.

## 2024-03-18 NOTE — Evaluation (Signed)
 Occupational Therapy Evaluation Patient Details Name: Traci Duran MRN: [id number] DOB: 12-20-1955 Today's Date: 03/18/2024   History of Present Illness   Traci Duran is a 68 y.o. 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 Inspite of this, the only fall that she recalls (besides her HPI falls) is about 3-4 months ago.     For the last couple of weeks, patient described cough and "congestion". Some expectoration of mucous.  no chest pain, no fever. Reports some   sensatin of sob with ambulation. She also described fear of passing out - althgouth this has never actually happened - over the same period of time. She also says that she is incontinent of urine over the same period of time. Last couple of days she has had 3 episodes of her legs giving out while ambulating causing her to fall directly downwards, no truama. Not felt to be due to new weaknes, presycnope or vertigo by patient. She has been nasueas for last 2 days.     Clinical Impressions Patient presenting with decreased Ind in self care,balance, functional mobility/transfers,endurance, and safety awareness. Patient reports being Ind at baseline without use of AD but does report owning SPC and RW. Pt lives with roommate. They share IADL tasks within the home and her grandson gets groceries and takes her to appointments. Patient currently functioning at min guard- min HHA for ambulation in room and self care tasks in standing at sink. Pt declined toileting needs at this time. She ambulates 40' in room and reports fatigue and returns back to bed. Pt does not endorse being dizzy during session.  Patient will benefit from acute OT to increase overall independence in the areas of ADLs, functional mobility, and safety awareness in order to safely discharge.     If plan is discharge home, recommend the following:   A little help with walking and/or transfers;A little help with  bathing/dressing/bathroom;Assistance with cooking/housework;Assist for transportation;Help with stairs or ramp for entrance;Direct supervision/assist for medications management;Direct supervision/assist for financial management     Functional Status Assessment   Patient has had a recent decline in their functional status and demonstrates the ability to make significant improvements in function in a reasonable and predictable amount of time.     Equipment Recommendations   BSC/3in1      Precautions/Restrictions   Precautions Precautions: Fall     Mobility Bed Mobility Overal bed mobility: Modified Independent             General bed mobility comments: increased time but no physical assistance provided    Transfers Overall transfer level: Needs assistance Equipment used: 1 person hand held assist Transfers: Sit to/from Stand Sit to Stand: Contact guard assist                  Balance Overall balance assessment: Needs assistance Sitting-balance support: Feet supported Sitting balance-Leahy Scale: Fair     Standing balance support: Reliant on assistive device for balance, During functional activity, Single extremity supported Standing balance-Leahy Scale: Fair                             ADL either performed or assessed with clinical judgement   ADL Overall ADL's : Needs assistance/impaired  General ADL Comments: min guard- min HHA for ambulation, functional transfers, and 40' ambulation     Vision Patient Visual Report: No change from baseline              Pertinent Vitals/Pain Pain Assessment Pain Assessment: No/denies pain     Extremity/Trunk Assessment Upper Extremity Assessment Upper Extremity Assessment: Generalized weakness           Communication Communication Communication: No apparent difficulties   Cognition Arousal: Alert Behavior During Therapy:  Impulsive Cognition: No apparent impairments             OT - Cognition Comments: Pt does need cuing for safety awareness during test but seems to be overall oriented during session.                 Following commands: Intact       Cueing  General Comments   Cueing Techniques: Verbal cues;Tactile cues              Home Living Family/patient expects to be discharged to:: Private residence Living Arrangements: Non-relatives/Friends Available Help at Discharge: Family;Available PRN/intermittently;Friend(s) Type of Home: House Home Access: Stairs to enter [employer] of Steps: 1 Entrance Stairs-Rails: None Home Layout: One level     Bathroom Shower/Tub: Chief Strategy Officer: Standard Bathroom Accessibility: No   Home Equipment: Cane - single point;Rolling Walker (2 wheels)          Prior Functioning/Environment Prior Level of Function : Independent/Modified Independent;History of Falls (last six months)               ADLs Comments: Pt reports being Ind with self care tasks. Grandson assists with grocery shopping. She shares IADL tasks with roommate.    OT Problem List: Decreased strength;Decreased activity tolerance;Decreased safety awareness;Impaired balance (sitting and/or standing);Decreased knowledge of use of DME or AE   OT Treatment/Interventions: Self-care/ADL training;Therapeutic exercise;Therapeutic activities;Energy conservation;DME and/or AE instruction;Patient/family education;Balance training;Manual therapy      OT Goals(Current goals can be found in the care plan section)   Acute Rehab OT Goals Patient Stated Goal: to go home OT Goal Formulation: With patient Time For Goal Achievement: 04/01/24 Potential to Achieve Goals: Fair ADL Goals Pt Will Perform Grooming: with modified independence;standing Pt Will Perform Lower Body Dressing: with modified independence;sit to/from stand Pt Will Transfer to  Toilet: with modified independence;ambulating Pt Will Perform Toileting - Clothing Manipulation and hygiene: with modified independence;sit to/from stand   OT Frequency:  Min 2X/week       AM-PAC OT "6 Clicks" Daily Activity     Outcome Measure Help from another person eating meals?: None Help from another person taking care of personal grooming?: None Help from another person toileting, which includes using toliet, bedpan, or urinal?: A Little Help from another person bathing (including washing, rinsing, drying)?: A Little Help from another person to put on and taking off regular upper body clothing?: None Help from another person to put on and taking off regular lower body clothing?: A Little 6 Click Score: 21   End of Session Nurse Communication: Mobility status  Activity Tolerance: Patient tolerated treatment well Patient left: in bed;with call bell/phone within reach;with bed alarm set  OT Visit Diagnosis: Unsteadiness on feet (R26.81);Repeated falls (R29.6);Muscle weakness (generalized) (M62.81);History of falling (Z91.81)                Time: 2956-2130 OT Time Calculation (min): 23 min Charges:  OT General Charges $OT Visit: 1 Visit OT  Evaluation $OT Eval Low Complexity: 1 Low OT Treatments $Self Care/Home Management : 8-22 mins  Jackquline Denmark, MS, OTR/L , CBIS ascom [phone]  03/18/24, 4:26 PM

## 2024-03-19 ENCOUNTER — Inpatient Hospital Stay

## 2024-03-19 DIAGNOSIS — N10 Acute tubulo-interstitial nephritis: Secondary | ICD-10-CM

## 2024-03-19 LAB — BLOOD CULTURE ID PANEL (REFLEXED) - BCID2

## 2024-03-19 LAB — CBC
HCT: 25 % — ABNORMAL LOW (ref 36.0–46.0)
Hemoglobin: 7.8 g/dL — ABNORMAL LOW (ref 12.0–15.0)
MCH: 25.4 pg — ABNORMAL LOW (ref 26.0–34.0)
MCHC: 31.2 g/dL (ref 30.0–36.0)
MCV: 81.4 fL (ref 80.0–100.0)
Platelets: 184 10*3/uL (ref 150–400)
RBC: 3.07 MIL/uL — ABNORMAL LOW (ref 3.87–5.11)
RDW: 16.2 % — ABNORMAL HIGH (ref 11.5–15.5)
WBC: 6.5 10*3/uL (ref 4.0–10.5)
nRBC: 0 % (ref 0.0–0.2)

## 2024-03-19 LAB — BASIC METABOLIC PANEL WITH GFR
Anion gap: 8 (ref 5–15)
BUN: 20 mg/dL (ref 8–23)
CO2: 20 mmol/L — ABNORMAL LOW (ref 22–32)
Calcium: 8.3 mg/dL — ABNORMAL LOW (ref 8.9–10.3)
Chloride: 105 mmol/L (ref 98–111)
Creatinine, Ser: 1.45 mg/dL — ABNORMAL HIGH (ref 0.44–1.00)
GFR, Estimated: 40 mL/min — ABNORMAL LOW (ref >60)
Glucose, Bld: 82 mg/dL (ref 70–99)
Potassium: 4.2 mmol/L (ref 3.5–5.1)
Sodium: 133 mmol/L — ABNORMAL LOW (ref 135–145)

## 2024-03-19 MED ORDER — LISINOPRIL 10 MG PO TABS
10.0000 mg | ORAL_TABLET | Freq: Every day | ORAL | Status: DC
  Administered 2024-03-19 – 2024-03-21 (×3): 10 mg via ORAL
  Filled 2024-03-19 (×4): qty 1

## 2024-03-19 MED ORDER — SODIUM CHLORIDE 0.9 % IV SOLN
2.0000 g | INTRAVENOUS | Status: DC
  Administered 2024-03-19 – 2024-03-20 (×2): 2 g via INTRAVENOUS
  Filled 2024-03-19 (×2): qty 20

## 2024-03-19 MED ORDER — FUROSEMIDE 40 MG PO TABS
40.0000 mg | ORAL_TABLET | Freq: Every day | ORAL | Status: DC
  Administered 2024-03-20 – 2024-03-21 (×2): 40 mg via ORAL
  Filled 2024-03-19 (×2): qty 1

## 2024-03-19 NOTE — Plan of Care (Signed)

## 2024-03-19 NOTE — Progress Notes (Signed)
 PHARMACY - PHYSICIAN COMMUNICATION CRITICAL VALUE ALERT - BLOOD CULTURE IDENTIFICATION (BCID)  Traci Duran is an 68 y.o. female who presented to [facility] on 03/17/2024 with a chief complaint of acute pyelonephritis.  Name of physician (or Provider) Contacted: DR Leory Rands  Current antibiotics: Ceftriaxone 1g, IV q 24 hours  Changes to prescribed antibiotics recommended:  Increase ceftriaxone 1gm IV to 2gm IV q 24 hours.  Results for orders placed or performed during the hospital encounter of 03/17/24  Blood Culture ID Panel (Reflexed) (Collected: 03/18/2024 12:00 AM)  Result Value Ref Range   Enterococcus faecalis NOT DETECTED NOT DETECTED   Enterococcus Faecium NOT DETECTED NOT DETECTED   Listeria monocytogenes NOT DETECTED NOT DETECTED   Staphylococcus species NOT DETECTED NOT DETECTED   Staphylococcus aureus (BCID) NOT DETECTED NOT DETECTED   Staphylococcus epidermidis NOT DETECTED NOT DETECTED   Staphylococcus lugdunensis NOT DETECTED NOT DETECTED   Streptococcus species NOT DETECTED NOT DETECTED   Streptococcus agalactiae NOT DETECTED NOT DETECTED   Streptococcus pneumoniae NOT DETECTED NOT DETECTED   Streptococcus pyogenes NOT DETECTED NOT DETECTED   A.calcoaceticus-baumannii NOT DETECTED NOT DETECTED   Bacteroides fragilis NOT DETECTED NOT DETECTED   Enterobacterales DETECTED (A) NOT DETECTED   Enterobacter cloacae complex NOT DETECTED NOT DETECTED   Escherichia coli DETECTED (A) NOT DETECTED   Klebsiella aerogenes NOT DETECTED NOT DETECTED   Klebsiella oxytoca NOT DETECTED NOT DETECTED   Klebsiella pneumoniae NOT DETECTED NOT DETECTED   Proteus species NOT DETECTED NOT DETECTED   Salmonella species NOT DETECTED NOT DETECTED   Serratia marcescens NOT DETECTED NOT DETECTED   Haemophilus influenzae NOT DETECTED NOT DETECTED   Neisseria meningitidis NOT DETECTED NOT DETECTED   Pseudomonas aeruginosa NOT DETECTED NOT DETECTED   Stenotrophomonas maltophilia NOT DETECTED  NOT DETECTED   Candida albicans NOT DETECTED NOT DETECTED   Candida auris NOT DETECTED NOT DETECTED   Candida glabrata NOT DETECTED NOT DETECTED   Candida krusei NOT DETECTED NOT DETECTED   Candida parapsilosis NOT DETECTED NOT DETECTED   Candida tropicalis NOT DETECTED NOT DETECTED   Cryptococcus neoformans/gattii NOT DETECTED NOT DETECTED   CTX-M ESBL NOT DETECTED NOT DETECTED   Carbapenem resistance IMP NOT DETECTED NOT DETECTED   Carbapenem resistance KPC NOT DETECTED NOT DETECTED   Carbapenem resistance NDM NOT DETECTED NOT DETECTED   Carbapenem resist OXA 48 LIKE NOT DETECTED NOT DETECTED   Carbapenem resistance VIM NOT DETECTED NOT DETECTED   Traci Duran PharmD, BCPS 03/19/2024 6:52 PM

## 2024-03-19 NOTE — Progress Notes (Addendum)
 Progress Note    Traci Duran  NWG:[id number] DOB: 1956/11/20  DOA: 03/17/2024 PCP: [facility]      Brief Narrative:    Medical records reviewed and are as summarized below:  From admission H&P  Traci Duran is a 68 y.o. 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 Inspite of this, the only fall that she recalls (besides her HPI falls) is about 3-4 months ago.   For the last couple of weeks, patient described cough and "congestion". Some expectoration of mucous.  no chest pain, no fever. Reports some   sensatin of sob with ambulation. She also described fear of passing out - althgouth this has never actually happened - over the same period of time. She also says that she is incontinent of urine over the same period of time. Last couple of days she has had 3 episodes of her legs giving out while ambulating causing her to fall directly downwards, no truama. Not felt to be due to new weaknes, presycnope or vertigo by patient. She has been nasueas for last 2 days. But no votming, abd pain or diahrea. Patient initiay said she has some pain with mictuition, then denies same.   Patient lives with room mate and is evaluated in ER for frequent falls. Per report - negative orthostatics and patient was markedly unsteady during ambulation trial. Medical eval is sought.       Assessment/Plan:   Principal Problem:   Acute pyelonephritis Active Problems:   Chronic obstructive pulmonary disease (HCC)   PAD (peripheral artery disease) (HCC)   Coronary artery disease, non-occlusive   Essential hypertension - check pressures on R arm   Chronic heart failure with preserved ejection fraction (HFpEF) (HCC)   History of GI bleed   Urinary incontinence   Falls   Fall   CKD stage 3b, GFR 30-44 ml/min (HCC)    Body mass index is 22.1 kg/m.   # Fever # Pyelonephritis At least a week of frequency, incontinence.   Urine culture growing E. coli.  Follow-up susceptibility report.  No growth on blood cultures.  CT scan shows uncomplicated left pyelonephritis continue ceftriaxone (history klebsiella uti sensitive to this)    # Ambulatory dysfunction With low back pain. X-rays of lumbar and thoracic spine show old compression fractures of thoracic spine, no tenderness there, does have some diffuse tenderness around low back PT and OT recommended home health therapy    # CAD # Tropinemia Initial troponin elevated, no chest pain. Suspect demand - home atorvastatin    # CKD 3b Kidney function is at baseline    # HFpEF Euvolemic BP is elevated Restart oral Lasix and lisinopril.    # Iron deficiency anemia # History GI bleed Followed by gi and onc as outpt, saw onc earlier this week, possible repeat iron transfusion planned.  H&H is stable    # COPD No apparent exacerbation, nothing acute on cxr, no hypoxia or dyspnea - dulera for home symbicort   Left lower extremity pain: Obtain venous duplex to rule out DVT   Comorbidities include: General anxiety disorder, chronic pain on duloxetine and gabapentin, hypertension,              Diet Order             Diet regular Room service appropriate? Yes; Fluid consistency: Thin  Diet effective now  Consultants: None  Procedures: None    Medications:    alprazolam  2 mg Oral BID   atorvastatin  80 mg Oral Daily   busPIRone  10 mg Oral BID   carvedilol  6.25 mg Oral BID   citalopram  40 mg Oral Daily   DULoxetine  60 mg Oral Daily   enoxaparin (LOVENOX) injection  30 mg Subcutaneous Q24H   [START ON 03/20/2024] furosemide  40 mg Oral Daily   gabapentin  400 mg Oral TID   mometasone-formoterol  2 puff Inhalation BID   sodium chloride flush  3 mL Intravenous Q12H   Continuous Infusions:  cefTRIAXone (ROCEPHIN)  IV Stopped (03/19/24 0042)     Anti-infectives (From  admission, onward)    Start     Dose/Rate Route Frequency Ordered Stop   03/17/24 2315  cefTRIAXone (ROCEPHIN) 1 g in sodium chloride 0.9 % 100 mL IVPB        1 g 200 mL/hr over 30 Minutes Intravenous Every 24 hours 03/17/24 2311     03/17/24 2045  cephALEXin (KEFLEX) capsule 500 mg        500 mg Oral  Once 03/17/24 2035 03/17/24 2042              Family Communication/Anticipated D/C date and plan/Code Status   DVT prophylaxis: enoxaparin (LOVENOX) injection 30 mg Start: 03/19/24 0800 SCDs Start: 03/17/24 2318     Code Status: Full Code  Family Communication: None Disposition Plan: Plan to discharge home   Status is: Inpatient Remains inpatient appropriate because: Pyelonephritis       Subjective:   Interval events noted.  She complains of pain in the left left leg and back pain  Objective:    Vitals:   03/18/24 1944 03/19/24 0542 03/19/24 0645 03/19/24 0841  BP: 130/85 (!) 150/113 (!) 155/91 (!) 167/86  Pulse: (!) 107 92 96 98  Resp: 20 20  20   Temp: 98.3 F (36.8 C) 98.5 F (36.9 C)  98.6 F (37 C)  TempSrc: Oral   Oral  SpO2: 96% 100%  100%  Weight:      Height:       Orthostatic VS for the past 24 hrs:  BP- Sitting Pulse- Sitting BP- Standing at 0 minutes Pulse- Standing at 0 minutes  03/18/24 2121 95/84 75 94/71 82     Intake/Output Summary (Last 24 hours) at 03/19/2024 1345 Last data filed at 03/19/2024 1104 Gross per 24 hour  Intake 918.6 ml  Output 1000 ml  Net -81.4 ml   Filed Weights   03/17/24 1407  Weight: 64 kg    Exam:  GEN: NAD SKIN: Warm and dry EYES:  ENT: MMM CV: RRR PULM: CTA B ABD: soft, ND, NT, +BS CNS: AAO x 3, non focal EXT: No edema or tenderness GU: Minimal left CVA tenderness        Data Reviewed:   I have personally reviewed following labs and imaging studies:  Labs: Labs show the following:   Basic Metabolic Panel: Recent Labs  Lab 03/17/24 1410 03/18/24 0311 03/19/24 0502  NA 130*  131* 133*  K 3.2* 4.0 4.2  CL 99 103 105  CO2 21* 19* 20*  GLUCOSE 86 90 82  BUN 18 21 20   CREATININE 1.47* 1.47* 1.45*  CALCIUM 8.2* 8.0* 8.3*  PHOS 1.5*  --   --    GFR Estimated Creatinine Clearance: 36.6 mL/min (A) (by C-G formula based on SCr of 1.45 mg/dL (H)). Liver Function  Tests: Recent Labs  Lab 03/17/24 1410  AST 14*  ALT 8  ALKPHOS 72  BILITOT 1.0  PROT 6.0*  ALBUMIN 2.8*   No results for input(s): "LIPASE", "AMYLASE" in the last 168 hours. No results for input(s): "AMMONIA" in the last 168 hours. Coagulation profile Recent Labs  Lab 03/18/24 0311  INR 1.1    CBC: Recent Labs  Lab 03/14/24 1448 03/17/24 1410 03/18/24 0311 03/19/24 0502  WBC 9.2 14.5* 11.2* 6.5  HGB 8.8* 8.0* 8.1* 7.8*  HCT 29.3* 25.3* 25.7* 25.0*  MCV 84.7 81.4 82.6 81.4  PLT 230 218 190 184   Cardiac Enzymes: Recent Labs  Lab 03/18/24 0311  CKTOTAL 84   BNP (last 3 results) No results for input(s): "PROBNP" in the last 8760 hours. CBG: No results for input(s): "GLUCAP" in the last 168 hours. D-Dimer: No results for input(s): "DDIMER" in the last 72 hours. Hgb A1c: No results for input(s): "HGBA1C" in the last 72 hours. Lipid Profile: No results for input(s): "CHOL", "HDL", "LDLCALC", "TRIG", "CHOLHDL", "LDLDIRECT" in the last 72 hours. Thyroid function studies: Recent Labs    03/18/24 0311  TSH 1.068   Anemia work up: Recent Labs    03/18/24 0311  VITAMINB12 192   Sepsis Labs: Recent Labs  Lab 03/14/24 1448 03/17/24 1410 03/18/24 0311 03/19/24 0502  WBC 9.2 14.5* 11.2* 6.5    Microbiology Recent Results (from the past 240 hours)  Urine Culture (for pregnant, neutropenic or urologic patients or patients with an indwelling urinary catheter)     Status: Abnormal (Preliminary result)   Collection Time: 03/17/24  5:42 PM   Specimen: Urine, Random  Result Value Ref Range Status   Specimen Description   Final    URINE, RANDOM Performed at [facility], [address] [place], [place] [address]    Special Requests   Final    NONE Performed at [facility], [address] [place], [place] [address]    Culture (A)  Final    >=100,000 COLONIES/mL ESCHERICHIA COLI SUSCEPTIBILITIES TO FOLLOW Performed at [facility], [address], [place], [place] [address]    Report Status PENDING  Incomplete  Culture, blood (Routine X 2) w Reflex to ID Panel     Status: None (Preliminary result)   Collection Time: 03/18/24 12:00 AM   Specimen: BLOOD  Result Value Ref Range Status   Specimen Description BLOOD RIGHT ANTECUBITAL  Final   Special Requests   Final    BOTTLES DRAWN AEROBIC AND ANAEROBIC Blood Culture results may not be optimal due to an inadequate volume of blood received in culture bottles   Culture   Final    NO GROWTH 1 DAY Performed at [facility], [address], [place], [place] [address]    Report Status PENDING  Incomplete  Culture, blood (Routine X 2) w Reflex to ID Panel     Status: None (Preliminary result)   Collection Time: 03/18/24 12:05 AM   Specimen: BLOOD  Result Value Ref Range Status   Specimen Description BLOOD LEFT ANTECUBITAL  Final   Special Requests   Final    BOTTLES DRAWN AEROBIC AND ANAEROBIC Blood Culture results may not be optimal due to an inadequate volume of blood received in culture bottles   Culture   Final    NO GROWTH 1 DAY Performed at [facility], [address] [place], [place] [address]    Report Status PENDING  Incomplete  Resp  panel by RT-PCR (RSV, Flu A&B, Covid) Anterior Nasal Swab     Status: None   Collection Time: 03/18/24  3:11 AM   Specimen: Anterior Nasal Swab  Result Value Ref Range Status   SARS Coronavirus 2 by RT PCR NEGATIVE NEGATIVE Final    Comment: (NOTE) SARS-CoV-2 target nucleic acids are NOT DETECTED.  The SARS-CoV-2 RNA is generally detectable in upper respiratory specimens during the acute phase of infection.  The lowest concentration of SARS-CoV-2 viral copies this assay can detect is 138 copies/mL. A negative result does not preclude SARS-Cov-2 infection and should not be used as the sole basis for treatment or other patient management decisions. A negative result may occur with  improper specimen collection/handling, submission of specimen other than nasopharyngeal swab, presence of viral mutation(s) within the areas targeted by this assay, and inadequate number of viral copies(<138 copies/mL). A negative result must be combined with clinical observations, patient history, and epidemiological information. The expected result is Negative.  Fact Sheet for Patients:  BloggerCourse.com  Fact Sheet for Healthcare Providers:  SeriousBroker.it  This test is no t yet approved or cleared by the [place]  FDA and  has been authorized for detection and/or diagnosis of SARS-CoV-2 by FDA under an Emergency Use Authorization (EUA). This EUA will remain  in effect (meaning this test can be used) for the duration of the COVID-19 declaration under Section 564(b)(1) of the Act, 21 U.S.C.section 360bbb-3(b)(1), unless the authorization is terminated  or revoked sooner.       Influenza A by PCR NEGATIVE NEGATIVE Final   Influenza B by PCR NEGATIVE NEGATIVE Final    Comment: (NOTE) The Xpert Xpress SARS-CoV-2/FLU/RSV plus assay is intended as an aid in the diagnosis of influenza from Nasopharyngeal swab specimens and should not be used as a sole basis for treatment. Nasal washings and aspirates are unacceptable for Xpert Xpress SARS-CoV-2/FLU/RSV testing.  Fact Sheet for Patients: BloggerCourse.com  Fact Sheet for Healthcare Providers: SeriousBroker.it  This test is not yet approved or cleared by the [place]  FDA and has been authorized for detection and/or diagnosis of SARS-CoV-2 by FDA  under an Emergency Use Authorization (EUA). This EUA will remain in effect (meaning this test can be used) for the duration of the COVID-19 declaration under Section 564(b)(1) of the Act, 21 U.S.C. section 360bbb-3(b)(1), unless the authorization is terminated or revoked.     Resp Syncytial Virus by PCR NEGATIVE NEGATIVE Final    Comment: (NOTE) Fact Sheet for Patients: BloggerCourse.com  Fact Sheet for Healthcare Providers: SeriousBroker.it  This test is not yet approved or cleared by the [place]  FDA and has been authorized for detection and/or diagnosis of SARS-CoV-2 by FDA under an Emergency Use Authorization (EUA). This EUA will remain in effect (meaning this test can be used) for the duration of the COVID-19 declaration under Section 564(b)(1) of the Act, 21 U.S.C. section 360bbb-3(b)(1), unless the authorization is terminated or revoked.  Performed at [facility], [address] [place], [place] [address]   Respiratory (~20 pathogens) panel by PCR     Status: None   Collection Time: 03/18/24  8:52 AM   Specimen: Nasopharyngeal Swab; Respiratory  Result Value Ref Range Status   Adenovirus NOT DETECTED NOT DETECTED Final   Coronavirus 229E NOT DETECTED NOT DETECTED Final    Comment: (NOTE) The Coronavirus on the Respiratory Panel, DOES NOT test for the novel  Coronavirus (2019 nCoV)    Coronavirus HKU1 NOT DETECTED NOT DETECTED Final  Coronavirus NL63 NOT DETECTED NOT DETECTED Final   Coronavirus OC43 NOT DETECTED NOT DETECTED Final   Metapneumovirus NOT DETECTED NOT DETECTED Final   Rhinovirus / Enterovirus NOT DETECTED NOT DETECTED Final   Influenza A NOT DETECTED NOT DETECTED Final   Influenza B NOT DETECTED NOT DETECTED Final   Parainfluenza Virus 1 NOT DETECTED NOT DETECTED Final   Parainfluenza Virus 2 NOT DETECTED NOT DETECTED Final   Parainfluenza Virus 3 NOT DETECTED NOT DETECTED Final    Parainfluenza Virus 4 NOT DETECTED NOT DETECTED Final   Respiratory Syncytial Virus NOT DETECTED NOT DETECTED Final   Bordetella pertussis NOT DETECTED NOT DETECTED Final   Bordetella Parapertussis NOT DETECTED NOT DETECTED Final   Chlamydophila pneumoniae NOT DETECTED NOT DETECTED Final   Mycoplasma pneumoniae NOT DETECTED NOT DETECTED Final    Comment: Performed at [facility], [address], [place], [place] [address]    Procedures and diagnostic studies:  CT ABDOMEN PELVIS W CONTRAST Result Date: 03/18/2024 CLINICAL DATA:  Left lower quadrant abdominal pain. Left flank pain with dysuria. Fever. Three falls in the past 2 days. Hypotension. Smoker. EXAM: CT ABDOMEN AND PELVIS WITH CONTRAST TECHNIQUE: Multidetector CT imaging of the abdomen and pelvis was performed using the standard protocol following bolus administration of intravenous contrast. RADIATION DOSE REDUCTION: This exam was performed according to the departmental dose-optimization program which includes automated exposure control, adjustment of the mA and/or kV according to patient size and/or use of iterative reconstruction technique. CONTRAST:  75mL OMNIPAQUE IOHEXOL 300 MG/ML  SOLN COMPARISON:  03/17/2024 FINDINGS: Lower chest: Enlarged heart. Atheromatous calcifications, including the coronary arteries and aorta. Minimal bilateral dependent atelectasis. Small to moderate-sized hiatal hernia. Hepatobiliary: No focal liver abnormality is seen. No gallstones, gallbladder wall thickening, or biliary dilatation. Pancreas: Unremarkable. No pancreatic ductal dilatation or surrounding inflammatory changes. Spleen: Multiple calcified granulomata.  Normal in size and shape. Adrenals/Urinary Tract: Stable marked right renal atrophy and mild compensatory hypertrophy of the left kidney. Small simple appearing right renal cyst. This does not need imaging follow-up. Multiple patchy and geographical areas of low density in the left renal  parenchyma. Mild left perinephric soft tissue stranding on the left is slightly less prominent and stable minimal right perinephric soft tissue stranding. Unremarkable ureters and urinary bladder. No urinary tract calculi or hydronephrosis seen. Stomach/Bowel: Small to moderate-sized hiatal hernia. Multiple proximal sigmoid colon diverticula without evidence of diverticulitis. Surgically absent appendix. Unremarkable small bowel. Vascular/Lymphatic: Marked atheromatous arterial calcifications without aneurysm, including the origins of both renal arteries and more peripheral portions of both renal arteries. No enlarged lymph nodes. Reproductive: Uterus and bilateral adnexa are unremarkable. Other: No abdominal wall hernia or abnormality. No abdominopelvic ascites. Musculoskeletal: Proximal left femur fixation hardware. Mild lumbar and lower thoracic spine degenerative changes. Mild, chronic lower thoracic spine compression deformities without significant change. IMPRESSION: 1. Left pyelonephritis without abscess. 2. Stable marked right renal atrophy and mild compensatory hypertrophy of the left kidney. 3. Small to moderate-sized hiatal hernia. 4. Sigmoid diverticulosis. 5. Cardiomegaly. 6. Marked atherosclerotic calcifications, including the coronary arteries, renal arteries and aorta. Electronically Signed   By: Catherin Closs M.D.   On: 03/18/2024 11:22   DG Abd 2 Views Result Date: 03/18/2024 CLINICAL DATA:  Vomiting back pain EXAM: ABDOMEN - 2 VIEW COMPARISON:  09/03/2018 FINDINGS: Few prominent gas-filled loops central small bowel with scattered colon gas and mild stool in the colon. Partially visualized left femoral hardware. Extensive vascular calcifications. No radiopaque calculi. IMPRESSION: Few prominent  gas-filled loops of central small bowel with scattered colon gas and mild stool in the colon. Findings are nonspecific, question mild ileus. Electronically Signed   By: Esmeralda Hedge M.D.   On: 03/18/2024  00:17   DG Thoracic Spine 2 View Result Date: 03/18/2024 CLINICAL DATA:  Multiple fall with back pain EXAM: THORACIC SPINE 2 VIEWS COMPARISON:  CT 09/30/2017 FINDINGS: Thoracic alignment is within normal limits. Severe chronic compression fracture at T4. Mild chronic compression deformity at T9. Chronic mild superior endplate deformity at T12. Mild degenerative changes. IMPRESSION: Chronic compression fractures at T4, T9, and T12. Electronically Signed   By: Esmeralda Hedge M.D.   On: 03/18/2024 00:16   DG Lumbar Spine 2-3 Views Result Date: 03/18/2024 CLINICAL DATA:  Vomiting back pain EXAM: LUMBAR SPINE - 2-3 VIEW COMPARISON:  CT 10/09/2022 FINDINGS: Five lumbar type vertebra. Vertebral body heights are maintained. Mild disc space narrowing at L4-L5. Dense aortic atherosclerosis. IMPRESSION: Mild degenerative change at L4-L5. Electronically Signed   By: Esmeralda Hedge M.D.   On: 03/18/2024 00:14   DG Chest Port 1 View Result Date: 03/18/2024 CLINICAL DATA:  Multiple fall EXAM: PORTABLE CHEST 1 VIEW COMPARISON:  03/26/2023, 10/09/2022 FINDINGS: Borderline to mild cardiomegaly. Diffuse bronchitic changes which appear chronic. No acute airspace disease, pleural effusion or pneumothorax. Chronic fracture deformity of the proximal left humerus. IMPRESSION: No active disease. Chronic bronchitic changes. Electronically Signed   By: Esmeralda Hedge M.D.   On: 03/18/2024 00:13   CT Renal Stone Study Result Date: 03/17/2024 CLINICAL DATA:  Left flank pain and dysuria. EXAM: CT ABDOMEN AND PELVIS WITHOUT CONTRAST TECHNIQUE: Multidetector CT imaging of the abdomen and pelvis was performed following the standard protocol without IV contrast. RADIATION DOSE REDUCTION: This exam was performed according to the departmental dose-optimization program which includes automated exposure control, adjustment of the mA and/or kV according to patient size and/or use of iterative reconstruction technique. COMPARISON:  October 09, 2022 FINDINGS: Lower chest: No acute abnormality. Hepatobiliary: No focal liver abnormality is seen. No gallstones, gallbladder wall thickening, or biliary dilatation. Pancreas: Unremarkable. No pancreatic ductal dilatation or surrounding inflammatory changes. Spleen: Numerous punctate parenchymal calcifications are seen scattered throughout the parenchyma of an otherwise normal-appearing spleen. Adrenals/Urinary Tract: Adrenal glands are unremarkable. The right kidney is atrophic in appearance. The left kidney is normal in size, without renal calculi, focal lesion, or hydronephrosis. The urinary bladder is poorly distended and subsequently limited in evaluation. Mild to moderate severity, predominantly anterior urinary bladder wall thickening is noted. Stomach/Bowel: There is a small hiatal hernia. The appendix is surgically absent. No evidence of bowel wall thickening, distention, or inflammatory changes. Numerous noninflamed diverticula are seen throughout the descending and sigmoid colon. Vascular/Lymphatic: Aortic atherosclerosis. No enlarged abdominal or pelvic lymph nodes. Reproductive: Uterus and bilateral adnexa are unremarkable. Other: No abdominal wall hernia or abnormality. No abdominopelvic ascites. Musculoskeletal: A metallic density intramedullary rod and compression screw device are seen within the proximal left femur. There is associated streak artifact with subsequently limited evaluation of the adjacent osseous and soft tissue structures. Multilevel degenerative changes are seen involving predominantly the lower thoracic spine. IMPRESSION: 1. Anterior urinary bladder wall thickening which may be secondary to underdistention. Correlation with urinalysis is recommended to exclude the presence of an underlying infectious or inflammatory process. 2. Atrophic right kidney. 3. Colonic diverticulosis. 4. Small hiatal hernia. 5. Evidence of prior appendectomy. 6. Aortic atherosclerosis. Electronically  Signed   By: Virgle Grime M.D.   On: 03/17/2024 21:45  LOS: 1 day   Manali Mcelmurry  [facility]   Pager on [email or web address]. If 7PM-7AM, please contact night-coverage at [email or web address]     03/19/2024, 1:45 PM

## 2024-03-19 NOTE — Progress Notes (Signed)
 Mobility Specialist - Progress Note   03/19/24 1053  Mobility  Activity Dangled on edge of bed  Level of Assistance Minimal assist, patient does 75% or more  Assistive Device None  Distance Ambulated (ft) 0 ft  Activity Response Tolerated well  Mobility Referral Yes  Mobility visit 1 Mobility  Mobility Specialist Start Time (ACUTE ONLY) 1021  Mobility Specialist Stop Time (ACUTE ONLY) 1042  Mobility Specialist Time Calculation (min) (ACUTE ONLY) 21 min   Pt semi-supine on 2L upon arrival. Pt completes bed mobility MinA with extra time. Pt unable to maintain balance EOB indep. Pt defers any further mobility due to feeling weaker today. RN notified. Pt returns to bed with needs in reach and bed alarm activated.   Wash Hack  Mobility Specialist  03/19/24 11:34 AM

## 2024-03-19 NOTE — TOC Initial Note (Addendum)
 Transition of Care [facility]) - Initial/Assessment Note    Patient Details  Name: Traci Duran MRN: [id number] Date of Birth: [birth year]  Transition of [facility]) CM/SW Contact:    Holland Lundborg, RN Phone Number: 03/19/2024, 4:11 PM  Clinical Narrative:                  Patient admitted from home with roommate, state family will provide transportation back home once ready for discharge.  [facility] in [place] provides primary care, family provides transportation to appointments.  [facility].  Aware of recommendations for home health PT/OT as well as for rolling walker.  State she already has walker at home, but open to home health services, does not have preference. Referral sent to [facility] with [facility], accepted.   Expected Discharge Plan: Home w Home Health Services Barriers to Discharge: Continued Medical Work up   Patient Goals and CMS Choice Patient states their goals for this hospitalization and ongoing recovery are:: Home with home health          Expected Discharge Plan and Services     Post Acute Care Choice: Home Health Living arrangements for the past 2 months: Single Family Home                                      Prior Living Arrangements/Services Living arrangements for the past 2 months: Single Family Home Lives with:: Roommate Patient language and need for interpreter reviewed:: Yes Do you feel safe going back to the place where you live?: Yes      Need for Family Participation in Patient Care: Yes (Comment)   Current home services: DME Criminal Activity/Legal Involvement Pertinent to Current Situation/Hospitalization: No - Comment as needed  Activities of Daily Living   ADL Screening (condition at time of admission) Independently performs ADLs?: Yes (appropriate for developmental age) Is the patient deaf or have difficulty hearing?: No Does the patient have difficulty seeing, even when wearing  glasses/contacts?: No Does the patient have difficulty concentrating, remembering, or making decisions?: No  Permission Sought/Granted   Permission granted to share information with : Yes, Verbal Permission Granted              Emotional Assessment       Orientation: : Oriented to Self, Oriented to Place, Oriented to  Time, Oriented to Situation   Psych Involvement: No (comment)  Admission diagnosis:  Fall [W19.XXXA] Generalized weakness [R53.1] Cystitis [N30.90] Chronic congestive heart failure, unspecified heart failure type [facility]) [I50.9] Patient Active Problem List   Diagnosis Date Noted   CKD stage 3b, GFR 30-44 ml/min (HCC) 03/18/2024   Acute pyelonephritis 03/18/2024   Urinary incontinence 03/17/2024   Falls 03/17/2024   Fall 03/17/2024   Closed fracture of right olecranon process 09/28/2023   AVM (arteriovenous malformation) of small bowel, 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/07/2023   AKI (acute kidney injury) (HCC) 10/10/2022   Acute lower UTI 10/10/2022   Anxiety and depression 10/10/2022   Anemia, blood loss 10/10/2022   GI bleeding 10/09/2022   Carotid stenosis 01/08/2022   Moderate to severe mitral regurgitation 09/26/2021   Iron deficiency anemia 07/31/2020   History of GI bleed 07/17/2020   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   Trochanteric fracture of right femur (HCC) 11/13/2019   Occult GI bleeding    Symptomatic anemia 10/06/2019   Chronic heart failure with preserved ejection fraction (  HFpEF) (HCC) 05/16/2019   Rectal bleed 11/19/2018   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   Benzodiazepine abuse (HCC) 09/03/2018   Moderate recurrent major depression (HCC) 09/03/2018   Hyponatremia 10/11/2017   Chest pain 07/28/2017   Acute blood loss anemia 07/28/2017   Demand ischemia (HCC)    Benign neoplasm of descending colon    Polyp of sigmoid colon    Age related osteoporosis 04/16/2017   Iron deficiency anemia secondary to blood loss (chronic)     GI bleed 04/06/2017   Diastolic dysfunction 09/30/2016   Opioid contract exists 06/27/2016   Incidental lung nodule 01/18/2016   Traumatic hemorrhage of cerebrum (HCC) 12/04/2015   Hx of traumatic brain injury 12/04/2015   Hypovitaminosis D 08/29/2015   Thyroid lesion 08/29/2015   Major depressive disorder, single episode, unspecified 08/27/2015   Basal ganglia hemorrhage (HCC) 08/25/2015   Gastroesophageal reflux disease 01/26/2015   Osteopenia 01/26/2015   Anemia, unspecified 12/27/2014   History of fall 12/27/2014   Stenosis of left subclavian artery (HCC) 09/26/2014   Greater tuberosity of humerus fracture 10/24/2013   AVF (arteriovenous fistula) (HCC) 11/08/2011   Anxiety 10/24/2011   Cerebrovascular disease 10/24/2011   Dyslipidemia 10/24/2011   Essential hypertension - check pressures on R arm 10/24/2011   Tobacco abuse 10/24/2011   Palpitations 10/24/2011   Renal artery stenosis (HCC) 10/24/2011   Unstable angina (HCC) 10/14/2011   Hyperlipidemia LDL goal <70    Chronic obstructive pulmonary disease (HCC)    Depression    PAD (peripheral artery disease) (HCC)    Coronary artery disease, non-occlusive    Bilateral carotid artery stenosis without cerebral infarction 10/2010   PCP:  [facility]:   [phone] [place], [place] - [address] [place] [address] Phone: [phone] Fax: [phone]  [facility] - [place], [place] - [address] [place] [address] Phone: [phone] Fax: [phone]  [id number] [place], [place] - [address] [place] [address] Phone: [phone] Fax: [phone]     Social Drivers of Health (SDOH) Social History: SDOH Screenings   Food Insecurity: No Food Insecurity (03/18/2024)  Housing: High Risk (03/18/2024)  Transportation Needs: Unmet Transportation Needs (03/18/2024)  Utilities: Not At Risk (03/18/2024)   Financial Resource Strain: Low Risk  (06/03/2022)   [facility] from [facility], [facility]  Physical Activity: Inactive (11/09/2020)   Received from [facility], [facility]  Social Connections: Socially Isolated (03/18/2024)  Stress: Stress Concern Present (11/09/2020)   Received from [facility], [facility]  Tobacco Use: High Risk (03/17/2024)   SDOH Interventions:     Readmission Risk Interventions     No data to display

## 2024-03-20 DIAGNOSIS — N10 Acute tubulo-interstitial nephritis: Secondary | ICD-10-CM | POA: Diagnosis not present

## 2024-03-20 DIAGNOSIS — B9620 Unspecified Escherichia coli [E. coli] as the cause of diseases classified elsewhere: Secondary | ICD-10-CM | POA: Diagnosis present

## 2024-03-20 LAB — BASIC METABOLIC PANEL WITH GFR
Anion gap: 9 (ref 5–15)
BUN: 16 mg/dL (ref 8–23)
CO2: 20 mmol/L — ABNORMAL LOW (ref 22–32)
Calcium: 8.2 mg/dL — ABNORMAL LOW (ref 8.9–10.3)
Chloride: 102 mmol/L (ref 98–111)
Creatinine, Ser: 1.48 mg/dL — ABNORMAL HIGH (ref 0.44–1.00)
GFR, Estimated: 39 mL/min — ABNORMAL LOW
Glucose, Bld: 82 mg/dL (ref 70–99)
Potassium: 3.8 mmol/L (ref 3.5–5.1)
Sodium: 131 mmol/L — ABNORMAL LOW (ref 135–145)

## 2024-03-20 LAB — URINE CULTURE: Culture: >=100000 — AB

## 2024-03-20 LAB — CBC
HCT: 25.7 % — ABNORMAL LOW (ref 36.0–46.0)
Hemoglobin: 8.1 g/dL — ABNORMAL LOW (ref 12.0–15.0)
MCH: 25.5 pg — ABNORMAL LOW (ref 26.0–34.0)
MCHC: 31.5 g/dL (ref 30.0–36.0)
MCV: 80.8 fL (ref 80.0–100.0)
Platelets: 192 K/uL (ref 150–400)
RBC: 3.18 MIL/uL — ABNORMAL LOW (ref 3.87–5.11)
RDW: 16.4 % — ABNORMAL HIGH (ref 11.5–15.5)
WBC: 6.7 K/uL (ref 4.0–10.5)
nRBC: 0 % (ref 0.0–0.2)

## 2024-03-20 NOTE — Progress Notes (Addendum)
 Progress Note    Traci Duran  ZOX:[id number] DOB: [birth year]  DOA: 03/17/2024 PCP: [facility]      Brief Narrative:    Medical records reviewed and are as summarized below:  From admission H&P  Traci Duran is a 68 y.o. 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 Inspite of this, the only fall that she recalls (besides her HPI falls) is about 3-4 months ago.   For the last couple of weeks, patient described cough and "congestion". Some expectoration of mucous.  no chest pain, no fever. Reports some   sensatin of sob with ambulation. She also described fear of passing out - althgouth this has never actually happened - over the same period of time. She also says that she is incontinent of urine over the same period of time. Last couple of days she has had 3 episodes of her legs giving out while ambulating causing her to fall directly downwards, no truama. Not felt to be due to new weaknes, presycnope or vertigo by patient. She has been nasueas for last 2 days. But no votming, abd pain or diahrea. Patient initiay said she has some pain with mictuition, then denies same.   Patient lives with room mate and is evaluated in ER for frequent falls. Per report - negative orthostatics and patient was markedly unsteady during ambulation trial. Medical eval is sought.       Assessment/Plan:   Principal Problem:   Acute pyelonephritis Active Problems:   E coli bacteremia   Chronic obstructive pulmonary disease (HCC)   PAD (peripheral artery disease) (HCC)   Coronary artery disease, non-occlusive   Essential hypertension - check pressures on R arm   Chronic heart failure with preserved ejection fraction (HFpEF) (HCC)   History of GI bleed   Urinary incontinence   Falls   Fall   CKD stage 3b, GFR 30-44 ml/min (HCC)    Body mass index is 22.1 kg/m.   # Fever, acute pyelonephritis, E. coli  bacteremia Fever has improved Urine urine and blood culture showed E. coli.  Continue IV ceftriaxone for another day with plan to discharge on amoxicillin 1 g 3 times daily. CT scan showed uncomplicated left pyelonephritis History of Klebsiella UTI in July 2024 and November 2023    # Ambulatory dysfunction With low back pain. X-rays of lumbar and thoracic spine show old compression fractures of thoracic spine, no tenderness there, does have some diffuse tenderness around low back PT and OT recommended home health therapy    # CAD # Tropinemia Initial troponin elevated, no chest pain. Suspect demand - home atorvastatin    # CKD 3b Kidney function is at baseline    # HFpEF Euvolemic BP is stable. Continue Lasix and lisinopril    # Iron deficiency anemia # History GI bleed Followed by gi and onc as outpt, saw onc earlier this week, possible repeat iron transfusion planned.  H&H is stable    # COPD No apparent exacerbation, nothing acute on cxr, no hypoxia or dyspnea - dulera for home symbicort   Chronic low back pain, left lower extremity pain: Venous duplex negative for DVT of lower extremities.  Left lower extremity pain is likely pain from sciatica since pain radiates from lower back to left posterior thigh and leg.   Analgesics as needed for pain.   Comorbidities include: General anxiety disorder, chronic pain on duloxetine and gabapentin, hypertension,  Diet Order             Diet regular Room service appropriate? Yes; Fluid consistency: Thin  Diet effective now                            Consultants: None  Procedures: None    Medications:    alprazolam  2 mg Oral BID   atorvastatin  80 mg Oral Daily   busPIRone  10 mg Oral BID   carvedilol  6.25 mg Oral BID   citalopram  40 mg Oral Daily   DULoxetine  60 mg Oral Daily   enoxaparin (LOVENOX) injection  30 mg Subcutaneous Q24H   furosemide  40 mg Oral Daily    gabapentin  400 mg Oral TID   lisinopril  10 mg Oral Daily   mometasone-formoterol  2 puff Inhalation BID   sodium chloride flush  3 mL Intravenous Q12H   Continuous Infusions:  cefTRIAXone (ROCEPHIN)  IV 2 g (03/19/24 2050)     Anti-infectives (From admission, onward)    Start     Dose/Rate Route Frequency Ordered Stop   03/19/24 2000  cefTRIAXone (ROCEPHIN) 2 g in sodium chloride 0.9 % 100 mL IVPB        2 g 200 mL/hr over 30 Minutes Intravenous Every 24 hours 03/19/24 1850     03/17/24 2315  cefTRIAXone (ROCEPHIN) 1 g in sodium chloride 0.9 % 100 mL IVPB  Status:  Discontinued        1 g 200 mL/hr over 30 Minutes Intravenous Every 24 hours 03/17/24 2311 03/19/24 1850   03/17/24 2045  cephALEXin (KEFLEX) capsule 500 mg        500 mg Oral  Once 03/17/24 2035 03/17/24 2042              Family Communication/Anticipated D/C date and plan/Code Status   DVT prophylaxis: enoxaparin (LOVENOX) injection 30 mg Start: 03/19/24 0800 SCDs Start: 03/17/24 2318     Code Status: Full Code  Family Communication: None Disposition Plan: Plan to discharge home   Status is: Inpatient Remains inpatient appropriate because: Pyelonephritis       Subjective:   Interval events noted.  She feels better today.  She still has some pain on the posterior aspect of the left thigh and left leg.  She has chronic low back pain as well.  She expressed her gratitude for the care she has received so far in the hospital.  Objective:    Vitals:   03/19/24 1520 03/19/24 2008 03/20/24 0353 03/20/24 0740  BP: 133/83 138/82 139/83 138/88  Pulse: 82 89 89 86  Resp: 16 20 18 18   Temp: 98 F (36.7 C) 98.8 F (37.1 C) 98.4 F (36.9 C) 98.2 F (36.8 C)  TempSrc:  Oral Oral Oral  SpO2: 100% 94% 95% 97%  Weight:      Height:       No data found.    Intake/Output Summary (Last 24 hours) at 03/20/2024 1221 Last data filed at 03/20/2024 1046 Gross per 24 hour  Intake 240 ml  Output 800  ml  Net -560 ml   Filed Weights   03/17/24 1407  Weight: 64 kg    Exam:  GEN: NAD SKIN: Warm and dry EYES: No pallor or icterus ENT: MMM CV: RRR PULM: CTA B ABD: soft, ND, NT, +BS CNS: AAO x 3, non focal EXT: No edema or tenderness GU: No CVA tenderness  Data Reviewed:   I have personally reviewed following labs and imaging studies:  Labs: Labs show the following:   Basic Metabolic Panel: Recent Labs  Lab 03/17/24 1410 03/18/24 0311 03/19/24 0502 03/20/24 0550  NA 130* 131* 133* 131*  K 3.2* 4.0 4.2 3.8  CL 99 103 105 102  CO2 21* 19* 20* 20*  GLUCOSE 86 90 82 82  BUN 18 21 20 16   CREATININE 1.47* 1.47* 1.45* 1.48*  CALCIUM 8.2* 8.0* 8.3* 8.2*  PHOS 1.5*  --   --   --    GFR Estimated Creatinine Clearance: 35.9 mL/min (A) (by C-G formula based on SCr of 1.48 mg/dL (H)). Liver Function Tests: Recent Labs  Lab 03/17/24 1410  AST 14*  ALT 8  ALKPHOS 72  BILITOT 1.0  PROT 6.0*  ALBUMIN 2.8*   No results for input(s): "LIPASE", "AMYLASE" in the last 168 hours. No results for input(s): "AMMONIA" in the last 168 hours. Coagulation profile Recent Labs  Lab 03/18/24 0311  INR 1.1    CBC: Recent Labs  Lab 03/14/24 1448 03/17/24 1410 03/18/24 0311 03/19/24 0502 03/20/24 0550  WBC 9.2 14.5* 11.2* 6.5 6.7  HGB 8.8* 8.0* 8.1* 7.8* 8.1*  HCT 29.3* 25.3* 25.7* 25.0* 25.7*  MCV 84.7 81.4 82.6 81.4 80.8  PLT 230 218 190 184 192   Cardiac Enzymes: Recent Labs  Lab 03/18/24 0311  CKTOTAL 84   BNP (last 3 results) No results for input(s): "PROBNP" in the last 8760 hours. CBG: No results for input(s): "GLUCAP" in the last 168 hours. D-Dimer: No results for input(s): "DDIMER" in the last 72 hours. Hgb A1c: No results for input(s): "HGBA1C" in the last 72 hours. Lipid Profile: No results for input(s): "CHOL", "HDL", "LDLCALC", "TRIG", "CHOLHDL", "LDLDIRECT" in the last 72 hours. Thyroid function studies: Recent Labs    03/18/24 0311   TSH 1.068   Anemia work up: Recent Labs    03/18/24 0311  VITAMINB12 192   Sepsis Labs: Recent Labs  Lab 03/17/24 1410 03/18/24 0311 03/19/24 0502 03/20/24 0550  WBC 14.5* 11.2* 6.5 6.7    Microbiology Recent Results (from the past 240 hours)  Urine Culture (for pregnant, neutropenic or urologic patients or patients with an indwelling urinary catheter)     Status: Abnormal   Collection Time: 03/17/24  5:42 PM   Specimen: Urine, Random  Result Value Ref Range Status   Specimen Description   Final    URINE, RANDOM Performed at [facility], [address], [place], [place] [address]    Special Requests   Final    NONE Performed at [facility], [address] [place], [place] [address]    Culture >=100,000 COLONIES/mL ESCHERICHIA COLI (A)  Final   Report Status 03/20/2024 FINAL  Final   Organism ID, Bacteria ESCHERICHIA COLI (A)  Final      Susceptibility   Escherichia coli - MIC*    AMPICILLIN <=2 SENSITIVE Sensitive     CEFAZOLIN <=4 SENSITIVE Sensitive     CEFEPIME <=0.12 SENSITIVE Sensitive     CEFTRIAXONE <=0.25 SENSITIVE Sensitive     CIPROFLOXACIN <=0.25 SENSITIVE Sensitive     GENTAMICIN <=1 SENSITIVE Sensitive     IMIPENEM <=0.25 SENSITIVE Sensitive     NITROFURANTOIN <=16 SENSITIVE Sensitive     TRIMETH/SULFA <=20 SENSITIVE Sensitive     AMPICILLIN/SULBACTAM <=2 SENSITIVE Sensitive     PIP/TAZO <=4 SENSITIVE Sensitive ug/mL    * >=100,000 COLONIES/mL ESCHERICHIA COLI  Culture, blood (  Routine X 2) w Reflex to ID Panel     Status: None (Preliminary result)   Collection Time: 03/18/24 12:00 AM   Specimen: BLOOD  Result Value Ref Range Status   Specimen Description   Final    BLOOD RIGHT ANTECUBITAL Performed at [facility], [address] [place], [place] [address]    Special Requests   Final    BOTTLES DRAWN AEROBIC AND ANAEROBIC Blood Culture results may not be optimal due to an inadequate volume of blood received in  culture bottles Performed at [facility], [address] [place], [place] [address]    Culture  Setup Time   Final    GRAM NEGATIVE RODS AEROBIC BOTTLE ONLY CRITICAL RESULT CALLED TO, READ BACK BY AND VERIFIED WITH: RAQUEL RODRIGUEZ GUZMAN ON 03/19/24 AT 1746 QSD    Culture GRAM NEGATIVE RODS  Final   Report Status PENDING  Incomplete  Blood Culture ID Panel (Reflexed)     Status: Abnormal   Collection Time: 03/18/24 12:00 AM  Result Value Ref Range Status   Enterococcus faecalis NOT DETECTED NOT DETECTED Final   Enterococcus Faecium NOT DETECTED NOT DETECTED Final   Listeria monocytogenes NOT DETECTED NOT DETECTED Final   Staphylococcus species NOT DETECTED NOT DETECTED Final   Staphylococcus aureus (BCID) NOT DETECTED NOT DETECTED Final   Staphylococcus epidermidis NOT DETECTED NOT DETECTED Final   Staphylococcus lugdunensis NOT DETECTED NOT DETECTED Final   Streptococcus species NOT DETECTED NOT DETECTED Final   Streptococcus agalactiae NOT DETECTED NOT DETECTED Final   Streptococcus pneumoniae NOT DETECTED NOT DETECTED Final   Streptococcus pyogenes NOT DETECTED NOT DETECTED Final   A.calcoaceticus-baumannii NOT DETECTED NOT DETECTED Final   Bacteroides fragilis NOT DETECTED NOT DETECTED Final   Enterobacterales DETECTED (A) NOT DETECTED Final    Comment: Enterobacterales represent a large order of gram negative bacteria, not a single organism. CRITICAL RESULT CALLED TO, READ BACK BY AND VERIFIED WITH: RAQUEL RODRIGUEZ GUZMAN ON 03/19/24 AT 1746 QSD    Enterobacter cloacae complex NOT DETECTED NOT DETECTED Final   Escherichia coli DETECTED (A) NOT DETECTED Final    Comment: CRITICAL RESULT CALLED TO, READ BACK BY AND VERIFIED WITH: RAQUEL RODRIGUEZ GUZMAN ON 03/19/24 AT 1746 QSD    Klebsiella aerogenes NOT DETECTED NOT DETECTED Final   Klebsiella oxytoca NOT DETECTED NOT DETECTED Final   Klebsiella pneumoniae NOT DETECTED NOT DETECTED Final   Proteus species NOT  DETECTED NOT DETECTED Final   Salmonella species NOT DETECTED NOT DETECTED Final   Serratia marcescens NOT DETECTED NOT DETECTED Final   Haemophilus influenzae NOT DETECTED NOT DETECTED Final   Neisseria meningitidis NOT DETECTED NOT DETECTED Final   Pseudomonas aeruginosa NOT DETECTED NOT DETECTED Final   Stenotrophomonas maltophilia NOT DETECTED NOT DETECTED Final   Candida albicans NOT DETECTED NOT DETECTED Final   Candida auris NOT DETECTED NOT DETECTED Final   Candida glabrata NOT DETECTED NOT DETECTED Final   Candida krusei NOT DETECTED NOT DETECTED Final   Candida parapsilosis NOT DETECTED NOT DETECTED Final   Candida tropicalis NOT DETECTED NOT DETECTED Final   Cryptococcus neoformans/gattii NOT DETECTED NOT DETECTED Final   CTX-M ESBL NOT DETECTED NOT DETECTED Final   Carbapenem resistance IMP NOT DETECTED NOT DETECTED Final   Carbapenem resistance KPC NOT DETECTED NOT DETECTED Final   Carbapenem resistance NDM NOT DETECTED NOT DETECTED Final   Carbapenem resist OXA 48 LIKE NOT DETECTED NOT DETECTED Final   Carbapenem resistance VIM NOT DETECTED NOT DETECTED  Final    Comment: Performed at [facility], [address] [place], [place] [address]  Culture, blood (Routine X 2) w Reflex to ID Panel     Status: None (Preliminary result)   Collection Time: 03/18/24 12:05 AM   Specimen: BLOOD  Result Value Ref Range Status   Specimen Description BLOOD LEFT ANTECUBITAL  Final   Special Requests   Final    BOTTLES DRAWN AEROBIC AND ANAEROBIC Blood Culture results may not be optimal due to an inadequate volume of blood received in culture bottles   Culture   Final    NO GROWTH 2 DAYS Performed at [facility], [address] [place], [place] [address]    Report Status PENDING  Incomplete  Resp panel by RT-PCR (RSV, Flu A&B, Covid) Anterior Nasal Swab     Status: None   Collection Time: 03/18/24  3:11 AM   Specimen: Anterior Nasal Swab  Result Value Ref Range  Status   SARS Coronavirus 2 by RT PCR NEGATIVE NEGATIVE Final    Comment: (NOTE) SARS-CoV-2 target nucleic acids are NOT DETECTED.  The SARS-CoV-2 RNA is generally detectable in upper respiratory specimens during the acute phase of infection. The lowest concentration of SARS-CoV-2 viral copies this assay can detect is 138 copies/mL. A negative result does not preclude SARS-Cov-2 infection and should not be used as the sole basis for treatment or other patient management decisions. A negative result may occur with  improper specimen collection/handling, submission of specimen other than nasopharyngeal swab, presence of viral mutation(s) within the areas targeted by this assay, and inadequate number of viral copies(<138 copies/mL). A negative result must be combined with clinical observations, patient history, and epidemiological information. The expected result is Negative.  Fact Sheet for Patients:  BloggerCourse.com  Fact Sheet for Healthcare Providers:  SeriousBroker.it  This test is no t yet approved or cleared by the [place]  FDA and  has been authorized for detection and/or diagnosis of SARS-CoV-2 by FDA under an Emergency Use Authorization (EUA). This EUA will remain  in effect (meaning this test can be used) for the duration of the COVID-19 declaration under Section 564(b)(1) of the Act, 21 U.S.C.section 360bbb-3(b)(1), unless the authorization is terminated  or revoked sooner.       Influenza A by PCR NEGATIVE NEGATIVE Final   Influenza B by PCR NEGATIVE NEGATIVE Final    Comment: (NOTE) The Xpert Xpress SARS-CoV-2/FLU/RSV plus assay is intended as an aid in the diagnosis of influenza from Nasopharyngeal swab specimens and should not be used as a sole basis for treatment. Nasal washings and aspirates are unacceptable for Xpert Xpress SARS-CoV-2/FLU/RSV testing.  Fact Sheet for  Patients: BloggerCourse.com  Fact Sheet for Healthcare Providers: SeriousBroker.it  This test is not yet approved or cleared by the [place]  FDA and has been authorized for detection and/or diagnosis of SARS-CoV-2 by FDA under an Emergency Use Authorization (EUA). This EUA will remain in effect (meaning this test can be used) for the duration of the COVID-19 declaration under Section 564(b)(1) of the Act, 21 U.S.C. section 360bbb-3(b)(1), unless the authorization is terminated or revoked.     Resp Syncytial Virus by PCR NEGATIVE NEGATIVE Final    Comment: (NOTE) Fact Sheet for Patients: BloggerCourse.com  Fact Sheet for Healthcare Providers: SeriousBroker.it  This test is not yet approved or cleared by the [place]  FDA and has been authorized for detection and/or diagnosis of SARS-CoV-2 by FDA under an Emergency Use Authorization (EUA). This EUA  will remain in effect (meaning this test can be used) for the duration of the COVID-19 declaration under Section 564(b)(1) of the Act, 21 U.S.C. section 360bbb-3(b)(1), unless the authorization is terminated or revoked.  Performed at [facility], [address] [place], [place] [address]   Respiratory (~20 pathogens) panel by PCR     Status: None   Collection Time: 03/18/24  8:52 AM   Specimen: Nasopharyngeal Swab; Respiratory  Result Value Ref Range Status   Adenovirus NOT DETECTED NOT DETECTED Final   Coronavirus 229E NOT DETECTED NOT DETECTED Final    Comment: (NOTE) The Coronavirus on the Respiratory Panel, DOES NOT test for the novel  Coronavirus (2019 nCoV)    Coronavirus HKU1 NOT DETECTED NOT DETECTED Final   Coronavirus NL63 NOT DETECTED NOT DETECTED Final   Coronavirus OC43 NOT DETECTED NOT DETECTED Final   Metapneumovirus NOT DETECTED NOT DETECTED Final   Rhinovirus / Enterovirus NOT DETECTED NOT  DETECTED Final   Influenza A NOT DETECTED NOT DETECTED Final   Influenza B NOT DETECTED NOT DETECTED Final   Parainfluenza Virus 1 NOT DETECTED NOT DETECTED Final   Parainfluenza Virus 2 NOT DETECTED NOT DETECTED Final   Parainfluenza Virus 3 NOT DETECTED NOT DETECTED Final   Parainfluenza Virus 4 NOT DETECTED NOT DETECTED Final   Respiratory Syncytial Virus NOT DETECTED NOT DETECTED Final   Bordetella pertussis NOT DETECTED NOT DETECTED Final   Bordetella Parapertussis NOT DETECTED NOT DETECTED Final   Chlamydophila pneumoniae NOT DETECTED NOT DETECTED Final   Mycoplasma pneumoniae NOT DETECTED NOT DETECTED Final    Comment: Performed at [facility], [address] [place], [place] [address]    Procedures and diagnostic studies:  US  Venous Img Lower Bilateral (DVT) Result Date: 03/19/2024 CLINICAL DATA:  Bilateral lower extremity pain. EXAM: BILATERAL LOWER EXTREMITY VENOUS DOPPLER ULTRASOUND TECHNIQUE: Gray-scale sonography with graded compression, as well as color Doppler and duplex ultrasound were performed to evaluate the lower extremity deep venous systems from the level of the common femoral vein and including the common femoral, femoral, profunda femoral, popliteal and calf veins including the posterior tibial, peroneal and gastrocnemius veins when visible. The superficial great saphenous vein was also interrogated. Spectral Doppler was utilized to evaluate flow at rest and with distal augmentation maneuvers in the common femoral, femoral and popliteal veins. COMPARISON:  None Available. FINDINGS: RIGHT LOWER EXTREMITY Common Femoral Vein: No evidence of thrombus. Normal compressibility, respiratory phasicity and response to augmentation. Saphenofemoral Junction: No evidence of thrombus. Normal compressibility and flow on color Doppler imaging. Profunda Femoral Vein: No evidence of thrombus. Normal compressibility and flow on color Doppler imaging. Femoral Vein: No evidence of  thrombus. Normal compressibility, respiratory phasicity and response to augmentation. Popliteal Vein: No evidence of thrombus. Normal compressibility, respiratory phasicity and response to augmentation. Calf Veins: No evidence of thrombus. Normal compressibility and flow on color Doppler imaging. Superficial Great Saphenous Vein: No evidence of thrombus. Normal compressibility. Venous Reflux:  None. Other Findings: No evidence of superficial thrombophlebitis or abnormal fluid collection. LEFT LOWER EXTREMITY Common Femoral Vein: No evidence of thrombus. Normal compressibility, respiratory phasicity and response to augmentation. Saphenofemoral Junction: No evidence of thrombus. Normal compressibility and flow on color Doppler imaging. Profunda Femoral Vein: No evidence of thrombus. Normal compressibility and flow on color Doppler imaging. Femoral Vein: No evidence of thrombus. Normal compressibility, respiratory phasicity and response to augmentation. Popliteal Vein: No evidence of thrombus. Normal compressibility, respiratory phasicity and response to augmentation. Calf Veins: No  evidence of thrombus. Normal compressibility and flow on color Doppler imaging. Superficial Great Saphenous Vein: No evidence of thrombus. Normal compressibility. Venous Reflux:  None. Other Findings: No evidence of superficial thrombophlebitis or abnormal fluid collection. IMPRESSION: No evidence of deep venous thrombosis in either lower extremity. Electronically Signed   By: Erica Hau M.D.   On: 03/19/2024 19:52               LOS: 2 days   Moana Munford  [facility]   Pager on [email or web address]. If 7PM-7AM, please contact night-coverage at [email or web address]     03/20/2024, 12:21 PM

## 2024-03-20 NOTE — Plan of Care (Signed)
  Problem: Clinical Measurements: Goal: Diagnostic test results will improve Outcome: Progressing   Problem: Activity: Goal: Risk for activity intolerance will decrease Outcome: Progressing   Problem: Nutrition: Goal: Adequate nutrition will be maintained Outcome: Progressing   Problem: Coping: Goal: Level of anxiety will decrease Outcome: Progressing   Problem: Elimination: Goal: Will not experience complications related to bowel motility Outcome: Progressing Goal: Will not experience complications related to urinary retention Outcome: Progressing   Problem: Pain Managment: Goal: General experience of comfort will improve and/or be controlled Outcome: Progressing   Problem: Safety: Goal: Ability to remain free from injury will improve Outcome: Progressing

## 2024-03-21 DIAGNOSIS — N10 Acute tubulo-interstitial nephritis: Secondary | ICD-10-CM | POA: Diagnosis not present

## 2024-03-21 LAB — BASIC METABOLIC PANEL WITH GFR
Anion gap: 11 (ref 5–15)
BUN: 25 mg/dL — ABNORMAL HIGH (ref 8–23)
CO2: 23 mmol/L (ref 22–32)
Calcium: 8.1 mg/dL — ABNORMAL LOW (ref 8.9–10.3)
Chloride: 100 mmol/L (ref 98–111)
Creatinine, Ser: 1.55 mg/dL — ABNORMAL HIGH (ref 0.44–1.00)
GFR, Estimated: 36 mL/min — ABNORMAL LOW (ref >60)
Glucose, Bld: 79 mg/dL (ref 70–99)
Potassium: 3.5 mmol/L (ref 3.5–5.1)
Sodium: 134 mmol/L — ABNORMAL LOW (ref 135–145)

## 2024-03-21 LAB — CBC
HCT: 26 % — ABNORMAL LOW (ref 36.0–46.0)
Hemoglobin: 8.2 g/dL — ABNORMAL LOW (ref 12.0–15.0)
MCH: 25.2 pg — ABNORMAL LOW (ref 26.0–34.0)
MCHC: 31.5 g/dL (ref 30.0–36.0)
MCV: 79.8 fL — ABNORMAL LOW (ref 80.0–100.0)
Platelets: 225 10*3/uL (ref 150–400)
RBC: 3.26 MIL/uL — ABNORMAL LOW (ref 3.87–5.11)
RDW: 16.5 % — ABNORMAL HIGH (ref 11.5–15.5)
WBC: 7.5 10*3/uL (ref 4.0–10.5)
nRBC: 0 % (ref 0.0–0.2)

## 2024-03-21 MED ORDER — AMOXICILLIN 500 MG PO CAPS
1000.0000 mg | ORAL_CAPSULE | Freq: Three times a day (TID) | ORAL | Status: DC
  Administered 2024-03-21: 1000 mg via ORAL
  Filled 2024-03-21 (×2): qty 2

## 2024-03-21 MED ORDER — SODIUM CHLORIDE 0.9 % IV BOLUS
500.0000 mL | Freq: Once | INTRAVENOUS | Status: AC
  Administered 2024-03-21: 500 mL via INTRAVENOUS

## 2024-03-21 MED ORDER — ENOXAPARIN SODIUM 40 MG/0.4ML IJ SOSY: 40.0000 mg | PREFILLED_SYRINGE | INTRAMUSCULAR | Status: DC

## 2024-03-21 MED ORDER — LEVOFLOXACIN 750 MG PO TABS: 750.0000 mg | ORAL_TABLET | ORAL | 0 refills | Status: AC

## 2024-03-21 NOTE — Progress Notes (Signed)
 Anticoagulation monitoring(Lovenox):  67yo  F ordered Lovenox 30 mg Q24h    Filed Weights   03/17/24 1407  Weight: 64 kg (141 lb 1.5 oz)   BMI 22   Lab Results  Component Value Date   CREATININE 1.55 (H) 03/21/2024   CREATININE 1.48 (H) 03/20/2024   CREATININE 1.45 (H) 03/19/2024   Estimated Creatinine Clearance: 34.2 mL/min (A) (by C-G formula based on SCr of 1.55 mg/dL (H)). Hemoglobin & Hematocrit     Component Value Date/Time   HGB 8.2 (L) 03/21/2024 0414   HGB 7.0 (L) 03/31/2023 1332   HGB 8.8 (L) 05/11/2017 1443   HCT 26.0 (L) 03/21/2024 0414   HCT 22.5 (L) 07/28/2017 2242     Per Protocol for Patient with estCrcl now > 30 ml/min and BMI < 40, will transition to Lovenox 40 mg Q24h       Thomasine Flick PharmD Clinical Pharmacist 03/21/2024

## 2024-03-21 NOTE — Plan of Care (Signed)

## 2024-03-21 NOTE — Discharge Summary (Signed)
 Physician Discharge Summary   Patient: Traci Duran MRN: [id number] DOB: Feb 23, 1956  Admit date:     03/17/2024  Discharge date: 03/21/24  Discharge Physician: Sheril Dines   PCP: [facility]   Recommendations at discharge:   Follow-up with PCP in 1 week  Discharge Diagnoses: Principal Problem:   Acute pyelonephritis Active Problems:   E coli bacteremia   Chronic obstructive pulmonary disease (HCC)   PAD (peripheral artery disease) (HCC)   Coronary artery disease, non-occlusive   Essential hypertension - check pressures on R arm   Chronic heart failure with preserved ejection fraction (HFpEF) (HCC)   History of GI bleed   Urinary incontinence   Falls   Fall   CKD stage 3b, GFR 30-44 ml/min (HCC)  Resolved Problems:   * No resolved hospital problems. *  Hospital Course:  Traci Duran is a 68 y.o. 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 Inspite of this, the only fall that she recalls (besides her HPI falls) is about 3-4 months ago.   For the last couple of weeks, patient described cough and "congestion". Some expectoration of mucous.  no chest pain, no fever. Reports some   sensatin of sob with ambulation. She also described fear of passing out - althgouth this has never actually happened - over the same period of time. She also says that she is incontinent of urine over the same period of time. Last couple of days she has had 3 episodes of her legs giving out while ambulating causing her to fall directly downwards, no truama. Not felt to be due to new weaknes, presycnope or vertigo by patient. She has been nasueas for last 2 days. But no votming, abd pain or diahrea. Patient initiay said she has some pain with mictuition, then denies same.   Patient lives with room mate and is evaluated in ER for frequent falls. Per report - negative orthostatics and patient was markedly unsteady during ambulation  trial. Medical eval is sought.    Assessment and Plan:   # Fever, acute pyelonephritis, E. coli bacteremia Fever has improved Urine urine and blood culture showed E. coli.  S/p treatment with IV ceftriaxone for 3 days. She will be discharged on Levaquin every 48 hours for 4 doses. CT scan showed uncomplicated left pyelonephritis History of Klebsiella UTI in July 2024 and November 2023     # Ambulatory dysfunction With low back pain. X-rays of lumbar and thoracic spine show old compression fractures of thoracic spine, no tenderness there, does have some diffuse tenderness around low back PT and OT recommended home health therapy.  She says she does not want to go to rehab even if it was recommended..     # CAD # Tropinemia Initial troponin elevated, no chest pain. Suspect demand - home atorvastatin     # CKD 3b Kidney function is at baseline     # HFpEF Euvolemic BP is stable. Continue Lasix   #Orthostatic hypotension Note from PT as follows: "BP assessed in LUE in standing: 87/67 (MAP 75), and immediately upon sitting 78/56 (65). Two more readings taken where BP remained low despite AROM of feet/knees and semi reclined in recliner. LUE reading stated 113/68, and RUE 92/60 at end of session".    Patient was given normal saline 500 mL bolus.  Repeat vital signs are as follows: BP 108/64, sitting BP 91/67, standing BP 105/59 and standing for a few minutes  BP 125/66.  Lisinopril will be held at discharge.    # Iron deficiency anemia # History GI bleed Followed by gi and onc as outpt, saw onc earlier this week, possible repeat iron transfusion planned.  H&H is stable     # COPD No apparent exacerbation, nothing acute on cxr, no hypoxia or dyspnea Continue bronchodilators     Chronic low back pain, left lower extremity pain: Venous duplex negative for DVT of lower extremities.  Left lower extremity pain is likely pain from sciatica since pain radiates from lower  back to left posterior thigh and leg.   Analgesics as needed for pain.     Comorbidities include: General anxiety disorder, chronic pain on duloxetine and gabapentin, hypertension,    Her condition has improved and she is deemed stable for discharge to home today.      Consultants: None Procedures performed: None Disposition: Home health Diet recommendation:  Discharge Diet Orders (From admission, onward)     Start     Ordered   03/21/24 0000  Diet - low sodium heart healthy        03/21/24 1408           Cardiac diet DISCHARGE MEDICATION: Allergies as of 03/21/2024       Reactions   Chantix [varenicline] Hives, Shortness Of Breath, Palpitations   Diphenhydramine Hcl Shortness Of Breath   Benadryl [diphenhydramine Hcl] Rash   Niacin Rash   Trazodone Palpitations        Medication List     STOP taking these medications    lisinopril 10 MG tablet Commonly known as: ZESTRIL       TAKE these medications    albuterol 108 (90 Base) MCG/ACT inhaler Commonly known as: VENTOLIN HFA Inhale 2 puffs into the lungs every 4 (four) hours as needed for wheezing or shortness of breath.   albuterol (2.5 MG/3ML) 0.083% nebulizer solution Commonly known as: PROVENTIL Take 3 mLs (2.5 mg total) by nebulization every 6 (six) hours as needed for wheezing or shortness of breath.   alendronate 70 MG tablet Commonly known as: FOSAMAX Take 70 mg by mouth once a week.   alprazolam 2 MG tablet Commonly known as: XANAX Take 2 mg by mouth 2 (two) times daily.   atorvastatin 80 MG tablet Commonly known as: LIPITOR Take 80 mg by mouth daily.   budesonide-formoterol 160-4.5 MCG/ACT inhaler Commonly known as: SYMBICORT Inhale 2 puffs into the lungs 2 (two) times daily. What changed: Another medication with the same name was removed. Continue taking this medication, and follow the directions you see here.   busPIRone 10 MG tablet Commonly known as: BUSPAR Take 10 mg by  mouth 2 (two) times daily.   carvedilol 6.25 MG tablet Commonly known as: COREG Take 6.25 mg by mouth 2 (two) times daily.   citalopram 40 MG tablet Commonly known as: CELEXA Take 40 mg by mouth daily.   DULoxetine 60 MG capsule Commonly known as: CYMBALTA Take 60 mg by mouth daily.   furosemide 40 MG tablet Commonly known as: LASIX Take 40 mg by mouth every morning.   gabapentin 400 MG capsule Commonly known as: NEURONTIN Take 400 mg by mouth 3 (three) times daily.   levofloxacin 750 MG tablet Commonly known as: Levaquin Take 1 tablet (750 mg total) by mouth every other day.   nitroGLYCERIN 0.4 MG SL tablet Commonly known as: NITROSTAT Place 0.4 mg under the tongue every 5 (five) minutes as needed for chest pain.  Durable Medical Equipment  (From admission, onward)           Start     Ordered   03/21/24 0000  For home use only DME 3 n 1        03/21/24 1204            Follow-up Information     [facility] Follow up.   Why: They will follow up with you for your home health therapy needs. Contact information: [address] [place] [address] [phone]                Discharge Exam: Ceasar Mons Weights   03/17/24 1407  Weight: 64 kg   GEN: NAD SKIN: Warm and dry EYES: No pallor or icterus ENT: MMM CV: RRR PULM: CTA B ABD: soft, ND, NT, +BS CNS: AAO x 3, non focal EXT: No edema or tenderness   Condition at discharge: good  The results of significant diagnostics from this hospitalization (including imaging, microbiology, ancillary and laboratory) are listed below for reference.   Imaging Studies: US Venous Img Lower Bilateral (DVT) Result Date: 03/19/2024 CLINICAL DATA:  Bilateral lower extremity pain. EXAM: BILATERAL LOWER EXTREMITY VENOUS DOPPLER ULTRASOUND TECHNIQUE: Gray-scale sonography with graded compression, as well as color Doppler and duplex ultrasound were performed to evaluate the  lower extremity deep venous systems from the level of the common femoral vein and including the common femoral, femoral, profunda femoral, popliteal and calf veins including the posterior tibial, peroneal and gastrocnemius veins when visible. The superficial great saphenous vein was also interrogated. Spectral Doppler was utilized to evaluate flow at rest and with distal augmentation maneuvers in the common femoral, femoral and popliteal veins. COMPARISON:  None Available. FINDINGS: RIGHT LOWER EXTREMITY Common Femoral Vein: No evidence of thrombus. Normal compressibility, respiratory phasicity and response to augmentation. Saphenofemoral Junction: No evidence of thrombus. Normal compressibility and flow on color Doppler imaging. Profunda Femoral Vein: No evidence of thrombus. Normal compressibility and flow on color Doppler imaging. Femoral Vein: No evidence of thrombus. Normal compressibility, respiratory phasicity and response to augmentation. Popliteal Vein: No evidence of thrombus. Normal compressibility, respiratory phasicity and response to augmentation. Calf Veins: No evidence of thrombus. Normal compressibility and flow on color Doppler imaging. Superficial Great Saphenous Vein: No evidence of thrombus. Normal compressibility. Venous Reflux:  None. Other Findings: No evidence of superficial thrombophlebitis or abnormal fluid collection. LEFT LOWER EXTREMITY Common Femoral Vein: No evidence of thrombus. Normal compressibility, respiratory phasicity and response to augmentation. Saphenofemoral Junction: No evidence of thrombus. Normal compressibility and flow on color Doppler imaging. Profunda Femoral Vein: No evidence of thrombus. Normal compressibility and flow on color Doppler imaging. Femoral Vein: No evidence of thrombus. Normal compressibility, respiratory phasicity and response to augmentation. Popliteal Vein: No evidence of thrombus. Normal compressibility, respiratory phasicity and response to  augmentation. Calf Veins: No evidence of thrombus. Normal compressibility and flow on color Doppler imaging. Superficial Great Saphenous Vein: No evidence of thrombus. Normal compressibility. Venous Reflux:  None. Other Findings: No evidence of superficial thrombophlebitis or abnormal fluid collection. IMPRESSION: No evidence of deep venous thrombosis in either lower extremity. Electronically Signed   By: Irish Lack M.D.   On: 03/19/2024 19:52   CT ABDOMEN PELVIS W CONTRAST Result Date: 03/18/2024 CLINICAL DATA:  Left lower quadrant abdominal pain. Left flank pain with dysuria. Fever. Three falls in the past 2 days. Hypotension. Smoker. EXAM: CT ABDOMEN AND PELVIS WITH CONTRAST TECHNIQUE: Multidetector CT imaging of the abdomen  and pelvis was performed using the standard protocol following bolus administration of intravenous contrast. RADIATION DOSE REDUCTION: This exam was performed according to the departmental dose-optimization program which includes automated exposure control, adjustment of the mA and/or kV according to patient size and/or use of iterative reconstruction technique. CONTRAST:  75mL OMNIPAQUE IOHEXOL 300 MG/ML  SOLN COMPARISON:  03/17/2024 FINDINGS: Lower chest: Enlarged heart. Atheromatous calcifications, including the coronary arteries and aorta. Minimal bilateral dependent atelectasis. Small to moderate-sized hiatal hernia. Hepatobiliary: No focal liver abnormality is seen. No gallstones, gallbladder wall thickening, or biliary dilatation. Pancreas: Unremarkable. No pancreatic ductal dilatation or surrounding inflammatory changes. Spleen: Multiple calcified granulomata.  Normal in size and shape. Adrenals/Urinary Tract: Stable marked right renal atrophy and mild compensatory hypertrophy of the left kidney. Small simple appearing right renal cyst. This does not need imaging follow-up. Multiple patchy and geographical areas of low density in the left renal parenchyma. Mild left  perinephric soft tissue stranding on the left is slightly less prominent and stable minimal right perinephric soft tissue stranding. Unremarkable ureters and urinary bladder. No urinary tract calculi or hydronephrosis seen. Stomach/Bowel: Small to moderate-sized hiatal hernia. Multiple proximal sigmoid colon diverticula without evidence of diverticulitis. Surgically absent appendix. Unremarkable small bowel. Vascular/Lymphatic: Marked atheromatous arterial calcifications without aneurysm, including the origins of both renal arteries and more peripheral portions of both renal arteries. No enlarged lymph nodes. Reproductive: Uterus and bilateral adnexa are unremarkable. Other: No abdominal wall hernia or abnormality. No abdominopelvic ascites. Musculoskeletal: Proximal left femur fixation hardware. Mild lumbar and lower thoracic spine degenerative changes. Mild, chronic lower thoracic spine compression deformities without significant change. IMPRESSION: 1. Left pyelonephritis without abscess. 2. Stable marked right renal atrophy and mild compensatory hypertrophy of the left kidney. 3. Small to moderate-sized hiatal hernia. 4. Sigmoid diverticulosis. 5. Cardiomegaly. 6. Marked atherosclerotic calcifications, including the coronary arteries, renal arteries and aorta. Electronically Signed   By: Catherin Closs M.D.   On: 03/18/2024 11:22   DG Abd 2 Views Result Date: 03/18/2024 CLINICAL DATA:  Vomiting back pain EXAM: ABDOMEN - 2 VIEW COMPARISON:  09/03/2018 FINDINGS: Few prominent gas-filled loops central small bowel with scattered colon gas and mild stool in the colon. Partially visualized left femoral hardware. Extensive vascular calcifications. No radiopaque calculi. IMPRESSION: Few prominent gas-filled loops of central small bowel with scattered colon gas and mild stool in the colon. Findings are nonspecific, question mild ileus. Electronically Signed   By: Esmeralda Hedge M.D.   On: 03/18/2024 00:17   DG Thoracic  Spine 2 View Result Date: 03/18/2024 CLINICAL DATA:  Multiple fall with back pain EXAM: THORACIC SPINE 2 VIEWS COMPARISON:  CT 09/30/2017 FINDINGS: Thoracic alignment is within normal limits. Severe chronic compression fracture at T4. Mild chronic compression deformity at T9. Chronic mild superior endplate deformity at T12. Mild degenerative changes. IMPRESSION: Chronic compression fractures at T4, T9, and T12. Electronically Signed   By: Esmeralda Hedge M.D.   On: 03/18/2024 00:16   DG Lumbar Spine 2-3 Views Result Date: 03/18/2024 CLINICAL DATA:  Vomiting back pain EXAM: LUMBAR SPINE - 2-3 VIEW COMPARISON:  CT 10/09/2022 FINDINGS: Five lumbar type vertebra. Vertebral body heights are maintained. Mild disc space narrowing at L4-L5. Dense aortic atherosclerosis. IMPRESSION: Mild degenerative change at L4-L5. Electronically Signed   By: Esmeralda Hedge M.D.   On: 03/18/2024 00:14   DG Chest Port 1 View Result Date: 03/18/2024 CLINICAL DATA:  Multiple fall EXAM: PORTABLE CHEST 1 VIEW COMPARISON:  03/26/2023, 10/09/2022 FINDINGS: Borderline to mild  cardiomegaly. Diffuse bronchitic changes which appear chronic. No acute airspace disease, pleural effusion or pneumothorax. Chronic fracture deformity of the proximal left humerus. IMPRESSION: No active disease. Chronic bronchitic changes. Electronically Signed   By: Jasmine Pang M.D.   On: 03/18/2024 00:13   CT Renal Stone Study Result Date: 03/17/2024 CLINICAL DATA:  Left flank pain and dysuria. EXAM: CT ABDOMEN AND PELVIS WITHOUT CONTRAST TECHNIQUE: Multidetector CT imaging of the abdomen and pelvis was performed following the standard protocol without IV contrast. RADIATION DOSE REDUCTION: This exam was performed according to the departmental dose-optimization program which includes automated exposure control, adjustment of the mA and/or kV according to patient size and/or use of iterative reconstruction technique. COMPARISON:  October 09, 2022 FINDINGS: Lower  chest: No acute abnormality. Hepatobiliary: No focal liver abnormality is seen. No gallstones, gallbladder wall thickening, or biliary dilatation. Pancreas: Unremarkable. No pancreatic ductal dilatation or surrounding inflammatory changes. Spleen: Numerous punctate parenchymal calcifications are seen scattered throughout the parenchyma of an otherwise normal-appearing spleen. Adrenals/Urinary Tract: Adrenal glands are unremarkable. The right kidney is atrophic in appearance. The left kidney is normal in size, without renal calculi, focal lesion, or hydronephrosis. The urinary bladder is poorly distended and subsequently limited in evaluation. Mild to moderate severity, predominantly anterior urinary bladder wall thickening is noted. Stomach/Bowel: There is a small hiatal hernia. The appendix is surgically absent. No evidence of bowel wall thickening, distention, or inflammatory changes. Numerous noninflamed diverticula are seen throughout the descending and sigmoid colon. Vascular/Lymphatic: Aortic atherosclerosis. No enlarged abdominal or pelvic lymph nodes. Reproductive: Uterus and bilateral adnexa are unremarkable. Other: No abdominal wall hernia or abnormality. No abdominopelvic ascites. Musculoskeletal: A metallic density intramedullary rod and compression screw device are seen within the proximal left femur. There is associated streak artifact with subsequently limited evaluation of the adjacent osseous and soft tissue structures. Multilevel degenerative changes are seen involving predominantly the lower thoracic spine. IMPRESSION: 1. Anterior urinary bladder wall thickening which may be secondary to underdistention. Correlation with urinalysis is recommended to exclude the presence of an underlying infectious or inflammatory process. 2. Atrophic right kidney. 3. Colonic diverticulosis. 4. Small hiatal hernia. 5. Evidence of prior appendectomy. 6. Aortic atherosclerosis. Electronically Signed   By: Aram Candela M.D.   On: 03/17/2024 21:45    Microbiology: Results for orders placed or performed during the hospital encounter of 03/17/24  Urine Culture (for pregnant, neutropenic or urologic patients or patients with an indwelling urinary catheter)     Status: Abnormal   Collection Time: 03/17/24  5:42 PM   Specimen: Urine, Random  Result Value Ref Range Status   Specimen Description   Final    URINE, RANDOM Performed at [facility], [address], [place], [place] [address]    Special Requests   Final    NONE Performed at [facility], [address] [place], [place] [address]    Culture >=100,000 COLONIES/mL ESCHERICHIA COLI (A)  Final   Report Status 03/20/2024 FINAL  Final   Organism ID, Bacteria ESCHERICHIA COLI (A)  Final      Susceptibility   Escherichia coli - MIC*    AMPICILLIN <=2 SENSITIVE Sensitive     CEFAZOLIN <=4 SENSITIVE Sensitive     CEFEPIME <=0.12 SENSITIVE Sensitive     CEFTRIAXONE <=0.25 SENSITIVE Sensitive     CIPROFLOXACIN <=0.25 SENSITIVE Sensitive     GENTAMICIN <=1 SENSITIVE Sensitive     IMIPENEM <=0.25 SENSITIVE Sensitive     NITROFURANTOIN <=16 SENSITIVE Sensitive  TRIMETH/SULFA <=20 SENSITIVE Sensitive     AMPICILLIN/SULBACTAM <=2 SENSITIVE Sensitive     PIP/TAZO <=4 SENSITIVE Sensitive ug/mL    * >=100,000 COLONIES/mL ESCHERICHIA COLI  Culture, blood (Routine X 2) w Reflex to ID Panel     Status: Abnormal (Preliminary result)   Collection Time: 03/18/24 12:00 AM   Specimen: BLOOD  Result Value Ref Range Status   Specimen Description   Final    BLOOD RIGHT ANTECUBITAL Performed at [facility], [address], [place], [place] [address]    Special Requests   Final    BOTTLES DRAWN AEROBIC AND ANAEROBIC Blood Culture results may not be optimal due to an inadequate volume of blood received in culture bottles Performed at [facility], [address] [place], [place] [address]    Culture  Setup Time    Final    GRAM NEGATIVE RODS AEROBIC BOTTLE ONLY CRITICAL RESULT CALLED TO, READ BACK BY AND VERIFIED WITH: RAQUEL RODRIGUEZ GUZMAN ON 03/19/24 AT 1746 QSD    Culture (A)  Final    ESCHERICHIA COLI SUSCEPTIBILITIES TO FOLLOW Performed at [facility], [address] [place], [place] [address]    Report Status PENDING  Incomplete  Blood Culture ID Panel (Reflexed)     Status: Abnormal   Collection Time: 03/18/24 12:00 AM  Result Value Ref Range Status   Enterococcus faecalis NOT DETECTED NOT DETECTED Final   Enterococcus Faecium NOT DETECTED NOT DETECTED Final   Listeria monocytogenes NOT DETECTED NOT DETECTED Final   Staphylococcus species NOT DETECTED NOT DETECTED Final   Staphylococcus aureus (BCID) NOT DETECTED NOT DETECTED Final   Staphylococcus epidermidis NOT DETECTED NOT DETECTED Final   Staphylococcus lugdunensis NOT DETECTED NOT DETECTED Final   Streptococcus species NOT DETECTED NOT DETECTED Final   Streptococcus agalactiae NOT DETECTED NOT DETECTED Final   Streptococcus pneumoniae NOT DETECTED NOT DETECTED Final   Streptococcus pyogenes NOT DETECTED NOT DETECTED Final   A.calcoaceticus-baumannii NOT DETECTED NOT DETECTED Final   Bacteroides fragilis NOT DETECTED NOT DETECTED Final   Enterobacterales DETECTED (A) NOT DETECTED Final    Comment: Enterobacterales represent a large order of gram negative bacteria, not a single organism. CRITICAL RESULT CALLED TO, READ BACK BY AND VERIFIED WITH: RAQUEL RODRIGUEZ GUZMAN ON 03/19/24 AT 1746 QSD    Enterobacter cloacae complex NOT DETECTED NOT DETECTED Final   Escherichia coli DETECTED (A) NOT DETECTED Final    Comment: CRITICAL RESULT CALLED TO, READ BACK BY AND VERIFIED WITH: RAQUEL RODRIGUEZ GUZMAN ON 03/19/24 AT 1746 QSD    Klebsiella aerogenes NOT DETECTED NOT DETECTED Final   Klebsiella oxytoca NOT DETECTED NOT DETECTED Final   Klebsiella pneumoniae NOT DETECTED NOT DETECTED Final   Proteus species NOT DETECTED NOT  DETECTED Final   Salmonella species NOT DETECTED NOT DETECTED Final   Serratia marcescens NOT DETECTED NOT DETECTED Final   Haemophilus influenzae NOT DETECTED NOT DETECTED Final   Neisseria meningitidis NOT DETECTED NOT DETECTED Final   Pseudomonas aeruginosa NOT DETECTED NOT DETECTED Final   Stenotrophomonas maltophilia NOT DETECTED NOT DETECTED Final   Candida albicans NOT DETECTED NOT DETECTED Final   Candida auris NOT DETECTED NOT DETECTED Final   Candida glabrata NOT DETECTED NOT DETECTED Final   Candida krusei NOT DETECTED NOT DETECTED Final   Candida parapsilosis NOT DETECTED NOT DETECTED Final   Candida tropicalis NOT DETECTED NOT DETECTED Final   Cryptococcus neoformans/gattii NOT DETECTED NOT DETECTED Final   CTX-M ESBL NOT DETECTED NOT DETECTED  Final   Carbapenem resistance IMP NOT DETECTED NOT DETECTED Final   Carbapenem resistance KPC NOT DETECTED NOT DETECTED Final   Carbapenem resistance NDM NOT DETECTED NOT DETECTED Final   Carbapenem resist OXA 48 LIKE NOT DETECTED NOT DETECTED Final   Carbapenem resistance VIM NOT DETECTED NOT DETECTED Final    Comment: Performed at [facility], [address] [place], [place] [address]  Culture, blood (Routine X 2) w Reflex to ID Panel     Status: None (Preliminary result)   Collection Time: 03/18/24 12:05 AM   Specimen: BLOOD  Result Value Ref Range Status   Specimen Description BLOOD LEFT ANTECUBITAL  Final   Special Requests   Final    BOTTLES DRAWN AEROBIC AND ANAEROBIC Blood Culture results may not be optimal due to an inadequate volume of blood received in culture bottles   Culture   Final    NO GROWTH 3 DAYS Performed at [facility], [address], [place], [place] [address]    Report Status PENDING  Incomplete  Resp panel by RT-PCR (RSV, Flu A&B, Covid) Anterior Nasal Swab     Status: None   Collection Time: 03/18/24  3:11 AM   Specimen: Anterior Nasal Swab  Result Value Ref Range Status   SARS  Coronavirus 2 by RT PCR NEGATIVE NEGATIVE Final    Comment: (NOTE) SARS-CoV-2 target nucleic acids are NOT DETECTED.  The SARS-CoV-2 RNA is generally detectable in upper respiratory specimens during the acute phase of infection. The lowest concentration of SARS-CoV-2 viral copies this assay can detect is 138 copies/mL. A negative result does not preclude SARS-Cov-2 infection and should not be used as the sole basis for treatment or other patient management decisions. A negative result may occur with  improper specimen collection/handling, submission of specimen other than nasopharyngeal swab, presence of viral mutation(s) within the areas targeted by this assay, and inadequate number of viral copies(<138 copies/mL). A negative result must be combined with clinical observations, patient history, and epidemiological information. The expected result is Negative.  Fact Sheet for Patients:  BloggerCourse.com  Fact Sheet for Healthcare Providers:  SeriousBroker.it  This test is no t yet approved or cleared by the [place]  FDA and  has been authorized for detection and/or diagnosis of SARS-CoV-2 by FDA under an Emergency Use Authorization (EUA). This EUA will remain  in effect (meaning this test can be used) for the duration of the COVID-19 declaration under Section 564(b)(1) of the Act, 21 U.S.C.section 360bbb-3(b)(1), unless the authorization is terminated  or revoked sooner.       Influenza A by PCR NEGATIVE NEGATIVE Final   Influenza B by PCR NEGATIVE NEGATIVE Final    Comment: (NOTE) The Xpert Xpress SARS-CoV-2/FLU/RSV plus assay is intended as an aid in the diagnosis of influenza from Nasopharyngeal swab specimens and should not be used as a sole basis for treatment. Nasal washings and aspirates are unacceptable for Xpert Xpress SARS-CoV-2/FLU/RSV testing.  Fact Sheet for  Patients: BloggerCourse.com  Fact Sheet for Healthcare Providers: SeriousBroker.it  This test is not yet approved or cleared by the [place]  FDA and has been authorized for detection and/or diagnosis of SARS-CoV-2 by FDA under an Emergency Use Authorization (EUA). This EUA will remain in effect (meaning this test can be used) for the duration of the COVID-19 declaration under Section 564(b)(1) of the Act, 21 U.S.C. section 360bbb-3(b)(1), unless the authorization is terminated or revoked.     Resp Syncytial Virus by PCR NEGATIVE NEGATIVE  Final    Comment: (NOTE) Fact Sheet for Patients: BloggerCourse.com  Fact Sheet for Healthcare Providers: SeriousBroker.it  This test is not yet approved or cleared by the [place]  FDA and has been authorized for detection and/or diagnosis of SARS-CoV-2 by FDA under an Emergency Use Authorization (EUA). This EUA will remain in effect (meaning this test can be used) for the duration of the COVID-19 declaration under Section 564(b)(1) of the Act, 21 U.S.C. section 360bbb-3(b)(1), unless the authorization is terminated or revoked.  Performed at [facility], [address] [place], [place] [address]   Respiratory (~20 pathogens) panel by PCR     Status: None   Collection Time: 03/18/24  8:52 AM   Specimen: Nasopharyngeal Swab; Respiratory  Result Value Ref Range Status   Adenovirus NOT DETECTED NOT DETECTED Final   Coronavirus 229E NOT DETECTED NOT DETECTED Final    Comment: (NOTE) The Coronavirus on the Respiratory Panel, DOES NOT test for the novel  Coronavirus (2019 nCoV)    Coronavirus HKU1 NOT DETECTED NOT DETECTED Final   Coronavirus NL63 NOT DETECTED NOT DETECTED Final   Coronavirus OC43 NOT DETECTED NOT DETECTED Final   Metapneumovirus NOT DETECTED NOT DETECTED Final   Rhinovirus / Enterovirus NOT DETECTED NOT  DETECTED Final   Influenza A NOT DETECTED NOT DETECTED Final   Influenza B NOT DETECTED NOT DETECTED Final   Parainfluenza Virus 1 NOT DETECTED NOT DETECTED Final   Parainfluenza Virus 2 NOT DETECTED NOT DETECTED Final   Parainfluenza Virus 3 NOT DETECTED NOT DETECTED Final   Parainfluenza Virus 4 NOT DETECTED NOT DETECTED Final   Respiratory Syncytial Virus NOT DETECTED NOT DETECTED Final   Bordetella pertussis NOT DETECTED NOT DETECTED Final   Bordetella Parapertussis NOT DETECTED NOT DETECTED Final   Chlamydophila pneumoniae NOT DETECTED NOT DETECTED Final   Mycoplasma pneumoniae NOT DETECTED NOT DETECTED Final    Comment: Performed at [facility], [address] [place], [place] [address]    Labs: CBC: Recent Labs  Lab 03/17/24 1410 03/18/24 0311 03/19/24 0502 03/20/24 0550 03/21/24 0414  WBC 14.5* 11.2* 6.5 6.7 7.5  HGB 8.0* 8.1* 7.8* 8.1* 8.2*  HCT 25.3* 25.7* 25.0* 25.7* 26.0*  MCV 81.4 82.6 81.4 80.8 79.8*  PLT 218 190 184 192 225   Basic Metabolic Panel: Recent Labs  Lab 03/17/24 1410 03/18/24 0311 03/19/24 0502 03/20/24 0550 03/21/24 0414  NA 130* 131* 133* 131* 134*  K 3.2* 4.0 4.2 3.8 3.5  CL 99 103 105 102 100  CO2 21* 19* 20* 20* 23  GLUCOSE 86 90 82 82 79  BUN 18 21 20 16  25*  CREATININE 1.47* 1.47* 1.45* 1.48* 1.55*  CALCIUM 8.2* 8.0* 8.3* 8.2* 8.1*  PHOS 1.5*  --   --   --   --    Liver Function Tests: Recent Labs  Lab 03/17/24 1410  AST 14*  ALT 8  ALKPHOS 72  BILITOT 1.0  PROT 6.0*  ALBUMIN 2.8*   CBG: No results for input(s): "GLUCAP" in the last 168 hours.  Discharge time spent: greater than 30 minutes.  Signed: Sheril Dines, MD [facility] 03/21/2024

## 2024-03-21 NOTE — Progress Notes (Signed)
 Occupational Therapy Treatment Patient Details Name: Traci Duran MRN: [id number] DOB: November 02, 1956 Today's Date: 03/21/2024   History of present illness presented to ER secondary to congestion, cough and LEs giving way/falls x3; admitted for management of acute cystitis, recurrent falls   OT comments  Upon entering the room, pt laying across the bed with LEs off the side. Pt is agreeable to OT intervention. Pt performed bed mobility without assistance to EOB. Pt standing with CGA and use of RW to ambulate to bathroom for toileting needs. Pt able to void while on commode but needing assistance to change gown while seated on commode. Pt stands with min A and needs min guard for standing balance while therapist assists pt with donning brief for safety. Pt then transitions to PT session without issue.      If plan is discharge home, recommend the following:  A little help with walking and/or transfers;A little help with bathing/dressing/bathroom;Assistance with cooking/housework;Assist for transportation;Help with stairs or ramp for entrance;Direct supervision/assist for medications management;Direct supervision/assist for financial management   Equipment Recommendations  BSC/3in1       Precautions / Restrictions Precautions Precautions: Fall       Mobility Bed Mobility Overal bed mobility: Modified Independent                  Transfers Overall transfer level: Needs assistance Equipment used: Rolling walker (2 wheels) Transfers: Sit to/from Stand Sit to Stand: Supervision, Contact guard assist                 Balance Overall balance assessment: Needs assistance Sitting-balance support: No upper extremity supported, Feet supported Sitting balance-Leahy Scale: Good     Standing balance support: Reliant on assistive device for balance, During functional activity, Bilateral upper extremity supported Standing balance-Leahy Scale: Fair                              ADL either performed or assessed with clinical judgement   ADL Overall ADL's : Needs assistance/impaired                     Lower Body Dressing: Minimal assistance;Sit to/from stand   Toilet Transfer: Minimal assistance;Rolling walker (2 wheels)   Toileting- Clothing Manipulation and Hygiene: Minimal assistance;Sit to/from stand              Extremity/Trunk Assessment Upper Extremity Assessment Upper Extremity Assessment: Generalized weakness   Lower Extremity Assessment Lower Extremity Assessment: Generalized weakness        Vision Patient Visual Report: No change from baseline           Communication Communication Communication: No apparent difficulties   Cognition Arousal: Alert Behavior During Therapy: WFL for tasks assessed/performed Cognition: No apparent impairments                               Following commands: Intact        Cueing   Cueing Techniques: Verbal cues, Tactile cues  Exercises              Pertinent Vitals/ Pain       Pain Assessment Pain Assessment: No/denies pain         Frequency  Min 2X/week        Progress Toward Goals  OT Goals(current goals can now be found in the care plan section)  Progress towards OT goals:  Progressing toward goals      AM-PAC OT "6 Clicks" Daily Activity     Outcome Measure   Help from another person eating meals?: None Help from another person taking care of personal grooming?: None Help from another person toileting, which includes using toliet, bedpan, or urinal?: A Little Help from another person bathing (including washing, rinsing, drying)?: A Little Help from another person to put on and taking off regular upper body clothing?: None Help from another person to put on and taking off regular lower body clothing?: A Little 6 Click Score: 21    End of Session    OT Visit Diagnosis: Unsteadiness on feet (R26.81);Repeated falls (R29.6);Muscle  weakness (generalized) (M62.81);History of falling (Z91.81)   Activity Tolerance Patient tolerated treatment well   Patient Left in bed;with call bell/phone within reach;with bed alarm set   Nurse Communication Mobility status        Time: 6387-5643 OT Time Calculation (min): 9 min  Charges: OT General Charges $OT Visit: 1 Visit OT Treatments $Self Care/Home Management : 8-22 mins  George Kinder, MS, OTR/L , CBIS ascom [phone]  03/21/24, 12:41 PM

## 2024-03-21 NOTE — TOC Transition Note (Signed)
 Transition of Care [facility]) - Discharge Note   Patient Details  Name: Traci Duran MRN: [id number] Date of Birth: Apr 22, 1956  [facility]) CM/SW Contact:  Odilia Bennett, LCSW Phone Number: 03/21/2024, 2:52 PM   Clinical Narrative:  Patient has orders to discharge home today. CSW left message for [facility] liaison to notify. [employer] liaison confirmed that 3-in-1 has been delivered. No further concerns. CSW signing off.  Final next level of care: Home w Home Health Services Barriers to Discharge: Barriers Resolved   Patient Goals and CMS Choice Patient states their goals for this hospitalization and ongoing recovery are:: Home with home health          Discharge Placement                Patient to be transferred to facility by: Grandson   Patient and family notified of of transfer: 03/21/24  Discharge Plan and Services Additional resources added to the After Visit Summary for       Post Acute Care Choice: Home Health          DME Arranged: 3-N-1 DME Agency: [facility] Date DME Agency Contacted: 03/21/24   Representative spoke with at DME Agency: Sam Creighton HH Arranged: PT, OT [facility] Agency: [facility] Date [facility] Agency Contacted: 03/21/24   Representative spoke with at [facility] Agency: Bartholomew Light  Social Drivers of Health (SDOH) Interventions SDOH Screenings   Food Insecurity: No Food Insecurity (03/18/2024)  Housing: High Risk (03/18/2024)  Transportation Needs: Unmet Transportation Needs (03/18/2024)  Utilities: Not At Risk (03/18/2024)  Financial Resource Strain: Low Risk  (06/03/2022)   [facility] from [facility], [facility]  Physical Activity: Inactive (11/09/2020)   Received from [facility], [facility]  Social Connections: Socially Isolated (03/18/2024)  Stress: Stress Concern Present (11/09/2020)   Received from [facility], [facility]  Tobacco Use: High Risk (03/17/2024)     Readmission Risk Interventions     No data to display

## 2024-03-21 NOTE — Discharge Instructions (Signed)
 Rent/Utility/Housing  Agency Name: [facility] Agency Address: [address] [place], [place] [address] Phone: [phone] Email: [email or web address] Website: [email or web address] Service(s) Offered: Housing services, self-sufficiency, congregate meal program, weatherization program, Field seismologist program, emergency food assistance,  housing counseling, home ownership program, wheels -towork program.  Agency Name: [facility] Address: [address] [place], [place] [address] Phone: [phone] (8a-4p) [phone] (8p- 10p) Email: [email or web address] Website: [email or web address] Service(s) Offered: A program for homeless and/or needy men that includes one-on-one counseling, life skills training and job rehabilitation.  Agency Name: [facility] Address: [address] [place], [place] [address] Phone: [phone] Website: www.[email or web address].org Service(s) Offered: Assistance to needy in emergency with utility bills, heating fuel, and prescriptions. Shelter for homeless 7pm-7am. April 02, 2017 15  Agency Name: [facility] [place] (Developmentally Disabled) Address: [address], [place], [place] [address] Phone: [phone] Contact Person: Cathleen Corti Email: [email or web address] Website: [email or web address] Service(s) Offered: Helps individuals with developmental disabilities move from housing that is more restrictive to homes where they  can achieve greater independence and have more  opportunities.  Agency Name: [facility] Address: [address], [place], [place] [address] Phone: [phone] Email: [email or web address] Website: [email or web address].org Service(s) Offered: Provides affordable housing for low-income families, elderly, and disabled individuals. Offer a wide range of  programs and services, from financial planning to  afterschool and summer programs.  Agency Name: [facility] Address: [address] [place], [place] [address] Phone: [phone] Service(s) Offered: Child support services; child welfare services; food stamps; Medicaid; work first family assistance; and aid with fuel,  rent, food and medicine.  Agency Name: [facility] [place] [employer]. Address: [facility] [address] [place], [place]  [address] Phone: [phone] Website: www.familyabuseservices.org Service(s) Offered: 24 hour Crisis Line: [phone]; 24 hour Emergency Shelter; Transitional Housing; Support Groups; Scientist, physiological; [employer]; Hispanic Outreach: [phone];  Visitation Center: [phone].  Agency Name: [facility] [place]. Address: [address], [place], [place] [address] Phone: [phone] Service(s) Offered: CAP Services; Home and [employer]; Individual or Group Supports; Respite Care Non-Institutional Nursing;  Residential Supports; Respite Care and Personal Care Services; Transportation; Family and Friends Night; Recreational Activities; Three Nutritious Meals/Snacks; Consultation with Registered Dietician; Twenty-four hour Registered Nurse Access; Daily and [employer]; Camp Green Leaves; [place] for the [employer] (During Summer Months) Bingo Night (Every  Wednesday Night); Special Populations Dance Night  (Every Tuesday Night); Professional Hair Care Services.  Agency Name: [facility] Address: [address], [place], [place] [address] Phone: [phone] Contact Person: Jabier Mutton Website: [email or web address].Physicist, medical) Offered: Residential treatment facility for women; food and  clothing, educational & employment development and  transportation to work; Counsellor of financial skills;  parenting and family reunification; emotional and spiritual  support;  transitional housing for program graduates.  Agency Name: [facility] Address: [address], [place], [place] [address] Phone: [phone] Email: [email or web address] Website: TaskTown.es Service(s) Offered: Public housing units for elderly, disabled, and low income people; housing choice vouchers for income eligible  applicants; shelter plus care vouchers; and Psychologist, clinical.  Agency Name: [facility] [employer] Address: [address], [place], [place] [address] Phone: [phone] Email: [email or web address] Website: www.habitatalamance.org Service(s) Offered: Build houses for families in need of decent housing. Each adult in the family must invest 200 hours of labor on  someone else's house, work with volunteers to build their own house, attend classes  on budgeting, home maintenance, yard care, and attend homeowner association meetings.  Agency Name: [facility] Address: [address], [place], [place] [address] Phone: [phone] Website: www.rsli.org Service(s) Offered: Intermediate care facilities for intellectually delayed, Supervised Living in group homes for adults with developmental disabilities, Supervised Living for people who have dual diagnoses (MRMI), Independent Living, Supported Living, respite and a variety of CAP services, pre-vocational services, day supports, and [employer].  Agency Name: [place] [employer] Phone: [phone] Website: www.NCForeclosurePrevention.gov Service(s) Offered: Zero-interest, deferred loans to homeowners struggling to pay their mortgage. Call for more information.   Transportation Resources  Agency Name: [facility] Agency Address: [address] [place], [place] [address] Phone: [phone] Email: [email or web address] Website: www.[email or web address].org Service(s) Offered: Housing services, self-sufficiency,  congregate meal program, weatherization program, Field seismologist program, emergency food assistance,  housing counseling, home ownership program, wheels-towork program.  Agency Name: [facility] [employer] [phone]) Address: [address] [place], [place] [address] Phone: [phone] Website: [email or web address].acta-[email or web address] Service(s) Offered: Transportation for [employer], subscription and demand response; Dial-a-Ride for citizens 56 years of age or older.  Agency Name: Department of Social Services Address: [address] [place], [place] [address] Phone: [phone] Service(s) Offered: Child support services; child welfare services; food stamps; Medicaid; work first family assistance; and aid with fuel,  rent, food and medicine, transportation assistance.  Agency Name: Disabled [employer] (DAV) Transportation  Network Phone: [phone] Service(s) Offered: Transports veterans to the [facility] medical center. Call  forty-eight hours in advance and leave the name, telephone  number, date, and time of appointment. Veteran will be  contacted by the driver the day before the appointment to  arrange a pick up point   Transportation Resources  Agency Name: [facility] Address: [address] [place], [place] [address] Phone: [phone] Email: [email or web address] Website: www.[email or web address].org Service(s) Offered: Housing services, self-sufficiency, congregate meal program, weatherization program, Field seismologist program, emergency food assistance,  housing counseling, home ownership program, wheels-towork program.  Agency Name: [facility] [employer] [phone]) Address: [address], [place], [place] [address] Phone: [phone] Website: www.acta-.com Service(s) Offered: Transportation for [employer], subscription and demand response; Dial-a-Ride for  citizens 62 years of age or older.  Agency Name: Department of Social Services Address: [address] [place], [place] [address] Phone: [phone] Service(s) Offered: Child support services; child welfare services; food stamps; Medicaid; work first family assistance; and aid with fuel,  rent, food and medicine, transportation assistance.  Agency Name: Disabled [employer] (DAV) Transportation  Network Phone: [phone] Service(s) Offered: Transports veterans to the [facility] medical center. Call  forty-eight hours in advance and leave the name, telephone  number, date, and time of appointment. Veteran will be  contacted by the driver the day before the appointment to  arrange a pick up point    [employer] ACTA currently provides door to door services. ACTA connects with PART daily for services to [facility]. ACTA also performs contract services to [employer] operates 27 vehicles, all but 3 mini-vans are equipped with lifts for special needs as well as the general public. ACTA drivers are each CDL certified and trained in First Aid and CPR. ACTA was established in 2002 by [employer]. An independent Industrial/product designer. ACTA operates via Cytogeneticist with required Research scientist (physical sciences) from [place]. ACTA provides over 80,000 passenger trips each year, including  [facility] and [employer] sites.  Call at least by 11 AM one business day prior to needing transportation  [employer].                      [place], [place] [address]     Office Hours: Monday-Friday  8 AM - 5 PM    Do you feel isolated?  The [facility] on Aging offers a [employer] that anyone can call toll free at [phone]. The friendship line is available 24 hours a day  [employer] is a Program of All-inclusive Care for the  Elderly (PACE). Their mission is to promote and sustain the independence of seniors wishing to remain in the community. They provide seniors with comprehensive long-term health, social, medical and dietary care. Their program is a safe alternative to nursing home care. [phone]  [facility] Physical Address [facility] [address] [place], [place] [address] Phone: [phone]. . Online zoom yoga class, connect with others without leaving your home [facility] offers [place] dance cardio sessions for individuals via Zoom. This program provides: - Dance fitness activities Please contact program for more information. Servinganyone in need adults 18+ hiv/aids individuals families Call [phone]  Email [email or web address] to get more info  [employer] offers an online [employer] to individuals where they can receive help to focus on their best health. Whether you're a [employer] member or not, the neighborhood center offers a... Main Serviceshealth education  exercise & fitness  community support services  recreation  virtual support Other Servicessupport groups Servinganyone in need adults young adults teens seniors individuals families [email or web address] to get more info  Schedule on their website  The [facility] offers an array of activities for adults age 84 and over. This program provides:- Fitness and health programs- Tech classes- Activity books Main Serviceshealth education  community support services  exercise & fitness  recreation  more education Servingseniors  Call [phone]    For more resources go online to [email or web address].[email or web address] and type in you zipcode

## 2024-03-21 NOTE — Progress Notes (Signed)
 Physical Therapy Treatment Patient Details Name: Traci Duran MRN: [id number] DOB: May 19, 1956 Today's Date: 03/21/2024   History of Present Illness presented to ER secondary to congestion, cough and LEs giving way/falls x3; admitted for management of acute cystitis, recurrent falls    PT Comments  Pt alert, up in bathroom with OT for handoff. Pt provided RW, and agreeable to utilize during stay and at discharge to maximize her safety. In total she was able to ambulate ~65ft with RW and CGA-supervision, but halfway through endorsed feeling too weak, and feeling like she was going to fall. BP assessed in LUE in standing: 87/67 (MAP 75), and immediately upon sitting 78/56 (65). Two more readings taken where BP remained low despite AROM of feet/knees and semi reclined in recliner. LUE reading stated 113/68, and RUE 92/60 at end of session. Care team updated on BP readings. The patient would benefit from further skilled PT intervention to continue to progress towards goals.     If plan is discharge home, recommend the following: A little help with walking and/or transfers;A little help with bathing/dressing/bathroom   Can travel by private vehicle        Equipment Recommendations  Rolling walker (2 wheels)    Recommendations for Other Services       Precautions / Restrictions Precautions Precautions: Fall Precaution/Restrictions Comments: chronic compression fractures T4, T9, T12 Restrictions Weight Bearing Restrictions Per Provider Order: No     Mobility  Bed Mobility               General bed mobility comments: up in bathroom with OT for PT handoff    Transfers Overall transfer level: Needs assistance Equipment used: Rolling walker (2 wheels) Transfers: Sit to/from Stand Sit to Stand: Supervision, Contact guard assist                Ambulation/Gait Ambulation/Gait assistance: Supervision, Contact guard assist Gait Distance (Feet): 65 Feet            General Gait Details: pt reported feeling weak and that she was going to fall if she did not start walking back to room. BP assessed, see clinical impression for details   Stairs             Wheelchair Mobility     Tilt Bed    Modified Rankin (Stroke Patients Only)       Balance Overall balance assessment: Needs assistance Sitting-balance support: No upper extremity supported, Feet supported Sitting balance-Leahy Scale: Good     Standing balance support: Reliant on assistive device for balance, During functional activity, Bilateral upper extremity supported Standing balance-Leahy Scale: Fair                              Communication    Cognition Arousal: Alert Behavior During Therapy: WFL for tasks assessed/performed                             Following commands: Intact      Cueing    Exercises      General Comments        Pertinent Vitals/Pain Pain Assessment Pain Assessment: No/denies pain    Home Living                          Prior Function            PT  Goals (current goals can now be found in the care plan section) Progress towards PT goals: Progressing toward goals    Frequency    Min 2X/week      PT Plan      Co-evaluation              AM-PAC PT "6 Clicks" Mobility   Outcome Measure  Help needed turning from your back to your side while in a flat bed without using bedrails?: None Help needed moving from lying on your back to sitting on the side of a flat bed without using bedrails?: None Help needed moving to and from a bed to a chair (including a wheelchair)?: None Help needed standing up from a chair using your arms (e.g., wheelchair or bedside chair)?: None Help needed to walk in hospital room?: A Little Help needed climbing 3-5 steps with a railing? : A Little 6 Click Score: 22    End of Session   Activity Tolerance: Patient limited by fatigue Patient left: in chair;with  call bell/phone within reach;with chair alarm set Nurse Communication: Mobility status;Other (comment) (BP concerns) PT Visit Diagnosis: Muscle weakness (generalized) (M62.81);Difficulty in walking, not elsewhere classified (R26.2)     Time: 1035-1050 PT Time Calculation (min) (ACUTE ONLY): 15 min  Charges:    $Therapeutic Activity: 8-22 mins PT General Charges $$ ACUTE PT VISIT: 1 Visit                     Darien Eden PT, DPT 11:12 AM,03/21/24

## 2024-03-21 NOTE — Care Management Important Message (Signed)
 Important Message  Patient Details  Name: MEKAELA AZIZI MRN: [id number] Date of Birth: 28-Dec-1955   Important Message Given:  Yes - Medicare IM     Anise Kerns 03/21/2024, 10:26 AM

## 2024-03-21 NOTE — TOC CM/SW Note (Signed)
 Patient is not able to walk the distance required to go the bathroom, or he/she is unable to safely negotiate stairs required to access the bathroom.  A 3in1 BSC will alleviate this problem

## 2024-03-21 NOTE — TOC Progression Note (Addendum)
 Transition of Care [facility]) - Progression Note    Patient Details  Name: Traci Duran MRN: [id number] Date of Birth: March 29, 1956  Transition of Care [facility] CM/SW Contact  Odilia Bennett, LCSW Phone Number: 03/21/2024, 11:55 AM  Clinical Narrative: Patient is agreeable to DME recommendation for 3-in-1. CSW asked MD to enter DME order and cosign narrative. Dietra Fraction will pick her up at discharge.    12:37 pm: Ordered 3-in-1 through [employer].  Expected Discharge Plan: Home w Home Health Services Barriers to Discharge: Continued Medical Work up  Expected Discharge Plan and Services     Post Acute Care Choice: Home Health Living arrangements for the past 2 months: Single Family Home                                       Social Determinants of Health (SDOH) Interventions SDOH Screenings   Food Insecurity: No Food Insecurity (03/18/2024)  Housing: High Risk (03/18/2024)  Transportation Needs: Unmet Transportation Needs (03/18/2024)  Utilities: Not At Risk (03/18/2024)  Financial Resource Strain: Low Risk  (06/03/2022)   Received from [facility], [facility]  Physical Activity: Inactive (11/09/2020)   Received from [facility], [facility]  Social Connections: Socially Isolated (03/18/2024)  Stress: Stress Concern Present (11/09/2020)   Received from [facility], [facility]  Tobacco Use: High Risk (03/17/2024)    Readmission Risk Interventions     No data to display

## 2024-03-22 LAB — CULTURE, BLOOD (ROUTINE X 2)

## 2024-03-23 LAB — CULTURE, BLOOD (ROUTINE X 2): Culture: NO GROWTH

## 2024-03-25 ENCOUNTER — Inpatient Hospital Stay

## 2024-03-25 ENCOUNTER — Other Ambulatory Visit: Payer: Self-pay | Admitting: Oncology

## 2024-03-25 VITALS — BP 94/56 | HR 67 | Temp 97.7°F | Resp 17

## 2024-03-25 DIAGNOSIS — D649 Anemia, unspecified: Secondary | ICD-10-CM

## 2024-03-25 DIAGNOSIS — D509 Iron deficiency anemia, unspecified: Secondary | ICD-10-CM

## 2024-03-25 DIAGNOSIS — D500 Iron deficiency anemia secondary to blood loss (chronic): Secondary | ICD-10-CM

## 2024-03-25 LAB — CBC (CANCER CENTER ONLY)
HCT: 25.8 % — ABNORMAL LOW (ref 36.0–46.0)
Hemoglobin: 7.7 g/dL — ABNORMAL LOW (ref 12.0–15.0)
MCH: 24.6 pg — ABNORMAL LOW (ref 26.0–34.0)
MCHC: 29.8 g/dL — ABNORMAL LOW (ref 30.0–36.0)
MCV: 82.4 fL (ref 80.0–100.0)
Platelet Count: 320 10*3/uL (ref 150–400)
RBC: 3.13 MIL/uL — ABNORMAL LOW (ref 3.87–5.11)
RDW: 17 % — ABNORMAL HIGH (ref 11.5–15.5)
WBC Count: 9.5 10*3/uL (ref 4.0–10.5)
nRBC: 0 % (ref 0.0–0.2)

## 2024-03-25 LAB — PREPARE RBC (CROSSMATCH)

## 2024-03-25 MED ORDER — ACETAMINOPHEN 325 MG PO TABS
650.0000 mg | ORAL_TABLET | Freq: Once | ORAL | Status: AC
  Administered 2024-03-25: 650 mg via ORAL
  Filled 2024-03-25: qty 2

## 2024-03-25 MED ORDER — IRON SUCROSE 20 MG/ML IV SOLN
200.0000 mg | INTRAVENOUS | Status: DC
  Administered 2024-03-25: 200 mg via INTRAVENOUS
  Filled 2024-03-25: qty 10

## 2024-03-25 MED ORDER — SODIUM CHLORIDE 0.9% FLUSH
10.0000 mL | Freq: Once | INTRAVENOUS | Status: AC | PRN
  Administered 2024-03-25: 10 mL
  Filled 2024-03-25: qty 10

## 2024-03-25 NOTE — Progress Notes (Signed)
 Notified MD patient here for Iron  dizzy, headache all morning 10/10 she has nothing to take at home, trouble with standing/walking she says requests Tylenol . Per MD proceed with Tylenol , Venofer  and labs.

## 2024-03-25 NOTE — Progress Notes (Signed)
 Patient aware 1 unit RBC scheduled for Monday at 9a

## 2024-03-28 ENCOUNTER — Inpatient Hospital Stay

## 2024-03-28 DIAGNOSIS — D649 Anemia, unspecified: Secondary | ICD-10-CM

## 2024-03-28 DIAGNOSIS — D509 Iron deficiency anemia, unspecified: Secondary | ICD-10-CM | POA: Diagnosis not present

## 2024-03-28 MED ORDER — ACETAMINOPHEN 325 MG PO TABS
650.0000 mg | ORAL_TABLET | Freq: Once | ORAL | Status: AC
Start: 2024-03-28 — End: 2024-03-28
  Administered 2024-03-28: 650 mg via ORAL
  Filled 2024-03-28: qty 2

## 2024-03-28 MED ORDER — SODIUM CHLORIDE 0.9% IV SOLUTION
250.0000 mL | INTRAVENOUS | Status: DC
  Administered 2024-03-28: 250 mL via INTRAVENOUS
  Filled 2024-03-28: qty 250

## 2024-03-29 LAB — BPAM RBC
Blood Product Expiration Date: 202505222359
ISSUE DATE / TIME: 202504210933
Unit Type and Rh: 202505222359
Unit Type and Rh: 5100

## 2024-03-29 LAB — TYPE AND SCREEN
ABO/RH(D): O POS
Antibody Screen: NEGATIVE
Unit division: 0

## 2024-03-30 ENCOUNTER — Inpatient Hospital Stay

## 2024-04-01 ENCOUNTER — Telehealth: Payer: Self-pay | Admitting: *Deleted

## 2024-04-01 NOTE — Telephone Encounter (Signed)
 Patient states that she had a call yesterday from cancer center. She was calling about what it was about. I do not see any notes about a call. I feels that it is automatic about her appts coming up at 04/04/24 at 3:30 FOR INFUSION  and again on 4/30 at 3:30. When I called her back she did not answer and her voice mail is full.           ;

## 2024-04-04 ENCOUNTER — Encounter: Payer: Self-pay | Admitting: Emergency Medicine

## 2024-04-04 ENCOUNTER — Inpatient Hospital Stay

## 2024-04-04 ENCOUNTER — Emergency Department

## 2024-04-04 ENCOUNTER — Emergency Department
Admission: EM | Admit: 2024-04-04 | Discharge: 2024-04-04 | Disposition: A | Discharge status: Home / Self Care | Attending: Emergency Medicine | Admitting: Emergency Medicine

## 2024-04-04 ENCOUNTER — Other Ambulatory Visit: Payer: Self-pay

## 2024-04-04 VITALS — BP 129/71 | HR 59 | Temp 99.0°F | Resp 18

## 2024-04-04 DIAGNOSIS — I110 Hypertensive heart disease with heart failure: Secondary | ICD-10-CM | POA: Diagnosis not present

## 2024-04-04 DIAGNOSIS — I509 Heart failure, unspecified: Secondary | ICD-10-CM | POA: Insufficient documentation

## 2024-04-04 DIAGNOSIS — D509 Iron deficiency anemia, unspecified: Secondary | ICD-10-CM | POA: Diagnosis not present

## 2024-04-04 DIAGNOSIS — R519 Headache, unspecified: Secondary | ICD-10-CM | POA: Insufficient documentation

## 2024-04-04 DIAGNOSIS — W19XXXA Unspecified fall, initial encounter: Secondary | ICD-10-CM | POA: Insufficient documentation

## 2024-04-04 DIAGNOSIS — I2510 Atherosclerotic heart disease of native coronary artery without angina pectoris: Secondary | ICD-10-CM | POA: Insufficient documentation

## 2024-04-04 DIAGNOSIS — D500 Iron deficiency anemia secondary to blood loss (chronic): Secondary | ICD-10-CM

## 2024-04-04 DIAGNOSIS — D649 Anemia, unspecified: Secondary | ICD-10-CM

## 2024-04-04 LAB — CBC WITH DIFFERENTIAL (CANCER CENTER ONLY)
Abs Immature Granulocytes: 0.03 K/uL (ref 0.00–0.07)
Basophils Absolute: 0 K/uL (ref 0.0–0.1)
Basophils Relative: 1 %
Eosinophils Absolute: 0.2 K/uL (ref 0.0–0.5)
Eosinophils Relative: 3 %
HCT: 31.9 % — ABNORMAL LOW (ref 36.0–46.0)
Hemoglobin: 9.9 g/dL — ABNORMAL LOW (ref 12.0–15.0)
Immature Granulocytes: 1 %
Lymphocytes Relative: 21 %
Lymphs Abs: 1.3 K/uL (ref 0.7–4.0)
MCH: 25.3 pg — ABNORMAL LOW (ref 26.0–34.0)
MCHC: 31 g/dL (ref 30.0–36.0)
MCV: 81.6 fL (ref 80.0–100.0)
Monocytes Absolute: 0.5 K/uL (ref 0.1–1.0)
Monocytes Relative: 8 %
Neutro Abs: 4.1 K/uL (ref 1.7–7.7)
Neutrophils Relative %: 66 %
Platelet Count: 290 K/uL (ref 150–400)
RBC: 3.91 MIL/uL (ref 3.87–5.11)
RDW: 17.3 % — ABNORMAL HIGH (ref 11.5–15.5)
WBC Count: 6.1 K/uL (ref 4.0–10.5)
nRBC: 0 % (ref 0.0–0.2)

## 2024-04-04 LAB — SAMPLE TO BLOOD BANK

## 2024-04-04 MED ORDER — OXYCODONE-ACETAMINOPHEN 7.5-325 MG PO TABS: 1.0000 | ORAL_TABLET | ORAL | 0 refills | Status: AC | PRN

## 2024-04-04 MED ORDER — IRON SUCROSE 20 MG/ML IV SOLN
200.0000 mg | INTRAVENOUS | Status: DC
  Administered 2024-04-04: 200 mg via INTRAVENOUS

## 2024-04-04 MED ORDER — SODIUM CHLORIDE 0.9% FLUSH
10.0000 mL | Freq: Once | INTRAVENOUS | Status: AC | PRN
  Administered 2024-04-04: 10 mL
  Filled 2024-04-04: qty 10

## 2024-04-04 MED ORDER — OXYCODONE-ACETAMINOPHEN 5-325 MG PO TABS
1.0000 | ORAL_TABLET | Freq: Once | ORAL | Status: AC
  Administered 2024-04-04: 1 via ORAL
  Filled 2024-04-04: qty 1

## 2024-04-04 MED ORDER — ACETAMINOPHEN 325 MG PO TABS
650.0000 mg | ORAL_TABLET | Freq: Once | ORAL | Status: AC
  Administered 2024-04-04: 650 mg via ORAL
  Filled 2024-04-04: qty 2

## 2024-04-04 NOTE — Progress Notes (Signed)
 pt reports that she fell 03/25/24 and 03/28/24. pt reports she hit her head both times. and she thinks she may have an concussion because " when I opened my eyes I saw a little girl in a blue easter dress, then I closed my eyes and opened them she was gone" and then this morning "I said why is the door opened, but then when I looked again it was closed and locked" she also thinks her rib is broken and reports that her tail bone is sore. Pt also reports she would like her blood counts checked again to see if she needs another blood transfusion. Pt also reports a headache reported an 8 out 10 and that "I do not have any Tylenol  at home".  Dr. Randy Buttery made aware of patients reports, Per Dr. Randy Buttery collect a CBC and hold tube, give Tylenol  650 MG PO x1, okay to give Venofer  and discharge per protocol if pt stable, pt to seek help from Urgent care or ER for further workup regarding these complaints. Pt refuses to go to the urgent care or ER. Pt educated of the importance of having these symptoms checked out, pt verbalizes understanding and that if symptoms continue she will go to the urgent care or emergency room.   1645: pt A&O x4 and stable at discharge.

## 2024-04-04 NOTE — ED Triage Notes (Signed)
 Pt presents to the ED via POV with complaints of headache following a fall a week ago. Pt denies LOC - not on thinners. Pt states that her PCP advised her to come here since she fell last week and hadn't had any imaging. A&Ox4 at this time. Denies CP or SOB.

## 2024-04-04 NOTE — ED Provider Notes (Signed)
 [facility] Emergency Department Provider Note     Event Date/Time   First MD Initiated Contact with Patient 04/04/24 2147     (approximate)   History   Fall   HPI  Traci Duran is a 68 y.o. female with a history of HTN, HLD, CAD, CHF presents to the ED following a fall x 1 week ago with complaint of headache resolved with Tylenol .  Patient reports she did not hit her head.  No LOC.  No vomiting.  She is not on blood thinners.  She reports she was advised by her PCP for imaging.  No other complaints    Physical Exam   Triage Vital Signs: ED Triage Vitals  Encounter Vitals Group     BP 04/04/24 1911 129/70     Systolic BP Percentile --      Diastolic BP Percentile --      Pulse Rate 04/04/24 1911 65     Resp 04/04/24 1911 18     Temp 04/04/24 1911 98 F (36.7 C)     Temp Source 04/04/24 1911 Oral     SpO2 04/04/24 1911 96 %     Weight 04/04/24 1912 142 lb 6.7 oz (64.6 kg)     Height 04/04/24 1912 5\' 7"  (1.702 m)     Head Circumference --      Peak Flow --      Pain Score 04/04/24 1913 8     Pain Loc --      Pain Education --      Exclude from Growth Chart --     Most recent vital signs: Vitals:   04/04/24 1911  BP: 129/70  Pulse: 65  Resp: 18  Temp: 98 F (36.7 C)  SpO2: 96%   General: Alert and oriented. INAD.  Skin:  Warm, dry and intact. No rashes or lesions noted.     Head:  NCAT.  Eyes:  PERRLA. EOMI.  Neck:   No cervical spine tenderness to palpation. Full ROM without difficulty.  CV:  Good peripheral perfusion. RRR. RESP:  Normal effort. LCTAB.  ABD:  No distention.  BACK:  Spinous process is midline without deformity or tenderness. MSK:   Full ROM in all joints. No swelling, deformity or tenderness.  NEURO: Cranial nerves intact. Sensation and motor function intact. 4/5 muscle strength of UE & LE.    ED Results / Procedures / Treatments   Labs (all labs ordered are listed, but only abnormal results are  displayed) Labs Reviewed - No data to display  RADIOLOGY  I personally viewed and evaluated these images as part of my medical decision making, as well as reviewing the written report by the radiologist.  ED Provider Interpretation: No acute abnormalities of the CT head and CT cervical spine  Normal lumbar x-ray  CT Cervical Spine Wo Contrast Result Date: 04/04/2024 EXAM: CT CERVICAL SPINE WITHOUT CONTRAST 04/04/2024 08:13:52 PM TECHNIQUE: CT of the cervical spine was performed without the administration of intravenous contrast. Multiplanar reformatted images are provided for review. Automated exposure control, iterative reconstruction, and/or weight based adjustment of the mA/kV was utilized to reduce the radiation dose to as low as reasonably achievable. COMPARISON: None available. CLINICAL HISTORY: Fall. FINDINGS: BONES/ALIGNMENT: There is no acute fracture or traumatic malalignment. DEGENERATIVE CHANGES: No significant degenerative changes. SOFT TISSUES: There is no prevertebral soft tissue swelling. IMPRESSION: 1. No acute abnormality of the cervical spine. Electronically signed by: Zadie Herter MD 04/04/2024 08:28 PM EDT  RP Workstation: [id number]   CT HEAD WO CONTRAST ( ) Result Date: 04/04/2024 EXAM: CT HEAD WITHOUT 04/04/2024 08:13:52 PM TECHNIQUE: CT of the head was performed without the administration of intravenous contrast. Automated exposure control, iterative reconstruction, and/or weight based adjustment of the mA/kV was utilized to reduce the radiation dose to as low as reasonably achievable. COMPARISON: 06/24/2023 CLINICAL HISTORY: Fall. FINDINGS: BRAIN AND VENTRICLES: There is no acute intracranial hemorrhage, mass effect or midline shift. No abnormal extra-axial fluid collection. The gray-white differentiation is maintained without evidence of an acute infarct. There is no evidence of hydrocephalus. Mild scattered periventricular and subcortical low density, likely reflecting  small vessel ischemic changes. ORBITS: The visualized portion of the orbits demonstrate no acute abnormality. SINUSES: The visualized paranasal sinuses and mastoid air cells demonstrate no acute abnormality. SOFT TISSUES AND SKULL: No acute abnormality of the visualized skull or soft tissues. VASCULATURE: Intracranial atherosclerosis. IMPRESSION: 1. No acute intracranial abnormality. 2. Mild small vessel ischemic changes. Electronically signed by: Zadie Herter MD 04/04/2024 08:26 PM EDT RP Workstation: [id number]   DG Lumbar Spine Complete Result Date: 04/04/2024 CLINICAL DATA:  Multiple falls, lower back pain EXAM: LUMBAR SPINE - COMPLETE 4+ VIEW COMPARISON:  03/17/2024 FINDINGS: Frontal, bilateral oblique, lateral views of the lumbar spine are obtained. There are 5 non-rib-bearing lumbar type vertebral bodies in normal anatomic alignment. There are no acute displaced fractures. Mild lower lumbar facet hypertrophy. Mild spondylosis at the L2-3 level. Sacroiliac joints are unremarkable. Diffuse atherosclerosis of the aorta and its branches. IMPRESSION: 1. Stable lower lumbar degenerative changes.  No acute fracture. Electronically Signed   By: Bobbye Burrow M.D.   On: 04/04/2024 20:09    PROCEDURES:  Critical Care performed: No  Procedures   MEDICATIONS ORDERED IN ED: Medications  oxyCODONE -acetaminophen  (PERCOCET/ROXICET) 5-325 MG per tablet 1 tablet (has no administration in time range)     IMPRESSION / MDM / ASSESSMENT AND PLAN / ED COURSE  I reviewed the triage vital signs and the nursing notes.                               68 y.o. female presents to the emergency department for evaluation and treatment of fall x 1 week ago. See HPI for further details.   Differential diagnosis includes, but is not limited to ICH, headache, fracture, disc herniation, radiculopathy  Patient's presentation is most consistent with acute complicated illness / injury requiring diagnostic  workup.  Patient alert and oriented.  She is hemodynamically stable.  Physical exam findings are stated above.  No red flag signs.  CT head, CT cervical spine and lumbar x-ray obtained in triage and overall reassuring.  ED pain management with Percocet.  Patient stable condition for discharge home.  Encouraged to follow-up with PCP x 1 week.  ED return precautions discussed.  All question concerns were addressed during this ED visit.  FINAL CLINICAL IMPRESSION(S) / ED DIAGNOSES   Final diagnoses:  Fall, initial encounter   Rx / DC Orders   ED Discharge Orders          Ordered    oxyCODONE -acetaminophen  (PERCOCET) 7.5-325 MG tablet  Every 4 hours PRN        04/04/24 2220             Note:  This document was prepared using Dragon voice recognition software and may include unintentional dictation errors.    Phyllis Breeze, Oluwatosin Higginson A, PA-C 04/04/24 [phone]  Arline Bennett, MD 04/04/24 [phone]

## 2024-04-04 NOTE — Discharge Instructions (Addendum)
 Your evaluated in the ED for a fall.  Your physical exam findings are reassuring.  Your CT scan and lumbar x-ray are all normal.  Please follow-up with your primary care for further further evaluation as needed.

## 2024-04-05 ENCOUNTER — Ambulatory Visit

## 2024-04-06 ENCOUNTER — Inpatient Hospital Stay

## 2024-04-06 VITALS — BP 111/76 | HR 62 | Resp 16

## 2024-04-06 DIAGNOSIS — D509 Iron deficiency anemia, unspecified: Secondary | ICD-10-CM | POA: Diagnosis not present

## 2024-04-06 DIAGNOSIS — D500 Iron deficiency anemia secondary to blood loss (chronic): Secondary | ICD-10-CM

## 2024-04-06 MED ORDER — IRON SUCROSE 20 MG/ML IV SOLN
200.0000 mg | INTRAVENOUS | Status: DC
  Administered 2024-04-06: 200 mg via INTRAVENOUS
  Filled 2024-04-06: qty 10

## 2024-04-06 NOTE — Patient Instructions (Signed)
 [facility] - A DEPT OF [facility]  Discharge Instructions: Thank you for choosing [facility] to provide your oncology and hematology care.  If you have a lab appointment with the Cancer Center, please go directly to the Cancer Center and check in at the registration area.  Wear comfortable clothing and clothing appropriate for easy access to any Portacath or PICC line.   We strive to give you quality time with your provider. You may need to reschedule your appointment if you arrive late (15 or more minutes).  Arriving late affects you and other patients whose appointments are after yours.  Also, if you miss three or more appointments without notifying the office, you may be dismissed from the clinic at the provider's discretion.      For prescription refill requests, have your pharmacy contact our office and allow 72 hours for refills to be completed.    Today you received the following chemotherapy and/or immunotherapy agents venofer      To help prevent nausea and vomiting after your treatment, we encourage you to take your nausea medication as directed.  BELOW ARE SYMPTOMS THAT SHOULD BE REPORTED IMMEDIATELY: *FEVER GREATER THAN 100.4 F (38 C) OR HIGHER *CHILLS OR SWEATING *NAUSEA AND VOMITING THAT IS NOT CONTROLLED WITH YOUR NAUSEA MEDICATION *UNUSUAL SHORTNESS OF BREATH *UNUSUAL BRUISING OR BLEEDING *URINARY PROBLEMS (pain or burning when urinating, or frequent urination) *BOWEL PROBLEMS (unusual diarrhea, constipation, pain near the anus) TENDERNESS IN MOUTH AND THROAT WITH OR WITHOUT PRESENCE OF ULCERS (sore throat, sores in mouth, or a toothache) UNUSUAL RASH, SWELLING OR PAIN  UNUSUAL VAGINAL DISCHARGE OR ITCHING   Items with * indicate a potential emergency and should be followed up as soon as possible or go to the Emergency Department if any problems should occur.  Please show the CHEMOTHERAPY ALERT CARD or IMMUNOTHERAPY  ALERT CARD at check-in to the Emergency Department and triage nurse.  Should you have questions after your visit or need to cancel or reschedule your appointment, please contact [facility] - A DEPT OF [facility]  [phone] and follow the prompts.  Office hours are 8:00 a.m. to 4:30 p.m. Monday - Friday. Please note that voicemails left after 4:00 p.m. may not be returned until the following business day.  We are closed weekends and major holidays. You have access to a nurse at all times for urgent questions. Please call the main number to the clinic [phone] and follow the prompts.  For any non-urgent questions, you may also contact your provider using MyChart. We now offer e-Visits for anyone 3 and older to request care online for non-urgent symptoms. For details visit [email or web address].   Also download the MyChart app! Go to the app store, search "MyChart", open the app, select [facility], and log in with your MyChart username and password.

## 2024-04-11 ENCOUNTER — Inpatient Hospital Stay

## 2024-04-11 ENCOUNTER — Inpatient Hospital Stay: Attending: Oncology

## 2024-04-11 VITALS — BP 141/83 | HR 66 | Temp 97.0°F | Resp 18

## 2024-04-11 DIAGNOSIS — Z79899 Other long term (current) drug therapy: Secondary | ICD-10-CM | POA: Diagnosis not present

## 2024-04-11 DIAGNOSIS — D509 Iron deficiency anemia, unspecified: Secondary | ICD-10-CM | POA: Diagnosis present

## 2024-04-11 DIAGNOSIS — D500 Iron deficiency anemia secondary to blood loss (chronic): Secondary | ICD-10-CM

## 2024-04-11 MED ORDER — IRON SUCROSE 20 MG/ML IV SOLN
200.0000 mg | INTRAVENOUS | Status: DC
  Administered 2024-04-11: 200 mg via INTRAVENOUS

## 2024-04-11 NOTE — Patient Instructions (Signed)

## 2024-04-13 ENCOUNTER — Inpatient Hospital Stay

## 2024-04-15 ENCOUNTER — Ambulatory Visit: Admitting: Cardiology

## 2024-04-20 ENCOUNTER — Inpatient Hospital Stay

## 2024-05-10 ENCOUNTER — Telehealth: Payer: Self-pay | Admitting: Cardiovascular Disease

## 2024-05-10 NOTE — Telephone Encounter (Signed)
Did not need this encounter °

## 2024-05-11 ENCOUNTER — Ambulatory Visit: Admitting: Cardiology

## 2024-05-18 ENCOUNTER — Ambulatory Visit: Attending: Cardiology | Admitting: Cardiology

## 2024-05-18 ENCOUNTER — Encounter: Payer: Self-pay | Admitting: Cardiology

## 2024-05-18 VITALS — BP 107/72 | HR 61 | Ht 67.0 in | Wt 134.0 lb

## 2024-05-18 DIAGNOSIS — E782 Mixed hyperlipidemia: Secondary | ICD-10-CM

## 2024-05-18 DIAGNOSIS — I10 Essential (primary) hypertension: Secondary | ICD-10-CM | POA: Diagnosis not present

## 2024-05-18 DIAGNOSIS — I340 Nonrheumatic mitral (valve) insufficiency: Secondary | ICD-10-CM

## 2024-05-18 DIAGNOSIS — I2510 Atherosclerotic heart disease of native coronary artery without angina pectoris: Secondary | ICD-10-CM | POA: Diagnosis not present

## 2024-05-18 NOTE — Progress Notes (Signed)
 Cardiology Office Note:    Date:  05/18/2024   ID:  Traci Duran, DOB 11-27-1956, MRN [id number]  PCP:  [facility]  Cardiologist:  Constancia Delton, MD  Electrophysiologist:  None   Referring MD: [facility]*   Chief Complaint  Patient presents with   Follow-up    12 month follow up pat has been doing well with no complaints of chest pain, chest pressure or SOB, medciation reviewed verbally with patient    History of Present Illness:    Traci Duran is a 68 y.o. female with a hx of CAD (three-vessel coronary calcification on chest CT ), moderate MR, anxiety, COPD, PAD, GI bleed, anemia, smoker x40+ years, who presents for follow-up.    Admitted 2 months ago due to symptoms of dizziness, diagnosed with orthostatic hypotension.  Lisinopril  was stopped upon discharge.  She still takes lisinopril  10 mg daily, endorses dizziness and occasional falls.  BP usually in the low 100s.  Also takes Coreg , Imdur .    Prior notes  Echo 10/2021 EF 60 to 65%, moderate MR Lexiscan  Myoview  08/2018 small apical anterior ischemia Chest CT lung cancer screening 09/26/2017 three-vessel coronary artery calcification.  Invasive work-up not planned due to history of anemia/GI bleeds.  Patient has a history of GI bleed requiring transfusions .   Past Medical History:  Diagnosis Date   Anemia 05/2020   Thought to be related to GI bleed.   Anxiety    Bilateral carotid artery stenosis without cerebral infarction 10/2010    November 2011, CTA of the head and neck: Near complete occlusion versus severe stenosis of nearly the entire basilar artery, complete occlusion of the right vertebral artery from its origin off the right SCA, moderate noncalcified atherosclerotic in the proximal left SCA which results in a 40% stenosis. Right ICA with approximately 15% stenosis, left   Nov. 2012 Carotid Duplex: >75% diameter re   CHF (congestive heart failure), chronic HFrEF NYHA  class II, chronic, diastolic (HCC)    Chronic HFrEF   COPD (chronic obstructive pulmonary disease) (HCC)    Coronary artery disease, non-occlusive 10/2009   Cardiac Cath: EF 85%;  40% prox &mid RCA -> 80% RPDA and AMrg, 60% RPAV.  40% prox & distal LAD w. 60% dLAD and D2, - Med Rx; Myoview  9/19: Small size, Mod severity Apical Anterior defect  c/w ISCHEMIA. -> INTERMEDIATE RISK (med Rx because of anemia, and known distal LAD and PDA disease. ->  Similar defect noted in August 2009)   Depression    Gastritis    GERD (gastroesophageal reflux disease)    Hemorrhoids    Hyperlipidemia    Hypertension    Mild aortic stenosis by prior echocardiogram 06/2020   Mild aortic stenosis by echo   Moderate to severe mitral regurgitation 06/2020   Echo at [facility]: Moderate-severe MR with mild RV enlargement and severely elevated PA 95 mmHg.   Myocardial infarction [facility])    Demand ischemia infarction initially in 2010   PAD (peripheral artery disease) (HCC) 07/2008   a) 8/'09: normal ABIs; b) post procedure Dopplers December 2010: Suggestion of left PFA/CFA-SFV AV fistula.; c) 02/2010: Left CFA-PFA AV fistula still present.; d) 04/2010: CTA Abd/Pelvis- LE Runoff: Extensive Mixed plaque in the Abd Ao.  Patent visceral As.  Bilat 2V runnoff-feet ATA & Peroneal A;  (confirmed by Arteriogram 03/2011); e) ABIs October 2017 R ABI 0.88, L ABI 0.79.   Pulmonary hypertension (HCC) 06/2020   [facility] echo shows  moderate-severe MR, moderate to severe TR with RVP estimated 95 mmHg.   Stenosis of left subclavian artery (HCC) 10/27/2010   ~75% by recent doppler -- CHECK BP ON R ARM (or bilaterally for confirmation)    Past Surgical History:  Procedure Laterality Date   APPENDECTOMY     COLONOSCOPY WITH PROPOFOL  N/A 06/09/2017   Procedure: COLONOSCOPY WITH PROPOFOL ;  Surgeon: Marnee Sink, MD;  Location: [facility] ENDOSCOPY;  Service: Endoscopy;  Laterality: N/A;   COLONOSCOPY WITH PROPOFOL  N/A 10/08/2019   Procedure: COLONOSCOPY  WITH PROPOFOL ;  Surgeon: Marnee Sink, MD;  Location: [facility] ENDOSCOPY;  Service: Endoscopy;  Laterality: N/A;   COLONOSCOPY WITH PROPOFOL  N/A 10/12/2022   Procedure: COLONOSCOPY WITH PROPOFOL ;  Surgeon: Selena Daily, MD;  Location: [facility] ENDOSCOPY;  Service: Gastroenterology;  Laterality: N/A;   ENTEROSCOPY N/A 10/08/2019   Procedure: ENTEROSCOPY;  Surgeon: Marnee Sink, MD;  Location: [facility] ENDOSCOPY;  Service: Endoscopy;  Laterality: N/A;   ENTEROSCOPY N/A 07/18/2020   Procedure: ENTEROSCOPY;  Surgeon: Selena Daily, MD;  Location: [facility] ENDOSCOPY;  Service: Gastroenterology;  Laterality: N/A;   ENTEROSCOPY N/A 04/07/2023   Procedure: ENTEROSCOPY;  Surgeon: Selena Daily, MD;  Location: [facility] ENDOSCOPY;  Service: Gastroenterology;  Laterality: N/A;  push   ESOPHAGOGASTRODUODENOSCOPY N/A 07/30/2017   Procedure: ESOPHAGOGASTRODUODENOSCOPY (EGD);  Surgeon: Selena Daily, MD;  Location: [facility] ENDOSCOPY;  Service: Gastroenterology;  Laterality: N/A;   ESOPHAGOGASTRODUODENOSCOPY (EGD) WITH PROPOFOL  N/A 04/09/2017   Procedure: ESOPHAGOGASTRODUODENOSCOPY (EGD) WITH PROPOFOL ;  Surgeon: Marnee Sink, MD;  Location: [facility] ENDOSCOPY;  Service: Endoscopy;  Laterality: N/A;   ESOPHAGOGASTRODUODENOSCOPY (EGD) WITH PROPOFOL  N/A 10/12/2022   Procedure: ESOPHAGOGASTRODUODENOSCOPY (EGD) WITH PROPOFOL ;  Surgeon: Selena Daily, MD;  Location: [facility] ENDOSCOPY;  Service: Gastroenterology;  Laterality: N/A;   GIVENS CAPSULE STUDY N/A 10/08/2019   Procedure: GIVENS CAPSULE STUDY;  Surgeon: Marnee Sink, MD;  Location: [facility] ENDOSCOPY;  Service: Endoscopy;  Laterality: N/A;   HIP FRACTURE SURGERY     LEFT HEART CATH AND CORONARY ANGIOGRAPHY  10/2009   [facility]: EF 85%;  40% prox &mid RCA -> 80% RPDA and AMrg, 60% RPAV.  40% prox & distal LAD w. 60% dLAD and D2, - Med Rx   LOWER EXTREMITY ARTERIOGRAM Bilateral 03/28/2011   ([facility]) RLE: moderate focal prox. R> CIA stenosis, patent PFA, SFA, CFA, 2  vessel runoff via PT/peroneal, AT proximally occluded. LLE: moderate CFA stenosis, patent PFA, SFA and PA, 2 vessel runoff via AT/peroneal, PT occluded.   NM GATED MYOVIEW  ([facility] HX)  08/2018   Small sized mild severity defect in the apical anterior wall concerning for ischemia.  INTERMEDIATE RISK. (Appears to be no change from 2009)   NM MYOVIEW  LTD  07/2008   ([facility]): A) 07/2008: Minimal reversible defect in the apex with possible ischemia.  Likely represents artifact.  EF 59% -> Cath: 80% PDA, 60% PAV and distal LAD dZ); B) 08/2010: Lexiscan  - > ischemia the Ant & Inf Apex.  EF 64%.(Med Rx); C) Jan 2013: + Apical Ischemia - unchanged; D) Jan 2016: EF 76%. No Ischemia /Infarct.   ORIF ELBOW FRACTURE Left 09/28/2023   Procedure: LEFT Open Reduction Internal fixation (ORIF), fracture, elbow/olecranon;  Surgeon: Wilhelmenia Harada, MD;  Location: [facility] ORS;  Service: Orthopedics;  Laterality: Left;   TRANSTHORACIC ECHOCARDIOGRAM  11/2009   ([facility]) A) December 2010, 2D echo: EF >55%, mild concentric LVH, diastolic dysfunction Gr. I, LVIDd 4.2 cm, LVIDs 2.3 cm, mild TR (peak RVP 28 mmHg).; B) November 2011, 2D  echo: EF >55%, mild concentric LVH, diastolic dysfunction Gr. I, LVIDd 4.3 cm, LVIDs 1.7 cm, mild PR (peak RVP 25 mmHg).; C) 12/2014 Echo: EF >55%, mild LVH, DD gr. I, LAE 3.9cm, Moderate TR (PRVP 35 mmHg)   TRANSTHORACIC ECHOCARDIOGRAM  06/13/2020   [facility]: EF 60-65%.  Normal LV function.  Indeterminate diastolic pressures.  Mild RV enlargement with severely elevated PAP estimated 95. moderate biatrial dilation.  Moderate to severe MR.  Moderate to severe TR.  Mild AS.    Current Medications: Current Meds  Medication Sig   albuterol  (PROVENTIL ) (2.5 MG/3ML) 0.083% nebulizer solution Take 3 mLs (2.5 mg total) by nebulization every 6 (six) hours as needed for wheezing or shortness of breath.   albuterol  (VENTOLIN  HFA) 108 (90 Base) MCG/ACT inhaler Inhale 2 puffs into the  lungs every 4 (four) hours as needed for wheezing or shortness of breath.   alendronate (FOSAMAX) 70 MG tablet Take 70 mg by mouth once a week.   alprazolam  (XANAX ) 2 MG tablet Take 2 mg by mouth 2 (two) times daily.    atorvastatin  (LIPITOR ) 80 MG tablet Take 80 mg by mouth daily.   budesonide -formoterol  (SYMBICORT) 160-4.5 MCG/ACT inhaler Inhale 2 puffs into the lungs 2 (two) times daily.   busPIRone  (BUSPAR ) 10 MG tablet Take 10 mg by mouth 2 (two) times daily.    carvedilol  (COREG ) 6.25 MG tablet Take 6.25 mg by mouth 2 (two) times daily.   citalopram  (CELEXA ) 40 MG tablet Take 40 mg by mouth daily.   DULoxetine  (CYMBALTA ) 60 MG capsule Take 60 mg by mouth daily.   furosemide  (LASIX ) 40 MG tablet Take 40 mg by mouth every morning.   gabapentin  (NEURONTIN ) 400 MG capsule Take 400 mg by mouth 3 (three) times daily.   isosorbide  mononitrate (IMDUR ) 30 MG 24 hr tablet Take 30 mg by mouth daily.   levofloxacin  (LEVAQUIN ) 750 MG tablet Take 1 tablet (750 mg total) by mouth every other day.   nitroGLYCERIN  (NITROSTAT ) 0.4 MG SL tablet Place 0.4 mg under the tongue every 5 (five) minutes as needed for chest pain.   oxyCODONE -acetaminophen  (PERCOCET) 7.5-325 MG tablet Take 1 tablet by mouth every 4 (four) hours as needed for severe pain (pain score 7-10).     Allergies:   Chantix [varenicline], Diphenhydramine hcl, Benadryl [diphenhydramine hcl], Niacin, and Trazodone    Social History   Socioeconomic History   Marital status: Widowed    Spouse name: Not on file   Number of children: 3   Years of education: Not on file   Highest education level: Not on file  Occupational History   Not on file  Tobacco Use   Smoking status: Every Day    Current packs/day: 1.00    Average packs/day: 1 pack/day for 46.0 years (46.0 ttl pk-yrs)    Types: Cigarettes   Smokeless tobacco: Never   Tobacco comments:    0.5PPD 02/17/2022  Vaping Use   Vaping status: Never Used  Substance and Sexual Activity    Alcohol use: No   Drug use: Yes    Types: Marijuana    Comment: occasional   Sexual activity: Not Currently  Other Topics Concern   Not on file  Social History Narrative   She is a widowed mother of at least 3 daughters.  Unfortunately all 3 daughters have deceased.        2015: Her youngest daughter and son-in-law were run over by an [place] train   14-Jun-2017: Middle daughter died of complications  of DKA   April 2022: Oldest daughter died cancer after being on palliative care.   She also had a dog that died after being with her for 18 years.   Social Drivers of Corporate investment banker Strain: Low Risk  (06/03/2022)   Received from [facility], [facility]   Overall Financial Resource Strain (CARDIA)    Difficulty of Paying Living Expenses: Not hard at all  Food Insecurity: No Food Insecurity (03/18/2024)   Hunger Vital Sign    Worried About Running Out of Food in the Last Year: Never true    Ran Out of Food in the Last Year: Never true  Transportation Needs: Unmet Transportation Needs (03/18/2024)   PRAPARE - Administrator, Civil Service (Medical): Yes    Lack of Transportation (Non-Medical): No  Physical Activity: Inactive (11/09/2020)   Received from [facility], [facility]   Exercise Vital Sign    Days of Exercise per Week: 0 days    Minutes of Exercise per Session: 0 min  Stress: Stress Concern Present (11/09/2020)   Received from [facility], [facility]   [employer] of Occupational Health - Occupational Stress Questionnaire    Feeling of Stress : Rather much  Social Connections: Socially Isolated (03/18/2024)   Social Connection and Isolation Panel [NHANES]    Frequency of Communication with Friends and Family: More than three times a week    Frequency of Social Gatherings with Friends and Family: Never    Attends Religious Services: Never    Automotive engineer or Organizations: No    Attends Banker Meetings: Never    Marital Status: Widowed     Family History: The patient's family history includes Heart attack in her brother, father, and mother; Heart failure in her mother.  ROS:   Please see the history of present illness.     All other systems reviewed and are negative.  EKGs/Labs/Other Studies Reviewed:    The following studies were reviewed today:   EKG Interpretation Date/Time:  Wednesday May 18 2024 10:34:59 EDT Ventricular Rate:  61 PR Interval:  148 QRS Duration:  90 QT Interval:  474 QTC Calculation: 477 R Axis:   5  Text Interpretation: Normal sinus rhythm Septal infarct , age undetermined Confirmed by Constancia Delton ([id number]) on 05/18/2024 11:01:04 AM    Recent Labs: 03/17/2024: ALT 8 03/18/2024: TSH 1.068 03/21/2024: BUN 25; Creatinine, Ser 1.55; Potassium 3.5; Sodium 134 04/04/2024: Hemoglobin 9.9; Platelet Count 290  Recent Lipid Panel    Component Value Date/Time   CHOL 214 (H) 10/15/2011 0506   TRIG 225 (H) 10/15/2011 0506   HDL 45 10/15/2011 0506   CHOLHDL 4.8 10/15/2011 0506   VLDL 45 (H) 10/15/2011 0506   LDLCALC 124 (H) 10/15/2011 0506   Outside lipid panel 04/2024 total cholesterol 148, LDL 78, HDL 45, triglyceride 126  Physical Exam:    VS:  BP 107/72 (BP Location: Left Arm, Patient Position: Sitting, Cuff Size: Normal)   Pulse 61   Ht 5' 7 (1.702 m)   Wt 134 lb (60.8 kg)   SpO2 94%   BMI 20.99 kg/m     Wt Readings from Last 3 Encounters:  05/18/24 134 lb (60.8 kg)  04/04/24 142 lb 6.7 oz (64.6 kg)  03/17/24 141 lb 1.5 oz (64 kg)     GEN:  Well nourished, appears fatigued HEENT: Normal NECK: No  JVD; No carotid bruits CARDIAC: RRR, 2/6 systolic murmur, rubs, gallops RESPIRATORY:  Clear to auscultation without rales, wheezing or rhonchi  ABDOMEN: Soft, non-tender, non-distended MUSCULOSKELETAL:  No edema; No deformity  SKIN: Warm and dry NEUROLOGIC:  Alert  and oriented x 3 PSYCHIATRIC:  Normal affect   ASSESSMENT:    1. Primary hypertension   2. Coronary artery disease involving native coronary artery of native heart, unspecified whether angina present   3. Mitral valve insufficiency, unspecified etiology   4. Mixed hyperlipidemia    PLAN:    In order of problems listed above:  History of hypertension, recent admission with orthostasis, BP low normal.  Lisinopril  was supposed to be discontinued upon discharge, patient is still taking.  Advised to stop lisinopril , monitor BP at home and keep log.  Continue Coreg  6.25 mg twice daily, Imdur  30 mg daily. CAD, three-vessel coronary calcification, prior abnormal stress test.  EF 60 to 65%, severe pulmonary hypertension likely WHO class III from COPD.  Denies chest pain.  Continue Imdur  30 mg daily, Coreg  6.25 mg twice daily, Lipitor  80 mg.   Due to history of GI bleeds, requiring transfusions, will refrain from any invasive strategy at this time.  Aspirin  previously stopped Moderate mitral regurgitation, monitor with serial echocardiograms.  Repeat echo. Hypertension, BP controlled/low normal with occasional dizziness.  Stop lisinopril . continue Coreg , Imdur  30 mg daily. Mixed hyperlipidemia, cholesterol controlled.  Continue Lipitor  80 mg daily.  Follow-up after echo    Medication Adjustments/Labs and Tests Ordered: Current medicines are reviewed at length with the patient today.  Concerns regarding medicines are outlined above.  Orders Placed This Encounter  Procedures   EKG 12-Lead   ECHOCARDIOGRAM COMPLETE   No orders of the defined types were placed in this encounter.   Patient Instructions  Medication Instructions:  Your physician recommends the following medication changes.  STOP TAKING: Lisinopril    *If you need a refill on your cardiac medications before your next appointment, please call your pharmacy*  Lab Work: No labs ordered today  If you have labs (blood work)  drawn today and your tests are completely normal, you will receive your results only by: MyChart Message (if you have MyChart) OR A paper copy in the mail If you have any lab test that is abnormal or we need to change your treatment, we will call you to review the results.  Testing/Procedures: Your physician has requested that you have an echocardiogram. Echocardiography is a painless test that uses sound waves to create images of your heart. It provides your doctor with information about the size and shape of your heart and how well your heart's Games and valves are working.   You may receive an ultrasound enhancing agent through an IV if needed to better visualize your heart during the echo. This procedure takes approximately one hour.  There are no restrictions for this procedure.  This will take place at [address] [facility] [address] [facility]) [address], [place] [address]  Please note: We ask at that you not bring children with you during ultrasound (echo/ vascular) testing. Due to room size and safety concerns, children are not allowed in the ultrasound rooms during exams. Our front office staff cannot provide observation of children in our lobby area while testing is being conducted. An adult accompanying a patient to their appointment will only be allowed in the ultrasound room at the discretion of the ultrasound technician under special circumstances. We apologize for any inconvenience.   Follow-Up: At [facility], you and your health needs are our priority.  As part of our continuing mission to provide you with exceptional heart care, our providers are all part of one team.  This team includes your primary Cardiologist (physician) and Advanced Practice Providers or APPs (Physician Assistants and Nurse Practitioners) who all work together to provide you with the care you need, when you need it.  Your next appointment:   2 month(s)  Provider:   You may see Constancia Delton,  MD or one of the following Advanced Practice Providers on your designated Care Team:   Laneta Pintos, NP Gildardo Labrador, PA-C Varney Gentleman, PA-C Cadence Bayard, PA-C Ronald Cockayne, NP Morey Ar, NP    We recommend signing up for the patient portal called MyChart.  Sign up information is provided on this After Visit Summary.  MyChart is used to connect with patients for Virtual Visits (Telemedicine).  Patients are able to view lab/test results, encounter notes, upcoming appointments, etc.  Non-urgent messages can be sent to your provider as well.   To learn more about what you can do with MyChart, go to [email or web address].          Signed, Constancia Delton, MD  05/18/2024 12:01 PM    [facility]

## 2024-05-18 NOTE — Patient Instructions (Signed)
 Medication Instructions:  Your physician recommends the following medication changes.  STOP TAKING: Lisinopril    *If you need a refill on your cardiac medications before your next appointment, please call your pharmacy*  Lab Work: No labs ordered today  If you have labs (blood work) drawn today and your tests are completely normal, you will receive your results only by: MyChart Message (if you have MyChart) OR A paper copy in the mail If you have any lab test that is abnormal or we need to change your treatment, we will call you to review the results.  Testing/Procedures: Your physician has requested that you have an echocardiogram. Echocardiography is a painless test that uses sound waves to create images of your heart. It provides your doctor with information about the size and shape of your heart and how well your heart's Sanko and valves are working.   You may receive an ultrasound enhancing agent through an IV if needed to better visualize your heart during the echo. This procedure takes approximately one hour.  There are no restrictions for this procedure.  This will take place at [address] [facility] [address], [place] [address]  Please note: We ask at that you not bring children with you during ultrasound (echo/ vascular) testing. Due to room size and safety concerns, children are not allowed in the ultrasound rooms during exams. Our front office staff cannot provide observation of children in our lobby area while testing is being conducted. An adult accompanying a patient to their appointment will only be allowed in the ultrasound room at the discretion of the ultrasound technician under special circumstances. We apologize for any inconvenience.   Follow-Up: At [facility], you and your health needs are our priority.  As part of our continuing mission to provide you with exceptional heart care, our providers are all part of one team.  This team  includes your primary Cardiologist (physician) and Advanced Practice Providers or APPs (Physician Assistants and Nurse Practitioners) who all work together to provide you with the care you need, when you need it.  Your next appointment:   2 month(s)  Provider:   You may see Constancia Delton, MD or one of the following Advanced Practice Providers on your designated Care Team:   Laneta Pintos, NP Gildardo Labrador, PA-C Varney Gentleman, PA-C Cadence Bieber, PA-C Ronald Cockayne, NP Morey Ar, NP    We recommend signing up for the patient portal called MyChart.  Sign up information is provided on this After Visit Summary.  MyChart is used to connect with patients for Virtual Visits (Telemedicine).  Patients are able to view lab/test results, encounter notes, upcoming appointments, etc.  Non-urgent messages can be sent to your provider as well.   To learn more about what you can do with MyChart, go to [email or web address].

## 2024-06-03 ENCOUNTER — Ambulatory Visit

## 2024-06-13 ENCOUNTER — Inpatient Hospital Stay: Attending: Oncology

## 2024-06-23 ENCOUNTER — Ambulatory Visit

## 2024-07-22 ENCOUNTER — Ambulatory Visit: Attending: Cardiology | Admitting: Cardiology

## 2024-09-09 ENCOUNTER — Other Ambulatory Visit: Payer: Self-pay | Admitting: *Deleted

## 2024-09-09 DIAGNOSIS — D500 Iron deficiency anemia secondary to blood loss (chronic): Secondary | ICD-10-CM

## 2024-09-12 ENCOUNTER — Inpatient Hospital Stay: Admitting: Oncology

## 2024-09-12 ENCOUNTER — Encounter: Payer: Self-pay | Admitting: Oncology

## 2024-09-12 ENCOUNTER — Inpatient Hospital Stay: Attending: Oncology

## 2024-09-12 VITALS — BP 142/90 | HR 65 | Temp 98.1°F | Resp 17 | Wt 137.0 lb

## 2024-09-12 DIAGNOSIS — F17210 Nicotine dependence, cigarettes, uncomplicated: Secondary | ICD-10-CM | POA: Insufficient documentation

## 2024-09-12 DIAGNOSIS — D509 Iron deficiency anemia, unspecified: Secondary | ICD-10-CM

## 2024-09-12 DIAGNOSIS — M549 Dorsalgia, unspecified: Secondary | ICD-10-CM | POA: Insufficient documentation

## 2024-09-12 DIAGNOSIS — E538 Deficiency of other specified B group vitamins: Secondary | ICD-10-CM

## 2024-09-12 DIAGNOSIS — Z79899 Other long term (current) drug therapy: Secondary | ICD-10-CM | POA: Diagnosis not present

## 2024-09-12 DIAGNOSIS — R300 Dysuria: Secondary | ICD-10-CM | POA: Insufficient documentation

## 2024-09-12 DIAGNOSIS — J449 Chronic obstructive pulmonary disease, unspecified: Secondary | ICD-10-CM | POA: Diagnosis not present

## 2024-09-12 DIAGNOSIS — Z7951 Long term (current) use of inhaled steroids: Secondary | ICD-10-CM | POA: Insufficient documentation

## 2024-09-12 DIAGNOSIS — Z7983 Long term (current) use of bisphosphonates: Secondary | ICD-10-CM | POA: Diagnosis not present

## 2024-09-12 DIAGNOSIS — D500 Iron deficiency anemia secondary to blood loss (chronic): Secondary | ICD-10-CM

## 2024-09-12 LAB — URINALYSIS, COMPLETE (UACMP) WITH MICROSCOPIC
Bacteria, UA: NONE SEEN
Bilirubin Urine: NEGATIVE
Glucose, UA: NEGATIVE mg/dL
Ketones, ur: NEGATIVE mg/dL
Nitrite: NEGATIVE
Protein, ur: NEGATIVE mg/dL
Specific Gravity, Urine: 1.005 (ref 1.005–1.030)
pH: 5 (ref 5.0–8.0)

## 2024-09-12 LAB — CBC WITH DIFFERENTIAL (CANCER CENTER ONLY)
Abs Immature Granulocytes: 0.04 K/uL (ref 0.00–0.07)
Basophils Absolute: 0.1 K/uL (ref 0.0–0.1)
Basophils Relative: 1 %
Eosinophils Absolute: 0.1 K/uL (ref 0.0–0.5)
Eosinophils Relative: 1 %
HCT: 38.5 % (ref 36.0–46.0)
Hemoglobin: 13.2 g/dL (ref 12.0–15.0)
Immature Granulocytes: 0 %
Lymphocytes Relative: 24 %
Lymphs Abs: 2.2 K/uL (ref 0.7–4.0)
MCH: 31.5 pg (ref 26.0–34.0)
MCHC: 34.3 g/dL (ref 30.0–36.0)
MCV: 91.9 fL (ref 80.0–100.0)
Monocytes Absolute: 0.6 K/uL (ref 0.1–1.0)
Monocytes Relative: 7 %
Neutro Abs: 6.1 K/uL (ref 1.7–7.7)
Neutrophils Relative %: 67 %
Platelet Count: 240 K/uL (ref 150–400)
RBC: 4.19 MIL/uL (ref 3.87–5.11)
RDW: 13.1 % (ref 11.5–15.5)
WBC Count: 9 K/uL (ref 4.0–10.5)
nRBC: 0 % (ref 0.0–0.2)

## 2024-09-12 LAB — IRON AND TIBC
Iron: 64 ug/dL (ref 28–170)
Saturation Ratios: 23 % (ref 10.4–31.8)
TIBC: 280 ug/dL (ref 250–450)
UIBC: 216 ug/dL

## 2024-09-12 LAB — FERRITIN: Ferritin: 66 ng/mL (ref 11–307)

## 2024-09-12 LAB — VITAMIN B12: Vitamin B-12: 327 pg/mL (ref 180–914)

## 2024-09-12 NOTE — Progress Notes (Signed)
 Hematology/Oncology Consult note [facility]  [phone] [phone]  Patient Care Team: [facility] as PCP - General Darliss Rogue, MD as PCP - Cardiology (Cardiology) Melanee Annah BROCKS, MD as Consulting Physician (Oncology)   Name of the patient: Traci Duran  [id number]  [birth year]   Date of visit: 09/12/24  Diagnosis-iron  and B12 deficiency anemia  Chief complaint/ Reason for visit-routine follow-up of anemia  Heme/Onc history:  Patient is a 68 year old female referred for iron  deficiency anemia.  Most recent CBC from 07/16/2020 showed white count of 7.9, H&H of 7.2/25.6 with an MCV of 74.9 and a platelet count of 280.  Ferritin levels were low at 19 and iron  studies showed low iron  saturation of 4%.  B12 was low at 187.  Folate was normal.  She also underwent small bowel endoscopy for obscure GI bleeding which showed no evidence of any pathology in proximal jejunum medium sized hiatal hernia she has previously undergone EGD and colonoscopy in October and November 2020 as well she is here for consideration of IV iron .Patient had a repeat EGD and colonoscopy in November 2023.  EGD showed normal stomach and duodenal bulb.  Reflux esophagitis with no bleeding.  Colonoscopy showed nonbleeding external hemorrhoids.  2 polyps in the transverse colon which were resected and was negative for malignancy.  Patient has had complete anemia workup in the past in April 2024 as well when myeloma panel showed no M protein.  Serum free light chain was normal.  B12 levels were mildly low at 265.  Folate levels normal haptoglobin and reticulocyte count normal. Patient has tolerated IV iron  well in the past      Interval history- She experiences significant congestion, describing it as 'spitting up glue,' which is heavy and causes shortness of breath. This symptom limits her ability to walk around, as it exacerbates her back pain.  She also  experiences pain in her legs and feet every night and inquires about the possibility of resuming Lyrica  for pain management.  She also reports foul-smelling urine today  ECOG PS- 2 Pain scale- 5   Review of systems- Review of Systems  Constitutional:  Positive for malaise/fatigue. Negative for chills, fever and weight loss.  HENT:  Negative for congestion, ear discharge and nosebleeds.   Eyes:  Negative for blurred vision.  Respiratory:  Positive for cough and shortness of breath. Negative for hemoptysis, sputum production and wheezing.   Cardiovascular:  Negative for chest pain, palpitations, orthopnea and claudication.  Gastrointestinal:  Negative for abdominal pain, blood in stool, constipation, diarrhea, heartburn, melena, nausea and vomiting.  Genitourinary:  Positive for dysuria. Negative for flank pain, frequency, hematuria and urgency.  Musculoskeletal:  Positive for back pain. Negative for joint pain and myalgias.  Skin:  Negative for rash.  Neurological:  Negative for dizziness, tingling, focal weakness, seizures, weakness and headaches.  Endo/Heme/Allergies:  Does not bruise/bleed easily.  Psychiatric/Behavioral:  Negative for depression and suicidal ideas. The patient does not have insomnia.       Allergies  Allergen Reactions   Chantix [Varenicline] Hives, Shortness Of Breath and Palpitations   Diphenhydramine Hcl Shortness Of Breath   Benadryl [Diphenhydramine Hcl] Rash   Niacin Rash   Trazodone  Palpitations     Past Medical History:  Diagnosis Date   Anemia 05/2020   Thought to be related to GI bleed.   Anxiety    Bilateral carotid artery stenosis without cerebral infarction 10/2010    November 2011,  CTA of the head and neck: Near complete occlusion versus severe stenosis of nearly the entire basilar artery, complete occlusion of the right vertebral artery from its origin off the right SCA, moderate noncalcified atherosclerotic in the proximal left SCA which  results in a 40% stenosis. Right ICA with approximately 15% stenosis, left   Nov. 2012 Carotid Duplex: >75% diameter re   CHF (congestive heart failure), chronic HFrEF NYHA class II, chronic, diastolic (HCC)    Chronic HFrEF   COPD (chronic obstructive pulmonary disease) (HCC)    Coronary artery disease, non-occlusive 10/2009   Cardiac Cath: EF 85%;  40% prox &mid RCA -> 80% RPDA and AMrg, 60% RPAV.  40% prox & distal LAD w. 60% dLAD and D2, - Med Rx; Myoview  9/19: Small size, Mod severity Apical Anterior defect  c/w ISCHEMIA. -> INTERMEDIATE RISK (med Rx because of anemia, and known distal LAD and PDA disease. ->  Similar defect noted in August 2009)   Depression    Gastritis    GERD (gastroesophageal reflux disease)    Hemorrhoids    Hyperlipidemia    Hypertension    Mild aortic stenosis by prior echocardiogram 06/2020   Mild aortic stenosis by echo   Moderate to severe mitral regurgitation 06/2020   Echo at [facility]: Moderate-severe MR with mild RV enlargement and severely elevated PA 95 mmHg.   Myocardial infarction [facility])    Demand ischemia infarction initially in 2010   PAD (peripheral artery disease) 07/2008   a) 8/'09: normal ABIs; b) post procedure Dopplers December 2010: Suggestion of left PFA/CFA-SFV AV fistula.; c) 02/2010: Left CFA-PFA AV fistula still present.; d) 04/2010: CTA Abd/Pelvis- LE Runoff: Extensive Mixed plaque in the Abd Ao.  Patent visceral As.  Bilat 2V runnoff-feet ATA & Peroneal A;  (confirmed by Arteriogram 03/2011); e) ABIs October 2017 R ABI 0.88, L ABI 0.79.   Pulmonary hypertension (HCC) 06/2020   [facility] echo shows moderate-severe MR, moderate to severe TR with RVP estimated 95 mmHg.   Stenosis of left subclavian artery 10/27/2010   ~75% by recent doppler -- CHECK BP ON R ARM (or bilaterally for confirmation)     Past Surgical History:  Procedure Laterality Date   APPENDECTOMY     COLONOSCOPY WITH PROPOFOL  N/A 06/09/2017   Procedure: COLONOSCOPY WITH PROPOFOL ;   Surgeon: Jinny Carmine, MD;  Location: [facility] ENDOSCOPY;  Service: Endoscopy;  Laterality: N/A;   COLONOSCOPY WITH PROPOFOL  N/A 10/08/2019   Procedure: COLONOSCOPY WITH PROPOFOL ;  Surgeon: Jinny Carmine, MD;  Location: [facility] ENDOSCOPY;  Service: Endoscopy;  Laterality: N/A;   COLONOSCOPY WITH PROPOFOL  N/A 10/12/2022   Procedure: COLONOSCOPY WITH PROPOFOL ;  Surgeon: Unk Corinn Skiff, MD;  Location: [facility] ENDOSCOPY;  Service: Gastroenterology;  Laterality: N/A;   ENTEROSCOPY N/A 10/08/2019   Procedure: ENTEROSCOPY;  Surgeon: Jinny Carmine, MD;  Location: [facility] ENDOSCOPY;  Service: Endoscopy;  Laterality: N/A;   ENTEROSCOPY N/A 07/18/2020   Procedure: ENTEROSCOPY;  Surgeon: Unk Corinn Skiff, MD;  Location: [facility] ENDOSCOPY;  Service: Gastroenterology;  Laterality: N/A;   ENTEROSCOPY N/A 04/07/2023   Procedure: ENTEROSCOPY;  Surgeon: Unk Corinn Skiff, MD;  Location: [facility] ENDOSCOPY;  Service: Gastroenterology;  Laterality: N/A;  push   ESOPHAGOGASTRODUODENOSCOPY N/A 07/30/2017   Procedure: ESOPHAGOGASTRODUODENOSCOPY (EGD);  Surgeon: Unk Corinn Skiff, MD;  Location: [facility] ENDOSCOPY;  Service: Gastroenterology;  Laterality: N/A;   ESOPHAGOGASTRODUODENOSCOPY (EGD) WITH PROPOFOL  N/A 04/09/2017   Procedure: ESOPHAGOGASTRODUODENOSCOPY (EGD) WITH PROPOFOL ;  Surgeon: Carmine Jinny, MD;  Location: [facility] ENDOSCOPY;  Service: Endoscopy;  Laterality: N/A;  ESOPHAGOGASTRODUODENOSCOPY (EGD) WITH PROPOFOL  N/A 10/12/2022   Procedure: ESOPHAGOGASTRODUODENOSCOPY (EGD) WITH PROPOFOL ;  Surgeon: Unk Corinn Skiff, MD;  Location: [facility] ENDOSCOPY;  Service: Gastroenterology;  Laterality: N/A;   GIVENS CAPSULE STUDY N/A 10/08/2019   Procedure: GIVENS CAPSULE STUDY;  Surgeon: Jinny Carmine, MD;  Location: [facility] ENDOSCOPY;  Service: Endoscopy;  Laterality: N/A;   HIP FRACTURE SURGERY     LEFT HEART CATH AND CORONARY ANGIOGRAPHY  10/2009   [facility]: EF 85%;  40% prox &mid RCA -> 80% RPDA and AMrg, 60% RPAV.  40% prox &  distal LAD w. 60% dLAD and D2, - Med Rx   LOWER EXTREMITY ARTERIOGRAM Bilateral 03/28/2011   ([facility]) RLE: moderate focal prox. R> CIA stenosis, patent PFA, SFA, CFA, 2 vessel runoff via PT/peroneal, AT proximally occluded. LLE: moderate CFA stenosis, patent PFA, SFA and PA, 2 vessel runoff via AT/peroneal, PT occluded.   NM GATED MYOVIEW  ([facility] HX)  08/2018   Small sized mild severity defect in the apical anterior wall concerning for ischemia.  INTERMEDIATE RISK. (Appears to be no change from 2009)   NM MYOVIEW  LTD  07/2008   ([facility]): A) 07/2008: Minimal reversible defect in the apex with possible ischemia.  Likely represents artifact.  EF 59% -> Cath: 80% PDA, 60% PAV and distal LAD dZ); B) 08/2010: Lexiscan  - > ischemia the Ant & Inf Apex.  EF 64%.(Med Rx); C) Jan 2013: + Apical Ischemia - unchanged; D) Jan 2016: EF 76%. No Ischemia /Infarct.   ORIF ELBOW FRACTURE Left 09/28/2023   Procedure: LEFT Open Reduction Internal fixation (ORIF), fracture, elbow/olecranon;  Surgeon: Genelle Standing, MD;  Location: [facility] ORS;  Service: Orthopedics;  Laterality: Left;   TRANSTHORACIC ECHOCARDIOGRAM  11/2009   ([facility]) A) December 2010, 2D echo: EF >55%, mild concentric LVH, diastolic dysfunction Gr. I, LVIDd 4.2 cm, LVIDs 2.3 cm, mild TR (peak RVP 28 mmHg).; B) November 2011, 2D echo: EF >55%, mild concentric LVH, diastolic dysfunction Gr. I, LVIDd 4.3 cm, LVIDs 1.7 cm, mild PR (peak RVP 25 mmHg).; C) 12/2014 Echo: EF >55%, mild LVH, DD gr. I, LAE 3.9cm, Moderate TR (PRVP 35 mmHg)   TRANSTHORACIC ECHOCARDIOGRAM  06/13/2020   [facility]: EF 60-65%.  Normal LV function.  Indeterminate diastolic pressures.  Mild RV enlargement with severely elevated PAP estimated 95. moderate biatrial dilation.  Moderate to severe MR.  Moderate to severe TR.  Mild AS.    Social History   Socioeconomic History   Marital status: Widowed    Spouse name: Not on file   Number of children: 3   Years of  education: Not on file   Highest education level: Not on file  Occupational History   Not on file  Tobacco Use   Smoking status: Every Day    Current packs/day: 1.00    Average packs/day: 1 pack/day for 46.0 years (46.0 ttl pk-yrs)    Types: Cigarettes   Smokeless tobacco: Never   Tobacco comments:    0.5PPD 02/17/2022  Vaping Use   Vaping status: Never Used  Substance and Sexual Activity   Alcohol use: No   Drug use: Yes    Types: Marijuana    Comment: occasional   Sexual activity: Not Currently  Other Topics Concern   Not on file  Social History Narrative   She is a widowed mother of at least 3 daughters.  Unfortunately all 3 daughters have deceased.        2015: Her youngest daughter and son-in-law were run  over by an [place] train   2018: Middle daughter died of complications of DKA   Apr 15, 2021: Oldest daughter died cancer after being on palliative care.   She also had a dog that died after being with her for 18 years.   Social Drivers of Corporate investment banker Strain: Low Risk  (06/03/2022)   Received from [facility]   Overall Financial Resource Strain (CARDIA)    Difficulty of Paying Living Expenses: Not hard at all  Food Insecurity: No Food Insecurity (03/18/2024)   Hunger Vital Sign    Worried About Running Out of Food in the Last Year: Never true    Ran Out of Food in the Last Year: Never true  Transportation Needs: Unmet Transportation Needs (03/18/2024)   PRAPARE - Administrator, Civil Service (Medical): Yes    Lack of Transportation (Non-Medical): No  Physical Activity: Inactive (11/09/2020)   Received from [facility]   Exercise Vital Sign    On average, how many days per week do you engage in moderate to strenuous exercise (like a brisk walk)?: 0 days    On average, how many minutes do you engage in exercise at this level?: 0 min  Stress: Stress Concern Present (11/09/2020)   Received from [facility] - Occupational Stress Questionnaire    Feeling of Stress : Rather much  Social Connections: Socially Isolated (03/18/2024)   Social Connection and Isolation Panel    Frequency of Communication with Friends and Family: More than three times a week    Frequency of Social Gatherings with Friends and Family: Never    Attends Religious Services: Never    Database administrator or Organizations: No    Attends Banker Meetings: Never    Marital Status: Widowed  Intimate Partner Violence: Not At Risk (03/18/2024)   Humiliation, Afraid, Rape, and Kick questionnaire    Fear of Current or Ex-Partner: No    Emotionally Abused: No    Physically Abused: No    Sexually Abused: No    Family History  Problem Relation Age of Onset   Heart failure Mother    Heart attack Mother        mother died at 66 of an MI   Heart attack Father        father died at 36 of an MI   Heart attack Brother        Brother had an MI in his 55s     Current Outpatient Medications:    albuterol  (PROVENTIL ) (2.5 MG/3ML) 0.083% nebulizer solution, Take 3 mLs (2.5 mg total) by nebulization every 6 (six) hours as needed for wheezing or shortness of breath., Disp: 75 mL, Rfl: 0   albuterol  (VENTOLIN  HFA) 108 (90 Base) MCG/ACT inhaler, Inhale 2 puffs into the lungs every 4 (four) hours as needed for wheezing or shortness of breath., Disp: , Rfl:    alendronate (FOSAMAX) 70 MG tablet, Take 70 mg by mouth once a week., Disp: , Rfl:    alprazolam  (XANAX ) 2 MG tablet, Take 2 mg by mouth 2 (two) times daily. , Disp: , Rfl:    atorvastatin  (LIPITOR ) 80 MG tablet, Take 80 mg by mouth daily., Disp: , Rfl:    budesonide -formoterol  (SYMBICORT) 160-4.5 MCG/ACT inhaler, Inhale 2 puffs into the lungs 2 (two) times daily., Disp: , Rfl:    busPIRone  (BUSPAR ) 10 MG tablet, Take 10  mg by mouth 2 (two) times daily. , Disp: , Rfl: 2   carvedilol  (COREG ) 6.25 MG tablet, Take  6.25 mg by mouth 2 (two) times daily., Disp: , Rfl:    citalopram  (CELEXA ) 40 MG tablet, Take 40 mg by mouth daily., Disp: , Rfl:    DULoxetine  (CYMBALTA ) 60 MG capsule, Take 60 mg by mouth daily., Disp: , Rfl:    furosemide  (LASIX ) 40 MG tablet, Take 40 mg by mouth every morning., Disp: , Rfl:    gabapentin  (NEURONTIN ) 400 MG capsule, Take 400 mg by mouth 3 (three) times daily., Disp: , Rfl:    isosorbide  mononitrate (IMDUR ) 30 MG 24 hr tablet, Take 30 mg by mouth daily., Disp: , Rfl:    levofloxacin  (LEVAQUIN ) 750 MG tablet, Take 1 tablet (750 mg total) by mouth every other day., Disp: 4 tablet, Rfl: 0   nitroGLYCERIN  (NITROSTAT ) 0.4 MG SL tablet, Place 0.4 mg under the tongue every 5 (five) minutes as needed for chest pain., Disp: , Rfl:    oxyCODONE -acetaminophen  (PERCOCET) 7.5-325 MG tablet, Take 1 tablet by mouth every 4 (four) hours as needed for severe pain (pain score 7-10)., Disp: 12 tablet, Rfl: 0 No current facility-administered medications for this visit.  Facility-Administered Medications Ordered in Other Visits:    alteplase  (CATHFLO ACTIVASE ) injection 2 mg, 2 mg, Intracatheter, Once PRN, Jaison Petraglia C, MD   heparin  lock flush 100 unit/mL, 500 Units, Intracatheter, Once PRN, Falynn Ailey C, MD   heparin  lock flush 100 unit/mL, 250 Units, Intracatheter, Once PRN, Zafira Munos C, MD   sodium chloride  flush (NS) 0.9 % injection 10 mL, 10 mL, Intracatheter, Once PRN, Sidney Silberman C, MD   sodium chloride  flush (NS) 0.9 % injection 3 mL, 3 mL, Intracatheter, Once PRN, Melanee Annah BROCKS, MD  Physical exam:  Vitals:   09/12/24 1528  BP: (!) 142/90  Pulse: 65  Resp: 17  Temp: 98.1 F (36.7 C)  TempSrc: Oral  SpO2: 95%  Weight: 137 lb (62.1 kg)   Physical Exam Constitutional:      Comments: Sitting in a wheelchair.  Appears fatigued  Cardiovascular:     Rate and Rhythm: Normal rate and regular rhythm.     Heart sounds: Normal heart sounds.  Pulmonary:     Effort: Pulmonary  effort is normal.     Breath sounds: Normal breath sounds.  Abdominal:     General: Bowel sounds are normal.     Palpations: Abdomen is soft.  Skin:    General: Skin is warm and dry.  Neurological:     Mental Status: She is alert and oriented to person, place, and time.      I have personally reviewed labs listed below:    Latest Ref Rng & Units 03/21/2024    4:14 AM  CMP  Glucose 70 - 99 mg/dL 79   BUN 8 - 23 mg/dL 25   Creatinine 9.55 - 1.00 mg/dL 8.44   Sodium 864 - 854 mmol/L 134   Potassium 3.5 - 5.1 mmol/L 3.5   Chloride 98 - 111 mmol/L 100   CO2 22 - 32 mmol/L 23   Calcium  8.9 - 10.3 mg/dL 8.1       Latest Ref Rng & Units 09/12/2024    3:04 PM  CBC  WBC 4.0 - 10.5 K/uL 9.0   Hemoglobin 12.0 - 15.0 g/dL 86.7   Hematocrit 63.9 - 46.0 % 38.5   Platelets 150 - 400 K/uL 240  Assessment and plan- Patient is a 68 y.o. female here for routine follow-up of chronic normocytic anemia with a component of iron  deficiency  Patient's Hemoglobin runs around 10 and was in that range until November 2024 when it dropped to 8-9 range in April 2025.  She received 4 doses of IV iron  between April and May 2025.  Today her hemoglobin has improved remarkably and is up to 13.2/38.5.  Iron  studies are currently pending.  We will let her know if she still Needs IV iron .  CBC ferritin and iron  studies in 3 and 6 months and I will see her back in 6 months  Dysuria: I am checking urinalysis today  With regards to her chronic complaints including back pain cough and shortness of breath she will need to speak to her PCP about this.  She has a visit coming up in 10 days time   Visit Diagnosis 1. Iron  deficiency anemia, unspecified iron  deficiency anemia type      Dr. Annah Skene, MD, MPH [facility] at [facility] [phone] 09/12/2024 3:12 PM

## 2024-09-12 NOTE — Progress Notes (Signed)
 Concerns today SOB, cough with phelm, headaches, dizziness and urine odor frequency

## 2024-09-13 ENCOUNTER — Ambulatory Visit: Payer: Self-pay | Admitting: Oncology

## 2024-09-13 MED ORDER — SULFAMETHOXAZOLE-TRIMETHOPRIM 800-160 MG PO TABS: 1.0000 | ORAL_TABLET | Freq: Two times a day (BID) | ORAL | 0 refills | Status: AC

## 2024-09-13 NOTE — Telephone Encounter (Signed)
-----   Message from Annah JAYSON Skene sent at 09/13/2024  8:46 AM EDT ----- Please send her a prescription for Bactrim DS for 5 days twice daily ----- Message ----- From: Rebecka, Lab In [place] Sent: 09/12/2024   3:15 PM EDT To: Annah JAYSON Skene, MD

## 2024-09-13 NOTE — Telephone Encounter (Signed)
 Per Dr. Melanee Please send her a prescription for Bactrim DS for 5 days twice daily.  Script sent to [facility] off [address] in [place].  Attempted to contact patient twice, unable to leave a voice message.  Patient is not set up on my chart; will try calling again later.

## 2024-09-14 LAB — URINE CULTURE: Culture: 40000 — AB

## 2024-09-15 NOTE — Telephone Encounter (Signed)
 Outbound call to pharmacy to inquire if patient picked up Bactrim script; patient hasn't picked up prescription yet.

## 2024-09-16 ENCOUNTER — Encounter: Payer: Self-pay | Admitting: Oncology

## 2024-09-16 NOTE — Telephone Encounter (Signed)
-----   Message from Jereld Phenix, RN sent at 09/15/2024 10:03 AM EDT -----   ----- Message ----- From: Phenix Jereld, RN Sent: 09/13/2024  10:02 AM EDT To: Jereld Phenix, RN  ----- Message from Jereld Phenix, RN sent at 09/13/2024 10:02 AM EDT -----   ----- Message ----- From: Melanee Annah BROCKS, MD Sent: 09/13/2024   8:46 AM EDT To: Jereld Phenix, RN  Please send her a prescription for Bactrim DS for 5 days twice daily ----- Message ----- From: Interface, Lab In [place] Sent: 09/12/2024   3:15 PM EDT To: Annah BROCKS Melanee, MD

## 2024-09-19 ENCOUNTER — Ambulatory Visit

## 2024-09-26 ENCOUNTER — Ambulatory Visit: Admitting: Cardiology

## 2024-10-07 ENCOUNTER — Encounter: Payer: Self-pay | Admitting: Oncology

## 2024-10-07 ENCOUNTER — Ambulatory Visit: Attending: Cardiology | Admitting: Cardiology

## 2024-11-10 ENCOUNTER — Ambulatory Visit: Attending: Cardiology

## 2024-11-16 ENCOUNTER — Other Ambulatory Visit: Payer: Self-pay | Admitting: Pediatrics

## 2024-11-16 DIAGNOSIS — Z1231 Encounter for screening mammogram for malignant neoplasm of breast: Secondary | ICD-10-CM

## 2024-11-16 DIAGNOSIS — M810 Age-related osteoporosis without current pathological fracture: Secondary | ICD-10-CM

## 2024-12-12 ENCOUNTER — Inpatient Hospital Stay: Attending: Oncology

## 2024-12-12 ENCOUNTER — Other Ambulatory Visit

## 2025-01-04 ENCOUNTER — Emergency Department: Admission: EM | Admit: 2025-01-04 | Discharge: 2025-01-04 | Disposition: A | Discharge status: Home / Self Care

## 2025-01-04 ENCOUNTER — Other Ambulatory Visit: Payer: Self-pay

## 2025-01-04 DIAGNOSIS — I2510 Atherosclerotic heart disease of native coronary artery without angina pectoris: Secondary | ICD-10-CM | POA: Diagnosis not present

## 2025-01-04 DIAGNOSIS — J449 Chronic obstructive pulmonary disease, unspecified: Secondary | ICD-10-CM | POA: Insufficient documentation

## 2025-01-04 DIAGNOSIS — Z760 Encounter for issue of repeat prescription: Secondary | ICD-10-CM | POA: Insufficient documentation

## 2025-01-04 DIAGNOSIS — I10 Essential (primary) hypertension: Secondary | ICD-10-CM | POA: Diagnosis not present

## 2025-01-04 MED ORDER — CLONAZEPAM 0.5 MG PO TABS
0.5000 mg | ORAL_TABLET | Freq: Once | ORAL | Status: AC
  Administered 2025-01-04: 0.5 mg via ORAL
  Filled 2025-01-04: qty 1

## 2025-01-04 NOTE — Telephone Encounter (Signed)
 She was a no-show for an appointment with me yesterday.  Her psychiatrist should be filling the clonazepam  and looks like they have been filling. Clonazepam  denied   Needs to go to ER for not feeling right

## 2025-01-04 NOTE — Telephone Encounter (Signed)
 Action Needed from Provider Reason for conversation: Triage for Medication Refill Dizziness  Pt reports not feeling right She says her eyes are not focusing and she is shaky Pt is home alone and she says that she is afraid to shower while there alone   Offered to call 911 for pt, she declined stating she would call her granson to come now If he is unable to come now she will call 911  Advised pt to callback if she has difficulty getting someone to the house soon. Pt verbalized understanding   Protocol used: Dizziness-A-OH    Sounds like a life-threatening emergency to the triager   Disposition: Disposition     Call EMS 911 Now       What would patient/caregiver have done without intervention? Home Care   Patient/Caregiver understands and will follow disposition? Yes, but will wait     .   Actions Taken:  Triage completed; care advice given per protocol. Call back parameters given and caller advised of 24 hour nurse triage. Instructed to seek immediate medical attention if new symptoms develop, current symptoms worsen or if you become increasingly concerned. Patient verbalized understanding. Patient advised to call 911 if having an emergency or have someone assist with transportation to the nearest Emergency Room..       Patient Engagement Center Documentation Additional Information   Contacts     Contact Date/Time Type Contact Phone/Fax Modification Date/Time   01/04/25 13:02 EST Phone (Incoming) Traci Duran, Traci Duran [place] (Self) [phone] (M)    01/04/25 13:10 EST Phone (Incoming) Traci Duran, Traci Duran [place] (Self) [phone] BENNIE)       Recent Office Visits     Date Provider Department Visit Type Primary Dx   10/11/2024 Delfina Darice Nice, MD [facility] [place] Office Visit Medicare annual wellness visit, subsequent   10/11/2024 Delfina Darice Nice, MD [facility] [place] Office Visit Dysuria   07/18/2024 Delfina Darice Nice, MD [facility]  [place] Office Visit Anxiety      Last video visit and provider: Visit date not found  Last telephone visit and provider: Visit date not found   Future Appointments   This patient does not currently have any appointments scheduled.    Reason for Disposition  Sounds like a life-threatening emergency to the triager  Additional Information  Negative: Shock suspected (e.g., cold/pale/clammy skin, too weak to stand)  Negative: Difficult to awaken or acting confused (e.g., disoriented, slurred speech)  Negative: Fainted, and still feels dizzy afterwards  Negative: SEVERE difficulty breathing (e.g., struggling for each breath, speaks in single words)  Negative: Overdose (accidental or intentional) of medications  Negative: New neurologic deficit that is present now: * Weakness of the face, arm, or leg on one side of the body * Numbness of the face, arm, or leg on one side of the body * Loss of speech or garbled speech  Negative: Heart beating < 50 beats per minute OR > 140 beats per minute  Protocols used: Dizziness-A-OH

## 2025-01-04 NOTE — Discharge Instructions (Addendum)
 Please follow-up with your psychiatrist for a refill of the Klonopin .  This medication cannot be refilled by an emergency department provider as it is a controlled substance.

## 2025-01-04 NOTE — ED Provider Notes (Signed)
 "  [facility] Provider Note    Event Date/Time   First MD Initiated Contact with Patient 01/04/25 1527     (approximate)   History   Medication Refill   HPI  ADDELYNN BATTE is a 69 y.o. female with PMH of CAD, hypertension, COPD, depression, anxiety presents for medication refill on her Klonopin .  Patient states that she has been waiting 6 days for her prescription to be refilled.  Patient states that she has not missed any appointments.     Physical Exam   Triage Vital Signs: ED Triage Vitals [01/04/25 1452]  Encounter Vitals Group     BP 134/83     Girls Systolic BP Percentile      Girls Diastolic BP Percentile      Boys Systolic BP Percentile      Boys Diastolic BP Percentile      Pulse Rate 65     Resp 16     Temp 98 F (36.7 C)     Temp src      SpO2 99 %     Weight 135 lb (61.2 kg)     Height 5' 7 (1.702 m)     Head Circumference      Peak Flow      Pain Score 0     Pain Loc      Pain Education      Exclude from Growth Chart     Most recent vital signs: Vitals:   01/04/25 1452  BP: 134/83  Pulse: 65  Resp: 16  Temp: 98 F (36.7 C)  SpO2: 99%   General: Awake, no distress.  CV:  Good peripheral perfusion.  Resp:  Normal effort.  Abd:  No distention.  Other:     ED Results / Procedures / Treatments   Labs (all labs ordered are listed, but only abnormal results are displayed) Labs Reviewed - No data to display   PROCEDURES:  Critical Care performed: No  Procedures   MEDICATIONS ORDERED IN ED: Medications - No data to display   IMPRESSION / MDM / ASSESSMENT AND PLAN / ED COURSE  I reviewed the triage vital signs and the nursing notes.                             69 year old female presents for medication refill on the Klonopin .  Vital signs stable patient NAD on exam.  Differential diagnosis includes, but is not limited to, medication refill.  Patient's presentation is most consistent with acute,  uncomplicated illness.  I reviewed patient's chart.  She reached out to her primary care provider for a refill on the clonazepam  that is typically refilled by her psychiatrist.  PCP stated the patient was a no-show for an appointment with her yesterday.  They declined to refill the clonazepam .  They advised patient to come to the emergency department for not feeling right.  I reviewed patient's PDMP.  The prescription for Klonopin  was filled on 12/23/2024 and she was given a 30-day supply.  I explained to the patient that I cannot refill this medication for her and that she will need to see her psychiatrist for refill.   Patient reports that she was concerned about having a seizure because a family member had a seizure when they stopped taking their medication.  Patient has no history of seizures. I will give her a single dose of the medication while in the emergency department.  Patient was agreeable to this.  Patient discharged in stable condition.      FINAL CLINICAL IMPRESSION(S) / ED DIAGNOSES   Final diagnoses:  Medication refill     Rx / DC Orders   ED Discharge Orders     None        Note:  This document was prepared using Dragon voice recognition software and may include unintentional dictation errors.   Cleaster Tinnie LABOR, PA-C 01/04/25 1541    Waymond Lorelle Cummins, MD 01/07/25 1515  "

## 2025-01-04 NOTE — Telephone Encounter (Addendum)
 Action Needed from Provider Reason for Conversation:Rx Medication for clonazePAM  (KLONOPIN ) 0.5 MG tablet ,refill not prescribed by PCP.  Patient is requesting that their PCP refill this. .   Actions Taken: Routed to the provider for review to deny or approve as appropriate..   Patient Engagement Center Documentation Additional Information  Contacts     Contact Date/Time Type Contact Phone/Fax Modification Date/Time   01/04/25 13:02 EST Phone (Incoming) Ajaya, [place] (Self) [phone] (M)    01/04/25 13:10 EST Phone (Incoming) Hennessey, Cantrell [place] (Self) [phone] BENNIE)       Recent Office Visits     Date Provider Department Visit Type Primary Dx   10/11/2024 Delfina Darice Nice, MD [facility] Office Visit Medicare annual wellness visit, subsequent   10/11/2024 Delfina Darice Nice, MD [facility] Office Visit Dysuria   07/18/2024 Delfina Darice Nice, MD [facility] Office Visit Anxiety      Last video visit and provider: Visit date not found  Last telephone visit and provider: Visit date not found   Future Appointments   This patient does not currently have any appointments scheduled.

## 2025-01-04 NOTE — ED Triage Notes (Addendum)
 Pt states need klonopin  refilled. Missed appt this week for refill. Takes for seizures, has not had any reported seizures ever.

## 2025-01-04 NOTE — Telephone Encounter (Addendum)
 Tried to call pt [facility] full Per Dr Delfina: She was a no-show for an appointment with me yesterday.  Her psychiatrist should be filling the clonazepam  and looks like they have been filling. Clonazepam  denied    Needs to go to ER for not feeling right

## 2025-01-04 NOTE — ED Notes (Signed)
 Pt verbalizes understanding of discharge instructions. Opportunity for questioning and answers were provided. Pt discharged from ED with family.

## 2025-03-13 ENCOUNTER — Other Ambulatory Visit

## 2025-03-13 ENCOUNTER — Ambulatory Visit: Admitting: Oncology
